# Patient Record
Sex: Female | Born: 1945 | Race: White | Hispanic: No | Smoking: Never smoker
Health system: Southern US, Community
[De-identification: ages and names within clinical notes are randomized; demographics above are authoritative.]

## PROBLEM LIST (undated history)

## (undated) DIAGNOSIS — F411 Generalized anxiety disorder: Secondary | ICD-10-CM

## (undated) DIAGNOSIS — F4321 Adjustment disorder with depressed mood: Secondary | ICD-10-CM

## (undated) DIAGNOSIS — R51 Headache: Secondary | ICD-10-CM

## (undated) DIAGNOSIS — G20A1 Parkinson's disease without dyskinesia, without mention of fluctuations: Secondary | ICD-10-CM

## (undated) DIAGNOSIS — F329 Major depressive disorder, single episode, unspecified: Secondary | ICD-10-CM

## (undated) DIAGNOSIS — G510 Bell's palsy: Secondary | ICD-10-CM

## (undated) DIAGNOSIS — G20 Parkinson's disease: Secondary | ICD-10-CM

## (undated) DIAGNOSIS — R2689 Other abnormalities of gait and mobility: Secondary | ICD-10-CM

## (undated) DIAGNOSIS — R569 Unspecified convulsions: Secondary | ICD-10-CM

## (undated) DIAGNOSIS — F419 Anxiety disorder, unspecified: Secondary | ICD-10-CM

## (undated) DIAGNOSIS — E782 Mixed hyperlipidemia: Secondary | ICD-10-CM

## (undated) DIAGNOSIS — I10 Essential (primary) hypertension: Secondary | ICD-10-CM

## (undated) DIAGNOSIS — E119 Type 2 diabetes mellitus without complications: Secondary | ICD-10-CM

## (undated) DIAGNOSIS — G4752 REM sleep behavior disorder: Secondary | ICD-10-CM

## (undated) DIAGNOSIS — E8881 Metabolic syndrome: Secondary | ICD-10-CM

## (undated) DIAGNOSIS — R55 Syncope and collapse: Secondary | ICD-10-CM

## (undated) DIAGNOSIS — Z8619 Personal history of other infectious and parasitic diseases: Secondary | ICD-10-CM

## (undated) DIAGNOSIS — Z8672 Personal history of thrombophlebitis: Secondary | ICD-10-CM

## (undated) DIAGNOSIS — I82402 Acute embolism and thrombosis of unspecified deep veins of left lower extremity: Secondary | ICD-10-CM

## (undated) DIAGNOSIS — R439 Unspecified disturbances of smell and taste: Secondary | ICD-10-CM

## (undated) DIAGNOSIS — F3289 Other specified depressive episodes: Secondary | ICD-10-CM

## (undated) HISTORY — DX: Metabolic syndrome: E88.81

## (undated) HISTORY — DX: REM sleep behavior disorder: G47.52

## (undated) HISTORY — PX: CATARACT EXTRACTION W/ INTRAOCULAR LENS  IMPLANT, BILATERAL: SHX1307

## (undated) HISTORY — DX: Type 2 diabetes mellitus without complications: E11.9

## (undated) HISTORY — DX: Metabolic syndrome: E88.810

## (undated) HISTORY — DX: Bell's palsy: G51.0

## (undated) HISTORY — PX: FRACTURE SURGERY: SHX138

## (undated) HISTORY — DX: Generalized anxiety disorder: F41.1

## (undated) HISTORY — DX: Morbid (severe) obesity due to excess calories: E66.01

## (undated) HISTORY — DX: Major depressive disorder, single episode, unspecified: F32.9

## (undated) HISTORY — DX: Adjustment disorder with depressed mood: F43.21

## (undated) HISTORY — DX: Essential (primary) hypertension: I10

## (undated) HISTORY — DX: Other specified depressive episodes: F32.89

## (undated) HISTORY — PX: TUBAL LIGATION: SHX77

## (undated) HISTORY — DX: Personal history of thrombophlebitis: Z86.72

## (undated) HISTORY — DX: Parkinson's disease without dyskinesia, without mention of fluctuations: G20.A1

## (undated) HISTORY — DX: Mixed hyperlipidemia: E78.2

---

## 1968-07-26 HISTORY — PX: TONSILLECTOMY AND ADENOIDECTOMY: SUR1326

## 1995-11-24 HISTORY — PX: OTHER SURGICAL HISTORY: SHX169

## 1998-04-21 ENCOUNTER — Encounter: Payer: Self-pay | Admitting: Family Medicine

## 1998-04-21 LAB — CONVERTED CEMR LAB: Pap Smear: NORMAL

## 1998-08-26 HISTORY — PX: SEPTOPLASTY: SUR1290

## 1998-09-08 ENCOUNTER — Ambulatory Visit (HOSPITAL_BASED_OUTPATIENT_CLINIC_OR_DEPARTMENT_OTHER): Admission: RE | Admit: 1998-09-08 | Discharge: 1998-09-08 | Payer: Self-pay | Admitting: *Deleted

## 1998-12-25 HISTORY — PX: BREAST CYST ASPIRATION: SHX578

## 1998-12-31 ENCOUNTER — Ambulatory Visit (HOSPITAL_COMMUNITY): Admission: RE | Admit: 1998-12-31 | Discharge: 1998-12-31 | Payer: Self-pay | Admitting: General Surgery

## 1999-11-20 ENCOUNTER — Encounter: Payer: Self-pay | Admitting: General Surgery

## 1999-11-20 ENCOUNTER — Encounter: Admission: RE | Admit: 1999-11-20 | Discharge: 1999-11-20 | Payer: Self-pay | Admitting: General Surgery

## 1999-11-26 ENCOUNTER — Encounter: Admission: RE | Admit: 1999-11-26 | Discharge: 1999-11-26 | Payer: Self-pay | Admitting: General Surgery

## 1999-11-26 ENCOUNTER — Encounter: Payer: Self-pay | Admitting: General Surgery

## 2000-05-19 ENCOUNTER — Ambulatory Visit (HOSPITAL_BASED_OUTPATIENT_CLINIC_OR_DEPARTMENT_OTHER): Admission: RE | Admit: 2000-05-19 | Discharge: 2000-05-19 | Payer: Self-pay | Admitting: General Surgery

## 2000-05-19 ENCOUNTER — Encounter (INDEPENDENT_AMBULATORY_CARE_PROVIDER_SITE_OTHER): Payer: Self-pay | Admitting: Specialist

## 2000-05-20 HISTORY — PX: BREAST BIOPSY: SHX20

## 2000-07-22 ENCOUNTER — Ambulatory Visit (HOSPITAL_BASED_OUTPATIENT_CLINIC_OR_DEPARTMENT_OTHER): Admission: RE | Admit: 2000-07-22 | Discharge: 2000-07-22 | Payer: Self-pay | Admitting: Pulmonary Disease

## 2000-11-21 ENCOUNTER — Encounter: Payer: Self-pay | Admitting: General Surgery

## 2000-11-21 ENCOUNTER — Encounter: Admission: RE | Admit: 2000-11-21 | Discharge: 2000-11-21 | Payer: Self-pay | Admitting: General Surgery

## 2001-06-15 ENCOUNTER — Encounter: Admission: RE | Admit: 2001-06-15 | Discharge: 2001-06-15 | Payer: Self-pay | Admitting: Otolaryngology

## 2001-06-15 ENCOUNTER — Encounter: Payer: Self-pay | Admitting: Otolaryngology

## 2001-09-28 ENCOUNTER — Encounter: Admission: RE | Admit: 2001-09-28 | Discharge: 2001-09-28 | Payer: Self-pay | Admitting: Family Medicine

## 2001-09-28 ENCOUNTER — Encounter: Payer: Self-pay | Admitting: Family Medicine

## 2001-10-06 ENCOUNTER — Observation Stay (HOSPITAL_COMMUNITY): Admission: EM | Admit: 2001-10-06 | Discharge: 2001-10-06 | Payer: Self-pay

## 2001-11-27 ENCOUNTER — Encounter: Admission: RE | Admit: 2001-11-27 | Discharge: 2001-11-27 | Payer: Self-pay | Admitting: Family Medicine

## 2001-11-27 ENCOUNTER — Encounter: Payer: Self-pay | Admitting: Family Medicine

## 2001-11-29 ENCOUNTER — Encounter: Admission: RE | Admit: 2001-11-29 | Discharge: 2001-11-29 | Payer: Self-pay | Admitting: Family Medicine

## 2001-11-29 ENCOUNTER — Encounter: Payer: Self-pay | Admitting: Family Medicine

## 2002-03-15 ENCOUNTER — Encounter: Payer: Self-pay | Admitting: Family Medicine

## 2002-03-15 ENCOUNTER — Ambulatory Visit (HOSPITAL_COMMUNITY): Admission: RE | Admit: 2002-03-15 | Discharge: 2002-03-15 | Payer: Self-pay | Admitting: Family Medicine

## 2002-07-26 HISTORY — PX: CARDIAC CATHETERIZATION: SHX172

## 2002-11-29 ENCOUNTER — Encounter: Payer: Self-pay | Admitting: Family Medicine

## 2002-11-29 ENCOUNTER — Encounter: Admission: RE | Admit: 2002-11-29 | Discharge: 2002-11-29 | Payer: Self-pay | Admitting: Family Medicine

## 2002-12-10 ENCOUNTER — Encounter: Admission: RE | Admit: 2002-12-10 | Discharge: 2002-12-10 | Payer: Self-pay | Admitting: Family Medicine

## 2002-12-10 ENCOUNTER — Encounter: Payer: Self-pay | Admitting: Family Medicine

## 2002-12-12 ENCOUNTER — Ambulatory Visit (HOSPITAL_BASED_OUTPATIENT_CLINIC_OR_DEPARTMENT_OTHER): Admission: RE | Admit: 2002-12-12 | Discharge: 2002-12-12 | Payer: Self-pay | Admitting: Orthopedic Surgery

## 2002-12-12 HISTORY — PX: KNEE ARTHROSCOPY: SUR90

## 2003-02-10 ENCOUNTER — Encounter: Payer: Self-pay | Admitting: Emergency Medicine

## 2003-02-10 ENCOUNTER — Encounter: Payer: Self-pay | Admitting: Internal Medicine

## 2003-02-10 ENCOUNTER — Inpatient Hospital Stay (HOSPITAL_COMMUNITY): Admission: EM | Admit: 2003-02-10 | Discharge: 2003-02-15 | Payer: Self-pay | Admitting: Emergency Medicine

## 2003-02-11 ENCOUNTER — Encounter: Payer: Self-pay | Admitting: Internal Medicine

## 2003-02-12 ENCOUNTER — Encounter: Payer: Self-pay | Admitting: Internal Medicine

## 2003-02-27 ENCOUNTER — Ambulatory Visit (HOSPITAL_COMMUNITY): Admission: RE | Admit: 2003-02-27 | Discharge: 2003-02-27 | Payer: Self-pay | Admitting: Internal Medicine

## 2003-03-01 ENCOUNTER — Ambulatory Visit (HOSPITAL_COMMUNITY): Admission: RE | Admit: 2003-03-01 | Discharge: 2003-03-01 | Payer: Self-pay | Admitting: Family Medicine

## 2003-03-01 ENCOUNTER — Inpatient Hospital Stay (HOSPITAL_COMMUNITY): Admission: AD | Admit: 2003-03-01 | Discharge: 2003-03-04 | Payer: Self-pay | Admitting: Family Medicine

## 2003-03-03 ENCOUNTER — Encounter: Payer: Self-pay | Admitting: Family Medicine

## 2003-03-13 ENCOUNTER — Emergency Department (HOSPITAL_COMMUNITY): Admission: EM | Admit: 2003-03-13 | Discharge: 2003-03-14 | Payer: Self-pay | Admitting: Emergency Medicine

## 2003-03-14 ENCOUNTER — Encounter: Payer: Self-pay | Admitting: Emergency Medicine

## 2003-05-27 ENCOUNTER — Encounter: Payer: Self-pay | Admitting: Family Medicine

## 2003-05-27 LAB — CONVERTED CEMR LAB
Hgb A1c MFr Bld: 5.8 %
Microalbumin U total vol: 4.7 mg/L
Pap Smear: NORMAL

## 2003-06-24 ENCOUNTER — Other Ambulatory Visit: Admission: RE | Admit: 2003-06-24 | Discharge: 2003-06-24 | Payer: Self-pay | Admitting: Family Medicine

## 2003-12-11 ENCOUNTER — Encounter: Admission: RE | Admit: 2003-12-11 | Discharge: 2003-12-11 | Payer: Self-pay | Admitting: Family Medicine

## 2004-05-26 ENCOUNTER — Encounter: Payer: Self-pay | Admitting: Family Medicine

## 2004-05-26 LAB — CONVERTED CEMR LAB: Microalbumin U total vol: 11.5 mg/L

## 2004-06-01 ENCOUNTER — Ambulatory Visit: Payer: Self-pay | Admitting: Family Medicine

## 2004-06-01 ENCOUNTER — Ambulatory Visit: Payer: Self-pay

## 2004-06-03 ENCOUNTER — Ambulatory Visit: Payer: Self-pay | Admitting: Cardiology

## 2004-06-25 ENCOUNTER — Encounter: Payer: Self-pay | Admitting: Family Medicine

## 2004-06-25 LAB — CONVERTED CEMR LAB: Hgb A1c MFr Bld: 5.9 %

## 2004-07-01 ENCOUNTER — Ambulatory Visit: Payer: Self-pay | Admitting: *Deleted

## 2004-07-06 ENCOUNTER — Ambulatory Visit: Payer: Self-pay | Admitting: Family Medicine

## 2004-07-08 ENCOUNTER — Ambulatory Visit: Payer: Self-pay | Admitting: Family Medicine

## 2004-07-10 ENCOUNTER — Ambulatory Visit: Payer: Self-pay | Admitting: Cardiology

## 2004-07-23 ENCOUNTER — Ambulatory Visit: Payer: Self-pay | Admitting: Cardiovascular Disease

## 2004-08-05 ENCOUNTER — Ambulatory Visit: Payer: Self-pay | Admitting: Internal Medicine

## 2004-08-19 ENCOUNTER — Ambulatory Visit: Payer: Self-pay | Admitting: Cardiology

## 2004-08-25 ENCOUNTER — Ambulatory Visit: Payer: Self-pay | Admitting: Family Medicine

## 2004-09-02 ENCOUNTER — Ambulatory Visit: Payer: Self-pay | Admitting: Internal Medicine

## 2004-09-08 ENCOUNTER — Encounter: Admission: RE | Admit: 2004-09-08 | Discharge: 2004-09-08 | Payer: Self-pay | Admitting: Orthopedic Surgery

## 2004-09-24 ENCOUNTER — Encounter: Admission: RE | Admit: 2004-09-24 | Discharge: 2004-12-23 | Payer: Self-pay | Admitting: Family Medicine

## 2004-10-06 ENCOUNTER — Ambulatory Visit: Payer: Self-pay | Admitting: Cardiology

## 2004-11-11 ENCOUNTER — Ambulatory Visit: Payer: Self-pay | Admitting: Cardiology

## 2004-12-09 ENCOUNTER — Ambulatory Visit: Payer: Self-pay | Admitting: Cardiology

## 2004-12-14 ENCOUNTER — Encounter: Admission: RE | Admit: 2004-12-14 | Discharge: 2004-12-14 | Payer: Self-pay | Admitting: Family Medicine

## 2004-12-30 ENCOUNTER — Ambulatory Visit: Payer: Self-pay | Admitting: Cardiology

## 2005-01-07 ENCOUNTER — Ambulatory Visit: Payer: Self-pay | Admitting: Internal Medicine

## 2005-01-13 ENCOUNTER — Ambulatory Visit (HOSPITAL_COMMUNITY): Admission: RE | Admit: 2005-01-13 | Discharge: 2005-01-13 | Payer: Self-pay | Admitting: Internal Medicine

## 2005-01-13 ENCOUNTER — Ambulatory Visit: Payer: Self-pay | Admitting: Internal Medicine

## 2005-04-14 ENCOUNTER — Ambulatory Visit: Payer: Self-pay | Admitting: Family Medicine

## 2005-04-24 ENCOUNTER — Encounter: Admission: RE | Admit: 2005-04-24 | Discharge: 2005-04-24 | Payer: Self-pay | Admitting: Orthopedic Surgery

## 2005-05-21 ENCOUNTER — Ambulatory Visit: Payer: Self-pay | Admitting: Family Medicine

## 2005-05-26 ENCOUNTER — Ambulatory Visit: Payer: Self-pay | Admitting: Family Medicine

## 2005-05-28 ENCOUNTER — Ambulatory Visit: Payer: Self-pay | Admitting: Family Medicine

## 2005-06-03 ENCOUNTER — Ambulatory Visit: Payer: Self-pay | Admitting: Family Medicine

## 2005-06-08 ENCOUNTER — Other Ambulatory Visit: Payer: Self-pay

## 2005-06-11 ENCOUNTER — Ambulatory Visit: Payer: Self-pay | Admitting: Podiatry

## 2005-07-01 ENCOUNTER — Ambulatory Visit: Payer: Self-pay | Admitting: Family Medicine

## 2005-07-26 ENCOUNTER — Encounter: Payer: Self-pay | Admitting: Family Medicine

## 2005-07-26 LAB — CONVERTED CEMR LAB
Hgb A1c MFr Bld: 5.8 %
Microalbumin U total vol: 5.6 mg/L

## 2005-08-04 ENCOUNTER — Ambulatory Visit: Payer: Self-pay | Admitting: Family Medicine

## 2005-08-10 ENCOUNTER — Ambulatory Visit: Payer: Self-pay | Admitting: Family Medicine

## 2005-08-19 ENCOUNTER — Ambulatory Visit: Payer: Self-pay | Admitting: Family Medicine

## 2005-09-08 ENCOUNTER — Ambulatory Visit: Payer: Self-pay | Admitting: Family Medicine

## 2005-09-10 ENCOUNTER — Ambulatory Visit (HOSPITAL_COMMUNITY): Admission: RE | Admit: 2005-09-10 | Discharge: 2005-09-10 | Payer: Self-pay | Admitting: Urology

## 2005-09-24 ENCOUNTER — Ambulatory Visit: Payer: Self-pay | Admitting: Family Medicine

## 2005-10-15 ENCOUNTER — Ambulatory Visit: Payer: Self-pay | Admitting: Family Medicine

## 2005-10-24 ENCOUNTER — Encounter: Payer: Self-pay | Admitting: Family Medicine

## 2005-10-24 LAB — CONVERTED CEMR LAB: Hgb A1c MFr Bld: 5.9 %

## 2005-11-09 ENCOUNTER — Ambulatory Visit: Payer: Self-pay | Admitting: Family Medicine

## 2005-11-11 ENCOUNTER — Ambulatory Visit: Payer: Self-pay | Admitting: Family Medicine

## 2005-12-29 ENCOUNTER — Ambulatory Visit: Payer: Self-pay | Admitting: Cardiology

## 2005-12-30 ENCOUNTER — Encounter: Admission: RE | Admit: 2005-12-30 | Discharge: 2005-12-30 | Payer: Self-pay | Admitting: Family Medicine

## 2006-01-07 ENCOUNTER — Ambulatory Visit: Payer: Self-pay | Admitting: Family Medicine

## 2006-01-10 ENCOUNTER — Ambulatory Visit: Payer: Self-pay

## 2006-01-23 ENCOUNTER — Encounter: Payer: Self-pay | Admitting: Family Medicine

## 2006-01-23 LAB — CONVERTED CEMR LAB: Hgb A1c MFr Bld: 5.6 %

## 2006-02-08 ENCOUNTER — Ambulatory Visit: Payer: Self-pay | Admitting: Family Medicine

## 2006-02-09 HISTORY — PX: FRACTURE SURGERY: SHX138

## 2006-02-10 ENCOUNTER — Ambulatory Visit: Payer: Self-pay | Admitting: Family Medicine

## 2006-04-25 ENCOUNTER — Encounter: Payer: Self-pay | Admitting: Family Medicine

## 2006-04-25 LAB — CONVERTED CEMR LAB: Hgb A1c MFr Bld: 5.7 %

## 2006-05-06 ENCOUNTER — Ambulatory Visit: Payer: Self-pay | Admitting: Family Medicine

## 2006-05-10 ENCOUNTER — Ambulatory Visit: Payer: Self-pay | Admitting: Family Medicine

## 2006-05-12 ENCOUNTER — Ambulatory Visit: Payer: Self-pay | Admitting: Family Medicine

## 2006-07-22 ENCOUNTER — Ambulatory Visit (HOSPITAL_COMMUNITY): Admission: RE | Admit: 2006-07-22 | Discharge: 2006-07-22 | Payer: Self-pay | Admitting: General Surgery

## 2006-07-26 ENCOUNTER — Encounter: Payer: Self-pay | Admitting: Family Medicine

## 2006-07-26 HISTORY — PX: LAPAROSCOPIC GASTRIC BANDING: SHX1100

## 2006-07-26 LAB — CONVERTED CEMR LAB
Hgb A1c MFr Bld: 5.8 %
Microalbumin U total vol: 8.9 mg/L

## 2006-07-27 ENCOUNTER — Encounter: Admission: RE | Admit: 2006-07-27 | Discharge: 2006-10-25 | Payer: Self-pay | Admitting: General Surgery

## 2006-07-29 ENCOUNTER — Ambulatory Visit (HOSPITAL_COMMUNITY): Admission: RE | Admit: 2006-07-29 | Discharge: 2006-07-29 | Payer: Self-pay | Admitting: General Surgery

## 2006-08-11 ENCOUNTER — Ambulatory Visit: Payer: Self-pay | Admitting: Family Medicine

## 2006-08-11 LAB — CONVERTED CEMR LAB
ALT: 17 units/L (ref 0–40)
AST: 18 units/L (ref 0–37)
Albumin: 3.5 g/dL (ref 3.5–5.2)
Alkaline Phosphatase: 71 units/L (ref 39–117)
BUN: 18 mg/dL (ref 6–23)
CO2: 33 meq/L — ABNORMAL HIGH (ref 19–32)
Calcium: 10.1 mg/dL (ref 8.4–10.5)
Chloride: 102 meq/L (ref 96–112)
Cholesterol: 138 mg/dL (ref 0–200)
Creatinine, Ser: 0.6 mg/dL (ref 0.4–1.2)
Creatinine,U: 102.6 mg/dL
GFR calc Af Amer: 131 mL/min
GFR calc non Af Amer: 108 mL/min
Glucose, Bld: 95 mg/dL (ref 70–99)
HCT: 38 % (ref 36.0–46.0)
HDL: 52.4 mg/dL (ref >39.0)
Hemoglobin: 12.2 g/dL (ref 12.0–15.0)
Hgb A1c MFr Bld: 5.8 % (ref 4.6–6.0)
LDL Cholesterol: 67 mg/dL (ref 0–99)
MCHC: 32.1 g/dL (ref 30.0–36.0)
MCV: 91.7 fL (ref 78.0–100.0)
Microalb Creat Ratio: 2.9 mg/g (ref 0.0–30.0)
Microalb, Ur: 0.3 mg/dL (ref 0.0–1.9)
Platelets: 302 10*3/uL (ref 150–400)
Potassium: 4.4 meq/L (ref 3.5–5.1)
RBC: 4.14 M/uL (ref 3.87–5.11)
RDW: 14.1 % (ref 11.5–14.6)
Sodium: 141 meq/L (ref 135–145)
TSH: 1.36 microintl units/mL (ref 0.35–5.50)
Total Bilirubin: 0.7 mg/dL (ref 0.3–1.2)
Total CHOL/HDL Ratio: 2.6
Total Protein: 6.1 g/dL (ref 6.0–8.3)
Triglycerides: 91 mg/dL (ref 0–149)
VLDL: 18 mg/dL (ref 0–40)
WBC: 6.4 10*3/uL (ref 4.5–10.5)

## 2006-08-15 ENCOUNTER — Ambulatory Visit: Payer: Self-pay | Admitting: Family Medicine

## 2006-11-11 ENCOUNTER — Ambulatory Visit: Payer: Self-pay | Admitting: Family Medicine

## 2006-11-11 LAB — CONVERTED CEMR LAB: Hgb A1c MFr Bld: 5.8 % (ref 4.6–6.0)

## 2006-11-15 ENCOUNTER — Ambulatory Visit: Payer: Self-pay | Admitting: Family Medicine

## 2006-11-16 ENCOUNTER — Encounter: Payer: Self-pay | Admitting: Family Medicine

## 2006-11-16 DIAGNOSIS — T7840XA Allergy, unspecified, initial encounter: Secondary | ICD-10-CM | POA: Insufficient documentation

## 2007-01-02 ENCOUNTER — Ambulatory Visit: Payer: Self-pay | Admitting: Cardiology

## 2007-01-02 ENCOUNTER — Encounter: Admission: RE | Admit: 2007-01-02 | Discharge: 2007-01-02 | Payer: Self-pay | Admitting: Family Medicine

## 2007-01-09 ENCOUNTER — Encounter (INDEPENDENT_AMBULATORY_CARE_PROVIDER_SITE_OTHER): Payer: Self-pay | Admitting: *Deleted

## 2007-01-23 ENCOUNTER — Encounter: Admission: RE | Admit: 2007-01-23 | Discharge: 2007-04-23 | Payer: Self-pay | Admitting: General Surgery

## 2007-01-24 ENCOUNTER — Encounter (INDEPENDENT_AMBULATORY_CARE_PROVIDER_SITE_OTHER): Payer: Self-pay | Admitting: *Deleted

## 2007-01-31 ENCOUNTER — Ambulatory Visit: Payer: Self-pay | Admitting: Family Medicine

## 2007-01-31 DIAGNOSIS — I10 Essential (primary) hypertension: Secondary | ICD-10-CM

## 2007-01-31 DIAGNOSIS — E782 Mixed hyperlipidemia: Secondary | ICD-10-CM | POA: Insufficient documentation

## 2007-01-31 DIAGNOSIS — F329 Major depressive disorder, single episode, unspecified: Secondary | ICD-10-CM | POA: Insufficient documentation

## 2007-01-31 DIAGNOSIS — F3289 Other specified depressive episodes: Secondary | ICD-10-CM | POA: Insufficient documentation

## 2007-01-31 DIAGNOSIS — O24410 Gestational diabetes mellitus in pregnancy, diet controlled: Secondary | ICD-10-CM | POA: Insufficient documentation

## 2007-01-31 HISTORY — DX: Essential (primary) hypertension: I10

## 2007-01-31 HISTORY — DX: Mixed hyperlipidemia: E78.2

## 2007-02-06 ENCOUNTER — Ambulatory Visit (HOSPITAL_COMMUNITY): Admission: RE | Admit: 2007-02-06 | Discharge: 2007-02-07 | Payer: Self-pay | Admitting: General Surgery

## 2007-02-16 ENCOUNTER — Ambulatory Visit: Payer: Self-pay | Admitting: Family Medicine

## 2007-02-16 HISTORY — DX: Morbid (severe) obesity due to excess calories: E66.01

## 2007-02-24 ENCOUNTER — Ambulatory Visit: Payer: Self-pay | Admitting: Family Medicine

## 2007-03-24 ENCOUNTER — Ambulatory Visit: Payer: Self-pay | Admitting: Family Medicine

## 2007-03-24 DIAGNOSIS — F4321 Adjustment disorder with depressed mood: Secondary | ICD-10-CM | POA: Insufficient documentation

## 2007-03-29 ENCOUNTER — Encounter: Admission: RE | Admit: 2007-03-29 | Discharge: 2007-03-29 | Payer: Self-pay | Admitting: General Surgery

## 2007-05-01 ENCOUNTER — Telehealth (INDEPENDENT_AMBULATORY_CARE_PROVIDER_SITE_OTHER): Payer: Self-pay | Admitting: *Deleted

## 2007-05-04 ENCOUNTER — Ambulatory Visit: Payer: Self-pay | Admitting: Family Medicine

## 2007-05-04 LAB — CONVERTED CEMR LAB
ALT: 15 units/L (ref 0–35)
AST: 17 units/L (ref 0–37)
Basophils Absolute: 0 10*3/uL (ref 0.0–0.1)
Basophils Relative: 0.6 % (ref 0.0–1.0)
Eosinophils Absolute: 0.3 10*3/uL (ref 0.0–0.6)
Eosinophils Relative: 4 % (ref 0.0–5.0)
HCT: 38.3 % (ref 36.0–46.0)
Hemoglobin: 12.8 g/dL (ref 12.0–15.0)
Hgb A1c MFr Bld: 5.5 % (ref 4.6–6.0)
Lymphocytes Relative: 21.8 % (ref 12.0–46.0)
MCHC: 33.6 g/dL (ref 30.0–36.0)
MCV: 92.4 fL (ref 78.0–100.0)
Monocytes Absolute: 0.5 10*3/uL (ref 0.2–0.7)
Monocytes Relative: 7.3 % (ref 3.0–11.0)
Neutro Abs: 4.4 10*3/uL (ref 1.4–7.7)
Neutrophils Relative %: 66.3 % (ref 43.0–77.0)
Platelets: 240 10*3/uL (ref 150–400)
RBC: 4.14 M/uL (ref 3.87–5.11)
RDW: 13.7 % (ref 11.5–14.6)
WBC: 6.6 10*3/uL (ref 4.5–10.5)

## 2007-05-08 ENCOUNTER — Ambulatory Visit: Payer: Self-pay | Admitting: Family Medicine

## 2007-05-24 ENCOUNTER — Encounter: Payer: Self-pay | Admitting: Family Medicine

## 2007-06-08 ENCOUNTER — Telehealth (INDEPENDENT_AMBULATORY_CARE_PROVIDER_SITE_OTHER): Payer: Self-pay | Admitting: *Deleted

## 2007-06-09 ENCOUNTER — Encounter: Admission: RE | Admit: 2007-06-09 | Discharge: 2007-06-09 | Payer: Self-pay | Admitting: General Surgery

## 2007-06-19 ENCOUNTER — Ambulatory Visit: Payer: Self-pay | Admitting: Family Medicine

## 2007-06-20 ENCOUNTER — Telehealth: Payer: Self-pay | Admitting: Family Medicine

## 2007-06-29 ENCOUNTER — Telehealth: Payer: Self-pay | Admitting: Family Medicine

## 2007-07-18 ENCOUNTER — Ambulatory Visit: Payer: Self-pay | Admitting: Family Medicine

## 2007-07-21 ENCOUNTER — Telehealth: Payer: Self-pay | Admitting: Family Medicine

## 2007-08-11 ENCOUNTER — Ambulatory Visit: Payer: Self-pay | Admitting: Family Medicine

## 2007-08-13 LAB — CONVERTED CEMR LAB
ALT: 16 units/L (ref 0–35)
AST: 17 units/L (ref 0–37)
Albumin: 3.9 g/dL (ref 3.5–5.2)
Alkaline Phosphatase: 51 units/L (ref 39–117)
BUN: 22 mg/dL (ref 6–23)
Basophils Absolute: 0 10*3/uL (ref 0.0–0.1)
Basophils Relative: 0.2 % (ref 0.0–1.0)
Bilirubin, Direct: 0.1 mg/dL (ref 0.0–0.3)
CO2: 33 meq/L — ABNORMAL HIGH (ref 19–32)
Calcium: 10.8 mg/dL — ABNORMAL HIGH (ref 8.4–10.5)
Chloride: 104 meq/L (ref 96–112)
Cholesterol: 159 mg/dL (ref 0–200)
Creatinine, Ser: 0.8 mg/dL (ref 0.4–1.2)
Creatinine,U: 60.7 mg/dL
Eosinophils Absolute: 0.5 10*3/uL (ref 0.0–0.6)
Eosinophils Relative: 6.8 % — ABNORMAL HIGH (ref 0.0–5.0)
GFR calc Af Amer: 93 mL/min
GFR calc non Af Amer: 77 mL/min
Glucose, Bld: 91 mg/dL (ref 70–99)
HCT: 37.9 % (ref 36.0–46.0)
HDL: 61 mg/dL (ref >39.0)
Hemoglobin: 13.2 g/dL (ref 12.0–15.0)
Hgb A1c MFr Bld: 5.3 % (ref 4.6–6.0)
Iron: 79 ug/dL (ref 42–145)
LDL Cholesterol: 83 mg/dL (ref 0–99)
Lymphocytes Relative: 19.4 % (ref 12.0–46.0)
MCHC: 34.9 g/dL (ref 30.0–36.0)
MCV: 93.9 fL (ref 78.0–100.0)
Microalb Creat Ratio: 13.2 mg/g (ref 0.0–30.0)
Microalb, Ur: 0.8 mg/dL (ref 0.0–1.9)
Monocytes Absolute: 0.5 10*3/uL (ref 0.2–0.7)
Monocytes Relative: 6.7 % (ref 3.0–11.0)
Neutro Abs: 4.6 10*3/uL (ref 1.4–7.7)
Neutrophils Relative %: 66.9 % (ref 43.0–77.0)
Platelets: 284 10*3/uL (ref 150–400)
Potassium: 4.6 meq/L (ref 3.5–5.1)
RBC: 4.04 M/uL (ref 3.87–5.11)
RDW: 12.5 % (ref 11.5–14.6)
Sodium: 142 meq/L (ref 135–145)
TSH: 1.53 microintl units/mL (ref 0.35–5.50)
Total Bilirubin: 0.8 mg/dL (ref 0.3–1.2)
Total CHOL/HDL Ratio: 2.6
Total Protein: 6.5 g/dL (ref 6.0–8.3)
Triglycerides: 77 mg/dL (ref 0–149)
VLDL: 15 mg/dL (ref 0–40)
WBC: 6.9 10*3/uL (ref 4.5–10.5)

## 2007-08-22 ENCOUNTER — Ambulatory Visit: Payer: Self-pay | Admitting: Family Medicine

## 2007-08-24 ENCOUNTER — Encounter: Admission: RE | Admit: 2007-08-24 | Discharge: 2007-08-24 | Payer: Self-pay | Admitting: General Surgery

## 2007-08-24 ENCOUNTER — Encounter: Payer: Self-pay | Admitting: Family Medicine

## 2007-08-31 ENCOUNTER — Ambulatory Visit: Payer: Self-pay | Admitting: Family Medicine

## 2007-09-01 ENCOUNTER — Encounter (INDEPENDENT_AMBULATORY_CARE_PROVIDER_SITE_OTHER): Payer: Self-pay | Admitting: *Deleted

## 2007-09-18 ENCOUNTER — Ambulatory Visit: Payer: Self-pay | Admitting: Family Medicine

## 2007-10-23 ENCOUNTER — Telehealth: Payer: Self-pay | Admitting: Family Medicine

## 2007-11-11 ENCOUNTER — Emergency Department (HOSPITAL_COMMUNITY): Admission: EM | Admit: 2007-11-11 | Discharge: 2007-11-12 | Payer: Self-pay | Admitting: Emergency Medicine

## 2007-11-14 ENCOUNTER — Telehealth: Payer: Self-pay | Admitting: Family Medicine

## 2007-11-21 ENCOUNTER — Encounter: Payer: Self-pay | Admitting: Family Medicine

## 2007-11-21 ENCOUNTER — Encounter: Admission: RE | Admit: 2007-11-21 | Discharge: 2007-11-21 | Payer: Self-pay | Admitting: General Surgery

## 2007-11-22 ENCOUNTER — Encounter: Payer: Self-pay | Admitting: Family Medicine

## 2007-12-04 ENCOUNTER — Telehealth: Payer: Self-pay | Admitting: Family Medicine

## 2007-12-21 ENCOUNTER — Ambulatory Visit: Payer: Self-pay

## 2008-01-04 ENCOUNTER — Ambulatory Visit: Payer: Self-pay | Admitting: Cardiology

## 2008-01-05 ENCOUNTER — Encounter: Admission: RE | Admit: 2008-01-05 | Discharge: 2008-01-05 | Payer: Self-pay | Admitting: Family Medicine

## 2008-01-05 ENCOUNTER — Encounter (INDEPENDENT_AMBULATORY_CARE_PROVIDER_SITE_OTHER): Payer: Self-pay | Admitting: *Deleted

## 2008-02-07 ENCOUNTER — Telehealth (INDEPENDENT_AMBULATORY_CARE_PROVIDER_SITE_OTHER): Payer: Self-pay | Admitting: *Deleted

## 2008-02-14 ENCOUNTER — Ambulatory Visit: Payer: Self-pay | Admitting: Family Medicine

## 2008-02-14 LAB — CONVERTED CEMR LAB
Basophils Absolute: 0 10*3/uL (ref 0.0–0.1)
Basophils Relative: 0.1 % (ref 0.0–3.0)
Eosinophils Absolute: 0.2 10*3/uL (ref 0.0–0.7)
Eosinophils Relative: 3.8 % (ref 0.0–5.0)
Ferritin: 75.3 ng/mL (ref 10.0–291.0)
HCT: 38.7 % (ref 36.0–46.0)
Hemoglobin: 13.6 g/dL (ref 12.0–15.0)
Hgb A1c MFr Bld: 5.5 % (ref 4.6–6.0)
Iron: 129 ug/dL (ref 42–145)
Lymphocytes Relative: 27.6 % (ref 12.0–46.0)
MCHC: 35.2 g/dL (ref 30.0–36.0)
MCV: 93.6 fL (ref 78.0–100.0)
Monocytes Absolute: 0.6 10*3/uL (ref 0.1–1.0)
Monocytes Relative: 9.6 % (ref 3.0–12.0)
Neutro Abs: 3.7 10*3/uL (ref 1.4–7.7)
Neutrophils Relative %: 58.9 % (ref 43.0–77.0)
Platelets: 238 10*3/uL (ref 150–400)
RBC: 4.13 M/uL (ref 3.87–5.11)
RDW: 12 % (ref 11.5–14.6)
Vitamin B-12: >1500 pg/mL — ABNORMAL HIGH (ref 211–911)
WBC: 6.2 10*3/uL (ref 4.5–10.5)

## 2008-02-19 ENCOUNTER — Encounter: Admission: RE | Admit: 2008-02-19 | Discharge: 2008-04-16 | Payer: Self-pay | Admitting: General Surgery

## 2008-02-19 ENCOUNTER — Ambulatory Visit: Payer: Self-pay | Admitting: Family Medicine

## 2008-03-21 ENCOUNTER — Encounter: Payer: Self-pay | Admitting: Family Medicine

## 2008-05-28 ENCOUNTER — Telehealth: Payer: Self-pay | Admitting: Family Medicine

## 2008-06-13 ENCOUNTER — Encounter: Payer: Self-pay | Admitting: Family Medicine

## 2008-08-16 ENCOUNTER — Encounter: Payer: Self-pay | Admitting: Family Medicine

## 2008-08-20 ENCOUNTER — Ambulatory Visit: Payer: Self-pay | Admitting: Family Medicine

## 2008-08-20 LAB — CONVERTED CEMR LAB
ALT: 17 units/L (ref 0–35)
AST: 18 units/L (ref 0–37)
Albumin: 3.9 g/dL (ref 3.5–5.2)
Alkaline Phosphatase: 54 units/L (ref 39–117)
BUN: 17 mg/dL (ref 6–23)
Basophils Absolute: 0 10*3/uL (ref 0.0–0.1)
Basophils Relative: 0.5 % (ref 0.0–3.0)
Bilirubin, Direct: 0.1 mg/dL (ref 0.0–0.3)
CO2: 29 meq/L (ref 19–32)
Calcium: 10.1 mg/dL (ref 8.4–10.5)
Chloride: 104 meq/L (ref 96–112)
Cholesterol: 135 mg/dL (ref 0–200)
Creatinine, Ser: 0.8 mg/dL (ref 0.4–1.2)
Creatinine,U: 120.2 mg/dL
Eosinophils Absolute: 0.2 10*3/uL (ref 0.0–0.7)
Eosinophils Relative: 3 % (ref 0.0–5.0)
GFR calc Af Amer: 93 mL/min
GFR calc non Af Amer: 77 mL/min
Glucose, Bld: 76 mg/dL (ref 70–99)
HCT: 43.5 % (ref 36.0–46.0)
HDL: 60.4 mg/dL (ref >39.0)
Hemoglobin: 14.7 g/dL (ref 12.0–15.0)
Hgb A1c MFr Bld: 5.6 % (ref 4.6–6.0)
LDL Cholesterol: 56 mg/dL (ref 0–99)
Lymphocytes Relative: 36.6 % (ref 12.0–46.0)
MCHC: 33.8 g/dL (ref 30.0–36.0)
MCV: 93.5 fL (ref 78.0–100.0)
Microalb Creat Ratio: 15.8 mg/g (ref 0.0–30.0)
Microalb, Ur: 1.9 mg/dL (ref 0.0–1.9)
Monocytes Absolute: 0.7 10*3/uL (ref 0.1–1.0)
Monocytes Relative: 10.9 % (ref 3.0–12.0)
Neutro Abs: 3.3 10*3/uL (ref 1.4–7.7)
Neutrophils Relative %: 49 % (ref 43.0–77.0)
Platelets: 236 10*3/uL (ref 150–400)
Potassium: 4.2 meq/L (ref 3.5–5.1)
RBC: 4.65 M/uL (ref 3.87–5.11)
RDW: 11.9 % (ref 11.5–14.6)
Sodium: 141 meq/L (ref 135–145)
TSH: 2 microintl units/mL (ref 0.35–5.50)
Total Bilirubin: 0.7 mg/dL (ref 0.3–1.2)
Total CHOL/HDL Ratio: 2.2
Total Protein: 6.4 g/dL (ref 6.0–8.3)
Triglycerides: 93 mg/dL (ref 0–149)
VLDL: 19 mg/dL (ref 0–40)
WBC: 6.7 10*3/uL (ref 4.5–10.5)

## 2008-08-21 LAB — CONVERTED CEMR LAB: Vit D, 1,25-Dihydroxy: 37 (ref 30–89)

## 2008-08-29 ENCOUNTER — Other Ambulatory Visit: Admission: RE | Admit: 2008-08-29 | Discharge: 2008-08-29 | Payer: Self-pay | Admitting: Family Medicine

## 2008-08-29 ENCOUNTER — Ambulatory Visit: Payer: Self-pay | Admitting: Family Medicine

## 2008-08-29 ENCOUNTER — Encounter: Payer: Self-pay | Admitting: Family Medicine

## 2008-08-29 LAB — CONVERTED CEMR LAB: Pap Smear: NORMAL

## 2008-09-02 ENCOUNTER — Encounter (INDEPENDENT_AMBULATORY_CARE_PROVIDER_SITE_OTHER): Payer: Self-pay | Admitting: *Deleted

## 2008-09-16 ENCOUNTER — Ambulatory Visit: Payer: Self-pay | Admitting: Family Medicine

## 2008-09-16 LAB — CONVERTED CEMR LAB
OCCULT 1: NEGATIVE
OCCULT 2: NEGATIVE
OCCULT 3: NEGATIVE

## 2008-09-17 ENCOUNTER — Encounter (INDEPENDENT_AMBULATORY_CARE_PROVIDER_SITE_OTHER): Payer: Self-pay | Admitting: *Deleted

## 2008-10-29 ENCOUNTER — Telehealth: Payer: Self-pay | Admitting: Family Medicine

## 2008-11-25 ENCOUNTER — Ambulatory Visit: Payer: Self-pay | Admitting: Family Medicine

## 2008-11-28 ENCOUNTER — Encounter: Payer: Self-pay | Admitting: Family Medicine

## 2009-01-06 ENCOUNTER — Encounter: Admission: RE | Admit: 2009-01-06 | Discharge: 2009-01-06 | Payer: Self-pay | Admitting: Family Medicine

## 2009-01-08 ENCOUNTER — Encounter (INDEPENDENT_AMBULATORY_CARE_PROVIDER_SITE_OTHER): Payer: Self-pay | Admitting: *Deleted

## 2009-02-25 ENCOUNTER — Ambulatory Visit: Payer: Self-pay | Admitting: Cardiology

## 2009-02-25 DIAGNOSIS — E8881 Metabolic syndrome: Secondary | ICD-10-CM | POA: Insufficient documentation

## 2009-03-10 ENCOUNTER — Ambulatory Visit: Payer: Self-pay | Admitting: Family Medicine

## 2009-03-10 LAB — CONVERTED CEMR LAB: Hgb A1c MFr Bld: 5.6 % (ref 4.6–6.5)

## 2009-03-13 ENCOUNTER — Ambulatory Visit: Payer: Self-pay | Admitting: Family Medicine

## 2009-09-01 ENCOUNTER — Ambulatory Visit: Payer: Self-pay | Admitting: Family Medicine

## 2009-09-01 LAB — CONVERTED CEMR LAB
ALT: 17 units/L (ref 0–35)
AST: 16 units/L (ref 0–37)
Albumin: 3.9 g/dL (ref 3.5–5.2)
Alkaline Phosphatase: 55 units/L (ref 39–117)
BUN: 23 mg/dL (ref 6–23)
Basophils Absolute: 0.1 10*3/uL (ref 0.0–0.1)
Basophils Relative: 0.9 % (ref 0.0–3.0)
Bilirubin, Direct: 0 mg/dL (ref 0.0–0.3)
CO2: 33 meq/L — ABNORMAL HIGH (ref 19–32)
Calcium: 10.3 mg/dL (ref 8.4–10.5)
Chloride: 107 meq/L (ref 96–112)
Cholesterol: 149 mg/dL (ref 0–200)
Creatinine, Ser: 0.8 mg/dL (ref 0.4–1.2)
Creatinine,U: 176.9 mg/dL
Eosinophils Absolute: 0.2 10*3/uL (ref 0.0–0.7)
Eosinophils Relative: 3.5 % (ref 0.0–5.0)
GFR calc non Af Amer: 76.59 mL/min (ref >60)
Glucose, Bld: 79 mg/dL (ref 70–99)
HCT: 37.7 % (ref 36.0–46.0)
HDL: 65 mg/dL (ref >39.00)
Hemoglobin: 12.8 g/dL (ref 12.0–15.0)
Hgb A1c MFr Bld: 5.7 % (ref 4.6–6.5)
LDL Cholesterol: 63 mg/dL (ref 0–99)
Lymphocytes Relative: 30.7 % (ref 12.0–46.0)
Lymphs Abs: 2.2 10*3/uL (ref 0.7–4.0)
MCHC: 34 g/dL (ref 30.0–36.0)
MCV: 93.7 fL (ref 78.0–100.0)
Microalb Creat Ratio: 7.3 mg/g (ref 0.0–30.0)
Microalb, Ur: 1.3 mg/dL (ref 0.0–1.9)
Monocytes Absolute: 0.6 10*3/uL (ref 0.1–1.0)
Monocytes Relative: 8.5 % (ref 3.0–12.0)
Neutro Abs: 4 10*3/uL (ref 1.4–7.7)
Neutrophils Relative %: 56.4 % (ref 43.0–77.0)
Platelets: 209 10*3/uL (ref 150.0–400.0)
Potassium: 3.8 meq/L (ref 3.5–5.1)
RBC: 4.03 M/uL (ref 3.87–5.11)
RDW: 12.7 % (ref 11.5–14.6)
Sodium: 144 meq/L (ref 135–145)
TSH: 1.51 microintl units/mL (ref 0.35–5.50)
Total Bilirubin: 0.5 mg/dL (ref 0.3–1.2)
Total CHOL/HDL Ratio: 2
Total Protein: 6.4 g/dL (ref 6.0–8.3)
Triglycerides: 104 mg/dL (ref 0.0–149.0)
VLDL: 20.8 mg/dL (ref 0.0–40.0)
WBC: 7.1 10*3/uL (ref 4.5–10.5)

## 2009-09-02 LAB — CONVERTED CEMR LAB: Vit D, 25-Hydroxy: 47 ng/mL (ref 30–89)

## 2009-09-05 ENCOUNTER — Telehealth: Payer: Self-pay | Admitting: Family Medicine

## 2009-09-05 ENCOUNTER — Ambulatory Visit: Payer: Self-pay

## 2009-09-05 ENCOUNTER — Ambulatory Visit: Payer: Self-pay | Admitting: Family Medicine

## 2009-09-05 DIAGNOSIS — M79609 Pain in unspecified limb: Secondary | ICD-10-CM | POA: Insufficient documentation

## 2009-09-08 ENCOUNTER — Ambulatory Visit: Payer: Self-pay | Admitting: Vascular Surgery

## 2009-09-08 ENCOUNTER — Observation Stay (HOSPITAL_COMMUNITY): Admission: EM | Admit: 2009-09-08 | Discharge: 2009-09-10 | Payer: Self-pay | Admitting: Emergency Medicine

## 2009-09-08 ENCOUNTER — Encounter (INDEPENDENT_AMBULATORY_CARE_PROVIDER_SITE_OTHER): Payer: Self-pay | Admitting: Internal Medicine

## 2009-09-08 ENCOUNTER — Ambulatory Visit: Payer: Self-pay | Admitting: Cardiology

## 2009-09-09 ENCOUNTER — Encounter (INDEPENDENT_AMBULATORY_CARE_PROVIDER_SITE_OTHER): Payer: Self-pay | Admitting: Internal Medicine

## 2009-09-10 ENCOUNTER — Ambulatory Visit: Payer: Self-pay | Admitting: Vascular Surgery

## 2009-09-10 ENCOUNTER — Encounter (INDEPENDENT_AMBULATORY_CARE_PROVIDER_SITE_OTHER): Payer: Self-pay | Admitting: Internal Medicine

## 2009-09-11 ENCOUNTER — Ambulatory Visit: Payer: Self-pay | Admitting: Family Medicine

## 2009-09-11 LAB — CONVERTED CEMR LAB
BUN: 12 mg/dL (ref 6–23)
Basophils Absolute: 0 10*3/uL (ref 0.0–0.1)
Basophils Relative: 0.3 % (ref 0.0–3.0)
CO2: 31 meq/L (ref 19–32)
Calcium: 9.8 mg/dL (ref 8.4–10.5)
Chloride: 106 meq/L (ref 96–112)
Creatinine, Ser: 0.6 mg/dL (ref 0.4–1.2)
Eosinophils Absolute: 0.3 10*3/uL (ref 0.0–0.7)
Eosinophils Relative: 4.3 % (ref 0.0–5.0)
GFR calc non Af Amer: 106.73 mL/min (ref >60)
Glucose, Bld: 81 mg/dL (ref 70–99)
HCT: 38.7 % (ref 36.0–46.0)
Hemoglobin: 12.9 g/dL (ref 12.0–15.0)
Lymphocytes Relative: 23.5 % (ref 12.0–46.0)
Lymphs Abs: 1.7 10*3/uL (ref 0.7–4.0)
MCHC: 33.4 g/dL (ref 30.0–36.0)
MCV: 94.2 fL (ref 78.0–100.0)
Monocytes Absolute: 0.6 10*3/uL (ref 0.1–1.0)
Monocytes Relative: 7.7 % (ref 3.0–12.0)
Neutro Abs: 4.6 10*3/uL (ref 1.4–7.7)
Neutrophils Relative %: 64.2 % (ref 43.0–77.0)
Platelets: 219 10*3/uL (ref 150.0–400.0)
Potassium: 4.7 meq/L (ref 3.5–5.1)
RBC: 4.1 M/uL (ref 3.87–5.11)
RDW: 12.4 % (ref 11.5–14.6)
Sodium: 141 meq/L (ref 135–145)
WBC: 7.2 10*3/uL (ref 4.5–10.5)

## 2009-09-15 ENCOUNTER — Ambulatory Visit: Payer: Self-pay | Admitting: Family Medicine

## 2009-09-23 ENCOUNTER — Ambulatory Visit: Payer: Self-pay | Admitting: Family Medicine

## 2009-09-23 LAB — CONVERTED CEMR LAB
ALT: 15 units/L (ref 0–35)
AST: 13 units/L (ref 0–37)
Albumin: 3.7 g/dL (ref 3.5–5.2)
Alkaline Phosphatase: 46 units/L (ref 39–117)
BUN: 28 mg/dL — ABNORMAL HIGH (ref 6–23)
Basophils Absolute: 0 10*3/uL (ref 0.0–0.1)
Basophils Relative: 0.3 % (ref 0.0–3.0)
Bilirubin, Direct: 0.1 mg/dL (ref 0.0–0.3)
CO2: 34 meq/L — ABNORMAL HIGH (ref 19–32)
Calcium: 10 mg/dL (ref 8.4–10.5)
Chloride: 109 meq/L (ref 96–112)
Cholesterol: 167 mg/dL (ref 0–200)
Creatinine, Ser: 0.9 mg/dL (ref 0.4–1.2)
Creatinine,U: 181.2 mg/dL
Eosinophils Absolute: 0.1 10*3/uL (ref 0.0–0.7)
Eosinophils Relative: 1 % (ref 0.0–5.0)
GFR calc non Af Amer: 66.84 mL/min (ref >60)
Glucose, Bld: 87 mg/dL (ref 70–99)
HCT: 39.4 % (ref 36.0–46.0)
HDL: 86.5 mg/dL (ref >39.00)
Hemoglobin: 13.4 g/dL (ref 12.0–15.0)
Hgb A1c MFr Bld: 5.7 % (ref 4.6–6.5)
LDL Cholesterol: 59 mg/dL (ref 0–99)
Lymphocytes Relative: 28.2 % (ref 12.0–46.0)
Lymphs Abs: 3.3 10*3/uL (ref 0.7–4.0)
MCHC: 33.9 g/dL (ref 30.0–36.0)
MCV: 96.8 fL (ref 78.0–100.0)
Microalb Creat Ratio: 5.5 mg/g (ref 0.0–30.0)
Microalb, Ur: 1 mg/dL (ref 0.0–1.9)
Monocytes Absolute: 1 10*3/uL (ref 0.1–1.0)
Monocytes Relative: 8.8 % (ref 3.0–12.0)
Neutro Abs: 7.2 10*3/uL (ref 1.4–7.7)
Neutrophils Relative %: 61.7 % (ref 43.0–77.0)
Platelets: 213 10*3/uL (ref 150.0–400.0)
Potassium: 4.5 meq/L (ref 3.5–5.1)
RBC: 4.07 M/uL (ref 3.87–5.11)
RDW: 13.8 % (ref 11.5–14.6)
Sodium: 143 meq/L (ref 135–145)
TSH: 0.89 microintl units/mL (ref 0.35–5.50)
Total Bilirubin: 0.7 mg/dL (ref 0.3–1.2)
Total CHOL/HDL Ratio: 2
Total Protein: 6.1 g/dL (ref 6.0–8.3)
Triglycerides: 107 mg/dL (ref 0.0–149.0)
VLDL: 21.4 mg/dL (ref 0.0–40.0)
WBC: 11.6 10*3/uL — ABNORMAL HIGH (ref 4.5–10.5)

## 2009-09-25 ENCOUNTER — Ambulatory Visit: Payer: Self-pay | Admitting: Family Medicine

## 2009-09-29 ENCOUNTER — Ambulatory Visit: Payer: Self-pay | Admitting: Family Medicine

## 2009-10-08 ENCOUNTER — Ambulatory Visit: Payer: Self-pay | Admitting: Family Medicine

## 2009-10-08 ENCOUNTER — Encounter (INDEPENDENT_AMBULATORY_CARE_PROVIDER_SITE_OTHER): Payer: Self-pay | Admitting: *Deleted

## 2009-10-08 LAB — CONVERTED CEMR LAB
OCCULT 1: NEGATIVE
OCCULT 2: NEGATIVE
OCCULT 3: NEGATIVE

## 2009-10-13 ENCOUNTER — Telehealth: Payer: Self-pay | Admitting: Family Medicine

## 2009-10-14 ENCOUNTER — Encounter: Payer: Self-pay | Admitting: Family Medicine

## 2009-12-29 ENCOUNTER — Ambulatory Visit: Payer: Self-pay | Admitting: Family Medicine

## 2009-12-29 DIAGNOSIS — R42 Dizziness and giddiness: Secondary | ICD-10-CM | POA: Insufficient documentation

## 2010-01-13 ENCOUNTER — Encounter: Admission: RE | Admit: 2010-01-13 | Discharge: 2010-01-13 | Payer: Self-pay | Admitting: Family Medicine

## 2010-03-03 ENCOUNTER — Ambulatory Visit: Payer: Self-pay | Admitting: Cardiology

## 2010-03-03 ENCOUNTER — Encounter (INDEPENDENT_AMBULATORY_CARE_PROVIDER_SITE_OTHER): Payer: Self-pay | Admitting: *Deleted

## 2010-03-19 ENCOUNTER — Telehealth: Payer: Self-pay | Admitting: Family Medicine

## 2010-04-10 ENCOUNTER — Telehealth (INDEPENDENT_AMBULATORY_CARE_PROVIDER_SITE_OTHER): Payer: Self-pay | Admitting: *Deleted

## 2010-04-14 ENCOUNTER — Ambulatory Visit: Payer: Self-pay | Admitting: Family Medicine

## 2010-04-15 LAB — CONVERTED CEMR LAB
BUN: 29 mg/dL — ABNORMAL HIGH (ref 6–23)
CO2: 31 meq/L (ref 19–32)
Calcium: 10.8 mg/dL — ABNORMAL HIGH (ref 8.4–10.5)
Chloride: 103 meq/L (ref 96–112)
Cholesterol: 154 mg/dL (ref 0–200)
Creatinine, Ser: 0.8 mg/dL (ref 0.4–1.2)
GFR calc non Af Amer: 76.44 mL/min (ref >60)
Glucose, Bld: 101 mg/dL — ABNORMAL HIGH (ref 70–99)
HDL: 62.7 mg/dL (ref >39.00)
Hgb A1c MFr Bld: 5.7 % (ref 4.6–6.5)
LDL Cholesterol: 71 mg/dL (ref 0–99)
Potassium: 4.4 meq/L (ref 3.5–5.1)
Sodium: 143 meq/L (ref 135–145)
Total CHOL/HDL Ratio: 2
Triglycerides: 103 mg/dL (ref 0.0–149.0)
VLDL: 20.6 mg/dL (ref 0.0–40.0)

## 2010-04-16 ENCOUNTER — Ambulatory Visit: Payer: Self-pay | Admitting: Family Medicine

## 2010-04-16 DIAGNOSIS — R5383 Other fatigue: Secondary | ICD-10-CM

## 2010-04-16 DIAGNOSIS — R5381 Other malaise: Secondary | ICD-10-CM | POA: Insufficient documentation

## 2010-04-16 DIAGNOSIS — F341 Dysthymic disorder: Secondary | ICD-10-CM | POA: Insufficient documentation

## 2010-04-16 HISTORY — DX: Dysthymic disorder: F34.1

## 2010-04-20 ENCOUNTER — Encounter: Payer: Self-pay | Admitting: Family Medicine

## 2010-04-20 LAB — CONVERTED CEMR LAB
Basophils Absolute: 0 10*3/uL (ref 0.0–0.1)
Basophils Relative: 0.5 % (ref 0.0–3.0)
Eosinophils Absolute: 0.2 10*3/uL (ref 0.0–0.7)
Eosinophils Relative: 2.8 % (ref 0.0–5.0)
Folate: >20 ng/mL
HCT: 40 % (ref 36.0–46.0)
Hemoglobin: 13.7 g/dL (ref 12.0–15.0)
Lymphocytes Relative: 23.6 % (ref 12.0–46.0)
Lymphs Abs: 2 10*3/uL (ref 0.7–4.0)
MCHC: 34.3 g/dL (ref 30.0–36.0)
MCV: 93.7 fL (ref 78.0–100.0)
Monocytes Absolute: 0.8 10*3/uL (ref 0.1–1.0)
Monocytes Relative: 9.4 % (ref 3.0–12.0)
Neutro Abs: 5.4 10*3/uL (ref 1.4–7.7)
Neutrophils Relative %: 63.7 % (ref 43.0–77.0)
Platelets: 227 10*3/uL (ref 150.0–400.0)
RBC: 4.27 M/uL (ref 3.87–5.11)
RDW: 12.8 % (ref 11.5–14.6)
TSH: 1.38 microintl units/mL (ref 0.35–5.50)
Vitamin B-12: >1500 pg/mL — ABNORMAL HIGH (ref 211–911)
WBC: 8.5 10*3/uL (ref 4.5–10.5)

## 2010-04-22 ENCOUNTER — Ambulatory Visit: Payer: Self-pay | Admitting: Family Medicine

## 2010-04-23 ENCOUNTER — Telehealth: Payer: Self-pay | Admitting: Family Medicine

## 2010-04-27 ENCOUNTER — Encounter: Payer: Self-pay | Admitting: Family Medicine

## 2010-04-27 ENCOUNTER — Ambulatory Visit: Payer: Self-pay | Admitting: Internal Medicine

## 2010-05-21 ENCOUNTER — Ambulatory Visit: Payer: Self-pay | Admitting: Family Medicine

## 2010-05-21 DIAGNOSIS — M949 Disorder of cartilage, unspecified: Secondary | ICD-10-CM

## 2010-05-21 DIAGNOSIS — M899 Disorder of bone, unspecified: Secondary | ICD-10-CM | POA: Insufficient documentation

## 2010-06-12 ENCOUNTER — Ambulatory Visit: Payer: Self-pay | Admitting: Family Medicine

## 2010-06-12 DIAGNOSIS — N63 Unspecified lump in unspecified breast: Secondary | ICD-10-CM | POA: Insufficient documentation

## 2010-06-19 ENCOUNTER — Encounter: Admission: RE | Admit: 2010-06-19 | Discharge: 2010-06-19 | Payer: Self-pay | Admitting: Family Medicine

## 2010-07-03 ENCOUNTER — Ambulatory Visit: Payer: Self-pay | Admitting: Family Medicine

## 2010-07-04 ENCOUNTER — Emergency Department: Payer: Self-pay | Admitting: Unknown Physician Specialty

## 2010-07-07 ENCOUNTER — Encounter: Payer: Self-pay | Admitting: Family Medicine

## 2010-07-07 LAB — CONVERTED CEMR LAB: Fecal Occult Bld: NEGATIVE

## 2010-07-21 ENCOUNTER — Telehealth: Payer: Self-pay | Admitting: Family Medicine

## 2010-07-22 ENCOUNTER — Telehealth: Payer: Self-pay | Admitting: Family Medicine

## 2010-08-27 NOTE — Consult Note (Signed)
Summary: [facility]/Follow Up Visit/Dr. [facility]/Follow Up Visit/Dr. Johna Sheriff   Imported By: Mickle Asper 11/24/2007 15:13:47  _____________________________________________________________________  External Attachment:    Type:   Image     Comment:   External Document

## 2010-08-27 NOTE — Letter (Signed)
Summary: Results Follow up Letter  [facility]  [address] [place], [place] [address]   Phone: [phone]  Fax: [phone]    07/07/2010 MRN: [id number]  [name] [address] [place], [place]  [address]  Dear [name],  The following are the results of your recent test(s):  Test         Result    Pap Smear:        Normal _____  Not Normal _____ Comments: ______________________________________________________ Cholesterol: LDL(Bad cholesterol):         Your goal is less than:         HDL (Good cholesterol):       Your goal is more than: Comments:  ______________________________________________________ Mammogram:        Normal _____  Not Normal _____ Comments:  ___________________________________________________________________ Hemoccult:        Normal __X___  Not normal _______ Comments:    _____________________________________________________________________ Other Tests:    We routinely do not discuss normal results over the telephone.  If you desire a copy of the results, or you have any questions about this information we can discuss them at your next office visit.   Sincerely,      Dr. [name]

## 2010-08-27 NOTE — Assessment & Plan Note (Signed)
Summary: bp check/tsc   Vital Signs:  Patient Profile:   65 Years Old Female Weight:      164 pounds Temp:     98.3 degrees F oral Pulse rate:   80 / minute Pulse rhythm:   regular BP sitting:   130 / 80  (left arm) Cuff size:   regular  Vitals Entered By: Providence Crosby (June 19, 2007 4:01 PM)                 Chief Complaint:  CHECK BP DIZZINESS .  History of Present Illness: Doing well now, dizziness now resolved. Initially sto[pped Metoprolol w/o significant change but then stopped her Simvastatin and the dizziness resolved. Called and asked her pharmacist if Simvastatin caused dizziness and was told no...and I agree not a typical side effect.  Current Allergies (reviewed today): ! PENICILLIN V POTASSIUM ! CODEINE SULFATE      Physical Exam  General:     Well-developed,well-nourished,in no acute distress; alert,appropriate and cooperative throughout examination Head:     Normocephalic and atraumatic without obvious abnormalities. No apparent alopecia or balding. Eyes:     Conjunctiva clear bilaterally.  Ears:     External ear exam shows no significant lesions or deformities.  Otoscopic examination reveals clear canals, tympanic membranes are intact bilaterally without bulging, retraction, inflammation or discharge. Hearing is grossly normal bilaterally. Nose:     External nasal examination shows no deformity or inflammation. Nasal mucosa are pink and moist without lesions or exudates. Mouth:     Oral mucosa and oropharynx without lesions or exudates.  Teeth in good repair. Neck:     No deformities, masses, or tenderness noted. Chest Wall:     No deformities, masses, or tenderness noted. Breasts:     No mass, nodules, thickening, tenderness, bulging, retraction, inflamation, nipple discharge or skin changes noted.   Lungs:     Normal respiratory effort, chest expands symmetrically. Lungs are clear to auscultation, no crackles or wheezes. Heart:     Normal  rate and regular rhythm. S1 and S2 normal without gallop, murmur, click, rub or other extra sounds. Abdomen:     Bowel sounds positive,abdomen soft and non-tender without masses, organomegaly or hernias noted.    Impression & Recommendations:  Problem # 1:  DIZZINESS (ICD-780.4) Assessment: New Better off Metoprolol and then Soimvastatin. Her updated medication list for this problem includes:    Zyrtec Allergy 10 Mg Tabs (Cetirizine hcl) Demonstrated maneuvers to self-treat vertigo. Patient to call to be seen if no improvement in 10-14 days, sooner if worse.   Problem # 2:  HYPERTENSION, BENIGN ESSENTIAL (ICD-401.1) Assessment: Improved Good control on Hyzaar alone...continue. The following medications were removed from the medication list:    Metoprolol Tartrate 100 Mg Tabs (Metoprolol tartrate) .Marland Kitchen... 1/2 tab by mouth at night  Her updated medication list for this problem includes:    Hyzaar 100-25 Mg Tabs (Losartan potassium-hctz) .Marland Kitchen... 1 by mouth qam  BP today: 130/80 Prior BP: 120/70 (05/08/2007)  Labs Reviewed: Creat: 0.6 (08/11/2006) Chol: 138 (08/11/2006)   HDL: 52.4 (08/11/2006)   LDL: 67 (08/11/2006)   TG: 91 (08/11/2006)   Problem # 3:  AODM (ICD-250.00) Assessment: Improved great nos on currenty meds and DIET. Still losing weight!! Her updated medication list for this problem includes:    Metformin Hcl 500 Mg Tabs (Metformin hcl) .Marland Kitchen... Take 1 tablet by mouth in the evening    Hyzaar 100-25 Mg Tabs (Losartan potassium-hctz) .Marland Kitchen... 1 by mouth qam  Actos 30 Mg Tabs (Pioglitazone hcl) .Marland Kitchen... 1 qd  Labs Reviewed: HgBA1c: 5.5 (05/04/2007)   Creat: 0.6 (08/11/2006)   Microalbumin: 8.9 (07/26/2006)   Complete Medication List: 1)  Buspirone Hcl 10 Mg Tabs (Buspirone hcl) .Marland Kitchen.. 1 by mouth two times a day 2)  Flomax 0.4 Mg Cp24 (Tamsulosin hcl) .Marland Kitchen.. 1 by mouth at bedtime 3)  Xanax 0.25 Mg Tabs (Alprazolam) .... Take 1 tablet by mouth every four hours 4)  Metformin Hcl  500 Mg Tabs (Metformin hcl) .... Take 1 tablet by mouth in the evening 5)  Hyzaar 100-25 Mg Tabs (Losartan potassium-hctz) .Marland Kitchen.. 1 by mouth qam 6)  Ambien 10 Mg Tabs (Zolpidem tartrate) .... As needed 7)  Alprazolam 0.5 Mg Tabs (Alprazolam) .... 1/2 tab by mouth in am, 1 at night, then 1/2 prn 8)  Actos 30 Mg Tabs (Pioglitazone hcl) .Marland Kitchen.. 1 qd 9)  Zyrtec Allergy 10 Mg Tabs (Cetirizine hcl) 10)  Wellbutrin Xl 300 Mg Tb24 (Bupropion hcl) .Marland Kitchen.. 1 q am 11)  Tums E-x 750 Mg Chew (Calcium carbonate antacid) .Marland Kitchen.. 1 qd   Patient Instructions: 1)  RTC 1 month, stay off Metoprolol. 2)  Stay off Simvastatin.  3)  Recheck BP. If BP good, will stay off Metoprolol and restart Simvastatin.    Prescriptions: WELLBUTRIN XL 300 MG  TB24 (BUPROPION HCL) 1 Q AM  #90 x 3   Entered and Authorized by:   Shaune Leeks MD   Signed by:   Shaune Leeks MD on 06/19/2007   Method used:   Electronically sent to ...       [facility] [phone]*       [address]       [place], [place]  [address]       Ph: [phone] or [phone]       Fax: [phone]   RxID:   [id number] ACTOS 30 MG  TABS (PIOGLITAZONE HCL) 1 QD  #90 x 3   Entered and Authorized by:   Shaune Leeks MD   Signed by:   Shaune Leeks MD on 06/19/2007   Method used:   Electronically sent to ...       [facility] [phone]*       [address]       [place], [place]  [address]       Ph: [phone] or [phone]       Fax: [phone]   RxID:   [id number] ALPRAZOLAM 0.5 MG  TABS (ALPRAZOLAM) 1/2 tab by mouth in AM, 1 at night, then 1/2 prn  #180 x 1   Entered and Authorized by:   Shaune Leeks MD   Signed by:   Shaune Leeks MD on 06/19/2007   Method used:   Electronically sent to ...       [facility] [id number]*       [address]       [place], [place]  [address]       Ph: [phone] or [phone]        Fax: [phone]   RxID:   [id number] HYZAAR 100-25 MG  TABS (LOSARTAN POTASSIUM-HCTZ) 1 by mouth qam  #90 x 3   Entered and Authorized by:   Shaune Leeks MD   Signed by:  Shaune Leeks MD on 06/19/2007   Method used:   Electronically sent to ...       [facility] [phone]*       [address]       [place], [place]  [address]       Ph: [phone] or [phone]       Fax: [phone]   RxID:   [id number] FLOMAX 0.4 MG CP24 (TAMSULOSIN HCL) 1 by mouth at bedtime  #90 x 3   Entered and Authorized by:   Shaune Leeks MD   Signed by:   Shaune Leeks MD on 06/19/2007   Method used:   Electronically sent to ...       [facility] [phone]*       [address]       [place], [place]  [address]       Ph: [phone] or [phone]       Fax: [phone]   RxID:   [id number] BUSPIRONE HCL 10 MG TABS (BUSPIRONE HCL) 1 by mouth two times a day  #180 x 3   Entered and Authorized by:   Shaune Leeks MD   Signed by:   Shaune Leeks MD on 06/19/2007   Method used:   Electronically sent to ...       [facility] [id number]*       [address]       [place], [place]  [address]       Ph: [phone] or [phone]       Fax: [phone]   RxID:   [id number]  ]  Appended Document: bp check/tsc      Current Allergies: ! PENICILLIN V POTASSIUM ! CODEINE SULFATE        Complete Medication List: 1)  Buspirone Hcl 10 Mg Tabs (Buspirone hcl) .Marland Kitchen.. 1 by mouth two times a day 2)  Flomax 0.4 Mg Cp24 (Tamsulosin hcl) .Marland Kitchen.. 1 by mouth at bedtime 3)  Xanax 0.25 Mg Tabs (Alprazolam) .... Take 1 tablet by mouth every four hours 4)  Metformin Hcl 500 Mg Tabs (Metformin hcl) .... Take 1 tablet by mouth in the evening 5)  Accolate 20 Mg Tabs (Zafirlukast) 6)  Ambien 10 Mg Tabs (Zolpidem tartrate) .... As needed 7)  Accolate 20 Mg Tabs (Zafirlukast) 8)   Actos 30 Mg Tabs (Pioglitazone hcl) .Marland Kitchen.. 1 qd 9)  Zyrtec Allergy 10 Mg Tabs (Cetirizine hcl) 10)  Wellbutrin Xl 300 Mg Tb24 (Bupropion hcl) .Marland Kitchen.. 1 q am 11)  Tums E-x 750 Mg Chew (Calcium carbonate antacid) .Marland Kitchen.. 1 qd     Prescriptions: WELLBUTRIN XL 300 MG  TB24 (BUPROPION HCL) 1 Q AM  #90 x 3   Entered and Authorized by:   Shaune Leeks MD   Signed by:   Shaune Leeks MD on 06/19/2007   Method used:   Print then Give to Patient   RxID:   [id number] ACTOS 30 MG  TABS (PIOGLITAZONE HCL) 1 QD  #90 x 3   Entered and Authorized by:   Shaune Leeks MD   Signed by:   Shaune Leeks MD on 06/19/2007   Method used:   Print then Give to Patient   RxID:   [id number] ACCOLATE 20 MG TABS (ZAFIRLUKAST)   #180  x 1   Entered and Authorized by:   Shaune Leeks MD   Signed by:   Shaune Leeks MD on 06/19/2007   Method used:   Print then Give to Patient   RxID:   [id number] ACCOLATE 20 MG TABS (ZAFIRLUKAST)   #90 x 3   Entered and Authorized by:   Shaune Leeks MD   Signed by:   Shaune Leeks MD on 06/19/2007   Method used:   Print then Give to Patient   RxID:   [id number] FLOMAX 0.4 MG CP24 (TAMSULOSIN HCL) 1 by mouth at bedtime  #90 x 3   Entered and Authorized by:   Shaune Leeks MD   Signed by:   Shaune Leeks MD on 06/19/2007   Method used:   Print then Give to Patient   RxID:   [id number] BUSPIRONE HCL 10 MG TABS (BUSPIRONE HCL) 1 by mouth two times a day  #180 x 3   Entered and Authorized by:   Shaune Leeks MD   Signed by:   Shaune Leeks MD on 06/19/2007   Method used:   Print then Give to Patient   RxID:   [id number]  ]

## 2010-08-27 NOTE — Assessment & Plan Note (Signed)
Summary: ARM PAIN/ 10:30   Vital Signs:  Patient profile:   65 year old female Height:      62 inches Weight:      153.0 pounds BMI:     28.09 Temp:     97.9 degrees F oral Pulse rate:   80 / minute Pulse rhythm:   regular BP sitting:   120 / 84  (left arm) Cuff size:   regular  Vitals Entered By: Benny Lennert CMA Duncan Dull) (September 05, 2009 10:55 AM)  History of Present Illness: Chief complaint right arm pain  Sudden onset 2 days ago while typing..tingling right arm. Yesterday mild pain.  Awoke this AM with severe pain in arm... pain in right neck runs down arm and tingling in fingers.  Using ibuprofen 800 mg ..no improvement. Right hand colder than left.  No recent new med s or change in activity.  No falls.   No increase in pain with neck movement.   Has history of DVT.Marland Kitchenoccured after not being mobile..was coumadin x 1 year. 7 years ago.  Stable Bell's palsy on left.   Problems Prior to Update: 1)  Hx of Metabolic Syndrome X  (ICD-277.7) 2)  Deep Venous Thrombophlebitis, Hx of Bilat  (ICD-V12.52) 3)  Hypertension, Benign Essential  (ICD-401.1) 4)  Obesity, Morbid  (ICD-278.01) 5)  Hyperlipidemia, Mixed  (ICD-272.2) 6)  Abnormal Electrocardiogram, Prolonged Qt Segment  (ICD-794.31) 7)  Special Screening Malig Neoplasms Other Sites  (ICD-V76.49) 8)  Health Maintenance Exam  (ICD-V70.0) 9)  Other Screening Mammogram  (ICD-V76.12) 10)  Disorder, Depressive Nec  (ICD-311) 11)  Aodm  (ICD-250.00) 12)  Bell's Palsy, Right, Recurrent  (ICD-351.0) 13)  Postmenopausal On Hormone Replacement Therapy  (ICD-V07.4) 14)  Allergy  (ICD-995.3) 15)  Grief Reaction  (ICD-309.0) 16)  Need Prophylactic Vaccination&inoculation Flu  (ICD-V04.81)  Current Medications (verified): 1)  Wellbutrin Xl 300 Mg  Tb24 (Bupropion Hcl) .Marland Kitchen.. 1 Q Am 2)  Accu-Chek Compact Test Drum   Strp (Glucose Blood) .... Test Blood Twice Daily 3)  Alprazolam 0.5 Mg  Tabs (Alprazolam) .Marland Kitchen.. 1 Two Times A  Day 4)  Losartan Potassium-Hctz 100-25 Mg Tabs (Losartan Potassium-Hctz) .Marland Kitchen.. 1 Tab Once Daily 5)  Simvastatin 80 Mg  Tabs (Simvastatin) .... One Tab By Mouth At Night 6)  Accu-Chek Softclix Lancets  Misc (Lancets) .... Use Daily As Directed  250.00 Icd-9 Code 7)  Centrum Silver  Tabs (Multiple Vitamins-Minerals) .Marland Kitchen.. 1 Daily By Mouth 8)  Aspirin 81 Mg Tbec (Aspirin) .... Take One Tablet By Mouth Daily 9)  Vitamin B-12 1000 Mcg Tabs (Cyanocobalamin) .Marland Kitchen.. 1 Tab Once Daily 10)  Tramadol Hcl 50 Mg Tabs (Tramadol Hcl) .Marland Kitchen.. 1 Tab By Mouth Q6 Hours As Needed Pain  Allergies: 1)  ! Penicillin V Potassium 2)  ! Codeine Sulfate  Past History:  Past medical, surgical, family and social histories (including risk factors) reviewed, and no changes noted (except as noted below).  Past Medical History: Reviewed history from 02/15/2009 and no changes required. METABOLIC SYNDROME X (ICD-277.7) DEEP VENOUS THROMBOPHLEBITIS, HX OF BILAT (ICD-V12.52) HYPERTENSION, BENIGN ESSENTIAL (ICD-401.1) OBESITY, MORBID (ICD-278.01) HYPERLIPIDEMIA, MIXED (ICD-272.2) ABNORMAL ELECTROCARDIOGRAM, PROLONGED QT SEGMENT (ICD-794.31) SPECIAL SCREENING MALIG NEOPLASMS OTHER SITES (ICD-V76.49) HEALTH MAINTENANCE EXAM (ICD-V70.0) OTHER SCREENING MAMMOGRAM (ICD-V76.12) DISORDER, DEPRESSIVE NEC (ICD-311) AODM (ICD-250.00) BELL'S PALSY, RIGHT, RECURRENT (ICD-351.0) POSTMENOPAUSAL ON HORMONE REPLACEMENT THERAPY (ICD-V07.4) ALLERGY (ICD-995.3) GRIEF REACTION (ICD-309.0) NEED PROPHYLACTIC VACCINATION&INOCULATION FLU (ICD-V04.81)    Past Surgical History: Reviewed history from 08/29/2008 and no changes required. NSVD X  2 T AND A 1970'S TAH BSO  DYSMENNORHEA  1988 H/O BELL'S PALSY  15 YOA 20YOA 03/1995 BLADDER TACK 11/1995 ABD U/S NML 03/1996 ECHO NML 9/97  ETT POS 08/1997 CATH NML 10/1997 FLEX SIG HEMMS O/W NML 12/1997 SEPTOPLASTY W/ ANTRAL WINDOW  ([facility])  08/1998 BREAST CYST ASPIRATION  (DR YOUNG)  12/1998 BREAST BX  (YOUNG) NML 05/20/2000 SLEEP STUDY DECR SLEEP TIME, DECR REM SLEEP, DESAT 86%  07/2000 ETT CARDIOLITE NML EF 75% 12/26/2000 ECHO MILD LVH,LAH,MR, TR EF 50-55%   12/26/2000 ETT CARDIOLITE NML EF 70%  09/2001 HOSP R/O'D MI  10/06/2001 PARATHYROID SCAN  NML  03/15/2002 ARTHROSCOPY R KNEE  12/12/2002 CT HEAD NML  02/10/2003 HOSP SYNCOPE W/ BILAT ELBOW FX'S, TIB PLATEAU FX R KNEE  02/10/2003 CATH NML  CAROTID U/S MILD BILAT DZ  02/12/2003 HOSP  BILAT BASILAR PE'S   8/6-03/04/2003 CATTARACT REMOVALS  R 09/24/2002   L 05/28/2003 DIABETIC MAINT REFERRAL 09/2004 TOE EXCISION, L FIFTH TOE 12/2004 ETT MYOVIEW  01/10/2006  05/2005  Family History: Reviewed history from 08/29/2008 and no changes required. Father dec 63 MI COPD ( Smoker) TB Mother dec 70 Natural Causes Sister A 20 DM Probs w/ leg due Brown Recluse bite, Vision loss...legally blind  Social History: Reviewed history from 08/29/2008 and no changes required. Occupation: [employer] Married/ Widowed  03/11/2007  2 children  Son DM and obese. Never Smoked Alcohol use-no Drug use-no  Review of Systems General:  Denies fatigue. CV:  Denies chest pain or discomfort. Resp:  Denies shortness of breath. GI:  Denies abdominal pain.  Physical Exam  General:  overweight female in NAD Mouth:  MMM Neck:  no carotid bruit or thyromegaly no cervical or supraclavicular lymphadenopathy Right ttp over lateral neck, neg Spurling, decrease ROM due to pain Lungs:  Normal respiratory effort, chest expands symmetrically. Lungs are clear to auscultation, no crackles or wheezes. Heart:  Normal rate and regular rhythm. S1 and S2 normal without gallop, murmur, click, rub or other extra sounds. Msk:  ttp over bicep and elbow. no sub acromial pain, neg impingement pain with any movement of arm but not localize to joint.  Pulses:  R and L posterior tibial pulses are full and equal bilaterally  Extremities:  right hand mildly swollen and significantly colder than  left Skin:  no redness   Impression & Recommendations:  Problem # 1:  ARM PAIN, RIGHT (ICD-729.5) Not typical joint pain. Pulses normal B, but given histroy of DVT, swelling, sudden onset..will eval for DVT. No clear radiculopathy from cervical spine..pain is non vertebral and more lateral.  Has appt with primary on MOnday.  Tramadol given for pain.  Radiology Referral (Radiology)  Complete Medication List: 1)  Wellbutrin Xl 300 Mg Tb24 (Bupropion hcl) .Marland Kitchen.. 1 q am 2)  Accu-chek Compact Test Drum Strp (Glucose blood) .... Test blood twice daily 3)  Alprazolam 0.5 Mg Tabs (Alprazolam) .Marland Kitchen.. 1 two times a day 4)  Losartan Potassium-hctz 100-25 Mg Tabs (Losartan potassium-hctz) .Marland Kitchen.. 1 tab once daily 5)  Simvastatin 80 Mg Tabs (Simvastatin) .... One tab by mouth at night 6)  Accu-chek Softclix Lancets Misc (Lancets) .... Use daily as directed  250.00 icd-9 code 7)  Centrum Silver Tabs (Multiple vitamins-minerals) .Marland Kitchen.. 1 daily by mouth 8)  Aspirin 81 Mg Tbec (Aspirin) .... Take one tablet by mouth daily 9)  Vitamin B-12 1000 Mcg Tabs (Cyanocobalamin) .Marland Kitchen.. 1 tab once daily 10)  Tramadol Hcl 50 Mg Tabs (Tramadol hcl) .Marland Kitchen.. 1 tab by  mouth q6 hours as needed pain  Patient Instructions: 1)  Referral Appointment Information 2)  Day/Date: 3)  Time: 4)  Place/MD: 5)  Address: 6)  Phone/Fax: 7)  Patient given appointment information. Information/Orders faxed/mailed.  Prescriptions: TRAMADOL HCL 50 MG TABS (TRAMADOL HCL) 1 tab by mouth q6 hours as needed pain  #30 x 0   Entered and Authorized by:   Kerby Nora MD   Signed by:   Kerby Nora MD on 09/05/2009   Method used:   Print then Give to Patient   RxID:   [id number]   Current Allergies (reviewed today): ! PENICILLIN V POTASSIUM ! CODEINE SULFATE

## 2010-08-27 NOTE — Progress Notes (Signed)
Summary: Please add these tests  Phone Note Call from Patient Call back at Work Phone [phone]   Caller: Patient Summary of Call: Pt has lab appt next wed for a f/u appt w/you on Monday 7/27. She requests that on that lab appt you would also include a test for her Iron level and her Vit. B level. She states she is feeling tired and she wants to see if that may be the problem. When I checked her lab appt. it only showed a A1C test to be done. Initial call taken by: Mickle Asper,  February 07, 2008 4:21 PM  Follow-up for Phone Call        done as requested. Follow-up by: Shaune Leeks MD,  February 08, 2008 7:35 AM  Additional Follow-up for Phone Call Additional follow up Details #1::        I rec"d orders from [facility] for Ferr, vit B12,cbcd,Fe,780.4,A1C 250.00 Additional Follow-up by: Mills Koller,  February 08, 2008 8:09 AM

## 2010-08-27 NOTE — Miscellaneous (Signed)
Summary: [facility]/Patient Care Summary  [facility]/Patient Care Summary   Imported By: Eleonore Chiquito 06/19/2008 11:41:49  _____________________________________________________________________  External Attachment:    Type:   Image     Comment:   External Document

## 2010-08-27 NOTE — Consult Note (Signed)
Summary: [facility]/Dr. [facility]/Dr. Hoxworth   Imported By: Eleonore Chiquito 08/31/2007 09:47:22  _____________________________________________________________________  External Attachment:    Type:   Image     Comment:   External Document

## 2010-08-27 NOTE — Assessment & Plan Note (Signed)
Summary: 1 M F/U, DISCUSS BONE DENSITY DLO   Vital Signs:  Patient profile:   65 year old female Height:      62 inches Weight:      156 pounds BMI:     28.64 Temp:     98.0 degrees F oral Pulse rate:   68 / minute Pulse rhythm:   regular BP sitting:   130 / 72  (right arm) Cuff size:   regular  Vitals Entered By: Linde Gillis CMA Duncan Dull) (May 21, 2010 9:01 AM) CC: one month follow up, discuss bone density   History of Present Illness: 65 yo here for one month follow up depression and to discuss bone density results.     Depression- stopped taking the Wellbutrin and has not noticed any difference at all last month, started Fluoxetine 20 mg daily. Both she and her daughter feels it has made a huge difference!!!  Not tearful anymore, not as sensitive to what other people say.  Wants to get out and do things again.   No SI or HI.  Osteopenia- DEXA results show normal bone density in spine but osteopenia in hips bilaterally.  Taking two caltrate daily.  No h/o fractures.    Current Medications (verified): 1)  Alprazolam 0.5 Mg  Tabs (Alprazolam) .Marland Kitchen.. 1 Tab By Mouth Two Times A Day. 2)  Simvastatin 80 Mg  Tabs (Simvastatin) .... One Tab By Mouth At Night 3)  Centrum Silver  Tabs (Multiple Vitamins-Minerals) .Marland Kitchen.. 1 Daily By Mouth 4)  Aspirin 81 Mg Tbec (Aspirin) .... Take One Tablet By Mouth Daily 5)  Vitamin B-12 1000 Mcg Tabs (Cyanocobalamin) .... Take One Tablet By Mouth Daily 6)  Losartan Potassium-Hctz 50-12.5 Mg Tabs (Losartan Potassium-Hctz) .Marland Kitchen.. 1 Tab By Mouth Daily. 7)  Meclizine Hcl 25 Mg Tabs (Meclizine Hcl) .... 1/2 To 1 Tab By Mouth Q 6 Hours As Needed Dizziness / Vertigo 8)  Zyrtec Allergy 10 Mg Tabs (Cetirizine Hcl) .Marland Kitchen.. 1 Tab At Bedtime 9)  Fluoxetine Hcl 20 Mg  Tabs (Fluoxetine Hcl) .... Take 1 Tab By Mouth Daily 10)  Caltrate 600+d 600-400 Mg-Unit Chew (Calcium Carbonate-Vitamin D) .... Chew Two Tablet By Mouth Daily 11)  Vitamin D3 1000 Unit Tabs  (Cholecalciferol) .Marland Kitchen.. 1 Tab By Mouth Daily.  Allergies: 1)  ! Penicillin V Potassium 2)  ! 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) POSTMENOPAUSAL ON HORMONE REPLACEMENT THERAPY (ICD-V07.4) ALLERGY (ICD-995.3) GRIEF REACTION (ICD-309.0) NEED PROPHYLACTIC VACCINATION&INOCULATION FLU (ICD-V04.81)    Past Surgical History: Last updated: 09/11/2009 NSVD X 2 T AND A 1970'S TAH BSO  DYSMENNORHEA  1988 H/O BELL'S PALSY  15 YOA 19YOA 03/1995 BLADDER TACK 11/1995 ABD U/S NML 03/1996 ECHO NML 9/97  ETT POS 08/1997 CATH NML 10/1997 FLEX SIG HEMMS O/W NML 12/1997 SEPTOPLASTY W/ ANTRAL WINDOW  ([facility])  08/1998 BREAST CYST ASPIRATION  (DR YOUNG)  12/1998 BREAST BX (YOUNG) NML 05/20/2000 SLEEP STUDY DECR SLEEP TIME, DECR REM SLEEP, DESAT 86%  07/2000 ETT CARDIOLITE NML EF 75% 12/26/2000 ECHO MILD LVH,LAH,MR, TR EF 50-55%   12/26/2000 ETT CARDIOLITE NML EF 70%  09/2001 HOSP R/O'D MI  10/06/2001 PARATHYROID SCAN  NML  03/15/2002 ARTHROSCOPY R KNEE  12/12/2002 CT HEAD NML  02/10/2003 HOSP SYNCOPE W/ BILAT ELBOW FX'S, TIB PLATEAU FX R KNEE  02/10/2003 CATH NML  CAROTID U/S MILD BILAT DZ  02/12/2003 HOSP  BILAT BASILAR PE'S   8/6-03/04/2003 CATTARACT REMOVALS  R 09/24/2002   L 05/28/2003 DIABETIC MAINT REFERRAL 09/2004 TOE EXCISION, L FIFTH TOE 12/2004 ETT MYOVIEW  01/10/2006  05/2005 HOSP  SYNCOPE, UNK ETIOL  ORTHOSTASIS  HYPOKALEMIA   2/14-2/16/2011 CT HEAD W/O  ATROPHY AND CHRONIC SM  VESS DZ 09/08/2009 CT C-SPINE NO ACUTE  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 09/08/2009 CT ANGIOGRAM CHEST  NO PE 09/08/2009 MRI C-SPINE  NEW FOCAL SOFT DISK HERNIATION C6/7 AFFECTING C7 ROOT SM EFFECT C5 ROOT 09/08/2009 MRI BRAIN NO INFARCT  PROM SM VESS DZ  ATROPHY 09/08/2009 ECHO MILD LVH EF 65%  MILDLY CALCIF MV  NML AV  09/09/2009 LE VENOUS DOPPLERS NML 09/08/2009 CAROTID U/S  NML 09/10/2009  Family History: Last updated: 09/29/2009 Father dec 63 MI COPD ( Smoker) TB Mother dec 70 Natural Causes Sister A 52 DM Probs w/ leg due [place] bite, Vision loss...legally blind  Social History: Last updated: 08/29/2008 Occupation: [employer] Married/ Widowed  03/11/2007  2 children  Son DM and obese. Never Smoked Alcohol use-no Drug use-no  Risk Factors: Alcohol Use: 0 (09/29/2009) Caffeine Use: 0 (09/29/2009) Exercise: yes (09/29/2009)  Risk Factors: Smoking Status: never (09/29/2009) Passive Smoke Exposure: no (09/29/2009)  Review of Systems      See HPI General:  Denies malaise. Psych:  Denies mental problems, panic attacks, sense of great danger, suicidal thoughts/plans, thoughts of violence, unusual visions or sounds, and thoughts /plans of harming others.  Physical Exam  General:  Well-developed,well-nourished,in no acute distress; alert,appropriate and cooperative throughout examination, minimally overweight. Psych:  Cognition and judgment appear intact. Alert and cooperative with normal attention span and concentration. No apparent delusions, illusions, hallucinations   Impression & Recommendations:  Problem # 1:  OSTEOPENIA (ICD-733.90) Assessment New Time spent with patient 25 minutes, more than 50% of this time was spent counseling patient on osteopenia and depression. Discussed taking two caltrate daily and adding Vitamin D 1000 International Units daily.  Recheck bone density in 2 years.    Her updated medication list for this problem includes:     Caltrate 600+d 600-400 Mg-unit Chew (Calcium carbonate-vitamin d) .Marland Kitchen... Chew two tablet by mouth daily    Vitamin D3 1000 Unit Tabs (Cholecalciferol) .Marland Kitchen... 1 tab by mouth daily.  Problem # 2:  ANXIETY DEPRESSION (ICD-300.4) Assessment: Improved Continue current dose of Prozac.  Complete Medication List: 1)  Alprazolam 0.5 Mg Tabs (Alprazolam) .Marland Kitchen.. 1 tab by mouth two times a day. 2)  Simvastatin 80 Mg Tabs (Simvastatin) .... One tab by mouth at night 3)  Centrum Silver Tabs (Multiple vitamins-minerals) .Marland Kitchen.. 1 daily by mouth 4)  Aspirin 81 Mg Tbec (Aspirin) .... Take one tablet by mouth daily 5)  Vitamin B-12 1000 Mcg Tabs (Cyanocobalamin) .... Take one tablet by mouth daily 6)  Losartan Potassium-hctz 50-12.5 Mg Tabs (Losartan potassium-hctz) .Marland Kitchen.. 1 tab by mouth daily. 7)  Meclizine Hcl 25 Mg Tabs (Meclizine hcl) .... 1/2 to 1 tab by mouth q 6 hours as needed dizziness / vertigo 8)  Zyrtec Allergy 10 Mg Tabs (Cetirizine hcl) .Marland Kitchen.. 1 tab at bedtime 9)  Fluoxetine Hcl 20 Mg Tabs (Fluoxetine hcl) .... Take 1 tab by mouth daily 10)  Caltrate 600+d 600-400 Mg-unit Chew (Calcium carbonate-vitamin d) .... Chew two tablet  by mouth daily 11)  Vitamin D3 1000 Unit Tabs (Cholecalciferol) .Marland Kitchen.. 1 tab by mouth daily.  Patient Instructions: 1)  Please continue taking the Caltrate along with Vitamin D 1000 International Units daily. Prescriptions: VITAMIN D3 1000 UNIT TABS (CHOLECALCIFEROL) 1 tab by mouth daily.  #30 x 6   Entered and Authorized by:   [name] MD   Signed by:   [name] MD on 05/21/2010   Method used:   Print then Give to Patient   RxID:   [id number]    Orders Added: 1)  Est. Patient Level IV [24401]    Current Allergies (reviewed today): ! PENICILLIN V POTASSIUM ! CODEINE SULFATE

## 2010-08-27 NOTE — Assessment & Plan Note (Signed)
Summary: OV  Medications Added METFORMIN HCL 500 MG TABS (METFORMIN HCL) take 1 tablet by mouth in the evening        Vital Signs:  Patient Profile:   65 Years Old Female Weight:      197 pounds Temp:     98.7 degrees F oral Pulse rate:   88 / minute Pulse rhythm:   regular BP sitting:   100 / 60  (left arm) Cuff size:   large  Vitals Entered By: Providence Crosby (February 16, 2007 11:58 AM)               Chief Complaint:  F/U.  History of Present Illness: Had Lapband surgery 14 Jul ( [facility]) ,stayed overnight w/ no problems in hosp. Can start eating solid food on Mon. Has already lost weight, admitted at 200.2 pounds, now down to 197.0!! Pt feels ok except slightly sore at staple excision site which were removed yeasterday. Numbers are typically 60-70 during day, 90 at first thing.  Current Allergies (reviewed today): ! PENICILLIN V POTASSIUM ! CODEINE SULFATE      Physical Exam  General:     Well-developed,well-nourished,in no acute distress; alert,appropriate and cooperative throughout examination Head:     Normocephalic and atraumatic without obvious abnormalities. No apparent alopecia or balding. Eyes:     Conjunctiva clear bilaterally.  Ears:     External ear exam shows no significant lesions or deformities.  Otoscopic examination reveals clear canals, tympanic membranes are intact bilaterally without bulging, retraction, inflammation or discharge. Hearing is grossly normal bilaterally. Nose:     External nasal examination shows no deformity or inflammation. Nasal mucosa are pink and moist without lesions or exudates. Mouth:     Oral mucosa and oropharynx without lesions or exudates.  Teeth in good repair. Neck:     No deformities, masses, or tenderness noted. Chest Wall:     No deformities, masses, or tenderness noted. Lungs:     Normal respiratory effort, chest expands symmetrically. Lungs are clear to auscultation, no crackles or wheezes. Heart:  Normal rate and regular rhythm. S1 and S2 normal without gallop, murmur, click, rub or other extra sounds. Abdomen:     Various entry port incisions in general healing well all over the abdomen, the RUQ incision reasonably buised and slighlt macerated butalso looks tobe healing well, dry and nonerythematous.    Impression & Recommendations:  Problem # 1:  AODM (ICD-250.00) Assessment: Improved Doing well...cont meds and cal if goes low routinely. Her updated medication list for this problem includes:    Metformin Hcl 500 Mg Tabs (Metformin hcl) .Marland Kitchen... Take 1 tablet by mouth in the evening    Hyzaar 100-25 Mg Tabs (Losartan potassium-hctz) .Marland Kitchen... 1 by mouth qam    Actos 30 Mg Tabs (Pioglitazone hcl) .Marland Kitchen... 1 qd  Labs Reviewed: HgBA1c: 5.8 (11/11/2006)   Creat: 0.6 (08/11/2006)   Microalbumin: 8.9 (07/26/2006)   Problem # 2:  HYPERTENSION, BENIGN ESSENTIAL (ICD-401.1) Assessment: Improved  Her updated medication list for this problem includes:    Caduet 5-40 Mg Tabs (Amlodipine-atorvastatin) .Marland Kitchen... 1 by mouth daily    Hyzaar 100-25 Mg Tabs (Losartan potassium-hctz) .Marland Kitchen... 1 by mouth qam  BP today: 100/60 Prior BP: 130/70 (01/31/2007)  Labs Reviewed: Creat: 0.6 (08/11/2006) Chol: 138 (08/11/2006)   HDL: 52.4 (08/11/2006)   LDL: 67 (08/11/2006)   TG: 91 (08/11/2006)   Problem # 3:  OBESITY, MORBID (ICD-278.01) Assessment: Improved  Medications Added to Medication List This Visit: 1)  Metformin  Hcl 500 Mg Tabs (Metformin hcl) .... Take 1 tablet by mouth in the evening   Patient Instructions: 1)  RTC 1 week, Fri next.

## 2010-08-27 NOTE — Progress Notes (Signed)
Summary: Ear problem  Phone Note Call from Patient Call back at [phone]   Caller: Daughter/[name] Call For: Dr. Hetty Ely Summary of Call: Pt has seen you several times about her left ear hurting her and she will not call you back.  Pt has started back having ear pain especially when the wind hits it.  Does she need to come back to see you or should she be referred to a specialist? Initial call taken by: [name],  Dec 04, 2007 4:11 PM  Follow-up for Phone Call        [facility] try ENT. Please refer. Follow-up by: Shaune Leeks MD,  Dec 04, 2007 5:01 PM  Additional Follow-up for Phone Call Additional follow up Details #1::        DR VAUGHT ON 12/05/07 AT 2:30. [name]  Dec 05, 2007 9:01 AM  Additional Follow-up by: [name],  Dec 05, 2007 9:01 AM  New Problems: EAR PAIN, LEFT, RECURRENT (ICD-388.70)   New Problems: EAR PAIN, LEFT, RECURRENT (ICD-388.70)

## 2010-08-27 NOTE — Miscellaneous (Signed)
Summary: BONE DENSITY  Clinical Lists Changes  Orders: Added new Test order of T-Bone Densitometry (77080) - Signed Added new Test order of T-Lumbar Vertebral Assessment (77082) - Signed 

## 2010-08-27 NOTE — Miscellaneous (Signed)
Summary: Orders Update  Clinical Lists Changes  Orders: Added new Test order of Venous Duplex Upper Extremity (Venous Duplex Upp) - Signed 

## 2010-08-27 NOTE — Miscellaneous (Signed)
  Medications Added GLYBURIDE 2.5 MG TABS (GLYBURIDE) Take 1 tablet by mouth every morning BUSPIRONE HCL 10 MG TABS (BUSPIRONE HCL) 1 by mouth two times a day CADUET 5-40 MG  TABS (AMLODIPINE-ATORVASTATIN) 1 by mouth daily FLOMAX 0.4 MG CP24 (TAMSULOSIN HCL) 1 by mouth at bedtime FEXOFENADINE HCL 180 MG TABS (FEXOFENADINE HCL) 1 by mouth once daily XANAX 0.25 MG TABS (ALPRAZOLAM) Take 1 tablet by mouth every four hours WELLBUTRIN SR 150 MG TB12 (BUPROPION HCL) Take 1 tablet by mouth twice a day METFORMIN HCL 500 MG TABS (METFORMIN HCL) Take 1 tablet by mouth in the morning and 2 tablets by mouth in the evening AVANDIA 8 MG  TABS (ROSIGLITAZONE MALEATE)  NASONEX 50 MCG/ACT  SUSP (MOMETASONE FUROATE) as needed HYZAAR 100-25 MG  TABS (LOSARTAN POTASSIUM-HCTZ) 1 by mouth qam FLEXERIL 10 MG  TABS (CYCLOBENZAPRINE HCL) 1/2 by mouth two times a day ZYRTEC 10 MG  TABS (CETIRIZINE HCL) 1 by mouth at bedtime PROPOXYPHENE N-APAP   TABS (PROPOXYPHENE N-APAP TABS) as needed OXYCODONE-ACETAMINOPHEN 5-500 MG  CAPS (OXYCODONE-ACETAMINOPHEN) as needed AMBIEN 10 MG  TABS (ZOLPIDEM TARTRATE) as needed ALPRAZOLAM 0.25 MG  TABS (ALPRAZOLAM) as needed       21  22  23  24  25  Allergies Added: ! PENICILLIN V POTASSIUM (PENICILLIN V POTASSIUM TABS) ! CODEINE SULFATE (CODEINE SULFATE TABS) Clinical Lists Changes  Medications: Added new medication of GLYBURIDE 2.5 MG TABS (GLYBURIDE) Take 1 tablet by mouth every morning Added new medication of BUSPIRONE HCL 10 MG TABS (BUSPIRONE HCL) 1 by mouth two times a day Added new medication of CADUET 5-40 MG  TABS (AMLODIPINE-ATORVASTATIN) 1 by mouth daily Added new medication of FLOMAX 0.4 MG CP24 (TAMSULOSIN HCL) 1 by mouth at bedtime Added new medication of FEXOFENADINE HCL 180 MG TABS (FEXOFENADINE HCL) 1 by mouth once daily Added new medication of XANAX 0.25 MG TABS (ALPRAZOLAM) Take 1 tablet by mouth every four hours Added new medication of WELLBUTRIN SR 150  MG TB12 (BUPROPION HCL) Take 1 tablet by mouth twice a day Added new medication of METFORMIN HCL 500 MG TABS (METFORMIN HCL) Take 1 tablet by mouth in the morning and 2 tablets by mouth in the evening Added new medication of AVANDIA 8 MG  TABS (ROSIGLITAZONE MALEATE) Added new medication of NASONEX 50 MCG/ACT  SUSP (MOMETASONE FUROATE) as needed Added new medication of HYZAAR 100-25 MG  TABS (LOSARTAN POTASSIUM-HCTZ) 1 by mouth qam Added new medication of FLEXERIL 10 MG  TABS (CYCLOBENZAPRINE HCL) 1/2 by mouth two times a day Added new medication of ZYRTEC 10 MG  TABS (CETIRIZINE HCL) 1 by mouth at bedtime Added new medication of PROPOXYPHENE N-APAP   TABS (PROPOXYPHENE N-APAP TABS) as needed Added new medication of OXYCODONE-ACETAMINOPHEN 5-500 MG  CAPS (OXYCODONE-ACETAMINOPHEN) as needed Added new medication of AMBIEN 10 MG  TABS (ZOLPIDEM TARTRATE) as needed Added new medication of ALPRAZOLAM 0.25 MG  TABS (ALPRAZOLAM) as needed Allergies: Added new allergy or adverse reaction of PENICILLIN V POTASSIUM (PENICILLIN V POTASSIUM TABS) Added new allergy or adverse reaction of CODEINE SULFATE (CODEINE SULFATE TABS) Observations: Added new observation of NKA: F (01/24/2007 11:51) Added new observation of LLIMPORTALLS: completed (01/24/2007 11:51)

## 2010-08-27 NOTE — Letter (Signed)
Summary: Generic Letter   at [facility]  [address] [place], [place] [address]   Phone: [phone]  Fax: [phone]    04/20/2010  PURVA VESSELL [address] [place], [place]  [address]  Dear Ms. Cammarano,    We have been unable to reach you by telephone.  Please call our office at your earliest convenience.  When calling ask to speak to [facility], Engineer, site for Dr. Dayton Martes.       Sincerely,       Linde Gillis CMA (AAMA)for Dr. Dayton Martes

## 2010-08-27 NOTE — Assessment & Plan Note (Signed)
Summary: NEED SOMETHING FOR NERVES / LFW   Vital Signs:  Patient Profile:   65 Years Old Female Weight:      188 pounds Temp:     98.8 degrees F oral Pulse rate:   76 / minute Pulse rhythm:   regular BP sitting:   130 / 90  (left arm) Cuff size:   regular  Vitals Entered ByProvidence Crosby (March 24, 2007 4:02 PM)                 Chief Complaint:  NERVES.  History of Present Illness: Having hard time dealing with loss of husband. Xanax is helping, but she is waking up early and having difficulty getting back to sleep. Tearful and lonely. Has lost more weight s/p band procedure, has f/u next week. Doing great there. Is retaining fluid which doesn't totally resolve by morning. Sugars have all been 100 or less.  Current Allergies (reviewed today): ! PENICILLIN V POTASSIUM ! CODEINE SULFATE      Physical Exam  General:     Well-developed,well-nourished,in no acute distress; alert,appropriate and cooperative throughout examination Head:     Normocephalic and atraumatic without obvious abnormalities. No apparent alopecia or balding. Eyes:     mildly congested due to crying. Ears:     External ear exam shows no significant lesions or deformities.  Otoscopic examination reveals clear canals, tympanic membranes are intact bilaterally without bulging, retraction, inflammation or discharge. Hearing is grossly normal bilaterally. Nose:     External nasal examination shows no deformity or inflammation. Nasal mucosa are pink and moist without lesions or exudates. Mouth:     Oral mucosa and oropharynx without lesions or exudates.  Teeth in good repair. Neck:     No deformities, masses, or tenderness noted. Chest Wall:     No deformities, masses, or tenderness noted. Lungs:     Normal respiratory effort, chest expands symmetrically. Lungs are clear to auscultation, no crackles or wheezes. Heart:     Normal rate and regular rhythm. S1 and S2 normal without gallop, murmur, click,  rub or other extra sounds. Extremities:     less than 1+ edema of feet bilat.    Impression & Recommendations:  Problem # 1:  OBESITY, MORBID (ICD-278.01) Assessment: Improved Great results...continue to watch.  Problem # 2:  HYPERTENSION, BENIGN ESSENTIAL (ICD-401.1) Assessment: Deteriorated Will change Norvasc due to edema. Start Metoprolol The following medications were removed from the medication list:    Caduet 5-10 Mg Tabs (Amlodipine-atorvastatin) .Marland Kitchen... Take 1 tablet by mouth every morning  Her updated medication list for this problem includes:    Metoprolol Tartrate 100 Mg Tabs (Metoprolol tartrate) .Marland Kitchen... 1/2 tab by mouth at night    Hyzaar 100-25 Mg Tabs (Losartan potassium-hctz) .Marland Kitchen... 1 by mouth qam  BP today: 130/90 Prior BP: 120/60 (02/24/2007)  Labs Reviewed: Creat: 0.6 (08/11/2006) Chol: 138 (08/11/2006)   HDL: 52.4 (08/11/2006)   LDL: 67 (08/11/2006)   TG: 91 (08/11/2006)   Problem # 3:  AODM (ICD-250.00) Assessment: Improved Stop Actos  ?reason for edema?  sugar great,so will stop Actos. The following medications were removed from the medication list:    Glyburide 2.5 Mg Tabs (Glyburide) .Marland Kitchen... Take 1 tablet by mouth every morning    Avandia 8 Mg Tabs (Rosiglitazone maleate)  Her updated medication list for this problem includes:    Metformin Hcl 500 Mg Tabs (Metformin hcl) .Marland Kitchen... Take 1 tablet by mouth in the evening    Hyzaar 100-25 Mg Tabs (  Losartan potassium-hctz) .Marland Kitchen... 1 by mouth qam    Actos 30 Mg Tabs (Pioglitazone hcl) .Marland Kitchen... 1 qd  Labs Reviewed: HgBA1c: 5.8 (11/11/2006)   Creat: 0.6 (08/11/2006)   Microalbumin: 8.9 (07/26/2006)   Problem # 4:  HYPERLIPIDEMIA, MIXED (ICD-272.2) While changing things, will switch to eneric withSimvastatin 80mg  HS after on Metoprpolol for 4-5 days successsfully. The following medications were removed from the medication list:    Caduet 5-10 Mg Tabs (Amlodipine-atorvastatin) .Marland Kitchen... Take 1 tablet by mouth every morning   Her updated medication list for this problem includes:    Simvastatin 80 Mg Tabs (Simvastatin) ..... One tab by mouth at night   Problem # 5:  HYPERTENSION, BENIGN ESSENTIAL (ICD-401.1)  The following medications were removed from the medication list:    Caduet 5-10 Mg Tabs (Amlodipine-atorvastatin) .Marland Kitchen... Take 1 tablet by mouth every morning  Her updated medication list for this problem includes:    Metoprolol Tartrate 100 Mg Tabs (Metoprolol tartrate) .Marland Kitchen... 1/2 tab by mouth at night    Hyzaar 100-25 Mg Tabs (Losartan potassium-hctz) .Marland Kitchen... 1 by mouth qam   Problem # 6:  GRIEF REACTION (ICD-309.0) Assessment: Improved Increase Xanax slightly  Complete Medication List: 1)  Buspirone Hcl 10 Mg Tabs (Buspirone hcl) .Marland Kitchen.. 1 by mouth two times a day 2)  Metoprolol Tartrate 100 Mg Tabs (Metoprolol tartrate) .... 1/2 tab by mouth at night 3)  Flomax 0.4 Mg Cp24 (Tamsulosin hcl) .Marland Kitchen.. 1 by mouth at bedtime 4)  Xanax 0.25 Mg Tabs (Alprazolam) .... Take 1 tablet by mouth every four hours 5)  Metformin Hcl 500 Mg Tabs (Metformin hcl) .... Take 1 tablet by mouth in the evening 6)  Hyzaar 100-25 Mg Tabs (Losartan potassium-hctz) .Marland Kitchen.. 1 by mouth qam 7)  Ambien 10 Mg Tabs (Zolpidem tartrate) .... As needed 8)  Alprazolam 0.5 Mg Tabs (Alprazolam) .... 1/2 tab by mouth in am, 1 at night, then 1/2 prn 9)  Actos 30 Mg Tabs (Pioglitazone hcl) .Marland Kitchen.. 1 qd 10)  Zyrtec Allergy 10 Mg Tabs (Cetirizine hcl) 11)  Wellbutrin Xl 300 Mg Tb24 (Bupropion hcl) .Marland Kitchen.. 1 q am 12)  Tums E-x 750 Mg Chew (Calcium carbonate antacid) .Marland Kitchen.. 1 qd 13)  Simvastatin 80 Mg Tabs (Simvastatin) .... One tab by mouth at night   Patient Instructions: 1)  Please schedule a follow-up appointment in 1 month. 2)  Please return for lab work before your next appointment.  3)  SGOT/SGPT 272.4     Prescriptions: ALPRAZOLAM 0.5 MG  TABS (ALPRAZOLAM) 1/2 tab by mouth in AM, 1 at night, then 1/2 prn  #60 x 12   Entered and Authorized by:    Shaune Leeks MD   Signed by:   Shaune Leeks MD on 03/24/2007   Method used:   Print then Give to Patient   RxID:   [id number] METOPROLOL TARTRATE 100 MG  TABS (METOPROLOL TARTRATE) 1/2 tab by mouth at night  #30 x 12   Entered and Authorized by:   Shaune Leeks MD   Signed by:   Shaune Leeks MD on 03/24/2007   Method used:   Print then Give to Patient   RxID:   [id number] SIMVASTATIN 80 MG  TABS (SIMVASTATIN) one tab by mouth at night  #30 x 12   Entered and Authorized by:   Shaune Leeks MD   Signed by:   Shaune Leeks MD on 03/24/2007   Method used:   Print then Give to Patient  RxID:   [id number]

## 2010-08-27 NOTE — Assessment & Plan Note (Signed)
Summary: cpx/dlo   Vital Signs:  Patient Profile:   65 Years Old Female Height:     62 inches Weight:      155 pounds Temp:     97.8 degrees F oral Pulse rate:   68 / minute Pulse rhythm:   regular BP sitting:   124 / 80  (left arm) Cuff size:   regular  Vitals Entered By: Providence Crosby (August 29, 2008 11:07 AM)                 Chief Complaint:  check up// hemocculkt cards to patient// states blood sugars for the last 3 months have run 72 to 98.  History of Present Illness: Pt here for Comp Exam, feeling ok and looking good. Glucose 72-90s typically.     Prior Medications Reviewed Using: List Brought by Patient  Current Allergies (reviewed today): ! PENICILLIN V POTASSIUM ! CODEINE SULFATE  Past Surgical History:    NSVD X 2    T AND A 1970'S    TAH BSO  DYSMENNORHEA  1988    H/O BELL'S PALSY  15 YOA 20YOA 03/1995    BLADDER TACK 11/1995    ABD U/S NML 03/1996    ECHO NML 9/97     ETT POS 08/1997    CATH NML 10/1997    FLEX SIG HEMMS O/W NML 12/1997    SEPTOPLASTY W/ ANTRAL WINDOW  ([place])  08/1998    BREAST CYST ASPIRATION  (DR YOUNG)  12/1998    BREAST BX (YOUNG) NML 05/20/2000    SLEEP STUDY DECR SLEEP TIME, DECR REM SLEEP, DESAT 86%  07/2000    ETT CARDIOLITE NML EF 75% 12/26/2000    ECHO MILD LVH,LAH,MR, TR EF 50-55%   12/26/2000    ETT CARDIOLITE NML EF 70%  09/2001    HOSP R/O'D MI  10/06/2001    PARATHYROID SCAN  NML  03/15/2002    ARTHROSCOPY R KNEE  12/12/2002    CT HEAD NML  02/10/2003    HOSP SYNCOPE W/ BILAT ELBOW FX'S, TIB PLATEAU FX R KNEE  02/10/2003    CATH NML  CAROTID U/S MILD BILAT DZ  02/12/2003    HOSP  BILAT BASILAR PE'S   8/6-03/04/2003    CATTARACT REMOVALS  R 09/24/2002   L 05/28/2003    DIABETIC MAINT REFERRAL 09/2004    TOE EXCISION, L FIFTH TOE 12/2004    ETT MYOVIEW  01/10/2006  05/2005   Family History:    Father dec 63 MI COPD ( Smoker) TB    Mother dec 70 Natural Causes    Sister A 87 DM Probs w/ leg due Brown Recluse bite, Vision  loss...legally blind  Social History:    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    Never Smoked    Alcohol use-no    Drug use-no    Review of Systems  General      Denies chills, fatigue, fever, loss of appetite, malaise, sleep disorder, sweats, weakness, and weight loss.  Eyes      Denies blurring, discharge, double vision, eye irritation, eye pain, halos, itching, light sensitivity, red eye, vision loss-1 eye, and vision loss-both eyes.  ENT      Denies decreased hearing, difficulty swallowing, ear discharge, earache, hoarseness, nasal congestion, nosebleeds, postnasal drainage, ringing in ears, sinus pressure, and sore throat.  CV      Denies bluish discoloration of lips  or nails, chest pain or discomfort, difficulty breathing at night, difficulty breathing while lying down, fainting, fatigue, leg cramps with exertion, lightheadness, near fainting, palpitations, shortness of breath with exertion, swelling of feet, swelling of hands, and weight gain.  Resp      Denies chest discomfort, chest pain with inspiration, cough, coughing up blood, excessive snoring, hypersomnolence, morning headaches, pleuritic, shortness of breath, sputum productive, and wheezing.  GI      Complains of constipation.      Denies abdominal pain, bloody stools, change in bowel habits, dark tarry stools, diarrhea, excessive appetite, gas, hemorrhoids, indigestion, loss of appetite, nausea, vomiting, vomiting blood, and yellowish skin color.      regularly  GU      Complains of nocturia.      Denies abnormal vaginal bleeding, decreased libido, discharge, dysuria, genital sores, hematuria, incontinence, urinary frequency, and urinary hesitancy.      occas once  MS      Denies joint pain, joint redness, joint swelling, loss of strength, low back pain, mid back pain, muscle aches, muscle , cramps, muscle weakness, stiffness, and thoracic pain.  Derm       Complains of dryness.      Denies changes in color of skin, changes in nail beds, excessive perspiration, flushing, hair loss, insect bite(s), itching, lesion(s), poor wound healing, and rash.  Neuro      Denies brief paralysis, difficulty with concentration, disturbances in coordination, falling down, headaches, inability to speak, memory loss, numbness, poor balance, seizures, sensation of room spinning, tingling, tremors, visual disturbances, and weakness.   Physical Exam  General:     Well-developed,well-nourished,in no acute distress; alert,appropriate and cooperative throughout examination Head:     Normocephalic and atraumatic without obvious abnormalities. No apparent alopecia or balding. Sinuses nontender. Eyes:     Conjunctiva clear bilaterally.  Ears:     External ear exam shows no significant lesions or deformities.  Otoscopic examination reveals clear canals, tympanic membranes are intact bilaterally without bulging, retraction, inflammation or discharge. Hearing is grossly normal bilaterally. Nose:     External nasal examination shows no deformity or inflammation. Nasal mucosa are pink and moist without lesions or exudates. Mouth:     Oral mucosa and oropharynx without lesions or exudates.  Teeth in good repair. Neck:     No deformities, masses, or tenderness noted. Chest Wall:     No deformities, masses, or tenderness noted. Breasts:     No mass, nodules, thickening, tenderness, bulging, retraction, inflamation, nipple discharge or skin changes noted.   Lungs:     Normal respiratory effort, chest expands symmetrically. Lungs are clear to auscultation, no crackles or wheezes. Heart:     Normal rate and regular rhythm. S1 and S2 normal without gallop, murmur, click, rub or other extra sounds. Abdomen:     Bowel sounds positive,abdomen soft and non-tender without masses, organomegaly or hernias noted. Rectal:     No external abnormalities noted. Normal sphincter tone.  No rectal masses or tenderness. G neg. Genitalia:     Bimanual only done Introitus wnl, Uterus and Cervix absent, Adnexa nontender w/o mass, ovaries not felt.  Pap Sent. Msk:     No deformity or scoliosis noted of thoracic or lumbar spine.   Pulses:     R and L carotid,radial,femoral,dorsalis pedis and posterior tibial pulses are full and equal bilaterally Extremities:     No clubbing, cyanosis, edema, or deformity noted with normal full range of motion of all joints.  Neurologic:     No cranial nerve deficits noted. Station and gait are normal. Plantar reflexes are down-going bilaterally. DTRs are symmetrical throughout. Sensory, motor and coordinative functions appear intact. Skin:     Intact without suspicious lesions or rashes, small bruise on left mid chest wall, resolving. Cervical Nodes:     No lymphadenopathy noted Axillary Nodes:     No palpable lymphadenopathy Inguinal Nodes:     No significant adenopathy Psych:     Cognition and judgment appear intact. Alert and cooperative with normal attention span and concentration. No apparent delusions, illusions, hallucinations    Impression & Recommendations:  Problem # 1:  HEALTH MAINTENANCE EXAM (ICD-V70.0) Assessment: Comment Only Tdap today.  Problem # 2:  GRIEF REACTION (ICD-309.0) Assessment: Improved Doing real well.  Problem # 3:  OBESITY, MORBID (ICD-278.01) Assessment: Improved Diong great. Everythng back to being healthy.  Problem # 4:  HYPERTENSION, BENIGN ESSENTIAL (ICD-401.1) Assessment: Unchanged  Her updated medication list for this problem includes:    Hyzaar 100-25 Mg Tabs (Losartan potassium-hctz) .Marland Kitchen... 1 each am  BP today: 124/80 Prior BP: 110/80 (02/19/2008)  Labs Reviewed: Creat: 0.8 (08/20/2008) Chol: 135 (08/20/2008)   HDL: 60.4 (08/20/2008)   LDL: 56 (08/20/2008)   TG: 93 (08/20/2008)   Problem # 5:  DISORDER, DEPRESSIVE NEC (ICD-311) Assessment: Improved Stable The following  medications were removed from the medication list:    Buspirone Hcl 10 Mg Tabs (Buspirone hcl) .Marland Kitchen... 1 by mouth two times a day  Her updated medication list for this problem includes:    Wellbutrin Xl 300 Mg Tb24 (Bupropion hcl) .Marland Kitchen... 1 q am    Alprazolam 0.5 Mg Tabs (Alprazolam) .Marland Kitchen... 1 two times a day   Problem # 6:  HYPERLIPIDEMIA, MIXED (ICD-272.2) Assessment: Improved Great. Her updated medication list for this problem includes:    Simvastatin 80 Mg Tabs (Simvastatin) ..... One tab by mouth at night  Labs Reviewed: Chol: 135 (08/20/2008)   HDL: 60.4 (08/20/2008)   LDL: 56 (08/20/2008)   TG: 93 (08/20/2008) SGOT: 18 (08/20/2008)   SGPT: 17 (08/20/2008)   Problem # 7:  AODM (ICD-250.00) Assessment: Improved Great. Her updated medication list for this problem includes:    Metformin Hcl 500 Mg Tabs (Metformin hcl) .Marland Kitchen... Take 1 tablet by mouth in the evening    Hyzaar 100-25 Mg Tabs (Losartan potassium-hctz) .Marland Kitchen... 1 each am  Labs Reviewed: HgBA1c: 5.6 (08/20/2008)   Creat: 0.8 (08/20/2008)   Microalbumin: 1.9 (08/20/2008)   Complete Medication List: 1)  Metformin Hcl 500 Mg Tabs (Metformin hcl) .... Take 1 tablet by mouth in the evening 2)  Wellbutrin Xl 300 Mg Tb24 (Bupropion hcl) .Marland Kitchen.. 1 q am 3)  Accu-chek Compact Test Drum Strp (Glucose blood) .... Test blood twice daily 4)  Alprazolam 0.5 Mg Tabs (Alprazolam) .Marland Kitchen.. 1 two times a day 5)  Hyzaar 100-25 Mg Tabs (Losartan potassium-hctz) .Marland Kitchen.. 1 each am 6)  Simvastatin 80 Mg Tabs (Simvastatin) .... One tab by mouth at night   Patient Instructions: 1)  RTC 6 mos, A1C prior 2)  H1N1 today.   Prescriptions: HYZAAR 100-25 MG  TABS (LOSARTAN POTASSIUM-HCTZ) 1 each am  #90 x 3   Entered by:   Providence Crosby   Authorized by:   Shaune Leeks MD   Signed by:   Providence Crosby on 08/29/2008   Method used:   Print then Give to Patient   RxID:   [id number] ALPRAZOLAM 0.5 MG  TABS (ALPRAZOLAM) 1 two times  a day  #180 x 0    Entered by:   Providence Crosby   Authorized by:   Shaune Leeks MD   Signed by:   Providence Crosby on 08/29/2008   Method used:   Print then Give to Patient   RxID:   [id number]   Appended Document: cpx/dlo        Current Allergies: ! PENICILLIN V POTASSIUM ! CODEINE SULFATE        Complete Medication List: 1)  Metformin Hcl 500 Mg Tabs (Metformin hcl) .... Take 1 tablet by mouth in the evening 2)  Wellbutrin Xl 300 Mg Tb24 (Bupropion hcl) .Marland Kitchen.. 1 q am 3)  Accu-chek Compact Test Drum Strp (Glucose blood) .... Test blood twice daily 4)  Alprazolam 0.5 Mg Tabs (Alprazolam) .Marland Kitchen.. 1 two times a day 5)  Hyzaar 100-25 Mg Tabs (Losartan potassium-hctz) .Marland Kitchen.. 1 each am 6)  Simvastatin 80 Mg Tabs (Simvastatin) .... One tab by mouth at night       Tetanus/Td Vaccine    Vaccine Type: Tdap    Site: left deltoid    Mfr: GlaxoSmithKline    Dose: 0.5 ml    Route: IM    Given by: Providence Crosby    Exp. Date: 04/10/2010    Lot #: [id number]    VIS given: 06/13/07 version given August 29, 2008. Vaccine Type: H1N1 vaccine G code   H1N1 # 1    Vaccine Type: H1N1 vaccine G code    Site: right deltoid    Mfr: Sanofi Pasteur    Dose: 0.5 ml    Route: IM    Given by: Providence Crosby    Exp. Date: 12/09/2009    Lot #: [id number]    VIS given: 04/25/2009 given August 29, 2008.  Vaccine Consent Questions for Tetanus    Do you have a history of severe allergic reactions to this vaccine? no    Any prior history of allergic reactions to egg and/or gelatin? no    Do you currently have an acute febrile illness? no    Have you ever had a severe reaction to latex? no    Patient is moderately or severely ill? no    Vaccine information given and explained to patient? yes    Are you currently pregnant? no

## 2010-08-27 NOTE — Assessment & Plan Note (Signed)
Summary: Follow up with blood sugars   Vital Signs:  Patient profile:   65 year old female Height:      62 inches Weight:      159 pounds BMI:     29.19 Temp:     97.7 degrees F oral Pulse rate:   72 / minute Pulse rhythm:   regular BP sitting:   120 / 80  (left arm) Cuff size:   regular  Vitals Entered By: Providence Crosby (Nov 25, 2008 8:07 AM) CC: followup blood sugars   History of Present Illness: Pt here to followup blood sugars after stopping Metformin one month ago. She was taking Metformin at night and her AM nos. are minimally elevated. Her nos. in general are less than 100 all the time.          Today her sinuses have bothered her for about two weeks. She has been taking generic Zyrtec. She has periorbital headach and pressure. No fever or chills, some eye burning, No cough, no ST, minimal nasal discharge.  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 Take 1 Tablet By Mouth in The Evening 2)  Wellbutrin Xl 300 Mg  Tb24 (Bupropion Hcl) .Marland Kitchen.. 1 Q Am 3)  Accu-Chek Compact Test Drum   Strp (Glucose Blood) .... Test Blood Twice Daily 4)  Alprazolam 0.5 Mg  Tabs (Alprazolam) .Marland Kitchen.. 1 Two Times A Day 5)  Hyzaar 100-25 Mg  Tabs  (Losartan Potassium-Hctz) .Marland Kitchen.. 1 Each Am 6)  Simvastatin 80 Mg  Tabs (Simvastatin) .... One Tab By Mouth At Night 7)  Accu-Chek Softclix Lancets  Misc (Lancets) .... Use Daily As Directed  250.00 Icd-9 Code  Allergies: 1)  ! Penicillin V Potassium 2)  ! Codeine Sulfate  Physical Exam  General:  Well-developed,well-nourished,in no acute distress; alert,appropriate and cooperative throughout examination, nontoxic. Head:  Normocephalic and atraumatic without obvious abnormalities. No apparent alopecia or balding. Sinuses tender in frontal distribution. Eyes:  Conjunctiva clear bilaterally.  Ears:  External ear exam shows no significant lesions or deformities.  Otoscopic examination reveals clear canals, tympanic membranes are intact bilaterally without bulging, retraction, inflammation or discharge. Hearing is grossly normal bilaterally. Nose:  External nasal examination shows no deformity or inflammation. Nasal mucosa are pink and moist without lesions or exudates. Mouth:  Oral mucosa and oropharynx without lesions or exudates.  Teeth in good repair. Neck:  No deformities, masses, or tenderness noted. Chest Wall:  No deformities, masses, or tenderness noted. Lungs:  Normal respiratory effort, chest expands symmetrically. Lungs are clear to auscultation, no crackles or wheezes. Heart:  Normal rate and regular rhythm. S1 and S2 normal without gallop, murmur, click, rub or other extra sounds.   Impression & Recommendations:  Problem # 1:  AODM (ICD-250.00) Assessment Unchanged  Stable and  well controlled. Sugars no change having stopped Metformin. Cont in that regard. Her updated medication list for this problem includes:    Metformin Hcl 500 Mg Tabs (Metformin hcl) .Marland Kitchen... Take 1 tablet by mouth in the evening    Hyzaar 100-25 Mg Tabs (Losartan potassium-hctz) .Marland Kitchen... 1 each am  Labs Reviewed: Creat: 0.8 (08/20/2008)   Microalbumin: 8.9 (07/26/2006) Reviewed HgBA1c results: 5.6  (08/20/2008)  5.5 (02/14/2008)  Problem # 2:  SINUSITIS - ACUTE-NOS (ICD-461.9) Assessment: New  See instructions. Her updated medication list for this problem includes:    Zithromax Z-pak 250 Mg Tabs (Azithromycin) .Marland Kitchen... As dir  Instructed on treatment. Call if symptoms persist or worsen.   Problem # 3:  OBESITY, MORBID (ICD-278.01) Assessment: Deteriorated  Slowly gaining weight again. To see Dr Johna Sheriff this week. Encouraged to exercise abnd watch intake.  Ht: 62 (11/25/2008)   Wt: 159 (11/25/2008)   BMI: 29.19 (11/25/2008)  Complete Medication List: 1)  Metformin Hcl 500 Mg Tabs (Metformin hcl) .... Take 1 tablet by mouth in the evening 2)  Wellbutrin Xl 300 Mg Tb24 (Bupropion hcl) .Marland Kitchen.. 1 q am 3)  Accu-chek Compact Test Drum Strp (Glucose blood) .... Test blood twice daily 4)  Alprazolam 0.5 Mg Tabs (Alprazolam) .Marland Kitchen.. 1 two times a day 5)  Hyzaar 100-25 Mg Tabs (Losartan potassium-hctz) .Marland Kitchen.. 1 each am 6)  Simvastatin 80 Mg Tabs (Simvastatin) .... One tab by mouth at night 7)  Accu-chek Softclix Lancets Misc (Lancets) .... Use daily as directed  250.00 icd-9 code 8)  Centrum Silver Tabs (Multiple vitamins-minerals) .Marland Kitchen.. 1 daily by mouth 9)  Zithromax Z-pak 250 Mg Tabs (Azithromycin) .... As dir  Patient Instructions: 1)  Take Guaifenesin by going to [facility] or [facility] and getting MUCOUS RELIEF EXPECTORANT (400mg ), take 11/2 tabs by mouth AM and NOON. 2)  Drink lots of fluids anytime taking Guaifenesin.  3)  If no impr by Thu, cont above and add Zithromax. Prescriptions: ZITHROMAX Z-PAK 250 MG TABS (AZITHROMYCIN) as dir  #1 pak x 0   Entered and Authorized by:   Shaune Leeks MD   Signed by:   Shaune Leeks MD on 11/25/2008   Method used:   Print then Give to Patient   RxID:   [id number] ALPRAZOLAM 0.5 MG  TABS (ALPRAZOLAM) 1 two times a day  #180 x 3   Entered by:   Providence Crosby   Authorized by:   Shaune Leeks MD   Signed by:    Providence Crosby on 11/25/2008   Method used:   Print then Give to Patient   RxID:   [id number]

## 2010-08-27 NOTE — Progress Notes (Signed)
  Phone Note Call from Patient   Caller: Patient Call For: DR So [facility] Summary of Call: PATIENT STATES [employer] MAILED HER ACCOLATE THROUGH THE MAIL AND THAT DR.Koty Anctil ORDERED IT// AND THAT SHE WOULD HAVE TO PAY FOR IT. INSTRUCTED PATIENT NO WERE ON HER CHART WAS A PRESCRIBTION FOR ACCOLATE FYI NOTE Initial call taken by: Providence Crosby,  June 29, 2007 8:54 AM  Follow-up for Phone Call        noted.

## 2010-08-27 NOTE — Progress Notes (Signed)
  Phone Note From Other Clinic   Caller: Clydie Braun from [facility] Details for Reason: ifob  Summary of Call: ifob collected incorrectly, needs to be recollected. Initial call taken by: Mills Koller,  April 23, 2010 9:19 AM  Follow-up for Phone Call        So I ordered wrong?  Please let me know because I just ordered another one. [name] MD  April 23, 2010 9:38 AM   Additional Follow-up for Phone Call Additional follow up Details #1::        No, Patient didn't collect sample right. Either dumped liquid out of container, or put to much stool in.  Additional Follow-up by: Mills Koller,  April 23, 2010 9:40 AM    Additional Follow-up for Phone Call Additional follow up Details #2::    Ok thank you. [name] MD  April 23, 2010 9:41 AM

## 2010-08-27 NOTE — Letter (Signed)
Summary: Results Follow up Letter  [facility]  [address] [place], [place] [address]   Phone: [phone]  Fax: [phone]    09/02/2008 MRN: [id number]  [name] [address] [place], [place]  [address]  Dear [name],  The following are the results of your recent test(s):  Test         Result    Pap Smear:        Normal __X___  Not Normal _____ Comments:   Your papsmear report was normal. Please make sure to repeat your physical in one year. ______________________________________________________ Cholesterol: LDL(Bad cholesterol):         Your goal is less than:         HDL (Good cholesterol):       Your goal is more than: Comments:  ______________________________________________________ Mammogram:        Normal _____  Not Normal _____ Comments:  ___________________________________________________________________ Hemoccult:        Normal _____  Not normal _______ Comments:    _____________________________________________________________________ Other Tests:    We routinely do not discuss normal results over the telephone.  If you desire a copy of the results, or you have any questions about this information we can discuss them at your next office visit.   Sincerely,

## 2010-08-27 NOTE — Progress Notes (Signed)
Summary: side effect from med  Phone Note Call from Patient Call back at [facility] Phone [phone] Call back at Work Phone [phone]   Caller: Patient Call For: schaller Summary of Call: pt was prescribed metoprolol tartrate 100mg  1/2 tabs at bedtime, pt took med for 2 wks and did not have any dizziness, but then it started and got so severe pt was falling, she says she cannot take med anymore please advise what she is to do? Initial call taken by: Liane Comber,  June 08, 2007 10:07 AM  Follow-up for Phone Call        Probabbly got her BP too far down.  Stay off med, avoid salt and come in to assess next week...with her continued weight loss [facility]!) she may not need BP med but need to assess here first. Follow-up by: Shaune Leeks MD,  June 08, 2007 10:43 AM  Additional Follow-up for Phone Call Additional follow up Details #1::        Advised patient. she is really tied down with work so she says it will be the folling week before she can come in she scheduled appt for nov 24.......................................................Marland KitchenLiane Comber June 08, 2007 11:50 AM OK, thanks Additional Follow-up by: Shaune Leeks MD,  June 08, 2007 3:07 PM

## 2010-08-27 NOTE — Miscellaneous (Signed)
Summary: [facility]/[employer]/Frank Tiburcio Bash  [facility]/[employer]/Frank Azucena Cecil RPh   Imported By: Eleonore Chiquito 11/21/2007 15:52:24  _____________________________________________________________________  External Attachment:    Type:   Image     Comment:   External Document  Appended Document: [facility]/[employer]/Frank Tiburcio Bash    Clinical Lists Changes

## 2010-08-27 NOTE — Assessment & Plan Note (Signed)
Summary: F/U [facility]  D/C 09/10/09/[facility]   Vital Signs:  Patient profile:   65 year old female Weight:      154.25 pounds Temp:     98.4 degrees F oral Pulse rate:   64 / minute Pulse rhythm:   regular BP supine:   140 / 90  (left arm) BP sitting:   130 / 84  (left arm) BP standing:   124 / 80  (left arm) Cuff size:   large  Vitals Entered By: [name] LPN (September 11, 2009 11:20 AM) CC: Hospital follow-up   History of Present Illness: [name] is here as hosp followup having fallen in the bathtub from syncope which has had no cause found.(See D/C summ) She is baseline from that but continues to have right arm radicular pain for which she was seen a few days prior to the syncope. 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 D/c summ notes I am to get Formal reading of Carotid Duplex, discuss and decide on poss neuro referral for eval, check BP as she was taken off all BP meds, Check lytes as her K was significantly low in the hosp .  Problems Prior to Update: 1)  Arm Pain, Right  (ICD-729.5) 2)  Hx of Metabolic Syndrome X  (ICD-277.7) 3)  Deep Venous Thrombophlebitis, Hx of Bilat  (ICD-V12.52) 4)  Hypertension, Benign Essential  (ICD-401.1) 5)  Obesity, Morbid  (ICD-278.01) 6)  Hyperlipidemia, Mixed  (ICD-272.2) 7)  Abnormal Electrocardiogram, Prolonged Qt Segment  (ICD-794.31) 8)  Special Screening Malig Neoplasms Other Sites  (ICD-V76.49) 9)  Health Maintenance Exam  (ICD-V70.0) 10)  Other Screening Mammogram  (ICD-V76.12) 11)  Disorder, Depressive Nec  (ICD-311) 12)  Aodm  (ICD-250.00) 13)  Bell's Palsy, Left , Recurrent  (ICD-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 1 Q Am 2)  Accu-Chek Compact Test Drum   Strp (Glucose Blood) .... Test Blood Twice Daily 3)  Alprazolam 0.5 Mg  Tabs (Alprazolam) .Marland Kitchen.. 1 Two Times A Day 4)  Losartan Potassium-Hctz 100-25 Mg Tabs (Losartan Potassium-Hctz) .Marland Kitchen.. 1 Tab Once Daily 5)  Simvastatin 80 Mg  Tabs (Simvastatin) .... One Tab By Mouth At Night 6)  Accu-Chek Softclix Lancets  Misc (Lancets) .... Use Daily As Directed  250.00 Icd-9 Code 7)  Centrum Silver  Tabs (Multiple Vitamins-Minerals) .Marland Kitchen.. 1 Daily By Mouth 8)  Aspirin 81 Mg Tbec (Aspirin) .... Take One Tablet By Mouth Daily 9)  Vitamin B-12 1000 Mcg Tabs (Cyanocobalamin) .Marland Kitchen.. 1 Tab Once Daily 10)  Tramadol Hcl 50 Mg Tabs (Tramadol Hcl) .Marland Kitchen.. 1 Tab By Mouth Q6 Hours As Needed Pain  Allergies: 1)  ! Penicillin V Potassium 2)  ! Codeine Sulfate  Physical Exam  General:  overweight female in NAD Head:  Normocephalic and atraumatic without obvious abnormalities. No apparent alopecia or balding. Sinuses NT. Eyes:  Conjunctiva clear bilaterally.  Ears:  External ear exam shows no significant lesions or deformities.  Otoscopic examination reveals clear canals, tympanic membranes are  intact bilaterally without bulging, retraction, inflammation or discharge. Hearing is grossly normal bilaterally. Nose:  External nasal examination shows no deformity or inflammation. Nasal mucosa are pink and moist without lesions or exudates. Mouth:  Oral mucosa and oropharynx without lesions or exudates.  Teeth in good repair. Neck:  no carotid bruit or thyromegaly no cervical or supraclavicular lymphadenopathy Right ttp over lateral neck, neg Spurling, decrease ROM due to pain Chest Wall:  No deformities, masses, or tenderness noted. Lungs:  Normal respiratory effort, chest expands symmetrically.  Lungs are clear to auscultation, no crackles or wheezes. Heart:  Normal rate and regular rhythm. S1 and S2 normal without gallop, murmur, click, rub or other extra sounds. Abdomen:  Bowel sounds positive,abdomen soft and non-tender without masses, organomegaly or hernias noted. Msk:  Exquisitely tender to palp medial to right scapula with no swelling redness or fluctulance. Vertebrae NT. Neurologic:  No cranial nerve deficits noted. Station and gait are normal for her.  DTRs are symmetrical throughout. Sensory, motor and coordinative functions appear intact.   Impression & Recommendations:  Problem # 1:  HYPOKALEMIA (ICD-276.8) Assessment New Will recheck. Cont curr meds. Orders: Venipuncture (98119) TLB-BMP (Basic Metabolic Panel-BMET) (80048-METABOL)  Problem # 2:  ARM PAIN, RIGHT (ICD-729.5) Assessment: Unchanged Assume due to vertebral compromise. Will try prednisone pulse for 5 days and reeval Mon. Refer Mon as needed. Orders: TLB-CBC Platelet - w/Differential (85025-CBCD)  Problem # 3:  HYPERTENSION, BENIGN ESSENTIAL (ICD-401.1) Assessment: Unchanged Seems stable. Will cont to follow. The following medications were removed from the medication list:    Losartan Potassium-hctz 100-25 Mg Tabs (Losartan potassium-hctz) .Marland Kitchen... 1 tab once daily  BP today: 130/84 Prior BP: 120/84 (09/05/2009)  Labs Reviewed: K+: 3.8 (09/01/2009) Creat: : 0.8 (09/01/2009)   Chol: 149 (09/01/2009)   HDL: 65.00 (09/01/2009)   LDL: 63 (09/01/2009)   TG: 104.0 (09/01/2009)  Problem # 4:  HYPOTENSION, ORTHOSTATIC (ICD-458.0) Assessment: New Appears resolved...will recheck Mon.  Complete Medication List: 1)  Wellbutrin Xl 300 Mg Tb24 (Bupropion hcl) .... Take one by mouth every am 2)  Alprazolam 0.5 Mg Tabs (Alprazolam) .Marland Kitchen.. 1 two times a day 3)  Simvastatin 80 Mg Tabs (Simvastatin) .... One tab by mouth at night 4)  Centrum Silver Tabs (Multiple vitamins-minerals) .Marland Kitchen.. 1 daily by mouth 5)  Aspirin  81 Mg Tbec (Aspirin) .... Take one tablet by mouth daily 6)  Vitamin B-12 1000 Mcg Tabs (Cyanocobalamin) .Marland Kitchen.. 1 tab once daily 7)  Metaxalone 800 Mg Tabs (Metaxalone) .... Take one by mouth three times a day 8)  Prednisone 50 Mg Tabs (Prednisone) .... One tab by mouth in am 9)  Vicodin 5-500 Mg Tabs (Hydrocodone-acetaminophen) .... One tab by mouth three times a day as needed pain  Patient Instructions: 1)  RTC Mon for followup. 2)  Take Tyl regularly as directed. Prescriptions: VICODIN 5-500 MG TABS (HYDROCODONE-ACETAMINOPHEN) one tab by mouth three times a day as needed pain  #30 x 0   Entered and Authorized by:   Shaune Leeks MD   Signed by:   Shaune Leeks MD on 09/11/2009   Method used:   Print then Give to Patient   RxID:   [id number] PREDNISONE 50 MG TABS (PREDNISONE) one tab by mouth in AM  #10 x 0   Entered and Authorized by:   Shaune Leeks MD   Signed by:   Shaune Leeks MD on 09/11/2009   Method used:   Print then Give to Patient   RxID:  [id number]   Current Allergies (reviewed today): ! PENICILLIN V POTASSIUM ! CODEINE SULFATE

## 2010-08-27 NOTE — Letter (Signed)
Summary: [facility]: Immunoassay Fecal Occult Blood (iFOB) Order Form  [place] [facility]: [address]  [address] [place], [place] [address]   Phone: [phone]  Fax: [phone]      [facility]: Immunoassay Fecal Occult Blood (iFOB) Order Form   April 16, 2010 MRN: [id number]   CHAYLA SHANDS 10-01-1945   Physicican Name:___Talia Aron______________________  Diagnosis Code:______792.1____________________      [name] MD

## 2010-08-27 NOTE — Assessment & Plan Note (Signed)
Summary: 6 MONTH FOLLOW UP / LFW   Vital Signs:  Patient profile:   65 year old female Height:      62 inches Weight:      159.25 pounds BMI:     29.23 Temp:     97.9 degrees F oral Pulse rate:   80 / minute Pulse rhythm:   regular BP sitting:   136 / 80  (left arm) Cuff size:   large  Vitals Entered By: Lewanda Rife LPN (March 13, 2009 8:15 AM)  History of Present Illness: Pt here for Comp Exam, having hard week as [place] died two weeks ago this week. Sugars doing well.  She feels great. She will be goping on vacation next week with her in-laws to the [place] in [place].. She is looking forward to that. She has not gained weight from last time, Dr Johna Sheriff made small adjustment to the band.  Problems Prior to Update: 1)  Hx of Metabolic Syndrome X  (ICD-277.7) 2)  Deep Venous Thrombophlebitis, Hx of Bilat  (ICD-V12.52) 3)  Hypertension, Benign Essential  (ICD-401.1) 4)  Obesity, Morbid  (ICD-278.01) 5)  Hyperlipidemia, Mixed  (ICD-272.2) 6)  Abnormal Electrocardiogram, Prolonged Qt Segment  (ICD-794.31) 7)  Special Screening Malig Neoplasms Other Sites  (ICD-V76.49) 8)  Health Maintenance Exam  (ICD-V70.0) 9)  Other Screening Mammogram  (ICD-V76.12) 10)  Disorder, Depressive Nec  (ICD-311) 11)  Aodm  (ICD-250.00) 12)  Bell's Palsy, Right, Recurrent  (ICD-351.0) 13)  Postmenopausal On Hormone Replacement Therapy  (ICD-V07.4) 14)  Allergy  (ICD-995.3) 15)  Grief Reaction  (ICD-309.0) 16)  Need Prophylactic Vaccination&inoculation Flu  (ICD-V04.81)  Medications Prior to Update: 1)  Metformin Hcl 500 Mg Tabs (Metformin Hcl) .... Take 1 Tablet By Mouth in The Evening 2)  Wellbutrin Xl 300 Mg  Tb24 (Bupropion Hcl) .Marland Kitchen.. 1 Q Am 3)  Accu-Chek Compact Test Drum   Strp (Glucose Blood) .... Test Blood Twice Daily 4)  Alprazolam 0.5 Mg  Tabs (Alprazolam) .Marland Kitchen.. 1 Two Times A Day 5)  Losartan Potassium-Hctz 100-25 Mg Tabs (Losartan Potassium-Hctz) .Marland Kitchen.. 1 Tab Once Daily 6)   Simvastatin 80 Mg  Tabs (Simvastatin) .... One Tab By Mouth At Night 7)  Accu-Chek Softclix Lancets  Misc (Lancets) .... Use Daily As Directed  250.00 Icd-9 Code 8)  Centrum Silver  Tabs (Multiple Vitamins-Minerals) .Marland Kitchen.. 1 Daily By Mouth 9)  Aspirin 81 Mg Tbec (Aspirin) .... Take One Tablet By Mouth Daily 10)  Vitamin B-12 1000 Mcg Tabs (Cyanocobalamin) .Marland Kitchen.. 1 Tab Once Daily  Allergies: 1)  ! Penicillin V Potassium 2)  ! Codeine Sulfate  Physical Exam  General:  Well-developed,well-nourished,in no acute distress; alert,appropriate and cooperative throughout examination, nontoxic. Head:  Normocephalic and atraumatic without obvious abnormalities. No apparent alopecia or balding. Sinuses tender in frontal distribution. Eyes:  Conjunctiva clear bilaterally.  Ears:  External ear exam shows no significant lesions or deformities.  Otoscopic examination reveals clear canals, tympanic membranes are intact bilaterally without bulging, retraction, inflammation or discharge. Hearing is grossly normal bilaterally. Nose:  External nasal examination shows no deformity or inflammation. Nasal mucosa are pink and moist without lesions or exudates. Mouth:  Oral mucosa and oropharynx without lesions or exudates.  Teeth in good repair. Neck:  No deformities, masses, or tenderness noted. Chest Wall:  No deformities, masses, or tenderness noted. Lungs:  Normal respiratory effort, chest expands symmetrically. Lungs are clear to auscultation, no crackles or wheezes. Heart:  Normal rate and regular rhythm. S1 and S2  normal without gallop, murmur, click, rub or other extra sounds.   Impression & Recommendations:  Problem # 1:  HYPERTENSION, BENIGN ESSENTIAL (ICD-401.1) Assessment Unchanged  Her updated medication list for this problem includes:    Losartan Potassium-hctz 100-25 Mg Tabs (Losartan potassium-hctz) .Marland Kitchen... 1 tab once daily  BP today: 136/80 Prior BP: 122/78 (02/25/2009)  Labs Reviewed: K+: 4.2  (08/20/2008) Creat: : 0.8 (08/20/2008)   Chol: 135 (08/20/2008)   HDL: 60.4 (08/20/2008)   LDL: 56 (08/20/2008)   TG: 93 (08/20/2008)  Problem # 2:  OBESITY, MORBID (ICD-278.01) Assessment: Unchanged  Stable.  Ht: 62 (03/13/2009)   Wt: 159.25 (03/13/2009)   BMI: 29.23 (03/13/2009)  Problem # 3:  AODM (ICD-250.00) Assessment: Unchanged Great control, cont as is. The following medications were removed from the medication list:    Metformin Hcl 500 Mg Tabs (Metformin hcl) .Marland Kitchen... Take 1 tablet by mouth in the evening Her updated medication list for this problem includes:    Losartan Potassium-hctz 100-25 Mg Tabs (Losartan potassium-hctz) .Marland Kitchen... 1 tab once daily    Aspirin 81 Mg Tbec (Aspirin) .Marland Kitchen... Take one tablet by mouth daily  Labs Reviewed: Creat: 0.8 (08/20/2008)   Microalbumin: 8.9 (07/26/2006) Reviewed HgBA1c results: 5.6 (03/10/2009)  5.6 (08/20/2008)  Complete Medication List: 1)  Wellbutrin Xl 300 Mg Tb24 (Bupropion hcl) .Marland Kitchen.. 1 q am 2)  Accu-chek Compact Test Drum Strp (Glucose blood) .... Test blood twice daily 3)  Alprazolam 0.5 Mg Tabs (Alprazolam) .Marland Kitchen.. 1 two times a day 4)  Losartan Potassium-hctz 100-25 Mg Tabs (Losartan potassium-hctz) .Marland Kitchen.. 1 tab once daily 5)  Simvastatin 80 Mg Tabs (Simvastatin) .... One tab by mouth at night 6)  Accu-chek Softclix Lancets Misc (Lancets) .... Use daily as directed  250.00 icd-9 code 7)  Centrum Silver Tabs (Multiple vitamins-minerals) .Marland Kitchen.. 1 daily by mouth 8)  Aspirin 81 Mg Tbec (Aspirin) .... Take one tablet by mouth daily 9)  Vitamin B-12 1000 Mcg Tabs (Cyanocobalamin) .Marland Kitchen.. 1 tab once daily  Patient Instructions: 1)  RTC 2/11 for Comp Exam, labs prior. 2)  Dr Daleen Squibb suggested Dr Patsy Lager to assume care in Jun. Prescriptions: SIMVASTATIN 80 MG  TABS (SIMVASTATIN) one tab by mouth at night  #90 x 3   Entered and Authorized by:   Shaune Leeks MD   Signed by:   Shaune Leeks MD on 03/13/2009   Method used:   Print then  Give to Patient   RxID:   [id number] LOSARTAN POTASSIUM-HCTZ 100-25 MG TABS (LOSARTAN POTASSIUM-HCTZ) 1 tab once daily  #90 x 3   Entered and Authorized by:   Shaune Leeks MD   Signed by:   Shaune Leeks MD on 03/13/2009   Method used:   Print then Give to Patient   RxID:   [id number] ALPRAZOLAM 0.5 MG  TABS (ALPRAZOLAM) 1 two times a day  #180 x 3   Entered and Authorized by:   Shaune Leeks MD   Signed by:   Shaune Leeks MD on 03/13/2009   Method used:   Print then Give to Patient   RxID:   [id number] WELLBUTRIN XL 300 MG  TB24 (BUPROPION HCL) 1 Q AM  #90 x 3   Entered and Authorized by:   Shaune Leeks MD   Signed by:   Shaune Leeks MD on 03/13/2009   Method used:   Print then Give to Patient   RxID:   [id number]   Current Allergies (reviewed today): ! PENICILLIN  V POTASSIUM ! CODEINE SULFATE

## 2010-08-27 NOTE — Progress Notes (Signed)
Summary: labs  Phone Note Call from Patient Call back at [phone]   Caller: Patient Call For: schaller Summary of Call: pt having labs on thursday, she wants to make sure she gets cbc, A1c, and lft's checked. she see's you on 05/08/07 Initial call taken by: Liane Comber,  May 01, 2007 2:00 PM    Please order as requested...V12.52, 250.00,272.0 respectively.                                             CBC, A1C    , SGOT,SGPT.

## 2010-08-27 NOTE — Progress Notes (Signed)
----   Converted from flag ---- ---- 04/09/2010 9:50 AM, [name] MD wrote: BMET (401.1), a1c (250.00), fasting lipid panel (272.4)  ---- 04/09/2010 9:41 AM, Liane Comber CMA (AAMA) wrote: Lab orders please! Good Morning! This pt is scheduled for cpx labs Tuesday, which labs to draw and dx codes to use? Thanks Tasha ------------------------------

## 2010-08-27 NOTE — Letter (Signed)
Summary: Nadara Eaton letter  [place] at [facility]  [address] [place], [place] [address]   Phone: [phone]  Fax: [phone]       03/03/2010 MRN: [id number]  DNYLA ANTONETTI [address] [place], [place]  [address]  Dear Ms. [facility] [place] and [place] announce the retirement of Arta Silence, M.D., from full-time practice at the [facility] office effective January 22, 2010 and his plans of returning part-time.  It is important to Dr. Hetty Ely and to our practice that you understand that [facility] has seven physicians in our office for your health care needs.  We will continue to offer the same exceptional care that you have today.    Dr. Hetty Ely has spoken to many of you about his plans for retirement and returning part-time in the fall.   We will continue to work with you through the transition to schedule appointments for you in the office and meet the high standards that [place] is committed to.   Again, it is with great pleasure that we share the news that Dr. Hetty Ely will return to [facility] at [facility] in October of 2011 with a reduced schedule.    If you have any questions, or would like to request an appointment with one of our physicians, please call us at [phone] and press the option for Scheduling an appointment.  We take pleasure in providing you with excellent patient care and look forward to seeing you at your next office visit.  [facility] Physicians are:  Tillman Abide, M.D. Laurita Quint, M.D. Roxy Manns, M.D. Kerby Nora, M.D. Hannah Beat, M.D. [name], M.D. We proudly welcomed Raechel Ache, M.D. and Eustaquio Boyden, M.D. to the practice in July/August 2011.  Sincerely,  [place] [facility]

## 2010-08-27 NOTE — Assessment & Plan Note (Signed)
Summary: RTC PER MD/DLO   Vital Signs:  Patient profile:   65 year old female Weight:      156.75 pounds Temp:     98.2 degrees F oral Pulse rate:   72 / minute Pulse rhythm:   regular BP sitting:   134 / 84  (left arm) Cuff size:   large  Vitals Entered By: [name] LPN (September 15, 2009 11:25 AM) CC: 4 Day follow-up and wants written rx for Alprazolam for her mail order   History of Present Illness: Pt here for followup from last week after syncope hospitalization without cause found. She feels better every day and today is about back to normal. She was taken off her BP meds in the hosp and is slowly creeping up to elevated BP. She is not othostatic today and has not been dizzy. Sheis eating well and back sleeping well. Her shoulder is improved on Prednisone.   Problems Prior to Update: 1)  Hypotension, Orthostatic  (ICD-458.0) 2)  Hypokalemia  (ICD-276.8) 3)  Arm Pain, Right  (ICD-729.5) 4)  Hx of Metabolic Syndrome X  (ICD-277.7) 5)  Deep Venous Thrombophlebitis, Hx of Bilat  (ICD-V12.52) 6)  Hypertension, Benign Essential  (ICD-401.1) 7)  Obesity, Morbid  (ICD-278.01) 8)  Hyperlipidemia, Mixed  (ICD-272.2) 9)  Abnormal Electrocardiogram, Prolonged Qt Segment  (ICD-794.31) 10)  Special Screening Malig Neoplasms Other Sites  (ICD-V76.49) 11)  Health Maintenance Exam  (ICD-V70.0) 12)  Other Screening Mammogram  (ICD-V76.12) 13)  Disorder, Depressive Nec  (ICD-311) 14)  Aodm  (ICD-250.00) 15)  Bell's Palsy, Left , Recurrent  (ICD-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 Take One By Mouth Every Am 2)  Alprazolam 0.5 Mg  Tabs (Alprazolam) .Marland Kitchen.. 1 Two Times A Day 3)  Simvastatin 80 Mg  Tabs (Simvastatin) .... One Tab By Mouth At Night 4)   Centrum Silver  Tabs (Multiple Vitamins-Minerals) .Marland Kitchen.. 1 Daily By Mouth 5)  Aspirin 81 Mg Tbec (Aspirin) .... Take One Tablet By Mouth Daily 6)  Vitamin B-12 1000 Mcg Tabs (Cyanocobalamin) .Marland Kitchen.. 1 Tab Once Daily 7)  Metaxalone 800 Mg Tabs (Metaxalone) .... Take One By Mouth Three Times A Day 8)  Prednisone 50 Mg Tabs (Prednisone) .... One Tab By Mouth in Am 9)  Vicodin 5-500 Mg Tabs (Hydrocodone-Acetaminophen) .... One Tab By Mouth Three Times A Day As Needed Pain  Allergies: 1)  ! Penicillin V Potassium 2)  ! Codeine Sulfate  Physical Exam  General:  overweight female in NAD Head:  Normocephalic and atraumatic without obvious abnormalities. No apparent alopecia or balding. Sinuses NT. Eyes:  Conjunctiva clear bilaterally.  Ears:  External ear exam shows no significant lesions or deformities.  Otoscopic examination reveals clear canals, tympanic membranes are intact bilaterally without bulging, retraction, inflammation or discharge. Hearing is grossly normal bilaterally. Nose:  External nasal examination shows no deformity or inflammation. Nasal mucosa are pink and moist without lesions or exudates. Mouth:  Oral mucosa and oropharynx without lesions or exudates.  Teeth in good repair. Msk:  Mikldly tender to palp medial to right scapula with no swelling redness or fluctulance. Vertebrae NT.   Impression & Recommendations:  Problem # 1:  HYPOTENSION, ORTHOSTATIC (ICD-458.0) Assessment Improved Seems resolved. Will start low dose Hyzaar, 1/2 of her previous  pill.   Problem # 2:  HYPOKALEMIA (ICD-276.8) Assessment: Improved Normal per labs. Will recheck again in the future.  Problem # 3:  ARM PAIN, RIGHT (ICD-729.5) Assessment: Improved Improved but still bothering her. Will continue steroids by tapering, see back in 10 days.  Problem # 4:  HYPERTENSION, BENIGN ESSENTIAL (ICD-401.1) Assessment: Deteriorated Creeping up. Will restart lower dose of Prior med. BP today: 134/84 Prior  BP: 124/80 (09/11/2009)  Labs Reviewed: K+: 4.7 (09/11/2009) Creat: : 0.6 (09/11/2009)   Chol: 149 (09/01/2009)   HDL: 65.00 (09/01/2009)   LDL: 63 (09/01/2009)   TG: 104.0 (09/01/2009)  Problem # 5:  AODM (ICD-250.00) Assessment: Unchanged  Nos have remained stable even on the steroids. Will follow. Her updated medication list for this problem includes:    Aspirin 81 Mg Tbec (Aspirin) .Marland Kitchen... Take one tablet by mouth daily  Labs Reviewed: Creat: 0.6 (09/11/2009)   Microalbumin: 8.9 (07/26/2006) Reviewed HgBA1c results: 5.7 (09/01/2009)  5.6 (03/10/2009)  Complete Medication List: 1)  Wellbutrin Xl 300 Mg Tb24 (Bupropion hcl) .... Take one by mouth every am 2)  Alprazolam 0.5 Mg Tabs (Alprazolam) .Marland Kitchen.. 1 two times a day 3)  Simvastatin 80 Mg Tabs (Simvastatin) .... One tab by mouth at night 4)  Centrum Silver Tabs (Multiple vitamins-minerals) .Marland Kitchen.. 1 daily by mouth 5)  Aspirin 81 Mg Tbec (Aspirin) .... Take one tablet by mouth daily 6)  Vitamin B-12 1000 Mcg Tabs (Cyanocobalamin) .Marland Kitchen.. 1 tab once daily 7)  Metaxalone 800 Mg Tabs (Metaxalone) .... Take one by mouth three times a day 8)  Prednisone 50 Mg Tabs (Prednisone) .... One tab by mouth in am 9)  Vicodin 5-500 Mg Tabs (Hydrocodone-acetaminophen) .... One tab by mouth three times a day as needed pain 10)  Tylenol Extra Strength 500 Mg Tabs (Acetaminophen) .... As needed 11)  Prednisone 10 Mg Tabs (Prednisone) .... 4 tabs first thing in the am for 3 days, then decrease by one tab every three days, finally taking 1/2 tab for 6 days.  Patient Instructions: 1)  RTC 10 days, Bmet 401.1and A1C 250.00 prior Prescriptions: PREDNISONE 10 MG TABS (PREDNISONE) 4 tabs first thing in the AM for 3 days, then decrease by one tab every three days, finally taking 1/2 tab for 6 days.  #33 x 0   Entered and Authorized by:   Shaune Leeks MD   Signed by:   Shaune Leeks MD on 09/15/2009   Method used:   Print then Give to Patient    RxID:   [id number] ALPRAZOLAM 0.5 MG  TABS (ALPRAZOLAM) 1 two times a day  #180 x 1   Entered by:   [name] LPN   Authorized by:   Shaune Leeks MD   Signed by:   [name] LPN on 14/78/2956   Method used:   Print then Give to Patient   RxID:   [id number]   Current Allergies (reviewed today): ! PENICILLIN V POTASSIUM ! CODEINE SULFATE

## 2010-08-27 NOTE — Assessment & Plan Note (Signed)
Summary: CPX / LFW   Vital Signs:  Patient profile:   65 year old female Weight:      152.50 pounds Temp:     98.8 degrees F oral Pulse rate:   84 / minute Pulse rhythm:   regular BP sitting:   130 / 78  (left arm) Cuff size:   large  Vitals Entered By: [name] LPN (September 29, 1608 10:57 AM) CC: 30 Minute checkup, hemoccult cards given to patient, has had a hysterectomy, Preventive Care   History of Present Illness: pt here for Comp Exam. Is still currently on Prednisone, now 20mg  and will be on 10 starting tomm. On slow taper for right arm pain, presumably from nerve impingement somewhere in the neck/shoulder area. She has no complaints today except for right forearm pain that continues. Overall the discomfort is improved.  Preventive Screening-Counseling & Management  Alcohol-Tobacco     Alcohol drinks/day: 0     Smoking Status: never     Passive Smoke Exposure: no  Caffeine-Diet-Exercise     Caffeine use/day: 0     Does Patient Exercise: yes     Type of exercise: walks 30 mins      Times/week: 7  Problems Prior to Update: 1)  Hypokalemia  (ICD-276.8) 2)  Arm Pain, Right  (ICD-729.5) 3)  Hx of Metabolic Syndrome X  (ICD-277.7) 4)  Deep Venous Thrombophlebitis, Hx of Bilat  (ICD-V12.52) 5)  Hypertension, Benign Essential  (ICD-401.1) 6)  Obesity, Morbid  (ICD-278.01) 7)  Hyperlipidemia, Mixed  (ICD-272.2) 8)  Abnormal Electrocardiogram, Prolonged Qt Segment  (ICD-794.31) 9)  Special Screening Malig Neoplasms Other Sites  (ICD-V76.49) 10)  Health Maintenance Exam  (ICD-V70.0) 11)  Other Screening Mammogram  (ICD-V76.12) 12)  Disorder, Depressive Nec  (ICD-311) 13)  Aodm  (ICD-250.00) 14)  Bell's Palsy, Left , 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 Take One By Mouth Every Am 2)  Alprazolam 0.5 Mg  Tabs (Alprazolam) .Marland Kitchen.. 1 Two Times A Day 3)  Simvastatin 80 Mg  Tabs (Simvastatin) .... One Tab By Mouth At Night 4)  Centrum Silver  Tabs (Multiple Vitamins-Minerals) .Marland Kitchen.. 1 Daily By Mouth 5)  Aspirin 81 Mg Tbec (Aspirin) .... Take One Tablet By Mouth Daily 6)  Vitamin B-12 1000 Mcg Tabs (Cyanocobalamin) .Marland Kitchen.. 1 Tab Once Daily 7)  Metaxalone 800 Mg Tabs (Metaxalone) .... Take One By Mouth Three Times A Day 8)  Tylenol Extra Strength 500 Mg Tabs (Acetaminophen) .... As Needed 9)  Prednisone 10 Mg Tabs (Prednisone) .... 4 Tabs First Thing in The Am For 3 Days, Then Decrease By One Tab Every Three Days, Finally Taking 1/2 Tab For 6 Days. 10)  Hyzaar 100-25 Mg Tabs (Losartan Potassium-Hctz) .... Take 1  By Mouth Daily  Allergies: 1)  ! Penicillin V Potassium 2)  ! 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) POSTMENOPAUSAL ON HORMONE REPLACEMENT THERAPY (ICD-V07.4) ALLERGY (ICD-995.3) GRIEF  REACTION (ICD-309.0) NEED PROPHYLACTIC VACCINATION&INOCULATION FLU (ICD-V04.81)    Past Surgical History: Last updated: 09/11/2009 NSVD X 2 T AND A 1970'S TAH BSO  DYSMENNORHEA  1988 H/O BELL'S PALSY  15 YOA 20YOA 03/1995 BLADDER TACK 11/1995 ABD U/S NML 03/1996 ECHO NML 9/97  ETT POS 08/1997 CATH NML 10/1997 FLEX SIG HEMMS O/W NML 12/1997 SEPTOPLASTY W/ ANTRAL WINDOW  ([facility])  08/1998 BREAST  CYST ASPIRATION  (DR YOUNG)  12/1998 BREAST BX (YOUNG) NML 05/20/2000 SLEEP STUDY DECR SLEEP TIME, DECR REM SLEEP, DESAT 86%  07/2000 ETT CARDIOLITE NML EF 75% 12/26/2000 ECHO MILD LVH,LAH,MR, TR EF 50-55%   12/26/2000 ETT CARDIOLITE NML EF 70%  09/2001 HOSP R/O'D MI  10/06/2001 PARATHYROID SCAN  NML  03/15/2002 ARTHROSCOPY R KNEE  12/12/2002 CT HEAD NML  02/10/2003 HOSP SYNCOPE W/ BILAT ELBOW FX'S, TIB PLATEAU FX R KNEE  02/10/2003 CATH NML  CAROTID U/S MILD BILAT DZ  02/12/2003 HOSP  BILAT BASILAR PE'S   8/6-03/04/2003 CATTARACT REMOVALS  R 09/24/2002   L 05/28/2003 DIABETIC MAINT REFERRAL 09/2004 TOE EXCISION, L FIFTH TOE 12/2004 ETT MYOVIEW  01/10/2006  05/2005 HOSP  SYNCOPE, UNK ETIOL  ORTHOSTASIS  HYPOKALEMIA   2/14-2/16/2011 CT HEAD W/O  ATROPHY AND CHRONIC SM VESS DZ 09/08/2009 CT C-SPINE 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 09/08/2009 CT ANGIOGRAM CHEST  NO PE 09/08/2009 MRI C-SPINE  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 Never Smoked Alcohol use-no Drug use-no  Risk Factors: Alcohol Use: 0 (09/29/2009) Caffeine Use: 0 (09/29/2009) Exercise: yes (09/29/2009)  Risk Factors: Smoking Status: never (09/29/2009) Passive Smoke Exposure: no (09/29/2009)  Family History: Father dec 63 MI COPD ( Smoker) TB Mother dec 70 Natural Causes Sister A 64 DM Probs w/ leg due Brown Recluse bite, Vision loss...legally blind  Review of Systems General:   Denies chills, fatigue, fever, sweats, weakness, and weight loss. Eyes:  Denies blurring, discharge, eye pain, and itching. ENT:  Denies decreased hearing, earache, nasal congestion, ringing in ears, and sore throat. CV:  Complains of fainting and swelling of feet; denies chest pain or discomfort, fatigue, palpitations, and shortness of breath with exertion; rare swelling, unsure of fainting.Marland Kitchen Resp:  Denies cough, shortness of breath, and wheezing. GI:  Complains of constipation; denies abdominal pain, bloody stools, change in bowel habits, dark tarry stools, diarrhea, indigestion, loss of appetite, nausea, vomiting, vomiting blood, and yellowish skin color. GU:  Complains of nocturia; denies decreased libido, discharge, and urinary frequency; regularly once a night.. MS:  Denies joint pain, joint swelling, low back pain, muscle, cramps, and stiffness; continued right arm discomfort with known disc at C7.Marland Kitchen Derm:  Denies dryness, itching, and rash. Neuro:  Denies memory loss, numbness, tingling, and tremors.  Physical Exam  General:  Well-developed,well-nourished,in no acute distress; alert,appropriate and cooperative throughout examination, minimally overweight. Head:  Normocephalic and atraumatic without obvious abnormalities. No apparent alopecia or balding. Sinuses NT. Eyes:  Conjunctiva clear  bilaterally.  Ears:  External ear exam shows no significant lesions or deformities.  Otoscopic examination reveals clear canals, tympanic membranes are intact bilaterally without bulging, retraction, inflammation or discharge. Hearing is grossly normal bilaterally. Nose:  External nasal examination shows no deformity or inflammation. Nasal mucosa are pink and moist without lesions or exudates. Mouth:  Oral mucosa and oropharynx without lesions or exudates.  Teeth in good repair. Neck:  No deformities, masses, or tenderness noted. Chest Wall:  No deformities, masses, or tenderness noted. Breasts:  No  mass, nodules, thickening, tenderness, bulging, retraction, inflamation, nipple discharge or skin changes noted.  Inverted niples bilat, old finding. Lungs:  Normal respiratory effort, chest expands symmetrically. Lungs are clear to auscultation, no crackles or wheezes. Heart:  Normal rate and regular rhythm. S1 and S2 normal without gallop, murmur, click, rub or other extra sounds. Abdomen:  Bowel sounds positive,abdomen soft and non-tender without masses, organomegaly or hernias noted. Rectal:  No external abnormalities noted. Normal sphincter tone. No rectal masses or tenderness. Gneg. Genitalia:  Bimanual only done Introitus wnl, Uterus and Cervix absent, Adnexa nontender w/o mass, ovaries not felt.  Msk:  No deformity or scoliosis noted of thoracic or lumbar spine.   Pulses:  R and L carotid,radial,femoral,dorsalis pedis and posterior tibial pulses are full and equal bilaterally Extremities:  No clubbing, cyanosis, edema, or deformity noted with normal full range of motion of all joints.   Neurologic:  No cranial nerve deficits noted. Station and gait are normal. Sensory, motor and coordinative functions appear intact. Skin:  Intact without suspicious lesions or rashes Cervical Nodes:  No lymphadenopathy noted Axillary Nodes:  No palpable lymphadenopathy Inguinal Nodes:  No significant adenopathy Psych:  Cognition and judgment appear intact. Alert and cooperative with normal attention span and concentration. No apparent delusions, illusions, hallucinations, good affect.   Impression & Recommendations:  Problem # 1:  HYPOKALEMIA (ICD-276.8) Assessment Improved Resolved.  Problem # 2:  ARM PAIN, RIGHT (ICD-729.5) Assessment: Improved Better and assume from C7 area and hope steroids will resolve the acute problem.  Problem # 3:  Hx of METABOLIC SYNDROME X (ICD-277.7) Assessment: Unchanged Stable.  Problem # 4:  DEEP VENOUS THROMBOPHLEBITIS, HX OF BILAT (ICD-V12.52) Assessment:  Unchanged Stable. Her updated medication list for this problem includes:    Aspirin 81 Mg Tbec (Aspirin) .Marland Kitchen... Take one tablet by mouth daily  Problem # 5:  HYPERTENSION, BENIGN ESSENTIAL (ICD-401.1) Assessment: Unchanged Stable. Cont curr meds. Her updated medication list for this problem includes:    Hyzaar 100-25 Mg Tabs (Losartan potassium-hctz) .Marland Kitchen... Take 1  by mouth daily    Losartan Potassium-hctz 100-25 Mg Tabs (Losartan potassium-hctz) .Marland Kitchen... Take one by mouth daily  BP today: 130/78 Prior BP: 140/90 (09/25/2009)  Labs Reviewed: K+: 4.5 (09/23/2009) Creat: : 0.9 (09/23/2009)   Chol: 167 (09/23/2009)   HDL: 86.50 (09/23/2009)   LDL: 59 (09/23/2009)   TG: 107.0 (09/23/2009)  Problem # 6:  OBESITY, MORBID (ICD-278.01) Assessment: Unchanged  Stable on curr diet after lap band. Encouraged to get back to regular exercise.  Ht: 62 (09/05/2009)   Wt: 152.50 (09/29/2009)   BMI: 28.09 (09/05/2009)  Problem # 7:  HYPERLIPIDEMIA, MIXED (ICD-272.2) Assessment: Unchanged Great nos. Cont curr regimen. Her updated medication list for this problem includes:    Simvastatin 80 Mg Tabs (Simvastatin) ..... One tab by mouth at night  Labs Reviewed: SGOT: 13 (09/23/2009)   SGPT: 15 (09/23/2009)   HDL:86.50 (09/23/2009), 65.00 (09/01/2009)  LDL:59 (09/23/2009), 63 (09/01/2009)  Chol:167 (  09/23/2009), 149 (09/01/2009)  Trig:107.0 (09/23/2009), 104.0 (09/01/2009)  Problem # 8:  DISORDER, DEPRESSIVE NEC (ICD-311) Assessment: Unchanged Stable. Her updated medication list for this problem includes:    Wellbutrin Xl 300 Mg Tb24 (Bupropion hcl) .Marland Kitchen... Take one by mouth every am    Alprazolam 0.5 Mg Tabs (Alprazolam) .Marland Kitchen... 1 two times a day  Problem # 9:  AODM (ICD-250.00) Assessment: Improved  Normalized FBS and A1C. Great job, cont. Her updated medication list for this problem includes:    Aspirin 81 Mg Tbec (Aspirin) .Marland Kitchen... Take one tablet by mouth daily    Hyzaar 100-25 Mg Tabs (Losartan  potassium-hctz) .Marland Kitchen... Take 1  by mouth daily    Losartan Potassium-hctz 100-25 Mg Tabs (Losartan potassium-hctz) .Marland Kitchen... Take one by mouth daily  Labs Reviewed: Creat: 0.9 (09/23/2009)   Microalbumin: 8.9 (07/26/2006) Reviewed HgBA1c results: 5.7 (09/23/2009)  5.7 (09/01/2009)  Complete Medication List: 1)  Wellbutrin Xl 300 Mg Tb24 (Bupropion hcl) .... Take one by mouth every am 2)  Alprazolam 0.5 Mg Tabs (Alprazolam) .Marland Kitchen.. 1 two times a day 3)  Simvastatin 80 Mg Tabs (Simvastatin) .... One tab by mouth at night 4)  Centrum Silver Tabs (Multiple vitamins-minerals) .Marland Kitchen.. 1 daily by mouth 5)  Aspirin 81 Mg Tbec (Aspirin) .... Take one tablet by mouth daily 6)  Vitamin B-12 1000 Mcg Tabs (Cyanocobalamin) .Marland Kitchen.. 1 tab once daily 7)  Tylenol Extra Strength 500 Mg Tabs (Acetaminophen) .... As needed 8)  Prednisone 10 Mg Tabs (Prednisone) .... 4 tabs first thing in the am for 3 days, then decrease by one tab every three days, finally taking 1/2 tab for 6 days. 9)  Hyzaar 100-25 Mg Tabs (Losartan potassium-hctz) .... Take 1  by mouth daily 10)  Losartan Potassium-hctz 100-25 Mg Tabs (Losartan potassium-hctz) .... Take one by mouth daily  PAP Screening:    Last PAP smear:  08/29/2008  Mammogram Screening:    Last Mammogram:  01/06/2009  Osteoporosis Risk Assessment:  Risk Factors for Fracture or Low Bone Density:   Race (White or Asian):     yes   Smoking status:       never  Immunization & Chemoprophylaxis:    Tetanus vaccine: Tdap  (08/29/2008)  Patient Instructions: 1)  Try Citrucel 1/2 - 1 tsp every AM in 8 oz water. 2)  RTC in the Fall.  Current Allergies (reviewed today): ! PENICILLIN V POTASSIUM ! CODEINE SULFATE

## 2010-08-27 NOTE — Assessment & Plan Note (Signed)
Summary: cpx, transfer from [facility]   Vital Signs:  Patient profile:   65 year old female Height:      62 inches Weight:      155 pounds BMI:     28.45 Temp:     97.9 degrees F oral Pulse rate:   80 / minute Pulse rhythm:   regular BP sitting:   130 / 80  (right arm) Cuff size:   regular  Vitals Entered By: Linde Gillis CMA Duncan Dull) (April 16, 2010 8:09 AM) CC: complete physical, transfer from Dr. Hetty Ely   History of Present Illness: 65 yo new to me here for CPX.  1.  Depression- stopped taking the Wellbutrin and has not noticed any difference at all  Does not feel as depressed as she did when her husband died.  Still gets anxious at times and Alprazolam helps.  At times ,she is more anxious than other times and feels she needs something a little stronger.  No pain attacks, no anhedonia, no SI or HI.  2.  Hyperlipidemia- has been well controlled on Simvastatin 80 mg daily.  No h/o myalgias on this high dose.  Reviewed labs with pt today.  3. HTN- stable on Losartan HCTZ 100-25 mg daily.  No CP, blurred vision, SOB or LE edema.    4. fatigue- she and her daughter have noticed that she is more fatigued lately.  No DOE or SOB in general.  No CP.  No dizziness when she stands up although frequently has vertigo.  Sometimes has tingling in her fingers and toes.  5.  Well woman- refusing colonoscopy due to her prior lap band procedure (vomits when drinks a lot of fluid), due for pneumovax, flu shot, bone density.    Current Medications (verified): 1)  Alprazolam 1 Mg  Tabs (Alprazolam) .Marland Kitchen.. 1 Tab By Mouth Three Times A Day As Needed Anxiety 2)  Simvastatin 80 Mg  Tabs (Simvastatin) .... One Tab By Mouth At Night 3)  Centrum Silver  Tabs (Multiple Vitamins-Minerals) .Marland Kitchen.. 1 Daily By Mouth 4)  Aspirin 81 Mg Tbec (Aspirin) .... Take One Tablet By Mouth Daily 5)  Vitamin B-12 1000 Mcg Tabs (Cyanocobalamin) .Marland Kitchen.. 1 Tab Once Daily 6)  Losartan Potassium-Hctz 100-25 Mg Tabs (Losartan  Potassium-Hctz) .... Take One By Mouth Daily 7)  Meclizine Hcl 25 Mg Tabs (Meclizine Hcl) .... 1/2 To 1 Tab By Mouth Q 6 Hours As Needed Dizziness / Vertigo 8)  Zyrtec Allergy 10 Mg Tabs (Cetirizine Hcl) .Marland Kitchen.. 1 Tab At Bedtime  Allergies: 1)  ! Penicillin V Potassium 2)  ! 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) POSTMENOPAUSAL ON HORMONE REPLACEMENT THERAPY (ICD-V07.4) ALLERGY (ICD-995.3) GRIEF REACTION (ICD-309.0) NEED PROPHYLACTIC VACCINATION&INOCULATION FLU (ICD-V04.81)    Past Surgical History: Last updated: 09/11/2009 NSVD X 2 T AND A 1970'S TAH BSO  DYSMENNORHEA  1988 H/O BELL'S PALSY  15 YOA 20YOA 03/1995 BLADDER TACK 11/1995 ABD U/S NML 03/1996 ECHO NML 9/97  ETT POS 08/1997 CATH NML 10/1997 FLEX SIG HEMMS O/W NML 12/1997 SEPTOPLASTY W/ ANTRAL WINDOW  ([facility])  08/1998 BREAST CYST ASPIRATION  (DR YOUNG)  12/1998 BREAST BX (YOUNG) NML 05/20/2000 SLEEP STUDY  DECR SLEEP TIME, DECR REM SLEEP, DESAT 86%  07/2000 ETT CARDIOLITE NML EF 75% 12/26/2000 ECHO MILD LVH,LAH,MR, TR EF 50-55%   12/26/2000 ETT CARDIOLITE NML EF 70%  09/2001 HOSP R/O'D MI  10/06/2001 PARATHYROID SCAN  NML  03/15/2002 ARTHROSCOPY R KNEE  12/12/2002 CT HEAD NML  02/10/2003 HOSP SYNCOPE W/ BILAT ELBOW FX'S, TIB PLATEAU FX R KNEE  02/10/2003 CATH NML  CAROTID U/S MILD BILAT DZ  02/12/2003 HOSP  BILAT BASILAR PE'S   8/6-03/04/2003 CATTARACT REMOVALS  R 09/24/2002   L 05/28/2003 DIABETIC MAINT  REFERRAL 09/2004 TOE EXCISION, L FIFTH TOE 12/2004 ETT MYOVIEW  01/10/2006  05/2005 HOSP  SYNCOPE, UNK ETIOL  ORTHOSTASIS  HYPOKALEMIA   2/14-2/16/2011 CT HEAD W/O  ATROPHY AND CHRONIC SM VESS DZ 09/08/2009 CT C-SPINE NO ACUTE  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 09/08/2009 CT ANGIOGRAM CHEST  NO PE 09/08/2009 MRI C-SPINE  NEW FOCAL SOFT DISK HERNIATION C6/7 AFFECTING C7 ROOT SM EFFECT C5 ROOT 09/08/2009 MRI BRAIN NO INFARCT  PROM SM VESS DZ  ATROPHY 09/08/2009 ECHO MILD LVH EF 65%  MILDLY CALCIF MV  NML AV  09/09/2009 LE VENOUS DOPPLERS NML 09/08/2009 CAROTID U/S  NML 09/10/2009  Family History: Last updated: 09/29/2009 Father dec 63 MI COPD ( Smoker) TB Mother dec 70 Natural Causes Sister A 72 DM Probs w/ leg due Brown Recluse bite, Vision loss...legally blind  Social History: Last updated: 08/29/2008 Occupation: [employer] Married/ Widowed  03/11/2007  2 children  Son DM and obese. Never Smoked Alcohol use-no Drug use-no  Risk Factors: Alcohol Use: 0 (09/29/2009) Caffeine Use: 0 (09/29/2009) Exercise: yes (09/29/2009)  Risk Factors: Smoking Status: never (09/29/2009) Passive Smoke Exposure: no (09/29/2009)  Review of Systems      See HPI General:  Complains of fatigue. Eyes:  Denies blurring. ENT:  Denies difficulty swallowing. CV:  Denies chest pain or discomfort. Resp:  Denies shortness of breath. GI:  Denies abdominal pain, bloody stools, and change in bowel habits. GU:  Denies abnormal vaginal bleeding, discharge, and dysuria. MS:  Denies joint pain, joint redness, and joint swelling. Derm:  Denies rash. Neuro:  Denies headaches. Psych:  Complains of anxiety; denies depression, mental problems, panic attacks, sense of great danger, suicidal thoughts/plans, thoughts of violence, unusual visions or sounds, and thoughts /plans of harming others. Endo:  Denies cold intolerance and heat intolerance. Heme:  Denies abnormal bruising and bleeding.  Physical  Exam  General:  Well-developed,well-nourished,in no acute distress; alert,appropriate and cooperative throughout examination, minimally overweight. Head:  Normocephalic and atraumatic without obvious abnormalities. No apparent alopecia or balding.  Eyes:  Conjunctiva clear bilaterally.  Ears:  no external deformities.     Nose:  no external deformity.   Mouth:  Oral mucosa and oropharynx without lesions or exudates.  Teeth in good repair. Lungs:  Normal respiratory effort, chest expands symmetrically. Lungs are clear to auscultation, no crackles or wheezes. Heart:  Normal rate and regular rhythm. S1 and S2 normal without gallop, murmur, click, rub or other extra sounds. Abdomen:  Bowel sounds positive,abdomen soft and non-tender without masses, organomegaly or hernias noted. Msk:  No deformity or scoliosis noted of thoracic or lumbar spine.   Extremities:  No clubbing, cyanosis, edema, or deformity noted with normal full range of motion of all joints.   Neurologic:  No cranial nerve deficits noted. Station and gait are normal. Sensory, motor and coordinative functions appear intact. Skin:  Intact without suspicious lesions or rashes Psych:  Cognition and judgment appear  intact. Alert and cooperative with normal attention span and concentration. No apparent delusions, illusions, hallucinations   Impression & Recommendations:  Problem # 1:  ANXIETY DEPRESSION (ICD-300.4) Assessment Deteriorated Although depression improved, anxiety seems to have worsened. Ok to stop the Wellbutrin.  Will increase dose of Alprazolam for now to see if it helps. Pt to let me know how she is doing in 1 month.  Problem # 2:  HYPERTENSION, BENIGN ESSENTIAL (ICD-401.1) Assessment: Unchanged Stable on current meds.  Discussed BMET results. Her updated medication list for this problem includes:    Losartan Potassium-hctz 100-25 Mg Tabs (Losartan potassium-hctz) .Marland Kitchen... Take one by mouth daily  Problem # 3:   HYPERLIPIDEMIA, MIXED (ICD-272.2) Assessment: Unchanged Stable on Simvastatin 80 mg daily, discussed lipid panel results. Her updated medication list for this problem includes:    Simvastatin 80 Mg Tabs (Simvastatin) ..... One tab by mouth at night  Problem # 4:  AODM (ICD-250.00) Assessment: Unchanged Stable, diet controlled. Her updated medication list for this problem includes:    Aspirin 81 Mg Tbec (Aspirin) .Marland Kitchen... Take one tablet by mouth daily    Losartan Potassium-hctz 100-25 Mg Tabs (Losartan potassium-hctz) .Marland Kitchen... Take one by mouth daily  Problem # 5:  FATIGUE (ICD-780.79) Assessment: New Likely due to lifestyle changes but will check CBC, TSH, B12/folate. Orders: Venipuncture (78295) TLB-CBC Platelet - w/Differential (85025-CBCD) TLB-TSH (Thyroid Stimulating Hormone) (84443-TSH) TLB-B12 + Folate Pnl (62130_86578-I69/GEX)  Complete Medication List: 1)  Alprazolam 1 Mg Tabs (Alprazolam) .Marland Kitchen.. 1 tab by mouth three times a day as needed anxiety 2)  Simvastatin 80 Mg Tabs (Simvastatin) .... One tab by mouth at night 3)  Centrum Silver Tabs (Multiple vitamins-minerals) .Marland Kitchen.. 1 daily by mouth 4)  Aspirin 81 Mg Tbec (Aspirin) .... Take one tablet by mouth daily 5)  Vitamin B-12 1000 Mcg Tabs (Cyanocobalamin) .Marland Kitchen.. 1 tab once daily 6)  Losartan Potassium-hctz 100-25 Mg Tabs (Losartan potassium-hctz) .... Take one by mouth daily 7)  Meclizine Hcl 25 Mg Tabs (Meclizine hcl) .... 1/2 to 1 tab by mouth q 6 hours as needed dizziness / vertigo 8)  Zyrtec Allergy 10 Mg Tabs (Cetirizine hcl) .Marland Kitchen.. 1 tab at bedtime  Other Orders: Flu Vaccine 27yrs + (52841) Admin 1st Vaccine (32440) Pneumococcal Vaccine (10272) Admin of Any Addtl Vaccine (53664) Radiology Referral (Radiology)  Patient Instructions: 1)  Great to meet you. 2)  Please stop by to see Aram Beecham on your way out. Prescriptions: ALPRAZOLAM 1 MG  TABS (ALPRAZOLAM) 1 tab by mouth three times a day as needed anxiety  #60 x 0    Entered and Authorized by:   [name] MD   Signed by:   [name] MD on 04/16/2010   Method used:   Print then Give to Patient   RxID:   [id number]   Current Allergies (reviewed today): ! PENICILLIN V POTASSIUM ! CODEINE SULFATE   Immunizations Administered:  Influenza Vaccine # 1:    Vaccine Type: Fluvax 3+    Site: left deltoid    Mfr: GlaxoSmithKline    Dose: 0.5 ml    Route: IM    Given by: Linde Gillis CMA (AAMA)    Exp. Date: 01/23/2011    Lot #: [id number]    VIS given: 02/17/10 version given April 16, 2010.  Pneumonia Vaccine:    Vaccine Type: Pneumovax    Site: left deltoid    Mfr: Merck    Dose: 0.5 ml    Route: IM    Given by: Lowella Bandy  Thorpe CMA (AAMA)    Exp. Date: 10/08/2011    Lot #: [id number]    VIS given: 06/30/09 version given April 16, 2010.  Flu Vaccine Result Date:  04/16/2010 Herpes Zoster Result Date:  04/18/2008 Herpes Zoster Result:  historical Last PAP:  Normal, Satisfactory (08/29/2008 2:25:36 PM) PAP Next Due:  2 yr    Prevention & Chronic Care Immunizations   Influenza vaccine: Fluvax 3+  (04/16/2010)    Tetanus booster: 08/29/2008: Tdap   Tetanus booster due: 08/29/2018    Pneumococcal vaccine: Pneumovax  (04/16/2010)    H. zoster vaccine: 04/18/2008: historical  Colorectal Screening   Hemoccult: negative  (10/08/2009)   Hemoccult due: 10/09/2010    Colonoscopy: Not documented   Colonoscopy action/deferral: Refused  (04/16/2010)  Other Screening   Pap smear: Normal, Satisfactory  (08/29/2008)   Pap smear action/deferral: Deferred-2 yr interval  (04/16/2010)   Pap smear due: 08/29/2010    Mammogram: Normal Bilateral  (01/13/2010)   Mammogram due: 01/2011    DXA bone density scan: Not documented   DXA bone density action/deferral: Ordered  (04/16/2010)   Smoking status: never  (09/29/2009)  Diabetes Mellitus   HgbA1C: 5.7  (04/14/2010)   Hemoglobin A1C due: 07/14/2010    Eye exam: Not documented     Foot exam: yes  (09/18/2007)   High risk foot: Not documented   Foot care education: Not documented   Foot exam due: 09/17/2008    Urine microalbumin/creatinine ratio: 5.5  (09/23/2009)   Urine microalbumin/cr due: 09/24/2010    Diabetes flowsheet reviewed?: Yes   Progress toward A1C goal: At goal  Lipids   Total Cholesterol: 154  (04/14/2010)   LDL: 71  (04/14/2010)   LDL Direct: Not documented   HDL: 62.70  (04/14/2010)   Triglycerides: 103.0  (04/14/2010)    SGOT (AST): 13  (09/23/2009)   SGPT (ALT): 15  (09/23/2009)   Alkaline phosphatase: 46  (09/23/2009)   Total bilirubin: 0.7  (09/23/2009)  Hypertension   Last Blood Pressure: 130 / 80  (04/16/2010)   Serum creatinine: 0.8  (04/14/2010)   Serum potassium 4.4  (04/14/2010)  Self-Management Support :    Diabetes self-management support: Not documented    Hypertension self-management support: Not documented    Lipid self-management support: Not documented    Nursing Instructions: Give Pneumovax today Schedule screening DXA bone density scan (see order)

## 2010-08-27 NOTE — Assessment & Plan Note (Signed)
Summary: LIPIDS/ANAS  Medications Added LOSARTAN POTASSIUM-HCTZ 100-25 MG TABS (LOSARTAN POTASSIUM-HCTZ) 1 tab once daily ASPIRIN 81 MG TBEC (ASPIRIN) Take one tablet by mouth daily VITAMIN B-12 1000 MCG TABS (CYANOCOBALAMIN) 1 tab once daily        Visit Type:  rov Primary Provider:  Shaune Leeks MD  CC:  edema/feet.Marland Kitchenotherwise no other complaints today.  History of Present Illness: [name] returns today for followup of her multiple cardiac risk factors including obesity, type 2 diabetes, hypertension, hyperlipidemia.  Since she had her bariatric surgery, she's remained in a much better category having lost a total of 67 pounds. She has gained a few Bactrim last year. However, her blood pressure is under good control, her hemoglobin A1c was 5.9% in January of this year, and her lipid panel was remarkably good at goal.  She denies orthopnea, PND, dyspnea on exertion, syncope, or angina. She is very compliant with her medications.  Current Medications (verified): 1)  Metformin Hcl 500 Mg Tabs (Metformin Hcl) .... Take 1 Tablet By Mouth in The Evening 2)  Wellbutrin Xl 300 Mg  Tb24 (Bupropion Hcl) .Marland Kitchen.. 1 Q Am 3)  Accu-Chek Compact Test Drum   Strp (Glucose Blood) .... Test Blood Twice Daily 4)  Alprazolam 0.5 Mg  Tabs (Alprazolam) .Marland Kitchen.. 1 Two Times A Day 5)  Losartan Potassium-Hctz 100-25 Mg Tabs (Losartan Potassium-Hctz) .Marland Kitchen.. 1 Tab Once Daily 6)  Simvastatin 80 Mg  Tabs (Simvastatin) .... One Tab By Mouth At Night 7)  Accu-Chek Softclix Lancets  Misc (Lancets) .... Use Daily As Directed  250.00 Icd-9 Code 8)  Centrum Silver  Tabs (Multiple Vitamins-Minerals) .Marland Kitchen.. 1 Daily By Mouth 9)  Aspirin 81 Mg Tbec (Aspirin) .... Take One Tablet By Mouth Daily 10)  Vitamin B-12 1000 Mcg Tabs (Cyanocobalamin) .Marland Kitchen.. 1 Tab Once Daily  Allergies: 1)  ! Penicillin V Potassium 2)  ! 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) POSTMENOPAUSAL ON HORMONE REPLACEMENT THERAPY (ICD-V07.4) ALLERGY (ICD-995.3) GRIEF REACTION (ICD-309.0) NEED PROPHYLACTIC VACCINATION&INOCULATION FLU (ICD-V04.81)    Past Surgical History: Last updated: 08/29/2008 NSVD X 2 T AND A 1970'S TAH BSO  DYSMENNORHEA  1988 H/O BELL'S PALSY  15 YOA 20YOA 03/1995 BLADDER TACK 11/1995 ABD U/S NML 03/1996 ECHO NML 9/97  ETT POS 08/1997 CATH NML 10/1997 FLEX SIG HEMMS O/W NML 12/1997 SEPTOPLASTY W/ ANTRAL WINDOW  ([facility])  08/1998 BREAST CYST ASPIRATION  (DR YOUNG)  12/1998 BREAST BX (YOUNG) NML 05/20/2000 SLEEP STUDY DECR SLEEP TIME, DECR REM SLEEP, DESAT 86%  07/2000 ETT CARDIOLITE NML EF 75% 12/26/2000 ECHO MILD LVH,LAH,MR, TR EF 50-55%   12/26/2000 ETT CARDIOLITE NML EF 70%  09/2001 HOSP R/O'D MI  10/06/2001 PARATHYROID SCAN  NML  03/15/2002 ARTHROSCOPY R KNEE  12/12/2002 CT HEAD NML  02/10/2003 HOSP SYNCOPE W/ BILAT ELBOW FX'S, TIB PLATEAU FX R KNEE  02/10/2003 CATH NML  CAROTID U/S MILD BILAT DZ  02/12/2003 HOSP  BILAT BASILAR PE'S   8/6-03/04/2003 CATTARACT REMOVALS  R 09/24/2002   L 05/28/2003 DIABETIC MAINT REFERRAL 09/2004 TOE EXCISION, L FIFTH TOE 12/2004 ETT MYOVIEW  01/10/2006  05/2005  Family History: Last updated: 08/29/2008 Father dec 63  MI COPD ( Smoker) TB Mother dec 70 Natural Causes Sister A 45 DM Probs w/ leg due Brown Recluse bite, Vision loss...legally blind  Social History: Last updated: 08/29/2008 Occupation: [employer] Married/ Widowed  03/11/2007  2 children  Son  DM and obese. Never Smoked Alcohol use-no Drug use-no  Risk Factors: Caffeine Use: 0 (08/22/2007) Exercise: yes (08/22/2007)  Risk Factors: Smoking Status: never (11/16/2006) Passive Smoke Exposure: no (08/22/2007)  Review of Systems       negative other than the history of present illness  Vital Signs:  Patient profile:   65 year old female Height:      62 inches Weight:      161 pounds BMI:     29.55 Pulse rate:   78 / minute Pulse rhythm:   regular BP sitting:   122 / 78  (left arm) Cuff size:   large  Vitals Entered By: [name], CMA (February 25, 2009 2:27 PM)  Physical Exam  General:  Well developed, well nourished, in no acute distress. Head:  left facial paralysis secondary to Bell's palsy Eyes:  PERRLA/EOM intact; conjunctiva and lids normal. Mouth:  Teeth, gums and palate normal. Oral mucosa normal. Neck:  Neck supple, no JVD. No masses, thyromegaly or abnormal cervical nodes. Chest [name]:  no deformities or breast masses noted Lungs:  Clear bilaterally to auscultation and percussion. Heart:  Non-displaced PMI, chest non-tender; regular rate and rhythm, S1, S2 without murmurs, rubs or gallops. Carotid upstroke normal, no bruit. Normal abdominal aortic size, no bruits. Femorals normal pulses, no bruits. Pedals normal pulses. No edema, no varicosities. Abdomen:  Bowel sounds positive; abdomen soft and non-tender without masses, organomegaly, or hernias noted. No hepatosplenomegaly. Msk:  decreased ROM.  decreased ROM.   Pulses:  pulses normal in all 4 extremities Extremities:  No clubbing or cyanosis. Neurologic:  Alert and oriented x 3. Skin:  Intact without lesions or rashes. Psych:  Normal affect.   EKG  Procedure date:  02/25/2009  Findings:      normal sinus rhythm, incomplete right bundle which has not changed, left axis deviation, stable  Impression & Recommendations:  Problem # 1:  HYPERTENSION, BENIGN ESSENTIAL (ICD-401.1) Assessment  Improved  Her updated medication list for this problem includes:    Losartan Potassium-hctz 100-25 Mg Tabs (Losartan potassium-hctz) .Marland Kitchen... 1 tab once daily    Aspirin 81 Mg Tbec (Aspirin) .Marland Kitchen... Take one tablet by mouth daily  Her updated medication list for this problem includes:    Losartan Potassium-hctz 100-25 Mg Tabs (Losartan potassium-hctz) .Marland Kitchen... 1 tab once daily    Aspirin 81 Mg Tbec (Aspirin) .Marland Kitchen... Take one tablet by mouth daily  Problem # 2:  OBESITY, MORBID (ICD-278.01) Assessment: Improved  Problem # 3:  HYPERLIPIDEMIA, MIXED (ICD-272.2) Assessment: Improved  Her updated medication list for this problem includes:    Simvastatin 80 Mg Tabs (Simvastatin) ..... One tab by mouth at night  Her updated medication list for this problem includes:    Simvastatin 80 Mg Tabs (Simvastatin) ..... One tab by mouth at night  Problem # 4:  AODM (ICD-250.00) Assessment: Improved  Her updated medication list for this problem includes:    Metformin Hcl 500 Mg Tabs (Metformin hcl) .Marland Kitchen... Take 1 tablet by mouth in the evening    Losartan Potassium-hctz 100-25 Mg Tabs (Losartan potassium-hctz) .Marland Kitchen... 1 tab once daily    Aspirin 81 Mg Tbec (Aspirin) .Marland Kitchen... Take one tablet by mouth daily  Her updated medication list for this  problem includes:    Metformin Hcl 500 Mg Tabs (Metformin hcl) .Marland Kitchen... Take 1 tablet by mouth in the evening    Losartan Potassium-hctz 100-25 Mg Tabs (Losartan potassium-hctz) .Marland Kitchen... 1 tab once daily    Aspirin 81 Mg Tbec (Aspirin) .Marland Kitchen... Take one tablet by mouth daily

## 2010-08-27 NOTE — Assessment & Plan Note (Signed)
Summary: DISCUSS MEDICATION/CLE  Medications Added WELLBUTRIN SR 150 MG TB12 (BUPROPION HCL) Take 1 tablet by mouth DAILY ACTOS 30 MG  TABS (PIOGLITAZONE HCL) 1 QD        Vital Signs:  Patient Profile:   65 Years Old Female Weight:      207 pounds Temp:     99.4 degrees F oral Pulse rate:   88 / minute BP sitting:   130 / 70  (right arm) Cuff size:   large               Chief Complaint:  DISCUSS MEDICATIONS.  History of Present Illness: Is having Lapband Surgery on Monday and Dr Johna Sheriff has asked for large pills to be changed to smaller ones t allow for passage through the new orifice. She has already been to the pharmacy and switched Weelbutrin to a smaller generic pill. She is on Allegra which is rather large and should be able to change to claritin, a very small pill. She is on Flomax, a medium sized capsule, which I feel she can try to do without (was on it to help urination) Glucophage and Caduet I think both can be cut into pieces, as can Buspirone and Hyzaar. Dr Daleen Squibb switched Avandia to Actos which is a smaller pill.  Current Allergies (reviewed today): ! PENICILLIN V POTASSIUM (PENICILLIN V POTASSIUM TABS) ! CODEINE SULFATE (CODEINE SULFATE TABS)      Physical Exam  General:     Well-developed,well-nourished,in no acute distress; alert,appropriate and cooperative throughout examination Head:     Normocephalic and atraumatic without obvious abnormalities. No apparent alopecia or balding. Eyes:     Conjunctiva clear bilaterally.  Ears:     External ear exam shows no significant lesions or deformities.  Otoscopic examination reveals clear canals, tympanic membranes are intact bilaterally without bulging, retraction, inflammation or discharge. Hearing is grossly normal bilaterally. Nose:     External nasal examination shows no deformity or inflammation. Nasal mucosa are pink and moist without lesions or exudates. Mouth:     Oral mucosa and oropharynx without  lesions or exudates.  Teeth in good repair.    Impression & Recommendations:  Problem # 1:  ALLERGY (ICD-995.3) Assessment: Unchanged Change to claritin for smaller size pill.  Problem # 2:  AODM (ICD-250.00) Assessment: Improved historically excellent control...will probably be able to decrease meds in future with weight loss. The following medications were removed from the medication list:    Glyburide 2.5 Mg Tabs (Glyburide) .Marland Kitchen... Take 1 tablet by mouth every morning    Avandia 8 Mg Tabs (Rosiglitazone maleate)  Her updated medication list for this problem includes:    Metformin Hcl 500 Mg Tabs (Metformin hcl) .Marland Kitchen... Take 1 tablet by mouth in the morning and 2 tablets by mouth in the evening    Hyzaar 100-25 Mg Tabs (Losartan potassium-hctz) .Marland Kitchen... 1 by mouth qam    Actos 30 Mg Tabs (Pioglitazone hcl) .Marland Kitchen... 1 qd   Problem # 3:  HYPERTENSION, BENIGN ESSENTIAL (ICD-401.1) Assessment: Improved  Her updated medication list for this problem includes:    Caduet 5-40 Mg Tabs (Amlodipine-atorvastatin) .Marland Kitchen... 1 by mouth daily    Hyzaar 100-25 Mg Tabs (Losartan potassium-hctz) .Marland Kitchen... 1 by mouth qam  BP today: 130/70  Labs Reviewed: Creat: 0.6 (08/11/2006) Chol: 138 (08/11/2006)   HDL: 52.4 (08/11/2006)   LDL: 67 (08/11/2006)   TG: 91 (08/11/2006)   Problem # 4:  HYPERLIPIDEMIA, MIXED (ICD-272.2) Assessment: Unchanged  Her updated medication list for  this problem includes:    Caduet 5-40 Mg Tabs (Amlodipine-atorvastatin) .Marland Kitchen... 1 by mouth daily  Labs Reviewed: Chol: 138 (08/11/2006)   HDL: 52.4 (08/11/2006)   LDL: 67 (08/11/2006)   TG: 91 (08/11/2006) SGOT: 18 (08/11/2006)   SGPT: 17 (08/11/2006)   Medications Added to Medication List This Visit: 1)  Wellbutrin Sr 150 Mg Tb12 (Bupropion hcl) .... Take 1 tablet by mouth daily 2)  Actos 30 Mg Tabs (Pioglitazone hcl) .Marland Kitchen.. 1 qd   Patient Instructions: 1)  continue meds as discussed. Check cut pills at home for homogeneity.  If  they appear homogeneous, can proobably be taken successfully w/o difficulty.

## 2010-08-27 NOTE — Assessment & Plan Note (Signed)
Summary: KNOT UNDER LEFT ARM/ SORE TO THE TOUCH CYD   Vital Signs:  Patient profile:   65 year old female Height:      62 inches Weight:      151 pounds BMI:     27.72 Temp:     97.9 degrees F oral Pulse rate:   64 / minute Pulse rhythm:   regular BP sitting:   122 / 70  (left arm) Cuff size:   regular  Vitals Entered By: Linde Gillis CMA Duncan Dull) (June 12, 2010 2:44 PM) CC: lump in breast   History of Present Illness: 65 yo here for lump in her breast.    Noticed a pea size lump in her left breast at 3 oclock, has not grown much in size but is now tender to palpation. No changes in her nipple or discharge. No fevers or chills.  No nights sweats or changes in appetite.  UTD on mammogram, last one was normal in 12/2010.  Current Medications (verified): 1)  Alprazolam 0.5 Mg  Tabs (Alprazolam) .Marland Kitchen.. 1 Tab By Mouth Two Times A Day. 2)  Simvastatin 80 Mg  Tabs (Simvastatin) .... One Tab By Mouth At Night 3)  Centrum Silver  Tabs (Multiple Vitamins-Minerals) .Marland Kitchen.. 1 Daily By Mouth 4)  Aspirin 81 Mg Tbec (Aspirin) .... Take One Tablet By Mouth Daily 5)  Vitamin B-12 1000 Mcg Tabs (Cyanocobalamin) .... Take One Tablet By Mouth Daily 6)  Losartan Potassium-Hctz 50-12.5 Mg Tabs (Losartan Potassium-Hctz) .Marland Kitchen.. 1 Tab By Mouth Daily. 7)  Meclizine Hcl 25 Mg Tabs (Meclizine Hcl) .... 1/2 To 1 Tab By Mouth Q 6 Hours As Needed Dizziness / Vertigo 8)  Zyrtec Allergy 10 Mg Tabs (Cetirizine Hcl) .Marland Kitchen.. 1 Tab At Bedtime 9)  Fluoxetine Hcl 20 Mg  Tabs (Fluoxetine Hcl) .... Take 1 Tab By Mouth Daily 10)  Caltrate 600+d 600-400 Mg-Unit Chew (Calcium Carbonate-Vitamin D) .... Chew Two Tablet By Mouth Daily 11)  Vitamin D3 1000 Unit Tabs (Cholecalciferol) .Marland Kitchen.. 1 Tab By Mouth Daily.  Allergies: 1)  ! Penicillin V Potassium 2)  ! 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) POSTMENOPAUSAL ON HORMONE REPLACEMENT THERAPY (ICD-V07.4) ALLERGY (ICD-995.3) GRIEF REACTION (ICD-309.0) NEED PROPHYLACTIC VACCINATION&INOCULATION FLU (ICD-V04.81)    Past Surgical History: Last updated: 09/11/2009 NSVD X 2 T AND A 1970'S TAH BSO  DYSMENNORHEA  1988 H/O BELL'S PALSY  15 YOA 19YOA 03/1995 BLADDER TACK 11/1995 ABD U/S NML 03/1996 ECHO NML 9/97  ETT POS 08/1997 CATH NML 10/1997 FLEX SIG HEMMS O/W NML 12/1997 SEPTOPLASTY W/ ANTRAL WINDOW  ([place])  08/1998 BREAST CYST ASPIRATION  (DR YOUNG)  12/1998 BREAST BX (YOUNG) NML 05/20/2000 SLEEP STUDY DECR SLEEP TIME, DECR REM SLEEP, DESAT 86%  07/2000 ETT CARDIOLITE NML EF 75% 12/26/2000 ECHO MILD LVH,LAH,MR, TR EF 50-55%   12/26/2000 ETT CARDIOLITE NML EF 70%  09/2001 HOSP R/O'D MI  10/06/2001 PARATHYROID SCAN  NML  03/15/2002 ARTHROSCOPY R KNEE  12/12/2002 CT HEAD NML  02/10/2003 HOSP SYNCOPE W/ BILAT ELBOW FX'S, TIB PLATEAU FX R KNEE  02/10/2003 CATH NML  CAROTID U/S MILD BILAT DZ  02/12/2003 HOSP  BILAT BASILAR PE'S   8/6-03/04/2003  CATTARACT REMOVALS  R 09/24/2002   L 05/28/2003 DIABETIC MAINT REFERRAL 09/2004 TOE EXCISION, L FIFTH TOE 12/2004 ETT MYOVIEW  01/10/2006  05/2005 HOSP  SYNCOPE, UNK ETIOL  ORTHOSTASIS  HYPOKALEMIA   2/14-2/16/2011 CT HEAD W/O  ATROPHY AND CHRONIC SM VESS DZ 09/08/2009 CT C-SPINE NO ACUTE  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 09/08/2009 CT ANGIOGRAM CHEST  NO PE 09/08/2009 MRI C-SPINE  NEW FOCAL SOFT DISK HERNIATION C6/7 AFFECTING C7 ROOT SM EFFECT C5 ROOT 09/08/2009 MRI BRAIN NO INFARCT  PROM  SM VESS DZ  ATROPHY 09/08/2009 ECHO MILD LVH EF 65%  MILDLY CALCIF MV  NML AV  09/09/2009 LE VENOUS DOPPLERS NML 09/08/2009 CAROTID U/S  NML 09/10/2009  Family History: Last updated: 09/29/2009 Father dec 63 MI COPD ( Smoker) TB Mother dec 70 Natural Causes Sister A 12 DM Probs w/ leg due [place] Recluse bite, Vision loss...legally blind  Social History: Last updated: 08/29/2008 Occupation: [employer] Married/ Widowed  03/11/2007  2 children  Son DM and obese. Never Smoked Alcohol use-no Drug use-no  Risk Factors: Alcohol Use: 0 (09/29/2009) Caffeine Use: 0 (09/29/2009) Exercise: yes (09/29/2009)  Risk Factors: Smoking Status: never (09/29/2009) Passive Smoke Exposure: no (09/29/2009)  Review of Systems      See HPI General:  Denies chills and fever. GI:  Denies abdominal pain, nausea, and vomiting.  Physical Exam  General:  Well-developed,well-nourished,in no acute distress; alert,appropriate and cooperative throughout examination, minimally overweight. Breasts:  No , thickening, tenderness, bulging, retraction, inflamation, nipple discharge or skin changes noted.   pea size palpable lump at 3 oclcock of left breast, TTP, freely movable Axillary Nodes:  No palpable lymphadenopathy Psych:  Cognition and judgment appear intact. Alert and cooperative with normal attention span and concentration. No apparent delusions, illusions, hallucinations   Impression & Recommendations:  Problem # 1:  BREAST MASS, LEFT (ICD-611.72) Assessment New Likely not malignant but needs to be evaluated further. Will send for diagnostic mammogram. Orders: Radiology Referral (Radiology)  Complete Medication List: 1)  Alprazolam 0.5 Mg Tabs (Alprazolam) .Marland Kitchen.. 1 tab by mouth two times a day. 2)  Simvastatin 80 Mg Tabs (Simvastatin) .... One tab by mouth at night 3)  Centrum Silver Tabs (Multiple vitamins-minerals) .Marland Kitchen.. 1 daily by mouth 4)  Aspirin 81 Mg Tbec (Aspirin) .... Take one tablet by  mouth daily 5)  Vitamin B-12 1000 Mcg Tabs (Cyanocobalamin) .... Take one tablet by mouth daily 6)  Losartan Potassium-hctz 50-12.5 Mg Tabs (Losartan potassium-hctz) .Marland Kitchen.. 1 tab by mouth daily. 7)  Meclizine Hcl 25 Mg Tabs (Meclizine hcl) .... 1/2 to 1 tab by mouth q 6 hours as needed dizziness / vertigo 8)  Zyrtec Allergy 10 Mg Tabs (Cetirizine hcl) .Marland Kitchen.. 1 tab at bedtime 9)  Fluoxetine Hcl 20 Mg Tabs (Fluoxetine hcl) .... Take 1 tab by mouth daily 10)  Caltrate 600+d 600-400 Mg-unit Chew (Calcium carbonate-vitamin d) .... Chew two tablet by mouth daily 11)  Vitamin D3 1000 Unit Tabs (Cholecalciferol) .Marland Kitchen.. 1 tab by mouth daily.  Patient Instructions: 1)  Please stop by to see Shirlee Limerick on  your way out to set up your mammogram. Prescriptions: LOSARTAN POTASSIUM-HCTZ 50-12.5 MG TABS (LOSARTAN POTASSIUM-HCTZ) 1 tab by mouth daily.  #90 x 3   Entered and Authorized by:   [name] MD   Signed by:   [name] MD on 06/12/2010   Method used:   Print then Give to Patient   RxID:   [id number] FLUOXETINE HCL 20 MG  TABS (FLUOXETINE  HCL) Take 1 tab by mouth daily  #90 x 3   Entered and Authorized by:   [name] MD   Signed by:   [name] MD on 06/12/2010   Method used:   Print then Give to Patient   RxID:   [id number]    Orders Added: 1)  Radiology Referral [Radiology] 2)  Est. Patient Level IV [95621]    Current Allergies (reviewed today): ! PENICILLIN V POTASSIUM ! CODEINE SULFATE

## 2010-08-27 NOTE — Letter (Signed)
Summary: [facility] / DR. BENJAMIN HOXWORTH  [facility] / [facility] / DR. BENJAMIN HOXWORTH   Imported By: Carin Primrose 08/30/2008 10:42:18  _____________________________________________________________________  External Attachment:    Type:   Image     Comment:   External Document

## 2010-08-27 NOTE — Progress Notes (Signed)
  Phone Note From Pharmacy   [facility] [phone] phone number Call For: dr.Juno Alers  Summary of Call: rx for alprazolam 0.5 mg called into above pharmacy for Dr. Hetty Ely  :#180 with 1 refill Initial call taken by: Providence Crosby,  June 20, 2007 10:50 AM

## 2010-08-27 NOTE — Progress Notes (Signed)
Summary: regarding mobic  Phone Note Call from Patient Call back at Work Phone [phone]   Caller: Patient Call For: [name] MD Summary of Call: Pt saw Dr. Madelon Lips yesterday for her knee and he wants her to start taking mobic 7.5 mg's daily. She wants to make sure that this will not interfere with her other meds.  I advised that it should not, but that I would send the note to you. Initial call taken by: Lowella Petties CMA, AAMA,  July 22, 2010 9:47 AM  Follow-up for Phone Call        no should not. [name] MD  July 22, 2010 9:56 AM  Advised pt. Follow-up by: Lowella Petties CMA, AAMA,  July 22, 2010 9:57 AM

## 2010-08-27 NOTE — Assessment & Plan Note (Signed)
Summary: 1 month follow up/rbh   Vital Signs:  Patient Profile:   65 Years Old Female Weight:      158 pounds Temp:     98.6 degrees F oral Pulse rate:   72 / minute Pulse rhythm:   regular BP sitting:   114 / 70  (left arm) Cuff size:   regular  Vitals Entered ByMarland Kitchen Providence Crosby (July 18, 2007 4:01 PM)                 Chief Complaint:  1 month f/u.  History of Present Illness: Doing well, feels good...no further problems with dizziness.BP remains excellent , not sure if from weight loss or medication, poss combination of both. Weight continues to be great...is keeping it off!  Current Allergies (reviewed today): ! PENICILLIN V POTASSIUM ! CODEINE SULFATE      Physical Exam  General:     Well-developed,well-nourished,in no acute distress; alert,appropriate and cooperative throughout examination Head:     Normocephalic and atraumatic without obvious abnormalities. No apparent alopecia or balding. Eyes:     Conjunctiva clear bilaterally.  Ears:     External ear exam shows no significant lesions or deformities.  Otoscopic examination reveals clear canals, tympanic membranes are intact bilaterally without bulging, retraction, inflammation or discharge. Hearing is grossly normal bilaterally. Nose:     External nasal examination shows no deformity or inflammation. Nasal mucosa are pink and moist without lesions or exudates. Mouth:     Oral mucosa and oropharynx without lesions or exudates.  Teeth in good repair. Neck:     No deformities, masses, or tenderness noted. Chest Wall:     No deformities, masses, or tenderness noted. Lungs:     Normal respiratory effort, chest expands symmetrically. Lungs are clear to auscultation, no crackles or wheezes. Heart:     Normal rate and regular rhythm. S1 and S2 normal without gallop, murmur, click, rub or other extra sounds.    Impression & Recommendations:  Problem # 1:  DIZZINESS (ICD-780.4) Assessment: Improved  Resolved but will keep on list. The following medications were removed from the medication list:    Zyrtec Allergy 10 Mg Tabs (Cetirizine hcl)   Problem # 2:  OBESITY, MORBID (ICD-278.01) Assessment: Improved Continues to be good.   Problem # 3:  HYPERTENSION, BENIGN ESSENTIAL (ICD-401.1) Assessment: Improved Excellent...cont curr meds. Her updated medication list for this problem includes:    Hyzaar 100-25 Mg Tabs (Losartan potassium-hctz) .Marland Kitchen... 1 each am  BP today: 114/70 Prior BP: 130/80 (06/19/2007)  Labs Reviewed: Creat: 0.6 (08/11/2006) Chol: 138 (08/11/2006)   HDL: 52.4 (08/11/2006)   LDL: 67 (08/11/2006)   TG: 91 (08/11/2006)   Problem # 4:  HYPERLIPIDEMIA, MIXED (ICD-272.2) Assessment: Unchanged Time to try getting back on Simvastatin...do not think it was cause of dizziness but will see. Labs Reviewed: Chol: 138 (08/11/2006)   HDL: 52.4 (08/11/2006)   LDL: 67 (08/11/2006)   TG: 91 (08/11/2006) SGOT: 17 (05/04/2007)   SGPT: 15 (05/04/2007)   Complete Medication List: 1)  Buspirone Hcl 10 Mg Tabs (Buspirone hcl) .Marland Kitchen.. 1 by mouth two times a day 2)  Metformin Hcl 500 Mg Tabs (Metformin hcl) .... Take 1 tablet by mouth in the evening 3)  Wellbutrin Xl 300 Mg Tb24 (Bupropion hcl) .Marland Kitchen.. 1 q am 4)  Tums E-x 750 Mg Chew (Calcium carbonate antacid) .Marland Kitchen.. 1 qd 5)  Accu-chek Compact Test Drum Strp (Glucose blood) .... Test blood twice daily 6)  Alprazolam 0.5 Mg Tabs (Alprazolam) .Marland KitchenMarland KitchenMarland Kitchen  1 two times a day 7)  Hyzaar 100-25 Mg Tabs (Losartan potassium-hctz) .Marland Kitchen.. 1 each am 8)  Cyclobenzaprine Hcl 10 Mg Tabs (Cyclobenzaprine hcl) .... 1/2 tablet bid   Patient Instructions: 1)  Keep appt for labs and Comp Exam as scheduled.    ]

## 2010-08-27 NOTE — Letter (Signed)
Summary: Results Follow up Letter  [facility]  [address] [place], [place] [address]   Phone: [phone]  Fax: [phone]    10/08/2009 MRN: [id number]  [name] [address] [place], [place]  [address]  Dear [name],  The following are the results of your recent test(s):  Test         Result    Pap Smear:        Normal _____  Not Normal _____ Comments: ______________________________________________________ Cholesterol: LDL(Bad cholesterol):         Your goal is less than:         HDL (Good cholesterol):       Your goal is more than: Comments:  ______________________________________________________ Mammogram:        Normal _____  Not Normal _____ Comments:  ___________________________________________________________________ Hemoccult:        Normal __X___  Not normal _______ Comments:  Please repeat in one year.  _____________________________________________________________________ Other Tests:    We routinely do not discuss normal results over the telephone.  If you desire a copy of the results, or you have any questions about this information we can discuss them at your next office visit.   Sincerely,    Laurita Quint, MD

## 2010-08-27 NOTE — Progress Notes (Signed)
Summary: Simvastatin 80 mg.  Phone Note Call from Patient   Caller: Patient Call For: Dr. Hetty Ely Summary of Call: Patient phoned in saying that she was supposed to let you know the name of her medication and the mg. on Wednesday but that she forgot to call.  The medication is Simvastatin 80 mg. and she takes 1 tablet a day. Initial call taken by: Delilah Shan,  July 21, 2007 10:46 AM    New/Updated Medications: SIMVASTATIN 80 MG  TABS (SIMVASTATIN) one tab by mouth at night

## 2010-08-27 NOTE — Progress Notes (Signed)
Summary: [facility]/Dr. Hoxworth   Imported By: Eleonore Chiquito 06/06/2007 08:46:11  _____________________________________________________________________  External Attachment:    Type:   Image     Comment:   External Document

## 2010-08-27 NOTE — Progress Notes (Signed)
Summary: Rx Alprazolam  Phone Note Refill Request Message from:  Patient on July 21, 2010 10:39 AM  Refills Requested: Medication #1:  ALPRAZOLAM 0.5 MG  TABS 1 tab by mouth two times a day.   Supply Requested: 3 months Patient request 90 day supply be sent to [facility].     Method Requested: Fax to Mail Away Pharmacy Initial call taken by: Linde Gillis CMA Duncan Dull),  July 21, 2010 10:40 AM  Follow-up for Phone Call        Patient advised via message left on machine at home, Rx sent to [facility]. Follow-up by: Linde Gillis CMA Duncan Dull),  July 22, 2010 8:56 AM    Prescriptions: ALPRAZOLAM 0.5 MG  TABS (ALPRAZOLAM) 1 tab by mouth two times a day.  #90 x 0   Entered and Authorized by:   [name] MD   Signed by:   [name] MD on 07/22/2010   Method used:   Telephoned to ...       [facility]* (retail)             ,          Ph: [phone]       Fax: [phone]   RxID:   [id number] ALPRAZOLAM 0.5 MG  TABS (ALPRAZOLAM) 1 tab by mouth two times a day.  #60 x 0   Entered and Authorized by:   [name] MD   Signed by:   [name] MD on 07/22/2010   Method used:   Telephoned to ...       [facility]* (retail)             ,          Ph: [phone]       Fax: [phone]   RxID:   [id number] ALPRAZOLAM 0.5 MG  TABS (ALPRAZOLAM) 1 tab by mouth two times a day.  #60 x 0   Entered and Authorized by:   [name] MD   Signed by:   [name] MD on 07/22/2010   Method used:   Reprint   RxID:   [id number]

## 2010-08-27 NOTE — Assessment & Plan Note (Signed)
Summary: 6 M F/U  DLO   Vital Signs:  Patient Profile:   65 Years Old Female Height:     63 inches Weight:      148 pounds Temp:     98.2 degrees F oral Pulse rate:   76 / minute Pulse rhythm:   regular BP sitting:   110 / 80  (left arm) Cuff size:   regular  Vitals Entered By: Providence Crosby (February 19, 2008 10:04 AM)                 Chief Complaint:  6 MONTH FOLLOWUP// C/O BEING TIRED.  History of Present Illness: Eating well, just saw nutrtionist this AM and is pleased. Is tired...sleeping ok. Not depressed. Had good vacation with whole family, both children and their families.    Prior Medications Reviewed Using: Patient Recall  Current Allergies (reviewed today): ! PENICILLIN V POTASSIUM ! CODEINE SULFATE        Impression & Recommendations:  Problem # 1:  GRIEF REACTION (ICD-309.0) Assessment: Improved Could this be part of Fatigue?  She admits she is worried about keeping her job as many her age are "being let go" and she is concerned about insurance for te next 2 years. Labs all look good with no reason for fatigue.  Problem # 2:  DIZZINESS (ICD-780.4) Assessment: Improved No problems today. Has been reasonably stable lately.  Problem # 3:  OBESITY, MORBID (ICD-278.01) Assessment: Unchanged Stable having significantly lost weight.  Problem # 4:  HYPERTENSION, BENIGN ESSENTIAL (ICD-401.1) Assessment: Unchanged Stable, good. Her updated medication list for this problem includes:    Hyzaar 100-25 Mg Tabs (Losartan potassium-hctz) .Marland Kitchen... 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 Scripts redone. Her updated medication list for this problem includes:    Buspirone Hcl 10 Mg Tabs (Buspirone hcl) .Marland Kitchen... 1 by mouth two  times a day    Wellbutrin Xl 300 Mg Tb24 (Bupropion hcl) .Marland Kitchen... 1 q am    Alprazolam 0.5 Mg Tabs (Alprazolam) .Marland Kitchen... 1 two times a day   Problem # 6:  AODM (ICD-250.00) Assessment: Improved Great with/from weight loss. Her updated medication list for this problem includes:    Metformin Hcl 500 Mg Tabs (Metformin hcl) .Marland Kitchen... Take 1 tablet by mouth in the evening    Hyzaar 100-25 Mg Tabs (Losartan potassium-hctz) .Marland Kitchen... 1 each am  Labs Reviewed: HgBA1c: 5.5 (02/14/2008)   Creat: 0.8 (08/11/2007)   Microalbumin: 0.8 (08/11/2007)   Complete Medication List: 1)  Buspirone Hcl 10 Mg Tabs (Buspirone hcl) .Marland Kitchen.. 1 by mouth two times a day 2)  Metformin Hcl 500 Mg Tabs (Metformin hcl) .... Take 1 tablet by mouth in the evening 3)  Wellbutrin Xl 300 Mg Tb24 (Bupropion hcl) .Marland Kitchen.. 1 q am 4)  Accu-chek Compact Test Drum Strp (Glucose blood) .... Test blood twice daily 5)  Alprazolam 0.5 Mg Tabs (Alprazolam) .Marland Kitchen.. 1 two times a day 6)  Hyzaar 100-25 Mg Tabs (Losartan potassium-hctz) .Marland Kitchen.. 1 each am 7)  Cyclobenzaprine Hcl 10 Mg Tabs (Cyclobenzaprine hcl) .Marland Kitchen.. 1 tablet bid 8)  Simvastatin 80 Mg Tabs (Simvastatin) .... One tab by mouth at night   Patient Instructions: 1)  RTC 6 months, Comp Exam, labs prior   Prescriptions: CYCLOBENZAPRINE HCL 10 MG  TABS (CYCLOBENZAPRINE HCL) 1 tablet bid  #180 x 3   Entered by:   Providence Crosby   Authorized by:   Shaune Leeks MD   Signed by:   Providence Crosby on 02/19/2008   Method used:   Print then Give to Patient   RxID:   [id number] ALPRAZOLAM 0.5 MG  TABS (ALPRAZOLAM) 1 two times a day  #180 x 0   Entered by:   Providence Crosby   Authorized by:   Shaune Leeks MD   Signed by:   Providence Crosby on 02/19/2008   Method used:   Print then Give to Patient   RxID:   [id number] WELLBUTRIN XL 300 MG  TB24 (BUPROPION HCL) 1 Q AM  #90 x 3   Entered by:   Providence Crosby   Authorized by:   Shaune Leeks MD   Signed by:   Providence Crosby on 02/19/2008    Method used:   Print then Give to Patient   RxID:   [id number]  ]

## 2010-08-27 NOTE — Assessment & Plan Note (Signed)
Summary: EAR ACHE / LFW   Vital Signs:  Patient Profile:   65 Years Old Female Height:     63 inches Weight:      150 pounds Temp:     96.3 degrees F tympanic Pulse rate:   72 / minute Pulse rhythm:   regular BP sitting:   120 / 80  (left arm) Cuff size:   regular  Vitals Entered By: Providence Crosby (September 18, 2007 11:50 AM)                 Chief Complaint:  c/o left ear pain since saturday// right ear burning // sinus congestion//check 2 toes.  History of Present Illness: Here for both great nails hurting around the margin of the nail...no discarge, swelling or inflammation. Also having discomfort in the left> right ear esp with passage of weather system last night. No fever or chills, minimal to no cough or runny nose. Hurts facially.    Prior Medications Reviewed Using: Patient Recall  Current Allergies (reviewed today): ! PENICILLIN V POTASSIUM ! CODEINE SULFATE      Physical Exam  General:     Well-developed,well-nourished,in no acute distress; alert,appropriate and cooperative throughout examination, comfortable appearing. Head:     Normocephalic and atraumatic without obvious abnormalities. No apparent alopecia or balding. Max sinuses mildly tender to direct palpation. Eyes:     Conjunctiva clear bilaterally.  Ears:     External ear exam shows no significant lesions or deformities.  Otoscopic examination reveals clear canals, tympanic membranes are intact bilaterally without bulging, retraction, inflammation or discharge. Hearing is grossly normal bilaterally. Mildly sluggish mobility. Nose:     External nasal examination shows no deformity or inflammation. Nasal mucosa are pink and moist without lesions or exudates. Mouth:     Oral mucosa and oropharynx without lesions or exudates.  Teeth in good repair.m Mild pharyngeal inflammation, esp on left. Neck:     No deformities, masses, or tenderness noted. Chest Wall:     No deformities, masses, or  tenderness noted. Lungs:     Normal respiratory effort, chest expands symmetrically. Lungs are clear to auscultation, no crackles or wheezes. Heart:     Normal rate and regular rhythm. S1 and S2 normal without gallop, murmur, click, rub or other extra sounds. Extremities:     Left great nail longer than toe proper....told to trim nails straight across closer to avoid trama of the nail with walking causin irritation of the periphery of the nailbed.  Diabetes Management Exam:    Foot Exam (with socks and/or shoes not present):       Sensory-Monofilament:          Left foot: normal          Right foot: normal       Inspection:          Left foot: normal          Right foot: normal       Nails:          Left foot: normal          Right foot: normal    Impression & Recommendations:  Problem # 1:  URI (ICD-465.9) Assessment: New See instructions. Instructed on symptomatic treatment. Call if symptoms persist or worsen.   Problem # 2:  TOE PAIN (ICD-729.5) Assessment: New Looks to be due to trauma of long nails...taught to trim.  Complete Medication List: 1)  Buspirone Hcl 10 Mg Tabs (Buspirone hcl) .Marland Kitchen.. 1 by  mouth two times a day 2)  Metformin Hcl 500 Mg Tabs (Metformin hcl) .... Take 1 tablet by mouth in the evening 3)  Wellbutrin Xl 300 Mg Tb24 (Bupropion hcl) .Marland Kitchen.. 1 q am 4)  Tums E-x 750 Mg Chew (Calcium carbonate antacid) .Marland Kitchen.. 1 qd 5)  Accu-chek Compact Test Drum Strp (Glucose blood) .... Test blood twice daily 6)  Alprazolam 0.5 Mg Tabs (Alprazolam) .Marland Kitchen.. 1 two times a day 7)  Hyzaar 100-25 Mg Tabs (Losartan potassium-hctz) .Marland Kitchen.. 1 each am 8)  Cyclobenzaprine Hcl 10 Mg Tabs (Cyclobenzaprine hcl) .Marland Kitchen.. 1 tablet bid 9)  Simvastatin 80 Mg Tabs (Simvastatin) .... One tab by mouth at night   Patient Instructions: 1)  For congestion: 2)  GUAIFENESIN  600mg  by mouth AM and NOON   3)    [facility] MUCOUS RELIEF EXPECTORANT (400 mg) 11/2 TABS                    4)  Gargle  every 1/2 hour with warm salt water for two days. 5)  Call if sxs don't improve.      ]

## 2010-08-27 NOTE — Assessment & Plan Note (Signed)
Summary: DIZZY/ 11:15   Vital Signs:  Patient profile:   65 year old female Height:      62 inches Weight:      157.2 pounds BMI:     28.86 O2 Sat:      99 % on Room air Temp:     98.6 degrees F oral Pulse rate:   80 / minute Pulse (ortho):   82 / minute Pulse rhythm:   regular BP supine:   140 / 90  (right arm) BP sitting:   140 / 82  (right arm) BP standing:   138 / 78  (right arm) Cuff size:   large  Vitals Entered By: Benny Lennert CMA Duncan Dull) (December 29, 2009 11:09 AM)  O2 Flow:  Room air  History of Present Illness: Chief complaint dizzy  65 year old female:  Was at work today, then started to get some vertigo / dizzy sensations  started back in february, fainted at home and admitted for syncope with a very large work-up undergone. Normal cardiac work-up, MRI of brain normal.  Reviewed all notes, which includes large scale work-up. ? if this was orthostasis at the time.  Has been ongoing since then, now this has been getting more frequent.  Does describe some true vertigo that will occur.  Last wednesday was really bad, sat, sunday, monday.  did have some vertgo earlier. inducible   Allergies: 1)  ! Penicillin V Potassium 2)  ! Codeine Sulfate  Past History:  Past medical, surgical, family and social histories (including risk factors) reviewed, and no changes noted (except as noted below).  Past Medical History: Reviewed history from 02/15/2009 and no changes required. 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 NSVD X 2 T AND A 1970'S TAH BSO  DYSMENNORHEA  1988 H/O BELL'S PALSY  15 YOA 19YOA 03/1995 BLADDER TACK 11/1995 ABD U/S NML 03/1996 ECHO NML 9/97  ETT POS 08/1997 CATH NML 10/1997 FLEX SIG HEMMS O/W NML 12/1997 SEPTOPLASTY W/ ANTRAL WINDOW  ([place])  08/1998 BREAST CYST ASPIRATION  (DR YOUNG)  12/1998 BREAST BX (YOUNG) NML 05/20/2000 SLEEP STUDY DECR SLEEP TIME, DECR REM SLEEP, DESAT 86%  07/2000 ETT CARDIOLITE NML EF 75% 12/26/2000 ECHO MILD LVH,LAH,MR, TR EF 50-55%   12/26/2000 ETT CARDIOLITE NML EF 70%  09/2001 HOSP R/O'D MI  10/06/2001 PARATHYROID SCAN  NML  03/15/2002 ARTHROSCOPY R KNEE  12/12/2002 CT HEAD NML  02/10/2003 HOSP SYNCOPE W/ BILAT ELBOW FX'S, TIB PLATEAU FX R KNEE  02/10/2003 CATH NML  CAROTID U/S MILD BILAT DZ  02/12/2003 HOSP  BILAT BASILAR PE'S   8/6-03/04/2003 CATTARACT REMOVALS  R 09/24/2002   L 05/28/2003 DIABETIC MAINT REFERRAL 09/2004 TOE EXCISION, L FIFTH TOE 12/2004 ETT MYOVIEW  01/10/2006  05/2005 HOSP  SYNCOPE, UNK ETIOL  ORTHOSTASIS  HYPOKALEMIA   2/14-2/16/2011 CT HEAD W/O  ATROPHY AND CHRONIC SM VESS DZ 09/08/2009 CT C-SPINE 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 Father dec 63 MI COPD ( Smoker) TB Mother dec 70 Natural Causes Sister A 36 DM Probs w/ leg due Brown Recluse bite, Vision loss...legally  blind  Social History: Reviewed history from 08/29/2008 and no changes required. Occupation: [employer] Married/ Widowed  03/11/2007  2 children  Son DM and obese. Never Smoked Alcohol use-no Drug use-no  Review of Systems      See HPI General:  Denies chills, fatigue, and fever. Neuro:  Complains of sensation of room spinning.  Physical Exam  General:  Well-developed,well-nourished,in no acute distress; alert,appropriate and cooperative throughout examination, minimally overweight. Head:  Normocephalic and atraumatic without obvious abnormalities. No apparent alopecia or balding.  Ears:  no external deformities.    In supine position, with patient, moved and rotated head, and the patient had inducible true vertigo and nausea Nose:  no external deformity.   Lungs:  Normal respiratory effort, chest expands symmetrically. Lungs are clear to auscultation, no crackles or wheezes. Heart:  Normal rate and regular rhythm. S1 and S2 normal without gallop, murmur, click, rub or other extra sounds. Psych:  Cognition and judgment appear intact. Alert and cooperative with normal attention span and concentration. No apparent delusions, illusions, hallucinations   Impression & Recommendations:  Problem # 1:  VERTIGO (ICD-780.4) I discussed at length with patient and daughter. I suspect that there is a component of inner ear dysfunction. Not orthostatic today.   Rec vestibular rehab. Patient was hesitant about vestibular rehab and wanted to discuss with Dr. Dorma Russell before doing this, which I think is reasonable.  Her updated medication list for this problem includes:    Meclizine Hcl 25 Mg Tabs (Meclizine hcl) .Marland Kitchen... 1/2 to 1 tab by mouth q 6 hours as needed dizziness / vertigo  Orders: ENT Referral (ENT)  Complete Medication List: 1)  Wellbutrin Xl 300 Mg Tb24 (Bupropion hcl) .... Take one by mouth every am 2)  Alprazolam 0.5 Mg Tabs (Alprazolam) .Marland Kitchen.. 1 two times a day 3)  Simvastatin  80 Mg Tabs (Simvastatin) .... One tab by mouth at night 4)  Centrum Silver Tabs (Multiple vitamins-minerals) .Marland Kitchen.. 1 daily by mouth 5)  Aspirin 81 Mg Tbec (Aspirin) .... Take one tablet by mouth daily 6)  Vitamin B-12 1000 Mcg Tabs (Cyanocobalamin) .Marland Kitchen.. 1 tab once daily 7)  Losartan Potassium-hctz 100-25 Mg Tabs (Losartan potassium-hctz) .... Take one by mouth daily 8)  Meclizine Hcl 25 Mg Tabs (Meclizine hcl) .... 1/2 to 1 tab by mouth q 6 hours as needed dizziness / vertigo  Patient Instructions: 1)  Referral Appointment Information 2)  Day/Date: 3)  Time: 4)  Place/MD: 5)  Address: 6)  Phone/Fax: 7)  Patient given appointment information. Information/Orders faxed/mailed.  Prescriptions: MECLIZINE HCL 25 MG TABS (MECLIZINE HCL) 1/2 to 1 tab by mouth q 6 hours as needed dizziness / vertigo  #30 x 0   Entered and Authorized by:   Hannah Beat MD   Signed by:   Hannah Beat MD on 12/29/2009   Method used:   [facility] to        [phone][facility] (retail)       [address]  [place], [place]  [address]       Ph: [phone] or [phone]       Fax: [phone]   RxID:   [id number]   Current Allergies (reviewed today): ! PENICILLIN V POTASSIUM ! CODEINE SULFATE

## 2010-08-27 NOTE — Letter (Signed)
Summary: Results Follow up Letter  [place] [facility]  [address] [place], [place] [address]   Phone: [phone]  Fax: [phone]    09/01/2007 MRN: [id number]  [name] [address] [place], [place]  [address]  Dear [name],  The following are the results of your recent test(s):  Test         Result    Pap Smear:        Normal _____  Not Normal _____ Comments: ______________________________________________________ Cholesterol: LDL(Bad cholesterol):         Your goal is less than:         HDL (Good cholesterol):       Your goal is more than: Comments:  ______________________________________________________ Mammogram:        Normal _____  Not Normal _____ Comments:  ___________________________________________________________________ Hemoccult:        Normal _x____  Not normal _______ Comments:  No blood in stool. Thank you for returning the hemocult cards. Please make sure to repeat in one year.  _____________________________________________________________________ Other Tests:    We routinely do not discuss normal results over the telephone.  If you desire a copy of the results, or you have any questions about this information we can discuss them at your next office visit.   Sincerely,

## 2010-08-27 NOTE — Progress Notes (Signed)
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 Please call patient when ready.    Method Requested: Pick up at Office Initial call taken by: Melody Comas,  March 19, 2010 11:13 AM  Follow-up for Phone Call        signed. please give to patient.  Follow-up by: Crawford Givens MD,  March 19, 2010 1:32 PM  Additional Follow-up for Phone Call Additional follow up Details #1::        Patient Advised. Prescription left at front desk.  Additional Follow-up by: Delilah Shan CMA Jamie Belger Dull),  March 19, 2010 2:36 PM    Prescriptions: LOSARTAN POTASSIUM-HCTZ 100-25 MG TABS (LOSARTAN POTASSIUM-HCTZ) Take one by mouth daily  #90 Tablet x 3   Entered and Authorized by:   Crawford Givens MD   Signed by:   Crawford Givens MD on 03/19/2010   Method used:   Print then Give to Patient   RxID:   [id number] SIMVASTATIN 80 MG  TABS (SIMVASTATIN) one tab by mouth at night  #90 x 3   Entered and Authorized by:   Crawford Givens MD   Signed by:   Crawford Givens MD on 03/19/2010   Method used:   Print then Give to Patient   RxID:   [id number] ALPRAZOLAM 0.5 MG  TABS (ALPRAZOLAM) 1 two times a day  #180 x 1   Entered and Authorized by:   Crawford Givens MD   Signed by:   Crawford Givens MD on 03/19/2010   Method used:   Print then Give to Patient   RxID:   [id number] WELLBUTRIN XL 300 MG  TB24 (BUPROPION HCL) Take one by mouth every am  #90 x 3   Entered and Authorized by:   Crawford Givens MD   Signed by:   Crawford Givens MD on 03/19/2010   Method used:   Print then Give to Patient   RxID:   [id number]

## 2010-08-27 NOTE — Progress Notes (Signed)
Summary: needs 90 day script for alprazolam  Phone Note Call from Patient Call back at Work Phone [phone]   Caller: Patient Call For: dr Domingue Coltrain Summary of Call: Pt needs 3 month printed script for alprazolam for mail order, please call when ready. Initial call taken by: Lowella Petties,  May 28, 2008 2:08 PM      Prescriptions: ALPRAZOLAM 0.5 MG  TABS (ALPRAZOLAM) 1 two times a day  #180 x 0   Entered by:   Providence Crosby   Authorized by:   Shaune Leeks MD   Signed by:   Providence Crosby on 05/28/2008   Method used:   Print then Give to Patient   RxID:   [id number]   Appended Document: needs 90 day script for alprazolam PATIENT NOTIFIED TO PICK UP

## 2010-08-27 NOTE — Progress Notes (Signed)
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  She states Dr. Hetty Ely is aware of this.  She is complaining of numbness in her elbow down to her fingers and she is asking if she needs to be seen here or should she be referred.  She prefers to go to [facility] if referred. Initial call taken by: Lowella Petties CMA,  October 13, 2009 9:13 AM  Follow-up for Phone Call        we cannot acommodate the next few days refer [facility] nonemergent Follow-up by: Hannah Beat MD,  October 13, 2009 9:18 AM

## 2010-08-27 NOTE — Letter (Signed)
Summary: Results Follow up Letter   at [facility]  [address] [place], [place] [address]   Phone: [phone]  Fax: [phone]    01/09/2007 MRN: [id number]  [name] [address] [place], [place]  [address]  Dear [name],  The following are the results of your recent test(s):  Test         Result    Pap Smear:        Normal _____  Not Normal _____ Comments: ______________________________________________________ Cholesterol: LDL(Bad cholesterol):         Your goal is less than:         HDL (Good cholesterol):       Your goal is more than: Comments:  ______________________________________________________ Mammogram:        Normal _x____  Not Normal _____ Comments:YOUR MAMMOGRAM REPORT WAS NORMAL. PLEASE MAKE SURE TO REPEAT IN ONE YEAR.  ___________________________________________________________________ Hemoccult:        Normal _____  Not normal _______ Comments:    _____________________________________________________________________ Other Tests:    We routinely do not discuss normal results over the telephone.  If you desire a copy of the results, or you have any questions about this information we can discuss them at your next office visit.   Sincerely,

## 2010-08-27 NOTE — Letter (Signed)
Summary: Dr.William Caffrey,[facility],Note  Dr.William Caffrey,[facility],Note   Imported By: Beau Fanny 10/16/2009 09:59:11  _____________________________________________________________________  External Attachment:    Type:   Image     Comment:   External Document

## 2010-08-27 NOTE — Assessment & Plan Note (Signed)
Summary: F1Y/ANAS  Medications Added ZYRTEC ALLERGY 10 MG TABS (CETIRIZINE HCL) 1 tab at bedtime        Visit Type:  1 yfr f/u Primary Provider:  Shaune Leeks MD  CC:  pt states she has noticed in the last 4-5 months that she staggers to her right and states also that her handwritting is not as good since then ...edema/feet...denies any cp or sob..pt states on Valentine's Day she was in her tub and had an episode of vertigo and fell backwards and hit her head and was knocked unconscious and was taken to the ED and admitted for 2 days in [facility]  History of Present Illness: [name] comes in today for evaluation and management of her history of metabolic syndrome and substantial cardiovascular risk.  Since her lab band, she has lost substantial weight. Her hypoglycemia has resolved, her hypertension is under good control with minimal blood pressure meds, her recent lipid panel was remarkably improved with a total cholesterol 167, triglycerides 107, HDL 86.5, VLDL 21.4 and LDL 59. Note her fasting blood sugar was 87 and her hemoglobin A1c was 5.7. In order remarkable improvement. She remains on simvastatin at 80 but is not on other meds that interfere with his metabolism. She's had no problems so we'll keep it at that dose.  She had a episode of syncope in the bathtub on September 08, 2009. Prior to the event she experienced vertigo. She has had a complete neurological evaluation. CT Scan was negative. Hospitalization resulted in a finding of her syncope. She denied any cardiovascular symptoms prior to the event. She is follow up with neurology as an outpatient as well as ENT. She has not had another event. Her biggest complaint is unsteadiness of gait. I advised her to get this checked out again with neurology. It does not sound cardiovascular.  Current Medications (verified): 1)  Wellbutrin Xl 300 Mg  Tb24 (Bupropion Hcl) .... Take One By Mouth Every Am 2)  Alprazolam 0.5 Mg  Tabs  (Alprazolam) .Marland Kitchen.. 1 Two Times A Day 3)  Simvastatin 80 Mg  Tabs (Simvastatin) .... One Tab By Mouth At Night 4)  Centrum Silver  Tabs (Multiple Vitamins-Minerals) .Marland Kitchen.. 1 Daily By Mouth 5)  Aspirin 81 Mg Tbec (Aspirin) .... Take One Tablet By Mouth Daily 6)  Vitamin B-12 1000 Mcg Tabs (Cyanocobalamin) .Marland Kitchen.. 1 Tab Once Daily 7)  Losartan Potassium-Hctz 100-25 Mg Tabs (Losartan Potassium-Hctz) .... Take One By Mouth Daily 8)  Meclizine Hcl 25 Mg Tabs (Meclizine Hcl) .... 1/2 To 1 Tab By Mouth Q 6 Hours As Needed Dizziness / Vertigo 9)  Zyrtec Allergy 10 Mg Tabs (Cetirizine Hcl) .Marland Kitchen.. 1 Tab At Bedtime  Allergies: 1)  ! Penicillin V Potassium 2)  ! 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) POSTMENOPAUSAL ON HORMONE REPLACEMENT THERAPY (ICD-V07.4) ALLERGY (ICD-995.3) GRIEF REACTION (ICD-309.0) NEED PROPHYLACTIC VACCINATION&INOCULATION FLU (ICD-V04.81)    Past Surgical History: Last updated: 09/11/2009 NSVD X 2 T AND A 1970'S TAH BSO  DYSMENNORHEA  1988 H/O BELL'S PALSY  15 YOA 20YOA 03/1995 BLADDER TACK 11/1995 ABD U/S NML 03/1996 ECHO NML 9/97  ETT POS 08/1997 CATH NML 10/1997  FLEX SIG HEMMS O/W NML 12/1997 SEPTOPLASTY W/ ANTRAL WINDOW  ([facility])  08/1998 BREAST CYST ASPIRATION  (DR YOUNG)  12/1998 BREAST BX (YOUNG) NML 05/20/2000 SLEEP STUDY DECR SLEEP TIME, DECR REM SLEEP, DESAT 86%  07/2000 ETT CARDIOLITE NML EF 75% 12/26/2000 ECHO MILD LVH,LAH,MR, TR EF 50-55%    12/26/2000 ETT CARDIOLITE NML EF 70%  09/2001 HOSP R/O'D MI  10/06/2001 PARATHYROID SCAN  NML  03/15/2002 ARTHROSCOPY R KNEE  12/12/2002 CT HEAD NML  02/10/2003 HOSP SYNCOPE W/ BILAT ELBOW FX'S, TIB PLATEAU FX R KNEE  02/10/2003 CATH NML  CAROTID U/S MILD BILAT DZ  02/12/2003 HOSP  BILAT BASILAR PE'S   8/6-03/04/2003 CATTARACT REMOVALS  R 09/24/2002   L 05/28/2003 DIABETIC MAINT REFERRAL 09/2004 TOE EXCISION, L FIFTH TOE 12/2004 ETT MYOVIEW  01/10/2006  05/2005 HOSP  SYNCOPE, UNK ETIOL  ORTHOSTASIS  HYPOKALEMIA   2/14-2/16/2011 CT HEAD W/O  ATROPHY AND CHRONIC SM VESS DZ 09/08/2009 CT C-SPINE NO ACUTE  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 09/08/2009 CT ANGIOGRAM CHEST  NO PE 09/08/2009 MRI C-SPINE  NEW FOCAL SOFT DISK HERNIATION C6/7 AFFECTING C7 ROOT SM EFFECT C5 ROOT 09/08/2009 MRI BRAIN NO INFARCT  PROM SM VESS DZ  ATROPHY 09/08/2009 ECHO MILD LVH EF 65%  MILDLY CALCIF MV  NML AV  09/09/2009 LE VENOUS DOPPLERS NML 09/08/2009 CAROTID U/S  NML 09/10/2009  Family History: Last updated: 09/29/2009 Father dec 63 MI COPD ( Smoker) TB Mother dec 70 Natural Causes Sister A 54 DM Probs w/ leg due Brown Recluse bite, Vision loss...legally blind  Social History: Last updated: 08/29/2008 Occupation: [employer] Married/ Widowed  03/11/2007  2 children  Son DM and obese. Never Smoked Alcohol use-no Drug use-no  Risk Factors: Alcohol Use: 0 (09/29/2009) Caffeine Use: 0 (09/29/2009) Exercise: yes (09/29/2009)  Risk Factors: Smoking Status: never (09/29/2009) Passive Smoke Exposure: no (09/29/2009)  Review of Systems       negative other than history of present illness  Vital Signs:  Patient profile:   65 year old female Height:      62 inches Weight:      154 pounds BMI:     28.27 Pulse rate:   78 / minute Pulse rhythm:   irregular BP sitting:   118 / 80  (right arm) Cuff size:   large  Vitals Entered By: [name], CMA (March 03, 2010 9:44 AM)  Physical Exam  General:   overweight but has lost a major amount of weight. No acute distress Head:  normocephalic and atraumatic Eyes:  PERRLA/EOM intact; conjunctiva and lids normal. Neck:  Neck supple, no JVD. No masses, thyromegaly or abnormal cervical nodes. Chest Vernee Baines:  no deformities or breast masses noted Lungs:  Clear bilaterally to auscultation and percussion. Heart:  Non-displaced PMI, chest non-tender; regular rate and rhythm, S1, S2 without murmurs, rubs or gallops. Carotid upstroke normal, no bruit. Normal abdominal aortic siz Pedals normal pulses. No edema, no varicosities. Abdomen:  good bowel sounds, organomegaly difficult to assess Msk:  decreased ROM.   Pulses:  pulses normal in all 4 extremities Extremities:  No clubbing or cyanosis. Neurologic:  Alert and oriented x 3. 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 Skin:  Intact without lesions or rashes. Psych:  Normal affect.   Impression & Recommendations:  Problem # 1:  VERTIGO (ICD-780.4) Assessment New This does not sound cardiac. She has a history of syncope in the past that we could never clearly differentiate as well. We will see her back  p.r.n.  Problem # 2:  Hx of METABOLIC SYNDROME X (ICD-277.7) Assessment: Improved With gastric band, she's lost substantial weight and cured her hyperglycemia. Her blood pressure is under excellent control and her Lipitor as well. I have congratulated on this today. Cardiovascular risk is substantially lower. I'll see her back p.r.n.  Problem # 3:  HYPERTENSION, BENIGN ESSENTIAL (ICD-401.1) Assessment: Improved  Her updated medication list for this problem includes:    Aspirin 81 Mg Tbec (Aspirin) .Marland Kitchen... Take one tablet by mouth daily    Losartan Potassium-hctz 100-25 Mg Tabs (Losartan potassium-hctz) .Marland Kitchen... Take one by mouth daily  Orders: EKG w/ Interpretation (93000)  Problem # 4:  OBESITY, MORBID (ICD-278.01) Assessment: Improved  Problem # 5:  AODM (ICD-250.00) Assessment:  Improved resolved Her updated medication list for this problem includes:    Aspirin 81 Mg Tbec (Aspirin) .Marland Kitchen... Take one tablet by mouth daily    Losartan Potassium-hctz 100-25 Mg Tabs (Losartan potassium-hctz) .Marland Kitchen... Take one by mouth daily  Patient Instructions: 1)  Your physician recommends that you schedule a follow-up appointment in:  as needed with Dr. Daleen Squibb 2)  Your physician recommends that you continue on your current medications as directed. Please refer to the Current Medication list given to you today.  Appended Document: [facility]      Allergies: 1)  ! Penicillin V Potassium 2)  ! Codeine Sulfate   EKG  Procedure date:  03/03/2010  Findings:      normal sinus rhythm, incomplete right bundle eft axis, no acute changes

## 2010-08-27 NOTE — Progress Notes (Signed)
  Phone Note From Other Clinic Call back at [phone]   Caller: [facility] Summary of Call: Call Report- Upper Extremity Venous normal also checked arteries which are normal also. Initial call taken by: [name],  September 05, 2009 2:10 PM  Follow-up for Phone Call        Notify pt that dopplers show no clot.  We can either wait and have her follow up with Dr. Hetty Ely Monday or send her for X-ray neck today. let me know.. Also if pain imrpoved with medicaiton? Follow-up by: Kerby Nora MD,  September 05, 2009 2:19 PM  Additional Follow-up for Phone Call Additional follow up Details #1::        lmom Additional Follow-up by: Benny Lennert CMA Duncan Dull),  September 05, 2009 2:37 PM    Additional Follow-up for Phone Call Additional follow up Details #2::    patient had appt with dr schaller today  Follow-up by: Benny Lennert CMA Duncan Dull),  September 08, 2009 9:02 AM

## 2010-08-27 NOTE — Assessment & Plan Note (Signed)
Summary: DIZZY/DLO   Vital Signs:  Patient profile:   65 year old female Height:      62 inches Weight:      153 pounds BMI:     28.09 Temp:     98.3 degrees F oral Pulse rate:   68 / minute Pulse rhythm:   regular BP sitting:   92 / 60  (right arm) Cuff size:   regular  Vitals Entered By: Linde Gillis CMA Duncan Dull) (April 22, 2010 9:39 AM) CC: dizziness   History of Present Illness: 65 yo dizziness.  Dizziness- has had sycopal episodes in past, reviewed records.  Most recently was in 08/2009- complete TIA and cardiac workup that was negative at [facility], including neg dopplers, MRI, 2 decho. Over weekend, stepped out of shower, felt like she was going to pass out.  Did not actually pass out.  No focal neurological deficits atlhough her daughter though her speech was a little delayed.  CBG was 85. Has felt very dizzy for awhile when she stands from a seated position. No CP or SOB.   BP low today, has not yet taken her blood pressure medication this morning.  Depression- stopped taking the Wellbutrin and has not noticed any difference at all . Does not feel as depressed as she did when her husband died but her daughter is here with her today and states that she is depressed most of the time. No SI or HI.    Current Medications (verified): 1)  Alprazolam 0.5 Mg  Tabs (Alprazolam) .Marland Kitchen.. 1 Tab By Mouth Two Times A Day. 2)  Simvastatin 80 Mg  Tabs (Simvastatin) .... One Tab By Mouth At Night 3)  Centrum Silver  Tabs (Multiple Vitamins-Minerals) .Marland Kitchen.. 1 Daily By Mouth 4)  Aspirin 81 Mg Tbec (Aspirin) .... Take One Tablet By Mouth Daily 5)  Vitamin B-12 2500 Mcg Subl (Cyanocobalamin) .... Take One Daily 6)  Losartan Potassium-Hctz 50-12.5 Mg Tabs (Losartan Potassium-Hctz) .Marland Kitchen.. 1 Tab By Mouth Daily. 7)  Meclizine Hcl 25 Mg Tabs (Meclizine Hcl) .... 1/2 To 1 Tab By Mouth Q 6 Hours As Needed Dizziness / Vertigo 8)  Zyrtec Allergy 10 Mg Tabs (Cetirizine Hcl) .Marland Kitchen.. 1 Tab At Bedtime 9)   Fluoxetine Hcl 20 Mg  Tabs (Fluoxetine Hcl) .... Take 1 Tab By Mouth Daily  Allergies: 1)  ! Penicillin V Potassium 2)  ! 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) POSTMENOPAUSAL ON HORMONE REPLACEMENT THERAPY (ICD-V07.4) ALLERGY (ICD-995.3) GRIEF REACTION (ICD-309.0) NEED PROPHYLACTIC VACCINATION&INOCULATION FLU (ICD-V04.81)    Past Surgical History: Last updated: 09/11/2009 NSVD X 2 T AND A 1970'S TAH BSO  DYSMENNORHEA  1988 H/O BELL'S PALSY  15 YOA 20YOA 03/1995 BLADDER TACK 11/1995 ABD U/S NML 03/1996 ECHO NML 9/97  ETT POS 08/1997 CATH NML 10/1997 FLEX SIG HEMMS O/W NML 12/1997 SEPTOPLASTY W/ ANTRAL WINDOW  ([facility])  08/1998 BREAST CYST ASPIRATION  (DR YOUNG)  12/1998 BREAST BX (YOUNG) NML 05/20/2000 SLEEP STUDY DECR SLEEP TIME, DECR REM SLEEP, DESAT 86%  07/2000 ETT CARDIOLITE NML EF 75% 12/26/2000 ECHO MILD LVH,LAH,MR, TR EF 50-55%   12/26/2000 ETT CARDIOLITE NML EF 70%  09/2001 HOSP R/O'D MI  10/06/2001 PARATHYROID SCAN  NML  03/15/2002 ARTHROSCOPY R KNEE  12/12/2002 CT HEAD NML  02/10/2003 HOSP SYNCOPE W/ BILAT ELBOW FX'S, TIB PLATEAU FX R KNEE  02/10/2003 CATH NML  CAROTID U/S MILD BILAT DZ  02/12/2003 HOSP  BILAT BASILAR PE'S   8/6-03/04/2003 CATTARACT REMOVALS  R 09/24/2002   L 05/28/2003 DIABETIC MAINT REFERRAL 09/2004 TOE EXCISION, L FIFTH TOE 12/2004 ETT MYOVIEW  01/10/2006  05/2005 HOSP  SYNCOPE, UNK ETIOL  ORTHOSTASIS  HYPOKALEMIA   2/14-2/16/2011 CT  HEAD W/O  ATROPHY AND CHRONIC SM VESS DZ 09/08/2009 CT C-SPINE NO ACUTE  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 09/08/2009 CT ANGIOGRAM CHEST  NO PE 09/08/2009 MRI C-SPINE  NEW FOCAL SOFT DISK HERNIATION C6/7 AFFECTING C7 ROOT SM EFFECT C5 ROOT 09/08/2009 MRI BRAIN NO INFARCT  PROM SM VESS DZ  ATROPHY 09/08/2009 ECHO MILD LVH EF 65%  MILDLY CALCIF MV  NML AV  09/09/2009 LE VENOUS DOPPLERS NML 09/08/2009 CAROTID U/S  NML 09/10/2009  Family History: Last updated: 09/29/2009 Father dec 63 MI COPD ( Smoker) TB Mother dec 70 Natural Causes Sister A 17 DM Probs w/ leg due Brown Recluse bite, Vision loss...legally blind  Social History: Last updated: 08/29/2008 Occupation: [employer] Married/ Widowed  03/11/2007  2 children  Son DM and obese. Never Smoked Alcohol use-no Drug use-no  Risk Factors: Alcohol Use: 0 (09/29/2009) Caffeine Use: 0 (09/29/2009) Exercise: yes (09/29/2009)  Risk Factors: Smoking Status: never (09/29/2009) Passive Smoke Exposure: no (09/29/2009)  Review of Systems      See HPI General:  Denies fever. Eyes:  Denies blurring. ENT:  Denies difficulty swallowing. CV:  Denies chest pain or discomfort. Resp:  Denies shortness of breath. GI:  Denies abdominal pain and change in bowel habits. MS:  Denies joint pain, joint redness, and joint swelling. Neuro:  Denies falling down, headaches, inability to speak, poor balance, seizures, sensation of room spinning, visual disturbances, and weakness.  Physical Exam  General:  Well-developed,well-nourished,in no acute distress; alert,appropriate and cooperative throughout examination, minimally overweight. Mouth:  Oral mucosa and oropharynx without lesions or exudates.  Teeth in good repair. Neck:  No deformities, masses, or tenderness noted. No bruits Lungs:  Normal respiratory effort, chest expands symmetrically. Lungs are clear to auscultation, no crackles or wheezes. Heart:  Normal rate and regular rhythm. S1 and  S2 normal without gallop, murmur, click, rub or other extra sounds. Abdomen:  Bowel sounds positive,abdomen soft and non-tender without masses, organomegaly or hernias noted. Extremities:  No clubbing, cyanosis, edema, or deformity noted with normal full range of motion of all joints.   Neurologic:  No cranial nerve deficits noted. Station and gait are normal. Sensory, motor and coordinative functions appear intact. Psych:  Cognition and judgment appear intact. Alert and cooperative with normal attention span and concentration. No apparent delusions, illusions, hallucinations   Impression & Recommendations:  Problem # 1:  POSTURAL LIGHTHEADEDNESS (ICD-780.4) Assessment Deteriorated Previous TIA, cardiac work up negative. At this point, likely due to hypotension. Will decrease dose of Losartan-HCTZ and have pt follow up in one month. Her updated medication list for this problem includes:    Meclizine Hcl 25 Mg Tabs (Meclizine hcl) .Marland Kitchen... 1/2 to 1 tab by mouth q 6 hours as needed dizziness / vertigo    Zyrtec Allergy 10 Mg Tabs (Cetirizine hcl) .Marland Kitchen... 1 tab at bedtime  Problem # 2:  ANXIETY DEPRESSION (ICD-300.4) Assessment: Deteriorated  Discussed counselling but pt is uninterested at this time. Will start SSRI, pt to  follow up in one month.  Orders: Prescription Created Electronically [phone])  Complete Medication List: 1)  Alprazolam 0.5 Mg Tabs (Alprazolam) .Marland Kitchen.. 1 tab by mouth two times a day. 2)  Simvastatin 80 Mg Tabs (Simvastatin) .... One tab by mouth at night 3)  Centrum Silver Tabs (Multiple vitamins-minerals) .Marland Kitchen.. 1 daily by mouth 4)  Aspirin 81 Mg Tbec (Aspirin) .... Take one tablet by mouth daily 5)  Vitamin B-12 2500 Mcg Subl (Cyanocobalamin) .... Take one daily 6)  Losartan Potassium-hctz 50-12.5 Mg Tabs (Losartan potassium-hctz) .Marland Kitchen.. 1 tab by mouth daily. 7)  Meclizine Hcl 25 Mg Tabs (Meclizine hcl) .... 1/2 to 1 tab by mouth q 6 hours as needed dizziness / vertigo 8)   Zyrtec Allergy 10 Mg Tabs (Cetirizine hcl) .Marland Kitchen.. 1 tab at bedtime 9)  Fluoxetine Hcl 20 Mg Tabs (Fluoxetine hcl) .... Take 1 tab by mouth daily  Patient Instructions: 1)  Please come back to see me in one month. 2)  We are cutting your blood pressure in dose in half, new prescription called to your pharmacy. Prescriptions: LOSARTAN POTASSIUM-HCTZ 50-12.5 MG TABS (LOSARTAN POTASSIUM-HCTZ) 1 tab by mouth daily.  #30 x 3   Entered and Authorized by:   [name] MD   Signed by:   [name] MD on 04/22/2010   Method used:   Electronically to        [facility] [phone][facility] (retail)       [address] [place], [place]  [address]       Ph: [phone] or [phone]       Fax: [phone]   RxID:   [id number] ALPRAZOLAM 0.5 MG  TABS (ALPRAZOLAM) 1 tab by mouth two times a day.  #30 x 0   Entered and Authorized by:   [name] MD   Signed by:   [name] MD on 04/22/2010   Method used:   Historical   RxID:   [id number] FLUOXETINE HCL 20 MG  TABS (FLUOXETINE HCL) Take 1 tab by mouth daily  #90 x 3   Entered and Authorized by:   [name] MD   Signed by:   [name] MD on 04/22/2010   Method used:   Electronically to        [facility] [phone]* (retail)       [address] [place], [place]  [address]       Ph: [phone] or [phone]       Fax: [phone]   RxID:   [id number]   Current Allergies (reviewed today): ! PENICILLIN V POTASSIUM ! CODEINE SULFATE

## 2010-08-27 NOTE — Assessment & Plan Note (Signed)
Summary: 10 DAY F/U AFTER LABS / LFW   Vital Signs:  Patient profile:   65 year old female Weight:      154 pounds Temp:     98.3 degrees F oral Pulse rate:   76 / minute Pulse rhythm:   regular BP sitting:   140 / 90  (left arm) Cuff size:   large  Vitals Entered By: [name] LPN (September 25, 8117 8:52 AM) CC: 10 Day follow-up after labs   History of Present Illness: Pt here for followup of BP after being taken off all BP meds in hosp for syncope unknown etiology and put back on liower dose last time , 1/2 Hyzaar 110/25. She has tolerated well and feels ok but her pressure is higher again today.  She also complains of right arm pain in the bicep through the lateral forearm region. AShe fell and hit this side with her syncope that got her to the [place].  Problems Prior to Update: 1)  Hypokalemia  (ICD-276.8) 2)  Arm Pain, Right  (ICD-729.5) 3)  Hx of Metabolic Syndrome X  (ICD-277.7) 4)  Deep Venous Thrombophlebitis, Hx of Bilat  (ICD-V12.52) 5)  Hypertension, Benign Essential  (ICD-401.1) 6)  Obesity, Morbid  (ICD-278.01) 7)  Hyperlipidemia, Mixed  (ICD-272.2) 8)  Abnormal Electrocardiogram, Prolonged Qt Segment  (ICD-794.31) 9)  Special Screening Malig Neoplasms Other Sites  (ICD-V76.49) 10)  Health Maintenance Exam  (ICD-V70.0) 11)  Other Screening Mammogram  (ICD-V76.12) 12)  Disorder, Depressive Nec  (ICD-311) 13)  Aodm  (ICD-250.00) 14)  Bell's Palsy, Left , Recurrent  (ICD-351.0) 15)  Postmenopausal On Hormone Replacement Therapy  (ICD-V07.4) 16)  Allergy  (ICD-995.3) 17)  Grief Reaction  (ICD-309.0) 18)  Need Prophylactic Vaccination&inoculation Flu  (ICD-V04.81)  Medications Prior to Update: 1)  Wellbutrin Xl 300 Mg  Tb24 (Bupropion Hcl) .... Take One By Mouth Every Am 2)  Alprazolam 0.5 Mg  Tabs (Alprazolam) .Marland Kitchen.. 1 Two Times A Day 3)  Simvastatin 80 Mg  Tabs (Simvastatin) .... One Tab By Mouth At Night 4)  Centrum Silver  Tabs (Multiple Vitamins-Minerals) .Marland Kitchen.. 1  Daily By Mouth 5)  Aspirin 81 Mg Tbec (Aspirin) .... Take One Tablet By Mouth Daily 6)  Vitamin B-12 1000 Mcg Tabs (Cyanocobalamin) .Marland Kitchen.. 1 Tab Once Daily 7)  Metaxalone 800 Mg Tabs (Metaxalone) .... Take One By Mouth Three Times A Day 8)  Prednisone 50 Mg Tabs (Prednisone) .... One Tab By Mouth in Am 9)  Vicodin 5-500 Mg Tabs (Hydrocodone-Acetaminophen) .... One Tab By Mouth Three Times A Day As Needed Pain 10)  Tylenol Extra Strength 500 Mg Tabs (Acetaminophen) .... As Needed 11)  Prednisone 10 Mg Tabs (Prednisone) .... 4 Tabs First Thing in The Am For 3 Days, Then Decrease By One Tab Every Three Days, Finally Taking 1/2 Tab For 6 Days.  Allergies: 1)  ! Penicillin V Potassium 2)  ! Codeine Sulfate  Physical Exam  General:  overweight female in NAD Head:  Normocephalic and atraumatic without obvious abnormalities. No apparent alopecia or balding. Sinuses NT. Eyes:  Conjunctiva clear bilaterally.  Ears:  External ear exam shows no significant lesions or deformities.  Otoscopic examination reveals clear canals, tympanic membranes are intact bilaterally without bulging, retraction, inflammation or discharge. Hearing is grossly normal bilaterally. Nose:  External nasal examination shows no deformity or inflammation. Nasal mucosa are pink and moist without lesions or exudates. Mouth:  Oral mucosa and oropharynx without lesions or exudates.  Teeth in good  repair. Neck:  no carotid bruit or thyromegaly no cervical or supraclavicular lymphadenopathy Right ttp over lateral neck, neg Spurling, decrease ROM due to pain Lungs:  Normal respiratory effort, chest expands symmetrically. Lungs are clear to auscultation, no crackles or wheezes. Heart:  Normal rate and regular rhythm. S1 and S2 normal without gallop, murmur, click, rub or other extra sounds. Extremities:  R arm min tender as described in HPI, no swelling and poss mild, fading echymosis.   Impression & Recommendations:  Problem # 1:   HYPERTENSION, BENIGN ESSENTIAL (ICD-401.1) Assessment Deteriorated  Incr to whole pill. Recheck lytes next time. Her updated medication list for this problem includes:    Hyzaar 100-25 Mg Tabs (Losartan potassium-hctz) .Marland Kitchen... Take 1  by mouth daily  BP today: 140/90 Prior BP: 134/84 (09/15/2009)  Labs Reviewed: K+: 4.5 (09/23/2009) Creat: : 0.9 (09/23/2009)   Chol: 167 (09/23/2009)   HDL: 86.50 (09/23/2009)   LDL: 59 (09/23/2009)   TG: 107.0 (09/23/2009)  Problem # 2:  ARM PAIN, RIGHT (ICD-729.5) Assessment: Unchanged Encouraged to continue regular ROM exercises.  Complete Medication List: 1)  Wellbutrin Xl 300 Mg Tb24 (Bupropion hcl) .... Take one by mouth every am 2)  Alprazolam 0.5 Mg Tabs (Alprazolam) .Marland Kitchen.. 1 two times a day 3)  Simvastatin 80 Mg Tabs (Simvastatin) .... One tab by mouth at night 4)  Centrum Silver Tabs (Multiple vitamins-minerals) .Marland Kitchen.. 1 daily by mouth 5)  Aspirin 81 Mg Tbec (Aspirin) .... Take one tablet by mouth daily 6)  Vitamin B-12 1000 Mcg Tabs (Cyanocobalamin) .Marland Kitchen.. 1 tab once daily 7)  Metaxalone 800 Mg Tabs (Metaxalone) .... Take one by mouth three times a day 8)  Tylenol Extra Strength 500 Mg Tabs (Acetaminophen) .... As needed 9)  Prednisone 10 Mg Tabs (Prednisone) .... 4 tabs first thing in the am for 3 days, then decrease by one tab every three days, finally taking 1/2 tab for 6 days. 10)  Hyzaar 100-25 Mg Tabs (Losartan potassium-hctz) .... Take 1  by mouth daily  Patient Instructions: 1)  RTC Mon for Comp Exam.  Current Allergies (reviewed today): ! PENICILLIN V POTASSIUM ! CODEINE SULFATE

## 2010-08-27 NOTE — Progress Notes (Signed)
Summary: ? Mobic  Phone Note Call from Patient Call back at [phone] or [phone]   Caller: Patient Call For: Dr. 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 They wanted her to call you and make sure that she can take Mobic 7.5 mg with the baby aspirin that she already takes.  You can leave a message at her home phone number if you can not reach her at work, she will be at work until 2:30 pm. Initial call taken by: [name],  November 14, 2007 11:55 AM  Follow-up for Phone Call        discussed with pt, can take until the end of the month with food. Let me know if will be longer. Follow-up by: Shaune Leeks MD,  November 14, 2007 7:07 PM

## 2010-08-27 NOTE — Consult Note (Signed)
Summary: [facility] - FOLLOW UP [facility] / DR. BENJAMIN HOX  [facility] - FOLLOW UP [facility] / DR. BENJAMIN HOXWORTH   Imported By: Carin Primrose 03/27/2008 16:25:33  _____________________________________________________________________  External Attachment:    Type:   Image     Comment:   External Document

## 2010-08-31 ENCOUNTER — Telehealth: Payer: Self-pay | Admitting: Family Medicine

## 2010-09-10 NOTE — Progress Notes (Signed)
Summary: alprazolam   Phone Note Refill Request Message from:  Patient on August 31, 2010 8:38 AM  Refills Requested: Medication #1:  ALPRAZOLAM 0.5 MG  TABS 1 tab by mouth two times a day. Patient is requesting rx to be sent to [facility].   Initial call taken by: Melody Comas,  August 31, 2010 8:38 AM  Follow-up for Phone Call        I do not send controlled substances through mail order. [name] MD  August 31, 2010 9:12 AM   Patient has been getting this through [facility] in the past, and it saves her alot of money.  Melody Comas  August 31, 2010 9:19 AM     Prescriptions: ALPRAZOLAM 0.5 MG  TABS (ALPRAZOLAM) 1 tab by mouth two times a day.  #60 x 0   Entered and Authorized by:   [name] MD   Signed by:   [name] MD on 08/31/2010   Method used:   Telephoned to ...       [facility]  [employer] [phone]* (retail)       [address] [place], [place]  [address]       Ph: [phone] or [phone]       Fax: [phone]   RxID:   [id number]   Appended Document: alprazolam  Rx called to pharmacy.   Appended Document: alprazolam  Spoke with patient and she is required to get this rx through [facility]. She is asking if she can get a written script to pick up and mail to [facility].   Appended Document: alprazolam  rx printed.  Appended Document: alprazolam  Left message on machine at home, Rx ready for pick up will be left at front desk.

## 2010-10-10 ENCOUNTER — Encounter: Payer: Self-pay | Admitting: Family Medicine

## 2010-10-10 LAB — FECAL OCCULT BLOOD, GUAIAC: Fecal Occult Blood: NEGATIVE

## 2010-10-10 LAB — HM PAP SMEAR

## 2010-10-10 LAB — HM MAMMOGRAPHY

## 2010-10-10 LAB — HM DIABETES FOOT EXAM

## 2010-10-14 LAB — CBC
HCT: 36.6 % (ref 36.0–46.0)
HCT: 40.2 % (ref 36.0–46.0)
Hemoglobin: 12.8 g/dL (ref 12.0–15.0)
Hemoglobin: 13.9 g/dL (ref 12.0–15.0)
MCHC: 34.7 g/dL (ref 30.0–36.0)
MCHC: 34.9 g/dL (ref 30.0–36.0)
MCV: 92.9 fL (ref 78.0–100.0)
MCV: 92.9 fL (ref 78.0–100.0)
Platelets: 188 10*3/uL (ref 150–400)
Platelets: 202 10*3/uL (ref 150–400)
RBC: 3.94 MIL/uL (ref 3.87–5.11)
RBC: 4.32 MIL/uL (ref 3.87–5.11)
RDW: 12.9 % (ref 11.5–15.5)
RDW: 12.9 % (ref 11.5–15.5)
WBC: 7.8 10*3/uL (ref 4.0–10.5)
WBC: 9.6 10*3/uL (ref 4.0–10.5)

## 2010-10-14 LAB — TSH: TSH: 1.298 u[IU]/mL (ref 0.350–4.500)

## 2010-10-14 LAB — COMPREHENSIVE METABOLIC PANEL
ALT: 16 U/L (ref 0–35)
AST: 19 U/L (ref 0–37)
Albumin: 3.7 g/dL (ref 3.5–5.2)
Alkaline Phosphatase: 53 U/L (ref 39–117)
BUN: 18 mg/dL (ref 6–23)
CO2: 24 mEq/L (ref 19–32)
Calcium: 9.4 mg/dL (ref 8.4–10.5)
Chloride: 104 mEq/L (ref 96–112)
Creatinine, Ser: 0.68 mg/dL (ref 0.4–1.2)
GFR calc Af Amer: >60 mL/min (ref >60)
GFR calc non Af Amer: >60 mL/min (ref >60)
Glucose, Bld: 100 mg/dL — ABNORMAL HIGH (ref 70–99)
Potassium: 3.7 mEq/L (ref 3.5–5.1)
Sodium: 137 mEq/L (ref 135–145)
Total Bilirubin: 0.5 mg/dL (ref 0.3–1.2)
Total Protein: 6.3 g/dL (ref 6.0–8.3)

## 2010-10-14 LAB — URINALYSIS, ROUTINE W REFLEX MICROSCOPIC
Bilirubin Urine: NEGATIVE
Glucose, UA: NEGATIVE mg/dL
Hgb urine dipstick: NEGATIVE
Ketones, ur: NEGATIVE mg/dL
Nitrite: NEGATIVE
Protein, ur: NEGATIVE mg/dL
Specific Gravity, Urine: 1.02 (ref 1.005–1.030)
Urobilinogen, UA: 0.2 mg/dL (ref 0.0–1.0)
pH: 7.5 (ref 5.0–8.0)

## 2010-10-14 LAB — DIFFERENTIAL
Basophils Absolute: 0 10*3/uL (ref 0.0–0.1)
Basophils Absolute: 0.1 10*3/uL (ref 0.0–0.1)
Basophils Relative: 1 % (ref 0–1)
Basophils Relative: 1 % (ref 0–1)
Eosinophils Absolute: 0.2 10*3/uL (ref 0.0–0.7)
Eosinophils Absolute: 0.3 10*3/uL (ref 0.0–0.7)
Eosinophils Relative: 2 % (ref 0–5)
Eosinophils Relative: 4 % (ref 0–5)
Lymphocytes Relative: 14 % (ref 12–46)
Lymphocytes Relative: 25 % (ref 12–46)
Lymphs Abs: 1.3 10*3/uL (ref 0.7–4.0)
Lymphs Abs: 2 10*3/uL (ref 0.7–4.0)
Monocytes Absolute: 0.7 10*3/uL (ref 0.1–1.0)
Monocytes Absolute: 0.8 10*3/uL (ref 0.1–1.0)
Monocytes Relative: 10 % (ref 3–12)
Monocytes Relative: 9 % (ref 3–12)
Neutro Abs: 4.8 10*3/uL (ref 1.7–7.7)
Neutro Abs: 7.2 10*3/uL (ref 1.7–7.7)
Neutrophils Relative %: 61 % (ref 43–77)
Neutrophils Relative %: 75 % (ref 43–77)

## 2010-10-14 LAB — BASIC METABOLIC PANEL
BUN: 12 mg/dL (ref 6–23)
CO2: 27 mEq/L (ref 19–32)
Calcium: 9.3 mg/dL (ref 8.4–10.5)
Chloride: 107 mEq/L (ref 96–112)
Creatinine, Ser: 0.65 mg/dL (ref 0.4–1.2)
GFR calc Af Amer: >60 mL/min (ref >60)
GFR calc non Af Amer: >60 mL/min (ref >60)
Glucose, Bld: 88 mg/dL (ref 70–99)
Potassium: 3.3 mEq/L — ABNORMAL LOW (ref 3.5–5.1)
Sodium: 141 mEq/L (ref 135–145)

## 2010-10-14 LAB — POCT CARDIAC MARKERS
CKMB, poc: 1 ng/mL (ref 1.0–8.0)
CKMB, poc: <1 ng/mL — ABNORMAL LOW (ref 1.0–8.0)
CKMB, poc: <1 ng/mL — ABNORMAL LOW (ref 1.0–8.0)
Myoglobin, poc: 101 ng/mL (ref 12–200)
Myoglobin, poc: 80.8 ng/mL (ref 12–200)
Myoglobin, poc: 82.4 ng/mL (ref 12–200)
Troponin i, poc: <0.05 ng/mL (ref 0.00–0.09)
Troponin i, poc: <0.05 ng/mL (ref 0.00–0.09)
Troponin i, poc: <0.05 ng/mL (ref 0.00–0.09)

## 2010-10-14 LAB — CARDIAC PANEL(CRET KIN+CKTOT+MB+TROPI)
CK, MB: 1.1 ng/mL (ref 0.3–4.0)
CK, MB: 1.5 ng/mL (ref 0.3–4.0)
Relative Index: INVALID (ref 0.0–2.5)
Relative Index: INVALID (ref 0.0–2.5)
Total CK: 60 U/L (ref 7–177)
Total CK: 85 U/L (ref 7–177)
Troponin I: 0.01 ng/mL (ref 0.00–0.06)
Troponin I: 0.02 ng/mL (ref 0.00–0.06)

## 2010-10-14 LAB — CK TOTAL AND CKMB (NOT AT ARMC)
CK, MB: 1.3 ng/mL (ref 0.3–4.0)
Relative Index: INVALID (ref 0.0–2.5)
Total CK: 61 U/L (ref 7–177)

## 2010-10-14 LAB — TROPONIN I: Troponin I: 0.02 ng/mL (ref 0.00–0.06)

## 2010-11-13 ENCOUNTER — Other Ambulatory Visit: Payer: Self-pay | Admitting: Family Medicine

## 2010-11-13 DIAGNOSIS — Z1231 Encounter for screening mammogram for malignant neoplasm of breast: Secondary | ICD-10-CM

## 2010-11-24 ENCOUNTER — Encounter: Payer: Self-pay | Admitting: Family Medicine

## 2010-11-24 ENCOUNTER — Ambulatory Visit: Payer: Self-pay | Admitting: Family Medicine

## 2010-11-24 ENCOUNTER — Ambulatory Visit (INDEPENDENT_AMBULATORY_CARE_PROVIDER_SITE_OTHER): Payer: 59 | Admitting: Family Medicine

## 2010-11-24 VITALS — BP 102/78 | HR 82 | Temp 98.3°F | Ht 62.0 in | Wt 160.4 lb

## 2010-11-24 DIAGNOSIS — F3289 Other specified depressive episodes: Secondary | ICD-10-CM

## 2010-11-24 DIAGNOSIS — R5381 Other malaise: Secondary | ICD-10-CM

## 2010-11-24 DIAGNOSIS — F329 Major depressive disorder, single episode, unspecified: Secondary | ICD-10-CM

## 2010-11-24 LAB — CBC WITH DIFFERENTIAL/PLATELET
Basophils Absolute: 0 10*3/uL (ref 0.0–0.1)
Basophils Relative: 0.4 % (ref 0.0–3.0)
Eosinophils Absolute: 0.2 10*3/uL (ref 0.0–0.7)
Eosinophils Relative: 2.3 % (ref 0.0–5.0)
HCT: 35.6 % — ABNORMAL LOW (ref 36.0–46.0)
Hemoglobin: 12.3 g/dL (ref 12.0–15.0)
Lymphocytes Relative: 19.6 % (ref 12.0–46.0)
Lymphs Abs: 1.7 10*3/uL (ref 0.7–4.0)
MCHC: 34.5 g/dL (ref 30.0–36.0)
MCV: 92.5 fl (ref 78.0–100.0)
Monocytes Absolute: 0.7 10*3/uL (ref 0.1–1.0)
Monocytes Relative: 8.3 % (ref 3.0–12.0)
Neutro Abs: 5.9 10*3/uL (ref 1.4–7.7)
Neutrophils Relative %: 69.4 % (ref 43.0–77.0)
Platelets: 221 10*3/uL (ref 150.0–400.0)
RBC: 3.85 Mil/uL — ABNORMAL LOW (ref 3.87–5.11)
RDW: 13.1 % (ref 11.5–14.6)
WBC: 8.5 10*3/uL (ref 4.5–10.5)

## 2010-11-24 LAB — BASIC METABOLIC PANEL
BUN: 17 mg/dL (ref 6–23)
CO2: 29 mEq/L (ref 19–32)
Calcium: 9.6 mg/dL (ref 8.4–10.5)
Chloride: 101 mEq/L (ref 96–112)
Creatinine, Ser: 0.6 mg/dL (ref 0.4–1.2)
GFR: 98.7 mL/min (ref >60.00)
Glucose, Bld: 89 mg/dL (ref 70–99)
Potassium: 4.5 mEq/L (ref 3.5–5.1)
Sodium: 139 mEq/L (ref 135–145)

## 2010-11-24 LAB — TSH: TSH: 1.65 u[IU]/mL (ref 0.35–5.50)

## 2010-11-24 NOTE — Assessment & Plan Note (Signed)
Deteriorated and likely multifactorial. ? Depression is playing a role. Will check labs today to rule out other causes. Orders Placed This Encounter  Procedures  . Basic Metabolic Panel (BMET)  . CBC w/Diff  . TSH  . Vitamin D (25 hydroxy)

## 2010-11-24 NOTE — Assessment & Plan Note (Signed)
Deteriorated. Discussed psychotherapy and she is willing to try a therapist that I suggest. Given name of Dr. Sherrine Maples, daughter will call.

## 2010-11-24 NOTE — Patient Instructions (Signed)
Dr. Lorenda Cahill [phone] [address].

## 2010-11-24 NOTE — Progress Notes (Signed)
65 yo here for fatigue.  Has been extensively worked up in past due to associated episodes of syncope.  No recurrent episodes of syncope.   Most recently was in 08/2009- complete TIA and cardiac workup that was negative at [facility], including neg dopplers, MRI, 2 decho.  CBC, B12/folate, TSH all within normal limits in 03/2010.  Daughter reports worsening fatigue for past 2 months. No CP, SOB or LE edema.    Depression- depressed since her husband died in 12/15/06.  Pt reports improved symptoms on Fluoxetine but her daughter says she is still tearful and often "Mopey." No SI or HI. Never received psychotherapy.  The PMH, PSH, Social History, Family History, Medications, and allergies have been reviewed in [facility], and have been updated if relevant.   Review of Systems       See HPI General:  Denies fever. Eyes:  Denies blurring. ENT:  Denies difficulty swallowing. CV:  Denies chest pain or discomfort. Resp:  Denies shortness of breath. GI:  Denies abdominal pain and change in bowel habits. MS:  Denies joint pain, joint redness, and joint swelling. Neuro:  Denies falling down, headaches, inability to speak, poor balance, seizures, sensation of room spinning, visual disturbances, and weakness.  Physical Exam BP 102/78  Pulse 82  Temp(Src) 98.3 F (36.8 C) (Oral)  Ht 5\' 2"  (1.575 m)  Wt 160 lb 6.4 oz (72.757 kg)  BMI 29.34 kg/m2  General:  Well-developed,well-nourished,in no acute distress; alert,appropriate and cooperative throughout examination, minimally overweight. Mouth:  Oral mucosa and oropharynx without lesions or exudates.  Teeth in good repair. Neck:  No deformities, masses, or tenderness noted. No bruits Lungs:  Normal respiratory effort, chest expands symmetrically. Lungs are clear to auscultation, no crackles or wheezes. Heart:  Normal rate and regular rhythm. S1 and S2 normal without gallop, murmur, click, rub or other extra sounds. Abdomen:  Bowel sounds positive,abdomen  soft and non-tender without masses, organomegaly or hernias noted. Extremities:  No clubbing, cyanosis, edema, or deformity noted with normal full range of motion of all joints.   Neurologic:  No cranial nerve deficits noted. Station and gait are normal. Sensory, motor and coordinative functions appear intact. Psych:  Cognition and judgment appear intact. Alert and cooperative with normal attention span and concentration. No apparent delusions, illusions, hallucinations

## 2010-11-25 LAB — VITAMIN D 25 HYDROXY (VIT D DEFICIENCY, FRACTURES): Vit D, 25-Hydroxy: 55 ng/mL (ref 30–89)

## 2010-12-08 NOTE — Op Note (Signed)
[name], [name]              ACCOUNT NO.:  [id number]   MEDICAL RECORD NO.:  [id number]          PATIENT TYPE:  AMB   LOCATION:  DAY                          FACILITY:  [facility]   PHYSICIAN:  Sharlet Salina T. Hoxworth, M.D.DATE OF BIRTH:  [birth year]   DATE OF PROCEDURE:  02/06/2007  DATE OF DISCHARGE:                               OPERATIVE REPORT   PREOPERATIVE DIAGNOSIS:  Morbid obesity.   POSTOPERATIVE DIAGNOSIS:  Morbid obesity.   SURGICAL PROCEDURES:  Placement of laparoscopic adjustable gastric band.   SURGEON:  Lorne Skeens. Hoxworth, M.D.   ASSISTANT:  Luretha Murphy, M.D.   ANESTHESIA:  General.   BRIEF HISTORY:  [name] is a 65 year old female with morbid  obesity unresponsive medical management and multiple comorbidities  including diabetes and hypertension.  After extensive preoperative  education and workup, she has elected to proceed with laparoscopic  adjustable gastric band placement.  The nature of the procedure and  risks have been discussed extensively as detailed elsewhere.  She is now  brought to the operating room for this procedure.   DESCRIPTION OF OPERATION:  The patient was brought to the operating room  and placed in a supine position on the operating table and general  orotracheal anesthesia was induced.  She received preoperative  antibiotics.  PAS were in place.  Lovenox was given subcutaneously  preoperatively.  The abdomen was widely sterilely prepped and draped.  Correct patient and procedure were verified.  Local anesthesia was used  to infiltrate the trocar sites prior to the incisions.  A 1-cm incision  was made in the left subcostal space and abdominal access obtained  without difficulty with the Optiview trocar and pneumoperitoneum  established.  Under direct vision, a 15-mm trocar was placed in the  right paraxiphoid space through the falciform ligament, an 11-mm trocar  in the right midabdomen and another 11-mm trocar just to left of  the  umbilicus for a camera port.  Through a 5-mm subxiphoid site, the [facility]  retractor was placed with elevation of the left lobe liver and excellent  exposure of the upper stomach and hiatus.  An additional 5-mm port was  placed in the left flank.  The angle of His was exposed and peritoneum  incised over the left crus and blunt dissection carried back toward the  retrogastric space.  Careful inspection of the hiatus at this point did  now show any evidence of a significant hiatal hernia.  The pars flaccida  was opened and the base of the right crus identified.  Dissecting just  anterior to this, the finger retractor was able to be passed posterior  to the stomach without difficulty and deployed up through the previously  dissected area over the left crus at the angle of His without  difficulty.  An APS lap band system was then inserted into the abdomen  and the tubing passed through the finger retractor, which was then  brought back behind the stomach and the band brought back around behind  the stomach without difficulty.  The calibration tube was placed into  the stomach and then the band  buckled over the calibration tube, which  was then withdrawn.  The fundus near the angle of His was then  imbricated up over the band to the small gastric pouch with 3  interrupted 2-0 Dermabond sutures.  The band appeared to be in very good  position.  The abdomen was inspected for hemostasis, which appeared  complete and there was no evidence of trocar injury.  The tubing was  brought out through the right mid abdominal port site and then the  [employer] [facility] tractor was removed, all CO2 evacuated and trocars removed.  This  incision was extended laterally slightly and the anterior fascia  exposed.  Four 2-0 Prolene sutures were placed in the appropriate  position on the anterior fascia.  The tubing was cut and attached to the  port, which was sutured to the anterior chest wall with the previously-   placed sutures.  The tubing was seen to curve smoothly into the  abdominal cavity through the trocar site.  This site was irrigated and  hemostasis assured.  The subcu was closed with running 2-0 Vicryl.  Skin  incisions were closed with staples.  Sponge, needle and instrument  counts were correct.  Dry sterile dressings were applied and the patient  taken to Recovery in good condition.      Lorne Skeens. Hoxworth, M.D.  Electronically Signed     BTH/MEDQ  D:  02/06/2007  T:  02/07/2007  Job:  [id number]

## 2010-12-08 NOTE — Assessment & Plan Note (Signed)
[facility]                            CARDIOLOGY OFFICE NOTE   NAME:[name], [name]                     MRN:          [id number]  DATE:01/04/2008                            DOB:          10/21/1945    [name] comes in today for followup of her metabolic syndrome.  Since I  last saw her, she has had a lap band with Dr. Johna [name] and has lost 75  pounds!  She said her blood sugars average about 100 or less, her lipids  were good when checked last by Dr. Hetty [name], and her blood pressure has  been better controlled.  She has no ischemic symptoms or angina.  She  has had a negative stress Myoview January 10, 2006 with normal left  ventricular function.   She denies any orthopnea, PND or peripheral edema.   CURRENT MEDICATIONS:  1. Aspirin 80 mg daily.  2. Multivitamin.  3. Zyrtec.  4. Hyzaar 100/25 daily.  5. Wellbutrin XL 300 mg daily.  6. Simvastatin 80 mg nightly.  7. Buspirone 10 mg b.i.d.  8. Metformin 500 mg nightly.  9. Alprazolam 0.5 b.i.d.   EXAMINATION:  Her blood pressure is 148/86, her pulse is 74 and regular.  She weighs 150 pounds, down from 212.  HEENT:  Her face has thinned dramatically.  PERRLA.  Extraocular  movements intact.  Sclerae clear.  Facial symmetry is normal.  Carotid  upstrokes are equal bilaterally without bruits.  No JVD.  Thyroid is not  enlarged.  Trachea is midline.  LUNGS:  Clear.  HEART:  Reveals a regular rate and rhythm.  No gallop.  ABDOMEN:  Soft, good bowel sounds.  No midline bruit.  EXTREMITIES:  No cyanosis, clubbing or edema.  Pulses are brisk.  NEURO:  Exam is intact.  No sign of DVT.  MUSCULOSKELETAL:  Chronic arthritic changes.   Her electrocardiogram demonstrates sinus rhythm with a left axis and a  RSR prime in V1 and V2.  There has been no change.   ASSESSMENT/PLAN:  I am delighted that [name] has lost all this  weight.  Her metabolic parameters have dramatically improved.  She has  done  this all the while, while losing her husband last summer.  She has  a lot of family support.  I will plan on seeing her back in a year.     Thomas C. Daleen Squibb, MD, [facility]  Electronically Signed    TCW/MedQ  DD: 01/04/2008  DT: 01/04/2008  Job #: [id number]   cc:   Arta Silence, MD

## 2010-12-08 NOTE — Assessment & Plan Note (Signed)
[facility]                            CARDIOLOGY OFFICE NOTE   NAME:Holleran, NECOLE MINASSIAN                     MRN:          [id number]  DATE:01/02/2007                            DOB:          Nov 30, 1945    Ms. Chatterjee comes in today for further management of her metabolic  syndrome and multiple cardiac risk factors.   She is having no ischemic symptoms or angina equivalency. Stress Myoview  January 10, 2006 showed an EF of 64% with no sign of scar or ischemia. She  had normal contractility of all segments of the myocardium. She is to  undergo a gastric banding procedure with Dr. Glenna Fellows on July  14. Her weight is actually down about 11 pounds since I last saw her in  June 2007.   She denies any orthopnea, PND, peripheral edema.   MEDICATIONS:  1. Avandia 8 mg a day.  2. Aspirin 81 mg a day.  3. Ambien 10 mg p.r.n.  4. Multivitamin.  5. Zyrtec 10 mg q.h.s.  6. Buspirone 10 mg daily.  7. Flomax 0.4 q.h.s.  8. Allegra 180 mg daily.  9. Hyzaar 100/25 daily.  10.Metformin 500 mg q.a.m.  11.Metformin 1000 mg q.p.m.  12.Caduet 5/40 daily.  13.Nabumetone 500 mg p.o. b.i.d.  14.Wellbutrin XL 300 mg a day.   PHYSICAL EXAMINATION:  GENERAL:  She is in no acute distress.  VITAL SIGNS:  Her blood pressure is 154/78, pulse is 97 and regular, her  weight is 212.  HEENT:  Normocephalic, atraumatic. PERRLA. Extraocular movements intact.  Sclera clear. Facial symmetry is normal.  NECK:  Carotid upstrokes equal bilaterally without bruits. There is no  JVD. Thyroid is not enlarged.  LUNGS:  Clear.  HEART:  Reveals a regular rate and rhythm. PMI is poorly appreciated.  There is no gallop.  ABDOMEN:  Protuberant, good bowel sounds. Organomegaly could not be  assessed.  EXTREMITIES:  Reveal no cyanosis or clubbing. There is 1+ edema. Pulses  are intact.  NEUROLOGIC:  Intact.   ASSESSMENT/PLAN:  Ms. Shatz is stable from a cardiovascular  standpoint. I have cleared her for surgery. I think this is the right  decision.   I have recommended she stop her Avandia and go to Actos 30 mg a day. She  will followup with Dr. Hetty Ely concerning risk factor modification. I  will see her back in a year or p.r.n.     Thomas C. Daleen Squibb, MD, [facility]  Electronically Signed    TCW/MedQ  DD: 01/02/2007  DT: 01/02/2007  Job #: [id number]   cc:   Sharlet Salina T. Hoxworth, M.D.  Arta Silence, MD

## 2010-12-09 ENCOUNTER — Ambulatory Visit (INDEPENDENT_AMBULATORY_CARE_PROVIDER_SITE_OTHER): Payer: 59 | Admitting: Psychology

## 2010-12-09 DIAGNOSIS — F4323 Adjustment disorder with mixed anxiety and depressed mood: Secondary | ICD-10-CM

## 2010-12-11 NOTE — Discharge Summary (Signed)
[name], [name]              ACCOUNT NO.:  [id number]   MEDICAL RECORD NO.:  [id number]          PATIENT TYPE:  OIB   LOCATION:  2899                         FACILITY:  [facility]   PHYSICIAN:  Doylene Canning. Ladona Ridgel, M.D.  DATE OF BIRTH:  January 14, 1946   DATE OF ADMISSION:  01/13/2005  DATE OF DISCHARGE:  01/13/2005                                 DISCHARGE SUMMARY   DISCHARGE DIAGNOSES:  1.  Syncope two years ago with unclear etiology causing bilateral elbow      fractures, no recurrence.  2.  Status post implantable loop recorder, August of 2004 with no tachybrady      episodes recorded.   SECONDARY DIAGNOSES:  1.  Diabetes mellitus.  2.  Dyslipidemia.  3.  Deep venous thrombosis on Coumadin, recently discontinued Coumadin.  4.  Metabolic syndrome.  5.  Hypertension.  6.  Obesity.   PROCEDURE:  On January 13, 2005, explantation of implantable loop recorder by  Dr. Lewayne Bunting.  The patient tolerated the procedure well and had no post  procedural complications, discharging the same day of January 13, 2005.  She  goes home on the following medications:   DISCHARGE MEDICATIONS:  1.  Avandia 8 mg daily.  2.  Tricor 160 mg daily.  3.  Aspirin 81 mg daily.  4.  Caduet 5/10 one tablet bedtime.  5.  Metformin 1.25/250 b.i.d.  6.  Zyrtec 10 mg at bedtime.  7.  Buspirone/hydrochloride 10 mg daily.  8.  Flomax 0.4 mg at bedtime.  9.  Allegra 180 mg daily.  10. Hyzaar 100/25 daily.  11. Cyclobenzaprine 10 mg one-half tablet twice daily.   BRIEF HISTORY:  [name] is a 65 year old female who has metabolic  syndrome.  She is managed for this by both Dr. Elpidio Eric and Dr.  Valera Castle.  She has unexplained syncope several years ago, I think it was  in 2004.  At that time, she fractured both elbows and had a deep venous  thrombosis.  She has been on Coumadin ever since that time but this has been  discontinued recently in June of 2006.  An implantable loop recorder was  placed in August  of 2004.  There have been, once again, no tachy or brady  episodes recorded.  The patient desires to have the recorder removed and  this is a viable option.  The patient will present on January 13, 2005 for  elective explanation by Dr. Lewayne Bunting.   HOSPITAL COURSE:  The patient presented January 13, 2005.  She underwent  explantation of her loop recorder without difficulty.  She will go home with  her pre hospital medications and with Tylenol as she is experiencing pain at  the explantation site, one to two tablets every four to six hours as needed  for pain.  Also, she is asked to keep her incision dry in the next four days  and sponge bathe until Sunday, January 17, 2005.      Debbora Lacrosse   GM/MEDQ  D:  01/13/2005  T:  01/13/2005  Job:  [id number]   cc:  Thomas C. Wall, M.D.   Elpidio Eric, M.D.

## 2010-12-11 NOTE — Op Note (Signed)
[name], [name]              ACCOUNT NO.:  [id number]   MEDICAL RECORD NO.:  [id number]          PATIENT TYPE:  AMB   LOCATION:  DAY                          FACILITY:  [facility]   PHYSICIAN:  Sigmund I. Patsi Sears, M.D.DATE OF BIRTH:  09-Sep-1945   DATE OF PROCEDURE:  09/10/2005  DATE OF DISCHARGE:                                 OPERATIVE REPORT   PREOP DIAGNOSIS:  Right lower ureteral stone.   POSTOP DIAGNOSIS:  Right lower ureteral stone.   OPERATION:  1.  Cystourethroscopy.  2.  Right retrograde pyelogram with interpretation.  3.  Dilation of right ureter.  4.  Right ureteroscopy.  5.  Basket extraction of right ureteral stone.  6.  Insertion of right double-J catheter (24 cm x 6.0 [place] double J).   SURGEON:  Sigmund I. Patsi Sears, M.D.   ANESTHESIA:  General LMA.   PREPARATION:  After appropriate preanesthesia, the patient was brought to  the operating room, placed on the operating room in the dorsal supine  position where general LMA anesthesia was introduced.  The pubis was prepped  with Betadine solution and draped in the usual fashion.   REVIEW OF HISTORY:  This 65 year old female, has a history of acute onset of  right flank and right lower quadrant pain, nausea, vomiting, kidney colic,  with CT scan findings of a 5.5-mm, right, lower ureteral calculus,  approximately 4-cm proximal to the right ureteral orifice. She was treated  with medical therapy with Flomax, and IM Toradol, but did not pass the  stone; and is now for basket extraction of stone.   DESCRIPTION OF PROCEDURE:  Cystourethroscopy was accomplished and right  retrograde pyelogram revealed a 5 mm right lower ureteral calculus. The  ureter was dilated in standard fashion with a sheath, and following this,  right ureteroscopy was accomplished, and found much blood and clots within  the ureter. There were multiple tiny stone fragments identified, and a  rather large stone identified; and this was  basket-extracted in 2 pieces.  Following this, a right double-J catheter, measuring 6.0 French x 24 cm was  passed into the renal pelvis and then the bladder. The bladder was drained  of fluid, the patient was given a B&O suppository, IV Toradol, and awakened  and taken to recovery room in good condition.     Sigmund I. Patsi Sears, M.D.  Electronically Signed    SIT/MEDQ  D:  09/10/2005  T:  09/10/2005  Job:  [id number]

## 2010-12-11 NOTE — Op Note (Signed)
   NAME:  [name], [name]                        ACCOUNT NO.:  [id number]   MEDICAL RECORD NO.:  [id number]                   PATIENT TYPE:  AMB   LOCATION:  DSC                                  FACILITY:  [facility]   PHYSICIAN:  Mila Homer. Sherlean Foot, M.D.              DATE OF BIRTH:  09-17-1945   DATE OF PROCEDURE:  12/12/2002  DATE OF DISCHARGE:                                 OPERATIVE REPORT   SURGEON:  Mila Homer. Sherlean Foot, M.D.   ASSISTANT:  None.   PREOPERATIVE DIAGNOSIS:  Right knee lateral meniscus tear.   POSTOPERATIVE DIAGNOSIS:  Right knee lateral meniscus tear and  osteoarthritis of the lateral patellofemoral joints.   INDICATIONS FOR PROCEDURE:  The patient is a 65 year old status post a  trauma and mechanical symptoms and MRI evidence of a lateral meniscus tear  of the bucket handle variety.  Informed consent was obtained.   DESCRIPTION OF PROCEDURE:  The patient was taken to the operating room and  administered knee block in preanesthesia holding and IV sedation in the OR.  The right lower extremity was prepped and draped in the usual sterile  fashion.  Inferolateral and inferomedial portals were created with a #11  blade, blunt trocar, and cannula.  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  In the  medial compartment, things looked very nice with no meniscus tears and very  little chondromalacia at all.  In the figure-four position, I went into the  lateral compartment only to find that my view was blocked by a bucket handle  tear of the lateral meniscus.  It was through the white-white zone and  incorporated the entire meniscus through the popliteus hiatus.  Once I had  reduced it, I realized that it needed to be excised rather than repaired,  that it would not heal, so I transected it anteriorly, pulled it out and  transected it posteriorly, and then debrided it back to a stable rim of  tissue with a shaver  and ArthroCare debridement wand.  I then took one  further tour through the knee and made sure there was no loose bodies. I  further debrided the patella in extension and then removed the fluid and  instruments, closed with interrupted 4-0 nylon sutures, dressed with  Xeroform dressing, sponges, sterile Webril, and Ace wrap.  Tourniquet was  none, complications were none, drains were none.  Estimated blood loss was  minimal.                                               Mila Homer. Sherlean Foot, M.D.    SDL/MEDQ  D:  12/12/2002  T:  12/12/2002  Job:  [id number]

## 2010-12-11 NOTE — Consult Note (Signed)
NAME:  [name], [name]                        ACCOUNT NO.:  [id number]   MEDICAL RECORD NO.:  [id number]                   PATIENT TYPE:  INP   LOCATION:  3704                                 FACILITY:  [facility]   PHYSICIAN:  Veneda Melter, M.D.                   DATE OF BIRTH:  01/25/1946   DATE OF CONSULTATION:  02/11/2003  DATE OF DISCHARGE:                                   CONSULTATION   SUMMARY OF HISTORY:  [name] is a 65 year old white female who was  admitted through the [facility] Emergency Room secondary to syncope.  We  were asked to consult secondary to cardiac evaluation for syncope.  Ms.  [name] stated that yesterday morning at approximately 10 a.m. she went  outside to feed her cats.  After she fed her cats and emptied the trash, she  suddenly found herself on the ground complaining of bilateral elbow  discomfort, nose bleeding, and facial pain.  She denied any pre or post  dizziness, palpitations, chest discomfort, shortness of breath, nausea,  vomiting, diaphoresis, seizure activity, and loss of urine or bowel.  She  stated that she was able to crawl to the door and ring the doorbell.  Her  daughter assisted her into the house into bed, cleaned her up, and then  drove her to the emergency room for evaluation.  This was episode was  unwitnessed.  [name] thinks that she was probably unconscious only for  a few seconds.  She states that she does not remember hitting the ground.  She does admit to a prior occurrence which occurred in April of 2004 while  she was vacationing in [place], [place].  During this episode, she stated  that she was walking down the street with her 63-year-old grand-daughter and  suddenly found herself on the pavement and then being assisted up by  bystanders.  She had injured her right knee at that time and had surgery the  following month.  In the interim, she has not had any further syncopal  episodes.  She feels that her activity is  limited because of her right knee.  She has not had any problems with chest discomfort, shortness of breath,  nausea, vomiting, diaphoresis, or claudication.   ALLERGIES:  1. PENICILLIN.  2. CODEINE.   PAST MEDICAL HISTORY:  She has had a diagnosis of hypertension, noninsulin-  dependent diabetes, and hyperlipidemia for approximately for past five  years.  She does not have any complications associated with diabetes.  She  states that she has had Bell's palsy twice.  She has also undergo a cardiac  catheterization several years ago, which was reported to be normal, and a  stress Cardiolite last year.  However, both of these results are pending at  the time of this dictation.  She denies any history of myocardial  infarction, CVA, COPD, bleeding dyscrasias, or thyroid dysfunction.  PAST SURGICAL HISTORY:  1. Left cataract repair.  2. Right knee surgery secondary to meniscus tear in May of 2004.  3. Status post hysterectomy in the 1980s.  4. Bladder tack.  5. T&A.  6. Sinus surgery.   SOCIAL HISTORY:  She resides in [place], [place], with her  husband.  She has one son, one daughter, and two grandchildren who are alive  and well.  She denies any tobacco, alcohol, and herbal medication usage.  She does not check her sugars on a routine basis nor does she follow a  regular diet.  She is employed at [employer].  She also describes  problems with nerves and anxiety.  Particularly, she states that she  worries about everything and does not feel comfortable when she is riding in  a car.   FAMILY HISTORY:  Notable for the death of her mother at the age of 44 with  natural causes and her father at the age of 92 with his first MI.  He had  a history of COPD/TB.  She has one sister who is alive and well with a  history of diabetes, however, she has had continuing problems stemming from  a brown recluse spider bite.   REVIEW OF SYSTEMS:  Notable for epistaxis  associated with her syncopal  episode, glasses which are broken at this time, upper dentures, syncope,  menopause, anxiety, and pain in both elbows and right knee.  She denies any  problems with orthostatic dizziness or with turning of the head or inner ear  problems.   CURRENT MEDICATIONS:  Medications in the hospital include:  1. TriCor 160 mg daily.  2. Avapro 300 mg daily.  3. HCTZ 12.5 mg daily.  4. Pravachol 20 mg daily.  5. Avandia 8 mg daily.  6. Lovenox 40 mg daily.  7. Sliding scale insulin coverage.   LABORATORY DATA:  Since she has been in the hospital, x-rays on February 10, 2003, showed left and right radial fractures and probable tibial fracture  and her head CT was negative.  The EKG shows normal sinus rhythm, artifact  in regards to the baseline, and normal intervals.  The hemoglobin was 11.9,  hematocrit 34.3, normal indices, platelets 297, and WBC 6.6.  Sodium 137,  potassium 307, BUN 17, creatinine 0.8, glucose 231.  Normal LFTs.  ESR 17.   PHYSICAL EXAMINATION:  GENERAL APPEARANCE:  A pleasant, obese, white female  in no apparent distress.  However, she appeared uncomfortable while lying in  the bed.  HEENT:  Notable for right orbital ecchymosis and edema.  She also has an  abrasion to the mid left forehead associated with some edema.  NECK:  Supple without thyromegaly, adenopathy, JVD, or bruits.  CHEST:  Symmetrical excursion.  LUNGS:  Clear to auscultation anterolaterally.  HEART:  Regular rate and rhythm.  Normal S1 and S2.  No murmurs, rubs,  clicks, or gallops.  SKIN:  Bruising as noted.  ABDOMEN:  Unremarkable.  EXTREMITIES:  The bilateral upper extremities have some trace edema.  The  patient was complaining that she felt that her bandages were tight.  NEUROLOGIC:  Alert and oriented x 3.  Cranial nerves II-XII were grossly  intact.   IMPRESSION:  1. Syncope of undetermined etiology resulting in bilateral radial fractures    and possible right tibial  fracture and bruising to the face.  2. Hypertension on presentation to the emergency room and persists probably     because she is  not on all of her home medications.  3. Anxiety.  4. Normocytic anemia.  5. By report, negative stress Cardiolites and catheterizations in the past.   PLAN:  There has not been any evidence of hypoglycemia.  We will check a  portable chest x-ray, as well as a PTT, PT, CK total, MBs, and troponins.  We are aware that her CK total will probably be elevated secondary to a  fall, however, MB and troponin portions should be within normal limits.  We  agree with checking orthostasis.  Given her negative cardiac workup in the  past, we are also going to schedule a cardiac catheterization for further  evaluation for the possibility of coronary artery disease.  We will also  order carotid Dopplers.  Consideration should be given to tilt table,  possible event monitor, and maybe even a loop recorder.  She will need to be  on driving restrictions for at least six months with her unexplained  syncopal episodes.  Consider neurology involvement as to a neurological  etiology with a history of Bell's palsy.     Joellyn Rued, P.A. [facility]                    Veneda Melter, M.D.    EW/MEDQ  D:  02/11/2003  T:  02/11/2003  Job:  [id number]   cc:   Laurita Quint, M.D.  [address] [place]  [place] [address]  Fax: [phone]   Jesse Sans. Wall, M.D.

## 2010-12-11 NOTE — Op Note (Signed)
   NAMETRENELL, [name]                        ACCOUNT NO.:  [id number]   MEDICAL RECORD NO.:  [id number]                   PATIENT TYPE:  OIB   LOCATION:  2855                                 FACILITY:  [facility]   PHYSICIAN:  Doylene Canning. Ladona Ridgel, M.D.               DATE OF BIRTH:  [birth year]   DATE OF PROCEDURE:  02/27/2003  DATE OF DISCHARGE:                                 OPERATIVE REPORT   PROCEDURE PERFORMED:  Insertion of implantable loop recorder.   INDICATIONS:  Unexplained syncope.   INTRODUCTION:  The patient is a 65 year old woman with a history of normal  LV systolic function and narrow coronary artery disease, no obstructive  coronary artery disease, with obesity, hyperlipidemia, diabetes, and  hypertension.  She has had two syncopal episodes.  The first occurred  several months ago resulting in fracture of her patella.  Her second episode  resulted in refracture of her patella as well as both arms.  She is now  admitted for insertion of an implantable loop recorder.   PROCEDURE:  After informed consent was obtained, the patient was taken to  the diagnostic EP Lab in the fasting state.  After the usual preparation and  draping, intravenous fentanyl was given for sedation.  A total of 30 cc of  lidocaine was infiltrated into the left pectoral region.  A 3-cm incision  was carried out over this region and electrocautery used to dissect down to  the fascial plane.  A subcutaneous pocket was made with electrocautery.  After Kanamycin was used to irrigate the pocket and electrocautery utilized  to assure hemostasis, the Medtronic implantable loop record, Model #9526,  Serial [id number] was placed in the subcutaneous pocket and secured with  two silk sutures.  Kanamycin irrigation was utilized to irrigate the  incision, and the incision was then closed with a layer of 2-0 Vicryl  followed by a layer of 3-0 Vicryl.  Benzoin was painted on the skin.  Steri-  Strips were applied,  and a sterile dressing placed, and the patient returned  to her room in satisfactory condition.   COMPLICATIONS:  There were no immediate procedural complications.   RESULTS:  Successful implantation of a Medronic implantable loop recorder in  a patient with recurrent unexplained syncope.                                               Doylene Canning. Ladona Ridgel, M.D.    GWT/MEDQ  D:  02/27/2003  T:  02/27/2003  Job:  [id number]   cc:   Rollene Rotunda, M.D.   Kathrine Cords, R.N. [facility]

## 2010-12-11 NOTE — Discharge Summary (Signed)
[name], [name]                        ACCOUNT NO.:  [id number]   MEDICAL RECORD NO.:  [id number]                   PATIENT TYPE:  INP   LOCATION:  5740                                 FACILITY:  [facility]   PHYSICIAN:  Rene Paci, M.D. [facility]          DATE OF BIRTH:  11-Dec-1945   DATE OF ADMISSION:  03/01/2003  DATE OF DISCHARGE:  03/04/2003                                 DISCHARGE SUMMARY   DISCHARGE DIAGNOSES:  1. Right lower extremity deep vein thrombosis.  2. Pulmonary embolus.   HISTORY OF PRESENT ILLNESS:  [name] is a 65 year old white female who  was seen in the office on February 28, 2003.  She was noted to be dizzy.  She  also complained of some calf pain.  She was sent to the [facility]ascular  Lab for lower extremity Dopplers.  This did show DVT in her right lower  extremity.  The patient was admitted for further evaluation, including rule  out pulmonary embolus and for anticoagulation with heparin and Coumadin.   PAST MEDICAL HISTORY:  1. 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  2. Postmenopausal.  3. Depression.  4. Hypertension.  5. Adult onset diabetes mellitus.  6. Restless leg.  7. Status post cardiac catheterization in April 1999, that showed no     obstructive disease.  8. Status post Cardiolite in June 2002, that was negative.  9. Hypercholesterolemia.  10.      Hypertension.  11.      History of syncope in July 2004.  Found to have a prolonged QT     segment at that time.  Other workup was unremarkable, including a MRI of     the brain and EEG.  Had an implantable loop recorder placed in July 2004,     currently being followed by Dr. Lewayne Bunting.  12.      Status post bilateral radial head fractures and a right tibial     plateau fracture after a syncopal episode in July 2004, followed by Dr.     Priscille Kluver.   HOSPITAL COURSE:  #1 -  RIGHT LOWER EXTREMITY DEEP VEIN THROMBOSIS:  The  patient was admitted and started on  heparin and Coumadin.  A CT of the chest  was not done because of her bilateral upper extremity fractures, however, a  V/Q scan was performed and was positive for high probability for pulmonary  embolus.  We did consult pharmacy to change her heparin to Lovenox.  They  converted her to full strength Lovenox and gave some dosing information on  her Coumadin for discharge.  The patient will need an additional 48 hours of  Lovenox once therapeutic on her Coumadin.  The patient is scheduled for a  prothrombin time/INR on March 07, 2003, to be forwarded to Dr. Hetty Ely.   DISCHARGE LABORATORY DATA:  INR was 1.4.  Hemoglobin 11.6.  Lower extremity  venous  Dopplers revealed an acute DVT from the ankle PTV through the mid  popliteal vein.   DISCHARGE MEDICATIONS:  1. Lovenox 95 mg subcutaneously q.12h.  We will treat her with 100 mg     syringes.  2. Coumadin 5 mg 1-1/2 tabs to be given on March 04, 2003, March 05, 2003,     March 06, 2003, and then 5 mg daily.  3. Tricor 160 mg daily.  4. Avandia 8 mg daily.  5. Lipitor 10 mg daily.  6. Wellbutrin 150 mg b.i.d.  7. Hyzaar 50/12.5 mg daily.  8. Meclizine 25 mg q.8h. p.r.n.   FOLLOWUP:  1. Dr. Priscille Kluver this week as scheduled.  2. Dr. Lorenza Chick office in one to two weeks.  We have arranged for a     prothrombin time to be drawn on Thursday, March 07, 2003, and the     results to be forwarded to Dr. Hetty Ely.      Cornell Barman, P.A. [facility]                  Rene Paci, M.D. [facility]    LC/MEDQ  D:  03/04/2003  T:  03/04/2003  Job:  [id number]   cc:   Laurita Quint, M.D.  [address] [place]  [place] [address]  Fax: [phone]

## 2010-12-11 NOTE — Consult Note (Signed)
[name], [name]                        ACCOUNT NO.:  [id number]   MEDICAL RECORD NO.:  [id number]                   PATIENT TYPE:  INP   LOCATION:  3704                                 FACILITY:  [facility]   PHYSICIAN:  Doylene Canning. Ladona Ridgel, M.D.               DATE OF BIRTH:  [birth year]   DATE OF CONSULTATION:  02/12/2003  DATE OF DISCHARGE:                                   CONSULTATION   INDICATIONS FOR CONSULTATION:  Evaluation of unexplained syncope.   HISTORY OF PRESENT ILLNESS:  The patient is a 65 year old woman with a  history of obesity and diabetes and dyslipidemia who was in her usual state  of health when she was admitted to the hospital after having an episode of  syncope.  This occurred suddenly without warning.  She fell to the ground  and fractured her elbows as well as her right knee.  The patient has been  admitted for additional evaluation.  Subsequent evaluation has demonstrated  normal LV systolic function, no obstructive coronary disease by cardiac  catheterization.  Interestingly, the patient in the past has been on  Lexapro.  She is also on a weight loss supplement.  She is also on  hydrochlorothiazide.  She denies associated chest pain or shortness of  breath.  She denies any significant warning prior to her syncopal episode.  On awakening she had pain and knows that she was bleeding from her nose.  She had one previous episode of syncope back in [place] earlier this year  which occurred while she was walking.   ADDITIONAL PAST MEDICAL HISTORY:  Notable for:  1. Right knee surgery secondary to a meniscus tear.  2. She has a history of hysterectomy.  3. Sinus surgery.   MEDICATIONS:  She takes multiple drug supplements including CortiSlim which  has some beta agonist effect.   SOCIAL HISTORY:  The patient is married and lives in [place].  She works  as an Airline pilot for [employer].  She denies tobacco or ethanol use.   FAMILY HISTORY:  Notable for  mother who died of natural causes at 71 and  father who died of an MI at 104.   REVIEW OF SYSTEMS:  Notable for syncope, as previously described.  She also  has a history of anxiety.  She denies headache, vision, or hearing problems.  She denies nausea, vomiting, diarrhea, or constipation.  She denies chest  pain, shortness of breath, peripheral edema, and the rest of her review of  systems was all negative.  Please see Diane Sauro's note for additional  details.   PHYSICAL EXAMINATION:  GENERAL:  Pleasant, middle-aged, obese woman in no  distress.  VITAL SIGNS:  Blood pressure is 162/68, pulse 88 and regular, respirations  18.  HEENT:  Normocephalic, atraumatic.  Pupils equal and reactive.  Oropharynx  was moist.  Sclerae anicteric.  NECK:  Obese.  No obvious jugular venous  distention.  The carotids were 2+  and symmetric.  Trachea is midline.  LUNGS:  Clear bilaterally to auscultation.  CARDIOVASCULAR:  Regular rate and rhythm with a normal S1 and S2.  ABDOMEN:  Obese, nontender, nondistended.  EXTREMITIES:  Demonstrated multiple casts of the upper extremities up to the  upper arm, and a splint of the right lower extremity.  NEUROLOGIC:  Alert and oriented x3.  Cranial nerves II-XII grossly intact.  Strength 5/5 and symmetric.   LABORATORY DATA:  EKG demonstrates normal sinus rhythm with prolongation of  the QT interval with a corrected QTC of 483 msec.   Laboratories were unrevealing otherwise except for triglycerides of 485.   IMPRESSION:  1. Recurrent syncope of sudden onset associated with multiple fractures.  2. Prolongation of the QT interval, unknown etiology and unclear     significance, with no arrhythmias in the hospital.  3. Hypertension.  4. Diabetes.  5. Dyslipidemia.   DISCUSSION:  The etiology of the patient's syncope is unclear.  There  certainly could be a component related to the prolongation of her QT  interval, but her clinical history does not go along  with that, and the  likelihood of long QT syndrome as the cause of her syncope without any  recurrent episodes in the hospital seems somewhat reduced to me.  However, I  think stopping her nutritional supplement, CortiSlim, which has a synthetic  adrenalin substance is important.  In addition, I have recommended that we  undergo head-up tilt-table testing and insertion of an implantable loop  recorder.  However, we would like to do this once her fractures have been  appropriately dealt with.  This will be scheduled as an outpatient  procedure.                                               Doylene Canning. Ladona Ridgel, M.D.    GWT/MEDQ  D:  02/13/2003  T:  02/14/2003  Job:  [id number]   cc:   [name], M.D.   [name], M.D.  [address] [place]  [place] [address]  Fax: [phone]

## 2010-12-11 NOTE — Discharge Summary (Signed)
[place]. [facility]  Patient:    [name], [name] Visit Number: [id number] MRN: [id number]          Service Type: MED Location: [facility] 2926 01 Attending Physician:  Learta Codding Dictated by:   Rozell Searing, P.A.-C. Admit Date:  10/06/2001 Discharge Date: 10/06/2001   CC:         Laurita Quint, M.D. [facility]   Referring Physician Discharge [place]  PROCEDURES:  None.  REASON FOR ADMISSION:  [name] is a 65 year old female, with a history of normal coronary angiogram but multiple cardiac risk factors suggestive of metabolic syndrome, who presented with chest discomfort felt atypical for ischemic heart disease.  Please refer to admission note for full details.  LABORATORY DATA:  WBC 8.5, hemoglobin 11.1, hematocrit 32.0 (MCV 89.6), platelets 288 on admission - follow-up hemoglobin 11.4.  INR 1.0.  D-dimer less than 0.22.  Metabolic profile normal, save for mildly elevated glucose 138 on admission.  Marginally decreased albumin 3.3 on admission, otherwise normal liver enzymes.  Cardiac enzymes:  CPK-MB negative x 2; troponin I less than 0.01 (x 2), BNP 24.4.  Admission CXR:  NAD.  HOSPITAL COURSE:  The patient ruled out for MI with normal serial cardiac enzymes.  She also had a D-dimer which was negative as well.  Recommendations were to proceed with outpatient Cardiolite stress testing if cardiac markers were negative.  Arrangements were thus made to schedule patient for an exercise Cardiolite in one week, on the same day that her husband is scheduled for a stress test as well.  Of note, the patient is also a patient of Dr. Rex Kras as well.  MEDICATION ADJUSTMENTS THIS ADMISSION:  Addition of low-dose aspirin.  DISCHARGE MEDICATIONS: 1. Baby aspirin 81 mg q.d. 2. Avandia 2 mg q.d. 3. Ditropan XL 10 mg q.d. 4. Altace 10 mg q.d. 5. Tri-Chlor 160 mg q.d.  INSTRUCTIONS:  The patient is scheduled for an exercise Cardiolite stress test on Friday,  October 13, 2001, at 12 p.m. at [facility].  She is to present to the 2nd floor.  She is to refrain from any food or drink after midnight.  The patient is to follow up with Dr. Hetty Ely in the next few weeks for continued monitoring of her diabetes mellitus, hypertension, dyslipidemia, and evaluation for anemia.  We will have her return to Dr. Daleen Squibb as previously scheduled.  DISCHARGE DIAGNOSES: 1. Nonischemic chest pain.    a. Normal serial cardiac enzymes.    b. Scheduled for outpatient exercise Cardiolite.    c. History of normal coronary angiogram. 2. Metabolic syndrome.    a. Type 2 diabetes mellitus.    b. Hypertension.    c. Hypertriglyceridemia. 3. Normocytic anemia Dictated by:   Rozell Searing, P.A.-C. Attending Physician:  Learta Codding DD:  10/06/01 TD:  10/06/01 Job: [id number]

## 2010-12-11 NOTE — H&P (Signed)
[place]. [facility]  Patient:    SANYE, LEDESMA Visit Number: [id number] MRN: [id number]          Service Type: MED Location: [facility] 2926 01 Attending Physician:  Learta Codding Dictated by:   Lewayne Bunting, M.D. [facility] Admit Date:  10/06/2001   CC:         Thomas C. Daleen Squibb, M.D. [facility]  Laurita Quint, M.D. [facility]   History and Physical  REFERRING PHYSICIAN:  Laurita Quint, M.D. [facility].  CARDIOLOGIST:  Jesse Sans. Wall, M.D. [facility].  CURRENT COMPLAINTS:  Substernal chest pain that started this evening.  HISTORY OF PRESENT ILLNESS:  The patient is a 65 year old female with no significant prior cardiac history.  The patient does have multiple risk factors for coronary artery disease and has a metabolic syndrome.  She has known diabetes mellitus, hypertension and hyperlipidemia as well as hypertriglyceridemia.  She has undergone stress test several years ago with Dr. Daleen Squibb which was borderline and was referred for a cardiac catheterization. She was found to have no significant coronary artery disease and normal LV function.  The patient has been doing well.  However, she has been under a significant amount of stress lately, particularly since her granddaughter and daughter have moved back into the house.  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  She denies any recent exertional chest pain or shortness of breath on exertion. However, this evening she developed sudden chest tightness with some diaphoresis and shortness of breath, but with no radiation to the neck, arms or shoulder.  The episode lasted well over an hour until she called EMS.  On arrival a 12-lead electrocardiogram obtained showed no acute ischemic injury. The application of oxygen apparently resolved her chest pain.  On admission to the emergency room the patient had no further chest pain and a repeat 12-lead electrocardiogram was within  normal limits.  She denies currently any orthopnea, PND, palpitations or syncope.  ALLERGIES:  PENICILLIN and CODEINE.  MEDICATIONS: 1. TriCor 160 mg p.o. q.d. 2. Altace 10 mg p.o. q.d. 3. Avandia 2 mg p.o. q.d. 4. Aspirin 81 mg a day. 5. The patient also takes a multitude of vitamins as well as a recent    prescription for Tussionex filled by Dr. Hetty Ely. 6. 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  SOCIAL HISTORY:  The patient lives with her husband.  She does not smoke.  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  FAMILY HISTORY:  Notable for coronary artery disease.  REVIEW OF SYSTEMS:  As per HPI.  No nausea or vomiting.  No fever or chills. No orthopnea, PND, palpitations, syncope.  No abdominal pain.  PHYSICAL EXAMINATION:  VITAL SIGNS:  Blood pressure is 116/66, heart rate is 72 beats per minute, temperature is 98.6, saturation 98% on room air.  GENERAL:  A well-nourished white female in no apparent distress.  HEENT:  Pupils, eyes, sclerae, mouth clear.  NECK:  Supple, no JVD or hepatojugular reflux, no carotid bruits.  LUNGS:  Clear breath sounds bilaterally.  HEART:  Regular rate and rhythm; normal S1 and S2; no murmurs, rubs or gallops.  ABDOMEN:  Soft, nontender, no rebound or guarding, good bowel sounds.  EXTREMITIES:  Peripheral pulses 2+.  No cyanosis, clubbing or edema.  ADMISSION STUDIES:  A 12-lead electrocardiogram: Normal sinus rhythm  with nonspecific T-wave changes.  Laboratory work is pending.  Chest x-ray: No acute abnormalities.  IMPRESSION AND PLAN: 1. Substernal chest pain.  The patients symptoms are rather atypical for    coronary insufficiency.  However, given the fact that she developed the    sudden onset of rest pain she will need to be ruled out for myocardial    infarction.  Enzymes have been obtained as well as  a high-sensitivity    CRP and BNP level.  If all markers are within normal limits, I do feel the    patient can continue on her current medications and can undergo an    outpatient exercise Cardiolite study.  Of note is that her husband has been    scheduled with Dr. Daleen Squibb for a stress test next week on Friday; if at all    possible if the patient will need an outpatient stress test, she would like    to have this done on the same day of her husband.  If enzyme markers are    positive, consideration should be given to an in-hospital Cardiolite study. 2. Metabolic syndrome.  The patient is on TriCor.  She has glucose intolerance    and she remains on Avandia.  Further management is as per Dr. Hetty Ely. 3. Rule out pulmonary embolus.  The patient has a low pretest probability for    pulmonary embolism, but a D-dimer level will be obtained; if this is    negative, no further testing is indicated.  DISPOSITION:  If cardiac enzymes are negative, the patient can be discharged in the morning and proceed with an outpatient Cardiolite study next week. Dictated by:   Lewayne Bunting, M.D. [facility] Attending Physician:  Learta Codding DD:  10/06/01 TD:  10/06/01 Job: [id number]

## 2010-12-11 NOTE — Discharge Summary (Signed)
[name], [name]                        ACCOUNT NO.:  [id number]   MEDICAL RECORD NO.:  [id number]                   PATIENT TYPE:  INP   LOCATION:  3704                                 FACILITY:  [facility]   PHYSICIAN:  Rene Paci, M.D. [facility]          DATE OF BIRTH:  [birth year]   DATE OF ADMISSION:  02/10/2003  DATE OF DISCHARGE:  02/14/2003                                 DISCHARGE SUMMARY   DISCHARGE DIAGNOSES:  1. Syncope.  2. Bilateral radial head fractures.  3. Right tibial plateau fracture.   ADMISSION HISTORY:  [name] is a 65 year old white female who has had  two recent syncopal episodes. They both resulted in falls with trauma and  fracture. The patient denied any forewarning. She states that these episodes  are dissimilar to her symptoms of hypoglycemia. She states that they are  sudden and last only seconds. She also described several near episodes over  the past two weeks. Of not, the patient recently had her blood pressure  medication changed from Altace to Hyzaar. She states that otherwise her  diabetes is well controlled.   PAST MEDICAL HISTORY:  1. Hypertension.  2. Hyperlipidemia.  3. Adult onset diabetes mellitus.   HOSPITAL COURSE:  Problem 1.  Ortho. The patient has had two syncope  episodes resulting in falls and she has sustained a right tibial plateau  fracture as well as a left radial hear fracture and slightly displaced intra-  articular radial head fracture. the patient was seen in consultation by Dr.  Sherlean Foot. The patient was placed in a cast and a knee immobilizer. He states he  will follow up with the patient on August 17. Weightbearing status is to be  determined by ortho.   Problem 2. Syncopal event. The patient had two episodes of syncope, the  etiology was not clear. The patient had no arrhythmias while here. She was  seen in consultation by cardiology who recommended a cardiac catheterization  to rule out ischemia. The cardiac  cath showed no obstructive disease and  preserved LV function. It was then felt that the patient needed an EP  evaluation with a head up tilt and placement of a loop recorder. 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 She  does have a family history of her father passing away at age 56 by MI and  sudden death. Of note, the patient was taking a medication called CortiSlim  in efforts to help her lose weight. We have asked her to discontinue this  medicine and currently pharmacy is looking into the medication to see if  there is anything that might prolong QT. Otherwise she does not have a  family history of long QT syndrome. Dr. Ladona Ridgel noted that with a  structurally normal heart the etiology of the syncope is unclear. He did  recommend head up tilt and insertion of an ILR  and stated that he would  schedule this as an outpatient. He did make recommendations to avoid QT  prolonging medications. Neurologic was also asked to see the patient. MRI  showed no acute abnormalities with some mild small vessel change and patent  cerebral vasculature. EEG showed no evidence of seizure. Dr. Orlin Hilding had  seen the patient and she noted that the sharp attacks with loss of  consciousness without any obvious neurologic etiology, however, she stated  this does not unequivocally rule out such underlying etiology. She did agree  with pursuing a tilt table and ILR without any further neurologic testing  indicated at this time. As noted, the patient has not had any other syncope  or near syncope episodes.   Problem 2.  Hypertriglyceridemia. The patient's triglycerides were elevated  at 400 and she is currently on Lipitor and TriCor.   Discharge BMET was normal with triglycerides of 149, hemoglobin 11.8, sed.  rate 17, coags were normal. LFTs were normal. Total cholesterol 170,  triglycerides 585, HDL 42, LDL normal, TSH 1.1547, B12 502. RPR nonreactive.   DISCHARGE  MEDICATIONS:  1. Avandia 8 mg daily.  2. TriCor 160 mg daily.  3. Lipitor 10 mg daily.  4. Wellbutrin 150 mg b.i.d.  5. Hyzaar 50/12.5 daily.  6. Darvocet-N 100 as needed.   DISCHARGE INSTRUCTIONS:  She has been instructed not to take CortiSlim. She  can follow-up with Dr. Sherlean Foot August 17 at 8:40 a.m. and Dr. Hetty Ely on  Friday August 13 at 10:30 a.m. and Dr. Lewayne Bunting as instructed for a tilt  table and ILR.     Cornell Barman, P.A. [facility]                  Rene Paci, M.D. [facility]    LC/MEDQ  D:  02/14/2003  T:  02/15/2003  Job:  [id number]   cc:   Laurita Quint, M.D.  [address] [place]  [place] [address]  Fax: [phone]   Dr. Enzo Montgomery. Ladona Ridgel, M.D.    cc:   Laurita Quint, M.D.  [address] [place]  [place] [address]  Fax: [phone]   Dr. Enzo Montgomery. Ladona Ridgel, M.D.

## 2010-12-11 NOTE — Discharge Summary (Signed)
[name], [name]              ACCOUNT NO.:  [id number]   MEDICAL RECORD NO.:  [id number]          PATIENT TYPE:  OIB   LOCATION:  2899                         FACILITY:  [facility]   PHYSICIAN:  Maple Mirza, P.A. DATE OF BIRTH:  January 27, 1946   DATE OF ADMISSION:  01/13/2005  DATE OF DISCHARGE:                                 DISCHARGE SUMMARY   Audio too short to transcribe (less than 5 seconds)       GM/MEDQ  D:  01/13/2005  T:  01/13/2005  Job:  [id number]

## 2010-12-11 NOTE — Consult Note (Signed)
NAME:  [name], [name]                        ACCOUNT NO.:  [id number]   MEDICAL RECORD NO.:  [id number]                   PATIENT TYPE:  INP   LOCATION:  3704                                 FACILITY:  [facility]   PHYSICIAN:  Gustavus Messing. Orlin Hilding, M.D.          DATE OF BIRTH:  10-30-1945   DATE OF CONSULTATION:  02/11/2003  DATE OF DISCHARGE:                                   CONSULTATION   REPORT TITLE:  NEUROLOGY CONSULTATION   REFERRING PHYSICIAN:  Stacie Glaze, M.D. [facility]   CHIEF COMPLAINT:  Two syncopal spells.   HISTORY OF PRESENT ILLNESS:  [name] is a 65 year old right-handed  white woman seen by me for vague symptoms of forgetfulness a couple of years  ago with a left hemiparesis which turned out to be Bell's palsy. She had  mild small vessel disease on a MRI scan on Nov 30, 2000. She was admitted  yesterday after a second of two syncopal events with severe injury.   On the day of admission, she had been emptying something into the trash can  in the garage and then just came to on the floor. The event was unwitnessed,  but she had not bitten her tongue or been incontinent. There was no  postictal confusion. No premonitory symptoms of any kind. No palpitations,  dizziness, deja vu symptoms or anything to suggest definite arrhythmia or  seizure-like symptoms. She was able to actually get up and walk a few feet  to ring the back doorbell and call for help. She had fractured both radial  heads and right tibial plateau. She was taken to the emergency room for  evaluation.   A few months ago, she had similar spell witnessed by her granddaughter where  she had just fallen down with loss of consciousness with no premonitory  symptoms. No convulsions were seen by the granddaughter. No incontinence or  tongue biting. She injured her right knee at that time and had some  cartilage damage. This was around [place] time and apparently had some  surgery on that. Cardiac workup is  currently underway.   REVIEW OF SYSTEMS:  Positive for recurrent left Bell's palsy, at least two.  She has had some dizziness at work, but this was nonspecific, but these two  spells with loss of consciousness were not associated with any preceding  dizziness. She has moderate anxiety.   PAST MEDICAL HISTORY:  1. Obesity.  2. Hypertension.  3. Noninsulin-dependent diabetes.  4. Hyperlipidemia.  5. Left cataract extraction.  6. Right knee surgery in May 2004.  7. Remote hysterectomy with bladder tack.  8. Remote sinus surgery.  9. Recurrent left Bell's palsy.  10.      Depression with anxiety.   MEDICATIONS:  Lexapro, hydrochlorothiazide, Lipitor, Wellbutrin, Flexeril,  Avandia, TriCor, Hyzaar, Allegra, Flonase, and aspirin.   ALLERGIES:  PENICILLIN and CODEINE. CODEINE causes hallucinations.   SOCIAL HISTORY:  Married with children. Works  for __________. No cigarette  use. No alcohol use. No drug use.   FAMILY HISTORY:  Positive for coronary artery disease.   PHYSICAL EXAMINATION:  VITAL SIGNS:  Temperature 98.2, blood pressure  108/62, pulse 88, respirations 20, 96% saturation on room air.  HEENT:  Head is normocephalic, atraumatic. Left eye with some ecchymosis  with small laceration over the left brow.  NECK:  Supple without bruits.  HEART:  Regular rate and rhythm without murmurs.  EXTREMITIES:  She is casted from mid upper arm to wrist bilaterally and a  splint on the right lower extremity from mid thigh to ankle.  NEUROLOGICAL:  Mental status:  She is awake, alert, and appropriate with  fluent spontaneous language. Normal naming, comprehension, and repetition.  Cranial nerves:  Pupils are equal and reactive. Disk margins are sharp.  Visual fields are full bilaterally. She has had an iridectomy on the left.  Extraocular movements are intact without nystagmus, ophthalmoparesis, or  ptosis. Facial sensation is normal. Facial motor activity demonstrates an  old peripheral  left 7th weakness. Hearing is intact to voice and finger  scratch. Palate is symmetric and tongue is midline. Motor examination is  extremely limited. Due to the cast, I really cannot check her upper  extremity strength. She can move her shoulders and wiggle her fingers.  Because of the cast of nearly from shoulder to up over the palm, I simply  cannot do anything further. Likewise, in the lower extremities, I can only  really test the foot dorsiflexion on the right which is normal and the left  lower extremity strength is normal. Reflexes:  Once again, I cannot test the  upper extremities or right lower extremity, but she has a 2+ knee jerk on  the left, absent ankle jerk on the left, downgoing toes bilaterally to  plantar stimulation. Coordination:  She can do heel-to-shin on the left.  Once again, because of the casts, I really cannot get a good idea of what is  possible on the other side. Sensory:  Exposed areas of skin appear to be  normal.   LABORATORY DATA:  CT scan of the brain was normal. She had had a previous  MRI that showed very mild small-vessel disease compatible with her medical  history. Laboratories are unrevealing.   IMPRESSION:  Syncope, loss of consciousness with two falls with significant  injury, without any prodromal or premonitory symptoms. No incontinence,  tongue biting. No convulsive activity witnessed on the first event. The  second was unwitnessed. The loss of consciousness is of brief duration  without any postictal confusion. Seizure is felt to be unlikely. I cannot  rule out vertebrobasilar insufficiency; however, this could be Stokes-Adams  attacks.   RECOMMENDATIONS:  Check EEG. Check MRI scan of the brain with MRI angiogram  of the neck and intracranial vessels to evaluate vertebral and basilar  arteries. This may be difficult because of her size and bilateral upper extremity casts, however. If it cannot be done here as an inpatient, she may  need to  be sent to the [facility] via [facility] to do open magnet. Cardiac  workup as outlined by Dr. Chales Abrahams.                                               Catherine A. Orlin Hilding, M.D.    CAW/MEDQ  D:  02/11/2003  T:  02/12/2003  Job:  [id number]   cc:   Stacie Glaze, M.D. [facility]

## 2010-12-11 NOTE — Cardiovascular Report (Signed)
   NAME:  [name], [name]                        ACCOUNT NO.:  [id number]   MEDICAL RECORD NO.:  [id number]                   PATIENT TYPE:  INP   LOCATION:  3704                                 FACILITY:  [facility]   PHYSICIAN:  Rollene Rotunda, M.D.                DATE OF BIRTH:  09/07/1945   DATE OF PROCEDURE:  02/12/2003  DATE OF DISCHARGE:                              CARDIAC CATHETERIZATION   PRIMARY CARE PHYSICIAN:  Laurita Quint, M.D.   INDICATION:  Evaluate patient with syncope.  This was a frank syncopal  episode with trauma.  It is unclear whether this might be an arrhythmic  etiology.  She has multiple cardiovascular risk factors.  She had previous  abnormal stress perfusion study in the past.  In order to rule out ischemia,  we thought it most prudent to proceed directly to cardiac catheterization.   PROCEDURE NOTE:  Left heart catheterization was performed via the right  femoral artery.  The artery was cannulated using an anterior wall puncture.  A 6 French arterial sheath was inserted via the modified Seldinger  technique.  Preformed Judkins and a pigtail catheter were utilized.  The  patient tolerated the procedure well and left the lab in stable condition.   RESULTS:   HEMODYNAMICS:  1. LV 126/11.  2. Aortic 125/68.   CORONARIES:  The left main was normal.  The LAD had proximal 25% stenosis.  This is a somewhat vessel in the mid segment after a large diagonal.  The  LAD system was free of disease.  The circumflex was a very large dominant  vessel.  There was large mid obtuse marginal.  There were two large  posterior laterals.  The circ system was free of disease.  The right  coronary artery was small and nondominant.   LEFT VENTRICULOGRAM:  Left ventriculogram was obtained in the RAO  projection.  The EF was 65% with normal wall motion.    CONCLUSION:  1. Minimal coronary artery plaque.  2. Normal left ventricular function.   PLAN:  The patient will have  further workup with probable tilt table testing  and probable event monitor.                                                 Rollene Rotunda, M.D.    JH/MEDQ  D:  02/12/2003  T:  02/12/2003  Job:  [id number]  Laurita Quint, M.D.  [address] [place]  [place] [address]  Fax: [phone]   cc:   Laurita Quint, M.D.  [address] [place]  [place] [address]  Fax: [phone]

## 2010-12-11 NOTE — Op Note (Signed)
NAMEMUNIRAH, DOERNER              ACCOUNT NO.:  [id number]   MEDICAL RECORD NO.:  [id number]          PATIENT TYPE:  OIB   LOCATION:  2899                         FACILITY:  [facility]   PHYSICIAN:  Doylene Canning. Ladona Ridgel, M.D.  DATE OF BIRTH:  04-Jul-1946   DATE OF PROCEDURE:  01/13/2005  DATE OF DISCHARGE:                                 OPERATIVE REPORT   PROCEDURE PERFORMED:  Removal of an implantable loop recorder.   SURGEON:  Doylene Canning. Ladona Ridgel, M.D.   INDICATIONS:  Status post implantable loop recorder, implant secondary to  syncope, now with device at end-of-life.   I. INTRODUCTION:  The patient is a 65 year old woman with a history of  unexplained syncope resulting in multiple fractures.  She underwent  implantable loop recorder insertion.  She is now over 20 months out from her  loop recorder and returns today for followup.  She has had no recurrent  syncope.  Her device is now at end-of-life.   II. PROCEDURE:  After informed consent was obtained, the patient was taken  to the diagnostic EP lab in a fasting state.  After the usual preparation  and draping, a total of 30 mL of lidocaine were infiltrated in the old  implantable loop recorder insertion site.  A 3-cm incision was carried out  over this region and electrocautery utilized to dissect down to the fascial  plane.  The loop recorder was removed from its pocket without difficulty.  Its silk sutures were also removed.  The pocket was irrigated and hemostasis  assured in the patient and the incision was closed with a layer of 2-0  Vicryl followed by a layer of 3-0 Vicryl.  Benzoin was painted on the skin,  Steri-Strips were applied and a pressure dressing was placed and the patient  was returned to her room in satisfactory condition.   III. COMPLICATIONS:  There were no immediate procedural complications.   RESULTS:  This demonstrates successful removal of implantable loop recorder  in a patient with a previous history of syncope  and status post loop  recorder.       GWT/MEDQ  D:  01/13/2005  T:  01/13/2005  Job:  [id number]

## 2010-12-11 NOTE — Op Note (Signed)
[place]. [facility]  Patient:    CALIEGH, MIDDLEKAUFF                     MRN: [id number] Proc. Date: 05/19/00 Adm. Date:  [id number] Attending:  Janalyn Rouse                           Operative Report  PREOPERATIVE DIAGNOSIS:  Fibrocystic disease of the left breast with a mass.  POSTOPERATIVE DIAGNOSIS:  Fibrocystic disease of the left breast with a mass.  OPERATION:  Excision of left breast mass.  SURGEON:  Rose Phi. Maple Hudson, M.D.  ANESTHESIA:  MAC  DESCRIPTION OF PROCEDURE:  The patient was placed on the operating room table. The left arm extended on the arm board.  The left breast was prepped and draped in the usual fashion.  A curvilinear incision was marked over the palpable nodular abnormality in the upper outer quadrant.  The area was then thoroughly infiltrated with 1% xylocaine with adrenalin. Incision was made and sharp dissection of this fibronodular breast tissue was excised.  Hemostasis was obtained with a cautery.  Subcuticular closure with 4-0 Monocryl and Steri-Strips carried out.  Dressing applied.  The patient was transferred to the recovery room in satisfactory condition having tolerated the procedure well. DD:  05/19/00 TD:  05/19/00 Job: [id number]

## 2010-12-24 ENCOUNTER — Ambulatory Visit (INDEPENDENT_AMBULATORY_CARE_PROVIDER_SITE_OTHER): Payer: 59 | Admitting: Family Medicine

## 2010-12-24 ENCOUNTER — Encounter: Payer: Self-pay | Admitting: Family Medicine

## 2010-12-24 VITALS — BP 122/70 | HR 65 | Temp 97.6°F | Ht 62.0 in | Wt 155.1 lb

## 2010-12-24 DIAGNOSIS — R5381 Other malaise: Secondary | ICD-10-CM

## 2010-12-24 DIAGNOSIS — R5383 Other fatigue: Secondary | ICD-10-CM

## 2010-12-24 NOTE — Progress Notes (Signed)
65 yo here for fatigue.  Has been extensively worked up in past due to associated episodes of syncope.  No recurrent episodes of syncope.   Most recently was in 08/2009- complete TIA and cardiac workup that was negative at [facility], including neg dopplers, MRI, 2 decho.  CBC,  TSH, Vit D, BMET all within normal limits this month.    Daughter thinks she has sleep apnea-hears her snoring a lot.  Not sure if she is choking in her sleep.    Depression improved.    The PMH, PSH, Social History, Family History, Medications, and allergies have been reviewed in [facility], and have been updated if relevant.   Review of Systems       See HPI General:  Denies fever. Eyes:  Denies blurring. ENT:  Denies difficulty swallowing. CV:  Denies chest pain or discomfort. Resp:  Denies shortness of breath. GI:  Denies abdominal pain and change in bowel habits. MS:  Denies joint pain, joint redness, and joint swelling. Neuro:  Denies falling down, headaches, inability to speak, poor balance, seizures, sensation of room spinning, visual disturbances, and weakness.  Physical Exam BP 122/70  Pulse 65  Temp(Src) 97.6 F (36.4 C) (Oral)  Ht 5\' 2"  (1.575 m)  Wt 155 lb 1.9 oz (70.362 kg)  BMI 28.37 kg/m2   General:  Well-developed,well-nourished,in no acute distress; alert,appropriate and cooperative throughout examination, minimally overweight. Mouth:  Oral mucosa and oropharynx without lesions or exudates.  Teeth in good repair. Left facial paralysis unchanged.   Neck:  No deformities, masses, or tenderness noted. No bruits Lungs:  Normal respiratory effort, chest expands symmetrically. Lungs are clear to auscultation, no crackles or wheezes. Heart:  Normal rate and regular rhythm. S1 and S2 normal without gallop, murmur, click, rub or other extra sounds. Abdomen:  Bowel sounds positive,abdomen soft and non-tender without masses, organomegaly or hernias noted. Extremities:  No clubbing, cyanosis, edema, or  deformity noted with normal full range of motion of all joints.   Neurologic:  No cranial nerve deficits noted. Station and gait are normal. Sensory, motor and coordinative functions appear intact. Psych:  Cognition and judgment appear intact. Alert and cooperative with normal attention span and concentration. No apparent delusions, illusions, hallucinations

## 2010-12-24 NOTE — Patient Instructions (Signed)
Please stop by to see [name] on your way out. 

## 2010-12-24 NOTE — Assessment & Plan Note (Signed)
Unchanged. Refer to pulmonary for sleep study/further work up. Exam unremarkable.

## 2010-12-29 ENCOUNTER — Telehealth: Payer: Self-pay | Admitting: *Deleted

## 2010-12-29 MED ORDER — SIMVASTATIN 80 MG PO TABS: 80.0000 mg | ORAL_TABLET | Freq: Every day | ORAL | Status: DC

## 2010-12-29 MED ORDER — ALPRAZOLAM 0.5 MG PO TABS: 0.5000 mg | ORAL_TABLET | Freq: Two times a day (BID) | ORAL | Status: DC

## 2010-12-29 NOTE — Telephone Encounter (Signed)
Pt request written 90 day rx's for Xanax and simvastatin, she will p/u Thurs or Fri and mail to [facility]. Rx's are correct in epic.

## 2010-12-29 NOTE — Telephone Encounter (Signed)
Rx's left at front desk for patient pick up.

## 2010-12-29 NOTE — Telephone Encounter (Signed)
Rx ready on my desk.

## 2011-01-06 ENCOUNTER — Ambulatory Visit (INDEPENDENT_AMBULATORY_CARE_PROVIDER_SITE_OTHER): Payer: 59 | Admitting: Psychology

## 2011-01-06 DIAGNOSIS — F331 Major depressive disorder, recurrent, moderate: Secondary | ICD-10-CM

## 2011-01-15 ENCOUNTER — Encounter: Payer: Self-pay | Admitting: Pulmonary Disease

## 2011-01-18 ENCOUNTER — Ambulatory Visit (INDEPENDENT_AMBULATORY_CARE_PROVIDER_SITE_OTHER): Payer: 59 | Admitting: Pulmonary Disease

## 2011-01-18 ENCOUNTER — Encounter: Payer: Self-pay | Admitting: Pulmonary Disease

## 2011-01-18 VITALS — BP 110/78 | HR 80 | Temp 97.9°F | Ht 61.0 in | Wt 158.0 lb

## 2011-01-18 DIAGNOSIS — G4710 Hypersomnia, unspecified: Secondary | ICD-10-CM | POA: Insufficient documentation

## 2011-01-18 HISTORY — DX: Hypersomnia, unspecified: G47.10

## 2011-01-18 NOTE — Patient Instructions (Signed)
Will set up for sleep study, and setup followup visit once results available.

## 2011-01-18 NOTE — Progress Notes (Signed)
Subjective:    Patient ID: [name], female    DOB: 09/03/1945, 65 y.o.   MRN: [id number]  HPI The pt is a 65y/o female who I have been asked to see for hypersomnia.  She was initially evaluated for this in 2001, where she had symptoms for about 10yrs prior to that.  She had a sleep study 2001 which showed AHI 8/hr, and 109 PLMS with 6/hr with arousal/awakening.  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  She was then started on cpap as a trial, but was unable to tolerate due to full face mask.  She was referred to neurology for evaluation as well, and MRI was abnormal showing small vessel disease.  Apparently, she has had a "blackout" which was never explained.  The pt feels her EDS has been worse over the last one year.  She falls asleep at her work station, and is hard for her to read in the evening without dozing.  She denies sleepiness with driving.  She is still snoring according to family, but have not mentioned apneas.  She denies choking arousals.  She is not rested in the am's upon arising.  Her weight is down 46 pounds since last visit, and her epworth score is 17 today.    Sleep Questionnaire: What time do you typically go to bed?( Between what hours) 9pm How long does it take you to fall asleep? 20 to 30 mins How many times during the night do you wake up? 2 What time do you get out of bed to start your day? 0530 Do you drive or operate heavy machinery in your occupation? No How much has your weight changed (up or down) over the past two years? (In pounds) Have you ever had a sleep study before? Yes If yes, location of study? [facility] If yes, date of study? 10 to 15 years ago Do you currently use CPAP? No Do you wear oxygen at any time? No      Review of Systems  Constitutional: Negative for fever and unexpected weight change.  HENT: Negative for ear pain, nosebleeds, congestion, sore throat, rhinorrhea, sneezing, trouble swallowing, dental problem, postnasal  drip and sinus pressure.   Eyes: Negative for redness and itching.  Respiratory: Negative for cough, chest tightness, shortness of breath and wheezing.   Cardiovascular: Negative for palpitations and leg swelling.  Gastrointestinal: Negative for nausea and vomiting.  Genitourinary: Negative for dysuria.  Musculoskeletal: Negative for joint swelling.  Skin: Negative for rash.  Neurological: Positive for headaches.  Hematological: Does not bruise/bleed easily.  Psychiatric/Behavioral: Positive for dysphoric mood. The patient is nervous/anxious.        Objective:   Physical Exam Constitutional:overweight female, no acute distress  HENT:  Nares patent without discharge  Oropharynx without exudate, palate and uvula are normal  Eyes:  Perrla, eomi, no scleral icterus  Neck:  No JVD, no TMG  Cardiovascular:  Normal rate, regular rhythm, no rubs or gallops.  No murmurs        Intact distal pulses  Pulmonary :  Normal breath sounds, no stridor or respiratory distress   No rales, rhonchi, or wheezing  Abdominal:  Soft, nondistended, bowel sounds present.  No tenderness noted.   Musculoskeletal:  No lower extremity edema noted.  Lymph Nodes:  No cervical lymphadenopathy noted  Skin:  No cyanosis noted  Neurologic:  Alert, appropriate, moves all 4 extremities without obvious deficit. +facial droop.  Assessment & Plan:

## 2011-01-18 NOTE — Assessment & Plan Note (Addendum)
The pt is describing significant daytime hypersomnia, but also nonrestorative sleep.  She only had mild osa back in 2001, and has lost significant weight since that time.  She does not give symptoms currently of a movement disorder of sleep.  It is unclear whether this is some type of sleep disorder, or whether is related to a neurologic process?  She also has a h/o depression.  Will start with a repeat sleep study, then go from there.

## 2011-01-19 ENCOUNTER — Ambulatory Visit
Admission: RE | Admit: 2011-01-19 | Discharge: 2011-01-19 | Disposition: A | Discharge status: Home / Self Care | Payer: 59 | Source: Ambulatory Visit | Attending: Family Medicine | Admitting: Family Medicine

## 2011-01-19 DIAGNOSIS — Z1231 Encounter for screening mammogram for malignant neoplasm of breast: Secondary | ICD-10-CM

## 2011-01-20 ENCOUNTER — Encounter: Payer: Self-pay | Admitting: *Deleted

## 2011-01-25 ENCOUNTER — Other Ambulatory Visit: Payer: Self-pay | Admitting: Family Medicine

## 2011-02-26 ENCOUNTER — Ambulatory Visit (HOSPITAL_BASED_OUTPATIENT_CLINIC_OR_DEPARTMENT_OTHER): Payer: 59 | Attending: Pulmonary Disease

## 2011-02-26 DIAGNOSIS — G4730 Sleep apnea, unspecified: Secondary | ICD-10-CM | POA: Insufficient documentation

## 2011-02-26 DIAGNOSIS — G4710 Hypersomnia, unspecified: Secondary | ICD-10-CM

## 2011-03-10 ENCOUNTER — Telehealth: Payer: Self-pay | Admitting: *Deleted

## 2011-03-10 DIAGNOSIS — G4710 Hypersomnia, unspecified: Secondary | ICD-10-CM

## 2011-03-10 DIAGNOSIS — G4730 Sleep apnea, unspecified: Secondary | ICD-10-CM

## 2011-03-10 NOTE — Telephone Encounter (Signed)
Pt needs ov with KC to discuss sleep study results.  [id number]

## 2011-03-10 NOTE — Procedures (Signed)
[name], [name]              ACCOUNT NO.:  [id number]  MEDICAL RECORD NO.:  [id number]          PATIENT TYPE:  OUT  LOCATION:  SLEEP CENTER                 FACILITY:  [facility]  PHYSICIAN:  Barbaraann Share, MD,FCCPDATE OF BIRTH:  24-Mar-1945  DATE OF STUDY:  02/26/2011                           NOCTURNAL POLYSOMNOGRAM  REFERRING PHYSICIAN:  Barbaraann Share, MD,FCCP  INDICATION FOR STUDY:  Hypersomnia with sleep apnea.  EPWORTH SLEEPINESS SCORE:  17.  MEDICATIONS:  SLEEP ARCHITECTURE:  The patient had total sleep time of 283 minutes with no slow wave sleep and only 28 minutes of REM.  Sleep onset latency was normal at 10.5 minutes, and REM onset was prolonged at 147 minutes. Sleep efficiency was moderately reduced at 78%.  RESPIRATORY DATA:  The patient was found to have 10 apneas and 4 obstructive hypopneas, giving her an apnea/hypopnea index of only 3 events per hour.  The events occurred in all body positions and there was mild snoring noted throughout.  OXYGEN DATA:  There was O2 desaturation as low as 85% with her occasional obstructive events.  She spent less than 1 minute the entire night less than 88% saturation.  CARDIAC DATA:  No clinically significant arrhythmias were noted.  MOVEMENT-PARASOMNIA:  The patient had no significant leg jerks or abnormal behaviors noted.  IMPRESSIONS-RECOMMENDATIONS:  Small numbers of obstructive events, which do not meet the AHI criteria for the obstructive sleep apnea syndrome. There were no other abnormalities noted, which could lead to sleep disruption.  Further investigation may be warranted if the patient is continuing to have significant daytime sleepiness that is unexplained by this sleep study.    Barbaraann Share, MD,FCCP Diplomate, American Board of Sleep Medicine Electronically Signed   [facility]  D:  03/10/2011 08:30:36  T:  03/10/2011 10:09:53  Job:  [id number]

## 2011-03-11 NOTE — Telephone Encounter (Signed)
PT RETURNED CALL. I HAVE SCHEDULED OV. NOTHING FURTHER NEEDED. [name]

## 2011-03-25 ENCOUNTER — Ambulatory Visit (INDEPENDENT_AMBULATORY_CARE_PROVIDER_SITE_OTHER): Payer: 59 | Admitting: Pulmonary Disease

## 2011-03-25 ENCOUNTER — Encounter: Payer: Self-pay | Admitting: Pulmonary Disease

## 2011-03-25 VITALS — BP 120/78 | HR 71 | Temp 97.6°F | Ht 62.0 in | Wt 161.4 lb

## 2011-03-25 DIAGNOSIS — G4710 Hypersomnia, unspecified: Secondary | ICD-10-CM

## 2011-03-25 NOTE — Assessment & Plan Note (Signed)
The patient has daytime hypersomnia of unknown origin.  Her sleep study shows no clinically significant sleep apnea and no primary movement disorder of sleep.  She denies any environmental issues which could be contributing, and also denies chronic musculoskeletal pain.  There is nothing to suggest narcolepsy or idiopathic hypersomnia, and the patient states that her symptoms did not start until 4 years ago when her husband died.  Certainly depression can contribute to her symptoms.  She is also taking a lot of medications that can cause daytime sleepiness.  At this point, would consider whether she needs a change in her meds, or possibly aggressive treatment of depression.  She is also complaining of headaches, and would consider a neurologic evaluation as well.  I will leave this to her primary care physician.

## 2011-03-25 NOTE — Patient Instructions (Signed)
Will send a note to your primary md to consider other causes of daytime sleepiness.  These can include medications, neurologic conditions, and depression.

## 2011-03-25 NOTE — Progress Notes (Signed)
  Subjective:    Patient ID: [name], female    DOB: 08-Jun-1946, 65 y.o.   MRN: [id number]  HPI The patient comes in today for followup of her recent sleep study, as part of a workup for daytime hypersomnia.  She was found to have no clinically significant sleep disordered breathing, with AHI of 3 events per hour.  She had no periodic leg movements, or other abnormal behaviors that could disrupt sleep.  I have reviewed the sleep study with her in detail, and have answered all of her questions.   Review of Systems  Constitutional: Negative for fever and unexpected weight change.  HENT: Negative for ear pain, nosebleeds, congestion, sore throat, rhinorrhea, sneezing, trouble swallowing, dental problem, postnasal drip and sinus pressure.   Eyes: Negative for redness and itching.  Respiratory: Positive for cough. Negative for chest tightness, shortness of breath and wheezing.   Cardiovascular: Positive for leg swelling. Negative for palpitations.  Gastrointestinal: Negative for nausea and vomiting.  Genitourinary: Negative for dysuria.  Musculoskeletal: Negative for joint swelling.  Skin: Negative for rash.  Neurological: Positive for headaches.  Hematological: Bruises/bleeds easily.  Psychiatric/Behavioral: Negative for dysphoric mood. The patient is not nervous/anxious.        Objective:   Physical Exam Overweight female in no acute distress Nose without purulent discharge noted Lower extremities with mild edema, no cyanosis Appears alert, does not appear sleepy, lives all 4 extremities.       Assessment & Plan:

## 2011-04-05 ENCOUNTER — Telehealth: Payer: Self-pay | Admitting: *Deleted

## 2011-04-05 NOTE — Telephone Encounter (Signed)
Patient advised as instructed via telephone.  Appt scheduled for 04/07/2011 at 8:00am.

## 2011-04-05 NOTE — Telephone Encounter (Signed)
I have reviewed his notes. We can refer to neurologist.  I think it would be best for her to come in to see me so we can discuss in more detail the things mentioned in his note.

## 2011-04-05 NOTE — Telephone Encounter (Signed)
Pt is asking that you review her last office note from seeing Dr. Shelle Iron.  He discussed her sleeping problems and suggested she see a neurologist.  Please advise pt on what you think.

## 2011-04-07 ENCOUNTER — Encounter: Payer: Self-pay | Admitting: Neurology

## 2011-04-07 ENCOUNTER — Ambulatory Visit (INDEPENDENT_AMBULATORY_CARE_PROVIDER_SITE_OTHER): Payer: 59 | Admitting: Family Medicine

## 2011-04-07 ENCOUNTER — Encounter: Payer: Self-pay | Admitting: Family Medicine

## 2011-04-07 VITALS — BP 120/70 | HR 74 | Temp 97.9°F | Wt 160.0 lb

## 2011-04-07 DIAGNOSIS — R439 Unspecified disturbances of smell and taste: Secondary | ICD-10-CM

## 2011-04-07 DIAGNOSIS — R481 Agnosia: Secondary | ICD-10-CM

## 2011-04-07 DIAGNOSIS — F341 Dysthymic disorder: Secondary | ICD-10-CM

## 2011-04-07 DIAGNOSIS — G4710 Hypersomnia, unspecified: Secondary | ICD-10-CM

## 2011-04-07 DIAGNOSIS — I10 Essential (primary) hypertension: Secondary | ICD-10-CM

## 2011-04-07 DIAGNOSIS — R430 Anosmia: Secondary | ICD-10-CM

## 2011-04-07 DIAGNOSIS — R519 Headache, unspecified: Secondary | ICD-10-CM | POA: Insufficient documentation

## 2011-04-07 DIAGNOSIS — R51 Headache: Secondary | ICD-10-CM

## 2011-04-07 DIAGNOSIS — R488 Other symbolic dysfunctions: Secondary | ICD-10-CM

## 2011-04-07 HISTORY — DX: Agnosia: R48.1

## 2011-04-07 NOTE — Progress Notes (Signed)
65 yo here to follow up last month's office visit with Dr. Shelle Iron. We referred her to pulm for evaluation of hypersomnia.  Notes reviewed.  Her sleep study shows no clinically significant sleep apnea and no primary movement disorder of sleep.  There was nothing to suggest narcolepsy or idiopathic hypersomnia.  Dr. Shelle Iron suggetsed considering whether she needs a change in her meds, or possibly aggressive treatment of depression, for which she is taking Prozac 20 mg daily and as needed Xanax. Saw Dr. Laymond Purser three times for psychotherapy, felt it "did not help much." No SI or HI and she feels like "she is ok."  She is also complained of HA during this visit and suggested that pt should  consider a neurologic evaluation. Pt reports a daily headache "on top of her head, " feels like something is pushing down. Ibuprofen alleviates pain. Not associated with nausea, vomiting, photophobia or blurred vision. Denies any new focal neurological changes.  Also reports a loss of sense of taste and smell in last 6 months that is getting progressively worse. Not specific to certain types of smells.   Multiple syncope work ups in past -Most recently was in 08/2009- complete TIA and cardiac workup that was negative at [facility], including neg dopplers, MRI, 2 decho.    The PMH, PSH, Social History, Family History, Medications, and allergies have been reviewed in [facility], and have been updated if relevant.   Review of Systems       See HPI  Physical Exam BP 120/70  Pulse 74  Temp(Src) 97.9 F (36.6 C) (Oral)  Wt 160 lb (72.576 kg)  General:  Well-developed,well-nourished,in no acute distress; alert,appropriate and cooperative throughout examination, minimally overweight. Mouth:  Oral mucosa and oropharynx without lesions or exudates.  Teeth in good repair. Left facial paralysis unchanged.   Neck:  No deformities, masses, or tenderness noted. No bruits Lungs:  Normal respiratory effort, chest expands  symmetrically. Lungs are clear to auscultation, no crackles or wheezes. Heart:  Normal rate and regular rhythm. S1 and S2 normal without gallop, murmur, click, rub or other extra sounds. Abdomen:  Bowel sounds positive,abdomen soft and non-tender without masses, organomegaly or hernias noted. Extremities:  No clubbing, cyanosis, edema, or deformity noted with normal full range of motion of all joints.   Neurologic:  No cranial nerve deficits noted. Station and gait are normal. Sensory, motor and coordinative functions appear intact. Psych:  Cognition and judgment appear intact. Alert and cooperative with normal attention span and concentration. No apparent delusions, illusions, hallucinations  Assessment and Plan: 1. Hypersomnia   Likely multifactorial and related to her anxiety and depression. Pt does not want to change antidepressants or to continue psychotherapy at this time.   2. ANXIETY DEPRESSION   See above   3. Headache   Does not sound migranous in nature. Will refer to neurology for further work up along with her complaint of loss of sense of taste and smell. Ambulatory referral to Neurology  4. Loss of perception for taste  Ambulatory referral to Neurology  5. Loss, sense of, smell  Ambulatory referral to Neurology

## 2011-04-07 NOTE — Patient Instructions (Signed)
Please stop by to see Marion on your way out. 

## 2011-04-16 ENCOUNTER — Ambulatory Visit: Payer: 59 | Admitting: Neurology

## 2011-04-16 ENCOUNTER — Ambulatory Visit (INDEPENDENT_AMBULATORY_CARE_PROVIDER_SITE_OTHER): Payer: 59 | Admitting: Neurology

## 2011-04-16 ENCOUNTER — Other Ambulatory Visit (INDEPENDENT_AMBULATORY_CARE_PROVIDER_SITE_OTHER): Payer: 59

## 2011-04-16 ENCOUNTER — Encounter: Payer: Self-pay | Admitting: Neurology

## 2011-04-16 DIAGNOSIS — R51 Headache: Secondary | ICD-10-CM

## 2011-04-16 DIAGNOSIS — R413 Other amnesia: Secondary | ICD-10-CM

## 2011-04-16 DIAGNOSIS — G609 Hereditary and idiopathic neuropathy, unspecified: Secondary | ICD-10-CM

## 2011-04-16 DIAGNOSIS — R7309 Other abnormal glucose: Secondary | ICD-10-CM

## 2011-04-16 LAB — CBC WITH DIFFERENTIAL/PLATELET
Basophils Absolute: 0 10*3/uL (ref 0.0–0.1)
Basophils Relative: 0.5 % (ref 0.0–3.0)
Eosinophils Absolute: 0.2 10*3/uL (ref 0.0–0.7)
Eosinophils Relative: 2.5 % (ref 0.0–5.0)
HCT: 37.3 % (ref 36.0–46.0)
Hemoglobin: 12.6 g/dL (ref 12.0–15.0)
Lymphocytes Relative: 22.5 % (ref 12.0–46.0)
Lymphs Abs: 2 10*3/uL (ref 0.7–4.0)
MCHC: 33.7 g/dL (ref 30.0–36.0)
MCV: 93.3 fl (ref 78.0–100.0)
Monocytes Absolute: 0.7 10*3/uL (ref 0.1–1.0)
Monocytes Relative: 7.4 % (ref 3.0–12.0)
Neutro Abs: 6 10*3/uL (ref 1.4–7.7)
Neutrophils Relative %: 67.1 % (ref 43.0–77.0)
Platelets: 226 10*3/uL (ref 150.0–400.0)
RBC: 4 Mil/uL (ref 3.87–5.11)
RDW: 13.1 % (ref 11.5–14.6)
WBC: 9 10*3/uL (ref 4.5–10.5)

## 2011-04-16 LAB — COMPREHENSIVE METABOLIC PANEL
ALT: 17 U/L (ref 0–35)
AST: 19 U/L (ref 0–37)
Albumin: 3.9 g/dL (ref 3.5–5.2)
Alkaline Phosphatase: 62 U/L (ref 39–117)
BUN: 15 mg/dL (ref 6–23)
CO2: 30 mEq/L (ref 19–32)
Calcium: 9.6 mg/dL (ref 8.4–10.5)
Chloride: 103 mEq/L (ref 96–112)
Creatinine, Ser: 0.6 mg/dL (ref 0.4–1.2)
GFR: 115.01 mL/min (ref >60.00)
Glucose, Bld: 100 mg/dL — ABNORMAL HIGH (ref 70–99)
Potassium: 3.8 mEq/L (ref 3.5–5.1)
Sodium: 140 mEq/L (ref 135–145)
Total Bilirubin: 0.4 mg/dL (ref 0.3–1.2)
Total Protein: 6.2 g/dL (ref 6.0–8.3)

## 2011-04-16 LAB — TSH: TSH: 0.64 u[IU]/mL (ref 0.35–5.50)

## 2011-04-16 LAB — HEMOGLOBIN A1C: Hgb A1c MFr Bld: 5.8 % (ref 4.6–6.5)

## 2011-04-16 LAB — SEDIMENTATION RATE: Sed Rate: 14 mm/hr (ref 0–22)

## 2011-04-16 LAB — VITAMIN B12: Vitamin B-12: >1500 pg/mL — ABNORMAL HIGH (ref 211–911)

## 2011-04-16 NOTE — Patient Instructions (Signed)
Go to the basement to have your labs drawn today.  Your MRI has been scheduled for Friday, Sept. 28th at [facility] at 8:00am.  Please arrive by 7:45am.  Your EEG will be on the same day at 9:00am.  We will call you with the appointment information for the memory testing.

## 2011-04-16 NOTE — Progress Notes (Signed)
Dear Dr. Dayton Martes,  Thank you for having me see [name] in consultation today at [facility] for her problem with falls, difficulty with gait, headaches and loss of taste and smell.  As you may recall, she is a 65 y.o. year old RHD female with a history of Bell's Palsy who over the last 5-6 years has continued to have between 5 and 6 falls per year.  She denies loss of consciousness with the falls and her daughter who has witnessed some of them says that does not appear to lose consciousness.  She does not know why she falls, but says that sometimes she stumbles.  She can fall forward or backwards.  She does have a mild tremor at times.  She doesn't like to walk down stairs.  She denies any significant change in her bladder continence, she wears a pad, but has an accident perhaps 3-4 times per year.  She denies significant orthostasis.    Her daughter says that she will frequent forget a conversation that just occurred the day before.  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  She does suffer from headaches.  These occur every day, and have been doing so for the last several months.  She uses OTC medication every day.  The patient says that she has lost her sense of taste over the last year.  She first lost her sense of smell.  There is no history of head trauma.  She can till taste sweet and salty items.  She has had one fall that resulted in broken leg several years ago.  Medical History:  1.  Depression and anxiety after her husband died 4 years ago 2.  HLD 3.  Bell's Palsy x 2 in the 60s and 80s 4.  No history of head trauma, birth trauma or meningitis.   Surgical History:  1.  Hysterectomy as well as bladder "tack" 2.  Lap band surgery 3.  Cataract surgery  Social History: No tob, no EtOH lives with her daughter.  Family History: No neurologic disease.   ROS:  13 systems were reviewed and are notable for memory problems, weakness  in the arms and legs.  All other review of systems are unremarkable.   Examination:  Filed Vitals:   04/16/11 1346  BP: 100/64  Pulse: 72  Weight: 160 lb (72.576 kg)     In general, she is a well nourished appearing woman in NAD.  Cardiovascular: The patient has a regular rate and rhythm and no carotid bruits.  Fundoscopy:  Disks are flat. Vessel caliber within normal limits.  Mental status:   The patient is oriented to person, place and time. Recent and remote memory are intact. Attention span and concentration are normal. Language including repetition, naming, following commands are intact. Fund of knowledge of current and historical events, as well as vocabulary are normal.  Cranial Nerves: Pupils are equally round and reactive to light. Visual fields full to confrontation. Extraocular movements are intact without nystagmus. There may be some slowness of saccades on downward gaze.  She has decreased blink frequency.  Facial sensation and muscles of mastication are intact. Muscles of facial expression reveal LMN weakness on left, with synkinesis.  They left eye closes fully.   Hearing intact to bilateral finger rub. Tongue protrusion, uvula, palate midline.  Shoulder shrug intact.  No significant neck rigidity.  No blink initiation of saccades.    Motor:  The patient has normal  bulk and tone, no pronator drift and 5/5 strength bilaterally.  There is a mild postural tremor.  Reflexes:  Are 3+ bilaterally in both the upper and lower extremities.  Toes are down.  Coordination:  Normal finger to nose. Normal H2S   Sensation is intact to temperature and vibration.  Position sense intact.  Gait is slightly wide based, seems tentative with reduce arm swing.  Excellent correction for retropulsion. Romberg -ve.  MRI brain was reviewed and was remarkable for prominent atrophy and moderate white matter disease.  No definite "hummingbird" sign or severe ventriculomegaly.   Impression: I  believe that the patients falls and gait disorder are interconnected.  I think it is unlikely that the falls are from a different phenomenon such as seizure.  Part of her presentation looks Parkinsonian, and certainly progressive supranuclear palsy comes to mind but I think this is less likely given her excellent postural reflexes.  NPH is another consideration but the MRI does not seem to corroborate such a diagnosis.  She does have signs of a neuropathy on exam, but I am not convinced this explains her gait disorder.   Recommendations: I am going to get a peripheral neuropathy labs, and a repeat MRI of her brain to see if there are any additional signs of neurodegeneration, particularly in her midbrain.  I am also going to get an EEG.  I am not sure if I would put her on on an AED if it showed IEDs but perhaps with a better story of seizures causing the falls I might be pushed that way.   Finally, I would like to have her see neuropsychology to get a baseline cognitive test.  We may try some Sinemet at her next visit to see if this helps her gait, but I am not optimistic that this will help.     We will see the patient back in 6 weeks.  Thank you for having [place] [name] in consultation.  Feel free to contact me with any questions.  Lupita Raider Modesto Charon, MD [facility], [place] [address] [place], [place] [address] Phone: [phone] Fax: [phone].

## 2011-04-17 LAB — C-REACTIVE PROTEIN: CRP: 0.24 mg/dL (ref <0.60)

## 2011-04-20 LAB — PROTEIN ELECTROPHORESIS, SERUM
Albumin ELP: 61.9 % (ref 55.8–66.1)
Alpha-1-Globulin: 5.2 % — ABNORMAL HIGH (ref 2.9–4.9)
Alpha-2-Globulin: 13 % — ABNORMAL HIGH (ref 7.1–11.8)
Beta 2: 4 % (ref 3.2–6.5)
Beta Globulin: 7.1 % (ref 4.7–7.2)
Gamma Globulin: 8.8 % — ABNORMAL LOW (ref 11.1–18.8)
Total Protein, Serum Electrophoresis: 6 g/dL (ref 6.0–8.3)

## 2011-04-22 ENCOUNTER — Encounter: Payer: Self-pay | Admitting: Neurology

## 2011-04-22 LAB — METHYLMALONIC ACID, SERUM: Methylmalonic Acid, Quantitative: 137 nmol/L (ref 87–318)

## 2011-04-23 ENCOUNTER — Ambulatory Visit (HOSPITAL_COMMUNITY)
Admission: RE | Admit: 2011-04-23 | Discharge: 2011-04-23 | Disposition: A | Discharge status: Home / Self Care | Payer: 59 | Source: Ambulatory Visit | Attending: Neurology | Admitting: Neurology

## 2011-04-23 DIAGNOSIS — G20A1 Parkinson's disease without dyskinesia, without mention of fluctuations: Secondary | ICD-10-CM | POA: Insufficient documentation

## 2011-04-23 DIAGNOSIS — R413 Other amnesia: Secondary | ICD-10-CM

## 2011-04-23 DIAGNOSIS — G319 Degenerative disease of nervous system, unspecified: Secondary | ICD-10-CM | POA: Insufficient documentation

## 2011-04-23 DIAGNOSIS — R569 Unspecified convulsions: Secondary | ICD-10-CM

## 2011-04-23 DIAGNOSIS — G609 Hereditary and idiopathic neuropathy, unspecified: Secondary | ICD-10-CM

## 2011-04-23 DIAGNOSIS — G20 Parkinson's disease: Secondary | ICD-10-CM | POA: Insufficient documentation

## 2011-04-23 DIAGNOSIS — R51 Headache: Secondary | ICD-10-CM

## 2011-04-26 NOTE — Procedures (Signed)
EEG NUMBER:  [id number].  This routine EEG was requested in this 65 year old female with a history of spell to see if these may be related to seizures.  MEDICATIONS:  Alprazolam and fluoxetine.  The EEG was done with the patient awake, drowsy, and asleep.  During periods of maximal wakefulness.  There was a poorly regulated, poorly sustained 9 cycle per second alpha rhythm.  Background activities were characterized by low-to-medium amplitude poorly organized beta activities that were symmetric.  These did appear to be more prominent in the parasagittal region.  Photic stimulation produced a symmetric driving response. Hypoventilation for 3 minutes with good effort did not markedly change the tracing.  The patient became drowsy as evidenced by attenuation of muscle activity, quiescence of the posterior dominant rhythm in the onset of roving eye movements.  Furthermore, there was the intrusion of diffuse theta activity and some enhancement of the beta activities already seen. Stage 2 sleep was reached characterized by symmetric vertex sharp waves and sleep spindles.  CLINICAL INTERPRETATION:  This routine EEG done with the patient awake, drowsy, and sleep is normal.  The beta activities are too high in amplitude suggesting the use of benzodiazepine or barbiturate.          ______________________________ [name], MD    FA:OZHY D:  04/25/2011 21:19:12  T:  04/25/2011 22:17:00  Job #:  [id number]

## 2011-04-27 ENCOUNTER — Telehealth: Payer: Self-pay

## 2011-04-27 NOTE — Telephone Encounter (Signed)
Notified pt of appt with [facility] on 10/4 at 8:30am.

## 2011-04-28 NOTE — Progress Notes (Signed)
Reviewed results of MRI brain.  Significant atrophy, particularly of temporal lobes, both mesial and lateral, and ex vacuo changes.  I do not think she has the radiologic picture of NPH given the significant cortical atrophy.  Notably the atrophy does not seem to include the parietal, frontal or occipital lobes as much.  No putaminal rim hyperintensity, hummingbird or hot cross bun sign.

## 2011-05-07 ENCOUNTER — Telehealth: Payer: Self-pay | Admitting: Neurology

## 2011-05-09 NOTE — Progress Notes (Signed)
Reviewed neuropsych testing.  Significant dysfunction, dementia NOS.

## 2011-05-10 ENCOUNTER — Telehealth: Payer: Self-pay | Admitting: Neurology

## 2011-05-10 LAB — DIFFERENTIAL
Basophils Absolute: 0
Basophils Relative: 0
Eosinophils Absolute: 0
Eosinophils Relative: 0
Lymphocytes Relative: 14
Lymphs Abs: 1.3
Monocytes Absolute: 0.7
Monocytes Relative: 8
Neutro Abs: 7.2
Neutrophils Relative %: 77

## 2011-05-10 LAB — CBC
HCT: 30.1 — ABNORMAL LOW
Hemoglobin: 10.4 — ABNORMAL LOW
MCHC: 34.4
MCV: 90.2
Platelets: 270
RBC: 3.34 — ABNORMAL LOW
RDW: 15 — ABNORMAL HIGH
WBC: 9.3

## 2011-05-10 NOTE — Telephone Encounter (Signed)
Pt's emergency contact called to get results sent to [place] from [facility].

## 2011-05-11 LAB — DIFFERENTIAL
Basophils Absolute: 0
Basophils Relative: 1
Eosinophils Absolute: 0.1
Eosinophils Relative: 2
Lymphocytes Relative: 25
Lymphs Abs: 1.7
Monocytes Absolute: 0.6
Monocytes Relative: 8
Neutro Abs: 4.5
Neutrophils Relative %: 65

## 2011-05-11 LAB — CBC
HCT: 38.4
Hemoglobin: 13.2
MCHC: 34.4
MCV: 89.2
Platelets: 305
RBC: 4.3
RDW: 14.9 — ABNORMAL HIGH
WBC: 6.9

## 2011-05-11 LAB — BASIC METABOLIC PANEL
BUN: 20
CO2: 31
Calcium: 10.4
Chloride: 101
Creatinine, Ser: 0.61
GFR calc Af Amer: >60
GFR calc non Af Amer: >60
Glucose, Bld: 99
Potassium: 3.6
Sodium: 140

## 2011-05-11 LAB — URINALYSIS, ROUTINE W REFLEX MICROSCOPIC
Bilirubin Urine: NEGATIVE
Glucose, UA: NEGATIVE
Hgb urine dipstick: NEGATIVE
Ketones, ur: NEGATIVE
Nitrite: NEGATIVE
Protein, ur: NEGATIVE
Specific Gravity, Urine: 1.013
Urobilinogen, UA: 0.2
pH: 6.5

## 2011-05-11 NOTE — Telephone Encounter (Signed)
Pt's daughter called to check on results from [facility] and also to see if she needed a fu appt with [place].

## 2011-05-11 NOTE — Telephone Encounter (Signed)
gave daughter results.  please set ms. Bose up with a return appt, early November -- please set up with her daughterJackie Jeanella Anton  - tell her that I was mistaken when I said late November - had my dates confused.

## 2011-05-12 NOTE — Telephone Encounter (Signed)
Called pt's daughter to schedule appt. Left message on machine to call back.

## 2011-05-13 NOTE — Telephone Encounter (Signed)
Pt scheduled for 05/31/2011 at 2:30

## 2011-05-13 NOTE — Telephone Encounter (Signed)
thanks!!  M

## 2011-05-27 DIAGNOSIS — R51 Headache: Secondary | ICD-10-CM

## 2011-05-27 HISTORY — DX: Headache: R51

## 2011-05-30 ENCOUNTER — Emergency Department (HOSPITAL_COMMUNITY)
Admission: EM | Admit: 2011-05-30 | Discharge: 2011-05-30 | Disposition: A | Discharge status: Home / Self Care | Payer: 59 | Attending: Emergency Medicine | Admitting: Emergency Medicine

## 2011-05-30 DIAGNOSIS — N390 Urinary tract infection, site not specified: Secondary | ICD-10-CM | POA: Insufficient documentation

## 2011-05-30 DIAGNOSIS — E7800 Pure hypercholesterolemia, unspecified: Secondary | ICD-10-CM | POA: Insufficient documentation

## 2011-05-30 DIAGNOSIS — I10 Essential (primary) hypertension: Secondary | ICD-10-CM | POA: Insufficient documentation

## 2011-05-30 DIAGNOSIS — E876 Hypokalemia: Secondary | ICD-10-CM | POA: Insufficient documentation

## 2011-05-30 DIAGNOSIS — Z79899 Other long term (current) drug therapy: Secondary | ICD-10-CM | POA: Insufficient documentation

## 2011-05-30 DIAGNOSIS — R5381 Other malaise: Secondary | ICD-10-CM | POA: Insufficient documentation

## 2011-05-30 DIAGNOSIS — R5383 Other fatigue: Secondary | ICD-10-CM | POA: Insufficient documentation

## 2011-05-30 DIAGNOSIS — E119 Type 2 diabetes mellitus without complications: Secondary | ICD-10-CM | POA: Insufficient documentation

## 2011-05-30 LAB — BASIC METABOLIC PANEL
BUN: 16 mg/dL (ref 6–23)
CO2: 25 mEq/L (ref 19–32)
Calcium: 9.2 mg/dL (ref 8.4–10.5)
Chloride: 103 mEq/L (ref 96–112)
Creatinine, Ser: 0.62 mg/dL (ref 0.50–1.10)
GFR calc Af Amer: >90 mL/min (ref >90)
GFR calc non Af Amer: >90 mL/min (ref >90)
Glucose, Bld: 148 mg/dL — ABNORMAL HIGH (ref 70–99)
Potassium: 3.1 mEq/L — ABNORMAL LOW (ref 3.5–5.1)
Sodium: 138 mEq/L (ref 135–145)

## 2011-05-30 LAB — CBC
HCT: 33.7 % — ABNORMAL LOW (ref 36.0–46.0)
Hemoglobin: 11.7 g/dL — ABNORMAL LOW (ref 12.0–15.0)
MCH: 31.1 pg (ref 26.0–34.0)
MCHC: 34.7 g/dL (ref 30.0–36.0)
MCV: 89.6 fL (ref 78.0–100.0)
Platelets: 190 10*3/uL (ref 150–400)
RBC: 3.76 MIL/uL — ABNORMAL LOW (ref 3.87–5.11)
RDW: 12.3 % (ref 11.5–15.5)
WBC: 9.6 10*3/uL (ref 4.0–10.5)

## 2011-05-30 LAB — URINE MICROSCOPIC-ADD ON

## 2011-05-30 LAB — URINALYSIS, ROUTINE W REFLEX MICROSCOPIC
Bilirubin Urine: NEGATIVE
Glucose, UA: NEGATIVE mg/dL
Hgb urine dipstick: NEGATIVE
Ketones, ur: NEGATIVE mg/dL
Nitrite: NEGATIVE
Protein, ur: NEGATIVE mg/dL
Specific Gravity, Urine: 1.014 (ref 1.005–1.030)
Urobilinogen, UA: 0.2 mg/dL (ref 0.0–1.0)
pH: 6 (ref 5.0–8.0)

## 2011-05-30 LAB — DIFFERENTIAL
Basophils Absolute: 0 10*3/uL (ref 0.0–0.1)
Basophils Relative: 0 % (ref 0–1)
Eosinophils Absolute: 0.2 10*3/uL (ref 0.0–0.7)
Eosinophils Relative: 2 % (ref 0–5)
Lymphocytes Relative: 20 % (ref 12–46)
Lymphs Abs: 2 10*3/uL (ref 0.7–4.0)
Monocytes Absolute: 0.7 10*3/uL (ref 0.1–1.0)
Monocytes Relative: 7 % (ref 3–12)
Neutro Abs: 6.8 10*3/uL (ref 1.7–7.7)
Neutrophils Relative %: 71 % (ref 43–77)

## 2011-05-30 LAB — CK TOTAL AND CKMB (NOT AT ARMC)
CK, MB: 1.6 ng/mL (ref 0.3–4.0)
Relative Index: INVALID (ref 0.0–2.5)
Total CK: 28 U/L (ref 7–177)

## 2011-05-30 LAB — SAMPLE TO BLOOD BANK

## 2011-05-30 LAB — TROPONIN I: Troponin I: <0.3 ng/mL (ref <0.30)

## 2011-05-30 MED ORDER — NITROFURANTOIN MONOHYD MACRO 100 MG PO CAPS
100.0000 mg | ORAL_CAPSULE | Freq: Once | ORAL | Status: DC
  Filled 2011-05-30: qty 1

## 2011-05-31 ENCOUNTER — Encounter: Payer: Self-pay | Admitting: Neurology

## 2011-05-31 ENCOUNTER — Ambulatory Visit (INDEPENDENT_AMBULATORY_CARE_PROVIDER_SITE_OTHER): Payer: 59 | Admitting: Neurology

## 2011-05-31 VITALS — BP 170/98 | HR 56 | Wt 162.0 lb

## 2011-05-31 DIAGNOSIS — R29818 Other symptoms and signs involving the nervous system: Secondary | ICD-10-CM

## 2011-05-31 DIAGNOSIS — R2689 Other abnormalities of gait and mobility: Secondary | ICD-10-CM

## 2011-05-31 MED ORDER — CARBIDOPA-LEVODOPA 25-100 MG PO TABS: ORAL_TABLET | ORAL | Status: DC

## 2011-05-31 NOTE — Progress Notes (Signed)
Dear Dr. Dayton Martes,  I saw  [name] back in [facility] for her problem with gait and falls as well as memory problems and lost of smell and taste.  As you may recall, she is a 65 y.o. year old RHD  female with a history of Bell's palsy, anxiety and depression who has a history of 5-6 falls per year.  She also has had a progressive worsening of her gait as well as worsening memory problems.  She  denies change in bladder habits(although does wear a pad) or problems with orthostasis.  She has a mild tremor at times.  I was concerned about a Parkinson's plus syndrome and repeated an MRI of her brain.  It revealed significant atrophy of the mesial and lateral temporal lobes.  It was not consistent with NPH.  There were no changes indicative of a Parkinson's plus syndrome.  In addition, I got memory testing which revealed significant global cognitive dysfunction surprisingly without significant anxiety and depression.  They felt she likely had a neurodegenerative disorder.  EEG was unremarkable.  She reports no further falls since I saw her last.  However, she had a recent episode where she was "dehydrated" which resulted in a trip to the ED.  Her walking continues to be compromised with chronic imbalance.  With respect to her her mood she denies depression.  However, she feels constantly tired and has had problems with falling asleep at work.  She does not want to go out anymore.  She feels things have been worse since her husband died 4 years ago.  Her appetite is decreased as she only wants to eat sweet foods attributing this to her lack of taste or smell.  She was started on fluoxetine in the march April time frame, but has been on Xanax chronicaly.  Medical history, social history, family history, medications and allergies were reviewed and have not changed since the last clinic vist.  Meds include: 1.  Xanax 0.20m bid 2.  Fluoxetine 20mg  daily  ROS:  13 systems were reviewed and  are notable for chronic stress incontinence, unchanged.  Chronic headaches, problems with memory, blurred vision and weakness in the arms and legs. Patient reports a festinating gait at times with difficulty stopping.  ll other review of systems are unremarkable.  Exam: . Filed Vitals:   05/31/11 1419  BP: 170/98  Pulse: 56  Weight: 162 lb (73.483 kg)    In general, older woman with flat affect.   Cranial Nerves:  Extraocular movements are full but she has difficulty with downward saccades.  Decreased blink freq.  Left LMN facial weakness.   Facial sensation and muscles of mastication are intact.  Tongue protrusion, uvula, palate midline.  Shoulder shrug intact  Motor:  Mildly increased neck tone.  No cogwheeling.  Appendicular tone mildly increased, with decreased amplitude of FFM bilaterally with no asymmetry.  Strength normal bilaterally.  No spasticity when lying.  Fine postural tremor right > left.  Reflexes:  Brisk throughout, with +- jaw jerk.  Toes down.  Coordination:  Normal finger to nose  Gait:  Narrow based gait, reduced arm swing.  Impression:  I am concerned about a neurodegenerative disease, resulting in her parkinsonian gait, taste and smell loss as well as her memory difficulties.  It also could be the cause of her worsening depression(as diagnosed by geriatric depression screen).  Recommendations: 1.  Gait Disorder - I am going to try her on a trial of Sinemet 100/25  to increase to 1 tab tid.  I am not convinced she has Parkinson's disease, but sometimes Sinemet can help other parkinson's like gait disorders.  I am also going to refer her to the gait and balance clinic. 2.  Depression - I think she has significant depression.  I would recommend that you consider increased her fluoxetine or perhaps referring her to psychiatry.   3.  Fatigue - I think this is from her depression.  However, taking her off her Xanax may be worthwhile in the future to help her  fatigue.  We will see the patient back in 6 weeks.  Lupita Raider Modesto Charon, MD [facility], [place]

## 2011-05-31 NOTE — Patient Instructions (Addendum)
Sinemet 100/25 mg tabs   Take 0.5 tabs at 7 a.m. for 5 days  then take 0.5 tabs at 7 and 12 am. for 5 days,  then take 0.5 tabs at 7, 12 and 5 p.m for 5 days,  then take 1 tab at 7 am. and 0.5 tabs at 12 and 5 pm for 5 days  then take 1 tab at 7 am. and 12 pm. and 0.5 tabs at 5 pm for 5 days  then take 1 tab at 7 a.m., 12 p.m. and 5 pm. from then on.  [facility] will be calling you to set up the PT appointments.

## 2011-06-01 LAB — URINE CULTURE
Colony Count: >=100000
Culture  Setup Time: 201211041147

## 2011-06-02 ENCOUNTER — Other Ambulatory Visit: Payer: Self-pay | Admitting: Family Medicine

## 2011-06-02 NOTE — Telephone Encounter (Signed)
Rx's refilled electronically.

## 2011-06-02 NOTE — ED Notes (Signed)
+   Urine Chart sent to EDP office for review. 

## 2011-06-07 ENCOUNTER — Ambulatory Visit (INDEPENDENT_AMBULATORY_CARE_PROVIDER_SITE_OTHER): Payer: 59 | Admitting: Family Medicine

## 2011-06-07 ENCOUNTER — Encounter: Payer: Self-pay | Admitting: Family Medicine

## 2011-06-07 VITALS — BP 112/70 | HR 60 | Temp 97.4°F | Ht 62.0 in | Wt 155.5 lb

## 2011-06-07 DIAGNOSIS — N390 Urinary tract infection, site not specified: Secondary | ICD-10-CM | POA: Insufficient documentation

## 2011-06-07 LAB — POCT URINALYSIS DIPSTICK
Bilirubin, UA: NEGATIVE
Blood, UA: NEGATIVE
Glucose, UA: NEGATIVE
Ketones, UA: NEGATIVE
Nitrite, UA: NEGATIVE
Spec Grav, UA: <=1.005
Urobilinogen, UA: 0.2
pH, UA: 6.5

## 2011-06-07 NOTE — Progress Notes (Signed)
  Subjective:    Patient ID: [name], female    DOB: 02-May-1946, 65 y.o.   MRN: [id number]  HPI  65 yo here for ER follow up. Notes reviewed. Went to [facility] on 11/4 with complaint of weakness. Of note, being followed by neuro, Dr. Modesto Charon, for possible neurodegenerative disease. Started on Sinemet after this ER visit.  EKG unchanged. CBC unchanged with mild anemia. BMET unremarkable. CE neg x 3 UA positive Mod LE and many bacteria.  Sent home with Macrobid 100 mg twice daily x 7 days.  Never had any dysuria, back pain or suprapubic tenderness. No fever, chills, nausea or vomiting.  Still weak but she is attributing to her neurodegenerative disease.  UA pos for trace LE.  Review of Systems See HPI    Objective:   Physical Exam BP 112/70  Pulse 60  Temp(Src) 97.4 F (36.3 C) (Oral)  Ht 5\' 2"  (1.575 m)  Wt 155 lb 8 oz (70.534 kg)  BMI 28.44 kg/m2  General: Well-developed,well-nourished,in no acute distress; alert,appropriate and cooperative throughout examination, minimally overweight.  Mouth: Oral mucosa and oropharynx without lesions or exudates. Teeth in good repair. Left facial paralysis unchanged.  Neck: No deformities, masses, or tenderness noted.  No bruits  Lungs: Normal respiratory effort, chest expands symmetrically. Lungs are clear to auscultation, no crackles or wheezes.  Heart: Normal rate and regular rhythm. S1 and S2 normal without gallop, murmur, click, rub or other extra sounds.  Abdomen: Bowel sounds positive,abdomen soft and non-tender without masses, organomegaly or hernias noted. NO CVA or suprapubic tenderness.  Extremities: No clubbing, cyanosis, edema, or deformity noted with normal full range of motion of all joints.  Neurologic: No cranial nerve deficits noted. Station and gait are normal. Sensory, motor and coordinative functions appear intact.  Psych: Cognition and judgment appear intact. Alert and cooperative with normal attention span and  concentration. No apparent delusions, illusions, hallucinations     Assessment & Plan:   1. UTI (lower urinary tract infection)   Improved. Trace LE on UA. Will send for cx, no further abx at this time. Remains asymptomatic.

## 2011-06-09 LAB — URINE CULTURE: Colony Count: 4000

## 2011-06-09 MED ORDER — FLUOXETINE HCL 20 MG PO TABS: ORAL_TABLET | ORAL | Status: DC

## 2011-06-09 NOTE — Progress Notes (Signed)
Addended by: Dianne Dun on: 06/09/2011 12:10 PM   Modules accepted: Orders

## 2011-06-10 ENCOUNTER — Other Ambulatory Visit: Payer: Self-pay | Admitting: *Deleted

## 2011-06-10 MED ORDER — FLUOXETINE HCL 20 MG PO TABS: ORAL_TABLET | ORAL | Status: DC

## 2011-06-10 NOTE — Telephone Encounter (Signed)
Rx sent to pharmacy electronically.  Patient notified via telephone.

## 2011-07-21 ENCOUNTER — Telehealth (INDEPENDENT_AMBULATORY_CARE_PROVIDER_SITE_OTHER): Payer: 59 | Admitting: Internal Medicine

## 2011-07-21 DIAGNOSIS — R82998 Other abnormal findings in urine: Secondary | ICD-10-CM

## 2011-07-21 DIAGNOSIS — R8290 Unspecified abnormal findings in urine: Secondary | ICD-10-CM

## 2011-07-21 LAB — POCT URINALYSIS DIPSTICK
Bilirubin, UA: NEGATIVE
Blood, UA: NEGATIVE
Glucose, UA: NEGATIVE
Ketones, UA: NEGATIVE
Leukocytes, UA: NEGATIVE
Nitrite, UA: POSITIVE
Protein, UA: NEGATIVE
Spec Grav, UA: 1.01
Urobilinogen, UA: 0.2
pH, UA: 7.5

## 2011-07-21 MED ORDER — CIPROFLOXACIN HCL 500 MG PO TABS: 500.0000 mg | ORAL_TABLET | Freq: Two times a day (BID) | ORAL | Status: DC

## 2011-07-21 NOTE — Telephone Encounter (Signed)
[name] thinks the patient is getting another UTI, because she is having a odor.  She can't bring her in because she can't walk.  No burning or itching.   [phone]

## 2011-07-21 NOTE — Telephone Encounter (Signed)
rx for cipro sent to [facility]

## 2011-07-21 NOTE — Telephone Encounter (Signed)
Urine dipped, positive nitrates please advise.

## 2011-07-21 NOTE — Telephone Encounter (Signed)
Ms. Jeanella Anton advised that Rx sent to [facility].

## 2011-07-21 NOTE — Telephone Encounter (Signed)
We usually do not treat UTIs based on odor alone, especially without UA. Can she bring in a urine sample?

## 2011-07-21 NOTE — Telephone Encounter (Signed)
Addended by: Gilmer Mor on: 07/21/2011 02:12 PM   Modules accepted: Orders

## 2011-07-21 NOTE — Telephone Encounter (Signed)
Spoke with [name] and she will try to get a urine sample and bring it in today.

## 2011-07-21 NOTE — Telephone Encounter (Signed)
Addended by: Dianne Dun on: 07/21/2011 02:20 PM   Modules accepted: Orders

## 2011-08-04 ENCOUNTER — Telehealth (INDEPENDENT_AMBULATORY_CARE_PROVIDER_SITE_OTHER): Payer: 59 | Admitting: Internal Medicine

## 2011-08-04 DIAGNOSIS — R300 Dysuria: Secondary | ICD-10-CM

## 2011-08-04 NOTE — Telephone Encounter (Signed)
Unfortunately, we need to check a UA first before I can call something in.

## 2011-08-04 NOTE — Telephone Encounter (Signed)
Patient advised as instructed via telephone.  She stated that she will get her daughter to bring in an urine sample because she doesn't feel up to coming in.

## 2011-08-04 NOTE — Telephone Encounter (Signed)
Would you like patient to be seen or can a urine sample be brought in?  Please advise.

## 2011-08-04 NOTE — Telephone Encounter (Signed)
Patient called and stated she has another UTI.  She is burning when she urinates and pain in lower back.  She would like to know if you would call her in something like last time so her daughter can pick it up after she gets off of work.  [facility] on [address].

## 2011-08-04 NOTE — Telephone Encounter (Signed)
I think the policy won't allow her to bring in a UA although I'm ok with that since we are so overbooked. If we cannot just have her do a UA without being seen, ok to double book me (sooner rather than later since I have an admission at [facility]) Thanks

## 2011-08-04 NOTE — Telephone Encounter (Signed)
Patient called and spoke with Pam to let us know that she will have her daughter bring in the urine specimen tomorrow because her daughter can't get off work today and patient does not drive.

## 2011-08-05 LAB — POCT URINALYSIS DIPSTICK
Bilirubin, UA: NEGATIVE
Blood, UA: NEGATIVE
Glucose, UA: NEGATIVE
Ketones, UA: NEGATIVE
Leukocytes, UA: NEGATIVE
Nitrite, UA: NEGATIVE
Protein, UA: NEGATIVE
Spec Grav, UA: <=1.005
Urobilinogen, UA: 0.2
pH, UA: 6.5

## 2011-08-05 NOTE — Telephone Encounter (Signed)
Patient advised as instructed via telephone. 

## 2011-08-05 NOTE — Telephone Encounter (Signed)
Urine checked and it was completely clear.  Please advise.

## 2011-08-05 NOTE — Telephone Encounter (Signed)
Addended by: Gilmer Mor on: 08/05/2011 09:20 AM   Modules accepted: Orders

## 2011-08-05 NOTE — Telephone Encounter (Signed)
Keep drinking plenty of fluids.  If symptoms persist, schedule appt to see me.

## 2011-08-10 ENCOUNTER — Ambulatory Visit (INDEPENDENT_AMBULATORY_CARE_PROVIDER_SITE_OTHER): Payer: 59 | Admitting: Neurology

## 2011-08-10 ENCOUNTER — Encounter: Payer: Self-pay | Admitting: Neurology

## 2011-08-10 ENCOUNTER — Other Ambulatory Visit: Payer: Self-pay | Admitting: Internal Medicine

## 2011-08-10 VITALS — BP 128/80 | HR 76 | Ht 62.0 in | Wt 158.0 lb

## 2011-08-10 DIAGNOSIS — R269 Unspecified abnormalities of gait and mobility: Secondary | ICD-10-CM

## 2011-08-10 MED ORDER — CARBIDOPA-LEVODOPA 25-100 MG PO TABS: ORAL_TABLET | ORAL | Status: DC

## 2011-08-10 MED ORDER — ALPRAZOLAM 0.5 MG PO TABS: 0.5000 mg | ORAL_TABLET | Freq: Two times a day (BID) | ORAL | Status: DC

## 2011-08-10 NOTE — Patient Instructions (Signed)
increase Sinemet to 1 tab in the am, 1 tab at noon and 2 tabs at night for 1 week, then increase to 1 tab in the am., 2 at noon, 2 at night for 1 week, then increase to 2 tabs three times a day from then on.

## 2011-08-10 NOTE — Telephone Encounter (Signed)
Patient requesting refill. 

## 2011-08-10 NOTE — Progress Notes (Signed)
Dear Dr. Dayton Martes,  I saw  [name] back in [facility] for her problem with gait disturbance and falls.  As you may recall, she is a 66 y.o. 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  She thinks that her falls have gotten some what better.  However, she is not sure the medication has helped.  She continues to complain of significant memory problems and according to her daughter has started to become paranoid - saying that people are talking about her.  She has had two falls since I saw her last, both backwards.  She does have some head pain after the falls.  She did have a bladder infection recently and the falls occurred during that time.  Medical history, social history, and family history were reviewed and have not changed since the last clinic visit.  Current Outpatient Prescriptions on File Prior to Visit  Medication Sig Dispense Refill  . ALPRAZolam (XANAX) 0.5 MG tablet Take 1 tablet (0.5 mg total) by mouth 2 (two) times daily.  180 tablet  3  . aspirin 81 MG tablet Take 81 mg by mouth daily.        . Calcium Carbonate-Vitamin D (CALTRATE 600+D) 600-400 MG-UNIT per chew tablet Chew 2 tablets by mouth daily.        . cetirizine (ZYRTEC) 10 MG tablet Take 10 mg by mouth at bedtime.        . Cholecalciferol (VITAMIN D3) 1000 UNITS CAPS Take 1 capsule by mouth daily.        Marland Kitchen FLUoxetine (PROZAC) 20 MG tablet 2 tabs by mouth daily.  30 tablet  6  . losartan-hydrochlorothiazide (HYZAAR) 50-12.5 MG per tablet TAKE 1 TABLET DAILY  90 tablet  3  . Multiple Vitamins-Minerals (CENTRUM SILVER PO) Take 1 tablet by mouth daily.        . simvastatin (ZOCOR) 80 MG tablet Take 1 tablet (80 mg total) by mouth at bedtime.  90 tablet  3  . vitamin B-12 (CYANOCOBALAMIN) 1000 MCG tablet Take 1,000 mcg by mouth daily.          Allergies  Allergen Reactions  . Codeine Sulfate     REACTION: hallucinations  .  Penicillins     REACTION: rash    ROS:  13 systems were reviewed and are notable for paranoia as mentioned above..  All other review of systems are unremarkable.  Exam: . Filed Vitals:   08/10/11 1018  BP: 128/80  Pulse: 76  Height: 5\' 2"  (1.575 m)  Weight: 158 lb (71.668 kg)    In general, masked facies.   Cranial Nerves: Extraocular movements reveal slowed downward saccades. Facial sensation and muscles of mastication are intact. Muscles of facial expression are symmetric but blink frequency is decreased. Hearing intact to bilateral finger rub. Tongue protrusion, uvula, palate midline.  Shoulder shrug intact  Motor:  Mild rigidity and cogwheeling right greater than left.  Bradykinesia bilaterally.  Reflexes:  Symmetric  Coordination:  Normal finger to nose  Gait:  Slow, smallish stride, turns well.  Decent postural reflexes with mild retropulsion.  Impression:  Parkinsonism - would include LBD as one of my chief diagnoses.  Recommendations: 1.  Parkinsonism - We will increase her Sinemet to two tabs (200/50) tid.  I will be mindful to watch if this worsens paranoia. 2.  Memory problems - Will consider cholinergic agent at later date. 3.  Paranoia-  unsure if this is medication side effect or underlying disease. 4.  Gait problems and falls - have referred her to the parkinson's clinic for rehab.  We will see the patient back in 2 months.  Lupita Raider Modesto Charon, MD [facility], [place]

## 2011-08-18 MED ORDER — ALPRAZOLAM 0.5 MG PO TABS: 0.5000 mg | ORAL_TABLET | Freq: Two times a day (BID) | ORAL | Status: DC

## 2011-08-18 NOTE — Telephone Encounter (Signed)
Patient called because she did not receive her mailed order Rx for Alprazolam.  Rx was approved by Dr. Dayton Martes but was never called in.  I called patient to let her know that Rx was faxed to [facility] today.

## 2011-08-18 NOTE — Telephone Encounter (Signed)
Addended by: Gilmer Mor on: 08/18/2011 12:29 PM   Modules accepted: Orders

## 2011-08-24 ENCOUNTER — Ambulatory Visit: Payer: 59 | Attending: Neurology | Admitting: Physical Therapy

## 2011-08-30 ENCOUNTER — Ambulatory Visit: Payer: 59 | Attending: Neurology | Admitting: Physical Therapy

## 2011-08-30 DIAGNOSIS — IMO0001 Reserved for inherently not codable concepts without codable children: Secondary | ICD-10-CM | POA: Insufficient documentation

## 2011-08-30 DIAGNOSIS — R269 Unspecified abnormalities of gait and mobility: Secondary | ICD-10-CM | POA: Insufficient documentation

## 2011-09-03 ENCOUNTER — Ambulatory Visit: Payer: 59 | Admitting: Physical Therapy

## 2011-09-06 ENCOUNTER — Ambulatory Visit: Payer: 59 | Admitting: Physical Therapy

## 2011-09-08 ENCOUNTER — Ambulatory Visit: Payer: 59 | Admitting: Physical Therapy

## 2011-09-13 ENCOUNTER — Other Ambulatory Visit: Payer: Self-pay | Admitting: Family Medicine

## 2011-09-13 DIAGNOSIS — Z79899 Other long term (current) drug therapy: Secondary | ICD-10-CM

## 2011-09-13 DIAGNOSIS — E782 Mixed hyperlipidemia: Secondary | ICD-10-CM

## 2011-09-14 ENCOUNTER — Other Ambulatory Visit (INDEPENDENT_AMBULATORY_CARE_PROVIDER_SITE_OTHER): Payer: 59

## 2011-09-14 DIAGNOSIS — E782 Mixed hyperlipidemia: Secondary | ICD-10-CM

## 2011-09-14 DIAGNOSIS — Z79899 Other long term (current) drug therapy: Secondary | ICD-10-CM

## 2011-09-14 LAB — COMPREHENSIVE METABOLIC PANEL
ALT: 14 U/L (ref 0–35)
AST: 19 U/L (ref 0–37)
Albumin: 3.8 g/dL (ref 3.5–5.2)
Alkaline Phosphatase: 54 U/L (ref 39–117)
BUN: 22 mg/dL (ref 6–23)
CO2: 27 mEq/L (ref 19–32)
Calcium: 9.9 mg/dL (ref 8.4–10.5)
Chloride: 101 mEq/L (ref 96–112)
Creatinine, Ser: 0.7 mg/dL (ref 0.4–1.2)
GFR: 91.81 mL/min (ref >60.00)
Glucose, Bld: 77 mg/dL (ref 70–99)
Potassium: 3.7 mEq/L (ref 3.5–5.1)
Sodium: 139 mEq/L (ref 135–145)
Total Bilirubin: 0.5 mg/dL (ref 0.3–1.2)
Total Protein: 6.3 g/dL (ref 6.0–8.3)

## 2011-09-14 LAB — CBC WITH DIFFERENTIAL/PLATELET
Basophils Absolute: 0 10*3/uL (ref 0.0–0.1)
Basophils Relative: 0.6 % (ref 0.0–3.0)
Eosinophils Absolute: 0.3 10*3/uL (ref 0.0–0.7)
Eosinophils Relative: 3.4 % (ref 0.0–5.0)
HCT: 39.1 % (ref 36.0–46.0)
Hemoglobin: 13.1 g/dL (ref 12.0–15.0)
Lymphocytes Relative: 29.9 % (ref 12.0–46.0)
Lymphs Abs: 2.2 10*3/uL (ref 0.7–4.0)
MCHC: 33.4 g/dL (ref 30.0–36.0)
MCV: 93.7 fl (ref 78.0–100.0)
Monocytes Absolute: 0.6 10*3/uL (ref 0.1–1.0)
Monocytes Relative: 7.8 % (ref 3.0–12.0)
Neutro Abs: 4.3 10*3/uL (ref 1.4–7.7)
Neutrophils Relative %: 58.3 % (ref 43.0–77.0)
Platelets: 208 10*3/uL (ref 150.0–400.0)
RBC: 4.18 Mil/uL (ref 3.87–5.11)
RDW: 13.8 % (ref 11.5–14.6)
WBC: 7.3 10*3/uL (ref 4.5–10.5)

## 2011-09-14 LAB — LIPID PANEL
Cholesterol: 132 mg/dL (ref 0–200)
HDL: 61.6 mg/dL (ref >39.00)
LDL Cholesterol: 42 mg/dL (ref 0–99)
Total CHOL/HDL Ratio: 2
Triglycerides: 140 mg/dL (ref 0.0–149.0)
VLDL: 28 mg/dL (ref 0.0–40.0)

## 2011-09-14 LAB — TSH: TSH: 1.34 u[IU]/mL (ref 0.35–5.50)

## 2011-09-15 ENCOUNTER — Ambulatory Visit: Payer: 59 | Admitting: Physical Therapy

## 2011-09-17 ENCOUNTER — Ambulatory Visit: Payer: 59 | Admitting: Physical Therapy

## 2011-09-20 ENCOUNTER — Ambulatory Visit: Payer: 59 | Admitting: Physical Therapy

## 2011-09-21 ENCOUNTER — Ambulatory Visit (INDEPENDENT_AMBULATORY_CARE_PROVIDER_SITE_OTHER): Payer: 59 | Admitting: Family Medicine

## 2011-09-21 ENCOUNTER — Encounter: Payer: Self-pay | Admitting: Family Medicine

## 2011-09-21 VITALS — BP 120/80 | HR 84 | Temp 98.1°F | Wt 159.0 lb

## 2011-09-21 DIAGNOSIS — Z0000 Encounter for general adult medical examination without abnormal findings: Secondary | ICD-10-CM

## 2011-09-21 DIAGNOSIS — N390 Urinary tract infection, site not specified: Secondary | ICD-10-CM

## 2011-09-21 DIAGNOSIS — Z1211 Encounter for screening for malignant neoplasm of colon: Secondary | ICD-10-CM

## 2011-09-21 LAB — POCT URINALYSIS DIPSTICK
Bilirubin, UA: NEGATIVE
Blood, UA: NEGATIVE
Glucose, UA: NEGATIVE
Ketones, UA: NEGATIVE
Leukocytes, UA: NEGATIVE
Nitrite, UA: NEGATIVE
Protein, UA: NEGATIVE
Spec Grav, UA: <=1.005
Urobilinogen, UA: NEGATIVE
pH, UA: 7.5

## 2011-09-21 NOTE — Patient Instructions (Signed)
Great to see you. Please hang in there.  Let me know how I can help.

## 2011-09-21 NOTE — Progress Notes (Signed)
Subjective:    Patient ID: [name], female    DOB: 05/20/1946, 66 y.o.   MRN: [id number]  HPI  66 yo here AMW.  Daughter has noticed odor in urine. H/o recurrent UTIs, usually asymptomatic other than foul odor in urine. No back pain, fevers, nausea or vomiting.   I have personally reviewed the Medicare Annual Wellness questionnaire and have noted 1. The patient's medical and social history 2. Their use of alcohol, tobacco or illicit drugs 3. Their current medications and supplements 4. The patient's functional ability including ADL's, fall risks, home safety risks and hearing or visual             impairment. 5. Diet and physical activities 6. Evidence for depression or mood disorders  Weakness- being followed by neuro, Dr. Modesto Charon, for possible neurodegenerative disease. Started on Sinemet after last ER visit. Has follow up with Dr. Modesto Charon next month. Doing well with PT- feels a little stronger.  Depression- still does not want to go back to see Dr. Laymond Purser.  Feels like she is fine on Prozac 10 mg daily. Loves going to work.  Patient Active Problem List  Diagnoses  . AODM  . HYPERLIPIDEMIA, MIXED  . Dysmetabolic syndrome X  . OBESITY, MORBID  . ANXIETY DEPRESSION  . GRIEF REACTION  . DISORDER, DEPRESSIVE NEC  . HYPERTENSION, BENIGN ESSENTIAL  . BREAST MASS, LEFT  . ARM PAIN, RIGHT  . OSTEOPENIA  . Dizziness and giddiness  . FATIGUE  . ALLERGY  . Hypersomnia  . Headache  . Loss of perception for taste  . Loss, sense of, smell  . UTI (lower urinary tract infection)  . Routine general medical examination at a health care facility   Past Medical History  Diagnosis Date  . Dysmetabolic syndrome X   . Personal history of thrombophlebitis   . Essential hypertension, benign   . Morbid obesity   . Mixed hyperlipidemia   . Nonspecific abnormal electrocardiogram (ECG) (EKG)   . Special screening for malignant neoplasms of other sites   . Routine general medical  examination at a health care facility   . Other screening mammogram   . Depressive disorder, not elsewhere classified   . Type II or unspecified type diabetes mellitus without mention of complication, not stated as uncontrolled   . Bell's palsy   . Need for prophylactic hormone replacement therapy (postmenopausal)   . Allergy, unspecified not elsewhere classified   . Adjustment disorder with depressed mood   . Need for prophylactic vaccination and inoculation against influenza    Past Surgical History  Procedure Date  . Tonsillectomy and adenoidectomy 1970  . Bladder tack 11/1995  . Septoplasty 08/1998    with antral window   . Breast cyst aspiration 12/1998  . Breast biopsy 05/20/2000  . Knee arthroscopy 12/12/2002    right   . Fracture surgery 02/09/2006    bilateral elbows  . Fracture surgery     right knee  . Fracture surgery     tib plateau  . Eye surgery     cataract both eyes   History  Substance Use Topics  . Smoking status: Never Smoker   . Smokeless tobacco: Never Used  . Alcohol Use: No   Family History  Problem Relation Age of Onset  . COPD Father   . Diabetes Sister   . Vision loss Sister     legally blind  . Asthma Mother   . Heart disease Father    Allergies  Allergen Reactions  . Codeine Sulfate     REACTION: hallucinations  . Penicillins     REACTION: rash   Current Outpatient Prescriptions on File Prior to Visit  Medication Sig Dispense Refill  . ALPRAZolam (XANAX) 0.5 MG tablet Take 1 tablet (0.5 mg total) by mouth 2 (two) times daily.  180 tablet  3  . aspirin 81 MG tablet Take 81 mg by mouth daily.        . Calcium Carbonate-Vitamin D (CALTRATE 600+D) 600-400 MG-UNIT per chew tablet Chew 2 tablets by mouth daily.        . carbidopa-levodopa (SINEMET) 25-100 MG per tablet increase as directed to 2 tablet three times a day with meals  540 tablet  3  . cetirizine (ZYRTEC) 10 MG tablet Take 10 mg by mouth at bedtime.        . Cholecalciferol  (VITAMIN D3) 1000 UNITS CAPS Take 1 capsule by mouth daily.        Marland Kitchen FLUoxetine (PROZAC) 20 MG tablet 2 tabs by mouth daily.  30 tablet  6  . losartan-hydrochlorothiazide (HYZAAR) 50-12.5 MG per tablet TAKE 1 TABLET DAILY  90 tablet  3  . Multiple Vitamins-Minerals (CENTRUM SILVER PO) Take 1 tablet by mouth daily.        . simvastatin (ZOCOR) 80 MG tablet Take 1 tablet (80 mg total) by mouth at bedtime.  90 tablet  3  . vitamin B-12 (CYANOCOBALAMIN) 1000 MCG tablet Take 1,000 mcg by mouth daily.         The PMH, PSH, Social History, Family History, Medications, and allergies have been reviewed in [facility], and have been updated if relevant.    Review of Systems See HPI    Patient reports no  vision/ hearing changes,anorexia, weight change, fever ,adenopathy, persistant / recurrent hoarseness, swallowing issues, chest pain, edema,persistant / recurrent cough, hemoptysis, dyspnea(rest, exertional, paroxysmal nocturnal), gastrointestinal  bleeding (melena, rectal bleeding), abdominal pain, excessive heart burn, GU symptoms(dysuria, hematuria, pyuria, voiding/incontinence  Issues) syncope, focal weakness, severe memory loss, concerning skin lesions, depression, anxiety, abnormal bruising/bleeding, major joint swelling, breast masses or abnormal vaginal bleeding.    Objective:   Physical Exam BP 120/80  Pulse 84  Temp(Src) 98.1 F (36.7 C) (Oral)  Wt 159 lb (72.122 kg)  General: Well-developed,well-nourished,in no acute distress; alert,appropriate and cooperative throughout examination, minimally overweight.  Mouth: Oral mucosa and oropharynx without lesions or exudates. Teeth in good repair. Left facial paralysis unchanged.  Neck: No deformities, masses, or tenderness noted.  No bruits  Lungs: Normal respiratory effort, chest expands symmetrically. Lungs are clear to auscultation, no crackles or wheezes.  Heart: Normal rate and regular rhythm. S1 and S2 normal without gallop, murmur, click, rub  or other extra sounds.  Abdomen: Bowel sounds positive,abdomen soft and non-tender without masses, organomegaly or hernias noted. NO CVA or suprapubic tenderness.  Extremities: No clubbing, cyanosis, edema, or deformity noted with normal full range of motion of all joints.  Neurologic: No cranial nerve deficits noted. Station and gait are normal. Sensory, motor and coordinative functions appear intact.  Psych: Cognition and judgment appear intact. Alert and cooperative with normal attention span and concentration. No apparent delusions, illusions, hallucinations     Assessment & Plan:   1. Routine general medical examination at a health care facility   The patients weight, height, BMI and visual acuity have been recorded in the chart I have made referrals, counseling and provided education to the patient based review of the above and  I have provided the pt with a written personalized care plan for preventive services.  Fecal occult blood, imunochemical  2. UTI (lower urinary tract infection)  UA neg, reassurance provided. POCT urinalysis dipstick

## 2011-09-23 ENCOUNTER — Ambulatory Visit: Payer: 59 | Admitting: Physical Therapy

## 2011-09-29 ENCOUNTER — Other Ambulatory Visit: Payer: 59

## 2011-09-29 DIAGNOSIS — Z1211 Encounter for screening for malignant neoplasm of colon: Secondary | ICD-10-CM

## 2011-09-29 LAB — FECAL OCCULT BLOOD, IMMUNOCHEMICAL: Fecal Occult Bld: NEGATIVE

## 2011-09-30 ENCOUNTER — Encounter: Payer: Self-pay | Admitting: *Deleted

## 2011-09-30 ENCOUNTER — Telehealth: Payer: Self-pay | Admitting: *Deleted

## 2011-09-30 NOTE — Telephone Encounter (Signed)
Opened in error

## 2011-10-04 ENCOUNTER — Ambulatory Visit: Payer: 59 | Attending: Neurology | Admitting: Physical Therapy

## 2011-10-04 DIAGNOSIS — G20A1 Parkinson's disease without dyskinesia, without mention of fluctuations: Secondary | ICD-10-CM | POA: Insufficient documentation

## 2011-10-04 DIAGNOSIS — IMO0001 Reserved for inherently not codable concepts without codable children: Secondary | ICD-10-CM | POA: Insufficient documentation

## 2011-10-04 DIAGNOSIS — G20 Parkinson's disease: Secondary | ICD-10-CM | POA: Insufficient documentation

## 2011-10-04 DIAGNOSIS — R1310 Dysphagia, unspecified: Secondary | ICD-10-CM | POA: Insufficient documentation

## 2011-10-04 DIAGNOSIS — R498 Other voice and resonance disorders: Secondary | ICD-10-CM | POA: Insufficient documentation

## 2011-10-06 ENCOUNTER — Ambulatory Visit: Payer: 59 | Admitting: Physical Therapy

## 2011-10-11 ENCOUNTER — Ambulatory Visit: Payer: 59 | Admitting: Physical Therapy

## 2011-10-12 ENCOUNTER — Ambulatory Visit: Payer: 59 | Admitting: Neurology

## 2011-10-13 ENCOUNTER — Ambulatory Visit: Payer: 59 | Admitting: Physical Therapy

## 2011-10-15 ENCOUNTER — Ambulatory Visit: Payer: 59 | Admitting: Physical Therapy

## 2011-10-18 ENCOUNTER — Ambulatory Visit: Payer: 59 | Admitting: Physical Therapy

## 2011-10-19 ENCOUNTER — Encounter: Payer: Self-pay | Admitting: Neurology

## 2011-10-19 ENCOUNTER — Ambulatory Visit (INDEPENDENT_AMBULATORY_CARE_PROVIDER_SITE_OTHER): Payer: 59 | Admitting: Neurology

## 2011-10-19 DIAGNOSIS — R269 Unspecified abnormalities of gait and mobility: Secondary | ICD-10-CM

## 2011-10-19 DIAGNOSIS — G20 Parkinson's disease: Secondary | ICD-10-CM

## 2011-10-19 MED ORDER — CARBIDOPA-LEVODOPA 25-100 MG PO TABS: 3.0000 | ORAL_TABLET | Freq: Three times a day (TID) | ORAL | Status: DC

## 2011-10-19 NOTE — Progress Notes (Signed)
Dear Dr. Dayton Martes,  Thank you for having me see [name] in follow up today at [facility] for her problem with parkinsonism.  As you may recall, she is a 66 y.o. 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  When I last saw her I increased her Sinemet to 200/50 to see if I could get a response.  She also has gone to Parkinson's therapy.  She is unsure whether it has helped her gait.  She has not had any falls.  She complains of taking small steps.  Her daughter says that she has become more withdrawn but she denies depression.  She also has had problems with paranoia about the conversations that are going on around her.  She denies hallucinations.  She does have some lightheadedness when she stands up.  She is also developing problems with swallowing.  She has not noted any improvement with the increased Sinemet.  She is not having any side effects from it.  She is still continuing to work.   Medical history, social history, and family history were reviewed and have not changed since the last clinic visit.  Current Outpatient Prescriptions on File Prior to Visit  Medication Sig Dispense Refill  . ALPRAZolam (XANAX) 0.5 MG tablet Take 1 tablet (0.5 mg total) by mouth 2 (two) times daily.  180 tablet  3  . aspirin 81 MG tablet Take 81 mg by mouth daily.        . Calcium Carbonate-Vitamin D (CALTRATE 600+D) 600-400 MG-UNIT per chew tablet Chew 2 tablets by mouth daily.        . cetirizine (ZYRTEC) 10 MG tablet Take 10 mg by mouth at bedtime.        . Cholecalciferol (VITAMIN D3) 1000 UNITS CAPS Take 1 capsule by mouth daily.        Marland Kitchen FLUoxetine (PROZAC) 20 MG tablet 2 tabs by mouth daily.  30 tablet  6  . losartan-hydrochlorothiazide (HYZAAR) 50-12.5 MG per tablet TAKE 1 TABLET DAILY  90 tablet  3  . Multiple Vitamins-Minerals (CENTRUM SILVER PO) Take 1 tablet by  mouth daily.        . simvastatin (ZOCOR) 80 MG tablet Take 1 tablet (80 mg total) by mouth at bedtime.  90 tablet  3  . vitamin B-12 (CYANOCOBALAMIN) 1000 MCG tablet Take 1,000 mcg by mouth daily.          Allergies  Allergen Reactions  . Codeine Sulfate     REACTION: hallucinations  . Penicillins     REACTION: rash    ROS:  13 systems were reviewed and are notable for chronic urinary incontinence.  She also has anosmia.  All other review of systems are unremarkable.  Exam: .      lying    standing. Filed Vitals:   10/19/11 1202 10/19/11 1552 10/19/11 1553  BP: 140/84 110/78 100/64  Pulse: 84 84 80  Height: 5\' 2"  (1.575 m)      In general, masked facies.    Cranial Nerves: EOMs reveal hypometric saccades.   Facial sensation and muscles of mastication are intact. Muscles of facial expression reveal left sided lower facial weakness and synkinesis. Tongue protrusion, uvula, palate midline.  Shoulder shrug intact  Motor:  Mild increase in axial tone.  Appendicular tone normal.  Bradykinesia of FFM and heal tapping bilaterally.  Reflexes:  Brisk throughout,  toes up on right, down on left.   Gait:  Stooped gait and station, turns slowly. Decreased arm swing.  No tremor. mild retropulsion.  Impression/Recommendations: Parkinson's plus syndrome - ?LBD, ?MSA I am going to increase her Sinemet to 300/75 tid.  If this does not give her improvement then I will likely stop it.  I have referred her for a swallowing eval as well given her dysphagia.  We will see the patient back in 3 months.  Lupita Raider Modesto Charon, MD [facility], [place]

## 2011-10-19 NOTE — Patient Instructions (Addendum)
Your next appointment with Dr. Modesto Charon is scheduled on 01/19/12 at 11:00 am.    [phone].   I will call you with information on your swallowing evaluation.

## 2011-10-20 ENCOUNTER — Ambulatory Visit: Payer: 59 | Admitting: Physical Therapy

## 2011-10-20 ENCOUNTER — Telehealth: Payer: Self-pay | Admitting: Neurology

## 2011-10-20 NOTE — Telephone Encounter (Signed)
Left a message for [name] letting her know that the swallowing evaluation for her mom is scheduled for 10/25/11 at the Neurorehabilitation building located at [address]. She currently receives PT there and her appointment on 04/01 will follow her PT session scheduled at 0815 that morning. Asked that she call me to confirm receipt of my message.

## 2011-10-21 NOTE — Telephone Encounter (Signed)
Spoke with the patient's daughter, [name]. Aware of appointment date and time for the swallowing evaluation.

## 2011-10-25 ENCOUNTER — Ambulatory Visit: Payer: 59

## 2011-10-25 ENCOUNTER — Ambulatory Visit: Payer: 59 | Attending: Neurology | Admitting: Physical Therapy

## 2011-10-25 DIAGNOSIS — IMO0001 Reserved for inherently not codable concepts without codable children: Secondary | ICD-10-CM | POA: Insufficient documentation

## 2011-10-25 DIAGNOSIS — R269 Unspecified abnormalities of gait and mobility: Secondary | ICD-10-CM | POA: Insufficient documentation

## 2011-10-26 ENCOUNTER — Ambulatory Visit: Payer: 59 | Admitting: Physical Therapy

## 2011-10-27 ENCOUNTER — Ambulatory Visit: Payer: 59 | Admitting: Physical Therapy

## 2011-11-02 ENCOUNTER — Ambulatory Visit: Payer: 59 | Admitting: Physical Therapy

## 2011-11-02 ENCOUNTER — Ambulatory Visit: Payer: 59

## 2011-11-04 ENCOUNTER — Ambulatory Visit: Payer: 59

## 2011-11-04 ENCOUNTER — Ambulatory Visit: Payer: 59 | Admitting: Physical Therapy

## 2011-11-09 ENCOUNTER — Ambulatory Visit: Payer: 59 | Admitting: Physical Therapy

## 2011-11-11 ENCOUNTER — Ambulatory Visit: Payer: 59 | Admitting: Physical Therapy

## 2011-11-15 ENCOUNTER — Ambulatory Visit: Payer: 59

## 2011-11-17 ENCOUNTER — Ambulatory Visit: Payer: 59

## 2011-11-22 ENCOUNTER — Telehealth: Payer: Self-pay | Admitting: Neurology

## 2011-11-22 NOTE — Telephone Encounter (Signed)
Spoke with the patient at her work number. Instructed to decrease her Sinemet to 2 tabs TID for 10 days and see if she feels better at this lower dose. Asked that she call the office with an update and she states she will.

## 2011-11-22 NOTE — Telephone Encounter (Signed)
Received a call from the patient. She states Dr. Modesto Charon increased her Parkinson's medication around March 26th or 27th to three pills TID (Carbo-Levo 25/100). She reports that ever since then she has had no apprtite, doesn't want anything to drink, had no energy and can't get warm; stays cold all the time. She states she is still working but it is a real effort to get there every day. Denies depression, additional stress. Parkinson's sx are better but the other sx are quite bothersome. I told her I would let Dr. Modesto Charon know and we would be in touch. The patient is ok with this plan. **Dr. Modesto Charon, please advise. Thanks.

## 2011-11-22 NOTE — Telephone Encounter (Signed)
have her decreased her sinemet to 2 pills three times a day and see if she feels better after 10 days.

## 2011-11-25 ENCOUNTER — Ambulatory Visit (INDEPENDENT_AMBULATORY_CARE_PROVIDER_SITE_OTHER): Payer: 59 | Admitting: Family Medicine

## 2011-11-25 ENCOUNTER — Encounter: Payer: Self-pay | Admitting: Family Medicine

## 2011-11-25 ENCOUNTER — Telehealth: Payer: Self-pay | Admitting: Family Medicine

## 2011-11-25 VITALS — BP 132/80 | HR 72 | Temp 98.0°F | Wt 158.0 lb

## 2011-11-25 DIAGNOSIS — N390 Urinary tract infection, site not specified: Secondary | ICD-10-CM

## 2011-11-25 MED ORDER — CIPROFLOXACIN HCL 250 MG PO TABS: 250.0000 mg | ORAL_TABLET | Freq: Two times a day (BID) | ORAL | Status: DC

## 2011-11-25 NOTE — Telephone Encounter (Signed)
Emergent Call: Caller: Jackie/Child; PCP: Gwinda Passe); CB#: [phone];  Call regarding Urinary Pain/Bleeding. Reports urine odor like amnonia since 11/25/11 and worsening stumbling when walks.  Afebrile. Currently at work now. Hx of stress incontinence; noted brown color on peripad when changes it. Denies pain, frequency or urgency.  Advised to see MD within 24 hrs for one or more UTI symptoms that has not been previously evaluated.  Appt scheduled for 1015 11/25/11 with Dr Dayton Martes.

## 2011-11-25 NOTE — Progress Notes (Signed)
Subjective:    Patient ID: [name], female    DOB: 1946/05/28, 66 y.o.   MRN: [id number]  HPI  66 yo here for ?UTI.  Daughter wanted her to get checked out- has had increased falls lately.  Unclear if due to UTI or her neurodegenerative disease. H/o UTI without urinary symptoms.  Denies dysuria, back pain or suprapubic tenderness. No fever, chills, nausea or vomiting.  Daughter noticed brownish color on her depends and "ammonia odor."  UA positive Mod LE and protein, otherwise unremarkable.    Review of Systems See HPI  Patient Active Problem List  Diagnoses  . AODM  . HYPERLIPIDEMIA, MIXED  . Dysmetabolic syndrome X  . OBESITY, MORBID  . ANXIETY DEPRESSION  . GRIEF REACTION  . DISORDER, DEPRESSIVE NEC  . HYPERTENSION, BENIGN ESSENTIAL  . BREAST MASS, LEFT  . ARM PAIN, RIGHT  . OSTEOPENIA  . Dizziness and giddiness  . FATIGUE  . ALLERGY  . Hypersomnia  . Headache  . Loss of perception for taste  . Loss, sense of, smell  . UTI (lower urinary tract infection)  . Routine general medical examination at a health care facility   Past Medical History  Diagnosis Date  . Dysmetabolic syndrome X   . Personal history of thrombophlebitis   . Essential hypertension, benign   . Morbid obesity   . Mixed hyperlipidemia   . Nonspecific abnormal electrocardiogram (ECG) (EKG)   . Special screening for malignant neoplasms of other sites   . Routine general medical examination at a health care facility   . Other screening mammogram   . Depressive disorder, not elsewhere classified   . Type II or unspecified type diabetes mellitus without mention of complication, not stated as uncontrolled   . Bell's palsy   . Need for prophylactic hormone replacement therapy (postmenopausal)   . Allergy, unspecified not elsewhere classified   . Adjustment disorder with depressed mood   . Need for prophylactic vaccination and inoculation against influenza    Past Surgical History    Procedure Date  . Tonsillectomy and adenoidectomy 1970  . Bladder tack 11/1995  . Septoplasty 08/1998    with antral window   . Breast cyst aspiration 12/1998  . Breast biopsy 05/20/2000  . Knee arthroscopy 12/12/2002    right   . Fracture surgery 02/09/2006    bilateral elbows  . Fracture surgery     right knee  . Fracture surgery     tib plateau  . Eye surgery     cataract both eyes   History  Substance Use Topics  . Smoking status: Never Smoker   . Smokeless tobacco: Never Used  . Alcohol Use: No   Family History  Problem Relation Age of Onset  . COPD Father   . Diabetes Sister   . Vision loss Sister     legally blind  . Asthma Mother   . Heart disease Father    Allergies  Allergen Reactions  . Codeine Sulfate     REACTION: hallucinations  . Penicillins     REACTION: rash   Current Outpatient Prescriptions on File Prior to Visit  Medication Sig Dispense Refill  . ALPRAZolam (XANAX) 0.5 MG tablet Take 1 tablet (0.5 mg total) by mouth 2 (two) times daily.  180 tablet  3  . aspirin 81 MG tablet Take 81 mg by mouth daily.        . Calcium Carbonate-Vitamin D (CALTRATE 600+D) 600-400 MG-UNIT per chew tablet Chew 2  tablets by mouth daily.        . carbidopa-levodopa (SINEMET) 25-100 MG per tablet Take 3 tablets by mouth 3 (three) times daily.  810 tablet  3  . cetirizine (ZYRTEC) 10 MG tablet Take 10 mg by mouth at bedtime.        . Cholecalciferol (VITAMIN D3) 1000 UNITS CAPS Take 1 capsule by mouth daily.        Marland Kitchen FLUoxetine (PROZAC) 20 MG tablet 2 tabs by mouth daily.  30 tablet  6  . losartan-hydrochlorothiazide (HYZAAR) 50-12.5 MG per tablet TAKE 1 TABLET DAILY  90 tablet  3  . Multiple Vitamins-Minerals (CENTRUM SILVER PO) Take 1 tablet by mouth daily.        . simvastatin (ZOCOR) 80 MG tablet Take 1 tablet (80 mg total) by mouth at bedtime.  90 tablet  3  . vitamin B-12 (CYANOCOBALAMIN) 1000 MCG tablet Take 1,000 mcg by mouth daily.         The PMH, PSH,  Social History, Family History, Medications, and allergies have been reviewed in [facility], and have been updated if relevant.     Objective:   Physical Exam BP 132/80  Pulse 72  Temp(Src) 98 F (36.7 C) (Oral)  Wt 158 lb (71.668 kg)  General: Well-developed,well-nourished,in no acute distress; alert,appropriate and cooperative throughout examination, minimally overweight.  Mouth: Oral mucosa and oropharynx without lesions or exudates. Teeth in good repair. Left facial paralysis unchanged.  Neck: No deformities, masses, or tenderness noted.  No bruits  Lungs: Normal respiratory effort, chest expands symmetrically. Lungs are clear to auscultation, no crackles or wheezes.  Heart: Normal rate and regular rhythm. S1 and S2 normal without gallop, murmur, click, rub or other extra sounds.  Abdomen: Bowel sounds positive,abdomen soft and non-tender without masses, organomegaly or hernias noted. NO CVA or suprapubic tenderness.  Extremities: No clubbing, cyanosis, edema, or deformity noted with normal full range of motion of all joints.  Neurologic: No cranial nerve deficits noted. Station and gait are normal. Sensory, motor and coordinative functions appear intact.  Psych: Cognition and judgment appear intact. Alert and cooperative with normal attention span and concentration. No apparent delusions, illusions, hallucinations     Assessment & Plan:   1. UTI (lower urinary tract infection)   New- given symptoms and pos LE on UA, will treat with cipro 250 mg twice daily x 5 days and send urine for cx. The patient indicates understanding of these issues and agrees with the plan.

## 2011-11-25 NOTE — Progress Notes (Signed)
Addended by: Eliezer Bottom on: 11/25/2011 10:49 AM   Modules accepted: Orders

## 2011-11-27 LAB — URINE CULTURE
Colony Count: NO GROWTH
Organism ID, Bacteria: NO GROWTH

## 2011-11-29 ENCOUNTER — Ambulatory Visit: Payer: 59 | Attending: Neurology

## 2011-11-29 DIAGNOSIS — IMO0001 Reserved for inherently not codable concepts without codable children: Secondary | ICD-10-CM | POA: Insufficient documentation

## 2011-11-29 DIAGNOSIS — R269 Unspecified abnormalities of gait and mobility: Secondary | ICD-10-CM | POA: Insufficient documentation

## 2011-12-06 ENCOUNTER — Encounter (HOSPITAL_COMMUNITY): Payer: Self-pay | Admitting: *Deleted

## 2011-12-06 ENCOUNTER — Observation Stay (HOSPITAL_COMMUNITY)
Admission: EM | Admit: 2011-12-06 | Discharge: 2011-12-07 | Disposition: A | Discharge status: Home / Self Care | Payer: 59 | Source: Ambulatory Visit | Attending: Internal Medicine | Admitting: Internal Medicine

## 2011-12-06 ENCOUNTER — Emergency Department (HOSPITAL_COMMUNITY): Payer: 59

## 2011-12-06 DIAGNOSIS — E8881 Metabolic syndrome: Secondary | ICD-10-CM | POA: Insufficient documentation

## 2011-12-06 DIAGNOSIS — I951 Orthostatic hypotension: Secondary | ICD-10-CM

## 2011-12-06 DIAGNOSIS — G20A1 Parkinson's disease without dyskinesia, without mention of fluctuations: Secondary | ICD-10-CM | POA: Insufficient documentation

## 2011-12-06 DIAGNOSIS — G510 Bell's palsy: Secondary | ICD-10-CM | POA: Diagnosis present

## 2011-12-06 DIAGNOSIS — W19XXXA Unspecified fall, initial encounter: Secondary | ICD-10-CM | POA: Insufficient documentation

## 2011-12-06 DIAGNOSIS — E119 Type 2 diabetes mellitus without complications: Secondary | ICD-10-CM | POA: Insufficient documentation

## 2011-12-06 DIAGNOSIS — R55 Syncope and collapse: Secondary | ICD-10-CM | POA: Diagnosis present

## 2011-12-06 DIAGNOSIS — G20 Parkinson's disease: Secondary | ICD-10-CM | POA: Insufficient documentation

## 2011-12-06 DIAGNOSIS — I517 Cardiomegaly: Secondary | ICD-10-CM

## 2011-12-06 DIAGNOSIS — Z9181 History of falling: Secondary | ICD-10-CM | POA: Insufficient documentation

## 2011-12-06 DIAGNOSIS — R1310 Dysphagia, unspecified: Secondary | ICD-10-CM | POA: Diagnosis present

## 2011-12-06 DIAGNOSIS — F3289 Other specified depressive episodes: Secondary | ICD-10-CM | POA: Diagnosis present

## 2011-12-06 DIAGNOSIS — R51 Headache: Secondary | ICD-10-CM | POA: Insufficient documentation

## 2011-12-06 DIAGNOSIS — F4321 Adjustment disorder with depressed mood: Secondary | ICD-10-CM | POA: Insufficient documentation

## 2011-12-06 DIAGNOSIS — I10 Essential (primary) hypertension: Secondary | ICD-10-CM | POA: Diagnosis present

## 2011-12-06 DIAGNOSIS — Y92009 Unspecified place in unspecified non-institutional (private) residence as the place of occurrence of the external cause: Secondary | ICD-10-CM | POA: Insufficient documentation

## 2011-12-06 DIAGNOSIS — E782 Mixed hyperlipidemia: Secondary | ICD-10-CM | POA: Diagnosis present

## 2011-12-06 DIAGNOSIS — F329 Major depressive disorder, single episode, unspecified: Secondary | ICD-10-CM | POA: Diagnosis present

## 2011-12-06 DIAGNOSIS — E785 Hyperlipidemia, unspecified: Secondary | ICD-10-CM | POA: Insufficient documentation

## 2011-12-06 HISTORY — DX: Syncope and collapse: R55

## 2011-12-06 HISTORY — DX: Other abnormalities of gait and mobility: R26.89

## 2011-12-06 HISTORY — DX: Personal history of other infectious and parasitic diseases: Z86.19

## 2011-12-06 HISTORY — DX: Anxiety disorder, unspecified: F41.9

## 2011-12-06 HISTORY — DX: Acute embolism and thrombosis of unspecified deep veins of left lower extremity: I82.402

## 2011-12-06 HISTORY — DX: Unspecified disturbances of smell and taste: R43.9

## 2011-12-06 HISTORY — DX: Headache: R51

## 2011-12-06 HISTORY — DX: Type 2 diabetes mellitus without complications: E11.9

## 2011-12-06 HISTORY — DX: Parkinson's disease: G20

## 2011-12-06 LAB — URINALYSIS, ROUTINE W REFLEX MICROSCOPIC
Bilirubin Urine: NEGATIVE
Glucose, UA: NEGATIVE mg/dL
Hgb urine dipstick: NEGATIVE
Ketones, ur: NEGATIVE mg/dL
Nitrite: NEGATIVE
Protein, ur: NEGATIVE mg/dL
Specific Gravity, Urine: 1.018 (ref 1.005–1.030)
Urobilinogen, UA: 0.2 mg/dL (ref 0.0–1.0)
pH: 7 (ref 5.0–8.0)

## 2011-12-06 LAB — DIFFERENTIAL
Basophils Absolute: 0 10*3/uL (ref 0.0–0.1)
Basophils Relative: 0 % (ref 0–1)
Eosinophils Absolute: 0.1 10*3/uL (ref 0.0–0.7)
Eosinophils Relative: 1 % (ref 0–5)
Lymphocytes Relative: 10 % — ABNORMAL LOW (ref 12–46)
Lymphs Abs: 1.1 10*3/uL (ref 0.7–4.0)
Monocytes Absolute: 0.6 10*3/uL (ref 0.1–1.0)
Monocytes Relative: 6 % (ref 3–12)
Neutro Abs: 9 10*3/uL — ABNORMAL HIGH (ref 1.7–7.7)
Neutrophils Relative %: 83 % — ABNORMAL HIGH (ref 43–77)

## 2011-12-06 LAB — BASIC METABOLIC PANEL
BUN: 20 mg/dL (ref 6–23)
CO2: 27 mEq/L (ref 19–32)
Calcium: 10.1 mg/dL (ref 8.4–10.5)
Chloride: 98 mEq/L (ref 96–112)
Creatinine, Ser: 0.56 mg/dL (ref 0.50–1.10)
GFR calc Af Amer: >90 mL/min (ref >90)
GFR calc non Af Amer: >90 mL/min (ref >90)
Glucose, Bld: 155 mg/dL — ABNORMAL HIGH (ref 70–99)
Potassium: 3.6 mEq/L (ref 3.5–5.1)
Sodium: 137 mEq/L (ref 135–145)

## 2011-12-06 LAB — CARDIAC PANEL(CRET KIN+CKTOT+MB+TROPI)
CK, MB: 1.6 ng/mL (ref 0.3–4.0)
CK, MB: 1.3 ng/mL (ref 0.3–4.0)
Relative Index: INVALID (ref 0.0–2.5)
Relative Index: INVALID (ref 0.0–2.5)
Total CK: 50 U/L (ref 7–177)
Total CK: 40 U/L (ref 7–177)
Troponin I: <0.3 ng/mL (ref <0.30)
Troponin I: <0.3 ng/mL (ref <0.30)

## 2011-12-06 LAB — CBC
HCT: 38 % (ref 36.0–46.0)
Hemoglobin: 12.8 g/dL (ref 12.0–15.0)
MCH: 31.4 pg (ref 26.0–34.0)
MCHC: 33.7 g/dL (ref 30.0–36.0)
MCV: 93.4 fL (ref 78.0–100.0)
Platelets: 181 10*3/uL (ref 150–400)
RBC: 4.07 MIL/uL (ref 3.87–5.11)
RDW: 12.8 % (ref 11.5–15.5)
WBC: 10.7 10*3/uL — ABNORMAL HIGH (ref 4.0–10.5)

## 2011-12-06 LAB — POCT I-STAT TROPONIN I: Troponin i, poc: 0 ng/mL (ref 0.00–0.08)

## 2011-12-06 LAB — URINE MICROSCOPIC-ADD ON

## 2011-12-06 MED ORDER — SODIUM CHLORIDE 0.9 % IV SOLN
INTRAVENOUS | Status: DC
  Administered 2011-12-06: 18:00:00 via INTRAVENOUS

## 2011-12-06 MED ORDER — ASPIRIN 81 MG PO TABS: 81.0000 mg | ORAL_TABLET | Freq: Every day | ORAL | Status: DC

## 2011-12-06 MED ORDER — VITAMIN D3 25 MCG (1000 UT) PO CAPS: 1.0000 | ORAL_CAPSULE | Freq: Every day | ORAL | Status: DC

## 2011-12-06 MED ORDER — ASPIRIN EC 81 MG PO TBEC
81.0000 mg | DELAYED_RELEASE_TABLET | Freq: Every day | ORAL | Status: DC
  Administered 2011-12-07: 81 mg via ORAL
  Filled 2011-12-06: qty 1

## 2011-12-06 MED ORDER — ATORVASTATIN CALCIUM 40 MG PO TABS
40.0000 mg | ORAL_TABLET | Freq: Every day | ORAL | Status: DC
  Administered 2011-12-06 – 2011-12-07 (×2): 40 mg via ORAL
  Filled 2011-12-06 (×2): qty 1

## 2011-12-06 MED ORDER — CALCIUM CARBONATE-VITAMIN D 600-400 MG-UNIT PO CHEW: 1.0000 | CHEWABLE_TABLET | Freq: Two times a day (BID) | ORAL | Status: DC

## 2011-12-06 MED ORDER — ACETAMINOPHEN 650 MG RE SUPP: 650.0000 mg | Freq: Four times a day (QID) | RECTAL | Status: DC | PRN

## 2011-12-06 MED ORDER — ENOXAPARIN SODIUM 40 MG/0.4ML ~~LOC~~ SOLN
40.0000 mg | SUBCUTANEOUS | Status: DC
  Administered 2011-12-06 – 2011-12-07 (×2): 40 mg via SUBCUTANEOUS
  Filled 2011-12-06 (×2): qty 0.4

## 2011-12-06 MED ORDER — CARBIDOPA-LEVODOPA 25-100 MG PO TABS
3.0000 | ORAL_TABLET | Freq: Three times a day (TID) | ORAL | Status: DC
  Administered 2011-12-06 – 2011-12-07 (×4): 3 via ORAL
  Filled 2011-12-06 (×5): qty 3

## 2011-12-06 MED ORDER — VITAMIN B-12 1000 MCG PO TABS
1000.0000 ug | ORAL_TABLET | Freq: Every day | ORAL | Status: DC
  Administered 2011-12-07: 1000 ug via ORAL
  Filled 2011-12-06: qty 1

## 2011-12-06 MED ORDER — SODIUM CHLORIDE 0.9 % IJ SOLN
3.0000 mL | Freq: Two times a day (BID) | INTRAMUSCULAR | Status: DC
  Administered 2011-12-07: 3 mL via INTRAVENOUS

## 2011-12-06 MED ORDER — VITAMIN D3 25 MCG (1000 UNIT) PO TABS
1000.0000 [IU] | ORAL_TABLET | Freq: Every day | ORAL | Status: DC
  Administered 2011-12-07: 1000 [IU] via ORAL
  Filled 2011-12-06: qty 1

## 2011-12-06 MED ORDER — CALCIUM CARBONATE-VITAMIN D 500-200 MG-UNIT PO TABS
1.0000 | ORAL_TABLET | Freq: Two times a day (BID) | ORAL | Status: DC
  Administered 2011-12-06 – 2011-12-07 (×2): 1 via ORAL
  Filled 2011-12-06 (×3): qty 1

## 2011-12-06 MED ORDER — ZOLPIDEM TARTRATE 5 MG PO TABS: 5.0000 mg | ORAL_TABLET | Freq: Every evening | ORAL | Status: DC | PRN

## 2011-12-06 MED ORDER — ALPRAZOLAM 0.5 MG PO TABS
0.5000 mg | ORAL_TABLET | Freq: Two times a day (BID) | ORAL | Status: DC
  Administered 2011-12-06 – 2011-12-07 (×2): 0.5 mg via ORAL
  Filled 2011-12-06 (×2): qty 1

## 2011-12-06 MED ORDER — FLUOXETINE HCL 20 MG PO TABS
40.0000 mg | ORAL_TABLET | Freq: Every day | ORAL | Status: DC
  Administered 2011-12-07: 40 mg via ORAL
  Filled 2011-12-06: qty 2

## 2011-12-06 MED ORDER — ACETAMINOPHEN 325 MG PO TABS: 650.0000 mg | ORAL_TABLET | Freq: Four times a day (QID) | ORAL | Status: DC | PRN

## 2011-12-06 MED ORDER — SENNOSIDES-DOCUSATE SODIUM 8.6-50 MG PO TABS
1.0000 | ORAL_TABLET | Freq: Every evening | ORAL | Status: DC | PRN
  Filled 2011-12-06: qty 1

## 2011-12-06 NOTE — Progress Notes (Signed)
  Echocardiogram 2D Echocardiogram has been performed.  [name], Real Cons 12/06/2011, 4:24 PM

## 2011-12-06 NOTE — ED Provider Notes (Signed)
Medical screening examination/treatment/procedure(s) were performed by non-physician practitioner and as supervising physician I was immediately available for consultation/collaboration.  Juliet Rude. Rubin Payor, MD 12/06/11 1547

## 2011-12-06 NOTE — Evaluation (Signed)
Physical Therapy Evaluation Patient Details Name: [name] MRN: [id number] DOB: 1946/06/14 Today's Date: 12/06/2011 Time: 4132-4401 PT Time Calculation (min): 21 min  PT Assessment / Plan / Recommendation Clinical Impression  Pt admitted with syncope with h/o Bells Palsy x 2 and Parkinson's. Pt currently receiving OPPT for balance and recommend continuation of these services. Pt with loss of target with visual tracking left and with loss of target with rapid alternating movements both horizontally and vertically with vision. Pt educated for loss of visual target but no dizziness or nystagmus and pt reports she has never noticed vision jumping. Pt reports baseline function and that she does have a tendency for syncope since late 90s. Pt without further acute PT needs and recommend daily ambulation with nursing and continued OPPT.    PT Assessment  All further PT needs can be met in the next venue of care    Follow Up Recommendations  Outpatient PT    Barriers to Discharge        lEquipment Recommendations  Tub/shower bench    Recommendations for Other Services     Frequency      Precautions / Restrictions Precautions Precautions: Fall   Pertinent Vitals/Pain No pain      Mobility  Bed Mobility Bed Mobility: Supine to Sit;Sitting - Scoot to Edge of Bed Supine to Sit: 6: Modified independent (Device/Increase time);HOB flat Sitting - Scoot to Edge of Bed: 6: Modified independent (Device/Increase time) Transfers Transfers: Sit to Stand;Stand to Sit Sit to Stand: 6: Modified independent (Device/Increase time);From bed Stand to Sit: 6: Modified independent (Device/Increase time);To chair/3-in-1;With armrests Ambulation/Gait Ambulation/Gait Assistance: 6: Modified independent (Device/Increase time) Ambulation Distance (Feet): 400 Feet Assistive device: Rolling walker Ambulation/Gait Assistance Details: pt with good speed and stride Gait Pattern: Step-through  pattern;Decreased stride length Stairs: Yes Stairs Assistance: 6: Modified independent (Device/Increase time) Stair Management Technique: One rail Left Number of Stairs: 2     Exercises     PT Diagnosis: Abnormality of gait  PT Problem List: Decreased balance PT Treatment Interventions:     PT Goals    Visit Information  Last PT Received On: 12/06/11 Assistance Needed: +1    Subjective Data  Subjective: I don't drive anymore cause of the Parkinson's Patient Stated Goal: return home and to work   Prior Functioning  Home Living Lives With: Daughter Available Help at Discharge: Family Type of Home: House Home Access: Stairs to enter Secretary/administrator of Steps: 2 Entrance Stairs-Rails: Left Home Layout: One level Bathroom Shower/Tub: Tub/shower unit;Door [employer] Toilet: Handicapped height Home Adaptive Equipment: Straight cane;Walker - rolling Prior Function Level of Independence: Needs assistance Needs Assistance: Bathing Bath: Supervision/set-up Able to Take Stairs?: Yes Driving: No Vocation: Full time employment Comments: Pt is an Airline pilot for [employer] Communication: No difficulties    Cognition  Overall Cognitive Status: Appears within functional limits for tasks assessed/performed Arousal/Alertness: Awake/alert Orientation Level: Appears intact for tasks assessed Behavior During Session: [facility] for tasks performed    Extremity/Trunk Assessment Right Lower Extremity Assessment RLE ROM/Strength/Tone: [facility] for tasks assessed Left Lower Extremity Assessment LLE ROM/Strength/Tone: [facility] for tasks assessed   Balance Balance Balance Assessed: No (pt uses cane at baseline and receiving OPPT)  End of Session PT - End of Session Equipment Utilized During Treatment: Gait belt Activity Tolerance: Patient tolerated treatment well Patient left: in chair;with call bell/phone within reach;with nursing in room   [name] 12/06/2011, 4:48  PM  [name], PT [phone]

## 2011-12-06 NOTE — Consult Note (Signed)
CARDIOLOGY CONSULT NOTE  Patient ID: [name] MRN: [id number] DOB/AGE: [birth year] 66 y.o.  Admit date: 12/06/2011 Primary Physician [name], MD Primary Cardiologist    Dr. Juanito Doom Chief Complaint  Syncope  HPI: The patient presented today after a possible fall versus syncope. She says she was in her kitchen opening the refrigerator. The next she knew she was on the floor sitting leaning against the wall. She had no prodrome. She's not sure that she actually lost consciousness. Her granddaughter was there quickly and called EMS. The patient denied any dizziness or palpitations before or after. She's not had any recent orthostatic symptoms. She denies any chest pressure, neck or arm discomfort. She does report significant gait disturbance and falls. She walks with a cane. She says she has a gait where she will start walking quickly and fall forward onto the right. She has thus far head CT negative for any acute findings. EKG was unremarkable.  Of note I reviewed extensively all available computerized records. She had severe syncope in 2004 with injury. Following that she had tilt table testing which was negative, negative EEG, unremarkable MRI of this report without any bradycardia or tachycardia arrhythmias. She's had previous cardiac catheterization in 2004 with no significant coronary disease. Her last test in our office appears to be 2007 with a negative Myoview. She does currently see a neurologist. She's been evaluated for possible neurodegenerative disease resulting in her parkinsonian gait, taste and smell loss of memory difficulties. She's had a nonacute MRI and EEG without diagnostic findings. However, she's had significant global cognitive dysfunction identified.  The daughter and granddaughter live with the patient. The patient still works as a very limited functional level.  The patient denies any new symptoms such as chest discomfort, neck or arm discomfort. There has been no  new shortness of breath, PND or orthopnea. There have been no reported palpitations, presyncope or syncope.  Past Medical History  Diagnosis Date  . Dysmetabolic syndrome X   . Personal history of thrombophlebitis   . Essential hypertension, benign   . Morbid obesity   . Mixed hyperlipidemia   . Nonspecific abnormal electrocardiogram (ECG) (EKG)   . Special screening for malignant neoplasms of other sites   . Routine general medical examination at a health care facility   . Other screening mammogram   . Depressive disorder, not elsewhere classified   . Type II or unspecified type diabetes mellitus without mention of complication, not stated as uncontrolled   . Bell's palsy   . Need for prophylactic hormone replacement therapy (postmenopausal)   . Allergy, unspecified not elsewhere classified   . Adjustment disorder with depressed mood   . Need for prophylactic vaccination and inoculation against influenza     Past Surgical History  Procedure Date  . Tonsillectomy and adenoidectomy 1970  . Bladder tack 11/1995  . Septoplasty 08/1998    with antral window   . Breast cyst aspiration 12/1998  . Breast biopsy 05/20/2000  . Knee arthroscopy 12/12/2002    right   . Fracture surgery 02/09/2006    bilateral elbows  . Fracture surgery     right knee  . Fracture surgery     tib plateau  . Eye surgery     cataract both eyes    Allergies  Allergen Reactions  . Codeine Sulfate     REACTION: hallucinations  . Penicillins     REACTION: rash   Prescriptions prior to admission  Medication Sig Dispense Refill  .  ALPRAZolam (XANAX) 0.5 MG tablet Take 0.5 mg by mouth 2 (two) times daily.      Marland Kitchen aspirin 81 MG tablet Take 81 mg by mouth daily.       Marland Kitchen Bioflavonoid Products (GRAPE SEED PO) Take 1 tablet by mouth daily.      . Calcium Carbonate-Vitamin D (CALTRATE 600+D) 600-400 MG-UNIT per chew tablet Chew 1 tablet by mouth 2 (two) times daily.      . carbidopa-levodopa (SINEMET IR)  25-100 MG per tablet Take 3 tablets by mouth 3 (three) times daily.      . cetirizine (ZYRTEC) 10 MG tablet Take 10 mg by mouth at bedtime.        . Cholecalciferol (VITAMIN D3) 1000 UNITS CAPS Take 1 capsule by mouth daily.        Marland Kitchen FLUoxetine (PROZAC) 20 MG tablet Take 40 mg by mouth daily. 2 tabs by mouth daily.      Marland Kitchen losartan-hydrochlorothiazide (HYZAAR) 50-12.5 MG per tablet Take 1 tablet by mouth daily.      . Multiple Vitamins-Minerals (CENTRUM SILVER PO) Take 1 tablet by mouth daily.        . simvastatin (ZOCOR) 80 MG tablet Take 1 tablet (80 mg total) by mouth at bedtime.  90 tablet  3  . vitamin B-12 (CYANOCOBALAMIN) 1000 MCG tablet Take 1,000 mcg by mouth daily.         Family History  Problem Relation Age of Onset  . COPD Father   . Diabetes Sister   . Vision loss Sister     legally blind  . Asthma Mother   . Heart disease Father     History   Social History  . Marital Status: Widowed    Spouse Name: N/A    Number of Children: 2  . Years of Education: N/A   Occupational History  . ACCOUNT CLERK [employer]   Social History Main Topics  . Smoking status: Never Smoker   . Smokeless tobacco: Never Used  . Alcohol Use: No  . Drug Use: No  . Sexually Active: Not on file   Other Topics Concern  . Not on file   Social History Narrative   Lives with daughter and granddaughter     ROS:  Frequent vomiting.  Otherwise as stated in the HPI and negative for all other systems.  Physical Exam: Blood pressure 109/70, pulse 68, temperature 97 F (36.1 C), temperature source Oral, resp. rate 18, height 5\' 1"  (1.549 m), weight 151 lb 3.8 oz (68.6 kg), SpO2 100.00%.  GENERAL:  Well appearing HEENT:  Pupils equal round and reactive, fundi not visualized, oral mucosa unremarkable NECK:  No jugular venous distention, waveform within normal limits, carotid upstroke brisk and symmetric, no bruits, no thyromegaly LYMPHATICS:  No cervical, inguinal adenopathy LUNGS:  Clear to  auscultation bilaterally BACK:  No CVA tenderness CHEST:  Unremarkable HEART:  PMI not displaced or sustained,S1 and S2 within normal limits, no S3, no S4, no clicks, no rubs, no murmurs ABD:  Flat, positive bowel sounds normal in frequency in pitch, no bruits, no rebound, no guarding, no midline pulsatile mass, no hepatomegaly, no splenomegaly EXT:  2 plus pulses throughout, no edema, no cyanosis no clubbing, left fifth toe amputation SKIN:  No rashes no nodules NEURO:  Left facial droop PSYCH:  Cognitively intact, oriented to person place and time   Labs: Lab Results  Component Value Date   BUN 20 12/06/2011   Lab Results  Component  Value Date   CREATININE 0.56 12/06/2011   Lab Results  Component Value Date   NA 137 12/06/2011   K 3.6 12/06/2011   CL 98 12/06/2011   CO2 27 12/06/2011   Lab Results  Component Value Date   CKTOTAL 50 12/06/2011   CKMB 1.6 12/06/2011   TROPONINI <0.30 12/06/2011   Lab Results  Component Value Date   WBC 10.7* 12/06/2011   HGB 12.8 12/06/2011   HCT 38.0 12/06/2011   MCV 93.4 12/06/2011   PLT 181 12/06/2011   Lab Results  Component Value Date   CHOL 132 09/14/2011   HDL 61.60 09/14/2011   LDLCALC 42 09/14/2011   TRIG 140.0 09/14/2011   CHOLHDL 2 09/14/2011   Lab Results  Component Value Date   ALT 14 09/14/2011   AST 19 09/14/2011   ALKPHOS 54 09/14/2011   BILITOT 0.5 09/14/2011   Radiology:  CT head:  Atrophy and chronic small vessel disease. Findings are progressive  over time. No acute or traumatic finding.  EKG:  Rate 52, incomplete RBBB, intervals.  No acute ST T wave changes.  12/06/2011  ASSESSMENT AND PLAN:   Syncope:  I have extensively reviewed previous records dating back 10 years. She's had syncope for years with a previous extensive workup as described. She does have active ongoing neurologic issues and diagnoses. At this point its not clear that she had a frank syncopal episode. There is no evidence to suggest a cardiac etiology to  her event. She is getting an echo. I would check orthostatics. She can be on telemetry while here. Otherwise unless there are abnormalities with these evaluations I would not be suggesting a cardiac etiology for further workup.   SignedRollene Rotunda 12/06/2011, 3:38 PM

## 2011-12-06 NOTE — ED Notes (Addendum)
Per EMS.  Pt from home.  Pt was walking, had an episode of dizziness and fell.  Denies injury-reports hitting head when she fell.  No blood thinners.  No LOC, no neck or back pain.  3rd fall this week.  Pt reports falling on Saturday and has had a nose bleed every day since then.  Pt has bells palsy-droop on (L) side.  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  Vitals stable-BP 140s.  22 R-wrist.  CBG 165.

## 2011-12-06 NOTE — ED Provider Notes (Signed)
History     CSN: [id number]  Arrival date & time 12/06/11  1478   First MD Initiated Contact with Patient 12/06/11 0730      Chief Complaint  Patient presents with  . Fall    (Consider location/radiation/quality/duration/timing/severity/associated sxs/prior treatment) HPI History provided by pt and her daughter.  Pt reports that she went into the kitchen at 6:30am to get something out of the refrigerator, became lightheaded and then fell.  She does not recall whether or not she lost consciousness.  She called for her daughter and her daughter found her sitting against kitchen door.  She believes she hit her head.  Her daughter states that she helped her into a chair but she briefly lost consciousness immediately after and she helped her to floor to prevent her from falling.  Pt denies having CP/SOB/palpitations today and no recent fever, cough, vomiting, diarrhea, blood in stool or urinary sx.  Has a posterior headache since fall but has had same frequently recently.  Denies blurred vision, dizziness and N/V.  Had a fall approx one week ago as well.  Her daughter reports h/o parkinson's w/ intermittent falls; seem to be more frequent now.  Falls more frequently when she has a UTI, all of which have been otherwise Asx. Also has h/o syncope for which she has been evaluated by a cardiologist (most recently 2 years ago) and etiology unknown.  Most recent sycnopal episode several months ago.    Past Medical History  Diagnosis Date  . Dysmetabolic syndrome X   . Personal history of thrombophlebitis   . Essential hypertension, benign   . Morbid obesity   . Mixed hyperlipidemia   . Nonspecific abnormal electrocardiogram (ECG) (EKG)   . Special screening for malignant neoplasms of other sites   . Routine general medical examination at a health care facility   . Other screening mammogram   . Depressive disorder, not elsewhere classified   . Type II or unspecified type diabetes mellitus without  mention of complication, not stated as uncontrolled   . Bell's palsy   . Need for prophylactic hormone replacement therapy (postmenopausal)   . Allergy, unspecified not elsewhere classified   . Adjustment disorder with depressed mood   . Need for prophylactic vaccination and inoculation against influenza     Past Surgical History  Procedure Date  . Tonsillectomy and adenoidectomy 1970  . Bladder tack 11/1995  . Septoplasty 08/1998    with antral window   . Breast cyst aspiration 12/1998  . Breast biopsy 05/20/2000  . Knee arthroscopy 12/12/2002    right   . Fracture surgery 02/09/2006    bilateral elbows  . Fracture surgery     right knee  . Fracture surgery     tib plateau  . Eye surgery     cataract both eyes    Family History  Problem Relation Age of Onset  . COPD Father   . Diabetes Sister   . Vision loss Sister     legally blind  . Asthma Mother   . Heart disease Father     History  Substance Use Topics  . Smoking status: Never Smoker   . Smokeless tobacco: Never Used  . Alcohol Use: No    OB History    Grav Para Term Preterm Abortions TAB SAB Ect Mult Living                  Review of Systems  All other systems reviewed and are negative.  Allergies  Codeine sulfate and Penicillins  Home Medications   Current Outpatient Rx  Name Route Sig Dispense Refill  . ALPRAZOLAM 0.5 MG PO TABS Oral Take 0.5 mg by mouth 2 (two) times daily.    . ASPIRIN 81 MG PO TABS Oral Take 81 mg by mouth daily.     Marland Kitchen GRAPE SEED PO Oral Take 1 tablet by mouth daily.    Marland Kitchen CALCIUM CARBONATE-VITAMIN D 600-400 MG-UNIT PO CHEW Oral Chew 1 tablet by mouth 2 (two) times daily.    Marland Kitchen CARBIDOPA-LEVODOPA 25-100 MG PO TABS Oral Take 3 tablets by mouth 3 (three) times daily.    Marland Kitchen CETIRIZINE HCL 10 MG PO TABS Oral Take 10 mg by mouth at bedtime.      Marland Kitchen VITAMIN D3 1000 UNITS PO CAPS Oral Take 1 capsule by mouth daily.      Marland Kitchen FLUOXETINE HCL 20 MG PO TABS Oral Take 40 mg by mouth  daily. 2 tabs by mouth daily.    Marland Kitchen LOSARTAN POTASSIUM-HCTZ 50-12.5 MG PO TABS Oral Take 1 tablet by mouth daily.    . CENTRUM SILVER PO Oral Take 1 tablet by mouth daily.      Marland Kitchen SIMVASTATIN 80 MG PO TABS Oral Take 1 tablet (80 mg total) by mouth at bedtime. 90 tablet 3  . VITAMIN B-12 1000 MCG PO TABS Oral Take 1,000 mcg by mouth daily.        BP 108/56  Pulse 72  Temp(Src) 97.7 F (36.5 C) (Oral)  Resp 22  SpO2 100%  Physical Exam  Nursing note and vitals reviewed. Constitutional: She is oriented to person, place, and time. She appears well-developed and well-nourished. No distress.  HENT:  Head: Normocephalic and atraumatic.  Mouth/Throat: Oropharynx is clear and moist. No oropharyngeal exudate.  Eyes:       Normal appearance.  Pink conjunctiva  Neck: Normal range of motion.  Cardiovascular: Normal rate, regular rhythm and intact distal pulses.   Pulmonary/Chest: Effort normal and breath sounds normal.  Abdominal: Soft. Bowel sounds are normal. She exhibits no distension. There is no tenderness.  Musculoskeletal: Normal range of motion.  Lymphadenopathy:    She has no cervical adenopathy.  Neurological: She is alert and oriented to person, place, and time. No sensory deficit. Coordination normal.       CN 3-12 intact w/ exception of left facial droop which is chronic/stable and residual to Bell's Palsy.  No nystagmus. 5/5 and equal upper and lower extremity strength.  No past pointing.     Skin: Skin is warm and dry. No rash noted.  Psychiatric: She has a normal mood and affect. Her behavior is normal.    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  Narrative Interpretation:   Old EKG Reviewed: changes noted (new RBBB)   Labs Reviewed  CBC - Abnormal;  Notable for the following:    WBC 10.7 (*)    All other components within normal limits  DIFFERENTIAL - Abnormal; Notable for the following:    Neutrophils Relative 83 (*)    Neutro Abs 9.0 (*)    Lymphocytes Relative 10 (*)    All other components within normal limits  BASIC METABOLIC PANEL - Abnormal; Notable for the following:    Glucose, Bld 155 (*)  All other components within normal limits  POCT I-STAT TROPONIN I  URINALYSIS, ROUTINE W REFLEX MICROSCOPIC   Ct Head Wo Contrast  12/06/2011  *RADIOLOGY REPORT*  Clinical Data: Fall with head trauma.  Headache.  CT HEAD WITHOUT CONTRAST  Technique:  Contiguous axial images were obtained from the base of the skull through the vertex without contrast.  Comparison: 04/23/2011.  09/08/2009.  Findings: The brain shows generalized atrophy.  This is somewhat progressive over time.  Ventricles are prominent, but in proportion to the degree of generalized atrophy.  There are chronic small vessel changes throughout the white matter.  No sign of acute infarction, mass lesion, hemorrhage, hydrocephalus or extra-axial collection.  No skull fracture.  No fluid in the sinuses, middle ears or mastoids.  There have been previous nasoantral windows.  IMPRESSION: Atrophy and chronic small vessel disease.  Findings are progressive over time.  No acute or traumatic finding.  Original Report Authenticated By: Thomasenia Sales, M.D.     1. Syncope       MDM  [phone] F w/ h/o Parkinson's disease w/ intermittent falls (more frequent recently) as well as syncope, for which she has been evaluated by a cardiologist several years ago but etiology unknown, presents w/ fall (possibly secondary to syncope), followed by a witnessed syncopal episode this morning.  Pt has been depressed and eats/drinks very little.  Afebrile, well hydrated, heart w/ RRR, lungs clear, no focal neuro deficits on exam.  EKG sinus w/out ischemia and labs unremarkable w/ exception of U/A which is  pending.  CT head ordered because hit head during fall, followed by LOC and c/o posterior headache, but neg for acute pathology.    U/A shows possible UTI.  Sent for culture.  Results discussed w/ pt and her family.   Pt admitted to [facility] to r/o cardiac cause of syncope.  Suspect component of failure to thrive secondary to depression.  10:43 AM       Otilio Miu, PA 12/06/11 1044

## 2011-12-06 NOTE — H&P (Signed)
[facility] History and Physical  [name] QMV:[id number] DOB: 1945/12/16 DOA: 12/06/2011   PCP: [name], MD, MD   Chief Complaint: passed out   HPI:   Pt reports that she went into the kitchen at 6:30am to get something out of the refrigerator, became lightheaded and then fell. She does not recall whether or not she lost consciousness. She called for her daughter who was taking a shower and her daughter found her sitting against kitchen door. She believes she hit her head. Pt denies having CP/SOB/palpitations today and no recent fever, cough, vomiting, diarrhea, blood in stool or urinary sx.  Had a fall approx one week ago as well. She has hx of  h/o parkinson's w/ intermittent falls; seem to be more frequent now. Also has h/o syncope for which she has been evaluated by a cardiologist (most recently 2 years ago) and etiology unknown. Most recent syncopal episode several months ago.    Review of Systems:  C/o tremors, frequent falls, occasional vomiting, all other systems reviewed and negative except  per HPI    BP sitting 125/70 - standing 114/70 Past Medical History  Diagnosis Date  . Dysmetabolic syndrome X   . Personal history of thrombophlebitis   . Essential hypertension, benign   . Morbid obesity   . Mixed hyperlipidemia   . Nonspecific abnormal electrocardiogram (ECG) (EKG)   . Special screening for malignant neoplasms of other sites   . Depressive disorder, not elsewhere classified   . Type II or unspecified type diabetes mellitus without mention of complication, not stated as uncontrolled   . Bell's palsy   . Need for prophylactic hormone replacement therapy (postmenopausal)   . Adjustment disorder with depressed mood   . Need for prophylactic vaccination and inoculation against influenza    Past Surgical History  Procedure Date  . Tonsillectomy and adenoidectomy 1970  . Bladder tack 11/1995  . Septoplasty 08/1998    with antral window   .  Breast cyst aspiration 12/1998  . Breast biopsy 05/20/2000  . Knee arthroscopy 12/12/2002    right   . Fracture surgery 02/09/2006    bilateral elbows  . Fracture surgery     right knee  . Fracture surgery     tib plateau  . Eye surgery     cataract both eyes   Social History:  reports that she has never smoked. She has never used smokeless tobacco. She reports that she does not drink alcohol or use illicit drugs. Works as an Airline pilot  Allergies  Allergen Reactions  . Codeine Sulfate     REACTION: hallucinations  . Penicillins Anaphylaxis, Swelling and Rash    Family History  Problem Relation Age of Onset  . COPD Father   . Diabetes Sister   . Vision loss Sister     legally blind  . Asthma Mother   . Heart disease Father     Prior to Admission medications   Medication Sig Start Date End Date Taking? Authorizing Provider  ALPRAZolam Prudy Feeler) 0.5 MG tablet Take 0.5 mg by mouth 2 (two) times daily. 08/18/11  Yes Dianne Dun, MD  aspirin 81 MG tablet Take 81 mg by mouth daily.    Yes Historical Provider, MD  Bioflavonoid Products (GRAPE SEED PO) Take 1 tablet by mouth daily.   Yes Historical Provider, MD  Calcium Carbonate-Vitamin D (CALTRATE 600+D) 600-400 MG-UNIT per chew tablet Chew 1 tablet by mouth 2 (two) times daily.   Yes Historical Provider,  MD  carbidopa-levodopa (SINEMET IR) 25-100 MG per tablet Take 3 tablets by mouth 3 (three) times daily. 10/19/11  Yes Milas Gain, MD  cetirizine (ZYRTEC) 10 MG tablet Take 10 mg by mouth at bedtime.     Yes Historical Provider, MD  Cholecalciferol (VITAMIN D3) 1000 UNITS CAPS Take 1 capsule by mouth daily.     Yes Historical Provider, MD  FLUoxetine (PROZAC) 20 MG tablet Take 40 mg by mouth daily. 2 tabs by mouth daily. 06/10/11  Yes Dianne Dun, MD  losartan-hydrochlorothiazide (HYZAAR) 50-12.5 MG per tablet Take 1 tablet by mouth daily.   Yes Historical Provider, MD  Multiple Vitamins-Minerals (CENTRUM SILVER PO) Take 1  tablet by mouth daily.     Yes Historical Provider, MD  simvastatin (ZOCOR) 80 MG tablet Take 1 tablet (80 mg total) by mouth at bedtime. 12/29/10  Yes Dianne Dun, MD  vitamin B-12 (CYANOCOBALAMIN) 1000 MCG tablet Take 1,000 mcg by mouth daily.     Yes Historical Provider, MD   Physical Exam: Filed Vitals:   12/06/11 1354 12/06/11 1356 12/06/11 1358 12/06/11 1402  BP: 116/67 125/70 114/67 109/70  Pulse: 64 65 64 68  Temp: 97 F (36.1 C)     TempSrc: Oral     Resp: 18     Height:      Weight:      SpO2: 100% 100% 100% 100%     General:  Alert and oriented x3  Eyes: perrla  ENT: left facial palsy   Neck: no JVD  Cardiovascular: RRR, no murmurs rubs or gallops  Respiratory: CTAB  Abdomen: soft, NT, BS present  Skin: no rash  Musculoskeletal: intact  Psychiatric: euthymic, labile  Neurologic: tremors, strength 5/5  Labs on Admission:  Basic Metabolic Panel:  Lab 12/06/11 1610  NA 137  K 3.6  CL 98  CO2 27  GLUCOSE 155*  BUN 20  CREATININE 0.56  CALCIUM 10.1  MG --  PHOS --   Liver Function Tests: No results found for this basename: AST:5,ALT:5,ALKPHOS:5,BILITOT:5,PROT:5,ALBUMIN:5 in the last 168 hours No results found for this basename: LIPASE:5,AMYLASE:5 in the last 168 hours No results found for this basename: AMMONIA:5 in the last 168 hours CBC:  Lab 12/06/11 0813  WBC 10.7*  NEUTROABS 9.0*  HGB 12.8  HCT 38.0  MCV 93.4  PLT 181   Cardiac Enzymes:  Lab 12/06/11 1325  CKTOTAL 50  CKMB 1.6  CKMBINDEX --  TROPONINI <0.30   BNP: No components found with this basename: POCBNP:5 CBG: No results found for this basename: GLUCAP:5 in the last 168 hours  Radiological Exams on Admission: Ct Head Wo Contrast  12/06/2011  *RADIOLOGY REPORT*  Clinical Data: Fall with head trauma.  Headache.  CT HEAD WITHOUT CONTRAST  Technique:  Contiguous axial images were obtained from the base of the skull through the vertex without contrast.  Comparison:  04/23/2011.  09/08/2009.  Findings: The brain shows generalized atrophy.  This is somewhat progressive over time.  Ventricles are prominent, but in proportion to the degree of generalized atrophy.  There are chronic small vessel changes throughout the white matter.  No sign of acute infarction, mass lesion, hemorrhage, hydrocephalus or extra-axial collection.  No skull fracture.  No fluid in the sinuses, middle ears or mastoids.  There have been previous nasoantral windows.  IMPRESSION: Atrophy and chronic small vessel disease.  Findings are progressive over time.  No acute or traumatic finding.  Original Report Authenticated By: Thomasenia Sales,  M.D.   EKG: Independently reviewed. SINUS RHYTHM , INCOMPLETE RBBB AND LAFB  Assessment/Plan 1. Syncope - ? etiology - most likely orthostasis probably exacerbated by Parkinson ds and sinemet. Will monitor overnight on telemetry. Ambulate and f/u closely 2. ? UTI - send cx will start abx if febrile or culture positive 3. PD - c/w sinemet   Code Status: full Family Communication:  Disposition Plan: home  Zenaida Tesar , MD  [facility] Pager [phone]   If 7PM-7AM, please contact night-coverage [email or web address] Password [facility] 12/06/2011, 4:53 PM

## 2011-12-06 NOTE — ED Notes (Signed)
Pt back from CT

## 2011-12-06 NOTE — ED Notes (Signed)
Pt's daughter now reports that after the fall this morning, she got her mother in a chair then she believes that she past out and had to keep her mother from falling again. Pt's daughter also reports noticing a decreased appetite in her mother. Pt doesn't remember losing consciousness.

## 2011-12-07 ENCOUNTER — Observation Stay (HOSPITAL_COMMUNITY): Payer: 59

## 2011-12-07 DIAGNOSIS — R1310 Dysphagia, unspecified: Secondary | ICD-10-CM

## 2011-12-07 DIAGNOSIS — I951 Orthostatic hypotension: Secondary | ICD-10-CM

## 2011-12-07 DIAGNOSIS — G510 Bell's palsy: Secondary | ICD-10-CM | POA: Diagnosis present

## 2011-12-07 DIAGNOSIS — G20 Parkinson's disease: Secondary | ICD-10-CM

## 2011-12-07 DIAGNOSIS — R55 Syncope and collapse: Secondary | ICD-10-CM

## 2011-12-07 HISTORY — DX: Dysphagia, unspecified: R13.10

## 2011-12-07 HISTORY — DX: Orthostatic hypotension: I95.1

## 2011-12-07 LAB — URINE CULTURE
Colony Count: NO GROWTH
Culture  Setup Time: 201305131126
Culture: NO GROWTH

## 2011-12-07 LAB — CARDIAC PANEL(CRET KIN+CKTOT+MB+TROPI)
CK, MB: 1.3 ng/mL (ref 0.3–4.0)
Relative Index: INVALID (ref 0.0–2.5)
Total CK: 33 U/L (ref 7–177)
Troponin I: <0.3 ng/mL (ref <0.30)

## 2011-12-07 MED ORDER — ENSURE COMPLETE PO LIQD
237.0000 mL | Freq: Two times a day (BID) | ORAL | Status: DC
  Administered 2011-12-07: 237 mL via ORAL

## 2011-12-07 NOTE — Progress Notes (Signed)
UR Completed. Simmons, Aubreigh Fuerte F [phone]  

## 2011-12-07 NOTE — Progress Notes (Signed)
[facility] PROGRESS NOTE  [name] ZOX:[id number] DOB: 1945/10/12 DOA: 12/06/2011 PCP: [name], MD, MD  Assessment/Plan: 1. Syncope - based on history and evaluation sofar we think she may have had some positional orthostasis. Echocardiogram is OK. Start TEDs. Liberal salt diet 2. Dysphagia = ? Esophageal dysmotility - ordered barium esophagram  3. Parkinson's disease - c/w sinemet 4. HL = on statin   Principal Problem:  *Syncope Active Problems:  Orthostasis  HYPERLIPIDEMIA, MIXED  DISORDER, DEPRESSIVE NEC  HYPERTENSION, BENIGN ESSENTIAL  Dysphagia  Bell's palsy  Parkinson disease    Code Status: full  Lonia Blood, MD  [facility] Pager [phone]  If 7PM-7AM, please contact night-coverage [email or web address] Password [email or web address] 12/07/2011, 1:52 PM   LOS: 1 day    Consultants:  [facility]   Procedures:    Antibiotics:    HPI/Subjective: C/o difficulty swallowing - regurgitated all lunch. No further syncope   Objective: Filed Vitals:   12/06/11 1358 12/06/11 1402 12/06/11 1951 12/07/11 0514  BP: 114/67 109/70 118/71 112/54  Pulse: 64 68 66 62  Temp:   98.8 F (37.1 C) 97 F (36.1 C)  TempSrc:   Oral Oral  Resp:   18 16  Height:      Weight:      SpO2: 100% 100% 95% 95%    Intake/Output Summary (Last 24 hours) at 12/07/11 1352 Last data filed at 12/07/11 1100  Gross per 24 hour  Intake 1191.67 ml  Output      0 ml  Net 1191.67 ml    Exam:   General:  Alert and oriented   Cardiovascular: RRR, no murmurs rubs or gallops  Respiratory: CTAB  Abdomen: soft, NT Data Reviewed: Basic Metabolic Panel:  Lab 12/06/11 8295  NA 137  K 3.6  CL 98  CO2 27  GLUCOSE 155*  BUN 20  CREATININE 0.56  CALCIUM 10.1  MG --  PHOS --   Liver Function Tests: No results found for this basename: AST:5,ALT:5,ALKPHOS:5,BILITOT:5,PROT:5,ALBUMIN:5 in the last 168 hours No results found for this basename:  LIPASE:5,AMYLASE:5 in the last 168 hours No results found for this basename: AMMONIA:5 in the last 168 hours CBC:  Lab 12/06/11 0813  WBC 10.7*  NEUTROABS 9.0*  HGB 12.8  HCT 38.0  MCV 93.4  PLT 181   Cardiac Enzymes:  Lab 12/07/11 0500 12/06/11 2020 12/06/11 1325  CKTOTAL 33 40 50  CKMB 1.3 1.3 1.6  CKMBINDEX -- -- --  TROPONINI <0.30 <0.30 <0.30   BNP: No components found with this basename: POCBNP:5 CBG: No results found for this basename: GLUCAP:5 in the last 168 hours  Recent Results (from the past 240 hour(s))  URINE CULTURE     Status: Normal   Collection Time   12/06/11  9:16 AM      Component Value Range Status Comment   Specimen Description URINE, CATHETERIZED   Final    Special Requests NONE   Final    Culture  Setup Time [id number]   Final    Colony Count NO GROWTH   Final    Culture NO GROWTH   Final    Report Status 12/07/2011 FINAL   Final      Studies: Ct Head Wo Contrast  12/06/2011  *RADIOLOGY REPORT*  Clinical Data: Fall with head trauma.  Headache.  CT HEAD WITHOUT CONTRAST  Technique:  Contiguous axial images were obtained from the base of the skull through the vertex without contrast.  Comparison: 04/23/2011.  09/08/2009.  Findings: The brain shows generalized atrophy.  This is somewhat progressive over time.  Ventricles are prominent, but in proportion to the degree of generalized atrophy.  There are chronic small vessel changes throughout the white matter.  No sign of acute infarction, mass lesion, hemorrhage, hydrocephalus or extra-axial collection.  No skull fracture.  No fluid in the sinuses, middle ears or mastoids.  There have been previous nasoantral windows.  IMPRESSION: Atrophy and chronic small vessel disease.  Findings are progressive over time.  No acute or traumatic finding.  Original Report Authenticated By: Thomasenia Sales, M.D.    Scheduled Meds:   . ALPRAZolam  0.5 mg Oral BID  . aspirin EC  81 mg Oral Daily  . atorvastatin  40 mg  Oral q1800  . calcium-vitamin D  1 tablet Oral BID  . carbidopa-levodopa  3 tablet Oral TID  . cholecalciferol  1,000 Units Oral Daily  . enoxaparin  40 mg Subcutaneous Q24H  . FLUoxetine  40 mg Oral Daily  . sodium chloride  3 mL Intravenous Q12H  . vitamin B-12  1,000 mcg Oral Daily   Continuous Infusions:   . DISCONTD: sodium chloride 50 mL/hr at 12/06/11 2225

## 2011-12-07 NOTE — Progress Notes (Signed)
INITIAL ADULT NUTRITION ASSESSMENT Date: 12/07/2011   Time: 3:11 PM  Reason for Assessment: Nutrition Risk Report  ASSESSMENT: Female 66 y.o.  Dx: Syncope  Hx:  Past Medical History  Diagnosis Date  . Dysmetabolic syndrome X   . Personal history of thrombophlebitis   . Essential hypertension, benign   . Morbid obesity   . Mixed hyperlipidemia   . Nonspecific abnormal electrocardiogram (ECG) (EKG)   . Special screening for malignant neoplasms of other sites   . Depressive disorder, not elsewhere classified   . Bell's palsy   . Need for prophylactic hormone replacement therapy (postmenopausal)   . Adjustment disorder with depressed mood   . Need for prophylactic vaccination and inoculation against influenza   . DVT (deep venous thrombosis), left "early 2000's"    LLE  . Syncope and collapse 12/06/11    "this am; I pass out fairly often"  . Type II or unspecified type diabetes mellitus without mention of complication, not stated as uncontrolled 12/06/11    "not anymore; had lap band OR"  . Neurodegenerative gait disorder   . Headache 05/2011    "pretty often since they put me on Parkinson's medicine"  . Parkinson disease     "they think that's what it is"  . DEMENTIA   . 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 Anxiety   . History of shingles     "as a teen and in my 50's"    Related Meds:     . ALPRAZolam  0.5 mg Oral BID  . aspirin EC  81 mg Oral Daily  . atorvastatin  40 mg Oral q1800  . calcium-vitamin D  1 tablet Oral BID  . carbidopa-levodopa  3 tablet Oral TID  . cholecalciferol  1,000 Units Oral Daily  . enoxaparin  40 mg Subcutaneous Q24H  . FLUoxetine  40 mg Oral Daily  . sodium chloride  3 mL Intravenous Q12H  . vitamin B-12  1,000 mcg Oral Daily    Ht: 5\' 1"  (154.9 cm)  Wt: 151 lb 3.8 oz (68.6 kg)  Ideal Wt: 47.7 kg % Ideal Wt: 144%  Usual Wt: 158 lb -- per office visit record 11/25/11 % Usual Wt: 95%  Body mass  index is 28.58 kg/(m^2).  Food/Nutrition Related Hx: problems chewing or swallowing foods and/or liquids per admission nutrition screen  Labs:  CMP     Component Value Date/Time   NA 137 12/06/2011 0813   K 3.6 12/06/2011 0813   CL 98 12/06/2011 0813   CO2 27 12/06/2011 0813   GLUCOSE 155* 12/06/2011 0813   BUN 20 12/06/2011 0813   CREATININE 0.56 12/06/2011 0813   CALCIUM 10.1 12/06/2011 0813   PROT 6.3 09/14/2011 0804   ALBUMIN 3.8 09/14/2011 0804   AST 19 09/14/2011 0804   ALT 14 09/14/2011 0804   ALKPHOS 54 09/14/2011 0804   BILITOT 0.5 09/14/2011 0804   GFRNONAA >90 12/06/2011 0813   GFRAA >90 12/06/2011 0813     Intake/Output Summary (Last 24 hours) at 12/07/11 1514 Last data filed at 12/07/11 1100  Gross per 24 hour  Intake 1071.67 ml  Output      0 ml  Net 1071.67 ml     Diet Order: General  Supplements/Tube Feeding: N/A  IVF:    DISCONTD: sodium chloride Last Rate: 50 mL/hr at 12/06/11 2225    Estimated Nutritional Needs:   Kcal: 1700-1900 Protein: 80-90 gm Fluid: 1.7-1.9  L  Patient admitted after fall at home; reports a poor appetite and that food does not have a taste or smell; reports that she has swallowing trouble and food "comes back up;" s/p bedside swallow evaluation 5/14; per SLP assessment, patient presents with complaints and signs consistent with esophageal dysphagia -- recommending regular, thin liquid consistencies; s/p lap band surgery in 2008; current PO intake variable at 15-50% per flowsheet records; patient drinks Ensure at home and would like between meals during her hospitalization -- RD to order  NUTRITION DIAGNOSIS: -Inadequate oral intake (NI-2.1).  Status: Ongoing  RELATED TO: esophageal dysphagia  AS EVIDENCE BY: PO intake 15-50%  MONITORING/EVALUATION(Goals): Goal: meet >90% of estimated nutrition needs Monitor: PO intake, weight, labs, I/O's  EDUCATION NEEDS: -No education needs identified at this time  INTERVENTION:  Add Ensure  Complete PO BID (350 kcals, 13 gm protein per 8 fl oz bottle)   RD to follow for nutrition care plan  Dietitian #: [phone]  DOCUMENTATION CODES Per approved criteria  -Not Applicable    Alger Memos 12/07/2011, 3:11 PM

## 2011-12-07 NOTE — Evaluation (Signed)
Clinical/Bedside Swallow Evaluation Patient Details  Name: KALEESI GUYTON MRN: [id number] Date of Birth: 1946-02-11  Today's Date: 12/07/2011 Time: 1227-1246 SLP Time Calculation (min): 19 min  Past Medical History:  Past Medical History  Diagnosis Date  . Dysmetabolic syndrome X   . Personal history of thrombophlebitis   . Essential hypertension, benign   . Morbid obesity   . Mixed hyperlipidemia   . Nonspecific abnormal electrocardiogram (ECG) (EKG)   . Special screening for malignant neoplasms of other sites   . Depressive disorder, not elsewhere classified   . Bell's palsy   . Need for prophylactic hormone replacement therapy (postmenopausal)   . Adjustment disorder with depressed mood   . Need for prophylactic vaccination and inoculation against influenza   . DVT (deep venous thrombosis), left "early 2000's"    LLE  . Syncope and collapse 12/06/11    "this am; I pass out fairly often"  . Type II or unspecified type diabetes mellitus without mention of complication, not stated as uncontrolled 12/06/11    "not anymore; had lap band OR"  . Neurodegenerative gait disorder   . Headache 05/2011    "pretty often since they put me on Parkinson's medicine"  . Parkinson disease     "they think that's what it is"  . DEMENTIA   . 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 Anxiety   . History of shingles     "as a teen and in my 85's"   Past Surgical History:  Past Surgical History  Procedure Date  . Tonsillectomy and adenoidectomy 1970  . Bladder tack 11/1995  . Septoplasty 08/1998    with antral window   . Knee arthroscopy 12/12/2002    right   . Fracture surgery 02/09/2006    bilateral elbows  . Fracture surgery     right knee  . Fracture surgery     tib plateau  . Breast cyst aspiration 12/1998    bilaterally  . Breast biopsy 05/20/2000    bilaterally  . Tubal ligation 1970's  . Cataract extraction w/ intraocular lens  implant,  bilateral   . Laparoscopic gastric banding 2008  . Cardiac catheterization 2004   HPI:  Pt is a 66 year old female admitted for falls associated iwth Parkinsons disease. Pt was about to discharge home today when she mentioned to MD that she has had trouble swallowing and her neurologist wanted her to have a swallow evaluation in the hospital due to chronic Bell's Palsy. When questioned by SLP pt did not describe any difficutly eating related to facial nerve deficits, but did reprot that she often regurgitates solids and liquids directly after swallowing and that this has been happening for a year. Pt with mucous-like regurgitated POs on tray. She reports that she has mostly been drinking ensure for one year. She also reports having has lap band surgery in 2008. She has not had any recent pna. She does report that she has been seen by Neuro[facility]hab outpatient SLP for dysarthria related to Parkinsons. Discussed with this therapist who reports that pt had been demonstrated some coughing following presentations of solids but has not regurgitated POs.    Assessment / Plan / Recommendation Clinical Impression  Pt presents with complaints and signs consistent with esophageal dysphagia including regurgitation of solid POs with most meals. Pt did not demosntrate any overt s/s of aspiration and has not suffered from pna in her recent memory. Feel pt would  benefit from an esophageal assessment prior to discharge given nature of complaints. Discussed with MD. Will continue to follow for education regarding esophageal precautions if needed.     Aspiration Risk  Mild    Diet Recommendation Regular;Thin liquid   Other  Recommendations Consider esophageal assessment   Follow Up Recommendations  None    Frequency and Duration        Pertinent Vitals/Pain NA    SLP Swallow Goals Goal #3: Pt will verbalize understanding of esophageal precautions following barium swallow with min verbal cues   Swallow  Study Prior Functional Status       General HPI: Pt is a 66 year old female admitted for falls associated iwth Parkinsons disease. Pt was about to discharge home today when she mentioned to MD that she has had trouble swallowing and her neurologist wanted her to have a swallow evaluation in the hospital due to chronic Bell's Palsy. When questioned by SLP pt did not describe any difficutly eating related to facial nerve deficits, but did reprot that she often regurgitates solids and liquids directly after swallowing and that this has been happening for a year. Pt with mucous-like regurgitated POs on tray. She reports that she has mostly been drinking ensure for one year. She also reports having has lap band surgery in 2008. She has not had any recent pna. She does report that she has been seen by Neurorehab outpatient SLP for dysarthria related to Parkinsons. Discussed with this therapist who reports that pt had been demonstrated some coughing following presentations of solids but has not regurgitated POs.  Type of Study: Bedside swallow evaluation Diet Prior to this Study: Regular;Thin liquids Temperature Spikes Noted: No Respiratory Status: Room air History of Intubation: No Behavior/Cognition: Alert;Cooperative;Pleasant mood Oral Cavity - Dentition: Adequate natural dentition Vision: Functional for self-feeding Patient Positioning: Upright in chair Baseline Vocal Quality: Clear Volitional Cough: Strong Volitional Swallow: Able to elicit    Oral/Motor/Sensory Function Overall Oral Motor/Sensory Function: Impaired at baseline Labial ROM: Reduced left Labial Symmetry: Abnormal symmetry left Labial Strength: Reduced Lingual ROM: Within Functional Limits Lingual Symmetry: Within Functional Limits Lingual Strength: Within Functional Limits Lingual Sensation: Within Functional Limits Facial ROM: Reduced left Facial Symmetry: Left droop;Left drooping eyelid Facial Strength: Reduced Facial  Sensation: Reduced Velum: Within Functional Limits Mandible: Within Functional Limits   Ice Chips Ice chips: Not tested   Thin Liquid Thin Liquid: Within functional limits Presentation: Cup    Nectar Thick Nectar Thick Liquid: Not tested   Honey Thick Honey Thick Liquid: Not tested   Puree Puree: Not tested   Solid Solid: Not tested Other Comments: Pt refused    Amani Nodarse, Riley Nearing 12/07/2011,2:10 PM

## 2011-12-07 NOTE — Discharge Summary (Signed)
Physician Discharge Summary  [name]:[id number] DOB: April 03, 1946 DOA: 12/06/2011  PCP: [name], MD, MD  Admit date: 12/06/2011 Discharge date: 12/07/2011  Discharge Diagnoses:  1. Syncope 2. Orthostasis 3. Parkinsons disease 4. Bells palsy 5. Chronic intermittent dysphagia   Discharge Condition: back to baseline  Diet recommendation: regular  History of present illness:  Pt reports that she went into the kitchen at 6:30am to get something out of the refrigerator, became lightheaded and then fell. She does not recall whether or not she lost consciousness. She called for her daughter who was taking a shower and her daughter found her sitting against kitchen door. She believes she hit her head. Pt denies having CP/SOB/palpitations today and no recent fever, cough, vomiting, diarrhea, blood in stool or urinary sx. Had a fall approx one week ago as well. She has hx of h/o parkinson's w/ intermittent falls; seem to be more frequent now. Also has h/o syncope for which she has been evaluated by a cardiologist (most recently 2 years ago) and etiology unknown. Most recent syncopal episode several months ago.      Hospital Course:  66 yo woman admitted with syncope. She was observed on telemetry overnight without arrythmia noted. Echocardiogram was normal. Cardiologist evaluated the patient and did not make any new recommendations. She was mildly orthostatic so we initiated compression stockings and recommended her to continue outpatient PT for balance.  She had also a speech swallow eval and a barium swallow without major correctable abnormalities identified.   Procedures:  Barium swallow  echocardiogram  Consultations:  [facility] cardiology  Discharge Exam: Filed Vitals:   12/07/11 1407  BP: 121/73  Pulse: 68  Temp: 98.2 F (36.8 C)  Resp: 18   Filed Vitals:   12/06/11 1402 12/06/11 1951 12/07/11 0514 12/07/11 1407  BP: 109/70 118/71 112/54 121/73  Pulse: 68 66 62  68  Temp:  98.8 F (37.1 C) 97 F (36.1 C) 98.2 F (36.8 C)  TempSrc:  Oral Oral Oral  Resp:  18 16 18   Height:      Weight:      SpO2: 100% 95% 95% 95%   General: alert and oriented x3 Cardiovascular: RRR Respiratory: CTAB  Discharge Instructions  Discharge Orders    Future Appointments: Provider: Department: Dept Phone: Center:   12/10/2011 8:15 AM Barron Alvine, CCC-SLP [facility] [phone] [facility]   01/19/2012 11:00 AM Milas Gain, MD [facility] [phone] None     Future Orders Please Complete By Expires   Diet general      Increase activity slowly        Medication List  As of 12/07/2011  5:56 PM   TAKE these medications         ALPRAZolam 0.5 MG tablet   Commonly known as: XANAX   Take 0.5 mg by mouth 2 (two) times daily.      aspirin 81 MG tablet   Take 81 mg by mouth daily.      CALTRATE 600+D 600-400 MG-UNIT per chew tablet   Generic drug: Calcium Carbonate-Vitamin D   Chew 1 tablet by mouth 2 (two) times daily.      carbidopa-levodopa 25-100 MG per tablet   Commonly known as: SINEMET IR   Take 3 tablets by mouth 3 (three) times daily.      CENTRUM SILVER PO   Take 1 tablet by mouth daily.      cetirizine 10 MG tablet   Commonly known as:  ZYRTEC   Take 10 mg by mouth at bedtime.      FLUoxetine 20 MG tablet   Commonly known as: PROZAC   Take 40 mg by mouth daily. 2 tabs by mouth daily.      GRAPE SEED PO   Take 1 tablet by mouth daily.      losartan-hydrochlorothiazide 50-12.5 MG per tablet   Commonly known as: HYZAAR   Take 1 tablet by mouth daily.      simvastatin 80 MG tablet   Commonly known as: ZOCOR   Take 1 tablet (80 mg total) by mouth at bedtime.      vitamin B-12 1000 MCG tablet   Commonly known as: CYANOCOBALAMIN   Take 1,000 mcg by mouth daily.      Vitamin D3 1000 UNITS Caps   Take 1 capsule by mouth daily.              The results of significant diagnostics from this hospitalization (including  imaging, microbiology, ancillary and laboratory) are listed below for reference.    Significant Diagnostic Studies: Ct Head Wo Contrast  12/06/2011  *RADIOLOGY REPORT*  Clinical Data: Fall with head trauma.  Headache.  CT HEAD WITHOUT CONTRAST  Technique:  Contiguous axial images were obtained from the base of the skull through the vertex without contrast.  Comparison: 04/23/2011.  09/08/2009.  Findings: The brain shows generalized atrophy.  This is somewhat progressive over time.  Ventricles are prominent, but in proportion to the degree of generalized atrophy.  There are chronic small vessel changes throughout the white matter.  No sign of acute infarction, mass lesion, hemorrhage, hydrocephalus or extra-axial collection.  No skull fracture.  No fluid in the sinuses, middle ears or mastoids.  There have been previous nasoantral windows.  IMPRESSION: Atrophy and chronic small vessel disease.  Findings are progressive over time.  No acute or traumatic finding.  Original Report Authenticated By: Thomasenia Sales, M.D.   Dg Esophagus  12/07/2011  *RADIOLOGY REPORT*  Clinical Data: Reflux.  Dysphagia.  Prior lap band surgery.  ESOPHOGRAM/BARIUM SWALLOW  Technique:  Single contrast examination was performed using thin barium.  Fluoroscopy time:  1.2 minutes slow pulsed fluoroscopy.  Comparison:  Upper GI dated 07/29/2006  Findings:  The oropharyngeal swallowing mechanisms are normal. Valleculae and piriform sinuses are bilaterally symmetrical and normal.  The mucosa of the esophagus is normal.  The patient does have intermittent reversed peristalsis in the distal esophagus. The thin barium does pass through the lap band narrowing into the stomach with minimal delay.  The lap band appears normally oriented.  No strictures.  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  This then relaxes and the contrast passes through the lap band into the  stomach with no significant delay.  Original Report Authenticated By: Gwynn Burly, M.D.    Microbiology: Recent Results (from the past 240 hour(s))  URINE CULTURE     Status: Normal   Collection Time   12/06/11  9:16 AM      Component Value Range Status Comment   Specimen Description URINE, CATHETERIZED   Final    Special Requests NONE   Final    Culture  Setup Time [id number]   Final    Colony Count NO GROWTH   Final    Culture NO GROWTH   Final    Report Status 12/07/2011 FINAL   Final      Labs: Basic Metabolic Panel:  Lab 12/06/11 0813  NA 137  K 3.6  CL 98  CO2 27  GLUCOSE 155*  BUN 20  CREATININE 0.56  CALCIUM 10.1  MG --  PHOS --   Liver Function Tests: No results found for this basename: AST:5,ALT:5,ALKPHOS:5,BILITOT:5,PROT:5,ALBUMIN:5 in the last 168 hours No results found for this basename: LIPASE:5,AMYLASE:5 in the last 168 hours No results found for this basename: AMMONIA:5 in the last 168 hours CBC:  Lab 12/06/11 0813  WBC 10.7*  NEUTROABS 9.0*  HGB 12.8  HCT 38.0  MCV 93.4  PLT 181   Cardiac Enzymes:  Lab 12/07/11 0500 12/06/11 2020 12/06/11 1325  CKTOTAL 33 40 50  CKMB 1.3 1.3 1.6  CKMBINDEX -- -- --  TROPONINI <0.30 <0.30 <0.30   BNP: No components found with this basename: POCBNP:5 CBG: No results found for this basename: GLUCAP:5 in the last 168 hours    Signed:  Lonia Blood, MD  [facility] 12/07/2011, 5:56 PM

## 2011-12-14 ENCOUNTER — Other Ambulatory Visit: Payer: Self-pay | Admitting: Family Medicine

## 2011-12-14 DIAGNOSIS — Z1231 Encounter for screening mammogram for malignant neoplasm of breast: Secondary | ICD-10-CM

## 2012-01-07 ENCOUNTER — Ambulatory Visit (HOSPITAL_COMMUNITY): Payer: 59 | Admitting: Psychiatry

## 2012-01-19 ENCOUNTER — Encounter: Payer: Self-pay | Admitting: Neurology

## 2012-01-19 ENCOUNTER — Ambulatory Visit (INDEPENDENT_AMBULATORY_CARE_PROVIDER_SITE_OTHER): Payer: 59 | Admitting: Neurology

## 2012-01-19 VITALS — BP 118/70 | HR 80 | Wt 153.0 lb

## 2012-01-19 DIAGNOSIS — F0280 Dementia in other diseases classified elsewhere without behavioral disturbance: Secondary | ICD-10-CM

## 2012-01-19 DIAGNOSIS — F068 Other specified mental disorders due to known physiological condition: Secondary | ICD-10-CM

## 2012-01-19 DIAGNOSIS — G3183 Dementia with Lewy bodies: Secondary | ICD-10-CM

## 2012-01-19 NOTE — Progress Notes (Signed)
Dear Dr. Dayton Martes,   Thank you for having me see [name] in follow up today at [facility] for her problem with parkinsonism. As you may recall, she is a 66 y.o. 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  At her last visit I increased her Sinemet IR to 100/25 three tabs tid.  She does not think it has helped her gait.  She has finished her Parkinson's therapy.   Instead she thinks her gait is getting worse.  She complains of sometimes walking faster and faster.  Despite having difficulty swallowing a swallowing eval was reportedly not remarkable.  However, I cannot find the report.  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  Her daughter has taken over paying the bills, mostly because she is having significant difficulty writing.  She has started to complain of having some minor hallucinations.  Seeing "whales and kids", although these are infrequent.  Medical history, social history, and family history were reviewed and have not changed since the last clinic visit.  Current Outpatient Prescriptions on File Prior to Visit  Medication Sig Dispense Refill  . ALPRAZolam (XANAX) 0.5 MG tablet Take 0.5 mg by mouth 2 (two) times daily.      Marland Kitchen aspirin 81 MG tablet Take 81 mg by mouth daily.       Marland Kitchen Bioflavonoid Products (GRAPE SEED PO) Take 1 tablet by mouth daily.      . Calcium Carbonate-Vitamin D (CALTRATE 600+D) 600-400 MG-UNIT per chew tablet Chew 1 tablet by mouth 2 (two) times daily.      . carbidopa-levodopa (SINEMET IR) 25-100 MG per tablet Take 3 tablets by mouth 3 (three) times daily.      . cetirizine (ZYRTEC) 10 MG tablet Take 10 mg by mouth at bedtime.        . Cholecalciferol (VITAMIN D3) 1000 UNITS CAPS Take 1 capsule by mouth daily.        Marland Kitchen FLUoxetine (PROZAC) 20 MG  tablet Take 40 mg by mouth daily. 2 tabs by mouth daily.      Marland Kitchen losartan-hydrochlorothiazide (HYZAAR) 50-12.5 MG per tablet Take 1 tablet by mouth daily.      . Multiple Vitamins-Minerals (CENTRUM SILVER PO) Take 1 tablet by mouth daily.        . simvastatin (ZOCOR) 80 MG tablet Take 1 tablet (80 mg total) by mouth at bedtime.  90 tablet  3  . vitamin B-12 (CYANOCOBALAMIN) 1000 MCG tablet Take 1,000 mcg by mouth daily.          Allergies  Allergen Reactions  . Codeine Sulfate     REACTION: hallucinations  . Penicillins Anaphylaxis, Swelling and Rash    ROS:  13 systems were reviewed and are notable for no significant change in her bladder function.  All other review of systems are unremarkable.  Exam: . Filed Vitals:   01/19/12 1049  BP: 118/70  Pulse: 80  Weight: 153 lb (69.4 kg)    In general, well nourished, with a masked facies.  Mental status:   Did not check today.  Cranial Nerves: Pupils are equally round and reactive to light. Visual fields full to confrontation. EOMs have some slight slowing of saccades, but  are full. Facial sensation and muscles of mastication are intact. Muscles of  facial expression left lower facial wekaness and sykinesis. Hearing intact to bilateral finger rub. Tongue protrusion, uvula, palate midline.  Shoulder shrug intact  Motor:  Mild increase in axial tone. Appendicular tone normal. Bradykinesia of FFM and heal tapping bilaterally. . Reflexes: Brisk throughout, toes up on right, down on left.  Gait: Stooped gait and station, turns slowly. Decreased arm swing. No tremor. mild retropulsion    Impression/Recommendations:  1.  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  I am going to have her titrate off her Sinemet as it has not seemed to help her.  I then would then add a cholinergic agent for her memory to see if this helps.  We will see the patient back in 3 months.  Lupita Raider Modesto Charon, MD [facility], [place]

## 2012-01-20 ENCOUNTER — Ambulatory Visit
Admission: RE | Admit: 2012-01-20 | Discharge: 2012-01-20 | Disposition: A | Discharge status: Home / Self Care | Payer: 59 | Source: Ambulatory Visit | Attending: Family Medicine | Admitting: Family Medicine

## 2012-01-20 DIAGNOSIS — Z1231 Encounter for screening mammogram for malignant neoplasm of breast: Secondary | ICD-10-CM

## 2012-01-21 ENCOUNTER — Ambulatory Visit (HOSPITAL_COMMUNITY): Payer: 59 | Admitting: Psychiatry

## 2012-01-21 ENCOUNTER — Encounter: Payer: Self-pay | Admitting: Family Medicine

## 2012-01-21 ENCOUNTER — Encounter: Payer: Self-pay | Admitting: *Deleted

## 2012-01-21 LAB — HM MAMMOGRAPHY: HM Mammogram: NORMAL

## 2012-01-25 ENCOUNTER — Telehealth: Payer: Self-pay | Admitting: Neurology

## 2012-01-25 NOTE — Telephone Encounter (Signed)
Pt has started weaning off of Sinemet per Dr. Nash Dimmer instruction. Decrease started last Friday. Pt started falling immediately, at least twice a day. She had a really bad fall this morning. She hit her head, but daughter denies any serious injury. Daughter would like to know if there is anything to be done or if her mother should stay on the medication. Please return daughter's call at work number.

## 2012-01-25 NOTE — Telephone Encounter (Signed)
Called and spoke with [name], patient's daughter. She states since weaning off the Sinemet she gotten worse and is falling daily, sometimes more than once. So far she has not injured herself. Even thought the medication didn't seem to help her, she is much worse since weaning it; tremor is worse as well. I told her that I would let Dr. Modesto Charon know the situation and get back with her with any additional information. [name] is ok with this plan. **Dr. Modesto Charon, please advise. Thanks.

## 2012-01-26 NOTE — Telephone Encounter (Signed)
Spoke with [name]. Information given as per Dr. Modesto Charon below. She states she will increase the Sinemet. Advised to call us if additional problems. She states she will.

## 2012-01-26 NOTE — Telephone Encounter (Signed)
have her go back to her original dose of the sinemet.

## 2012-01-26 NOTE — Telephone Encounter (Signed)
Left a message for [name] to return my call.

## 2012-01-30 ENCOUNTER — Encounter (HOSPITAL_COMMUNITY): Payer: Self-pay | Admitting: Emergency Medicine

## 2012-01-30 DIAGNOSIS — E119 Type 2 diabetes mellitus without complications: Secondary | ICD-10-CM | POA: Insufficient documentation

## 2012-01-30 DIAGNOSIS — Y92009 Unspecified place in unspecified non-institutional (private) residence as the place of occurrence of the external cause: Secondary | ICD-10-CM | POA: Insufficient documentation

## 2012-01-30 DIAGNOSIS — Z79899 Other long term (current) drug therapy: Secondary | ICD-10-CM | POA: Insufficient documentation

## 2012-01-30 DIAGNOSIS — W19XXXA Unspecified fall, initial encounter: Secondary | ICD-10-CM | POA: Insufficient documentation

## 2012-01-30 DIAGNOSIS — H05239 Hemorrhage of unspecified orbit: Secondary | ICD-10-CM | POA: Insufficient documentation

## 2012-01-30 DIAGNOSIS — R42 Dizziness and giddiness: Secondary | ICD-10-CM | POA: Insufficient documentation

## 2012-01-30 DIAGNOSIS — E785 Hyperlipidemia, unspecified: Secondary | ICD-10-CM | POA: Insufficient documentation

## 2012-01-30 DIAGNOSIS — Z86718 Personal history of other venous thrombosis and embolism: Secondary | ICD-10-CM | POA: Insufficient documentation

## 2012-01-30 DIAGNOSIS — Z7982 Long term (current) use of aspirin: Secondary | ICD-10-CM | POA: Insufficient documentation

## 2012-01-30 DIAGNOSIS — I10 Essential (primary) hypertension: Secondary | ICD-10-CM | POA: Insufficient documentation

## 2012-01-30 DIAGNOSIS — F0390 Unspecified dementia without behavioral disturbance: Secondary | ICD-10-CM | POA: Insufficient documentation

## 2012-01-30 DIAGNOSIS — R079 Chest pain, unspecified: Secondary | ICD-10-CM | POA: Insufficient documentation

## 2012-01-30 LAB — CBC WITH DIFFERENTIAL/PLATELET
Basophils Absolute: 0 10*3/uL (ref 0.0–0.1)
Basophils Relative: 0 % (ref 0–1)
Eosinophils Absolute: 0.1 10*3/uL (ref 0.0–0.7)
Eosinophils Relative: 1 % (ref 0–5)
HCT: 40.1 % (ref 36.0–46.0)
Hemoglobin: 13.8 g/dL (ref 12.0–15.0)
Lymphocytes Relative: 11 % — ABNORMAL LOW (ref 12–46)
Lymphs Abs: 1.1 10*3/uL (ref 0.7–4.0)
MCH: 31.7 pg (ref 26.0–34.0)
MCHC: 34.4 g/dL (ref 30.0–36.0)
MCV: 92.2 fL (ref 78.0–100.0)
Monocytes Absolute: 0.5 10*3/uL (ref 0.1–1.0)
Monocytes Relative: 5 % (ref 3–12)
Neutro Abs: 8 10*3/uL — ABNORMAL HIGH (ref 1.7–7.7)
Neutrophils Relative %: 83 % — ABNORMAL HIGH (ref 43–77)
Platelets: 248 10*3/uL (ref 150–400)
RBC: 4.35 MIL/uL (ref 3.87–5.11)
RDW: 12.6 % (ref 11.5–15.5)
WBC: 9.7 10*3/uL (ref 4.0–10.5)

## 2012-01-30 LAB — URINE MICROSCOPIC-ADD ON

## 2012-01-30 LAB — URINALYSIS, ROUTINE W REFLEX MICROSCOPIC
Bilirubin Urine: NEGATIVE
Glucose, UA: NEGATIVE mg/dL
Hgb urine dipstick: NEGATIVE
Ketones, ur: 15 mg/dL — AB
Nitrite: NEGATIVE
Protein, ur: NEGATIVE mg/dL
Specific Gravity, Urine: 1.021 (ref 1.005–1.030)
Urobilinogen, UA: 0.2 mg/dL (ref 0.0–1.0)
pH: 7 (ref 5.0–8.0)

## 2012-01-30 LAB — COMPREHENSIVE METABOLIC PANEL
ALT: <5 U/L (ref 0–35)
AST: 16 U/L (ref 0–37)
Albumin: 3.9 g/dL (ref 3.5–5.2)
Alkaline Phosphatase: 59 U/L (ref 39–117)
BUN: 21 mg/dL (ref 6–23)
CO2: 28 mEq/L (ref 19–32)
Calcium: 10.5 mg/dL (ref 8.4–10.5)
Chloride: 97 mEq/L (ref 96–112)
Creatinine, Ser: 0.37 mg/dL — ABNORMAL LOW (ref 0.50–1.10)
GFR calc Af Amer: >90 mL/min (ref >90)
GFR calc non Af Amer: >90 mL/min (ref >90)
Glucose, Bld: 129 mg/dL — ABNORMAL HIGH (ref 70–99)
Potassium: 4 mEq/L (ref 3.5–5.1)
Sodium: 136 mEq/L (ref 135–145)
Total Bilirubin: 0.5 mg/dL (ref 0.3–1.2)
Total Protein: 6.8 g/dL (ref 6.0–8.3)

## 2012-01-30 NOTE — ED Notes (Addendum)
Patient complaining of two falls tonight; patient reports intermittent dizziness for the past few days.  Patient also reports history of Parkinson's.  Patient states that she hit her left side of face and left shoulder on her bed with the first fall.  Patient has bruising to right side of face from second fall tonight (patient hit face on washing machine), which occurred an hour after the first fall.  Hand grips and foot pushes are bilaterally equal and strong.  Left sided facial droop; present reports bells palsy.  Denies chest pain and shortness of breath.

## 2012-01-31 ENCOUNTER — Emergency Department (HOSPITAL_COMMUNITY): Payer: 59

## 2012-01-31 ENCOUNTER — Emergency Department (HOSPITAL_COMMUNITY)
Admission: EM | Admit: 2012-01-31 | Discharge: 2012-01-31 | Disposition: A | Discharge status: Home / Self Care | Payer: 59 | Attending: Emergency Medicine | Admitting: Emergency Medicine

## 2012-01-31 DIAGNOSIS — H05239 Hemorrhage of unspecified orbit: Secondary | ICD-10-CM

## 2012-01-31 DIAGNOSIS — W19XXXA Unspecified fall, initial encounter: Secondary | ICD-10-CM

## 2012-01-31 DIAGNOSIS — R42 Dizziness and giddiness: Secondary | ICD-10-CM

## 2012-01-31 MED ORDER — FLUORESCEIN SODIUM 1 MG OP STRP
1.0000 | ORAL_STRIP | Freq: Once | OPHTHALMIC | Status: AC
  Administered 2012-01-31: 1 via OPHTHALMIC
  Filled 2012-01-31: qty 1

## 2012-01-31 MED ORDER — TETRACAINE HCL 0.5 % OP SOLN
1.0000 [drp] | Freq: Once | OPHTHALMIC | Status: AC
  Administered 2012-01-31: 2 [drp] via OPHTHALMIC
  Filled 2012-01-31: qty 2

## 2012-01-31 MED ORDER — REFRESH P.M. OP OINT: TOPICAL_OINTMENT | OPHTHALMIC | Status: DC | PRN

## 2012-01-31 MED ORDER — ARTIFICIAL TEARS OP OINT
TOPICAL_OINTMENT | Freq: Once | OPHTHALMIC | Status: AC
  Administered 2012-01-31: 06:00:00 via OPHTHALMIC
  Filled 2012-01-31 (×2): qty 3.5

## 2012-01-31 NOTE — ED Notes (Signed)
Patient transported to CT/Xray. 

## 2012-01-31 NOTE — ED Notes (Signed)
MD at bedside. 

## 2012-01-31 NOTE — ED Notes (Signed)
Family at bedside. 

## 2012-01-31 NOTE — ED Notes (Signed)
Patient is AOx4 and comfortable with her discharge instructions.  Her daughter is driving her home.

## 2012-02-01 LAB — URINE CULTURE
Colony Count: NO GROWTH
Culture: NO GROWTH

## 2012-02-01 NOTE — ED Provider Notes (Cosign Needed)
History     CSN: [id number]  Arrival date & time 01/30/12  2216   First MD Initiated Contact with Patient 01/31/12 0242      Chief Complaint  Patient presents with  . Fall  . Dizziness    (Consider location/radiation/quality/duration/timing/severity/associated sxs/prior treatment) HPI 66 yo female presents to the ER with complaint of fall and dizziness.  Pt with h/o balance dysfunction, intiially thought to be parkinson's, but is awaiting further neuro workup.  Pt reports when she bends over, she is unable to keep her balance.  Pt has frequent falls, two today.  First fall when she was making her bed and leaned over, hitting the four poster edge.  Second fall while taking clothes out of the dryer.  Pt lives with her daughters, one is accompanying her today.  No LOC.  Pt with periorbital hematoma.   Past Medical History  Diagnosis Date  . Dysmetabolic syndrome X   . Personal history of thrombophlebitis   . Essential hypertension, benign   . Morbid obesity   . Mixed hyperlipidemia   . Nonspecific abnormal electrocardiogram (ECG) (EKG)   . Special screening for malignant neoplasms of other sites   . Depressive disorder, not elsewhere classified   . Bell's palsy   . Need for prophylactic hormone replacement therapy (postmenopausal)   . Adjustment disorder with depressed mood   . Need for prophylactic vaccination and inoculation against influenza   . DVT (deep venous thrombosis), left "early 2000's"    LLE  . Syncope and collapse 12/06/11    "this am; I pass out fairly often"  . Type II or unspecified type diabetes mellitus without mention of complication, not stated as uncontrolled 12/06/11    "not anymore; had lap band OR"  . Neurodegenerative gait disorder   . Headache 05/2011    "pretty often since they put me on Parkinson's medicine"  . Parkinson disease     "they think that's what it is"  . DEMENTIA   . 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  Anxiety   . History of shingles     "as a teen and in my 66's"    Past Surgical History  Procedure Date  . Tonsillectomy and adenoidectomy 1970  . Bladder tack 11/1995  . Septoplasty 08/1998    with antral window   . Knee arthroscopy 12/12/2002    right   . Fracture surgery 02/09/2006    bilateral elbows  . Fracture surgery     right knee  . Fracture surgery     tib plateau  . Breast cyst aspiration 12/1998    bilaterally  . Breast biopsy 05/20/2000    bilaterally  . Tubal ligation 1970's  . Cataract extraction w/ intraocular lens  implant, bilateral   . Laparoscopic gastric banding 2008  . Cardiac catheterization 2004    Family History  Problem Relation Age of Onset  . COPD Father   . Diabetes Sister   . Vision loss Sister     legally blind  . Asthma Mother   . Heart disease Father     History  Substance Use Topics  . Smoking status: Never Smoker   . Smokeless tobacco: Never Used  . Alcohol Use: No    OB History    Grav Para Term Preterm Abortions TAB SAB Ect Mult Living                  Review  of Systems  All other systems reviewed and are negative.    Allergies  Codeine sulfate and Penicillins  Home Medications   Current Outpatient Rx  Name Route Sig Dispense Refill  . ALPRAZOLAM 0.5 MG PO TABS Oral Take 0.5 mg by mouth 2 (two) times daily.    . ASPIRIN 81 MG PO TABS Oral Take 81 mg by mouth daily.     Marland Kitchen GRAPE SEED PO Oral Take 1 tablet by mouth daily.    Marland Kitchen CARBIDOPA-LEVODOPA 25-100 MG PO TABS Oral Take 3 tablets by mouth 3 (three) times daily.    Marland Kitchen VITAMIN D3 1000 UNITS PO CAPS Oral Take 1 capsule by mouth daily.      Marland Kitchen FLUOXETINE HCL 20 MG PO TABS Oral Take 40 mg by mouth daily.     Marland Kitchen LOSARTAN POTASSIUM-HCTZ 50-12.5 MG PO TABS Oral Take 1 tablet by mouth daily.    . CENTRUM SILVER PO Oral Take 1 tablet by mouth daily.      Marland Kitchen SIMVASTATIN 80 MG PO TABS Oral Take 1 tablet (80 mg total) by mouth at bedtime. 90 tablet 3  . VITAMIN B-12 1000 MCG  PO TABS Oral Take 1,000 mcg by mouth daily.      Marland Kitchen REFRESH P.M. OP OINT Both Eyes Place into both eyes as needed. For dry or irritated eyes 3.5 g 12    BP 134/81  Pulse 61  Temp 98.1 F (36.7 C) (Oral)  Resp 15  SpO2 100%  Physical Exam  Nursing note and vitals reviewed. Constitutional: She is oriented to person, place, and time. She appears well-developed and well-nourished.       Appears older than stated age  HENT:  Head: Normocephalic and atraumatic.  Right Ear: External ear normal.  Left Ear: External ear normal.  Nose: Nose normal.  Mouth/Throat: Oropharynx is clear and moist. No oropharyngeal exudate.       Periorbital hematoma to right eye  Eyes: Conjunctivae and EOM are normal. Pupils are equal, round, and reactive to light.  Neck: Normal range of motion. Neck supple. No JVD present. No tracheal deviation present. No thyromegaly present.  Cardiovascular: Normal rate, regular rhythm, normal heart sounds and intact distal pulses.  Exam reveals no gallop and no friction rub.   No murmur heard. Pulmonary/Chest: Effort normal and breath sounds normal. No stridor. No respiratory distress. She has no wheezes. She has no rales. She exhibits no tenderness.  Abdominal: Soft. Bowel sounds are normal. She exhibits no distension and no mass. There is no tenderness. There is no rebound and no guarding.  Musculoskeletal: Normal range of motion. She exhibits no edema and no tenderness.  Lymphadenopathy:    She has no cervical adenopathy.  Neurological: She is oriented to person, place, and time. She has normal reflexes. No cranial nerve deficit. She exhibits normal muscle tone. Coordination normal.  Skin: Skin is dry. No rash noted. No erythema. No pallor.  Psychiatric: She has a normal mood and affect. Her behavior is normal. Judgment and thought content normal.    ED Course  Procedures (including critical care time)  Labs Reviewed  CBC WITH DIFFERENTIAL - Abnormal; Notable for the  following:    Neutrophils Relative 83 (*)     Neutro Abs 8.0 (*)     Lymphocytes Relative 11 (*)     All other components within normal limits  COMPREHENSIVE METABOLIC PANEL - Abnormal; Notable for the following:    Glucose, Bld 129 (*)  Creatinine, Ser 0.37 (*)     All other components within normal limits  URINALYSIS, ROUTINE W REFLEX MICROSCOPIC - Abnormal; Notable for the following:    Color, Urine AMBER (*)  BIOCHEMICALS MAY BE AFFECTED BY COLOR   APPearance CLOUDY (*)     Ketones, ur 15 (*)     Leukocytes, UA MODERATE (*)     All other components within normal limits  URINE MICROSCOPIC-ADD ON - Abnormal; Notable for the following:    Squamous Epithelial / LPF FEW (*)     Bacteria, UA FEW (*)     Casts HYALINE CASTS (*)     All other components within normal limits  URINE CULTURE   Dg Chest 2 View  01/31/2012  *RADIOLOGY REPORT*  Clinical Data: Status post fall; left chest pain.  CHEST - 2 VIEW  Comparison: Chest radiograph and CTA of the chest performed 09/08/2009  Findings: The lungs are mildly hypoexpanded but appear grossly clear.  There is no evidence of focal opacification, pleural effusion or pneumothorax.  The heart is normal in size; the mediastinal contour is within normal limits.  No acute osseous abnormalities are seen. A gastric band is noted; the phi angle measures 34 degrees, within normal limits.  IMPRESSION: Lungs mildly hypoexpanded but grossly clear.  No displaced rib fractures seen.  Original Report Authenticated By: Tonia Ghent, M.D.   Dg Sacrum/coccyx  01/31/2012  *RADIOLOGY REPORT*  Clinical Data: Status post fall; sacral pain.  SACRUM AND COCCYX - 2+ VIEW  Comparison: CT of the abdomen and pelvis performed 09/08/2005  Findings: There is no definite evidence of fracture or dislocation, though the frontal views are suboptimal due to overlying bowel gas. The sacrum and coccyx appear grossly intact on the lateral view. Mild facet disease is noted at the lower  lumbar spine.  Mild sclerotic change is seen at the sacroiliac joints.  No significant soft tissue abnormalities are characterized on radiograph.  IMPRESSION: No evidence of fracture or dislocation.  Original Report Authenticated By: Tonia Ghent, M.D.   Ct Head Wo Contrast  01/31/2012  *RADIOLOGY REPORT*  Clinical Data:  Two falls today; history of Bell's palsy.  Right- sided facial and periorbital bruising.  Concern for cervical spine injury.  CT HEAD WITHOUT CONTRAST CT MAXILLOFACIAL WITHOUT CONTRAST CT CERVICAL SPINE WITHOUT CONTRAST  Technique:  Multidetector CT imaging of the head, cervical spine, and maxillofacial structures were performed using the standard protocol without intravenous contrast. Multiplanar CT image reconstructions of the cervical spine and maxillofacial structures were also generated.  Comparison:  CT of the head performed 12/06/2011, MRI of the brain performed 04/23/2011, MRI of the cervical spine performed 09/08/2009, and CT of the cervical spine performed 09/08/2009  CT HEAD  Findings: There is no evidence of acute infarction, mass lesion, or intra- or extra-axial hemorrhage on CT.  Prominence of the ventricles and sulci reflects moderate cortical volume loss.  Scattered periventricular and subcortical white matter change likely reflects small vessel ischemic microangiopathy.  Mild cerebellar atrophy is noted.  The brainstem and fourth ventricle are within normal limits.  The basal ganglia are unremarkable in appearance.  The cerebral hemispheres demonstrate grossly normal gray-white differentiation. No mass effect or midline shift is seen.  There is no evidence of fracture; visualized osseous structures are unremarkable in appearance.  The orbits are within normal limits. The paranasal sinuses and mastoid air cells are well-aerated; the patient is status post bilateral maxillary antrostomy.  Mild soft tissue swelling is noted about the  right orbit and overlying the right zygomatic  arch.  IMPRESSION:  1.  No evidence of traumatic intracranial injury or fracture. 2.  Mild soft tissue swelling about the right orbit and overlying the right zygomatic arch. 3.  Moderate cortical volume loss and scattered small vessel ischemic microangiopathy.  CT MAXILLOFACIAL  Findings:  There is no evidence of fracture or dislocation.  The maxilla and mandible appear intact.  The nasal bone is unremarkable in appearance.  The visualized dentition demonstrates no acute abnormality.  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  The orbits are intact bilaterally.  The paranasal sinuses and mastoid air cells are well-aerated; the patient is status post bilateral maxillary antrostomy.  Mild soft tissue swelling is noted about the right orbit and overlying the right zygomatic arch.  The parapharyngeal fat planes are preserved.  The nasopharynx, oropharynx and hypopharynx are unremarkable in appearance.  The visualized portions of the valleculae and piriform sinuses are grossly unremarkable.  The parotid and submandibular glands are within normal limits.  No cervical lymphadenopathy is seen.  IMPRESSION:  1.  No evidence of fracture or dislocation. 2.  Mild soft tissue swelling about the right orbit and overlying the right zygomatic arch.  CT CERVICAL SPINE  Findings:   There is no evidence of fracture or subluxation. Vertebral bodies demonstrate normal height and alignment.  There is slight narrowing of the vertebral disc space at C4-C5, with an associated posterior disc osteophyte complex.  Prevertebral soft tissues are within normal limits.  The thyroid gland is unremarkable in appearance.  The visualized lung apices are clear.  Calcification is noted at the carotid bifurcations bilaterally, more prominent on the left.  IMPRESSION:  1.  No evidence of fracture or subluxation along the cervical spine. 2.  Mild degenerative change at the mid cervical spine. 3.   Calcification at the carotid bifurcations bilaterally, more prominent on the left.  Original Report Authenticated By: Tonia Ghent, M.D.   Ct Cervical Spine Wo Contrast  01/31/2012  *RADIOLOGY REPORT*  Clinical Data:  Two falls today; history of Bell's palsy.  Right- sided facial and periorbital bruising.  Concern for cervical spine injury.  CT HEAD WITHOUT CONTRAST CT MAXILLOFACIAL WITHOUT CONTRAST CT CERVICAL SPINE WITHOUT CONTRAST  Technique:  Multidetector CT imaging of the head, cervical spine, and maxillofacial structures were performed using the standard protocol without intravenous contrast. Multiplanar CT image reconstructions of the cervical spine and maxillofacial structures were also generated.  Comparison:  CT of the head performed 12/06/2011, MRI of the brain performed 04/23/2011, MRI of the cervical spine performed 09/08/2009, and CT of the cervical spine performed 09/08/2009  CT HEAD  Findings: There is no evidence of acute infarction, mass lesion, or intra- or extra-axial hemorrhage on CT.  Prominence of the ventricles and sulci reflects moderate cortical volume loss.  Scattered periventricular and subcortical white matter change likely reflects small vessel ischemic microangiopathy.  Mild cerebellar atrophy is noted.  The brainstem and fourth ventricle are within normal limits.  The basal ganglia are unremarkable in appearance.  The cerebral hemispheres demonstrate grossly normal gray-white differentiation. No mass effect or midline shift is seen.  There is no evidence of fracture; visualized osseous structures are unremarkable in appearance.  The orbits are within normal limits. The paranasal sinuses and mastoid air cells are well-aerated; the patient is status post bilateral maxillary antrostomy.  Mild soft tissue swelling is noted about the right orbit and overlying the  right zygomatic arch.  IMPRESSION:  1.  No evidence of traumatic intracranial injury or fracture. 2.  Mild soft tissue  swelling about the right orbit and overlying the right zygomatic arch. 3.  Moderate cortical volume loss and scattered small vessel ischemic microangiopathy.  CT MAXILLOFACIAL  Findings:  There is no evidence of fracture or dislocation.  The maxilla and mandible appear intact.  The nasal bone is unremarkable in appearance.  The visualized dentition demonstrates no acute abnormality.  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  The orbits are intact bilaterally.  The paranasal sinuses and mastoid air cells are well-aerated; the patient is status post bilateral maxillary antrostomy.  Mild soft tissue swelling is noted about the right orbit and overlying the right zygomatic arch.  The parapharyngeal fat planes are preserved.  The nasopharynx, oropharynx and hypopharynx are unremarkable in appearance.  The visualized portions of the valleculae and piriform sinuses are grossly unremarkable.  The parotid and submandibular glands are within normal limits.  No cervical lymphadenopathy is seen.  IMPRESSION:  1.  No evidence of fracture or dislocation. 2.  Mild soft tissue swelling about the right orbit and overlying the right zygomatic arch.  CT CERVICAL SPINE  Findings:   There is no evidence of fracture or subluxation. Vertebral bodies demonstrate normal height and alignment.  There is slight narrowing of the vertebral disc space at C4-C5, with an associated posterior disc osteophyte complex.  Prevertebral soft tissues are within normal limits.  The thyroid gland is unremarkable in appearance.  The visualized lung apices are clear.  Calcification is noted at the carotid bifurcations bilaterally, more prominent on the left.  IMPRESSION:  1.  No evidence of fracture or subluxation along the cervical spine. 2.  Mild degenerative change at the mid cervical spine. 3.  Calcification at the carotid bifurcations bilaterally, more prominent on the left.  Original Report  Authenticated By: Tonia Ghent, M.D.   Ct Maxillofacial Wo Cm  01/31/2012  *RADIOLOGY REPORT*  Clinical Data:  Two falls today; history of Bell's palsy.  Right- sided facial and periorbital bruising.  Concern for cervical spine injury.  CT HEAD WITHOUT CONTRAST CT MAXILLOFACIAL WITHOUT CONTRAST CT CERVICAL SPINE WITHOUT CONTRAST  Technique:  Multidetector CT imaging of the head, cervical spine, and maxillofacial structures were performed using the standard protocol without intravenous contrast. Multiplanar CT image reconstructions of the cervical spine and maxillofacial structures were also generated.  Comparison:  CT of the head performed 12/06/2011, MRI of the brain performed 04/23/2011, MRI of the cervical spine performed 09/08/2009, and CT of the cervical spine performed 09/08/2009  CT HEAD  Findings: There is no evidence of acute infarction, mass lesion, or intra- or extra-axial hemorrhage on CT.  Prominence of the ventricles and sulci reflects moderate cortical volume loss.  Scattered periventricular and subcortical white matter change likely reflects small vessel ischemic microangiopathy.  Mild cerebellar atrophy is noted.  The brainstem and fourth ventricle are within normal limits.  The basal ganglia are unremarkable in appearance.  The cerebral hemispheres demonstrate grossly normal gray-white differentiation. No mass effect or midline shift is seen.  There is no evidence of fracture; visualized osseous structures are unremarkable in appearance.  The orbits are within normal limits. The paranasal sinuses and mastoid air cells are well-aerated; the patient is status post bilateral maxillary antrostomy.  Mild soft tissue swelling is noted about the right orbit and overlying the right zygomatic arch.  IMPRESSION:  1.  No evidence of traumatic intracranial injury or fracture. 2.  Mild soft tissue swelling about the right orbit and overlying the right zygomatic arch. 3.  Moderate cortical volume loss and  scattered small vessel ischemic microangiopathy.  CT MAXILLOFACIAL  Findings:  There is no evidence of fracture or dislocation.  The maxilla and mandible appear intact.  The nasal bone is unremarkable in appearance.  The visualized dentition demonstrates no acute abnormality.  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  The orbits are intact bilaterally.  The paranasal sinuses and mastoid air cells are well-aerated; the patient is status post bilateral maxillary antrostomy.  Mild soft tissue swelling is noted about the right orbit and overlying the right zygomatic arch.  The parapharyngeal fat planes are preserved.  The nasopharynx, oropharynx and hypopharynx are unremarkable in appearance.  The visualized portions of the valleculae and piriform sinuses are grossly unremarkable.  The parotid and submandibular glands are within normal limits.  No cervical lymphadenopathy is seen.  IMPRESSION:  1.  No evidence of fracture or dislocation. 2.  Mild soft tissue swelling about the right orbit and overlying the right zygomatic arch.  CT CERVICAL SPINE  Findings:   There is no evidence of fracture or subluxation. Vertebral bodies demonstrate normal height and alignment.  There is slight narrowing of the vertebral disc space at C4-C5, with an associated posterior disc osteophyte complex.  Prevertebral soft tissues are within normal limits.  The thyroid gland is unremarkable in appearance.  The visualized lung apices are clear.  Calcification is noted at the carotid bifurcations bilaterally, more prominent on the left.  IMPRESSION:  1.  No evidence of fracture or subluxation along the cervical spine. 2.  Mild degenerative change at the mid cervical spine. 3.  Calcification at the carotid bifurcations bilaterally, more prominent on the left.  Original Report Authenticated By: Tonia Ghent, M.D.     1. Fall   2. Periorbital hematoma   3. Dizziness       MDM  66 yo  female with frequent falls, now with periorbital hematoma.  Pt having workup through her neurologist.  Had discussion with pt and daughter regarding set up of house and avoiding positions of dizziness.  Urine sent for culture.        Olivia Mackie, MD 02/01/12 [phone]

## 2012-02-07 ENCOUNTER — Other Ambulatory Visit: Payer: Self-pay | Admitting: Family Medicine

## 2012-02-07 ENCOUNTER — Encounter: Payer: Self-pay | Admitting: Family Medicine

## 2012-02-07 ENCOUNTER — Ambulatory Visit (INDEPENDENT_AMBULATORY_CARE_PROVIDER_SITE_OTHER): Payer: 59 | Admitting: Family Medicine

## 2012-02-07 ENCOUNTER — Ambulatory Visit: Payer: 59 | Admitting: Family Medicine

## 2012-02-07 VITALS — BP 110/80 | HR 88 | Temp 97.5°F | Wt 149.0 lb

## 2012-02-07 DIAGNOSIS — R300 Dysuria: Secondary | ICD-10-CM

## 2012-02-07 LAB — POCT URINALYSIS DIPSTICK
Bilirubin, UA: NEGATIVE
Glucose, UA: NEGATIVE
Ketones, UA: NEGATIVE
Nitrite, UA: NEGATIVE
Spec Grav, UA: 1.015
Urobilinogen, UA: NEGATIVE
pH, UA: 6

## 2012-02-07 MED ORDER — CIPROFLOXACIN HCL 250 MG PO TABS: 250.0000 mg | ORAL_TABLET | Freq: Two times a day (BID) | ORAL | Status: DC

## 2012-02-07 MED ORDER — CIPROFLOXACIN HCL 250 MG PO TABS: 250.0000 mg | ORAL_TABLET | Freq: Two times a day (BID) | ORAL | Status: AC

## 2012-02-07 NOTE — Progress Notes (Signed)
Subjective:    Patient ID: [name], female    DOB: [birth year], 66 y.o.   MRN: [id number]  HPI  66 yo here for ?UTI.  Daughter wanted her to get checked out- has had increased falls lately.  Unclear if due to UTI or her neurodegenerative disease. H/o UTI without urinary symptoms.  Denies dysuria, back pain or suprapubic tenderness. No fever, chills, nausea or vomiting.   UA positive Mod LE and protein, otherwise unremarkable.  Patient Active Problem List  Diagnosis  . AODM  . HYPERLIPIDEMIA, MIXED  . Dysmetabolic syndrome X  . OBESITY, MORBID  . ANXIETY DEPRESSION  . GRIEF REACTION  . DISORDER, DEPRESSIVE NEC  . HYPERTENSION, BENIGN ESSENTIAL  . BREAST MASS, LEFT  . ARM PAIN, RIGHT  . OSTEOPENIA  . Dizziness and giddiness  . FATIGUE  . ALLERGY  . Hypersomnia  . Headache  . Loss of perception for taste  . Loss, sense of, smell  . UTI (lower urinary tract infection)  . Routine general medical examination at a health care facility  . Syncope  . Dysphagia  . Bell's palsy  . 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 Orthostasis  . Dysuria   Past Medical History  Diagnosis Date  . Dysmetabolic syndrome X   . Personal history of thrombophlebitis   . Essential hypertension, benign   . Morbid obesity   . Mixed hyperlipidemia   . Nonspecific abnormal electrocardiogram (ECG) (EKG)   . Special screening for malignant neoplasms of other sites   . Depressive disorder, not elsewhere classified   . Bell's palsy   . Need for prophylactic hormone replacement therapy (postmenopausal)   . Adjustment disorder with depressed mood   . Need for prophylactic vaccination and inoculation against influenza   . DVT (deep venous thrombosis), left "early 2000's"    LLE  . Syncope and collapse 12/06/11    "this am; I pass out fairly often"  . Type II or unspecified type diabetes mellitus without mention of complication, not stated as uncontrolled 12/06/11    "not anymore; had  lap band OR"  . Neurodegenerative gait disorder   . Headache 05/2011    "pretty often since they put me on Parkinson's medicine"  . Parkinson disease     "they think that's what it is"  . DEMENTIA   . 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 Anxiety   . History of shingles     "as a teen and in my 109's"   Past Surgical History  Procedure Date  . Tonsillectomy and adenoidectomy 1970  . Bladder tack 11/1995  . Septoplasty 08/1998    with antral window   . Knee arthroscopy 12/12/2002    right   . Fracture surgery 02/09/2006    bilateral elbows  . Fracture surgery     right knee  . Fracture surgery     tib plateau  . Breast cyst aspiration 12/1998    bilaterally  . Breast biopsy 05/20/2000    bilaterally  . Tubal ligation 1970's  . Cataract extraction w/ intraocular lens  implant, bilateral   . Laparoscopic gastric banding 2008  . Cardiac catheterization 2004   History  Substance Use Topics  . Smoking status: Never Smoker   . Smokeless tobacco: Never Used  . Alcohol Use: No   Family History  Problem Relation Age of Onset  . COPD Father   . Diabetes Sister   .  Vision loss Sister     legally blind  . Asthma Mother   . Heart disease Father    Allergies  Allergen Reactions  . Codeine Sulfate     REACTION: hallucinations  . Penicillins Anaphylaxis, Swelling and Rash   Current Outpatient Prescriptions on File Prior to Visit  Medication Sig Dispense Refill  . ALPRAZolam (XANAX) 0.5 MG tablet Take 0.5 mg by mouth 2 (two) times daily.      . Artificial Tear Ointment (ARTIFICIAL TEARS) ointment Place into both eyes as needed. For dry or irritated eyes  3.5 g  12  . aspirin 81 MG tablet Take 81 mg by mouth daily.       Marland Kitchen Bioflavonoid Products (GRAPE SEED PO) Take 1 tablet by mouth daily.      . carbidopa-levodopa (SINEMET IR) 25-100 MG per tablet Take 3 tablets by mouth 3 (three) times daily.      . Cholecalciferol (VITAMIN D3) 1000 UNITS  CAPS Take 1 capsule by mouth daily.        Marland Kitchen FLUoxetine (PROZAC) 20 MG tablet Take 40 mg by mouth daily.       Marland Kitchen losartan-hydrochlorothiazide (HYZAAR) 50-12.5 MG per tablet Take 1 tablet by mouth daily.      . Multiple Vitamins-Minerals (CENTRUM SILVER PO) Take 1 tablet by mouth daily.        . simvastatin (ZOCOR) 80 MG tablet TAKE 1 TABLET AT BEDTIME  90 tablet  2  . vitamin B-12 (CYANOCOBALAMIN) 1000 MCG tablet Take 1,000 mcg by mouth daily.        Marland Kitchen DISCONTD: simvastatin (ZOCOR) 80 MG tablet Take 1 tablet (80 mg total) by mouth at bedtime.  90 tablet  3    The PMH, PSH, Social History, Family History, Medications, and allergies have been reviewed in [facility], and have been updated if relevant.     Objective:   Physical Exam BP 110/80  Pulse 88  Temp 97.5 F (36.4 C)  Wt 149 lb (67.586 kg)  General: Well-developed,well-nourished,in no acute distress; alert,appropriate and cooperative throughout examination, minimally overweight.  Old bruise under right eye Mouth: Oral mucosa and oropharynx without lesions or exudates. Teeth in good repair. Left facial paralysis unchanged.  Neck: No deformities, masses, or tenderness noted.  No bruits  Lungs: Normal respiratory effort, chest expands symmetrically. Lungs are clear to auscultation, no crackles or wheezes.  Heart: Normal rate and regular rhythm. S1 and S2 normal without gallop, murmur, click, rub or other extra sounds.  Abdomen: Bowel sounds positive,abdomen soft and non-tender without masses, organomegaly or hernias noted. NO CVA or suprapubic tenderness.  Extremities: No clubbing, cyanosis, edema, or deformity noted with normal full range of motion of all joints.  Neurologic: No cranial nerve deficits noted. Station and gait are normal. Sensory, motor and coordinative functions appear intact.  Psych: Cognition and judgment appear intact. Alert and cooperative with normal attention span and concentration. No apparent delusions, illusions,  hallucinations     Assessment & Plan:   1. Dysuria   New- given symptoms and pos LE on UA, will treat with cipro 250 mg twice daily x  10 days and send urine for cx. The patient indicates understanding of these issues and agrees with the plan.

## 2012-02-07 NOTE — Patient Instructions (Addendum)
Good to see you. You do have a UTI. Please take cipro (antibiotic) as directed- 1 tabet twice daily for 10 days. We will call you soon with your urine culture results.

## 2012-02-07 NOTE — Addendum Note (Signed)
Addended by: Eliezer Bottom on: 02/07/2012 03:02 PM   Modules accepted: Orders

## 2012-02-09 ENCOUNTER — Telehealth: Payer: Self-pay

## 2012-02-09 DIAGNOSIS — Z0279 Encounter for issue of other medical certificate: Secondary | ICD-10-CM

## 2012-02-09 LAB — URINE CULTURE: Colony Count: >=100000

## 2012-02-09 NOTE — Telephone Encounter (Signed)
[facility]; [facility] was filled [facility] pharmacy; [facility] C[facility] rx. Call [phone]

## 2012-02-09 NOTE — Telephone Encounter (Signed)
[facility], advised her to disregard script.  Not sure how it got to them, since [facility] was [facility].

## 2012-02-09 NOTE — Telephone Encounter (Signed)
[name] dose question for Cipro. [name] tried to put me on hold to get pharmacist on phone also. After several minutes [name] said having problems connecting and he would have to call back.

## 2012-03-05 ENCOUNTER — Other Ambulatory Visit: Payer: Self-pay | Admitting: Family Medicine

## 2012-03-13 ENCOUNTER — Encounter: Payer: Self-pay | Admitting: Neurology

## 2012-03-13 ENCOUNTER — Ambulatory Visit (INDEPENDENT_AMBULATORY_CARE_PROVIDER_SITE_OTHER): Payer: 59 | Admitting: Neurology

## 2012-03-13 VITALS — BP 126/84 | HR 88 | Wt 146.0 lb

## 2012-03-13 DIAGNOSIS — G20 Parkinson's disease: Secondary | ICD-10-CM

## 2012-03-13 DIAGNOSIS — G20A1 Parkinson's disease without dyskinesia, without mention of fluctuations: Secondary | ICD-10-CM

## 2012-03-13 NOTE — Progress Notes (Signed)
Dear Dr. Dayton Martes,  I saw  [name] back in [facility] for her problem with parkinsonism.  As you may recall, she is a 66 y.o. year old female with a history of left sided Bell's palsy, gastric lap band, who has had memory decline, progressive gait problems, with frequent falls without significant tremor.  MRI brain revealed global atrophy with mild to moderate white matter disease and mild chronic ischemic changes in the pons.  There was no putaminal rim hyperintensity, hot cross bun sign or hummingbird sign.  NP testing revealed dementia but without clear subtype.  She has denied problems with orthostasis, but does have problems with urinary urgency.  No clear signs of RBD.  Has only had hallucinations once.  I increased the patient's Sinemet to 100/25 IR tid.  She could not appreciate an improvement, but at her last visit I tried to wean it and she had worsening of her gait.  She falls 2-3 times per week now and is back on her old dose of Sinemet.  She doesn't endorse it wearing off and takes it a breakfast. lunch and dinner.  Since I saw her last she has taken some sick leave.  She is now sleeping more and says she is depressed.  She is due to see a therapist at the end of this month.  She finished Parkinson's out patient therapy.  She is using a cane most of the time and doesn't like to use her rolling walker.  She uses a wheelchair for long distances.  She says her tremor, though slight is getting worse.  She is getting confused more often about location ,etc.  Medical history, social history, and family history were reviewed and have not changed since the last clinic visit.  Current Outpatient Prescriptions on File Prior to Visit  Medication Sig Dispense Refill  . ALPRAZolam (XANAX) 0.5 MG tablet Take 0.5 mg by mouth 2 (two) times daily.      . Artificial Tear Ointment (ARTIFICIAL TEARS) ointment Place into both eyes as needed. For dry or irritated eyes  3.5 g  12  . aspirin  81 MG tablet Take 81 mg by mouth daily.       Marland Kitchen Bioflavonoid Products (GRAPE SEED PO) Take 1 tablet by mouth daily.      . Calcium-Vitamin D-Vitamin K (CALCIUM SOFT CHEWS PO) Take by mouth.      . carbidopa-levodopa (SINEMET IR) 25-100 MG per tablet Take 3 tablets by mouth 3 (three) times daily.      . Cholecalciferol (VITAMIN D3) 1000 UNITS CAPS Take 1 capsule by mouth daily.        Marland Kitchen FLUoxetine (PROZAC) 20 MG tablet TAKE 2 TABLETS DAILY  180 tablet  1  . losartan-hydrochlorothiazide (HYZAAR) 50-12.5 MG per tablet Take 1 tablet by mouth daily.      . Multiple Vitamins-Minerals (CENTRUM SILVER PO) Take 1 tablet by mouth daily.        . simvastatin (ZOCOR) 80 MG tablet TAKE 1 TABLET AT BEDTIME  90 tablet  2  . vitamin B-12 (CYANOCOBALAMIN) 1000 MCG tablet Take 1,000 mcg by mouth daily.          Allergies  Allergen Reactions  . Codeine Sulfate     REACTION: hallucinations  . Penicillins Anaphylaxis, Swelling and Rash    ROS:  13 systems were reviewed and are notable for depression as above.  All other review of systems are unremarkable.  Exam: . Filed Vitals:   03/13/12  1459  BP: 126/84  Pulse: 88  Weight: 146 lb (66.225 kg)    In general, tired appearing women, with masked facies, wide eyes.  Mental status:   Oriented to date, day, month, year, floor, street.  2/3 3 word recall.  3/5 WORLD backwards.  Cranial Nerves: Pupils are equally round and reactive to light. Visual fields full to confrontation. EOMs are full, but there is slowing of downward saccades. Facial sensation and muscles of mastication are intact. Muscles of facial expression are symmetric.  Tongue protrusion, uvula, palate midline.  Shoulder shrug intact  Motor:  Mild increase neck tone with appendicular augmentation.  Mild cogwheeling r>l arm, mild postural tremor.  Reflexes:  Brisk reflexes throughout, bilateral upgoing toes.  Coordination:  No dysmetria on F2N  Sensation:  vibration present at toes but  diminished.  temp normalizes mid shin, position slightly impaired.  Gait:  Relatively narrow based, shuffling, turns slowly, some retropulsion, worse than previous.  Impression/Recommendations:  1.  Parkinsonism - I am still uncertain as to what Parkinson's plus syndrome she has.  I am going to keep Sinemet at 100/25 3 pills tid.  I will let her follow up with Dr. Arbutus Leas for her opinion.  The addition of a cholinergic agent may help her memory, but I will hold on this for now.  She will follow up with Dr. Arbutus Leas in 3 months.  Lupita Raider Modesto Charon, MD [facility], [place]

## 2012-03-17 ENCOUNTER — Telehealth: Payer: Self-pay | Admitting: *Deleted

## 2012-03-17 NOTE — Telephone Encounter (Signed)
Pt needs more disability forms for [employer] completed. Daughter says these have to be done about every 30 days.  Forms are in your in box.

## 2012-03-17 NOTE — Telephone Encounter (Signed)
Daughter has decided to bring pt in for office visit to discuss these forms, appt scheduled for Monday.

## 2012-03-20 ENCOUNTER — Ambulatory Visit (INDEPENDENT_AMBULATORY_CARE_PROVIDER_SITE_OTHER): Payer: 59 | Admitting: Family Medicine

## 2012-03-20 ENCOUNTER — Encounter: Payer: Self-pay | Admitting: Family Medicine

## 2012-03-20 ENCOUNTER — Other Ambulatory Visit: Payer: Self-pay | Admitting: *Deleted

## 2012-03-20 ENCOUNTER — Ambulatory Visit: Payer: 59 | Admitting: Family Medicine

## 2012-03-20 VITALS — BP 100/70 | HR 68 | Temp 97.8°F | Wt 146.0 lb

## 2012-03-20 DIAGNOSIS — F068 Other specified mental disorders due to known physiological condition: Secondary | ICD-10-CM

## 2012-03-20 DIAGNOSIS — F0280 Dementia in other diseases classified elsewhere without behavioral disturbance: Secondary | ICD-10-CM

## 2012-03-20 DIAGNOSIS — F341 Dysthymic disorder: Secondary | ICD-10-CM

## 2012-03-20 MED ORDER — ALPRAZOLAM 0.5 MG PO TABS: 0.5000 mg | ORAL_TABLET | Freq: Two times a day (BID) | ORAL | Status: DC

## 2012-03-20 NOTE — Telephone Encounter (Signed)
[facility]

## 2012-03-20 NOTE — Telephone Encounter (Signed)
Pt needs refill on [facility].  Form is on your desk.

## 2012-03-20 NOTE — Progress Notes (Signed)
66 yo here with her daughter to fill out disability paper work.  Has been out of work since 02/08/2012 due to progressive decline.  She is being followed by  [facility] for her problem with parkinsonism.  Over past several months, she has had a gradual decline in her memory, progressive worsening of her gait and increased falls.   She last saw Dr. Modesto Charon on 03/13/2012- she is currently taking Simet 3 tabs tid and her dose may be increased in near future.  She also continues to struggle with depression. Prozac has helped but remains very depressed- she is "sad" about her current state of health and continues to miss her dead husband. She has finally agreed to pursue psychotherapy (she has appt scheduled).  Finished Parkinson's PT- pt feels it did not help much.  Current Outpatient Prescriptions on File Prior to Visit  Medication Sig Dispense Refill  . ALPRAZolam (XANAX) 0.5 MG tablet Take 0.5 mg by mouth 2 (two) times daily.      . Artificial Tear Ointment (ARTIFICIAL TEARS) ointment Place into both eyes as needed. For dry or irritated eyes  3.5 g  12  . aspirin 81 MG tablet Take 81 mg by mouth daily.       Marland Kitchen Bioflavonoid Products (GRAPE SEED PO) Take 1 tablet by mouth daily.      . Calcium-Vitamin D-Vitamin K (CALCIUM SOFT CHEWS PO) Take by mouth.      . carbidopa-levodopa (SINEMET IR) 25-100 MG per tablet Take 3 tablets by mouth 3 (three) times daily.      . Cholecalciferol (VITAMIN D3) 1000 UNITS CAPS Take 1 capsule by mouth daily.        Marland Kitchen FLUoxetine (PROZAC) 20 MG tablet TAKE 2 TABLETS DAILY  180 tablet  1  . losartan-hydrochlorothiazide (HYZAAR) 50-12.5 MG per tablet Take 1 tablet by mouth daily.      . Multiple Vitamins-Minerals (CENTRUM SILVER PO) Take 1 tablet by mouth daily.        . simvastatin (ZOCOR) 80 MG tablet TAKE 1 TABLET AT BEDTIME  90 tablet  2  . vitamin B-12 (CYANOCOBALAMIN) 1000 MCG tablet Take 1,000 mcg by mouth daily.          Allergies  Allergen  Reactions  . Codeine Sulfate     REACTION: hallucinations  . Penicillins Anaphylaxis, Swelling and Rash    ROS:  13 systems were reviewed and are notable for depression as above.  All other review of systems are unremarkable.  Exam: . Filed Vitals:   03/20/12 1157  BP: 100/70  Pulse: 68  Temp: 97.8 F (36.6 C)  Weight: 146 lb (66.225 kg)    General: Well-developed,well-nourished,in no acute distress; alert,appropriate and cooperative throughout examination, minimally overweight.  Mouth: Oral mucosa and oropharynx without lesions or exudates. Teeth in good repair. Left facial paralysis unchanged.  Neck: No deformities, masses, or tenderness noted.  No bruits  Lungs: Normal respiratory effort, chest expands symmetrically. Lungs are clear to auscultation, no crackles or wheezes.  Heart: Normal rate and regular rhythm. S1 and S2 normal without gallop, murmur, click, rub or other extra sounds.  Abdomen: Bowel sounds positive,abdomen soft and non-tender without masses, organomegaly or hernias noted. NO CVA or suprapubic tenderness.  Extremities: No clubbing, cyanosis, edema, or deformity noted with normal full range of motion of all joints.  Neurologic: Shuffled gait, pos cogwheeling and tremor. Psych: Cognition and judgment appear intact. Alert and cooperative with normal attention span and concentration. No apparent delusions,  illusions, hallucinations  Assessment and Plan:  1. 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 Forms filled out- agree that pt should remain out of work pending further tx recommendations from neurology.    2. ANXIETY DEPRESSION  Deteriorated. Continue prozac.  I am pleased she has appt with psychiatry.

## 2012-03-23 ENCOUNTER — Emergency Department (HOSPITAL_COMMUNITY)
Admission: EM | Admit: 2012-03-23 | Discharge: 2012-03-23 | Disposition: A | Discharge status: Home / Self Care | Payer: 59 | Attending: Emergency Medicine | Admitting: Emergency Medicine

## 2012-03-23 ENCOUNTER — Emergency Department (HOSPITAL_COMMUNITY): Payer: 59

## 2012-03-23 ENCOUNTER — Encounter (HOSPITAL_COMMUNITY): Payer: Self-pay

## 2012-03-23 DIAGNOSIS — E785 Hyperlipidemia, unspecified: Secondary | ICD-10-CM | POA: Insufficient documentation

## 2012-03-23 DIAGNOSIS — G20A1 Parkinson's disease without dyskinesia, without mention of fluctuations: Secondary | ICD-10-CM | POA: Insufficient documentation

## 2012-03-23 DIAGNOSIS — F0390 Unspecified dementia without behavioral disturbance: Secondary | ICD-10-CM | POA: Insufficient documentation

## 2012-03-23 DIAGNOSIS — G20 Parkinson's disease: Secondary | ICD-10-CM | POA: Insufficient documentation

## 2012-03-23 DIAGNOSIS — Z86718 Personal history of other venous thrombosis and embolism: Secondary | ICD-10-CM | POA: Insufficient documentation

## 2012-03-23 DIAGNOSIS — R0602 Shortness of breath: Secondary | ICD-10-CM | POA: Insufficient documentation

## 2012-03-23 DIAGNOSIS — F411 Generalized anxiety disorder: Secondary | ICD-10-CM | POA: Insufficient documentation

## 2012-03-23 DIAGNOSIS — E119 Type 2 diabetes mellitus without complications: Secondary | ICD-10-CM | POA: Insufficient documentation

## 2012-03-23 LAB — CBC WITH DIFFERENTIAL/PLATELET
Basophils Absolute: 0 10*3/uL (ref 0.0–0.1)
Basophils Relative: 1 % (ref 0–1)
Eosinophils Absolute: 0.2 10*3/uL (ref 0.0–0.7)
Eosinophils Relative: 2 % (ref 0–5)
HCT: 39.8 % (ref 36.0–46.0)
Hemoglobin: 13.7 g/dL (ref 12.0–15.0)
Lymphocytes Relative: 21 % (ref 12–46)
Lymphs Abs: 1.9 10*3/uL (ref 0.7–4.0)
MCH: 31.8 pg (ref 26.0–34.0)
MCHC: 34.4 g/dL (ref 30.0–36.0)
MCV: 92.3 fL (ref 78.0–100.0)
Monocytes Absolute: 0.9 10*3/uL (ref 0.1–1.0)
Monocytes Relative: 10 % (ref 3–12)
Neutro Abs: 5.9 10*3/uL (ref 1.7–7.7)
Neutrophils Relative %: 67 % (ref 43–77)
Platelets: 249 10*3/uL (ref 150–400)
RBC: 4.31 MIL/uL (ref 3.87–5.11)
RDW: 12.7 % (ref 11.5–15.5)
WBC: 8.8 10*3/uL (ref 4.0–10.5)

## 2012-03-23 LAB — BASIC METABOLIC PANEL
BUN: 16 mg/dL (ref 6–23)
CO2: 28 mEq/L (ref 19–32)
Calcium: 10.2 mg/dL (ref 8.4–10.5)
Chloride: 99 mEq/L (ref 96–112)
Creatinine, Ser: 0.5 mg/dL (ref 0.50–1.10)
GFR calc Af Amer: >90 mL/min (ref >90)
GFR calc non Af Amer: >90 mL/min (ref >90)
Glucose, Bld: 96 mg/dL (ref 70–99)
Potassium: 3.7 mEq/L (ref 3.5–5.1)
Sodium: 137 mEq/L (ref 135–145)

## 2012-03-23 LAB — URINALYSIS, ROUTINE W REFLEX MICROSCOPIC
Bilirubin Urine: NEGATIVE
Glucose, UA: NEGATIVE mg/dL
Hgb urine dipstick: NEGATIVE
Ketones, ur: NEGATIVE mg/dL
Leukocytes, UA: NEGATIVE
Nitrite: NEGATIVE
Protein, ur: NEGATIVE mg/dL
Specific Gravity, Urine: 1.015 (ref 1.005–1.030)
Urobilinogen, UA: 0.2 mg/dL (ref 0.0–1.0)
pH: 6.5 (ref 5.0–8.0)

## 2012-03-23 NOTE — ED Notes (Signed)
Pt. Alert and oriented x4. No respiratory distress noted. 96% on RA

## 2012-03-23 NOTE — ED Notes (Signed)
Pt in xray

## 2012-03-23 NOTE — ED Provider Notes (Signed)
History     CSN: [id number]  Arrival date & time 03/23/12  0214   First MD Initiated Contact with Patient 03/23/12 [phone]      Chief Complaint  Patient presents with  . Shortness of Breath   HPI  History provided by the patient and daughter. Patient is a 66 year old female history of Parkinson's disease and dementia who presents with complaints of shortness of breath and peripheral numbness. Daughter reports that she was asleep in another room when she was called by the patient around midnight. Patient had gotten up to use the restroom when she was getting back to bed complaining that her legs felt numb. Patient was having a difficult time getting back into bed. Daughter states this is not uncommon she has been called to the room for this in the past. Patient had never complained of numbness in the legs however. Patient back to sleep.returned to her room and was later called back into the room and patient was complaining of difficulty breathing. Patient had rollover onto the stomach was unable to roll back onto her back. Daughter states patient did seem to feel some improvements. Currently patient is not complaining of any shortness of breath or numbness or tingling in feet. Patient does complain of feeling weak and tired. She denies any recent fever, chills or sweats. No urinary changes.     Past Medical History  Diagnosis Date  . Dysmetabolic syndrome X   . Personal history of thrombophlebitis   . Essential hypertension, benign   . Morbid obesity   . Mixed hyperlipidemia   . Nonspecific abnormal electrocardiogram (ECG) (EKG)   . Special screening for malignant neoplasms of other sites   . Depressive disorder, not elsewhere classified   . Bell's palsy   . Need for prophylactic hormone replacement therapy (postmenopausal)   . Adjustment disorder with depressed mood   . Need for prophylactic vaccination and inoculation against influenza   . DVT (deep venous thrombosis), left "early  2000's"    LLE  . Syncope and collapse 12/06/11    "this am; I pass out fairly often"  . Type II or unspecified type diabetes mellitus without mention of complication, not stated as uncontrolled 12/06/11    "not anymore; had lap band OR"  . Neurodegenerative gait disorder   . Headache 05/2011    "pretty often since they put me on Parkinson's medicine"  . Parkinson disease     "they think that's what it is"  . DEMENTIA   . 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 Anxiety   . History of shingles     "as a teen and in my 58's"    Past Surgical History  Procedure Date  . Tonsillectomy and adenoidectomy 1970  . Bladder tack 11/1995  . Septoplasty 08/1998    with antral window   . Knee arthroscopy 12/12/2002    right   . Fracture surgery 02/09/2006    bilateral elbows  . Fracture surgery     right knee  . Fracture surgery     tib plateau  . Breast cyst aspiration 12/1998    bilaterally  . Breast biopsy 05/20/2000    bilaterally  . Tubal ligation 1970's  . Cataract extraction w/ intraocular lens  implant, bilateral   . Laparoscopic gastric banding 2008  . Cardiac catheterization 2004    Family History  Problem Relation Age of Onset  . COPD Father   . Diabetes  Sister   . Vision loss Sister     legally blind  . Asthma Mother   . Heart disease Father     History  Substance Use Topics  . Smoking status: Never Smoker   . Smokeless tobacco: Never Used  . Alcohol Use: No    OB History    Grav Para Term Preterm Abortions TAB SAB Ect Mult Living                  Review of Systems  Constitutional: Positive for fatigue. Negative for fever, chills and appetite change.  Respiratory: Positive for shortness of breath. Negative for cough.   Cardiovascular: Negative for chest pain and palpitations.  Gastrointestinal: Negative for nausea, vomiting, abdominal pain, diarrhea and constipation.  Genitourinary: Negative for dysuria, frequency,  hematuria and flank pain.  Neurological: Positive for tremors and weakness.    Allergies  Codeine sulfate and Penicillins  Home Medications   Current Outpatient Rx  Name Route Sig Dispense Refill  . ALPRAZOLAM 0.5 MG PO TABS Oral Take 1 tablet (0.5 mg total) by mouth 2 (two) times daily. 180 tablet 0  . REFRESH P.M. OP OINT Both Eyes Place into both eyes as needed. For dry or irritated eyes 3.5 g 12  . ASPIRIN 81 MG PO TABS Oral Take 81 mg by mouth daily.     Marland Kitchen GRAPE SEED PO Oral Take 1 tablet by mouth daily.    Marland Kitchen CALCIUM SOFT CHEWS PO Oral Take by mouth.    . CARBIDOPA-LEVODOPA 25-100 MG PO TABS Oral Take 3 tablets by mouth 3 (three) times daily.    Marland Kitchen VITAMIN D3 1000 UNITS PO CAPS Oral Take 1 capsule by mouth daily.      Marland Kitchen FLUOXETINE HCL 20 MG PO CAPS Oral Take 20 mg by mouth 2 (two) times daily.    Marland Kitchen LOSARTAN POTASSIUM-HCTZ 50-12.5 MG PO TABS Oral Take 1 tablet by mouth daily.    . ADULT MULTIVITAMIN W/MINERALS CH Oral Take 1 tablet by mouth daily.    Marland Kitchen SIMVASTATIN 80 MG PO TABS  TAKE 1 TABLET AT BEDTIME 90 tablet 2  . VITAMIN B-12 1000 MCG PO TABS Oral Take 1,000 mcg by mouth daily.        BP 155/83  Pulse 63  Temp 97.9 F (36.6 C) (Oral)  Resp 18  SpO2 96%  Physical Exam  Nursing note and vitals reviewed. Constitutional: She is oriented to person, place, and time. She appears well-developed and well-nourished. No distress.  HENT:  Head: Normocephalic.  Cardiovascular: Normal rate and regular rhythm.   Pulmonary/Chest: Effort normal and breath sounds normal. No respiratory distress. She has no wheezes. She has no rales.  Abdominal: Soft. There is no tenderness. There is no rebound and no guarding.  Musculoskeletal: Normal range of motion. She exhibits no edema and no tenderness.       Normal dorsal pedal pulses bilaterally. Patient reports normal sensation to light touch in both feet. Strength equal bilaterally.  Neurological: She is alert and oriented to person, place,  and time.  Skin: Skin is warm and dry. No rash noted. No erythema.  Psychiatric: She has a normal mood and affect. Her behavior is normal.    ED Course  Procedures   Results for orders placed during the hospital encounter of 03/23/12  CBC WITH DIFFERENTIAL      Component Value Range   WBC 8.8  4.0 - 10.5 K/uL   RBC 4.31  3.87 - 5.11 MIL/uL  Hemoglobin 13.7  12.0 - 15.0 g/dL   HCT 16.1  09.6 - 04.5 %   MCV 92.3  78.0 - 100.0 fL   MCH 31.8  26.0 - 34.0 pg   MCHC 34.4  30.0 - 36.0 g/dL   RDW 40.9  81.1 - 91.4 %   Platelets 249  150 - 400 K/uL   Neutrophils Relative 67  43 - 77 %   Neutro Abs 5.9  1.7 - 7.7 K/uL   Lymphocytes Relative 21  12 - 46 %   Lymphs Abs 1.9  0.7 - 4.0 K/uL   Monocytes Relative 10  3 - 12 %   Monocytes Absolute 0.9  0.1 - 1.0 K/uL   Eosinophils Relative 2  0 - 5 %   Eosinophils Absolute 0.2  0.0 - 0.7 K/uL   Basophils Relative 1  0 - 1 %   Basophils Absolute 0.0  0.0 - 0.1 K/uL  BASIC METABOLIC PANEL      Component Value Range   Sodium 137  135 - 145 mEq/L   Potassium 3.7  3.5 - 5.1 mEq/L   Chloride 99  96 - 112 mEq/L   CO2 28  19 - 32 mEq/L   Glucose, Bld 96  70 - 99 mg/dL   BUN 16  6 - 23 mg/dL   Creatinine, Ser 7.82  0.50 - 1.10 mg/dL   Calcium 95.6  8.4 - 21.3 mg/dL   GFR calc non Af Amer >90  >90 mL/min   GFR calc Af Amer >90  >90 mL/min  URINALYSIS, ROUTINE W REFLEX MICROSCOPIC      Component Value Range   Color, Urine YELLOW  YELLOW   APPearance CLEAR  CLEAR   Specific Gravity, Urine 1.015  1.005 - 1.030   pH 6.5  5.0 - 8.0   Glucose, UA NEGATIVE  NEGATIVE mg/dL   Hgb urine dipstick NEGATIVE  NEGATIVE   Bilirubin Urine NEGATIVE  NEGATIVE   Ketones, ur NEGATIVE  NEGATIVE mg/dL   Protein, ur NEGATIVE  NEGATIVE mg/dL   Urobilinogen, UA 0.2  0.0 - 1.0 mg/dL   Nitrite NEGATIVE  NEGATIVE   Leukocytes, UA NEGATIVE  NEGATIVE      Dg Chest 2 View  03/23/2012  *RADIOLOGY REPORT*  Clinical Data: Shortness of breath, weakness.  CHEST - 2  VIEW  Comparison: 01/31/2012  Findings: Heart size upper normal.  Mild aortic tortuosity. Mediastinal contours otherwise within normal range. Lungs are essentially clear.  No pleural effusion or pneumothorax. Multilevel degenerative change.  No acute osseous finding.  Gastric band.  IMPRESSION: No radiographic evidence of acute cardiopulmonary process.   Original Report Authenticated By: Waneta Martins, M.D.      1. Shortness of breath       MDM  2:50AM patient seen and evaluated. Patient resting comfortably in bed without significant complaints at this time. Normal respirations and O2 sats.  Labs unremarkable. EKG and chest x-ray also without any significant findings or changes. Patient has continued to be asymptomatic while in the emergency room. At this time will have patient return home with daughter. She will followup with PCP     Date: 03/23/2012  Rate: 66  Rhythm: normal sinus rhythm  QRS Axis: left  Intervals: normal  ST/T Wave abnormalities: nonspecific ST/T changes  Conduction Disutrbances:left anterior fascicular block  Narrative Interpretation: Slight RVH  Old EKG Reviewed: No significant changes from 01/30/2012    Angus Seller, PA 03/23/12 [phone]

## 2012-03-23 NOTE — ED Notes (Signed)
Per EMS, pt c/o of SOB. Family found pt. Lying on stomach with face in pillow, turned pt onto back and SOB subsided. Pt. No complaints currently.

## 2012-03-23 NOTE — ED Notes (Signed)
Family at bedside. 

## 2012-03-23 NOTE — ED Provider Notes (Signed)
Medical screening examination/treatment/procedure(s) were performed by non-physician practitioner and as supervising physician I was immediately available for consultation/collaboration.  Olivia Mackie, MD 03/23/12 [phone]

## 2012-03-23 NOTE — ED Notes (Signed)
Pt. Reports she got up to go to the bathroom, stated SOB and that her legs went numb. Lung sounds clear. Pedal pulses strong. Pt. Has no complaints at this time. Family states pt has had increase in fatigue lately

## 2012-03-24 ENCOUNTER — Ambulatory Visit (INDEPENDENT_AMBULATORY_CARE_PROVIDER_SITE_OTHER): Payer: 59 | Admitting: Psychiatry

## 2012-03-24 ENCOUNTER — Encounter (HOSPITAL_COMMUNITY): Payer: Self-pay | Admitting: Psychiatry

## 2012-03-24 VITALS — BP 130/92 | HR 72 | Resp 12 | Wt 144.4 lb

## 2012-03-24 DIAGNOSIS — F329 Major depressive disorder, single episode, unspecified: Secondary | ICD-10-CM

## 2012-03-24 MED ORDER — BUPROPION HCL ER (XL) 150 MG PO TB24: ORAL_TABLET | ORAL | Status: DC

## 2012-03-24 NOTE — Progress Notes (Signed)
Psychiatric Assessment Adult  Patient Identification:  [name] Date of Evaluation:  03/24/2012 Chief Complaint: depressed History of Chief Complaint:  No chief complaint on file. 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 The patient lives with her daughter and her 73 year old granddaughter. Her husband died 5 years ago from heart disease. She had a good marriage for 36 years. The patient has 2 children. One is [name] who is interviewed during this session and who she lives with and has a son named [name] is also married with a child. 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 The job that she loves she loves her peers and her supervisor. It is a very secure job yet the patient seems to feel that she's not doing a good job. Presently the patient has been on medical leave since July 16. 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 Today the patient describes persistent daily depression that has been present for approximately 2 months. The daughter says it's been more like 6 months. The patient is is sleeping excessively at night and during the day. The patient has had a significant weight loss of 16 pounds in the last 2 months as her appetite is nearly absent. The patient has a reduction in her energy and has some problems that she complains in terms of concentration. The patient acknowledges that she feels worthless. She denies being actively suicidal. He things often about death but claims she's not brave enough to end her life. In the less response to the question if he had a choice would you and your life she agreed she would strongly consider it. This patient denies the use of alcohol. She shows no evidence of psychosis. She's never had an episode of mania and in close evaluation she  really has never had a previous episode of major depression. Even when her husband died her depression was short-lived at that time. The patient denies anxiety symptoms of generalized anxiety disorder, panic disorder or OCD. Over the last 3 months the patient because of her failing to thrive has had ago due using a cane. She's having difficulty walking as she seems to be deconditioned by both her lack of appetite and her mood state. Financially she is stable and she's not had any deaths. She has an appointment in the next few weeks with her neurologist is a new neurologist and she's been diagnosed with Parkinson's and possibly an associated dementing process. I suspect this new neurologist will evaluate this closely. At this time she is only taking medicines for Parkinson's but no medications for memory. She's been on Prozac 40 mg from her primary care doctor for a number of months which apparently has not been effective. She takes a fixed regular dose of Xanax 0.5 mg twice a day. Noted is she has a really absent psychiatric history. She's never been in a psychiatric hospital nor ever seen a psychiatrist before. She came today and made this appointment if she wanted to talk about how she is feeling. He's never been in one-to-one talking treatment either and I think is looking for this.  HPI Review of Systems Physical Exam  Depressive Symptoms: depressed mood,  (Hypo) Manic Symptoms:   Elevated Mood:  No Irritable Mood:  No Grandiosity:  No Distractibility:  No Labiality of Mood:  No Delusions:  No Hallucinations:  No Impulsivity:  No Sexually Inappropriate Behavior:  No Financial Extravagance:  No Flight of Ideas:  No  Anxiety Symptoms: Excessive Worry:  No Panic Symptoms:  No Agoraphobia:  No Obsessive Compulsive: No  Symptoms: None, Specific Phobias:  No Social Anxiety:  No  Psychotic Symptoms:  Hallucinations: No None Delusions:  No Paranoia:  No   Ideas of Reference:   No  PTSD Symptoms: Ever had a traumatic exposure:  No Had a traumatic exposure in the last month:  No Re-experiencing: No None Hypervigilance:  No Hyperarousal: No None Avoidance: No None  Traumatic Brain Injury: No   Past Psychiatric History:none Diagnosis: none               Past Medical History:   Past Medical History  Diagnosis Date  . Dysmetabolic syndrome X   . Personal history of thrombophlebitis   . Essential hypertension, benign   . Morbid obesity   . Mixed hyperlipidemia   . Nonspecific abnormal electrocardiogram (ECG) (EKG)   . Special screening for malignant neoplasms of other sites   . Depressive disorder, not elsewhere classified   . Bell's palsy   . Need for prophylactic hormone replacement therapy (postmenopausal)   . Adjustment disorder with depressed mood   . Need for prophylactic vaccination and inoculation against influenza   . DVT (deep venous thrombosis), left "early 2000's"    LLE  . Syncope and collapse 12/06/11    "this am; I pass out fairly often"  . Type II or unspecified type diabetes mellitus without mention of complication, not stated as uncontrolled 12/06/11    "not anymore; had lap band OR"  . Neurodegenerative gait disorder   . Headache 05/2011    "pretty often since they put me on Parkinson's medicine"  . Parkinson disease     "they think that's what it is"  . DEMENTIA   . 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 Anxiety   . History of shingles     "as a teen and in my 30's"   History of Loss of Consciousness:  No Seizure History:  No Cardiac History:  No Allergies:   Allergies  Allergen Reactions  . Codeine Sulfate     REACTION: hallucinations  . Penicillins Anaphylaxis, Swelling and Rash   Current Medications:  Current Outpatient Prescriptions  Medication Sig Dispense Refill  . ALPRAZolam (XANAX) 0.5 MG tablet Take 1 tablet (0.5 mg total) by mouth 2 (two) times daily.  180 tablet  0  .  Artificial Tear Ointment (ARTIFICIAL TEARS) ointment Place into both eyes as needed. For dry or irritated eyes  3.5 g  12  . aspirin 81 MG tablet Take 81 mg by mouth daily.       Marland Kitchen Bioflavonoid Products (GRAPE SEED PO) Take 1 tablet by mouth daily.      . Calcium-Vitamin D-Vitamin K (CALCIUM SOFT CHEWS PO) Take by mouth.      . carbidopa-levodopa (SINEMET IR) 25-100 MG per tablet Take 3 tablets by mouth 3 (three) times daily.      . Cholecalciferol (VITAMIN D3) 1000 UNITS CAPS Take 1 capsule by mouth daily.        Marland Kitchen FLUoxetine (PROZAC) 20 MG capsule Take 20 mg by mouth 2 (two) times daily.      Marland Kitchen losartan-hydrochlorothiazide (HYZAAR) 50-12.5 MG per tablet Take 1 tablet by mouth daily.      . Multiple  Vitamin (MULTIVITAMIN WITH MINERALS) TABS Take 1 tablet by mouth daily.      . simvastatin (ZOCOR) 80 MG tablet TAKE 1 TABLET AT BEDTIME  90 tablet  2  . vitamin B-12 (CYANOCOBALAMIN) 1000 MCG tablet Take 1,000 mcg by mouth daily.          Previous Psychotropic Medications:  Medication Dose                          Substance Abuse History in the last 12 months: Substance Age of 1st Use Last Use Amount Specific Type    Medical Consequences of Substance Abuse:   Legal Consequences of Substance Abuse:   Family Consequences of Substance Abuse:   Blackouts:   DT's:   Withdrawal Symptoms:   None  Social History: Current Place of Residence: [place] of Birth:  Family Members:  Marital Status:  Widowed Children: 2  Sons:    Daughters:   Relationships:   Education:  [employer] Problems/Performance:  Religious Beliefs/Practices:  History of Abuse:  Teacher, music History:  None. Legal History:  Hobbies/Interests:   Family History:   Family History  Problem Relation Age of Onset  . COPD Father   . Diabetes Sister   . Vision loss Sister     legally blind  . Asthma Mother   . Heart disease Father     Mental Status  Examination/Evaluation: Objective:  Appearance: Casual  Eye Contact::  Good  Speech:  Clear and Coherent  Volume:  Normal  Mood:  Dep[ressed  Affect:  Depressed  Thought Process:  Coherent  Orientation:  Full  Thought Content:  WDL  Suicidal Thoughts:  No  Homicidal Thoughts:  No  Judgement:  Good  Insight:  Good  Psychomotor Activity:  Decreased  Akathisia:  No  Handed:  Right  AIMS (if indicated):    Assets:  Desire for Improvement    Laboratory/X-Ray Psychological Evaluation(s)        Assessment:  Axis I: Major Depression, single episode  AXIS I Major Depression, single episode  AXIS II Deferred  AXIS III Past Medical History  Diagnosis Date  . Dysmetabolic syndrome X   . Personal history of thrombophlebitis   . Essential hypertension, benign   . Morbid obesity   . Mixed hyperlipidemia   . Nonspecific abnormal electrocardiogram (ECG) (EKG)   . Special screening for malignant neoplasms of other sites   . Depressive disorder, not elsewhere classified   . Bell's palsy   . Need for prophylactic hormone replacement therapy (postmenopausal)   . Adjustment disorder with depressed mood   . Need for prophylactic vaccination and inoculation against influenza   . DVT (deep venous thrombosis), left "early 2000's"    LLE  . Syncope and collapse 12/06/11    "this am; I pass out fairly often"  . Type II or unspecified type diabetes mellitus without mention of complication, not stated as uncontrolled 12/06/11    "not anymore; had lap band OR"  . Neurodegenerative gait disorder   . Headache 05/2011    "pretty often since they put me on Parkinson's medicine"  . Parkinson disease     "they think that's what it is"  . DEMENTIA   . 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 Anxiety   . History of shingles     "as a teen and in my 34's"     AXIS  IV occupational problems  AXIS V 51-60 moderate symptoms   Treatment Plan/Recommendations:  Plan of  Care:at this time we will go ahead and reduce her Prozac to taking just 20 mg in the morning. The patient has been on Wellbutrin in the distant past but never took any more than 300 mg. At this time we'll go ahead and: Some Wellbutrin and building up to 450 mg over the next 2 weeks. I will then see her proximally 7 weeks later. She shall continue taking Xanax 0.5 mg twice a day. Today she was also given the telephone #2 Ms. Judithe Modest at [facility] a therapist in our community. On her next visit we will reevaluate the issues of memory.  Laboratory:    Psychotherapy: Referred to Judithe Modest  Medications: Prozac 40 mg  Routine PRN Medications:    Consultations:   Safety Concerns:    Other:      Lucas Mallow, MD 8/30/201311:37 AM

## 2012-03-27 ENCOUNTER — Inpatient Hospital Stay (HOSPITAL_COMMUNITY)
Admission: EM | Admit: 2012-03-27 | Discharge: 2012-04-01 | DRG: 057 | Disposition: A | Discharge status: Skilled Nursing Facility | Payer: 59 | Attending: Internal Medicine | Admitting: Internal Medicine

## 2012-03-27 ENCOUNTER — Emergency Department (HOSPITAL_COMMUNITY): Payer: 59

## 2012-03-27 ENCOUNTER — Encounter (HOSPITAL_COMMUNITY): Payer: Self-pay | Admitting: *Deleted

## 2012-03-27 DIAGNOSIS — Z79899 Other long term (current) drug therapy: Secondary | ICD-10-CM

## 2012-03-27 DIAGNOSIS — G3183 Dementia with Lewy bodies: Principal | ICD-10-CM | POA: Diagnosis present

## 2012-03-27 DIAGNOSIS — M949 Disorder of cartilage, unspecified: Secondary | ICD-10-CM

## 2012-03-27 DIAGNOSIS — E785 Hyperlipidemia, unspecified: Secondary | ICD-10-CM | POA: Diagnosis present

## 2012-03-27 DIAGNOSIS — R5383 Other fatigue: Secondary | ICD-10-CM

## 2012-03-27 DIAGNOSIS — B9689 Other specified bacterial agents as the cause of diseases classified elsewhere: Secondary | ICD-10-CM | POA: Diagnosis present

## 2012-03-27 DIAGNOSIS — I10 Essential (primary) hypertension: Secondary | ICD-10-CM

## 2012-03-27 DIAGNOSIS — F4321 Adjustment disorder with depressed mood: Secondary | ICD-10-CM

## 2012-03-27 DIAGNOSIS — R42 Dizziness and giddiness: Secondary | ICD-10-CM

## 2012-03-27 DIAGNOSIS — F3289 Other specified depressive episodes: Secondary | ICD-10-CM

## 2012-03-27 DIAGNOSIS — E8881 Metabolic syndrome: Secondary | ICD-10-CM

## 2012-03-27 DIAGNOSIS — T7840XA Allergy, unspecified, initial encounter: Secondary | ICD-10-CM

## 2012-03-27 DIAGNOSIS — M79609 Pain in unspecified limb: Secondary | ICD-10-CM

## 2012-03-27 DIAGNOSIS — Z0000 Encounter for general adult medical examination without abnormal findings: Secondary | ICD-10-CM

## 2012-03-27 DIAGNOSIS — R481 Agnosia: Secondary | ICD-10-CM

## 2012-03-27 DIAGNOSIS — N63 Unspecified lump in unspecified breast: Secondary | ICD-10-CM

## 2012-03-27 DIAGNOSIS — R51 Headache: Secondary | ICD-10-CM

## 2012-03-27 DIAGNOSIS — F068 Other specified mental disorders due to known physiological condition: Secondary | ICD-10-CM

## 2012-03-27 DIAGNOSIS — R300 Dysuria: Secondary | ICD-10-CM

## 2012-03-27 DIAGNOSIS — F341 Dysthymic disorder: Secondary | ICD-10-CM

## 2012-03-27 DIAGNOSIS — M899 Disorder of bone, unspecified: Secondary | ICD-10-CM

## 2012-03-27 DIAGNOSIS — G20A1 Parkinson's disease without dyskinesia, without mention of fluctuations: Secondary | ICD-10-CM

## 2012-03-27 DIAGNOSIS — E782 Mixed hyperlipidemia: Secondary | ICD-10-CM

## 2012-03-27 DIAGNOSIS — R443 Hallucinations, unspecified: Secondary | ICD-10-CM

## 2012-03-27 DIAGNOSIS — R430 Anosmia: Secondary | ICD-10-CM

## 2012-03-27 DIAGNOSIS — E119 Type 2 diabetes mellitus without complications: Secondary | ICD-10-CM

## 2012-03-27 DIAGNOSIS — F411 Generalized anxiety disorder: Secondary | ICD-10-CM | POA: Diagnosis present

## 2012-03-27 DIAGNOSIS — R55 Syncope and collapse: Secondary | ICD-10-CM

## 2012-03-27 DIAGNOSIS — F0280 Dementia in other diseases classified elsewhere without behavioral disturbance: Principal | ICD-10-CM | POA: Diagnosis present

## 2012-03-27 DIAGNOSIS — F329 Major depressive disorder, single episode, unspecified: Secondary | ICD-10-CM

## 2012-03-27 DIAGNOSIS — G510 Bell's palsy: Secondary | ICD-10-CM

## 2012-03-27 DIAGNOSIS — I951 Orthostatic hypotension: Secondary | ICD-10-CM

## 2012-03-27 DIAGNOSIS — Z66 Do not resuscitate: Secondary | ICD-10-CM | POA: Diagnosis present

## 2012-03-27 DIAGNOSIS — N390 Urinary tract infection, site not specified: Secondary | ICD-10-CM

## 2012-03-27 DIAGNOSIS — G4710 Hypersomnia, unspecified: Secondary | ICD-10-CM

## 2012-03-27 DIAGNOSIS — R5381 Other malaise: Secondary | ICD-10-CM

## 2012-03-27 DIAGNOSIS — Z86718 Personal history of other venous thrombosis and embolism: Secondary | ICD-10-CM

## 2012-03-27 LAB — URINE MICROSCOPIC-ADD ON

## 2012-03-27 LAB — COMPREHENSIVE METABOLIC PANEL
ALT: <5 U/L (ref 0–35)
AST: 14 U/L (ref 0–37)
Albumin: 3.6 g/dL (ref 3.5–5.2)
Alkaline Phosphatase: 58 U/L (ref 39–117)
BUN: 19 mg/dL (ref 6–23)
CO2: 24 mEq/L (ref 19–32)
Calcium: 10.4 mg/dL (ref 8.4–10.5)
Chloride: 97 mEq/L (ref 96–112)
Creatinine, Ser: <0.2 mg/dL — ABNORMAL LOW (ref 0.50–1.10)
Glucose, Bld: 170 mg/dL — ABNORMAL HIGH (ref 70–99)
Potassium: 3.4 mEq/L — ABNORMAL LOW (ref 3.5–5.1)
Sodium: 135 mEq/L (ref 135–145)
Total Bilirubin: 0.5 mg/dL (ref 0.3–1.2)
Total Protein: 6.3 g/dL (ref 6.0–8.3)

## 2012-03-27 LAB — CBC WITH DIFFERENTIAL/PLATELET
Basophils Absolute: 0 10*3/uL (ref 0.0–0.1)
Basophils Relative: 0 % (ref 0–1)
Eosinophils Absolute: 0 10*3/uL (ref 0.0–0.7)
Eosinophils Relative: 0 % (ref 0–5)
HCT: 38.9 % (ref 36.0–46.0)
Hemoglobin: 13.5 g/dL (ref 12.0–15.0)
Lymphocytes Relative: 8 % — ABNORMAL LOW (ref 12–46)
Lymphs Abs: 1.1 10*3/uL (ref 0.7–4.0)
MCH: 32.1 pg (ref 26.0–34.0)
MCHC: 34.7 g/dL (ref 30.0–36.0)
MCV: 92.4 fL (ref 78.0–100.0)
Monocytes Absolute: 0.9 10*3/uL (ref 0.1–1.0)
Monocytes Relative: 7 % (ref 3–12)
Neutro Abs: 11 10*3/uL — ABNORMAL HIGH (ref 1.7–7.7)
Neutrophils Relative %: 84 % — ABNORMAL HIGH (ref 43–77)
Platelets: 269 10*3/uL (ref 150–400)
RBC: 4.21 MIL/uL (ref 3.87–5.11)
RDW: 12.9 % (ref 11.5–15.5)
WBC: 13.1 10*3/uL — ABNORMAL HIGH (ref 4.0–10.5)

## 2012-03-27 LAB — VITAMIN B12: Vitamin B-12: 1297 pg/mL — ABNORMAL HIGH (ref 211–911)

## 2012-03-27 LAB — URINALYSIS, ROUTINE W REFLEX MICROSCOPIC
Bilirubin Urine: NEGATIVE
Glucose, UA: NEGATIVE mg/dL
Hgb urine dipstick: NEGATIVE
Ketones, ur: 15 mg/dL — AB
Nitrite: NEGATIVE
Protein, ur: NEGATIVE mg/dL
Specific Gravity, Urine: 1.026 (ref 1.005–1.030)
Urobilinogen, UA: 0.2 mg/dL (ref 0.0–1.0)
pH: 7 (ref 5.0–8.0)

## 2012-03-27 LAB — PROTIME-INR
INR: 0.98 (ref 0.00–1.49)
Prothrombin Time: 13.2 s (ref 11.6–15.2)

## 2012-03-27 LAB — GLUCOSE, CAPILLARY: Glucose-Capillary: 221 mg/dL — ABNORMAL HIGH (ref 70–99)

## 2012-03-27 LAB — TSH: TSH: 0.481 u[IU]/mL (ref 0.350–4.500)

## 2012-03-27 LAB — FOLATE: Folate: >20 ng/mL

## 2012-03-27 MED ORDER — SODIUM CHLORIDE 0.9 % IV BOLUS (SEPSIS)
1000.0000 mL | Freq: Once | INTRAVENOUS | Status: AC
  Administered 2012-03-27: 1000 mL via INTRAVENOUS

## 2012-03-27 MED ORDER — ALBUTEROL SULFATE (5 MG/ML) 0.5% IN NEBU: 2.5000 mg | INHALATION_SOLUTION | RESPIRATORY_TRACT | Status: DC | PRN

## 2012-03-27 MED ORDER — ACETAMINOPHEN 650 MG RE SUPP: 650.0000 mg | Freq: Four times a day (QID) | RECTAL | Status: DC | PRN

## 2012-03-27 MED ORDER — ONDANSETRON HCL 4 MG/2ML IJ SOLN
4.0000 mg | Freq: Once | INTRAMUSCULAR | Status: AC
  Administered 2012-03-27: 4 mg via INTRAVENOUS
  Filled 2012-03-27: qty 2

## 2012-03-27 MED ORDER — ONDANSETRON HCL 4 MG PO TABS: 4.0000 mg | ORAL_TABLET | Freq: Four times a day (QID) | ORAL | Status: DC | PRN

## 2012-03-27 MED ORDER — ADULT MULTIVITAMIN W/MINERALS CH
1.0000 | ORAL_TABLET | Freq: Every day | ORAL | Status: DC
  Administered 2012-03-28 – 2012-04-01 (×5): 1 via ORAL
  Filled 2012-03-27 (×6): qty 1

## 2012-03-27 MED ORDER — HYDROCHLOROTHIAZIDE 12.5 MG PO CAPS
12.5000 mg | ORAL_CAPSULE | Freq: Every day | ORAL | Status: DC
  Administered 2012-03-28 – 2012-04-01 (×5): 12.5 mg via ORAL
  Filled 2012-03-27 (×6): qty 1

## 2012-03-27 MED ORDER — HYPROMELLOSE (GONIOSCOPIC) 2.5 % OP SOLN: 1.0000 [drp] | Freq: Every day | OPHTHALMIC | Status: DC

## 2012-03-27 MED ORDER — GUAIFENESIN-DM 100-10 MG/5ML PO SYRP: 5.0000 mL | ORAL_SOLUTION | ORAL | Status: DC | PRN

## 2012-03-27 MED ORDER — ONDANSETRON HCL 4 MG/2ML IJ SOLN: 4.0000 mg | Freq: Four times a day (QID) | INTRAMUSCULAR | Status: DC | PRN

## 2012-03-27 MED ORDER — SENNOSIDES-DOCUSATE SODIUM 8.6-50 MG PO TABS
1.0000 | ORAL_TABLET | Freq: Every evening | ORAL | Status: DC | PRN
  Filled 2012-03-27: qty 1

## 2012-03-27 MED ORDER — POTASSIUM CHLORIDE IN NACL 40-0.9 MEQ/L-% IV SOLN
INTRAVENOUS | Status: AC
  Administered 2012-03-27 – 2012-03-28 (×2): via INTRAVENOUS
  Filled 2012-03-27 (×2): qty 1000

## 2012-03-27 MED ORDER — FLUOXETINE HCL 20 MG PO CAPS
20.0000 mg | ORAL_CAPSULE | Freq: Every day | ORAL | Status: DC
  Administered 2012-03-28 – 2012-04-01 (×5): 20 mg via ORAL
  Filled 2012-03-27 (×6): qty 1

## 2012-03-27 MED ORDER — POLYVINYL ALCOHOL 1.4 % OP SOLN
1.0000 [drp] | Freq: Every day | OPHTHALMIC | Status: DC
  Administered 2012-03-28 – 2012-04-01 (×5): 1 [drp] via OPHTHALMIC
  Filled 2012-03-27: qty 15

## 2012-03-27 MED ORDER — HYDROCODONE-ACETAMINOPHEN 5-325 MG PO TABS: 1.0000 | ORAL_TABLET | Freq: Four times a day (QID) | ORAL | Status: DC | PRN

## 2012-03-27 MED ORDER — ATORVASTATIN CALCIUM 40 MG PO TABS
40.0000 mg | ORAL_TABLET | Freq: Every day | ORAL | Status: DC
  Administered 2012-03-28 – 2012-03-31 (×4): 40 mg via ORAL
  Filled 2012-03-27 (×6): qty 1

## 2012-03-27 MED ORDER — CEFTRIAXONE SODIUM 1 G IJ SOLR
1.0000 g | INTRAMUSCULAR | Status: AC
  Administered 2012-03-28 – 2012-03-30 (×3): 1 g via INTRAVENOUS
  Filled 2012-03-27 (×3): qty 10

## 2012-03-27 MED ORDER — DEXTROSE 5 % IV SOLN
1.0000 g | Freq: Once | INTRAVENOUS | Status: AC
  Administered 2012-03-27: 1 g via INTRAVENOUS
  Filled 2012-03-27: qty 10

## 2012-03-27 MED ORDER — RISPERIDONE 0.25 MG PO TABS
0.2500 mg | ORAL_TABLET | Freq: Two times a day (BID) | ORAL | Status: DC
  Administered 2012-03-27 – 2012-03-28 (×2): 0.25 mg via ORAL
  Filled 2012-03-27 (×3): qty 1

## 2012-03-27 MED ORDER — VITAMIN B-12 1000 MCG PO TABS
1000.0000 ug | ORAL_TABLET | Freq: Every day | ORAL | Status: DC
  Administered 2012-03-28 – 2012-04-01 (×5): 1000 ug via ORAL
  Filled 2012-03-27 (×6): qty 1

## 2012-03-27 MED ORDER — HEPARIN SODIUM (PORCINE) 5000 UNIT/ML IJ SOLN
5000.0000 [IU] | Freq: Three times a day (TID) | INTRAMUSCULAR | Status: DC
  Administered 2012-03-27 – 2012-04-01 (×14): 5000 [IU] via SUBCUTANEOUS
  Filled 2012-03-27 (×18): qty 1

## 2012-03-27 MED ORDER — ACETAMINOPHEN 325 MG PO TABS: 650.0000 mg | ORAL_TABLET | Freq: Four times a day (QID) | ORAL | Status: DC | PRN

## 2012-03-27 MED ORDER — ALPRAZOLAM 0.5 MG PO TABS
0.5000 mg | ORAL_TABLET | Freq: Two times a day (BID) | ORAL | Status: DC
  Administered 2012-03-27 – 2012-04-01 (×10): 0.5 mg via ORAL
  Filled 2012-03-27 (×10): qty 1

## 2012-03-27 MED ORDER — SODIUM CHLORIDE 0.9 % IV SOLN
INTRAVENOUS | Status: DC
  Administered 2012-03-27: 14:00:00 via INTRAVENOUS

## 2012-03-27 MED ORDER — BUPROPION HCL ER (XL) 150 MG PO TB24
450.0000 mg | ORAL_TABLET | Freq: Every day | ORAL | Status: DC
  Administered 2012-03-28: 450 mg via ORAL
  Filled 2012-03-27 (×2): qty 3

## 2012-03-27 MED ORDER — ASPIRIN 81 MG PO CHEW
81.0000 mg | CHEWABLE_TABLET | Freq: Every day | ORAL | Status: DC
  Administered 2012-03-28 – 2012-04-01 (×5): 81 mg via ORAL
  Filled 2012-03-27 (×6): qty 1

## 2012-03-27 MED ORDER — LOSARTAN POTASSIUM 50 MG PO TABS
50.0000 mg | ORAL_TABLET | Freq: Every day | ORAL | Status: DC
  Administered 2012-03-28 – 2012-04-01 (×5): 50 mg via ORAL
  Filled 2012-03-27 (×6): qty 1

## 2012-03-27 MED ORDER — ASPIRIN 81 MG PO TABS: 81.0000 mg | ORAL_TABLET | Freq: Every day | ORAL | Status: DC

## 2012-03-27 MED ORDER — LOSARTAN POTASSIUM-HCTZ 50-12.5 MG PO TABS: 1.0000 | ORAL_TABLET | Freq: Every day | ORAL | Status: DC

## 2012-03-27 MED ORDER — CARBIDOPA-LEVODOPA 25-100 MG PO TABS
3.0000 | ORAL_TABLET | Freq: Three times a day (TID) | ORAL | Status: DC
  Administered 2012-03-27 – 2012-04-01 (×14): 3 via ORAL
  Filled 2012-03-27 (×19): qty 3

## 2012-03-27 NOTE — ED Notes (Signed)
Report called to floor by previous RN, Clydie Braun.

## 2012-03-27 NOTE — ED Notes (Signed)
Patient stated that she was recently started on Wellbutrin, but began having hallucinations the very next day. Seeing familiar people who have passed, animals, snakes. All of which are frightening her.  She has had hallucinations in the past, but never like these. No other symptoms, denies auditory hallucinations, chest pains and nausea and vomiting.

## 2012-03-27 NOTE — ED Notes (Signed)
[id number]> Expected date:<BR> Expected time:<BR> Means of arrival:<BR> Comments:<BR> Hallucinations, recent change in meds, parkinsons pt

## 2012-03-27 NOTE — ED Notes (Addendum)
Pt. c/o nausea. Charge RN [employer] notified.

## 2012-03-27 NOTE — ED Notes (Addendum)
Pt in CT.

## 2012-03-27 NOTE — ED Provider Notes (Signed)
History     CSN: [id number]  Arrival date & time 03/27/12  1027   First MD Initiated Contact with Patient 03/27/12 [phone]      Chief Complaint  Patient presents with  . Hallucinations    (Consider location/radiation/quality/duration/timing/severity/associated sxs/prior treatment) HPI Comments: Pt with hx parkinson's disease presents with hallucinations.  Daughter reports patient has not been eating or drinking much over the past few days, is not getting out of bed much, and overnight developed hallucinations.  Patient has had hallucinations before but daughter states she has never had this many and patient states they were scaring her - she was seeing dead relatives, many animals including snakes that she thought were going to attack her, and her daughter notes patient was saying that she thought the daughter was going to attack her or shoot her.  Patient notes the hallucinations were only visual, not auditory, and stopped once she left the house and came to the ED.  Pt notes she has developed a nonproductive cough over the past few days and has been SOB only "when I was scared", and daughter states she said she wasn't eating yesterday because her throat hurt.  Pt was started on Wellbutrin on Saturday for depression.  Denies fever, CP, palpitations, abdominal pain, N/V/D, urinary symptoms, change in bowel habits, abnormal bleeding, leg swelling, new weakness or numbness of the extremities (pt has chronic numbness of the plantar aspect of her bilateral feet, for which she has seen Dr Modesto Charon, [facility] neurology).  Denies recent falls.   PCP is Dr Dayton Martes - [facility] [place].    The history is provided by the patient and a relative.    Past Medical History  Diagnosis Date  . Dysmetabolic syndrome X   . Personal history of thrombophlebitis   . Essential hypertension, benign   . Morbid obesity   . Mixed hyperlipidemia   . Nonspecific abnormal electrocardiogram (ECG) (EKG)   . Special screening for  malignant neoplasms of other sites   . Depressive disorder, not elsewhere classified   . Bell's palsy   . Need for prophylactic hormone replacement therapy (postmenopausal)   . Adjustment disorder with depressed mood   . Need for prophylactic vaccination and inoculation against influenza   . DVT (deep venous thrombosis), left "early 2000's"    LLE  . Syncope and collapse 12/06/11    "this am; I pass out fairly often"  . Type II or unspecified type diabetes mellitus without mention of complication, not stated as uncontrolled 12/06/11    "not anymore; had lap band OR"  . Neurodegenerative gait disorder   . Headache 05/2011    "pretty often since they put me on Parkinson's medicine"  . Parkinson disease     "they think that's what it is"  . DEMENTIA   . 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 Anxiety   . History of shingles     "as a teen and in my 68's"    Past Surgical History  Procedure Date  . Tonsillectomy and adenoidectomy 1970  . Bladder tack 11/1995  . Septoplasty 08/1998    with antral window   . Knee arthroscopy 12/12/2002    right   . Fracture surgery 02/09/2006    bilateral elbows  . Fracture surgery     right knee  . Fracture surgery     tib plateau  . Breast cyst aspiration 12/1998    bilaterally  . Breast  biopsy 05/20/2000    bilaterally  . Tubal ligation 1970's  . Cataract extraction w/ intraocular lens  implant, bilateral   . Laparoscopic gastric banding 2008  . Cardiac catheterization 2004    Family History  Problem Relation Age of Onset  . COPD Father   . Diabetes Sister   . Vision loss Sister     legally blind  . Asthma Mother   . Heart disease Father     History  Substance Use Topics  . Smoking status: Never Smoker   . Smokeless tobacco: Never Used  . Alcohol Use: No    OB History    Grav Para Term Preterm Abortions TAB SAB Ect Mult Living                  Review of Systems  Constitutional: Positive  for appetite change. Negative for fever and chills.  HENT: Positive for sore throat. Negative for trouble swallowing.   Respiratory: Positive for cough and shortness of breath.   Cardiovascular: Negative for chest pain, palpitations and leg swelling.  Gastrointestinal: Negative for nausea, vomiting, abdominal pain, diarrhea, constipation and blood in stool.  Genitourinary: Negative for dysuria, urgency, frequency, hematuria and decreased urine volume.  Musculoskeletal: Negative for back pain.  Neurological: Negative for syncope, weakness and numbness.    Allergies  Codeine sulfate and Penicillins  Home Medications   Current Outpatient Rx  Name Route Sig Dispense Refill  . ALPRAZOLAM 0.5 MG PO TABS Oral Take 0.5 mg by mouth 2 (two) times daily.    . BUPROPION HCL ER (XL) 150 MG PO TB24 Oral Take 150 mg by mouth See admin instructions. Take 1 tablet every morning for 4 days,then take 2 for 5 days,then take 3 tablets every morning.starting on 03/24/12    . SIMVASTATIN 80 MG PO TABS Oral Take 80 mg by mouth at bedtime.    Marland Kitchen REFRESH P.M. OP OINT Both Eyes Place into both eyes as needed. For dry or irritated eyes 3.5 g 12  . ASPIRIN 81 MG PO TABS Oral Take 81 mg by mouth daily.     Marland Kitchen GRAPE SEED PO Oral Take 1 tablet by mouth daily.    Marland Kitchen CALCIUM SOFT CHEWS PO Oral Take by mouth.    . CARBIDOPA-LEVODOPA 25-100 MG PO TABS Oral Take 3 tablets by mouth 3 (three) times daily.    Marland Kitchen VITAMIN D3 1000 UNITS PO CAPS Oral Take 1 capsule by mouth daily.      Marland Kitchen FLUOXETINE HCL 20 MG PO CAPS Oral Take 20 mg by mouth 2 (two) times daily.    Marland Kitchen LOSARTAN POTASSIUM-HCTZ 50-12.5 MG PO TABS Oral Take 1 tablet by mouth daily.    . ADULT MULTIVITAMIN W/MINERALS CH Oral Take 1 tablet by mouth daily.    Marland Kitchen VITAMIN B-12 1000 MCG PO TABS Oral Take 1,000 mcg by mouth daily.        BP 128/54  Pulse 86  Resp 19  SpO2 96%  Physical Exam  Nursing note and vitals reviewed. Constitutional: She is oriented to person,  place, and time. She appears well-developed and well-nourished. No distress.  HENT:  Head: Normocephalic and atraumatic.  Neck: Neck supple.  Cardiovascular: Normal rate and regular rhythm.   Pulmonary/Chest: Effort normal and breath sounds normal. No respiratory distress. She has no wheezes. She has no rales.  Abdominal: Soft. She exhibits no distension and no mass. There is no tenderness. There is no rebound and no guarding.  Neurological: She is  alert and oriented to person, place, and time. She exhibits normal muscle tone.       A&O: knows the president, the year, the day of the week. Oriented to location, self, reason for being in ED. Did get the date wrong (August 1 instead of Sept 2, but states she got it right earlier)  Skin: She is not diaphoretic.    ED Course  Procedures (including critical care time)  Labs Reviewed  CBC WITH DIFFERENTIAL - Abnormal; Notable for the following:    WBC 13.1 (*)     Neutrophils Relative 84 (*)     Neutro Abs 11.0 (*)     Lymphocytes Relative 8 (*)     All other components within normal limits  COMPREHENSIVE METABOLIC PANEL - Abnormal; Notable for the following:    Potassium 3.4 (*)     Glucose, Bld 170 (*)     Creatinine, Ser <0.20 (*)  REPEATED TO VERIFY   All other components within normal limits  URINALYSIS, ROUTINE W REFLEX MICROSCOPIC - Abnormal; Notable for the following:    Color, Urine AMBER (*)  BIOCHEMICALS MAY BE AFFECTED BY COLOR   APPearance TURBID (*)     Ketones, ur 15 (*)     Leukocytes, UA SMALL (*)     All other components within normal limits  GLUCOSE, CAPILLARY - Abnormal; Notable for the following:    Glucose-Capillary 221 (*)     All other components within normal limits  URINE MICROSCOPIC-ADD ON - Abnormal; Notable for the following:    Bacteria, UA MANY (*)     All other components within normal limits  PROTIME-INR  CULTURE, BLOOD (ROUTINE X 2)  CULTURE, BLOOD (ROUTINE X 2)  URINE CULTURE  TSH  VITAMIN B12    FOLATE  BASIC METABOLIC PANEL  CBC   Dg Chest 2 View  03/27/2012  *RADIOLOGY REPORT*  Clinical Data: Hallucinations.  Medication change 2 days ago.  CHEST - 2 VIEW  Comparison: 03/23/2012.  Findings:  Cardiopericardial silhouette within normal limits. Mediastinal contours normal. Trachea midline.  No airspace disease or effusion.  Gastric band appears unchanged.  IMPRESSION: No active cardiopulmonary disease.   Original Report Authenticated By: Andreas Newport, M.D.    Ct Head Wo Contrast  03/27/2012  *RADIOLOGY REPORT*  Clinical Data: Hallucinations.  CT HEAD WITHOUT CONTRAST  Technique:  Contiguous axial images were obtained from the base of the skull through the vertex without contrast.  Comparison: Multiple prior unenhanced cranial CT examinations dating back to 11/11/2007, most recently 01/31/2012.  Findings: Moderate to severe cortical and deep atrophy, unchanged. Mild to moderate changes of small vessel disease of the white matter diffusely, unchanged.  No mass lesion.  No midline shift. No acute hemorrhage or hematoma.  No extra-axial fluid collections. No evidence of acute infarction.  No significant interval change.  Minimal changes of hyperostosis frontalis interna. Visualized paranasal sinuses, bilateral mastoid air cells, and bilateral middle ear cavities well-aerated.  Bilateral carotid siphon atherosclerosis.  IMPRESSION:  1.  No acute intracranial abnormality. 2.  Stable moderate to severe generalized atrophy and mild to moderate chronic microvascular ischemic changes of the white matter.   Original Report Authenticated By: Arnell Sieving, M.D.     10:30 AM Discussed patient with Dr Rubin Payor who will also see the patient.   1. Hallucinations   2. UTI (lower urinary tract infection)       MDM  Pt with hx parkinson's and occasional hallucinations presents with several days of  decreased PO intake, generalized weakness, and increased hallucinations, now scary hallucinations.  Pt  recently started Wellbutrin (2 days ago).  Found to have UTI.  Admitted to [facility] for tx of UTI and observation for persistent hallucinations.          [place], [place] 03/27/12 2034

## 2012-03-27 NOTE — Progress Notes (Signed)
PHARMACY BRIEF NOTE   Rocephin 1 gram IV every 24 hours ordered for this patient for treatment of UTI.  Allergy history states penicillin allergy with reaction as "anaphylaxis, swelling, rash."  First dose of Rocephin was administered in the ED today.  The patient's nurse states that no allergic reaction has occurred.  Discussed with Dr. Thedore Mins and received to continue the Rocephin.  Polo Riley R.Ph. 03/27/2012 6:42 PM

## 2012-03-27 NOTE — H&P (Signed)
[facility]                                                                                    Patient Demographics  [name], is a 66 y.o. female  CSN: [id number]  MRN: [id number]  DOB - [birth year]  Admit Date - 03/27/2012  Outpatient Primary MD for the patient is [name], MD   With History of -  Past Medical History  Diagnosis Date  . Dysmetabolic syndrome X   . Personal history of thrombophlebitis   . Essential hypertension, benign   . Morbid obesity   . Mixed hyperlipidemia   . Nonspecific abnormal electrocardiogram (ECG) (EKG)   . Special screening for malignant neoplasms of other sites   . Depressive disorder, not elsewhere classified   . Bell's palsy   . Need for prophylactic hormone replacement therapy (postmenopausal)   . Adjustment disorder with depressed mood   . Need for prophylactic vaccination and inoculation against influenza   . DVT (deep venous thrombosis), left "early 2000's"    LLE  . Syncope and collapse 12/06/11    "this am; I pass out fairly often"  . Type II or unspecified type diabetes mellitus without mention of complication, not stated as uncontrolled 12/06/11    "not anymore; had lap band OR"  . Neurodegenerative gait disorder   . Headache 05/2011    "pretty often since they put me on Parkinson's medicine"  . Parkinson disease     "they think that's what it is"  . DEMENTIA   . 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 Anxiety   . History of shingles     "as a teen and in my 81's"      Past Surgical History  Procedure Date  . Tonsillectomy and adenoidectomy 1970  . Bladder tack 11/1995  . Septoplasty 08/1998    with antral window   . Knee arthroscopy 12/12/2002    right   . Fracture surgery 02/09/2006    bilateral elbows  . Fracture surgery     right knee  . Fracture surgery     tib plateau  . Breast cyst aspiration 12/1998    bilaterally  . Breast biopsy 05/20/2000   bilaterally  . Tubal ligation 1970's  . Cataract extraction w/ intraocular lens  implant, bilateral   . Laparoscopic gastric banding 2008  . Cardiac catheterization 2004    in for   Chief Complaint  Patient presents with  . Hallucinations     HPI  [name]  is a 66 y.o. female, history of Parkinson's dementia, dyslipidemia, hypertension history of Bell's palsy causing chronic left-sided facial droop, patient actually is alert awake and oriented x3 however she was brought in by her daughter as her baseline hallucinations were getting worse, according to the daughter patient has underlying dementia and doesn't listen it from time to time however from last night her hallucinations according to the daughter have gotten worse, patient is seen date family members and animals in the room also in the ER she had seen and jet flying in her room, patient did have mild  questionable UTI and I was called to admit the patient as she had UTI and could not go to psych facility.  Should otherwise has no subjective complaints whatsoever.    Review of Systems  besides intermittent hallucinations negative review of systems  In addition to the HPI above,  No Fever-chills, No Headache, No changes with Vision or hearing, No problems swallowing food or Liquids, No Chest pain, Cough or Shortness of Breath, No Abdominal pain, No Nausea or Vommitting, Bowel movements are regular, No Blood in stool or Urine, No dysuria, No new skin rashes or bruises, No new joints pains-aches,  No new weakness, tingling, numbness in any extremity, No recent weight gain or loss, No polyuria, polydypsia or polyphagia, No significant Mental Stressors.  A full 10 point Review of Systems was done, except as stated above, all other Review of Systems were negative.   Social History History  Substance Use Topics  . Smoking status: Never Smoker   . Smokeless tobacco: Never Used  . Alcohol Use: No     Family  History Family History  Problem Relation Age of Onset  . COPD Father   . Diabetes Sister   . Vision loss Sister     legally blind  . Asthma Mother   . Heart disease Father      Prior to Admission medications   Medication Sig Start Date End Date Taking? Authorizing Provider  ALPRAZolam Prudy Feeler) 0.5 MG tablet Take 0.5 mg by mouth 2 (two) times daily.   Yes Historical Provider, MD  aspirin 81 MG tablet Take 81 mg by mouth daily.    Yes Historical Provider, MD  Bioflavonoid Products (GRAPE SEED PO) Take 1 tablet by mouth daily.   Yes Historical Provider, MD  buPROPion (WELLBUTRIN XL) 150 MG 24 hr tablet Take 150 mg by mouth See admin instructions. Take 1 tablet every morning for 4 days,then take 2 for 5 days,then take 3 tablets every morning.starting on 03/24/12   Yes Historical Provider, MD  Calcium-Vitamin D-Vitamin K (CALCIUM SOFT CHEWS PO) Take by mouth.   Yes Historical Provider, MD  carbidopa-levodopa (SINEMET IR) 25-100 MG per tablet Take 3 tablets by mouth 3 (three) times daily. 10/19/11  Yes Milas Gain, MD  Cholecalciferol (VITAMIN D3) 1000 UNITS CAPS Take 1 capsule by mouth daily.     Yes Historical Provider, MD  FLUoxetine (PROZAC) 20 MG capsule Take 20 mg by mouth daily.    Yes Historical Provider, MD  hydroxypropyl methylcellulose (ISOPTO TEARS) 2.5 % ophthalmic solution Place 1 drop into both eyes daily.   Yes Historical Provider, MD  losartan-hydrochlorothiazide (HYZAAR) 50-12.5 MG per tablet Take 1 tablet by mouth daily with breakfast.    Yes Historical Provider, MD  Multiple Vitamin (MULTIVITAMIN WITH MINERALS) TABS Take 1 tablet by mouth daily.   Yes Historical Provider, MD  simvastatin (ZOCOR) 80 MG tablet Take 80 mg by mouth at bedtime.   Yes Historical Provider, MD  vitamin B-12 (CYANOCOBALAMIN) 1000 MCG tablet Take 1,000 mcg by mouth daily.     Yes Historical Provider, MD    Allergies  Allergen Reactions  . Codeine Sulfate     REACTION: hallucinations  .  Penicillins Anaphylaxis, Swelling and Rash    Physical Exam  Vitals  Blood pressure 128/54, pulse 86, temperature 98.7 F (37.1 C), temperature source Oral, resp. rate 19, SpO2 96.00%.   1. General frail elderly Caucasian female with left-sided facial droop lying in bed in NAD.  2. Normal affect and insight,  Not Suicidal or Homicidal, Awake Alert, Oriented X 3. However is having intermittent hallucinations.  3. No F.N deficits, ALL C.Nerves Intact, Strength 5/5 all 4 extremities, Sensation intact all 4 extremities, Plantars down going.  4. Ears and Eyes appear Normal, Conjunctivae clear, PERRLA. Moist Oral Mucosa.  5. Supple Neck, No JVD, No cervical lymphadenopathy appriciated, No Carotid Bruits.  6. Symmetrical Chest wall movement, Good air movement bilaterally, CTAB.  7. RRR, No Gallops, Rubs or Murmurs, No Parasternal Heave.  8. Positive Bowel Sounds, Abdomen Soft, Non tender, No organomegaly appriciated,No rebound -guarding or rigidity.  9.  No Cyanosis, Normal Skin Turgor, No Skin Rash or Bruise.  10. Good muscle tone,  joints appear normal , no effusions, Normal ROM.  11. No Palpable Lymph Nodes in Neck or Axillae    Data Review  CBC  Lab 03/27/12 1102 03/23/12 0515  WBC 13.1* 8.8  HGB 13.5 13.7  HCT 38.9 39.8  PLT 269 249  MCV 92.4 92.3  MCH 32.1 31.8  MCHC 34.7 34.4  RDW 12.9 12.7  LYMPHSABS 1.1 1.9  MONOABS 0.9 0.9  EOSABS 0.0 0.2  BASOSABS 0.0 0.0  BANDABS -- --   ------------------------------------------------------------------------------------------------------------------  Chemistries   Lab 03/27/12 1102 03/23/12 0259  NA 135 137  K 3.4* 3.7  CL 97 99  CO2 24 28  GLUCOSE 170* 96  BUN 19 16  CREATININE <0.20* 0.50  CALCIUM 10.4 10.2  MG -- --  AST 14 --  ALT <5 --  ALKPHOS 58 --  BILITOT 0.5 --    Urinalysis    Component Value Date/Time   COLORURINE AMBER* 03/27/2012 1111   APPEARANCEUR TURBID* 03/27/2012 1111   LABSPEC 1.026  03/27/2012 1111   PHURINE 7.0 03/27/2012 1111   GLUCOSEU NEGATIVE 03/27/2012 1111   HGBUR NEGATIVE 03/27/2012 1111   BILIRUBINUR NEGATIVE 03/27/2012 1111   BILIRUBINUR negative 02/07/2012 1501   KETONESUR 15* 03/27/2012 1111   PROTEINUR NEGATIVE 03/27/2012 1111   UROBILINOGEN 0.2 03/27/2012 1111   UROBILINOGEN negative 02/07/2012 1501   NITRITE NEGATIVE 03/27/2012 1111   NITRITE negative 02/07/2012 1501   LEUKOCYTESUR SMALL* 03/27/2012 1111       Imaging results:   Dg Chest 2 View  03/27/2012  *RADIOLOGY REPORT*  Clinical Data: Hallucinations.  Medication change 2 days ago.  CHEST - 2 VIEW  Comparison: 03/23/2012.  Findings:  Cardiopericardial silhouette within normal limits. Mediastinal contours normal. Trachea midline.  No airspace disease or effusion.  Gastric band appears unchanged.  IMPRESSION: No active cardiopulmonary disease.   Original Report Authenticated By: Andreas Newport, M.D.    Dg Chest 2 View  03/23/2012  *RADIOLOGY REPORT*  Clinical Data: Shortness of breath, weakness.  CHEST - 2 VIEW  Comparison: 01/31/2012  Findings: Heart size upper normal.  Mild aortic tortuosity. Mediastinal contours otherwise within normal range. Lungs are essentially clear.  No pleural effusion or pneumothorax. Multilevel degenerative change.  No acute osseous finding.  Gastric band.  IMPRESSION: No radiographic evidence of acute cardiopulmonary process.   Original Report Authenticated By: Waneta Martins, M.D.    Ct Head Wo Contrast  03/27/2012  *RADIOLOGY REPORT*  Clinical Data: Hallucinations.  CT HEAD WITHOUT CONTRAST  Technique:  Contiguous axial images were obtained from the base of the skull through the vertex without contrast.  Comparison: Multiple prior unenhanced cranial CT examinations dating back to 11/11/2007, most recently 01/31/2012.  Findings: Moderate to severe cortical and deep atrophy, unchanged. Mild to moderate changes of small vessel disease of the  white matter diffusely, unchanged.  No mass lesion.   No midline shift. No acute hemorrhage or hematoma.  No extra-axial fluid collections. No evidence of acute infarction.  No significant interval change.  Minimal changes of hyperostosis frontalis interna. Visualized paranasal sinuses, bilateral mastoid air cells, and bilateral middle ear cavities well-aerated.  Bilateral carotid siphon atherosclerosis.  IMPRESSION:  1.  No acute intracranial abnormality. 2.  Stable moderate to severe generalized atrophy and mild to moderate chronic microvascular ischemic changes of the white matter.   Original Report Authenticated By: Arnell Sieving, M.D.       Assessment & Plan   1. Worsening baseline had lacerations in a patient with underlying Parkinson's dementia - could be gradual decline of her disease process however she does have a mild UTI and this could have worsened her baseline hallucinations, we will obtain urine cultures, blood cultures have been ordered, we will continue her on IV Rocephin for 3 doses, she will be seen by psych, low-dose respiratory be started in addition to her baseline psych meds. I have called psych consult on 03-2012.   2. History of Parkinson's dementia-patient still walks without a walker, she has decent quality of life according to the daughter, we'll continue her home medications, will check TSH B12 and folate, will have PT evaluate and see the patient.   3. History of dyslipidemia and hypertension no acute issues home medications to be continued.   4. History of left-sided facial droop due to her spouse he no acute issue.    DVT Prophylaxis Heparin   AM Labs Ordered, also please review Full Orders  Family Communication: Admission, patients condition and plan of care including tests being ordered have been discussed with the patient and daughter who indicate understanding and agree with the plan and Code Status.  Code Status DNR/DNI  Disposition Plan: To be decided  Time spent in minutes : 35  Condition  fair  Leroy Sea M.D on 03/27/2012 at 2:40 PM  Between 7am to 7pm - Pager - [phone]  After 7pm go to [email or web address] - password [email or web address]  And look for the night coverage person covering me after hours  [facility] Office  [phone]

## 2012-03-27 NOTE — ED Notes (Addendum)
Pt. In CT. 

## 2012-03-28 DIAGNOSIS — T7840XA Allergy, unspecified, initial encounter: Secondary | ICD-10-CM

## 2012-03-28 LAB — URINE CULTURE
Colony Count: NO GROWTH
Culture: NO GROWTH

## 2012-03-28 LAB — BASIC METABOLIC PANEL
BUN: 13 mg/dL (ref 6–23)
CO2: 27 mEq/L (ref 19–32)
Calcium: 9.5 mg/dL (ref 8.4–10.5)
Chloride: 105 mEq/L (ref 96–112)
Creatinine, Ser: 0.47 mg/dL — ABNORMAL LOW (ref 0.50–1.10)
GFR calc Af Amer: >90 mL/min (ref >90)
GFR calc non Af Amer: >90 mL/min (ref >90)
Glucose, Bld: 99 mg/dL (ref 70–99)
Potassium: 4.1 mEq/L (ref 3.5–5.1)
Sodium: 139 mEq/L (ref 135–145)

## 2012-03-28 LAB — CBC
HCT: 36.3 % (ref 36.0–46.0)
Hemoglobin: 12.2 g/dL (ref 12.0–15.0)
MCH: 31.2 pg (ref 26.0–34.0)
MCHC: 33.6 g/dL (ref 30.0–36.0)
MCV: 92.8 fL (ref 78.0–100.0)
Platelets: 236 10*3/uL (ref 150–400)
RBC: 3.91 MIL/uL (ref 3.87–5.11)
RDW: 13.2 % (ref 11.5–15.5)
WBC: 9.4 10*3/uL (ref 4.0–10.5)

## 2012-03-28 MED ORDER — ENSURE COMPLETE PO LIQD
237.0000 mL | Freq: Two times a day (BID) | ORAL | Status: DC
  Administered 2012-03-28 – 2012-04-01 (×10): 237 mL via ORAL

## 2012-03-28 NOTE — Progress Notes (Signed)
INITIAL ADULT NUTRITION ASSESSMENT Date: 03/28/2012   Time: 11:15 AM Reason for Assessment: Nutrition risk   INTERVENTION: Ensure Complete BID. Will monitor.   ASSESSMENT: Female 66 y.o.  Dx: Hallucinations  Food/Nutrition Related Hx: Pt with pt who reports poor appetite for about 1 year now with worsening intake in the past 4 weeks. Pt states she typically eats 2 meals/day but has recently being consuming <50% of that amount. Pt reports drinking 3 Ensure/day at home. Pt denies any problems chewing or swallowing however states she has to chew her food until it is very small in order for it to go down. Pt reports 16 pound unintended weight loss in the past 4 weeks however past records show pt's weight has ben relatively stable for the past few months.   Hx:  Past Medical History  Diagnosis Date  . Dysmetabolic syndrome X   . Personal history of thrombophlebitis   . Essential hypertension, benign   . Morbid obesity   . Mixed hyperlipidemia   . Nonspecific abnormal electrocardiogram (ECG) (EKG)   . Special screening for malignant neoplasms of other sites   . Depressive disorder, not elsewhere classified   . Bell's palsy   . Need for prophylactic hormone replacement therapy (postmenopausal)   . Adjustment disorder with depressed mood   . Need for prophylactic vaccination and inoculation against influenza   . DVT (deep venous thrombosis), left "early 2000's"    LLE  . Syncope and collapse 12/06/11    "this am; I pass out fairly often"  . Type II or unspecified type diabetes mellitus without mention of complication, not stated as uncontrolled 12/06/11    "not anymore; had lap band OR"  . Neurodegenerative gait disorder   . Headache 05/2011    "pretty often since they put me on Parkinson's medicine"  . Parkinson disease     "they think that's what it is"  . DEMENTIA   . 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 Anxiety   . History of shingles     "as a  teen and in my 40's"   Related Meds:  Scheduled Meds:   . ALPRAZolam  0.5 mg Oral BID  . aspirin  81 mg Oral Daily  . atorvastatin  40 mg Oral q1800  . buPROPion  450 mg Oral Daily  . carbidopa-levodopa  3 tablet Oral TID  . cefTRIAXone (ROCEPHIN)  IV  1 g Intravenous Once  . cefTRIAXone (ROCEPHIN)  IV  1 g Intravenous Q24H  . FLUoxetine  20 mg Oral Daily  . heparin  5,000 Units Subcutaneous Q8H  . hydrochlorothiazide  12.5 mg Oral Daily   And  . losartan  50 mg Oral Daily  . multivitamin with minerals  1 tablet Oral Daily  . ondansetron (ZOFRAN) IV  4 mg Intravenous Once  . polyvinyl alcohol  1 drop Both Eyes Daily  . risperiDONE  0.25 mg Oral BID  . sodium chloride  1,000 mL Intravenous Once  . vitamin B-12  1,000 mcg Oral Daily  . DISCONTD: sodium chloride   Intravenous STAT  . DISCONTD: aspirin  81 mg Oral Daily  . DISCONTD: hydroxypropyl methylcellulose  1 drop Both Eyes Daily  . DISCONTD: losartan-hydrochlorothiazide  1 tablet Oral Q breakfast   Continuous Infusions:   . 0.9 % NaCl with KCl 40 mEq / L 75 mL/hr at 03/28/12 0821   PRN Meds:.acetaminophen, acetaminophen, albuterol, guaiFENesin-dextromethorphan, HYDROcodone-acetaminophen, ondansetron (ZOFRAN) IV, ondansetron,  senna-docusate  Ht: 5\' 1"  (154.9 cm)  Wt: 146 lb 2.6 oz (66.3 kg)  Ideal Wt: 105 lb % Ideal Wt: 139  Usual Wt: 162 lb per pt report  % Usual Wt: 90  Wt Readings from Last 10 Encounters:  03/28/12 146 lb 2.6 oz (66.3 kg)  03/24/12 144 lb 6.4 oz (65.499 kg)  03/20/12 146 lb (66.225 kg)  03/13/12 146 lb (66.225 kg)  02/07/12 149 lb (67.586 kg)  01/19/12 153 lb (69.4 kg)  12/06/11 151 lb 3.8 oz (68.6 kg)  11/25/11 158 lb (71.668 kg)  09/21/11 159 lb (72.122 kg)  08/10/11 158 lb (71.668 kg)    Body mass index is 27.62 kg/(m^2).   Labs:  CMP     Component Value Date/Time   NA 139 03/28/2012 0335   K 4.1 03/28/2012 0335   CL 105 03/28/2012 0335   CO2 27 03/28/2012 0335   GLUCOSE 99 03/28/2012  0335   BUN 13 03/28/2012 0335   CREATININE 0.47* 03/28/2012 0335   CALCIUM 9.5 03/28/2012 0335   PROT 6.3 03/27/2012 1102   ALBUMIN 3.6 03/27/2012 1102   AST 14 03/27/2012 1102   ALT <5 03/27/2012 1102   ALKPHOS 58 03/27/2012 1102   BILITOT 0.5 03/27/2012 1102   GFRNONAA >90 03/28/2012 0335   GFRAA >90 03/28/2012 0335    Intake/Output Summary (Last 24 hours) at 03/28/12 1121 Last data filed at 03/28/12 0900  Gross per 24 hour  Intake    360 ml  Output    201 ml  Net    159 ml   Last BM - 03/27/12  Diet Order: Cardiac   IVF:    0.9 % NaCl with KCl 40 mEq / L Last Rate: 75 mL/hr at 03/28/12 0821    Estimated Nutritional Needs:   Kcal:1400-1650 Protein:65-80g Fluid:1.4-1.6L  NUTRITION DIAGNOSIS: -Inadequate oral intake (NI-2.1).  Status: Ongoing  RELATED TO: poor appetite  AS EVIDENCE BY: pt statement  MONITORING/EVALUATION(Goals): Pt to consume >75% of meals/supplements.   EDUCATION NEEDS: -No education needs identified at this time   Dietitian #: [phone]  DOCUMENTATION CODES Per approved criteria  -Not Applicable    Marshall Cork 03/28/2012, 11:15 AM

## 2012-03-28 NOTE — ED Provider Notes (Signed)
Medical screening examination/treatment/procedure(s) were performed by non-physician practitioner and as supervising physician I was immediately available for consultation/collaboration.  Tammela Bales R. Gale Hulse, MD 03/28/12 1629 

## 2012-03-28 NOTE — Progress Notes (Addendum)
Clinical Social Work Department BRIEF PSYCHOSOCIAL ASSESSMENT 03/28/2012  Patient:  [name], [name]     Account Number:  [id number]     Admit date:  03/27/2012  Clinical Social Worker: Lia Foyer, [place]  Date/Time:  03/28/2012 04:26 PM  Referred by:  Physician  Date Referred:  03/28/2012 Referred for  Behavioral Health Issues   Other Referral:   Interview type:  Family Other interview type:   and patient    PSYCHOSOCIAL DATA Living Status:  ALONE Admitted from facility:   Level of care:   Primary support name:  Rayford Halsted Primary support relationship to patient:  CHILD, ADULT Degree of support available:   Vested, strong.    CURRENT CONCERNS Current Concerns  Behavioral Health Issues   Other Concerns:    SOCIAL WORK ASSESSMENT / PLAN CSW consulted by MD [id number] health concerns due to hallucinations. CSW met with patient, and stated she was "feeling much better." The patient was drowsy during encounter and requested CSW follow up with her daughter, [name] [phone]. CSW followed up with daughter who stated the patient was doing a lot better, and that she will be d/c'ed home with home health care. The behavioral symptoms seemed to have resolved.CSW provided supportive counseling and encourged questions. Daughter stated she did not have questions and felt comfortable with the d/c plan. CSW signing off.   Assessment/plan status:  Information/Referral to [facility] Other assessment/ plan:   Information/referral to community resources:    PATIENT'S/FAMILY'S RESPONSE TO PLAN OF CARE: Patient and family agreeable to d/c plan and thanked CSW for assistance.  CSW signing off.  Lia Foyer, LCSWA [facility] Clinical Social Worker Contact #: [phone] (PRN)

## 2012-03-28 NOTE — Evaluation (Signed)
Physical Therapy Evaluation Patient Details Name: [name] MRN: [id number] DOB: 06-Dec-1945 Today's Date: 03/28/2012 Time: 4098-1191 PT Time Calculation (min): 28 min  PT Assessment / Plan / Recommendation Clinical Impression  65 you female with hx of bells palsy and parkinson's who is admitted with hallucinations.  She needs continued PT for strengthening and mobility safety.  Recommend HHPT. Pt has all DME    PT Assessment  Patient needs continued PT services    Follow Up Recommendations  Home health PT    Barriers to Discharge        Equipment Recommendations  None recommended by PT    Recommendations for Other Services OT consult   Frequency      Precautions / Restrictions Precautions Precautions: Fall Restrictions Weight Bearing Restrictions: No   Pertinent Vitals/Pain No c/o pain      Mobility  Bed Mobility Bed Mobility: Supine to Sit;Sit to Supine Supine to Sit: 4: Min assist Sit to Supine: 4: Min assist Details for Bed Mobility Assistance: pt with slow movements, some difficulty rolling all the way over onto side and up into sitting Transfers Transfers: Sit to Stand;Stand to Sit Sit to Stand: 4: Min assist Stand to Sit: 4: Min assist Details for Transfer Assistance: pt as difficulty achieving balance in standing with RW.  If she does not lean against bed with back of legs, she will lose her balance and sit down suddenly onto bed Ambulation/Gait Ambulation/Gait Assistance: 4: Min assist Ambulation Distance (Feet): 400 Feet Assistive device: Rolling walker Ambulation/Gait Assistance Details: pt needs assist to direct RW. Gait Pattern: Shuffle;Step-through pattern;Decreased stride length;Decreased step length - right;Decreased step length - left Gait velocity: decreased General Gait Details: pt is able to walk with RW, but is slowed.  She generally has decreased rotation of head and trunk. Stairs: No Wheelchair Mobility Wheelchair Mobility: No      Exercises Other Exercises Other Exercises: pt instruced in basic core and LE strengthening . she was given a handout (copy placed in shadow chart) and able to demonstrate abd sets, glute sets. core activation ex .   PT Diagnosis:    PT Problem List: Decreased strength;Decreased activity tolerance;Decreased mobility PT Treatment Interventions: DME instruction;Functional mobility training;Therapeutic activities;Balance training;Patient/family education   PT Goals Acute Rehab PT Goals PT Goal Formulation: With patient Time For Goal Achievement: 04/11/12 Potential to Achieve Goals: Good Pt will go Supine/Side to Sit: with modified independence PT Goal: Supine/Side to Sit - Progress: Goal set today Pt will go Sit to Supine/Side: with modified independence PT Goal: Sit to Supine/Side - Progress: Goal set today Pt will go Sit to Stand: with modified independence PT Goal: Sit to Stand - Progress: Goal set today Pt will go Stand to Sit: with modified independence PT Goal: Stand to Sit - Progress: Goal set today Pt will Ambulate: >150 feet;with modified independence;with least restrictive assistive device PT Goal: Ambulate - Progress: Goal set today Pt will Go Up / Down Stairs: 1-2 stairs;with modified independence;with least restrictive assistive device PT Goal: Up/Down Stairs - Progress: Goal set today Pt will Perform Home Exercise Program: with supervision, verbal cues required/provided PT Goal: Perform Home Exercise Program - Progress: Goal set today  Visit Information  Last PT Received On: 03/28/12 Assistance Needed: +1    Subjective Data  Subjective: pt reports she finished the Parkinson's programs and [name] outpatient [facility] in July 2013 Patient Stated Goal: to work at [employer] for 2 more years so she will have worked there for 50  years   Prior Functioning  Home Living Lives With: Daughter Available Help at Discharge: Family Type of Home: House Home Access: Stairs to  enter Secretary/administrator of Steps: 2 Entrance Stairs-Rails: Right;Left Home Layout: One level Home Adaptive Equipment: Bedside commode/3-in-1;Walker - rolling Additional Comments: pt reports she bought lots of DME from drug store that was going out of business Prior Function Level of Independence: Independent with assistive device(s) Able to Take Stairs?: Yes Driving: No Vocation: Full time employment Comments: I'll be there 48 years on October 11 Communication Communication: No difficulties (pt with some facial weakness from Bell's palsy)    Cognition  Overall Cognitive Status: Appears within functional limits for tasks assessed/performed Arousal/Alertness: Awake/alert Orientation Level: Appears intact for tasks assessed Behavior During Session: [facility] for tasks performed Cognition - Other Comments: pt reports she hears music--- [name] Christmas music    Extremity/Trunk Assessment Right Lower Extremity Assessment RLE ROM/Strength/Tone: Deficits RLE ROM/Strength/Tone Deficits: ROM appears WFL, but pt has generalized muscle atrophy.  She does have influence of bradykinesia and total joint movement patterns RLE Sensation: WFL - Light Touch RLE Coordination: Deficits RLE Coordination Deficits: limited by effects of parkinsons Left Lower Extremity Assessment LLE ROM/Strength/Tone: Deficits LLE ROM/Strength/Tone Deficits: ROM is WFL, but pt has generalized muscle atrophy with bradykinesia LLE Sensation: WFL - Light Touch LLE Coordination: Deficits LLE Coordination Deficits: total joint movement and bradykinesia Trunk Assessment Trunk Assessment: Other exceptions Trunk Exceptions: large anterior abdomen.    Balance Balance Balance Assessed: Yes Static Sitting Balance Static Sitting - Balance Support: Bilateral upper extremity supported;Feet supported Static Sitting - Level of Assistance: 7: Independent Static Sitting - Comment/# of Minutes: 5 Static Standing Balance Static  Standing - Balance Support: During functional activity;Bilateral upper extremity supported Static Standing - Level of Assistance: 4: Min assist Static Standing - Comment/# of Minutes: 2.minutes.  Pt needs to lean back against bed with lower legs to achieve balance  End of Session PT - End of Session Equipment Utilized During Treatment: Gait belt Activity Tolerance: Patient tolerated treatment well Patient left: in chair Nurse Communication: Mobility status  GP     Bayard Hugger. Manson Passey, Pt [phone] 03/28/2012, 3:16 PM

## 2012-03-28 NOTE — Consult Note (Addendum)
Patient Identification:  [name] Date of Evaluation:  03/28/2012 Reason for Consult: Parkinson's Disease, Visual Hallucinations  Referring Provider:  Dr. Thedore Mins  History of Present Illness:  Pt says she has been diagnosed with Parkinson's Disease about last November 2012.  She has been working for [employer] and likes her job.  She is on medical leave.  She says she began to 'see things' before she came to [facility].  Today, she says the visual hallucinations are gone.  She is feeling much better.  She comments her Parkinson's Disease has not interfered with her ability to work.  She is hoping to work two more years. She only says her mild tremor has made her handwriting 'horrible'.  She says she uses a computer at work.   She is hoping to leave soon.   Past Psychiatric History:she has taken Xanax for some time.  She has home meds of Prozac and Wellbutrin For depression Past Medical History:     Past Medical History  Diagnosis Date  . Dysmetabolic syndrome X   . Personal history of thrombophlebitis   . Essential hypertension, benign   . Morbid obesity   . Mixed hyperlipidemia   . Nonspecific abnormal electrocardiogram (ECG) (EKG)   . Special screening for malignant neoplasms of other sites   . Depressive disorder, not elsewhere classified   . Bell's palsy   . Need for prophylactic hormone replacement therapy (postmenopausal)   . Adjustment disorder with depressed mood   . Need for prophylactic vaccination and inoculation against influenza   . DVT (deep venous thrombosis), left "early 2000's"    LLE  . Syncope and collapse 12/06/11    "this am; I pass out fairly often"  . Type II or unspecified type diabetes mellitus without mention of complication, not stated as uncontrolled 12/06/11    "not anymore; had lap band OR"  . Neurodegenerative gait disorder   . Headache 05/2011    "pretty often since they put me on Parkinson's medicine"  . Parkinson disease     "they think that's what it  is"  . DEMENTIA   . 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 Anxiety   . History of shingles     "as a teen and in my 16's"       Past Surgical History  Procedure Date  . Tonsillectomy and adenoidectomy 1970  . Bladder tack 11/1995  . Septoplasty 08/1998    with antral window   . Knee arthroscopy 12/12/2002    right   . Fracture surgery 02/09/2006    bilateral elbows  . Fracture surgery     right knee  . Fracture surgery     tib plateau  . Breast cyst aspiration 12/1998    bilaterally  . Breast biopsy 05/20/2000    bilaterally  . Tubal ligation 1970's  . Cataract extraction w/ intraocular lens  implant, bilateral   . Laparoscopic gastric banding 2008  . Cardiac catheterization 2004    Allergies:  Allergies  Allergen Reactions  . Codeine Sulfate     REACTION: hallucinations  . Penicillins Anaphylaxis, Swelling and Rash    Current Medications:  Prior to Admission medications   Medication Sig Start Date End Date Taking? Authorizing Provider  ALPRAZolam Prudy Feeler) 0.5 MG tablet Take 0.5 mg by mouth 2 (two) times daily.   Yes Historical Provider, MD  aspirin 81 MG tablet Take 81 mg by mouth daily.  Yes Historical Provider, MD  Bioflavonoid Products (GRAPE SEED PO) Take 1 tablet by mouth daily.   Yes Historical Provider, MD  buPROPion (WELLBUTRIN XL) 150 MG 24 hr tablet Take 150 mg by mouth See admin instructions. Take 1 tablet every morning for 4 days,then take 2 for 5 days,then take 3 tablets every morning.starting on 03/24/12   Yes Historical Provider, MD  Calcium-Vitamin D-Vitamin K (CALCIUM SOFT CHEWS PO) Take by mouth.   Yes Historical Provider, MD  carbidopa-levodopa (SINEMET IR) 25-100 MG per tablet Take 3 tablets by mouth 3 (three) times daily. 10/19/11  Yes Milas Gain, MD  Cholecalciferol (VITAMIN D3) 1000 UNITS CAPS Take 1 capsule by mouth daily.     Yes Historical Provider, MD  FLUoxetine (PROZAC) 20 MG capsule Take 20 mg by mouth  daily.    Yes Historical Provider, MD  hydroxypropyl methylcellulose (ISOPTO TEARS) 2.5 % ophthalmic solution Place 1 drop into both eyes daily.   Yes Historical Provider, MD  losartan-hydrochlorothiazide (HYZAAR) 50-12.5 MG per tablet Take 1 tablet by mouth daily with breakfast.    Yes Historical Provider, MD  Multiple Vitamin (MULTIVITAMIN WITH MINERALS) TABS Take 1 tablet by mouth daily.   Yes Historical Provider, MD  simvastatin (ZOCOR) 80 MG tablet Take 80 mg by mouth at bedtime.   Yes Historical Provider, MD  vitamin B-12 (CYANOCOBALAMIN) 1000 MCG tablet Take 1,000 mcg by mouth daily.     Yes Historical Provider, MD    Social History:    reports that she has never smoked. She has never used smokeless tobacco. She reports that she does not drink alcohol or use illicit drugs.   Family History:    Family History  Problem Relation Age of Onset  . COPD Father   . Diabetes Sister   . Vision loss Sister     legally blind  . Asthma Mother   . 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  AXIS IV economic problems, occupational problems, other psychosocial or environmental problems and problems related to social environment  AXIS V 51-60 moderate symptoms   Assessment/Plan:  Discussed with Dr. Windy Canny, Psych CSW Pt is awake and alert.  She is oriented.  She has good eye contact.  She says she is feeling very well today.   She says her  job is very important to her.  She lives with her daughter and granddaughter.  She thinks they get along fairly well but... Her granddaughter is 63 yo and can be a challenge.  She does not believe there is any problem with her job and says her daughter is submitting an extension of her medical leave request.  She has a good appetite and says she sleeps well She denies any SI/HI  She says she is too 'chicken' to try to kill herself.  She denies smoking, drinking or using drugs.   RECOMMENDATION:  1.  Will suggest change of fluoxetine [potential to be a P450 inhibitor] in the presence of so many other medications.  2.  Suggest reduction of Wellbutrin to lower effective dose 300 mg daily to avoid risk of seizure. 3. Will  See pt in am.  Dellie Piasecki J. Ferol Luz, MD Psychiatrist  03/28/2012 12:43 PM

## 2012-03-28 NOTE — Progress Notes (Signed)
[facility]                                                                                Patient Demographics  [name], is a 66 y.o. female  IHK:[id number]  VFI:[id number]  DOB - 03/27/1946  Admit date - 03/27/2012  Admitting Physician Leroy Sea, MD  Outpatient Primary MD for the patient is [name], MD  LOS - 1   Chief Complaint  Patient presents with  . Hallucinations        Assessment & Plan    1. Worsening baseline Hallucinations in a patient with underlying Parkinson's dementia and some Hallucinations- could be gradual decline of her disease process however she does have a mild UTI and this could have worsened her baseline hallucinations, pending urine & blood cultures, we will continue her on IV Rocephin for 3 doses, she will be seen by psych, low-dose Risperdal has been started in addition to her baseline psych meds. I have called psych consult on 03-2012.      2. History of Parkinson's dementia-patient still walks without a walker, she has decent quality of life according to the daughter, we'll continue her home medications, stable TSH, B12 and folate, will have PT evaluate and see the patient.    Lab Results  Component Value Date   TSH 0.481 03/27/2012   Results for TWYLLA, ARCENEAUX (MRN [id number]) as of 03/28/2012 07:38  Ref. Range 03/27/2012 16:12  Folate No range found >20.0  Vitamin B-12 Latest Range: 211-911 pg/mL 1297 (H)       3. History of dyslipidemia and hypertension no acute issues home medications to be continued.      4. History of left-sided facial droop due to Bells palsy in the past.     Code Status: DNR  Family Communication: Discussed with the patient and her daughter  Disposition Plan: to be decided    Procedures CT Brain   Consults  Psych to see patient   Time Spent in minutes   30   Antibiotics     Anti-infectives     Start     Dose/Rate Route Frequency Ordered Stop   03/28/12 1400    cefTRIAXone (ROCEPHIN) 1 g in dextrose 5 % 50 mL IVPB        1 g 100 mL/hr over 30 Minutes Intravenous Every 24 hours 03/27/12 1548 03/31/12 1359   03/27/12 1330   cefTRIAXone (ROCEPHIN) 1 g in dextrose 5 % 50 mL IVPB        1 g 100 mL/hr over 30 Minutes Intravenous  Once 03/27/12 1329 03/27/12 1439          Scheduled Meds:   . ALPRAZolam  0.5 mg Oral BID  . aspirin  81 mg Oral Daily  . atorvastatin  40 mg Oral q1800  . buPROPion  450 mg Oral Daily  . carbidopa-levodopa  3 tablet Oral TID  . cefTRIAXone (ROCEPHIN)  IV  1 g Intravenous Once  . cefTRIAXone (ROCEPHIN)  IV  1 g Intravenous Q24H  . FLUoxetine  20 mg Oral Daily  . heparin  5,000 Units Subcutaneous Q8H  . hydrochlorothiazide  12.5 mg Oral Daily  And  . losartan  50 mg Oral Daily  . multivitamin with minerals  1 tablet Oral Daily  . ondansetron (ZOFRAN) IV  4 mg Intravenous Once  . polyvinyl alcohol  1 drop Both Eyes Daily  . risperiDONE  0.25 mg Oral BID  . sodium chloride  1,000 mL Intravenous Once  . vitamin B-12  1,000 mcg Oral Daily  . DISCONTD: sodium chloride   Intravenous STAT  . DISCONTD: aspirin  81 mg Oral Daily  . DISCONTD: hydroxypropyl methylcellulose  1 drop Both Eyes Daily  . DISCONTD: losartan-hydrochlorothiazide  1 tablet Oral Q breakfast   Continuous Infusions:   . 0.9 % NaCl with KCl 40 mEq / L 75 mL/hr at 03/27/12 1739   PRN Meds:.acetaminophen, acetaminophen, albuterol, guaiFENesin-dextromethorphan, HYDROcodone-acetaminophen, ondansetron (ZOFRAN) IV, ondansetron, senna-docusate   DVT Prophylaxis Heparin      Susa Raring K M.D on 03/28/2012 at 7:39 AM  Between 7am to 7pm - Pager - [phone]  After 7pm go to [email or web address] - password [email or web address]  And look for the night coverage person covering for me after hours  [facility] Office  [phone]    Subjective:   [name] today has, No headache, No chest pain, No abdominal pain - No Nausea, No new weakness  tingling or numbness, No Cough - SOB.    Objective:   Filed Vitals:   03/27/12 1530 03/27/12 1719 03/27/12 2118 03/28/12 0707  BP: 155/84  146/65 112/54  Pulse: 94  78 65  Temp: 98.5 F (36.9 C)  98.4 F (36.9 C) 98.3 F (36.8 C)  TempSrc: Oral  Oral Oral  Resp: 20  16 16   Height:  5\' 1"  (1.549 m)    Weight:  65.318 kg (144 lb)  66.3 kg (146 lb 2.6 oz)  SpO2: 94%  98% 100%    Wt Readings from Last 3 Encounters:  03/28/12 66.3 kg (146 lb 2.6 oz)  03/24/12 65.499 kg (144 lb 6.4 oz)  03/20/12 66.225 kg (146 lb)     Intake/Output Summary (Last 24 hours) at 03/28/12 0739 Last data filed at 03/27/12 2128  Gross per 24 hour  Intake    120 ml  Output    201 ml  Net    -81 ml    Exam Awake Alert, Oriented X 3, No new F.N deficits, Normal affect [place].AT,PERRAL Supple Neck,No JVD, No cervical lymphadenopathy appriciated.  Symmetrical Chest wall movement, Good air movement bilaterally, CTAB RRR,No Gallops,Rubs or new Murmurs, No Parasternal Heave +ve B.Sounds, Abd Soft, Non tender, No organomegaly appriciated, No rebound - guarding or rigidity. No Cyanosis, Clubbing or edema, No new Rash or bruise     Data Review   Micro Results No results found for this or any previous visit (from the past 240 hour(s)).  Radiology Reports Dg Chest 2 View  03/27/2012  *RADIOLOGY REPORT*  Clinical Data: Hallucinations.  Medication change 2 days ago.  CHEST - 2 VIEW  Comparison: 03/23/2012.  Findings:  Cardiopericardial silhouette within normal limits. Mediastinal contours normal. Trachea midline.  No airspace disease or effusion.  Gastric band appears unchanged.  IMPRESSION: No active cardiopulmonary disease.   Original Report Authenticated By: Andreas Newport, M.D.    Dg Chest 2 View  03/23/2012  *RADIOLOGY REPORT*  Clinical Data: Shortness of breath, weakness.  CHEST - 2 VIEW  Comparison: 01/31/2012  Findings: Heart size upper normal.  Mild aortic tortuosity. Mediastinal contours otherwise  within normal range. Lungs are essentially clear.  No pleural effusion or pneumothorax.  Multilevel degenerative change.  No acute osseous finding.  Gastric band.  IMPRESSION: No radiographic evidence of acute cardiopulmonary process.   Original Report Authenticated By: Waneta Martins, M.D.    Ct Head Wo Contrast  03/27/2012  *RADIOLOGY REPORT*  Clinical Data: Hallucinations.  CT HEAD WITHOUT CONTRAST  Technique:  Contiguous axial images were obtained from the base of the skull through the vertex without contrast.  Comparison: Multiple prior unenhanced cranial CT examinations dating back to 11/11/2007, most recently 01/31/2012.  Findings: Moderate to severe cortical and deep atrophy, unchanged. Mild to moderate changes of small vessel disease of the white matter diffusely, unchanged.  No mass lesion.  No midline shift. No acute hemorrhage or hematoma.  No extra-axial fluid collections. No evidence of acute infarction.  No significant interval change.  Minimal changes of hyperostosis frontalis interna. Visualized paranasal sinuses, bilateral mastoid air cells, and bilateral middle ear cavities well-aerated.  Bilateral carotid siphon atherosclerosis.  IMPRESSION:  1.  No acute intracranial abnormality. 2.  Stable moderate to severe generalized atrophy and mild to moderate chronic microvascular ischemic changes of the white matter.   Original Report Authenticated By: Arnell Sieving, M.D.     CBC  Lab 03/28/12 0335 03/27/12 1102 03/23/12 0515  WBC 9.4 13.1* 8.8  HGB 12.2 13.5 13.7  HCT 36.3 38.9 39.8  PLT 236 269 249  MCV 92.8 92.4 92.3  MCH 31.2 32.1 31.8  MCHC 33.6 34.7 34.4  RDW 13.2 12.9 12.7  LYMPHSABS -- 1.1 1.9  MONOABS -- 0.9 0.9  EOSABS -- 0.0 0.2  BASOSABS -- 0.0 0.0  BANDABS -- -- --    Chemistries   Lab 03/28/12 0335 03/27/12 1102 03/23/12 0259  NA 139 135 137  K 4.1 3.4* 3.7  CL 105 97 99  CO2 27 24 28   GLUCOSE 99 170* 96  BUN 13 19 16   CREATININE 0.47* <0.20* 0.50    CALCIUM 9.5 10.4 10.2  MG -- -- --  AST -- 14 --  ALT -- <5 --  ALKPHOS -- 58 --  BILITOT -- 0.5 --   ------------------------------------------------------------------------------------------------------------------ estimated creatinine clearance is 59.5 ml/min (by C-G formula based on Cr of 0.47). ------------------------------------------------------------------------------------------------------------------ No results found for this basename: HGBA1C:2 in the last 72 hours ------------------------------------------------------------------------------------------------------------------ No results found for this basename: CHOL:2,HDL:2,LDLCALC:2,TRIG:2,CHOLHDL:2,LDLDIRECT:2 in the last 72 hours ------------------------------------------------------------------------------------------------------------------  [facility] 03/27/12 1612  TSH 0.481  T4TOTAL --  T3FREE --  THYROIDAB --   ------------------------------------------------------------------------------------------------------------------  Alvira Philips 03/27/12 1612  VITAMINB12 1297*  FOLATE >20.0  FERRITIN --  TIBC --  IRON --  RETICCTPCT --    Coagulation profile  Lab 03/27/12 1612  INR 0.98  PROTIME --    No results found for this basename: DDIMER:2 in the last 72 hours  Cardiac Enzymes No results found for this basename: CK:3,CKMB:3,TROPONINI:3,MYOGLOBIN:3 in the last 168 hours ------------------------------------------------------------------------------------------------------------------ No components found with this basename: POCBNP:3

## 2012-03-29 DIAGNOSIS — F3289 Other specified depressive episodes: Secondary | ICD-10-CM

## 2012-03-29 DIAGNOSIS — F329 Major depressive disorder, single episode, unspecified: Secondary | ICD-10-CM

## 2012-03-29 MED ORDER — VANCOMYCIN HCL 1000 MG IV SOLR
750.0000 mg | Freq: Two times a day (BID) | INTRAVENOUS | Status: DC
  Administered 2012-03-29 – 2012-03-31 (×6): 750 mg via INTRAVENOUS
  Filled 2012-03-29 (×6): qty 750

## 2012-03-29 MED ORDER — BUPROPION HCL ER (XL) 300 MG PO TB24
300.0000 mg | ORAL_TABLET | Freq: Every day | ORAL | Status: DC
  Administered 2012-03-29 – 2012-04-01 (×4): 300 mg via ORAL
  Filled 2012-03-29 (×4): qty 1

## 2012-03-29 NOTE — Progress Notes (Signed)
ANTIBIOTIC CONSULT NOTE - INITIAL  Pharmacy Consult for Vancomycin Indication: Gm + cocci in clusters (x 1 BC)  Allergies  Allergen Reactions  . Codeine Sulfate     REACTION: hallucinations  . Penicillins Anaphylaxis, Swelling and Rash    Patient Measurements: Height: 5\' 1"  (154.9 cm) Weight: 146 lb 2.6 oz (66.3 kg) IBW/kg (Calculated) : 47.8    Vital Signs: Temp: 97.7 F (36.5 C) (09/03 2145) Temp src: Oral (09/03 2145) BP: 110/46 mmHg (09/03 2145) Pulse Rate: 76  (09/03 2145) Intake/Output from previous day: 09/03 0701 - 09/04 0700 In: 840 [P.O.:840] Out: 1000 [Urine:1000] Intake/Output from this shift:    Labs:  Basename 03/28/12 0335 03/27/12 1102  WBC 9.4 13.1*  HGB 12.2 13.5  PLT 236 269  LABCREA -- --  CREATININE 0.47* <0.20*   Estimated Creatinine Clearance: 59.5 ml/min (by C-G formula based on Cr of 0.47). No results found for this basename: VANCOTROUGH:2,VANCOPEAK:2,VANCORANDOM:2,GENTTROUGH:2,GENTPEAK:2,GENTRANDOM:2,TOBRATROUGH:2,TOBRAPEAK:2,TOBRARND:2,AMIKACINPEAK:2,AMIKACINTROU:2,AMIKACIN:2, in the last 72 hours   Microbiology: Recent Results (from the past 720 hour(s))  URINE CULTURE     Status: Normal   Collection Time   03/27/12 11:11 AM      Component Value Range Status Comment   Specimen Description URINE, CLEAN CATCH   Final    Special Requests NONE   Final    Culture  Setup Time 03/28/2012 03:06   Final    Colony Count NO GROWTH   Final    Culture NO GROWTH   Final    Report Status 03/28/2012 FINAL   Final   CULTURE, BLOOD (ROUTINE X 2)     Status: Normal (Preliminary result)   Collection Time   03/27/12 12:30 PM      Component Value Range Status Comment   Specimen Description BLOOD RIGHT ARM   Final    Special Requests BOTTLES DRAWN AEROBIC AND ANAEROBIC 3CC   Final    Culture  Setup Time 03/27/2012 19:05   Final    Culture     Final    Value: GRAM POSITIVE COCCI IN CLUSTERS     3 Note: Gram Stain Report Called to,Read Back By and  Verified With: MEREDITH MILLS AT 11:15PM 9 13 BY THOMI   Report Status PENDING   Incomplete   CULTURE, BLOOD (ROUTINE X 2)     Status: Normal (Preliminary result)   Collection Time   03/27/12  1:46 PM      Component Value Range Status Comment   Specimen Description BLOOD RIGHT HAND   Final    Special Requests BOTTLES DRAWN AEROBIC AND ANAEROBIC 5CC   Final    Culture  Setup Time 03/27/2012 19:05   Final    Culture     Final    Value:        BLOOD CULTURE RECEIVED NO GROWTH TO DATE CULTURE WILL BE HELD FOR 5 DAYS BEFORE ISSUING A FINAL NEGATIVE REPORT   Report Status PENDING   Incomplete     Medical History: Past Medical History  Diagnosis Date  . Dysmetabolic syndrome X   . Personal history of thrombophlebitis   . Essential hypertension, benign   . Morbid obesity   . Mixed hyperlipidemia   . Nonspecific abnormal electrocardiogram (ECG) (EKG)   . Special screening for malignant neoplasms of other sites   . Depressive disorder, not elsewhere classified   . Bell's palsy   . Need for prophylactic hormone replacement therapy (postmenopausal)   . Adjustment disorder with depressed mood   . Need for prophylactic  vaccination and inoculation against influenza   . DVT (deep venous thrombosis), left "early 2000's"    LLE  . Syncope and collapse 12/06/11    "this am; I pass out fairly often"  . Type II or unspecified type diabetes mellitus without mention of complication, not stated as uncontrolled 12/06/11    "not anymore; had lap band OR"  . Neurodegenerative gait disorder   . Headache 05/2011    "pretty often since they put me on Parkinson's medicine"  . Parkinson disease     "they think that's what it is"  . DEMENTIA   . 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 Anxiety   . History of shingles     "as a teen and in my 55's"    Medications:  Scheduled:    . ALPRAZolam  0.5 mg Oral BID  . aspirin  81 mg Oral Daily  . atorvastatin  40 mg Oral q1800    . buPROPion  450 mg Oral Daily  . carbidopa-levodopa  3 tablet Oral TID  . cefTRIAXone (ROCEPHIN)  IV  1 g Intravenous Q24H  . feeding supplement  237 mL Oral BID BM  . FLUoxetine  20 mg Oral Daily  . heparin  5,000 Units Subcutaneous Q8H  . hydrochlorothiazide  12.5 mg Oral Daily   And  . losartan  50 mg Oral Daily  . multivitamin with minerals  1 tablet Oral Daily  . polyvinyl alcohol  1 drop Both Eyes Daily  . vancomycin  750 mg Intravenous Q12H  . vitamin B-12  1,000 mcg Oral Daily  . DISCONTD: risperiDONE  0.25 mg Oral BID   Infusions:    . 0.9 % NaCl with KCl 40 mEq / L 75 mL/hr at 03/28/12 0981   Assessment: 66 yo female admitted on 9/2 with hallucinations, found to have UTI which was treated with Rocephin.  MD now ordering Vancomycin per Rx for [facility] + Gm+ cocci in clusters.  Goal of Therapy:  Vancomycin trough level 15-20 mcg/ml  Plan:   Vancomycin 750mg  IV q12h.  CrCl~62 (N)  F/U SCr/levels/blood cultures  Lorenza Evangelist 03/29/2012,12:36 AM

## 2012-03-29 NOTE — Progress Notes (Signed)
[facility] PROGRESS NOTE  SHERHONDA GASPAR ZOX:[id number] DOB: [birth year] DOA: 03/27/2012 PCP: [name], MD  Brief narrative: 66 y.o. female, history of Parkinson's dementia, dyslipidemia, hypertension history of Bell's palsy causing chronic left-sided facial droop admitted for worsening hallucinations.  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 - could be due to worsening dementia or UTI - follow up psychiatry consult - continue fluoxetine and Wellbutrin which was decreased 300 mg daily - continue sinemet  Active Problems: Gram positive bacteremia - repeat blood cultures x 2 sets are negative - follow up final results of previous blood cultures that did show gram positive cocci in clusters - continue vanco for now   HYPERLIPIDEMIA, MIXED - continue atorvastatin   ANXIETY DEPRESSION - will follow up on psych recommendations - continue ativan  Hypertension - continue losartan and Hctz   UTI (lower urinary tract infection) - continue Rocephin   Bell's palsy  - stable   Code Status: DNR  Family Communication: no family at bedside Disposition Plan: pending d/c plan  Manson Passey, MD  [facility] Pager [phone]  If 7PM-7AM, please contact night-coverage [email or web address] Password [email or web address] 03/29/2012, 9:43 AM   LOS: 2 days   Consultants:  Psychiatry   Procedures:  None   Antibiotics:  Rocephin   Vancomycin   HPI/Subjective: No acute events overnight  Objective: Filed Vitals:   03/28/12 1342 03/28/12 1457 03/28/12 2145 03/29/12 0612  BP: 110/45  110/46 132/71  Pulse: 73  76 72  Temp: 97.8 F (36.6 C)  97.7 F (36.5 C) 98.1 F (36.7 C)  TempSrc:   Oral Oral  Resp: 18  16 16   Height:      Weight:    67.7 kg (149 lb 4 oz)  SpO2: 100% 98% 98% 99%    Intake/Output Summary (Last 24 hours) at 03/29/12 0943 Last data filed at 03/29/12 0700  Gross per 24 hour    Intake    840 ml  Output   1000 ml  Net   -160 ml    Exam:   General:  Pt is alert, follows commands appropriately, not in acute distress  Cardiovascular: irregular rhythm, rate controlled, S1/S2, no murmurs, no rubs, no gallops  Respiratory: Clear to auscultation bilaterally, no wheezing, no crackles, no rhonchi  Abdomen: Soft, non tender, non distended, bowel sounds present, no guarding  Extremities: No edema, pulses DP and PT palpable bilaterally  Neuro: chronic left sided facial droop  Data Reviewed: Basic Metabolic Panel:  Lab 03/28/12 8295 03/27/12 1102 03/23/12 0259  NA 139 135 137  K 4.1 3.4* 3.7  CL 105 97 99  CO2 27 24 28   GLUCOSE 99 170* 96  BUN 13 19 16   CREATININE 0.47* <0.20* 0.50  CALCIUM 9.5 10.4 10.2   Liver Function Tests:  Lab 03/27/12 1102  AST 14  ALT <5  ALKPHOS 58  BILITOT 0.5  PROT 6.3  ALBUMIN 3.6   CBC:  Lab 03/28/12 0335 03/27/12 1102 03/23/12 0515  WBC 9.4 13.1* 8.8  HGB 12.2 13.5 13.7  HCT 36.3 38.9 39.8  MCV 92.8 92.4 92.3  PLT 236 269 249   CBG:  Lab 03/27/12 1028  GLUCAP 221*    URINE CULTURE     Status: Normal   Collection Time   03/27/12 11:11 AM      Component Value Range Status Comment   Specimen Description URINE  Final    Culture NO GROWTH  Final    Report Status 03/28/2012 FINAL   Final   CULTURE, BLOOD (ROUTINE X 2)     Status: Normal (Preliminary result)   Collection Time   03/27/12 12:30 PM      Component Value Range Status Comment   Culture     Final    Value: GRAM POSITIVE COCCI IN CLUSTERS   Report Status PENDING   Incomplete   CULTURE, BLOOD (ROUTINE X 2)     Status: Normal (Preliminary result)   Collection Time   03/27/12  1:46 PM      Component Value Range Status Comment   Culture     Final    Value:        BLOOD CULTURE RECEIVED NO GROWTH TO DATE    Report Status PENDING   Incomplete      Studies: Dg Chest 2 View 03/27/2012  * IMPRESSION: No active cardiopulmonary disease.    Ct Head Wo  Contrast 03/27/2012  *IMPRESSION:  1.  No acute intracranial abnormality. 2.  Stable moderate to severe generalized atrophy and mild to moderate chronic microvascular ischemic changes of the white matter.     Scheduled Meds:   . ALPRAZolam  0.5 mg Oral BID  . aspirin  81 mg Oral Daily  . atorvastatin  40 mg Oral q1800  . buPROPion  300 mg Oral Daily  . carbidopa-levodopa  3 tablet Oral TID  . cefTRIAXone   1 g Intravenous Q24H  . feeding supplement  237 mL Oral BID BM  . FLUoxetine  20 mg Oral Daily  . heparin  5,000 Units Subcutaneous Q8H  . hydrochlorothiazide  12.5 mg Oral Daily   And  . losartan  50 mg Oral Daily  . multivitamin  1 tablet Oral Daily  . polyvinyl alcohol  1 drop Both Eyes Daily  . vancomycin  750 mg Intravenous Q12H  . vitamin B-12  1,000 mcg Oral Daily   Continuous Infusions:   . 0.9 % NaCl with KCl 40 mEq / L 75 mL/hr at 03/28/12 4098

## 2012-03-29 NOTE — Progress Notes (Signed)
Progress Note following Consultation: Pt is lying in bed.  She is awake, alert and has spontaneous speech.  She has a very mild lip tremor  The lip droop is apparent because she has had Bell;s Palsy twice.  She is oriented to person place situation and date. She knows the president of the [place] is Midwife.  She is given 3 words and recalls 2 of 3 after 5 minutes. 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 She offers and abstract interpretation of a proverb. Her judgment is intact her insight is fairly good but is guarded about her prognosis. She continues to say she wants to work 2 more years so she can say that she has worked a total of 50 years.  She continues to say she is having trouble with her memory and that would meet corroboration by her daughter for specific examples. It is not clear and this evaluation that she has significant memory problems she is able to demonstrate very solid long-term memory as she recounts the number of auto trips she and her husband took before he died they have traveled all over the [place]. 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 As an aside she states that [employer]ico[employer] is one of their biggest customers. She demonstrates her ability to write. She has a sheet of micrographia which is understood as family names but some of the letters are so small that they are not distinct enough for a stranger to read correctly. She is encouraged to try printing. 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   AXIS IV  economic problems, occupational problems, other psychosocial or environmental problems and problems related to social environment   AXIS V  51-60 moderate symptoms      Assessment and plan: This patient has early symptoms of Parkinson's disease in addition to the mild tremor of her lip there is a mild tremor of her hands which she indicates has prevented her from writing clearly. It may indicate that she needs some occupational therapy as part of her rehabilitation. She states that she walked for the first time today and feels encouraged by her stamina. She walked the distance of 2 long hallways and returned to her room. She continues to say that she needs more disability time (which insurance has declined) and concludes this meeting by saying that she really has a poor memory. This is not evident in the context of this evaluation. RECOMMENDATION: 1.  Request PT/OT evaluation to determine placement at the SNF or discharge to home. 2.  Request Psych CSW 2 discuss discharge plans with daughter. 3. No further recommendations until information from daughter is obtained. 4. Will follow patient. Graciana Sessa J. Ferol Luz, MD Psychiatrist  03/29/2012 11:15 PM

## 2012-03-29 NOTE — Progress Notes (Signed)
Physical Therapy Treatment Patient Details Name: KASSEY LAFOREST MRN: [id number] DOB: 1946-07-24 Today's Date: 03/29/2012 Time: 4098-1191 PT Time Calculation (min): 28 min  PT Assessment / Plan / Recommendation Comments on Treatment Session  pt requiring more assist today; unsafe with gait and continues to be a fall risk    Follow Up Recommendations  Home health PT;Other (comment) (would need 24hr assist/supervision at current status)    Barriers to Discharge        Equipment Recommendations  None recommended by PT    Recommendations for Other Services    Frequency Min 3X/week   Plan Discharge plan remains appropriate;Frequency remains appropriate    Precautions / Restrictions Precautions Precautions: Fall   Pertinent Vitals/Pain     Mobility  Bed Mobility Bed Mobility: Sit to Supine Sit to Supine: 4: Min assist;HOB flat Details for Bed Mobility Assistance: assist with LEs Transfers Transfers: Sit to Stand;Stand to Sit Sit to Stand: 2: Max assist;From chair/3-in-1 Stand to Sit: 4: Min assist;3: Mod assist;To bed Details for Transfer Assistance: pt with severe LOB to left upon attempts to stand and once in standing position, requiring max assist to correct; verbal cues for hand placement and safety;improved standing from [facility]; pt states she leans a lot Ambulation/Gait Ambulation/Gait Assistance: 4: Min assist;3: Mod assist Ambulation Distance (Feet): 400 Feet Assistive device: Rolling walker Ambulation/Gait Assistance Details: pt requiring increased assist today; unsafe with RW and requiring increased assist to control, direct RW and to maintain balance;  Gait Pattern: Step-through pattern;Decreased stride length;Shuffle;Festinating General Gait Details: decreased trunk rotation    Exercises     PT Diagnosis:    PT Problem List:   PT Treatment Interventions:     PT Goals Acute Rehab PT Goals Time For Goal Achievement: 04/11/12 Potential to Achieve Goals: Good Pt  will go Supine/Side to Sit: with modified independence Pt will go Sit to Supine/Side: with modified independence PT Goal: Sit to Supine/Side - Progress: Progressing toward goal Pt will go Sit to Stand: with modified independence PT Goal: Sit to Stand - Progress: Not progressing Pt will go Stand to Sit: with modified independence PT Goal: Stand to Sit - Progress: Progressing toward goal Pt will Ambulate: >150 feet;with modified independence;with least restrictive assistive device PT Goal: Ambulate - Progress: Not progressing  Visit Information  Last PT Received On: 03/29/12 Assistance Needed: +1 (+2 would have been helpful for safety; needed 2 hands)    Subjective Data  Subjective: i just asked about you Patient Stated Goal: to work at [employer] for 2 more years so she will have worked there for 50 years   Cognition  Overall Cognitive Status: Appears within functional limits for tasks assessed/performed Arousal/Alertness: Awake/alert Orientation Level: Appears intact for tasks assessed Behavior During Session: [facility] for tasks performed    Balance     End of Session PT - End of Session Equipment Utilized During Treatment: Gait belt Activity Tolerance: Patient tolerated treatment well Patient left: in bed;with call bell/phone within reach   GP     [facility] 03/29/2012, 3:17 PM

## 2012-03-29 NOTE — Progress Notes (Signed)
Chart review.  Psych MD asking CSW to f/u with Pt's daughter to discuss d/c plans and outpt tx.  Spoke with Adela Lank.  Adela Lank stated that Pt will be d/c'd back to her home with [facility] services; Pt lives with Adela Lank and Pt's granddaughter.  Adela Lank stated that Pt was seen by Dr. Alveta Heimlich on Friday and that he recommended that she follow up with psychologist Judithe Modest.  Adela Lank stated that she attempted to make this appt but Dr. Lottie Dawson stated that, since Pt's d/c date is unknown, the family will need to call to schedule when Pt has d/c'd.  Adela Lank stated that Pt has an appt with Dr. Alveta Heimlich Nov. 1st and stated that they intend to keep this appt.  No further psych needs identified.  Psych CSW to sign off.  Providence Crosby, LCSWA Clinical Social Work [phone]

## 2012-03-30 ENCOUNTER — Telehealth (HOSPITAL_COMMUNITY): Payer: Self-pay

## 2012-03-30 LAB — CULTURE, BLOOD (ROUTINE X 2)

## 2012-03-30 NOTE — Progress Notes (Signed)
[facility] PROGRESS NOTE  [name] ZOX:[id number] DOB: [birth year] DOA: 03/27/2012 PCP: [name], MD  Brief narrative: 66 y.o. female, history of Parkinson's dementia, dyslipidemia, hypertension history of Bell's palsy causing chronic left-sided facial droop admitted for worsening hallucinations.   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  - could be due to worsening dementia or UTI  - follow up psychiatry consult - recommendation for SNF versus home; apparently patient did demonstrate long term memory - continue fluoxetine and Wellbutrin which was decreased 300 mg daily  - continue sinemet   Active Problems:  Gram positive bacteremia  - repeat blood cultures x 2 sets are negative  - follow up final results of previous blood cultures that did show gram positive cocci in clusters - pending at this time - continue vanco for now  HYPERLIPIDEMIA, MIXED  - continue atorvastatin  ANXIETY DEPRESSION  - will follow up on psych recommendations  - continue ativan  Hypertension  - will continue losartan and Hctz  UTI (lower urinary tract infection)  - will continue Rocephin  Bell's palsy  - stable   Code Status: DNR  Family Communication: no family at bedside  Disposition Plan: PT evaluation for SNF placement    Manson Passey, MD  [facility] Pager [phone]  If 7PM-7AM, please contact night-coverage [email or web address] Password [email or web address] 03/30/2012, 3:30 PM   LOS: 3 days   HPI/Subjective: No acute events overnight.  Objective: Filed Vitals:   03/29/12 2226 03/30/12 0500 03/30/12 0629 03/30/12 1307  BP: 116/60  116/54 113/66  Pulse: 77  80 77  Temp: 98.4 F (36.9 C)  98 F (36.7 C) 99.5 F (37.5 C)  TempSrc: Oral  Oral Oral  Resp: 18  16 16   Height:      Weight:  67.631 kg (149 lb 1.6 oz) 67.722 kg (149 lb 4.8 oz)   SpO2: 99%  97% 98%    Intake/Output Summary (Last 24  hours) at 03/30/12 1530 Last data filed at 03/30/12 1051  Gross per 24 hour  Intake   1050 ml  Output    450 ml  Net    600 ml    Exam:   General:  Pt is alert, follows commands appropriately, not in acute distress  Cardiovascular: Regular rate and rhythm, S1/S2, no murmurs, no rubs, no gallops  Respiratory: Clear to auscultation bilaterally, no wheezing, no crackles, no rhonchi  Abdomen: Soft, non tender, non distended, bowel sounds present, no guarding  Extremities: No edema, pulses DP and PT palpable bilaterally  Neuro: Grossly nonfocal  Data Reviewed: Basic Metabolic Panel:  Lab 03/28/12 8295 03/27/12 1102  NA 139 135  K 4.1 3.4*  CL 105 97  CO2 27 24  GLUCOSE 99 170*  BUN 13 19  CREATININE 0.47* <0.20*  CALCIUM 9.5 10.4  MG -- --  PHOS -- --   Liver Function Tests:  Lab 03/27/12 1102  AST 14  ALT <5  ALKPHOS 58  BILITOT 0.5  PROT 6.3  ALBUMIN 3.6   No results found for this basename: LIPASE:5,AMYLASE:5 in the last 168 hours No results found for this basename: AMMONIA:5 in the last 168 hours CBC:  Lab 03/28/12 0335 03/27/12 1102  WBC 9.4 13.1*  NEUTROABS -- 11.0*  HGB 12.2 13.5  HCT 36.3 38.9  MCV 92.8 92.4  PLT 236 269   Cardiac Enzymes: No results found for this basename: CKTOTAL:5,CKMB:5,CKMBINDEX:5,TROPONINI:5 in the last 168  hours BNP: No components found with this basename: POCBNP:5 CBG:  Lab 03/27/12 1028  GLUCAP 221*    Recent Results (from the past 240 hour(s))  URINE CULTURE     Status: Normal   Collection Time   03/27/12 11:11 AM      Component Value Range Status Comment   Specimen Description URINE, CLEAN CATCH   Final    Special Requests NONE   Final    Culture  Setup Time 03/28/2012 03:06   Final    Colony Count NO GROWTH   Final    Culture NO GROWTH   Final    Report Status 03/28/2012 FINAL   Final   CULTURE, BLOOD (ROUTINE X 2)     Status: Normal   Collection Time   03/27/12 12:30 PM      Component Value Range Status  Comment   Specimen Description BLOOD RIGHT ARM   Final    Special Requests BOTTLES DRAWN AEROBIC AND ANAEROBIC 3CC   Final    Culture  Setup Time 03/27/2012 19:05   Final    Culture     Final    Value: MICROCOCCUS SPECIES     Note: Standardized susceptibility testing for this organism is not available.     3 Note: Gram Stain Report Called to,Read Back By and Verified With: MEREDITH MILLS AT 11:15PM 9 13 BY THOMI   Report Status 03/30/2012 FINAL   Final   CULTURE, BLOOD (ROUTINE X 2)     Status: Normal (Preliminary result)   Collection Time   03/27/12  1:46 PM      Component Value Range Status Comment   Specimen Description BLOOD RIGHT HAND   Final    Special Requests BOTTLES DRAWN AEROBIC AND ANAEROBIC 5CC   Final    Culture  Setup Time 03/27/2012 19:05   Final    Culture     Final    Value:        BLOOD CULTURE RECEIVED NO GROWTH TO DATE CULTURE WILL BE HELD FOR 5 DAYS BEFORE ISSUING A FINAL NEGATIVE REPORT   Report Status PENDING   Incomplete      Studies: No results found.  Scheduled Meds:   . ALPRAZolam  0.5 mg Oral BID  . aspirin  81 mg Oral Daily  . atorvastatin  40 mg Oral q1800  . buPROPion  300 mg Oral Daily  . carbidopa-levodopa  3 tablet Oral TID  . cefTRIAXone (ROCEPHIN)  IV  1 g Intravenous Q24H  . feeding supplement  237 mL Oral BID BM  . FLUoxetine  20 mg Oral Daily  . heparin  5,000 Units Subcutaneous Q8H  . hydrochlorothiazide  12.5 mg Oral Daily   And  . losartan  50 mg Oral Daily  . multivitamin with minerals  1 tablet Oral Daily  . polyvinyl alcohol  1 drop Both Eyes Daily  . vancomycin  750 mg Intravenous Q12H  . vitamin B-12  1,000 mcg Oral Daily   Continuous Infusions:

## 2012-03-30 NOTE — Progress Notes (Signed)
PT Cancellation Note  Treatment cancelled today due to patient's refusal to participate.  Pt reports she did not sleep well last night and barely made ir from the chair to the bed earlier today.  She defers PT today Donnetta Hail 03/30/2012, 2:13 PM

## 2012-03-30 NOTE — Telephone Encounter (Signed)
3:12pm 03/30/12 pt's daughter [name] called stating that her mother is in the hospital [facility] because she started halluinating - the doctor increase wellbution 450mg  then drop 300mg  -pt's mother started halluinating again - pt's daughter would like a call./sh

## 2012-03-31 NOTE — Progress Notes (Signed)
Physical Therapy Treatment Patient Details Name: [name] MRN: [id number] DOB: 06-Aug-1945 Today's Date: 03/31/2012 Time: 4098-1191 PT Time Calculation (min): 28 min  PT Assessment / Plan / Recommendation Comments on Treatment Session  pt states she feels overall weaker than she did 2 days ago.  She does not feel that she can be alone during the day.  Pt does cooperate well and perform well with PT.  Will update d/c plan to short term SNF to regain fucntional independence for D/C to home alone during the day    Follow Up Recommendations  Skilled nursing facility (would need 24hr assist/supervision at current status)    Barriers to Discharge        Equipment Recommendations  Defer to next venue    Recommendations for Other Services    Frequency Min 3X/week   Plan Frequency remains appropriate;Discharge plan needs to be updated    Precautions / Restrictions Precautions Precautions: Fall   Pertinent Vitals/Pain No c/o pain    Mobility  Bed Mobility Sit to Supine: 4: Min assist;HOB flat Details for Bed Mobility Assistance: assist with LEs Transfers Transfers: Sit to Stand;Stand to Sit Sit to Stand: From chair/3-in-1;3: Mod assist;From toilet Stand to Sit: 4: Min assist;To bed;To toilet Details for Transfer Assistance: pt needs extra time to achieve balance in standing with RW Ambulation/Gait Ambulation/Gait Assistance: 4: Min assist Ambulation Distance (Feet): 400 Feet Assistive device: Rolling walker Ambulation/Gait Assistance Details: pt also went to [place] prior to walking .  She needed assist to apply brief .  Prior to my entering room, she had been incontinent in brief. "I fell asleep and I wet my pants" Gait Pattern: Step-through pattern;Decreased stride length;Shuffle;Festinating;Decreased step length - right;Decreased step length - left Gait velocity: decreased.  Worked on changing speeds today with sudden stops and control of balance.  She was able to increase  speed somewhat, but overall speed remained decreased General Gait Details: decreased trunk rotation Stairs: No Wheelchair Mobility Wheelchair Mobility: No    Exercises     PT Diagnosis:    PT Problem List:   PT Treatment Interventions:     PT Goals Acute Rehab PT Goals Time For Goal Achievement: 04/11/12 Potential to Achieve Goals: Good Pt will go Sit to Supine/Side: with modified independence PT Goal: Sit to Supine/Side - Progress: Progressing toward goal Pt will go Sit to Stand: with modified independence PT Goal: Sit to Stand - Progress: Progressing toward goal Pt will go Stand to Sit: with modified independence PT Goal: Stand to Sit - Progress: Progressing toward goal Pt will Ambulate: >150 feet;with modified independence;with least restrictive assistive device PT Goal: Ambulate - Progress: Progressing toward goal  Visit Information  Last PT Received On: 03/31/12 Assistance Needed: +1    Subjective Data  Subjective: There is no way I can go home by myself right now Patient Stated Goal: to get stronger so that she can go home by herself during the day   Cognition  Overall Cognitive Status: Appears within functional limits for tasks assessed/performed Arousal/Alertness: Awake/alert Orientation Level: Appears intact for tasks assessed Behavior During Session: [facility] for tasks performed    Balance  Balance Balance Assessed: Yes Static Sitting Balance Static Sitting - Balance Support: Bilateral upper extremity supported;Feet supported Static Sitting - Level of Assistance: 7: Independent Static Sitting - Comment/# of Minutes: 3 worked on  trunk extension Cytogeneticist Standing - Balance Support: During functional activity;Bilateral upper extremity supported Static Standing - Level of Assistance: 5: Stand  by assistance Static Standing - Comment/# of Minutes: 3  needs encouragement to extend trunk for better core stability  End of Session PT - End of  Session Equipment Utilized During Treatment: Gait belt Activity Tolerance: Patient tolerated treatment well Patient left: in bed;with call bell/phone within reach Nurse Communication: Mobility status   GP     Rosey Bath K. Manson Passey, [place] [phone] 03/31/2012, 10:09 AM

## 2012-03-31 NOTE — Progress Notes (Signed)
Clinical Social Work Department CLINICAL SOCIAL WORK PLACEMENT NOTE 03/31/2012  Patient:  [name], [name]  Account Number:  [id number] Admit date:  03/27/2012  Clinical Social Worker:  Skip Mayer  Date/time:  03/31/2012 02:22 PM  Clinical Social Work is seeking post-discharge placement for this patient at the following level of care:   SKILLED NURSING   (*CSW will update this form in Epic as items are completed)   03/31/2012  Patient/family provided with [facility] Department of Clinical Social Work's list of facilities offering this level of care within the geographic area requested by the patient (or if unable, by the patient's family).  03/31/2012  Patient/family informed of their freedom to choose among providers that offer the needed level of care, that participate in Medicare, Medicaid or managed care program needed by the patient, have an available bed and are willing to accept the patient.  03/31/2012  Patient/family informed of [facility]' ownership interest in [facility], as well as of the fact that they are under no obligation to receive care at this facility.  PASARR submitted to EDS on 03/31/2012 PASARR number received from E[employer] on 03/31/2012  FL2 transmitted to all facilities in geographic area requested by pt/family on  03/31/2012 FL2 transmitted to all facilities within larger geographic area on   Patient informed that his/her managed care company has contracts with or will negotiate with  certain facilities, including the following:     Patient/family informed of bed offers received:   Patient chooses bed at  Physician recommends and patient chooses bed at    Patient to be transferred to  on   Patient to be transferred to facility by   The following physician request were entered in Epic:   Additional Comments:

## 2012-03-31 NOTE — Progress Notes (Signed)
CSW spoke with pt's dtr, Beecher Mcardle [phone], re: PT recommendation for HHPT with 24 hour supervision.  Pt's dtr unable to provide 24 hour supervision due to work schedule. CSW explained placement process and answered questions.  CSW to complete FL2 and initiate SNF search. Will f/u with offers to pt's dtr/HCPOA.  Dellie Burns, MSW, [place] [phone] (coverage)

## 2012-03-31 NOTE — Progress Notes (Signed)
[facility] PROGRESS NOTE  [name] RUE:[id number] DOB: 10/11/1945 DOA: 03/27/2012 PCP: [name], MD  Brief narrative: 66 y.o. female, history of Parkinson's dementia, dyslipidemia, hypertension history of Bell's palsy causing chronic left-sided facial droop admitted for worsening hallucinations.   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  - Possibly due to worsening dementia or urinary tract infection - follow up psychiatry consult - recommendation for SNF which is with family and patient wants - continue fluoxetine and Wellbutrin which was decreased 300 mg daily  - continue sinemet per home regimen  Active Problems:  Gram positive bacteremia  - repeat blood cultures x 2 sets are negative  - follow up final results of previous blood cultures show Micrococcus species which is not true pathogen - We will discontinue antibiotics  HYPERLIPIDEMIA, MIXED  - continue atorvastatin   ANXIETY DEPRESSION  - continue ativan   Hypertension  - will continue losartan and Hctz   UTI (lower urinary tract infection)  - will continue Rocephin   Bell's palsy  - stable   Code Status: DNR  Family Communication: no family at bedside  Disposition Plan: Plan for a skilled nursing facility placement   Manson Passey, MD  [facility] Pager [phone]  If 7PM-7AM, please contact night-coverage [email or web address] Password [email or web address] 03/31/2012, 1:51 PM   LOS: 4 days   HPI/Subjective: No acute events overnight.  Objective: Filed Vitals:   03/30/12 1307 03/30/12 2053 03/31/12 0220 03/31/12 0506  BP: 113/66 108/47  121/61  Pulse: 77 86  72  Temp: 99.5 F (37.5 C) 97.6 F (36.4 C)  98.3 F (36.8 C)  TempSrc: Oral Oral  Oral  Resp: 16 18  18   Height:      Weight:   67.586 kg (149 lb)   SpO2: 98% 99%  100%    Intake/Output Summary (Last 24 hours) at 03/31/12 1351 Last data filed at 03/31/12  0600  Gross per 24 hour  Intake    300 ml  Output      0 ml  Net    300 ml    Exam:   General:  Pt is alert, follows commands appropriately, not in acute distress  Cardiovascular: Regular rate and rhythm, S1/S2, no murmurs, no rubs, no gallops  Respiratory: Clear to auscultation bilaterally, no wheezing, no crackles, no rhonchi  Abdomen: Soft, non tender, non distended, bowel sounds present, no guarding  Extremities: No edema, pulses DP and PT palpable bilaterally  Neuro: Grossly nonfocal  Data Reviewed: Basic Metabolic Panel:  Lab 03/28/12 2130 03/27/12 1102  NA 139 135  K 4.1 3.4*  CL 105 97  CO2 27 24  GLUCOSE 99 170*  BUN 13 19  CREATININE 0.47* <0.20*  CALCIUM 9.5 10.4   Liver Function Tests:  Lab 03/27/12 1102  AST 14  ALT <5  ALKPHOS 58  BILITOT 0.5  PROT 6.3  ALBUMIN 3.6   CBC:  Lab 03/28/12 0335 03/27/12 1102  WBC 9.4 13.1*  HGB 12.2 13.5  HCT 36.3 38.9  MCV 92.8 92.4  PLT 236 269   CBG:  Lab 03/27/12 1028  GLUCAP 221*    Recent Results (from the past 240 hour(s))  URINE CULTURE     Status: Normal   Collection Time   03/27/12 11:11 AM      Component Value Range Status Comment   Specimen Description URINE, CLEAN CATCH   Final    Special Requests  NONE   Final    Culture  Setup Time 03/28/2012 03:06   Final    Colony Count NO GROWTH   Final    Culture NO GROWTH   Final    Report Status 03/28/2012 FINAL   Final   CULTURE, BLOOD (ROUTINE X 2)     Status: Normal   Collection Time   03/27/12 12:30 PM      Component Value Range Status Comment   Specimen Description BLOOD RIGHT ARM   Final    Special Requests BOTTLES DRAWN AEROBIC AND ANAEROBIC 3CC   Final    Culture  Setup Time 03/27/2012 19:05   Final    Culture     Final    Value: MICROCOCCUS SPECIES     Note: Standardized susceptibility testing for this organism is not available.     3 Note: Gram Stain Report Called to,Read Back By and Verified With: MEREDITH MILLS AT 11:15PM 9 13 BY  THOMI   Report Status 03/30/2012 FINAL   Final   CULTURE, BLOOD (ROUTINE X 2)     Status: Normal (Preliminary result)   Collection Time   03/27/12  1:46 PM      Component Value Range Status Comment   Specimen Description BLOOD RIGHT HAND   Final    Special Requests BOTTLES DRAWN AEROBIC AND ANAEROBIC 5CC   Final    Culture  Setup Time 03/27/2012 19:05   Final    Culture     Final    Value:        BLOOD CULTURE RECEIVED NO GROWTH TO DATE CULTURE WILL BE HELD FOR 5 DAYS BEFORE ISSUING A FINAL NEGATIVE REPORT   Report Status PENDING   Incomplete      Studies: No results found.  Scheduled Meds:   . ALPRAZolam  0.5 mg Oral BID  . aspirin  81 mg Oral Daily  . atorvastatin  40 mg Oral q1800  . buPROPion  300 mg Oral Daily  . carbidopa-levodopa  3 tablet Oral TID  . cefTRIAXone (ROCEPHIN)  IV  1 g Intravenous Q24H  . feeding supplement  237 mL Oral BID BM  . FLUoxetine  20 mg Oral Daily  . heparin  5,000 Units Subcutaneous Q8H  . hydrochlorothiazide  12.5 mg Oral Daily   And  . losartan  50 mg Oral Daily  . multivitamin with minerals  1 tablet Oral Daily  . polyvinyl alcohol  1 drop Both Eyes Daily  . vancomycin  750 mg Intravenous Q12H  . vitamin B-12  1,000 mcg Oral Daily

## 2012-03-31 NOTE — Plan of Care (Signed)
Problem: Phase II Progression Outcomes Goal: Discharge plan established Outcome: Completed/Met Date Met:  03/31/12 Plan is now for short-term rehabilitation.  Problem: Phase III Progression Outcomes Goal: Pain controlled on oral analgesia Outcome: Not Applicable Date Met:  03/31/12 No c/o pain.

## 2012-04-01 MED ORDER — BUPROPION HCL ER (XL) 150 MG PO TB24: 300.0000 mg | ORAL_TABLET | Freq: Every day | ORAL | Status: DC

## 2012-04-01 MED ORDER — ACETAMINOPHEN 325 MG PO TABS: 650.0000 mg | ORAL_TABLET | Freq: Four times a day (QID) | ORAL | Status: AC | PRN

## 2012-04-01 MED ORDER — ALBUTEROL SULFATE (5 MG/ML) 0.5% IN NEBU: 2.5000 mg | INHALATION_SOLUTION | Freq: Four times a day (QID) | RESPIRATORY_TRACT | Status: DC | PRN

## 2012-04-01 MED ORDER — ENSURE COMPLETE PO LIQD: 237.0000 mL | Freq: Two times a day (BID) | ORAL | Status: DC

## 2012-04-01 NOTE — Progress Notes (Signed)
CSW faxed d/c summary and AVS to [facility] for review.  CSW will f/u for d/c approval to facility.   Leron Croak, LCSWA [employer] Coverage [phone]

## 2012-04-01 NOTE — Progress Notes (Signed)
[facility] PROGRESS [name]  SHARESA [name] ZOX:[id number] DOB: 05/24/1946 DOA: 03/27/2012 PCP: [name], MD  Brief narrative: 66 y.o. female, history of Parkinson's dementia, dyslipidemia, hypertension history of Bell's palsy causing chronic left-sided facial droop admitted for worsening hallucinations.   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 or UTI - per psych recommendation for SNF which is with family and patient wants  - continue fluoxetine and Wellbutrin which was decreased 300 mg daily  - continue sinemet per home regimen   Active Problems:  Gram positive bacteremia  - repeat blood cultures x 2 sets are negative  - follow up final results of previous blood cultures show Micrococcus species which is not true pathogen  - antibiotics discontinued HYPERLIPIDEMIA, MIXED  - will continue atorvastatin  ANXIETY DEPRESSION  - will continue ativan  Hypertension  - will continue losartan and Hctz  UTI (lower urinary tract infection)  - will continue Rocephin  Bell's palsy  - stable  Code Status: DNR  Family Communication: no family at bedside  Disposition Plan: Plan for a skilled nursing facility placement    Martha Passey, MD  [facility] Pager [phone]  If 7PM-7AM, please contact night-coverage [email or web address] Password [facility] 04/01/2012, 7:58 AM   LOS: 5 days   HPI/Subjective: No acute events overnight.  Objective: Filed Vitals:   03/31/12 0220 03/31/12 0506 03/31/12 1404 04/01/12 0437  BP:  121/61 128/70 125/72  Pulse:  72 88 74  Temp:  98.3 F (36.8 C) 97.5 F (36.4 C) 97.6 F (36.4 C)  TempSrc:  Oral Oral Oral  Resp:  18 20 17   Height:      Weight: 67.586 kg (149 lb)     SpO2:  100% 100% 100%   No intake or output data in the 24 hours ending 04/01/12 0758  Exam:   General:  Pt is alert, follows commands appropriately, not in acute distress  Cardiovascular:  Regular rate and rhythm, S1/S2, no murmurs, no rubs, no gallops  Respiratory: Clear to auscultation bilaterally, no wheezing, no crackles, no rhonchi  Abdomen: Soft, non tender, non distended, bowel sounds present, no guarding  Extremities: No edema, pulses DP and PT palpable bilaterally  Neuro: Grossly nonfocal  Data Reviewed: Basic Metabolic Panel:  Lab 03/28/12 8295 03/27/12 1102  NA 139 135  K 4.1 3.4*  CL 105 97  CO2 27 24  GLUCOSE 99 170*  BUN 13 19  CREATININE 0.47* <0.20*  CALCIUM 9.5 10.4  MG -- --  PHOS -- --   Liver Function Tests:  Lab 03/27/12 1102  AST 14  ALT <5  ALKPHOS 58  BILITOT 0.5  PROT 6.3  ALBUMIN 3.6   No results found for this basename: LIPASE:5,AMYLASE:5 in the last 168 hours No results found for this basename: AMMONIA:5 in the last 168 hours CBC:  Lab 03/28/12 0335 03/27/12 1102  WBC 9.4 13.1*  NEUTROABS -- 11.0*  HGB 12.2 13.5  HCT 36.3 38.9  MCV 92.8 92.4  PLT 236 269   Cardiac Enzymes: No results found for this basename: CKTOTAL:5,CKMB:5,CKMBINDEX:5,TROPONINI:5 in the last 168 hours BNP: No components found with this basename: POCBNP:5 CBG:  Lab 03/27/12 1028  GLUCAP 221*    Recent Results (from the past 240 hour(s))  URINE CULTURE     Status: Normal   Collection Time   03/27/12 11:11 AM      Component Value Range Status Comment   Specimen Description URINE,  CLEAN CATCH   Final    Special Requests NONE   Final    Culture  Setup Time 03/28/2012 03:06   Final    Colony Count NO GROWTH   Final    Culture NO GROWTH   Final    Report Status 03/28/2012 FINAL   Final   CULTURE, BLOOD (ROUTINE X 2)     Status: Normal   Collection Time   03/27/12 12:30 PM      Component Value Range Status Comment   Specimen Description BLOOD RIGHT ARM   Final    Special Requests BOTTLES DRAWN AEROBIC AND ANAEROBIC 3CC   Final    Culture  Setup Time 03/27/2012 19:05   Final    Culture     Final    Value: MICROCOCCUS SPECIES     [name]:  Standardized susceptibility testing for this organism is not available.     3 [name]: Gram Stain Report Called to,Read Back By and Verified With: [name] AT 11:15PM 9 13 BY [name]   Report Status 03/30/2012 FINAL   Final   CULTURE, BLOOD (ROUTINE X 2)     Status: Normal (Preliminary result)   Collection Time   03/27/12  1:46 PM      Component Value Range Status Comment   Specimen Description BLOOD RIGHT HAND   Final    Special Requests BOTTLES DRAWN AEROBIC AND ANAEROBIC 5CC   Final    Culture  Setup Time 03/27/2012 19:05   Final    Culture     Final    Value:        BLOOD CULTURE RECEIVED NO GROWTH TO DATE CULTURE WILL BE HELD FOR 5 DAYS BEFORE ISSUING A FINAL NEGATIVE REPORT   Report Status PENDING   Incomplete      Studies: No results found.  Scheduled Meds:   . ALPRAZolam  0.5 mg Oral BID  . aspirin  81 mg Oral Daily  . atorvastatin  40 mg Oral q1800  . buPROPion  300 mg Oral Daily  . carbidopa-levodopa  3 tablet Oral TID  . feeding supplement  237 mL Oral BID BM  . FLUoxetine  20 mg Oral Daily  . heparin  5,000 Units Subcutaneous Q8H  . hydrochlorothiazide  12.5 mg Oral Daily   And  . losartan  50 mg Oral Daily  . multivitamin with minerals  1 tablet Oral Daily  . polyvinyl alcohol  1 drop Both Eyes Daily  . vitamin B-12  1,000 mcg Oral Daily  . DISCONTD: vancomycin  750 mg Intravenous Q12H   Continuous Infusions:

## 2012-04-01 NOTE — Progress Notes (Signed)
Pt to be d/c today to [facility].  Pt and family agreeable. Confirmed plans with facility.  Plan transfer via EMS.   Muhammadali Ries, LCSWA [place] Weekend Coverage [phone]   

## 2012-04-01 NOTE — Discharge Summary (Signed)
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 female, history of Parkinson's dementia, dyslipidemia, hypertension history of Bell's palsy causing chronic left-sided facial droop admitted for worsening hallucinations.   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 or UTI  - per psych recommendation for SNF which is with family and patient wants  - continue fluoxetine and Wellbutrin  - Wellbutrin changed to lower dose of 300 mg daily but fluoxetine same dosage as before the admission - continue sinemet per home regimen   Active Problems:  Gram positive bacteremia  - repeat blood cultures x 2 sets are negative  - follow up final results of previous blood cultures show Micrococcus species which is not true pathogen  - antibiotics discontinued  HYPERLIPIDEMIA, MIXED  - will continue atorvastatin per home regimen ANXIETY DEPRESSION  - will continue ativan per home medication regimen Hypertension  - will continue home medication dose UTI (lower urinary tract infection)  - does not need antibiotics on discharge Bell's palsy  - stable   Code Status: DNR  Family Communication: no family at bedside  Disposition Plan: SNF today   Discharge Exam: Filed Vitals:   04/01/12 1020  BP: 129/69  Pulse: 83  Temp:   Resp:    Filed Vitals:   03/31/12 0506 03/31/12 1404 04/01/12 0437 04/01/12 1020  BP: 121/61 128/70 125/72 129/69  Pulse: 72 88 74 83    Temp: 98.3 F (36.8 C) 97.5 F (36.4 C) 97.6 F (36.4 C)   TempSrc: Oral Oral Oral   Resp: 18 20 17    Height:      Weight:      SpO2: 100% 100% 100%     General: Pt is alert, follows commands appropriately, not in acute distress Cardiovascular: Regular rate and rhythm, S1/S2 +, no murmurs, no rubs, no gallops Respiratory: Clear to auscultation bilaterally, no wheezing, no crackles, no rhonchi Abdominal: Soft, non tender, non distended, bowel sounds +, no guarding Extremities: no edema, no cyanosis, pulses palpable bilaterally DP and PT Neuro: Grossly nonfocal  Discharge Instructions  Discharge Orders    Future Appointments: Provider: Department: Dept Phone: Center:   04/10/2012 9:45 AM Octaviano Batty Tat, DO [facility] [phone] None     Future Orders Please Complete By Expires   Diet - low sodium heart healthy      Increase activity slowly      Call MD for:  persistant nausea and vomiting      Call MD for:  severe uncontrolled pain      Call MD for:  difficulty breathing, headache or visual disturbances      Call MD for:  persistant dizziness or light-headedness        Medication List  As of 04/01/2012 10:55 AM   TAKE these medications         acetaminophen 325 MG tablet   Commonly known as: TYLENOL   Take 2 tablets (650 mg total) by mouth every 6 (six) hours as needed for pain or fever (or Fever >/= 101).  albuterol (5 MG/ML) 0.5% nebulizer solution   Commonly known as: PROVENTIL   Take 0.5 mLs (2.5 mg total) by nebulization every 6 (six) hours as needed for wheezing or shortness of breath.      ALPRAZolam 0.5 MG tablet   Commonly known as: XANAX   Take 0.5 mg by mouth 2 (two) times daily.      aspirin 81 MG tablet   Take 81 mg by mouth daily.      buPROPion 150 MG 24 hr tablet   Commonly known as: WELLBUTRIN XL   Take 2 tablets (300 mg total) by mouth daily. Take 1 tablet every morning for 4 days,then take 2 for 5 days,then take 3 tablets every  morning.starting on 03/24/12      CALCIUM SOFT CHEWS PO   Take by mouth.      carbidopa-levodopa 25-100 MG per tablet   Commonly known as: SINEMET IR   Take 3 tablets by mouth 3 (three) times daily.      feeding supplement Liqd   Take 237 mLs by mouth 2 (two) times daily between meals.      FLUoxetine 20 MG capsule   Commonly known as: PROZAC   Take 20 mg by mouth daily.      GRAPE SEED PO   Take 1 tablet by mouth daily.      hydroxypropyl methylcellulose 2.5 % ophthalmic solution   Commonly known as: ISOPTO TEARS   Place 1 drop into both eyes daily.      losartan-hydrochlorothiazide 50-12.5 MG per tablet   Commonly known as: HYZAAR   Take 1 tablet by mouth daily with breakfast.      multivitamin with minerals Tabs   Take 1 tablet by mouth daily.      simvastatin 80 MG tablet   Commonly known as: ZOCOR   Take 80 mg by mouth at bedtime.      vitamin B-12 1000 MCG tablet   Commonly known as: CYANOCOBALAMIN   Take 1,000 mcg by mouth daily.      Vitamin D3 1000 UNITS Caps   Take 1 capsule by mouth daily.           Follow-up Information    Follow up with [name], MD. (Dr. Alveta Heimlich on November 1st.)    Contact information:   [address] [place] [address], [place] [address] [phone]           The results of significant diagnostics from this hospitalization (including imaging, microbiology, ancillary and laboratory) are listed below for reference.    Significant Diagnostic Studies: Dg Chest 2 View  03/27/2012  *RADIOLOGY REPORT*  Clinical Data: Hallucinations.  Medication change 2 days ago.  CHEST - 2 VIEW  Comparison: 03/23/2012.  Findings:  Cardiopericardial silhouette within normal limits. Mediastinal contours normal. Trachea midline.  No airspace disease or effusion.  Gastric band appears unchanged.  IMPRESSION: No active cardiopulmonary disease.   Original Report Authenticated By: Andreas Newport, M.D.    Dg Chest 2  View  03/23/2012  *RADIOLOGY REPORT*  Clinical Data: Shortness of breath, weakness.  CHEST - 2 VIEW  Comparison: 01/31/2012  Findings: Heart size upper normal.  Mild aortic tortuosity. Mediastinal contours otherwise within normal range. Lungs are essentially clear.  No pleural effusion or pneumothorax. Multilevel degenerative change.  No acute osseous finding.  Gastric band.  IMPRESSION: No radiographic evidence of acute cardiopulmonary process.   Original Report Authenticated By: Waneta Martins, M.D.  Ct Head Wo Contrast  03/27/2012  *RADIOLOGY REPORT*  Clinical Data: Hallucinations.  CT HEAD WITHOUT CONTRAST  Technique:  Contiguous axial images were obtained from the base of the skull through the vertex without contrast.  Comparison: Multiple prior unenhanced cranial CT examinations dating back to 11/11/2007, most recently 01/31/2012.  Findings: Moderate to severe cortical and deep atrophy, unchanged. Mild to moderate changes of small vessel disease of the white matter diffusely, unchanged.  No mass lesion.  No midline shift. No acute hemorrhage or hematoma.  No extra-axial fluid collections. No evidence of acute infarction.  No significant interval change.  Minimal changes of hyperostosis frontalis interna. Visualized paranasal sinuses, bilateral mastoid air cells, and bilateral middle ear cavities well-aerated.  Bilateral carotid siphon atherosclerosis.  IMPRESSION:  1.  No acute intracranial abnormality. 2.  Stable moderate to severe generalized atrophy and mild to moderate chronic microvascular ischemic changes of the white matter.   Original Report Authenticated By: Arnell Sieving, M.D.     Microbiology: Recent Results (from the past 240 hour(s))  URINE CULTURE     Status: Normal   Collection Time   03/27/12 11:11 AM      Component Value Range Status Comment   Specimen Description URINE, CLEAN CATCH   Final    Special Requests NONE   Final    Culture  Setup Time 03/28/2012 03:06   Final     Colony Count NO GROWTH   Final    Culture NO GROWTH   Final    Report Status 03/28/2012 FINAL   Final   CULTURE, BLOOD (ROUTINE X 2)     Status: Normal   Collection Time   03/27/12 12:30 PM      Component Value Range Status Comment   Specimen Description BLOOD RIGHT ARM   Final    Special Requests BOTTLES DRAWN AEROBIC AND ANAEROBIC 3CC   Final    Culture  Setup Time 03/27/2012 19:05   Final    Culture     Final    Value: MICROCOCCUS SPECIES     Note: Standardized susceptibility testing for this organism is not available.     3 Note: Gram Stain Report Called to,Read Back By and Verified With: MEREDITH MILLS AT 11:15PM 9 13 BY THOMI   Report Status 03/30/2012 FINAL   Final   CULTURE, BLOOD (ROUTINE X 2)     Status: Normal (Preliminary result)   Collection Time   03/27/12  1:46 PM      Component Value Range Status Comment   Specimen Description BLOOD RIGHT HAND   Final    Special Requests BOTTLES DRAWN AEROBIC AND ANAEROBIC 5CC   Final    Culture  Setup Time 03/27/2012 19:05   Final    Culture     Final    Value:        BLOOD CULTURE RECEIVED NO GROWTH TO DATE CULTURE WILL BE HELD FOR 5 DAYS BEFORE ISSUING A FINAL NEGATIVE REPORT   Report Status PENDING   Incomplete      Labs: Basic Metabolic Panel:  Lab 03/28/12 8413 03/27/12 1102  NA 139 135  K 4.1 3.4*  CL 105 97  CO2 27 24  GLUCOSE 99 170*  BUN 13 19  CREATININE 0.47* <0.20*  CALCIUM 9.5 10.4  MG -- --  PHOS -- --   Liver Function Tests:  Lab 03/27/12 1102  AST 14  ALT <5  ALKPHOS 58  BILITOT 0.5  PROT 6.3  ALBUMIN 3.6  No results found for this basename: LIPASE:5,AMYLASE:5 in the last 168 hours No results found for this basename: AMMONIA:5 in the last 168 hours CBC:  Lab 03/28/12 0335 03/27/12 1102  WBC 9.4 13.1*  NEUTROABS -- 11.0*  HGB 12.2 13.5  HCT 36.3 38.9  MCV 92.8 92.4  PLT 236 269   Cardiac Enzymes: No results found for this basename: CKTOTAL:5,CKMB:5,CKMBINDEX:5,TROPONINI:5 in the last 168  hours BNP: BNP (last 3 results) No results found for this basename: PROBNP:3 in the last 8760 hours CBG:  Lab 03/27/12 1028  GLUCAP 221*    Time coordinating discharge: Over 30 minutes  Signed:  Manson Passey, MD  [facility] 04/01/2012, 10:55 AM  Pager #: [phone]

## 2012-04-02 LAB — CULTURE, BLOOD (ROUTINE X 2): Culture: NO GROWTH

## 2012-04-03 ENCOUNTER — Inpatient Hospital Stay (HOSPITAL_COMMUNITY)
Admission: EM | Admit: 2012-04-03 | Discharge: 2012-04-07 | DRG: 101 | Disposition: A | Discharge status: Skilled Nursing Facility | Payer: 59 | Attending: Internal Medicine | Admitting: Internal Medicine

## 2012-04-03 ENCOUNTER — Emergency Department (HOSPITAL_COMMUNITY): Payer: 59

## 2012-04-03 ENCOUNTER — Encounter (HOSPITAL_COMMUNITY): Payer: Self-pay | Admitting: Internal Medicine

## 2012-04-03 DIAGNOSIS — G4710 Hypersomnia, unspecified: Secondary | ICD-10-CM | POA: Diagnosis present

## 2012-04-03 DIAGNOSIS — I10 Essential (primary) hypertension: Secondary | ICD-10-CM

## 2012-04-03 DIAGNOSIS — R439 Unspecified disturbances of smell and taste: Secondary | ICD-10-CM | POA: Diagnosis present

## 2012-04-03 DIAGNOSIS — F3289 Other specified depressive episodes: Secondary | ICD-10-CM

## 2012-04-03 DIAGNOSIS — G20A1 Parkinson's disease without dyskinesia, without mention of fluctuations: Secondary | ICD-10-CM

## 2012-04-03 DIAGNOSIS — Z79899 Other long term (current) drug therapy: Secondary | ICD-10-CM

## 2012-04-03 DIAGNOSIS — Z880 Allergy status to penicillin: Secondary | ICD-10-CM

## 2012-04-03 DIAGNOSIS — E876 Hypokalemia: Secondary | ICD-10-CM | POA: Diagnosis present

## 2012-04-03 DIAGNOSIS — F411 Generalized anxiety disorder: Secondary | ICD-10-CM | POA: Diagnosis present

## 2012-04-03 DIAGNOSIS — Z23 Encounter for immunization: Secondary | ICD-10-CM

## 2012-04-03 DIAGNOSIS — E8881 Metabolic syndrome: Secondary | ICD-10-CM

## 2012-04-03 DIAGNOSIS — R443 Hallucinations, unspecified: Secondary | ICD-10-CM | POA: Diagnosis present

## 2012-04-03 DIAGNOSIS — F329 Major depressive disorder, single episode, unspecified: Secondary | ICD-10-CM

## 2012-04-03 DIAGNOSIS — R1319 Other dysphagia: Secondary | ICD-10-CM | POA: Diagnosis present

## 2012-04-03 DIAGNOSIS — F4321 Adjustment disorder with depressed mood: Secondary | ICD-10-CM

## 2012-04-03 DIAGNOSIS — G510 Bell's palsy: Secondary | ICD-10-CM | POA: Diagnosis present

## 2012-04-03 DIAGNOSIS — R269 Unspecified abnormalities of gait and mobility: Secondary | ICD-10-CM | POA: Diagnosis present

## 2012-04-03 DIAGNOSIS — Z6828 Body mass index (BMI) 28.0-28.9, adult: Secondary | ICD-10-CM

## 2012-04-03 DIAGNOSIS — Z66 Do not resuscitate: Secondary | ICD-10-CM | POA: Diagnosis present

## 2012-04-03 DIAGNOSIS — E782 Mixed hyperlipidemia: Secondary | ICD-10-CM

## 2012-04-03 DIAGNOSIS — E46 Unspecified protein-calorie malnutrition: Secondary | ICD-10-CM | POA: Diagnosis present

## 2012-04-03 DIAGNOSIS — E119 Type 2 diabetes mellitus without complications: Secondary | ICD-10-CM | POA: Diagnosis present

## 2012-04-03 DIAGNOSIS — Z885 Allergy status to narcotic agent status: Secondary | ICD-10-CM

## 2012-04-03 DIAGNOSIS — Z86718 Personal history of other venous thrombosis and embolism: Secondary | ICD-10-CM

## 2012-04-03 DIAGNOSIS — F0280 Dementia in other diseases classified elsewhere without behavioral disturbance: Secondary | ICD-10-CM | POA: Diagnosis present

## 2012-04-03 DIAGNOSIS — F341 Dysthymic disorder: Secondary | ICD-10-CM | POA: Diagnosis present

## 2012-04-03 DIAGNOSIS — R569 Unspecified convulsions: Principal | ICD-10-CM

## 2012-04-03 DIAGNOSIS — Z9089 Acquired absence of other organs: Secondary | ICD-10-CM

## 2012-04-03 DIAGNOSIS — T43205A Adverse effect of unspecified antidepressants, initial encounter: Secondary | ICD-10-CM | POA: Diagnosis present

## 2012-04-03 DIAGNOSIS — Z780 Asymptomatic menopausal state: Secondary | ICD-10-CM

## 2012-04-03 DIAGNOSIS — Z87440 Personal history of urinary (tract) infections: Secondary | ICD-10-CM

## 2012-04-03 LAB — CBC WITH DIFFERENTIAL/PLATELET
Basophils Absolute: 0 10*3/uL (ref 0.0–0.1)
Basophils Relative: 0 % (ref 0–1)
Eosinophils Absolute: 0.1 10*3/uL (ref 0.0–0.7)
Eosinophils Relative: 1 % (ref 0–5)
HCT: 37.8 % (ref 36.0–46.0)
Hemoglobin: 12.7 g/dL (ref 12.0–15.0)
Lymphocytes Relative: 14 % (ref 12–46)
Lymphs Abs: 1.6 10*3/uL (ref 0.7–4.0)
MCH: 31.4 pg (ref 26.0–34.0)
MCHC: 33.6 g/dL (ref 30.0–36.0)
MCV: 93.6 fL (ref 78.0–100.0)
Monocytes Absolute: 1 10*3/uL (ref 0.1–1.0)
Monocytes Relative: 9 % (ref 3–12)
Neutro Abs: 8.7 10*3/uL — ABNORMAL HIGH (ref 1.7–7.7)
Neutrophils Relative %: 76 % (ref 43–77)
Platelets: 278 10*3/uL (ref 150–400)
RBC: 4.04 MIL/uL (ref 3.87–5.11)
RDW: 13.3 % (ref 11.5–15.5)
WBC: 11.5 10*3/uL — ABNORMAL HIGH (ref 4.0–10.5)

## 2012-04-03 LAB — COMPREHENSIVE METABOLIC PANEL
ALT: 6 U/L (ref 0–35)
AST: 44 U/L — ABNORMAL HIGH (ref 0–37)
Albumin: 3.4 g/dL — ABNORMAL LOW (ref 3.5–5.2)
Alkaline Phosphatase: 59 U/L (ref 39–117)
BUN: 21 mg/dL (ref 6–23)
CO2: 28 mEq/L (ref 19–32)
Calcium: 10.1 mg/dL (ref 8.4–10.5)
Chloride: 94 mEq/L — ABNORMAL LOW (ref 96–112)
Creatinine, Ser: 0.47 mg/dL — ABNORMAL LOW (ref 0.50–1.10)
GFR calc Af Amer: >90 mL/min (ref >90)
GFR calc non Af Amer: >90 mL/min (ref >90)
Glucose, Bld: 116 mg/dL — ABNORMAL HIGH (ref 70–99)
Potassium: 3.4 mEq/L — ABNORMAL LOW (ref 3.5–5.1)
Sodium: 130 mEq/L — ABNORMAL LOW (ref 135–145)
Total Bilirubin: 0.5 mg/dL (ref 0.3–1.2)
Total Protein: 6 g/dL (ref 6.0–8.3)

## 2012-04-03 MED ORDER — LEVETIRACETAM 500 MG PO TABS
500.0000 mg | ORAL_TABLET | Freq: Two times a day (BID) | ORAL | Status: DC
  Administered 2012-04-04 – 2012-04-05 (×5): 500 mg via ORAL
  Filled 2012-04-03 (×7): qty 1

## 2012-04-03 MED ORDER — LORAZEPAM 2 MG/ML IJ SOLN
INTRAMUSCULAR | Status: AC
  Filled 2012-04-03: qty 1

## 2012-04-03 MED ORDER — SODIUM CHLORIDE 0.9 % IV SOLN
1000.0000 mg | Freq: Once | INTRAVENOUS | Status: AC
  Administered 2012-04-03: 1000 mg via INTRAVENOUS
  Filled 2012-04-03: qty 20

## 2012-04-03 NOTE — ED Notes (Signed)
Pt had seizure lasting approx 3 mins. Postictal now. Dr. Estell Harpin witnessed. Family admits that pt has been placed on Wellbutrin which is now new.

## 2012-04-03 NOTE — H&P (Signed)
[facility] History and Physical  [name] WUJ:[id number] DOB: Dec 26, 1945 DOA: 04/03/2012   PCP: [name], MD   Chief Complaint:  Chief Complaint  Patient presents with  . Seizure Activity      HPI: [name] is a 66 y.o. female with history of Parkinson's and dementia who was recently admitted to the hospital for urinary tract infection and failure to thrive. While in the hospital she was started on Wellbutrin which was quickly titrated up to help her with her severe depression. She returned tonight from the nursing home having grand mal seizures. She regains consciousness between the episodes. She had another seizure here in the emergency room. By the time of my evaluation she is awake recognizes her daughter and she can speak. Patient denies any pain   Review of Systems: Unobtainable due to dementia  Past Medical History  Diagnosis Date  . Dysmetabolic syndrome X   . Personal history of thrombophlebitis   . Essential hypertension, benign   . Morbid obesity   . Mixed hyperlipidemia   . Nonspecific abnormal electrocardiogram (ECG) (EKG)   . Special screening for malignant neoplasms of other sites   . Depressive disorder, not elsewhere classified   . Bell's palsy   . Need for prophylactic hormone replacement therapy (postmenopausal)   . Adjustment disorder with depressed mood   . Need for prophylactic vaccination and inoculation against influenza   . DVT (deep venous thrombosis), left "early 2000's"    LLE  . Syncope and collapse 12/06/11    "this am; I pass out fairly often"  . Type II or unspecified type diabetes mellitus without mention of complication, not stated as uncontrolled 12/06/11    "not anymore; had lap band OR"  . Neurodegenerative gait disorder   . Headache 05/2011    "pretty often since they put me on Parkinson's medicine"  . Parkinson disease     "they think that's what it is"  . DEMENTIA   . 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 Anxiety   . History of shingles     "as a teen and in my 86's"   Past Surgical History  Procedure Date  . Tonsillectomy and adenoidectomy 1970  . Bladder tack 11/1995  . Septoplasty 08/1998    with antral window   . Knee arthroscopy 12/12/2002    right   . Fracture surgery 02/09/2006    bilateral elbows  . Fracture surgery     right knee  . Fracture surgery     tib plateau  . Breast cyst aspiration 12/1998    bilaterally  . Breast biopsy 05/20/2000    bilaterally  . Tubal ligation 1970's  . Cataract extraction w/ intraocular lens  implant, bilateral   . Laparoscopic gastric banding 2008  . Cardiac catheterization 2004   Social History:  reports that she has never smoked. She has never used smokeless tobacco. She reports that she does not drink alcohol or use illicit drugs.   Allergies  Allergen Reactions  . Codeine Sulfate     REACTION: hallucinations  . Penicillins Anaphylaxis, Swelling and Rash    Family History  Problem Relation Age of Onset  . COPD Father   . Diabetes Sister   . Vision loss Sister     legally blind  . Asthma Mother   . Heart disease Father      Prior to Admission medications   Medication Sig Start  Date End Date Taking? Authorizing Provider  acetaminophen (TYLENOL) 325 MG tablet Take 2 tablets (650 mg total) by mouth every 6 (six) hours as needed for pain or fever (or Fever >/= 101). 04/01/12 04/01/13 Yes Alison Murray, MD  albuterol (PROVENTIL) (5 MG/ML) 0.5% nebulizer solution Take 0.5 mLs (2.5 mg total) by nebulization every 6 (six) hours as needed for wheezing or shortness of breath. 04/01/12 04/01/13 Yes Alma Concepcion Elk, MD  ALPRAZolam Prudy Feeler) 0.5 MG tablet Take 0.5 mg by mouth 2 (two) times daily.   Yes Historical Provider, MD  aspirin 81 MG tablet Take 81 mg by mouth daily.    Yes Historical Provider, MD  buPROPion (WELLBUTRIN XL) 150 MG 24 hr tablet Take 450 mg by mouth daily. 04/01/12  Yes Alison Murray, MD    carbidopa-levodopa (SINEMET IR) 25-100 MG per tablet Take 3 tablets by mouth 3 (three) times daily. 10/19/11  Yes Milas Gain, MD  Cholecalciferol (VITAMIN D3) 1000 UNITS CAPS Take 1 capsule by mouth daily.     Yes Historical Provider, MD  feeding supplement (ENSURE COMPLETE) LIQD Take 237 mLs by mouth 2 (two) times daily between meals. 04/01/12  Yes Alison Murray, MD  FLUoxetine (PROZAC) 20 MG capsule Take 20 mg by mouth daily.    Yes Historical Provider, MD  hydroxypropyl methylcellulose (ISOPTO TEARS) 2.5 % ophthalmic solution Place 1 drop into both eyes daily.   Yes Historical Provider, MD  losartan-hydrochlorothiazide (HYZAAR) 50-12.5 MG per tablet Take 1 tablet by mouth daily with breakfast.    Yes Historical Provider, MD  Multiple Vitamin (MULTIVITAMIN WITH MINERALS) TABS Take 1 tablet by mouth daily.   Yes Historical Provider, MD  simvastatin (ZOCOR) 80 MG tablet Take 80 mg by mouth at bedtime.   Yes Historical Provider, MD  vitamin B-12 (CYANOCOBALAMIN) 1000 MCG tablet Take 1,000 mcg by mouth daily.     Yes Historical Provider, MD   Physical Exam: Filed Vitals:   04/03/12 1924 04/03/12 1928  BP: 128/67 135/61  Pulse: 80 81  Temp:  98 F (36.7 C)  TempSrc:  Oral  Resp:  20  SpO2:  94%     General:  Alert, oriented to daughter  Eyes: Pupils are 4 mm, symmetric and reactive to light, extraocular movements intact  ENT: Clear pharynx without exudates  Neck: No jugular venous distention  Cardiovascular: Regular rate and rhythm without murmurs rubs or gallops  Respiratory: Clear to auscultation bilaterally  Abdomen: Soft nontender  Skin: Warm dry without rashes  Musculoskeletal: Intact  Psychiatric: She is unable to cooperate fully  Neurologic: No tremors, mild rigidity, able to move 4 extremities  Labs on Admission:  Basic Metabolic Panel:  Lab 04/03/12 9147 03/28/12 0335  NA 130* 139  K 3.4* 4.1  CL 94* 105  CO2 28 27  GLUCOSE 116* 99  BUN 21 13   CREATININE 0.47* 0.47*  CALCIUM 10.1 9.5  MG -- --  PHOS -- --   Liver Function Tests:  Lab 04/03/12 1945  AST 44*  ALT 6  ALKPHOS 59  BILITOT 0.5  PROT 6.0  ALBUMIN 3.4*   No results found for this basename: LIPASE:5,AMYLASE:5 in the last 168 hours No results found for this basename: AMMONIA:5 in the last 168 hours CBC:  Lab 04/03/12 1945 03/28/12 0335  WBC 11.5* 9.4  NEUTROABS 8.7* --  HGB 12.7 12.2  HCT 37.8 36.3  MCV 93.6 92.8  PLT 278 236   Cardiac Enzymes: No results found  for this basename: CKTOTAL:5,CKMB:5,CKMBINDEX:5,TROPONINI:5 in the last 168 hours  BNP (last 3 results) No results found for this basename: PROBNP:3 in the last 8760 hours CBG: No results found for this basename: GLUCAP:5 in the last 168 hours  Radiological Exams on Admission: Ct Head Wo Contrast  04/03/2012  *RADIOLOGY REPORT*  Clinical Data: Sudden onset of seizure  CT HEAD WITHOUT CONTRAST  Technique:  Contiguous axial images were obtained from the base of the skull through the vertex without contrast.  Comparison: CT brain scan of 03/27/2012  Findings: The ventricular system remains prominent as are the cortical sulci, indicative of diffuse atrophy.  Moderate small vessel ischemic change is noted throughout the periventricular white matter.  No hemorrhage, mass lesion, or acute infarction is seen.  On bone window images, no calvarial abnormality is seen. The paranasal sinuses are clear.  IMPRESSION: Diffuse atrophy and small vessel disease.  No acute intracranial abnormality.   Original Report Authenticated By: Juline Patch, M.D.      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 Seizure activity-suspected due to Wellbutrin. Imaging of the head without any masses or strokes. We shall be observing the patient in the hospital. We will keep her on telemetry, obtain frequent neurological checks, stop Wellbutrin,  start Keppra. We will obtain an EEG. Might be worthwhile contacting primary neurologist Dr. Modesto Charon. 2. Dementia with difficulty ambulating-we'll obtain physical therapy evaluation 3. The daughter requesting a different nursing home 4. Mild hypokalemia-stop HCTZ-replete potassium IV  Code Status: dnr Family Communication: daughter Disposition Plan: snf  Time spent: 1 hour  Kemon Devincenzi [facility] Pager [phone]  If 7PM-7AM, please contact night-coverage [email or web address] Password [email or web address] 04/03/2012, 11:23 PM

## 2012-04-03 NOTE — ED Provider Notes (Signed)
History     CSN: [id number]  Arrival date & time 04/03/12  1908   First MD Initiated Contact with Patient 04/03/12 2005      Chief Complaint  Patient presents with  . Seizure Activity     (Consider location/radiation/quality/duration/timing/severity/associated sxs/prior treatment) Patient is a 66 y.o. female presenting with seizures. The history is provided by a relative (pt had  a sz at [facility]). No language interpreter was used.  Seizures  This is a new problem. The current episode started 3 to 5 hours ago. The problem has been resolved. There was 1 seizure. Associated symptoms include sleepiness. Pertinent negatives include no headaches, no chest pain, no cough and no diarrhea. Characteristics include eye blinking. The episode was witnessed. There was no sensation of an aura present. The seizures continued in the ED. The seizure(s) had no focality. Possible causes include med or dosage change. There has been no fever.    Past Medical History  Diagnosis Date  . Dysmetabolic syndrome X   . Personal history of thrombophlebitis   . Essential hypertension, benign   . Morbid obesity   . Mixed hyperlipidemia   . Nonspecific abnormal electrocardiogram (ECG) (EKG)   . Special screening for malignant neoplasms of other sites   . Depressive disorder, not elsewhere classified   . Bell's palsy   . Need for prophylactic hormone replacement therapy (postmenopausal)   . Adjustment disorder with depressed mood   . Need for prophylactic vaccination and inoculation against influenza   . DVT (deep venous thrombosis), left "early 2000's"    LLE  . Syncope and collapse 12/06/11    "this am; I pass out fairly often"  . Type II or unspecified type diabetes mellitus without mention of complication, not stated as uncontrolled 12/06/11    "not anymore; had lap band OR"  . Neurodegenerative gait disorder   . Headache 05/2011    "pretty often since they put me on Parkinson's medicine"  . Parkinson disease       "they think that's what it is"  . DEMENTIA   . 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 Anxiety   . History of shingles     "as a teen and in my 86's"    Past Surgical History  Procedure Date  . Tonsillectomy and adenoidectomy 1970  . Bladder tack 11/1995  . Septoplasty 08/1998    with antral window   . Knee arthroscopy 12/12/2002    right   . Fracture surgery 02/09/2006    bilateral elbows  . Fracture surgery     right knee  . Fracture surgery     tib plateau  . Breast cyst aspiration 12/1998    bilaterally  . Breast biopsy 05/20/2000    bilaterally  . Tubal ligation 1970's  . Cataract extraction w/ intraocular lens  implant, bilateral   . Laparoscopic gastric banding 2008  . Cardiac catheterization 2004    Family History  Problem Relation Age of Onset  . COPD Father   . Diabetes Sister   . Vision loss Sister     legally blind  . Asthma Mother   . Heart disease Father     History  Substance Use Topics  . Smoking status: Never Smoker   . Smokeless tobacco: Never Used  . Alcohol Use: No    OB History    Grav Para Term Preterm Abortions TAB SAB Ect Mult Living  Review of Systems  Constitutional: Negative for fatigue.  HENT: Negative for congestion, sinus pressure and ear discharge.   Eyes: Negative for discharge.  Respiratory: Negative for cough.   Cardiovascular: Negative for chest pain.  Gastrointestinal: Negative for abdominal pain and diarrhea.  Genitourinary: Negative for frequency and hematuria.  Musculoskeletal: Negative for back pain.  Skin: Negative for rash.  Neurological: Positive for seizures. Negative for headaches.  Hematological: Negative.   Psychiatric/Behavioral: Negative for hallucinations.    Allergies  Codeine sulfate and Penicillins  Home Medications   Current Outpatient Rx  Name Route Sig Dispense Refill  . ACETAMINOPHEN 325 MG PO TABS Oral Take 2 tablets (650 mg total) by  mouth every 6 (six) hours as needed for pain or fever (or Fever >/= 101). 30 tablet 0  . ALBUTEROL SULFATE (5 MG/ML) 0.5% IN NEBU Nebulization Take 0.5 mLs (2.5 mg total) by nebulization every 6 (six) hours as needed for wheezing or shortness of breath. 20 mL 0  . ALPRAZOLAM 0.5 MG PO TABS Oral Take 0.5 mg by mouth 2 (two) times daily.    . ASPIRIN 81 MG PO TABS Oral Take 81 mg by mouth daily.     . BUPROPION HCL ER (XL) 150 MG PO TB24 Oral Take 450 mg by mouth daily.    Marland Kitchen CARBIDOPA-LEVODOPA 25-100 MG PO TABS Oral Take 3 tablets by mouth 3 (three) times daily.    Marland Kitchen VITAMIN D3 1000 UNITS PO CAPS Oral Take 1 capsule by mouth daily.      Marland Kitchen ENSURE COMPLETE PO LIQD Oral Take 237 mLs by mouth 2 (two) times daily between meals. 1 Bottle 11  . FLUOXETINE HCL 20 MG PO CAPS Oral Take 20 mg by mouth daily.     Marland Kitchen HYPROMELLOSE 2.5 % OP SOLN Both Eyes Place 1 drop into both eyes daily.    Marland Kitchen LOSARTAN POTASSIUM-HCTZ 50-12.5 MG PO TABS Oral Take 1 tablet by mouth daily with breakfast.     . ADULT MULTIVITAMIN W/MINERALS CH Oral Take 1 tablet by mouth daily.    Marland Kitchen SIMVASTATIN 80 MG PO TABS Oral Take 80 mg by mouth at bedtime.    Marland Kitchen VITAMIN B-12 1000 MCG PO TABS Oral Take 1,000 mcg by mouth daily.        BP 135/61  Pulse 81  Temp 98 F (36.7 C) (Oral)  Resp 20  SpO2 94%  Physical Exam  Constitutional: She is oriented to person, place, and time. She appears well-developed.  HENT:  Head: Normocephalic and atraumatic.  Eyes: Conjunctivae and EOM are normal. No scleral icterus.  Neck: Neck supple. No thyromegaly present.  Cardiovascular: Normal rate and regular rhythm.  Exam reveals no gallop and no friction rub.   No murmur heard. Pulmonary/Chest: No stridor. She has no wheezes. She has no rales. She exhibits no tenderness.  Abdominal: She exhibits no distension. There is no tenderness. There is no rebound.  Musculoskeletal: Normal range of motion. She exhibits no edema.  Lymphadenopathy:    She has no  cervical adenopathy.  Neurological: She is oriented to person, place, and time. Coordination normal.  Skin: No rash noted. No erythema.  Psychiatric: She has a normal mood and affect. Her behavior is normal.    ED Course  Procedures (including critical care time)  Labs Reviewed  CBC WITH DIFFERENTIAL - Abnormal; Notable for the following:    WBC 11.5 (*)     Neutro Abs 8.7 (*)     All other components within  normal limits  COMPREHENSIVE METABOLIC PANEL - Abnormal; Notable for the following:    Sodium 130 (*)     Potassium 3.4 (*)     Chloride 94 (*)     Glucose, Bld 116 (*)     Creatinine, Ser 0.47 (*)     Albumin 3.4 (*)     AST 44 (*)     All other components within normal limits  URINALYSIS, ROUTINE W REFLEX MICROSCOPIC   Ct Head Wo Contrast  04/03/2012  *RADIOLOGY REPORT*  Clinical Data: Sudden onset of seizure  CT HEAD WITHOUT CONTRAST  Technique:  Contiguous axial images were obtained from the base of the skull through the vertex without contrast.  Comparison: CT brain scan of 03/27/2012  Findings: The ventricular system remains prominent as are the cortical sulci, indicative of diffuse atrophy.  Moderate small vessel ischemic change is noted throughout the periventricular white matter.  No hemorrhage, mass lesion, or acute infarction is seen.  On bone window images, no calvarial abnormality is seen. The paranasal sinuses are clear.  IMPRESSION: Diffuse atrophy and small vessel disease.  No acute intracranial abnormality.   Original Report Authenticated By: Juline Patch, M.D.      1. Seizure       MDM          Benny Lennert, MD 04/03/12 2325

## 2012-04-03 NOTE — ED Notes (Signed)
RUE:AV40<JW> Expected date:<BR> Expected time:<BR> Means of arrival:<BR> Comments:<BR> Syncopal episode 1hr ago; currently A&amp;O to norm

## 2012-04-03 NOTE — ED Notes (Addendum)
Pt moved to [facility] two days ago for rehab because of UTI that she had several days ago that made her weak. Today pt was in wheelchair visiting with daughter when her legs straightened out, R leg straightened out and pt began drooling. Episode lasted approx 15 mins and pt had no recollection of event afterwards. Pt is now alert and oriented. Hx of Bell's Palsy twice so L side of face is drawn which daughter states is normal. No hx of seizures. BS 87.

## 2012-04-04 ENCOUNTER — Other Ambulatory Visit: Payer: Self-pay

## 2012-04-04 ENCOUNTER — Encounter (HOSPITAL_COMMUNITY): Payer: Self-pay

## 2012-04-04 ENCOUNTER — Inpatient Hospital Stay (HOSPITAL_COMMUNITY)
Admit: 2012-04-04 | Discharge: 2012-04-04 | Disposition: A | Discharge status: Home / Self Care | Payer: 59 | Attending: Internal Medicine | Admitting: Internal Medicine

## 2012-04-04 DIAGNOSIS — F0280 Dementia in other diseases classified elsewhere without behavioral disturbance: Secondary | ICD-10-CM

## 2012-04-04 DIAGNOSIS — I10 Essential (primary) hypertension: Secondary | ICD-10-CM

## 2012-04-04 DIAGNOSIS — R569 Unspecified convulsions: Principal | ICD-10-CM

## 2012-04-04 DIAGNOSIS — F068 Other specified mental disorders due to known physiological condition: Secondary | ICD-10-CM

## 2012-04-04 LAB — CBC
HCT: 38.5 % (ref 36.0–46.0)
Hemoglobin: 12.7 g/dL (ref 12.0–15.0)
MCH: 31.2 pg (ref 26.0–34.0)
MCHC: 33 g/dL (ref 30.0–36.0)
MCV: 94.6 fL (ref 78.0–100.0)
Platelets: 246 10*3/uL (ref 150–400)
RBC: 4.07 MIL/uL (ref 3.87–5.11)
RDW: 13.5 % (ref 11.5–15.5)
WBC: 11.8 10*3/uL — ABNORMAL HIGH (ref 4.0–10.5)

## 2012-04-04 LAB — BASIC METABOLIC PANEL
BUN: 16 mg/dL (ref 6–23)
CO2: 29 mEq/L (ref 19–32)
Calcium: 9.9 mg/dL (ref 8.4–10.5)
Chloride: 98 mEq/L (ref 96–112)
Creatinine, Ser: 0.58 mg/dL (ref 0.50–1.10)
GFR calc Af Amer: >90 mL/min (ref >90)
GFR calc non Af Amer: >90 mL/min (ref >90)
Glucose, Bld: 91 mg/dL (ref 70–99)
Potassium: 3.6 mEq/L (ref 3.5–5.1)
Sodium: 135 mEq/L (ref 135–145)

## 2012-04-04 LAB — URINALYSIS, ROUTINE W REFLEX MICROSCOPIC
Bilirubin Urine: NEGATIVE
Glucose, UA: NEGATIVE mg/dL
Hgb urine dipstick: NEGATIVE
Ketones, ur: NEGATIVE mg/dL
Nitrite: NEGATIVE
Protein, ur: NEGATIVE mg/dL
Specific Gravity, Urine: 1.012 (ref 1.005–1.030)
Urobilinogen, UA: 0.2 mg/dL (ref 0.0–1.0)
pH: 6.5 (ref 5.0–8.0)

## 2012-04-04 LAB — URINE MICROSCOPIC-ADD ON

## 2012-04-04 LAB — MRSA PCR SCREENING: MRSA by PCR: NEGATIVE

## 2012-04-04 MED ORDER — ADULT MULTIVITAMIN W/MINERALS CH
1.0000 | ORAL_TABLET | Freq: Every day | ORAL | Status: DC
  Administered 2012-04-04 – 2012-04-07 (×4): 1 via ORAL
  Filled 2012-04-04 (×4): qty 1

## 2012-04-04 MED ORDER — SODIUM CHLORIDE 0.9 % IJ SOLN: 3.0000 mL | INTRAMUSCULAR | Status: DC | PRN

## 2012-04-04 MED ORDER — ALUM & MAG HYDROXIDE-SIMETH 200-200-20 MG/5ML PO SUSP: 30.0000 mL | Freq: Four times a day (QID) | ORAL | Status: DC | PRN

## 2012-04-04 MED ORDER — POTASSIUM CHLORIDE 10 MEQ/100ML IV SOLN: 10.0000 meq | INTRAVENOUS | Status: AC

## 2012-04-04 MED ORDER — VITAMIN D 1000 UNITS PO TABS
1000.0000 [IU] | ORAL_TABLET | Freq: Every day | ORAL | Status: DC
  Administered 2012-04-04 – 2012-04-07 (×4): 1000 [IU] via ORAL
  Filled 2012-04-04 (×4): qty 1

## 2012-04-04 MED ORDER — ACETAMINOPHEN 325 MG PO TABS: 650.0000 mg | ORAL_TABLET | Freq: Four times a day (QID) | ORAL | Status: DC | PRN

## 2012-04-04 MED ORDER — POLYVINYL ALCOHOL 1.4 % OP SOLN
1.0000 [drp] | Freq: Every day | OPHTHALMIC | Status: DC
  Administered 2012-04-04 – 2012-04-07 (×4): 1 [drp] via OPHTHALMIC
  Filled 2012-04-04: qty 15

## 2012-04-04 MED ORDER — VITAMIN B-12 1000 MCG PO TABS
1000.0000 ug | ORAL_TABLET | Freq: Every day | ORAL | Status: DC
  Administered 2012-04-04 – 2012-04-07 (×4): 1000 ug via ORAL
  Filled 2012-04-04 (×4): qty 1

## 2012-04-04 MED ORDER — SODIUM CHLORIDE 0.9 % IJ SOLN
3.0000 mL | Freq: Two times a day (BID) | INTRAMUSCULAR | Status: DC
  Administered 2012-04-04 – 2012-04-06 (×3): 3 mL via INTRAVENOUS

## 2012-04-04 MED ORDER — ACETAMINOPHEN 650 MG RE SUPP: 650.0000 mg | Freq: Four times a day (QID) | RECTAL | Status: DC | PRN

## 2012-04-04 MED ORDER — FLUOXETINE HCL 20 MG PO CAPS
20.0000 mg | ORAL_CAPSULE | Freq: Every day | ORAL | Status: DC
  Administered 2012-04-04 – 2012-04-07 (×4): 20 mg via ORAL
  Filled 2012-04-04 (×4): qty 1

## 2012-04-04 MED ORDER — ALPRAZOLAM 0.5 MG PO TABS
0.5000 mg | ORAL_TABLET | Freq: Two times a day (BID) | ORAL | Status: DC
  Administered 2012-04-05 – 2012-04-07 (×4): 0.5 mg via ORAL
  Filled 2012-04-04 (×4): qty 1

## 2012-04-04 MED ORDER — ENOXAPARIN SODIUM 40 MG/0.4ML ~~LOC~~ SOLN
40.0000 mg | SUBCUTANEOUS | Status: DC
  Administered 2012-04-04 – 2012-04-07 (×4): 40 mg via SUBCUTANEOUS
  Filled 2012-04-04 (×4): qty 0.4

## 2012-04-04 MED ORDER — ASPIRIN EC 81 MG PO TBEC
81.0000 mg | DELAYED_RELEASE_TABLET | Freq: Every day | ORAL | Status: DC
  Administered 2012-04-04 – 2012-04-07 (×4): 81 mg via ORAL
  Filled 2012-04-04 (×4): qty 1

## 2012-04-04 MED ORDER — LOSARTAN POTASSIUM 50 MG PO TABS
50.0000 mg | ORAL_TABLET | Freq: Every day | ORAL | Status: DC
  Administered 2012-04-04 – 2012-04-07 (×4): 50 mg via ORAL
  Filled 2012-04-04 (×4): qty 1

## 2012-04-04 MED ORDER — LOSARTAN POTASSIUM-HCTZ 50-12.5 MG PO TABS: 1.0000 | ORAL_TABLET | Freq: Every day | ORAL | Status: DC

## 2012-04-04 MED ORDER — ATORVASTATIN CALCIUM 40 MG PO TABS
40.0000 mg | ORAL_TABLET | Freq: Every day | ORAL | Status: DC
  Administered 2012-04-04 – 2012-04-06 (×3): 40 mg via ORAL
  Filled 2012-04-04 (×4): qty 1

## 2012-04-04 MED ORDER — ENSURE COMPLETE PO LIQD
237.0000 mL | Freq: Two times a day (BID) | ORAL | Status: DC
  Administered 2012-04-04 – 2012-04-07 (×6): 237 mL via ORAL

## 2012-04-04 MED ORDER — HYDROCHLOROTHIAZIDE 12.5 MG PO CAPS
12.5000 mg | ORAL_CAPSULE | Freq: Every day | ORAL | Status: DC
  Administered 2012-04-04 – 2012-04-07 (×4): 12.5 mg via ORAL
  Filled 2012-04-04 (×4): qty 1

## 2012-04-04 MED ORDER — SODIUM CHLORIDE 0.9 % IJ SOLN
3.0000 mL | Freq: Two times a day (BID) | INTRAMUSCULAR | Status: DC
  Administered 2012-04-05 (×2): 3 mL via INTRAVENOUS
  Administered 2012-04-06: 11:00:00 via INTRAVENOUS
  Administered 2012-04-06 – 2012-04-07 (×2): 3 mL via INTRAVENOUS

## 2012-04-04 MED ORDER — SODIUM CHLORIDE 0.9 % IV SOLN
250.0000 mL | INTRAVENOUS | Status: DC | PRN
Start: 2012-04-04 — End: 2012-04-07

## 2012-04-04 MED ORDER — CARBIDOPA-LEVODOPA 25-100 MG PO TABS
3.0000 | ORAL_TABLET | Freq: Three times a day (TID) | ORAL | Status: DC
  Administered 2012-04-04 – 2012-04-07 (×10): 3 via ORAL
  Filled 2012-04-04 (×16): qty 3

## 2012-04-04 NOTE — Progress Notes (Signed)
Offsite EEG completed at [facility]. 

## 2012-04-04 NOTE — Telephone Encounter (Signed)
[facility] refill for fluoxetine 20 mg. Ref # [id number]. On med list listed as inpatient.Please advise.

## 2012-04-04 NOTE — Evaluation (Signed)
Physical Therapy Evaluation Patient Details Name: [name] MRN: [id number] DOB: 1945-11-09 Today's Date: 04/04/2012 Time: 4098-1191 PT Time Calculation (min): 17 min  PT Assessment / Plan / Recommendation Clinical Impression  66 yo female admitted with seizures, hallucinations. Hx of Parkinson's, dementia. On eval, pt required +2 assist for safe mobility due to decreased alertness, impaired balance. Pt had difficulty keeping eyes open throughout session. Recommend SNF for rehab.     PT Assessment  Patient needs continued PT services    Follow Up Recommendations  Skilled nursing facility    Barriers to Discharge        Equipment Recommendations  Defer to next venue    Recommendations for Other Services OT consult   Frequency Min 3X/week    Precautions / Restrictions Precautions Precautions: Fall Restrictions Weight Bearing Restrictions: No   Pertinent Vitals/Pain       Mobility  Bed Mobility Bed Mobility: Right Sidelying to Sit;Sit to Sidelying Left Supine to Sit: 2: Max assist Sit to Sidelying Right: 2: Max assist Sit to Sidelying Left: 2: Max assist Details for Bed Mobility Assistance: Multimodal cues for safety, technique, hand placement, initiation of task. Assist for trunk to upright/sidelying and for bil LEoff/onto bed.  Transfers Transfers: Sit to Stand;Stand to Sit Sit to Stand: 1: +2 Total assist;From bed;From chair/3-in-1;With upper extremity assist Sit to Stand: Patient Percentage: 80% Stand to Sit: 1: +2 Total assist;With upper extremity assist;To chair/3-in-1;To bed Stand to Sit: Patient Percentage: 80% Details for Transfer Assistance: x 2. Multimodal cues for safety, technique, hand placement. Assist to rise, stabilize, control descent.  Ambulation/Gait Ambulation/Gait Assistance: 1: +2 Total assist Ambulation/Gait: Patient Percentage: 70% Ambulation Distance (Feet): 100 Feet Assistive device: Rolling walker Ambulation/Gait Assistance Details:  Multimodal cues for safety, posture, distance from RW, eyes open. Pt very unsteady. Noted minimal leaning to R side. Assist to stabilize, manuever with RW.     Exercises     PT Diagnosis: Difficulty walking;Altered mental status  PT Problem List: Decreased mobility;Decreased balance;Decreased knowledge of use of DME;Decreased safety awareness;Decreased coordination PT Treatment Interventions: DME instruction;Gait training;Functional mobility training;Therapeutic activities;Therapeutic exercise;Balance training;Patient/family education   PT Goals Acute Rehab PT Goals PT Goal Formulation: Patient unable to participate in goal setting Time For Goal Achievement: 04/18/12 Potential to Achieve Goals: Good Pt will go Supine/Side to Sit: with min assist PT Goal: Supine/Side to Sit - Progress: Goal set today Pt will go Sit to Supine/Side: with min assist PT Goal: Sit to Supine/Side - Progress: Goal set today Pt will go Sit to Stand: with min assist PT Goal: Sit to Stand - Progress: Goal set today Pt will go Stand to Sit: with min assist PT Goal: Stand to Sit - Progress: Goal set today Pt will Ambulate: 51 - 150 feet;with min assist;with least restrictive assistive device PT Goal: Ambulate - Progress: Goal set today  Visit Information  Last PT Received On: 04/04/12 Assistance Needed: +2 PT/OT Co-Evaluation/Treatment: Yes    Subjective Data  Subjective: Very little verbalizations from pt. Pt with eyes closed most of session. Patient Stated Goal: Home   Prior Functioning  Home Living Additional Comments: Per chart, pt is from SNF. Per pt, she is from home Prior Function Comments: Unsure of PLOF.  Communication Communication: No difficulties Dominant Hand: Right    Cognition  Overall Cognitive Status: Impaired Area of Impairment: Attention;Memory;Following commands;Safety/judgement;Awareness of errors;Problem solving Difficult to assess due to: Level of arousal Arousal/Alertness:  Lethargic Orientation Level:  (Unable to assess due to decreased  alertness) Behavior During Session: Lethargic Current Attention Level: Focused Attention - Other Comments: Pt had difficulty keeping eyes open during session Following Commands: Follows one step commands inconsistently Safety/Judgement: Decreased safety judgement for tasks assessed Awareness of Errors: Assistance required to identify errors made;Assistance required to correct errors made    Extremity/Trunk Assessment Right Upper Extremity Assessment RUE ROM/Strength/Tone: Deficits RUE ROM/Strength/Tone Deficits: very slow movement:  AAROM WFLs, AROM of shoulder limited to about 110 RUE Sensation:  (did not test: pt states wfls) Left Upper Extremity Assessment LUE ROM/Strength/Tone: Within functional levels Right Lower Extremity Assessment RLE ROM/Strength/Tone: Deficits;Unable to fully assess RLE ROM/Strength/Tone Deficits: Strength at least 3+/5 with functional activity. Difficulty assessing due to decreased level of alertness RLE Coordination: Deficits RLE Coordination Deficits: Slow movements noted. Left Lower Extremity Assessment LLE ROM/Strength/Tone: Deficits;Unable to fully assess LLE ROM/Strength/Tone Deficits: Strength at least 3+/5 with functional activity. Difficulty assessing due to decreased level of alertness LLE Coordination: Deficits LLE Coordination Deficits: Slow movements noted   Balance Balance Balance Assessed: Yes Static Sitting Balance Static Sitting - Balance Support: Right upper extremity supported Static Sitting - Level of Assistance: 4: Min assist Static Sitting - Comment/# of Minutes: Leans to R side and props on R hand.  Static Standing Balance Static Standing - Balance Support: Bilateral upper extremity supported;During functional activity Static Standing - Level of Assistance: 4: Min assist  End of Session PT - End of Session Equipment Utilized During Treatment: Gait belt Activity  Tolerance:  (Limited by decreased alertness) Patient left: in bed;with call bell/phone within reach;with bed alarm set  GP     Rebeca Alert [facility] 04/04/2012, 11:43 AM [phone]

## 2012-04-04 NOTE — Progress Notes (Signed)
Spoke with pharmacy in regards to the ordered potassium that was never received. Was previously requested via MAR and noted to be done but was never received. Called to follow up and spoke with day shift pharm tech--- was told to re-notify because night shift states "They sent up a lot so they do not know if it is up there or not". Pt's labs show increase in potassium level. Day shift RN aware and voiced that she would just speak with the MD when he rounds to see if she should still receive the IV potassium.

## 2012-04-04 NOTE — Clinical Social Work Placement (Signed)
     Clinical Social Work Department CLINICAL SOCIAL WORK PLACEMENT NOTE 04/07/2012  Patient:  [name], [name]  Account Number:  [id number] Admit date:  04/03/2012  Clinical Social Worker:  Becky Sax, LCSW  Date/time:  04/04/2012 12:22 PM  Clinical Social Work is seeking post-discharge placement for this patient at the following level of care:   SKILLED NURSING   (*CSW will update this form in Epic as items are completed)   04/04/2012  Patient/family provided with [facility] Department of Clinical Social Works list of facilities offering this level of care within the geographic area requested by the patient (or if unable, by the patients family).  04/04/2012  Patient/family informed of their freedom to choose among providers that offer the needed level of care, that participate in Medicare, Medicaid or managed care program needed by the patient, have an available bed and are willing to accept the patient.  04/04/2012  Patient/family informed of [facility] ownership interest in [facility], as well as of the fact that they are under no obligation to receive care at this facility.  PASARR submitted to EDS on 04/04/2012 PASARR number received from EDS on 04/04/2012  FL2 transmitted to all facilities in geographic area requested by pt/family on  04/04/2012 FL2 transmitted to all facilities within larger geographic area on 04/04/2012  Patient informed that his/her managed care company has contracts with or will negotiate with  certain facilities, including the following:     Patient/family informed of bed offers received:  04/07/2012 Patient chooses bed at [facility] Physician recommends and patient chooses bed at    Patient to be transferred to [facility] on  04/07/2012 Patient to be transferred to facility by [facility]  The following physician request were entered in Epic:   Additional Comments:

## 2012-04-04 NOTE — Telephone Encounter (Signed)
She is currently in the hospital.  It looks like they are adjusting her depression meds.  Please verify with patient's daughter prior to refilling.

## 2012-04-04 NOTE — Clinical Social Work Psychosocial (Unsigned)
     Clinical Social Work Department BRIEF PSYCHOSOCIAL ASSESSMENT 04/04/2012  Patient:  [name], [name]     Account Number:  [id number]     Admit date:  04/03/2012  Clinical Social Worker:  Hattie Perch  Date/Time:  04/04/2012 12:00 M  Referred by:  Physician  Date Referred:  04/04/2012 Referred for  SNF Placement  SNF Placement   Other Referral:   Interview type:  Family Other interview type:    PSYCHOSOCIAL DATA Living Status:  FACILITY Admitted from facility:  [facility] Level of care:  Skilled Nursing Facility Primary support name:  [name] Primary support relationship to patient:  CHILD, ADULT Degree of support available:   good    CURRENT CONCERNS Current Concerns  Post-Acute Placement   Other Concerns:    SOCIAL WORK ASSESSMENT / PLAN CSW went to patients room. patient is very lethergic and unable to participate in discussion. CSW called patients daughter, [name], to discuss discharge planning. patient went to [facility] for snf recently. [name] does not want patient to return there. requesting new snf search. csw to follow with bed offers.   Assessment/plan status:   Other assessment/ plan:   Information/referral to community resources:    PATIENTS/FAMILYS RESPONSE TO PLAN OF CARE: daughter agreeable to patient being faxed out.

## 2012-04-04 NOTE — Progress Notes (Signed)
Nutrition Brief Note  Patient identified on the Malnutrition Screening Tool (MST) report for weight loss and decreased intake related to decreased appetite, generating a score of 3.   Body mass index is 26.99 kg/(m^2). Pt weight is WNL based on current BMI.   Wt Readings from Last 10 Encounters:  04/04/12 142 lb 13.7 oz (64.8 kg)  03/31/12 149 lb (67.586 kg)  03/24/12 144 lb 6.4 oz (65.499 kg)  03/20/12 146 lb (66.225 kg)  03/13/12 146 lb (66.225 kg)  02/07/12 149 lb (67.586 kg)  01/19/12 153 lb (69.4 kg)  12/06/11 151 lb 3.8 oz (68.6 kg)  11/25/11 158 lb (71.668 kg)  09/21/11 159 lb (72.122 kg)   Weight loss as noted above.  Stable recently.  Pt with parkinson's and dementia on 2 cal HN at the nursing facility.  Current diet order is Dysphagia 2 thin, patient is consuming approximately 100% of meals at this time. Labs and medications reviewed. Pt receiving Ensure Complete bid and MVI daily  No nutrition interventions warranted at this time. If nutrition issues arise, please consult RD.   Oran Rein, RD, LDN Clinical Inpatient Dietitian Pager:  [phone] Weekend and after hours pager:  [phone]

## 2012-04-04 NOTE — Evaluation (Signed)
Occupational Therapy Evaluation Patient Details Name: [name] MRN: [id number] DOB: March 06, 1946 Today's Date: 04/04/2012 Time: 4098-1191 OT Time Calculation (min): 17 min  OT Assessment / Plan / Recommendation Clinical Impression  This 66 year old female was admitted after a grand mal seizure.  PLOF is unknown.  She was admitted last week and  has had a decrease in function since this admission.  Pt would benefit from continued OT with goals of 1 caregiver assist    OT Assessment  Patient needs continued OT Services    Follow Up Recommendations  Skilled nursing facility    Barriers to Discharge      Equipment Recommendations  Defer to next venue    Recommendations for Other Services    Frequency  Min 2X/week    Precautions / Restrictions Precautions Precautions: Fall Restrictions Weight Bearing Restrictions: No   Pertinent Vitals/Pain No c/o pain    ADL  Eating/Feeding: Simulated;Supervision/safety (due to grogginess) Where Assessed - Eating/Feeding: Chair Grooming: Performed;Wash/dry face;Set up Where Assessed - Grooming: Unsupported sitting (pt used LUE:  propped self with RUE) Upper Body Bathing: Simulated;Moderate assistance Where Assessed - Upper Body Bathing: Supported sitting Lower Body Bathing: Simulated;Maximal assistance Where Assessed - Lower Body Bathing: Supported sit to stand Upper Body Dressing: Performed;Moderate assistance Where Assessed - Upper Body Dressing: Unsupported sitting Lower Body Dressing: Simulated;Maximal assistance Where Assessed - Lower Body Dressing: Supported sit to stand Toilet Transfer: Simulated;+2 Total assistance Toilet Transfer: Patient Percentage: 70% Toilet Transfer Method: Stand pivot (bed to chair to bed) Toileting - Clothing Manipulation and Hygiene: Simulated;Maximal assistance Equipment Used: Rolling walker Transfers/Ambulation Related to ADLs: Pt leaning to R:  used RUE to prop self.  Pt kept eyes closed even  during mobility.  Max cues to open eyes ADL Comments: Slow processing and slow movements, especially with RUE    OT Diagnosis: Generalized weakness  OT Problem List: Decreased strength;Decreased activity tolerance;Impaired balance (sitting and/or standing);Decreased cognition;Decreased safety awareness;Decreased knowledge of use of DME or AE OT Treatment Interventions: Self-care/ADL training;DME and/or AE instruction;Therapeutic activities;Balance training;Patient/family education;Cognitive remediation/compensation   OT Goals Acute Rehab OT Goals OT Goal Formulation: With patient Time For Goal Achievement: 04/18/12 Potential to Achieve Goals: Good ADL Goals Pt Will Perform Grooming: with min assist;Standing at sink;Supported ADL Goal: Grooming - Progress: Goal set today Pt Will Perform Upper Body Bathing: with set-up;Sitting, chair;Supported ADL Goal: Product manager - Progress: Goal set today Pt Will Perform Lower Body Bathing: with mod assist;Sit to stand from chair;Supported ADL Goal: Lower Body Bathing - Progress: Goal set today Pt Will Perform Upper Body Dressing: with min assist;Sitting, chair;Supported ADL Goal: Location manager Dressing - Progress: Goal set today Pt Will Perform Lower Body Dressing: with mod assist;Sit to stand from chair;Supported ADL Goal: Lower Body Dressing - Progress: Goal set today Pt Will Transfer to Toilet: with min assist;Ambulation;3-in-1;with cueing (comment type and amount) (min vcs for walker safety) ADL Goal: Toilet Transfer - Progress: Goal set today Pt Will Perform Toileting - Hygiene: with min assist;Sit to stand from 3-in-1/toilet ADL Goal: Toileting - Hygiene - Progress: Goal set today Arm Goals Additional Arm Goal #1: Pt will perform 2 sets 10 AAROM to RUE shoulder without cues to cotinue to increase strength for adls Arm Goal: Additional Goal #1 - Progress: Goal set today Miscellaneous OT Goals Miscellaneous OT Goal #1: Pt will initiate  activities with only one cue per activity OT Goal: Miscellaneous Goal #1 - Progress: Goal set today  Visit Information  Last  OT Received On: 04/04/12 Assistance Needed: +1 PT/OT Co-Evaluation/Treatment: Yes    Subjective Data  Subjective: Uh huh Patient Stated Goal: none stated:  agreeable to OT/Pt   Prior Functioning  Vision/Perception  Home Living Additional Comments: pt per, she is home with daughter and does all adls by herself.  Per chart, from SNF Prior Function Comments: unknown PLOF Communication Communication: No difficulties (pt very sleepy:  repeated questions at times) Dominant Hand: Right      Cognition  Overall Cognitive Status: Impaired Area of Impairment: Following commands;Safety/judgement;Other (comment) (initiation of activity) Difficult to assess due to:  (sleepy:  info given conflicts) Arousal/Alertness:  (sleepy/kept eyes mostly closed) Orientation Level:  (appears wfls, uncertain about PLOF info) Behavior During Session: [facility] for tasks performed Following Commands: Other (comment) (multiple cues, slow to initiate) Safety/Judgement: Decreased safety judgement for tasks assessed    Extremity/Trunk Assessment Right Upper Extremity Assessment RUE ROM/Strength/Tone: Deficits RUE ROM/Strength/Tone Deficits: very slow movement:  AAROM WFLs, AROM of shoulder limited to about 110 RUE Sensation:  (did not test: pt states wfls) Left Upper Extremity Assessment LUE ROM/Strength/Tone: Within functional levels   Mobility  Shoulder Instructions  Bed Mobility Supine to Sit: 2: Max assist Transfers Sit to Stand: 1: +2 Total assist;From bed;From chair/3-in-1 Sit to Stand: Patient Percentage:  (75%)       Exercise     Balance Static Sitting Balance Static Sitting - Balance Support: Right upper extremity supported Static Sitting - Level of Assistance: 5: Stand by assistance;3: Mod assist (initially mod A then min guard; leans R)   End of Session OT - End of  Session Activity Tolerance: Patient limited by fatigue Patient left: in bed;with call bell/phone within reach;with bed alarm set  GO     Gwendolyn Mclees 04/04/2012, 10:53 AM Marica Otter, OTR/L [phone] 04/04/2012

## 2012-04-04 NOTE — Progress Notes (Signed)
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 female with history of Parkinson's and dementia who was recently admitted to the hospital for urinary tract infection and failure to thrive. While in the hospital she was started on Wellbutrin which was quickly titrated up to help her with her severe depression. She returned from the nursing home with grand mal seizures- reported to gain consciuosness between the episodes. She had another seizure in the emergency room -admitted for further evaluation and management.   Consultants:  none  Procedures:  EEG- pending  Antibiotics:  none  HPI/Subjective: Pt somnolent but easily aroused and oriented. Denies any c/o  Objective: Filed Vitals:   04/03/12 1924 04/03/12 1928 04/04/12 0236 04/04/12 0629  BP: 128/67 135/61 120/67 129/75  Pulse: 80 81 71 74  Temp:  98 F (36.7 C) 98.3 F (36.8 C) 98.4 F (36.9 C)  TempSrc:  Oral Oral Oral  Resp:  20 18 20   Height:   5\' 1"  (1.549 m)   Weight:   64.8 kg (142 lb 13.7 oz)   SpO2:  94% 99% 100%   No intake or output data in the 24 hours ending 04/04/12 1350 Filed Weights   04/04/12 0236  Weight: 64.8 kg (142 lb 13.7 oz)    Exam:   General:  Elderly female in NAD  Cardiovascular: RRR, nl s1s2  Respiratory: clear  Abdomen: soft,+BS,  NT/ND  Data Reviewed: Basic Metabolic Panel:  Lab 04/04/12 8119 04/03/12 1945  NA 135 130*  K 3.6 3.4*  CL 98 94*  CO2 29 28  GLUCOSE 91 116*  BUN 16 21  CREATININE 0.58 0.47*  CALCIUM 9.9 10.1  MG -- --  PHOS -- --   Liver Function Tests:  Lab 04/03/12 1945  AST 44*  ALT 6  ALKPHOS 59  BILITOT 0.5  PROT 6.0  ALBUMIN 3.4*   No results found for this basename: LIPASE:5,AMYLASE:5 in the last 168 hours No results found for this basename: AMMONIA:5 in the last 168 hours CBC:  Lab 04/04/12 0415 04/03/12 1945  WBC 11.8* 11.5*  NEUTROABS -- 8.7*  HGB 12.7 12.7  HCT 38.5 37.8  MCV 94.6 93.6  PLT 246 278   Cardiac Enzymes: No results found for this basename: CKTOTAL:5,CKMB:5,CKMBINDEX:5,TROPONINI:5 in the last 168 hours BNP (last 3 results) No results found for this basename: PROBNP:3 in the last 8760 hours CBG: No results found for this basename: GLUCAP:5 in the last 168 hours  Recent Results (from the past 240 hour(s))  URINE CULTURE     Status: Normal   Collection Time   03/27/12 11:11 AM      Component Value Range Status Comment   Specimen Description URINE, CLEAN CATCH   Final    Special Requests NONE   Final    Culture  Setup Time 03/28/2012 03:06   Final    Colony Count NO GROWTH   Final    Culture NO  GROWTH   Final    Report Status 03/28/2012 FINAL   Final   CULTURE, BLOOD (ROUTINE X 2)     Status: Normal   Collection Time   03/27/12 12:30 PM      Component Value Range Status Comment   Specimen Description BLOOD RIGHT ARM   Final    Special Requests BOTTLES DRAWN AEROBIC AND ANAEROBIC 3CC   Final    Culture  Setup Time 03/27/2012 19:05   Final    Culture     Final    Value: MICROCOCCUS SPECIES     Note: Standardized susceptibility testing for this organism is not available.     3 Note: Gram Stain Report Called to,Read Back By and Verified With: MEREDITH MILLS AT 11:15PM 9 13 BY THOMI   Report Status 03/30/2012 FINAL   Final   CULTURE, BLOOD (ROUTINE X  2)     Status: Normal   Collection Time   03/27/12  1:46 PM      Component Value Range Status Comment   Specimen Description BLOOD RIGHT HAND   Final    Special Requests BOTTLES DRAWN AEROBIC AND ANAEROBIC 5CC   Final    Culture  Setup Time 03/27/2012 19:05   Final    Culture NO GROWTH 5 DAYS   Final    Report Status 04/02/2012 FINAL   Final   MRSA PCR SCREENING     Status: Normal   Collection Time   04/04/12  2:38 AM      Component Value Range Status Comment   MRSA by PCR NEGATIVE  NEGATIVE Final      Studies: Ct Head Wo Contrast  04/03/2012  *RADIOLOGY REPORT*  Clinical Data: Sudden onset of seizure  CT HEAD WITHOUT CONTRAST  Technique:  Contiguous axial images were obtained from the base of the skull through the vertex without contrast.  Comparison: CT brain scan of 03/27/2012  Findings: The ventricular system remains prominent as are the cortical sulci, indicative of diffuse atrophy.  Moderate small vessel ischemic change is noted throughout the periventricular white matter.  No hemorrhage, mass lesion, or acute infarction is seen.  On bone window images, no calvarial abnormality is seen. The paranasal sinuses are clear.  IMPRESSION: Diffuse atrophy and small vessel disease.  No acute intracranial abnormality.   Original Report Authenticated By: Juline Patch, M.D.     Scheduled Meds:   . ALPRAZolam  0.5 mg Oral BID  . aspirin EC  81 mg Oral Daily  . atorvastatin  40 mg Oral q1800  . carbidopa-levodopa  3 tablet Oral TID  . cholecalciferol  1,000 Units Oral Daily  . enoxaparin (LOVENOX) injection  40 mg Subcutaneous Q24H  . feeding supplement  237 mL Oral BID BM  . FLUoxetine  20 mg Oral Daily  . hydrochlorothiazide  12.5 mg Oral Daily  . levETIRAcetam  500 mg Oral BID  . LORazepam      . losartan  50 mg Oral Daily  . multivitamin with minerals  1 tablet Oral Daily  . phenytoin (DILANTIN) IV  1,000 mg Intravenous Once  . polyvinyl alcohol  1 drop Both Eyes Daily  . potassium  chloride  10 mEq Intravenous Q1 Hr x 2  . sodium chloride  3 mL Intravenous Q12H  . sodium chloride  3 mL Intravenous Q12H  . vitamin B-12  1,000 mcg Oral Daily  . 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

## 2012-04-05 ENCOUNTER — Telehealth: Payer: Self-pay

## 2012-04-05 DIAGNOSIS — F3289 Other specified depressive episodes: Secondary | ICD-10-CM

## 2012-04-05 DIAGNOSIS — F329 Major depressive disorder, single episode, unspecified: Secondary | ICD-10-CM

## 2012-04-05 DIAGNOSIS — R1310 Dysphagia, unspecified: Secondary | ICD-10-CM

## 2012-04-05 LAB — GLUCOSE, CAPILLARY: Glucose-Capillary: 174 mg/dL — ABNORMAL HIGH (ref 70–99)

## 2012-04-05 NOTE — Progress Notes (Signed)
EEG shows diffuse beta activity related to medications.  Background activity difficult to evaluate due to predominance of beta activity but no epileptiform activity noted.    Thana Farr, MD [facility] [phone]

## 2012-04-05 NOTE — Progress Notes (Signed)
[facility] PROGRESS NOTE  [name]:[id number] DOB: 18-Dec-1945 DOA: 04/03/2012 PCP: [name], MD  Assessment/Plan: 1-Seizures: appears to be related with wellbutrin. No further seizure; EEG pending. Will continue keppra for now. Neurology consulted.  2-ANXIETY DEPRESSION: continue lexapro and alpraxolam  3-Hypersomnia: post-ictal status, also due to ativan given in ED. Now AAOx2 (baseline); will monitor.  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  6-HLD: continue lipitor  7-Protein calorie malnutrition: continue ensure  8-HTN: stable; continue current regimen (HCTZ and cozaar).   Code Status: DNR Family Communication: daughter at bedside Disposition Plan: SNF   Brief narrative: 66 y.o. female with history of Parkinson's and dementia who was recently admitted to the hospital for urinary tract infection and failure to thrive. While in the hospital she was started on Wellbutrin which was quickly titrated up to help her with her severe depression. She returned from the nursing home with grand mal seizures- reported to gain consciuosness between the episodes. She had another seizure in the emergency room -admitted for further evaluation and management.   Consultants:  Neurology  Procedures:  EEG (pending)  Antibiotics:  none  HPI/Subjective: No further seizure activity reported; patient now less somnolent or lethargic. AAOX2. Afebrile; denies CP or SOB.   Objective: Filed Vitals:   04/04/12 1400 04/04/12 2148 04/05/12 0603 04/05/12 0936  BP: 123/71 106/67 120/77 114/66  Pulse: 70 77 69 72  Temp: 98 F (36.7 C) 98.4 F (36.9 C) 98.6 F (37 C)   TempSrc:  Oral Oral   Resp: 17 18 16    Height:      Weight:      SpO2: 100% 95% 100%     Intake/Output Summary (Last 24 hours) at 04/05/12 1256 Last data filed at 04/04/12 1700  Gross per 24 hour  Intake    230 ml    Output      0 ml  Net    230 ml   Filed Weights   04/04/12 0236  Weight: 64.8 kg (142 lb 13.7 oz)    Exam:   General:  AAOX2; no fever; no acute complaints  Cardiovascular: RRR, S1 and S2  Respiratory: CTA  Abdomen: soft, NT, ND; positive BS  Extremities: no edema, cyanosis or clubbing  Neuro: no new focal deficit appreciated; bell's palsy deviation appreciated and unchanged.  Data Reviewed: Basic Metabolic Panel:  Lab 04/04/12 9562 04/03/12 1945  NA 135 130*  K 3.6 3.4*  CL 98 94*  CO2 29 28  GLUCOSE 91 116*  BUN 16 21  CREATININE 0.58 0.47*  CALCIUM 9.9 10.1  MG -- --  PHOS -- --   Liver Function Tests:  Lab 04/03/12 1945  AST 44*  ALT 6  ALKPHOS 59  BILITOT 0.5  PROT 6.0  ALBUMIN 3.4*   CBC:  Lab 04/04/12 0415 04/03/12 1945  WBC 11.8* 11.5*  NEUTROABS -- 8.7*  HGB 12.7 12.7  HCT 38.5 37.8  MCV 94.6 93.6  PLT 246 278     Recent Results (from the past 240 hour(s))  URINE CULTURE     Status: Normal   Collection Time   03/27/12 11:11 AM      Component Value Range Status Comment   Specimen Description URINE, CLEAN CATCH   Final    Special Requests NONE   Final    Culture  Setup Time 03/28/2012 03:06   Final    Colony Count NO GROWTH   Final  Culture NO GROWTH   Final    Report Status 03/28/2012 FINAL   Final   CULTURE, BLOOD (ROUTINE X 2)     Status: Normal   Collection Time   03/27/12 12:30 PM      Component Value Range Status Comment   Specimen Description BLOOD RIGHT ARM   Final    Special Requests BOTTLES DRAWN AEROBIC AND ANAEROBIC 3CC   Final    Culture  Setup Time 03/27/2012 19:05   Final    Culture     Final    Value: MICROCOCCUS SPECIES     Note: Standardized susceptibility testing for this organism is not available.     3 Note: Gram Stain Report Called to,Read Back By and Verified With: MEREDITH MILLS AT 11:15PM 9 13 BY THOMI   Report Status 03/30/2012 FINAL   Final   CULTURE, BLOOD (ROUTINE X 2)     Status: Normal    Collection Time   03/27/12  1:46 PM      Component Value Range Status Comment   Specimen Description BLOOD RIGHT HAND   Final    Special Requests BOTTLES DRAWN AEROBIC AND ANAEROBIC 5CC   Final    Culture  Setup Time 03/27/2012 19:05   Final    Culture NO GROWTH 5 DAYS   Final    Report Status 04/02/2012 FINAL   Final   MRSA PCR SCREENING     Status: Normal   Collection Time   04/04/12  2:38 AM      Component Value Range Status Comment   MRSA by PCR NEGATIVE  NEGATIVE Final      Studies: Ct Head Wo Contrast  04/03/2012  *RADIOLOGY REPORT*  Clinical Data: Sudden onset of seizure  CT HEAD WITHOUT CONTRAST  Technique:  Contiguous axial images were obtained from the base of the skull through the vertex without contrast.  Comparison: CT brain scan of 03/27/2012  Findings: The ventricular system remains prominent as are the cortical sulci, indicative of diffuse atrophy.  Moderate small vessel ischemic change is noted throughout the periventricular white matter.  No hemorrhage, mass lesion, or acute infarction is seen.  On bone window images, no calvarial abnormality is seen. The paranasal sinuses are clear.  IMPRESSION: Diffuse atrophy and small vessel disease.  No acute intracranial abnormality.   Original Report Authenticated By: Juline Patch, M.D.     Scheduled Meds:   . ALPRAZolam  0.5 mg Oral BID  . aspirin EC  81 mg Oral Daily  . atorvastatin  40 mg Oral q1800  . carbidopa-levodopa  3 tablet Oral TID  . cholecalciferol  1,000 Units Oral Daily  . enoxaparin (LOVENOX) injection  40 mg Subcutaneous Q24H  . feeding supplement  237 mL Oral BID BM  . FLUoxetine  20 mg Oral Daily  . hydrochlorothiazide  12.5 mg Oral Daily  . levETIRAcetam  500 mg Oral BID  . losartan  50 mg Oral Daily  . multivitamin with minerals  1 tablet Oral Daily  . polyvinyl alcohol  1 drop Both Eyes Daily  . sodium chloride  3 mL Intravenous Q12H  . sodium chloride  3 mL Intravenous Q12H  . vitamin B-12  1,000 mcg  Oral Daily    Time spent: > 25 minutes    [name]  [facility] Pager [phone]. If 8PM-8AM, please contact night-coverage at [email or web address], password [facility] 04/05/2012, 12:56 PM  LOS: 2 days

## 2012-04-05 NOTE — Progress Notes (Signed)
CSW called patients daughter and left voicemail. Lakshmi Sundeen C. Coty Larsh MSW, LCSW [phone]

## 2012-04-05 NOTE — Telephone Encounter (Signed)
Phone call from a Dr. Gwenlyn Perking? ([facility]) wanted to discuss [name] with you.  She is currently hospitalized for what they think is a seizure from Wellbutrin and have started her on Keppra.  He can be reached at [phone]

## 2012-04-06 ENCOUNTER — Telehealth: Payer: Self-pay | Admitting: Neurology

## 2012-04-06 ENCOUNTER — Telehealth: Payer: Self-pay

## 2012-04-06 DIAGNOSIS — E782 Mixed hyperlipidemia: Secondary | ICD-10-CM

## 2012-04-06 DIAGNOSIS — F4321 Adjustment disorder with depressed mood: Secondary | ICD-10-CM

## 2012-04-06 LAB — BASIC METABOLIC PANEL
BUN: 16 mg/dL (ref 6–23)
CO2: 32 mEq/L (ref 19–32)
Calcium: 10 mg/dL (ref 8.4–10.5)
Chloride: 95 mEq/L — ABNORMAL LOW (ref 96–112)
Creatinine, Ser: 0.52 mg/dL (ref 0.50–1.10)
GFR calc Af Amer: >90 mL/min (ref >90)
GFR calc non Af Amer: >90 mL/min (ref >90)
Glucose, Bld: 93 mg/dL (ref 70–99)
Potassium: 3.6 mEq/L (ref 3.5–5.1)
Sodium: 134 mEq/L — ABNORMAL LOW (ref 135–145)

## 2012-04-06 MED ORDER — FLUOXETINE HCL 20 MG PO CAPS: 20.0000 mg | ORAL_CAPSULE | Freq: Every day | ORAL | Status: DC

## 2012-04-06 MED ORDER — INFLUENZA VIRUS VACC SPLIT PF IM SUSP
0.5000 mL | INTRAMUSCULAR | Status: AC
  Administered 2012-04-07: 0.5 mL via INTRAMUSCULAR
  Filled 2012-04-06: qty 0.5

## 2012-04-06 MED ORDER — MIRTAZAPINE 15 MG PO TABS
15.0000 mg | ORAL_TABLET | Freq: Every day | ORAL | Status: DC
  Administered 2012-04-06: 15 mg via ORAL
  Filled 2012-04-06 (×2): qty 1

## 2012-04-06 MED ORDER — LEVETIRACETAM 500 MG PO TABS
500.0000 mg | ORAL_TABLET | Freq: Two times a day (BID) | ORAL | Status: DC
  Administered 2012-04-06 – 2012-04-07 (×2): 500 mg via ORAL
  Filled 2012-04-06 (×3): qty 1

## 2012-04-06 NOTE — Telephone Encounter (Signed)
Patient was admitted for "seizures" after what is thought to be an overaggressive wellbutrin titration.  Wellbutrin now stopped.  She is going to call and follow up with Dr. Arbutus Leas in about 1 month, but continue on the Keppra that was just started for now.

## 2012-04-06 NOTE — Telephone Encounter (Signed)
[facility] left v/m requesting clarification on pts current therapy;refill request Fluoxetine 20 mg  And pt also has Bupropion XL 150 mg from another physician.Please advise.

## 2012-04-06 NOTE — Progress Notes (Signed)
Physical Therapy Treatment Patient Details Name: [name] MRN: [id number] DOB: 1945/10/27 Today's Date: 04/06/2012 Time: 1478-2956 PT Time Calculation (min): 14 min  PT Assessment / Plan / Recommendation Comments on Treatment Session  Pt ambulation is improved from previous visit.  She still requires some assist and would not be ready to d/c to home alone.  Recommend continued PT at SNF    Follow Up Recommendations  Skilled nursing facility    Barriers to Discharge        Equipment Recommendations  Defer to next venue    Recommendations for Other Services    Frequency Min 3X/week   Plan Discharge plan remains appropriate;Frequency remains appropriate    Precautions / Restrictions     Pertinent Vitals/Pain Pt with not c/o pain    Mobility  Bed Mobility Bed Mobility: Supine to Sit Sit to Supine: 4: Min assist Sit to Sidelying Right: 2: Max assist Sit to Sidelying Left: 2: Max assist Details for Bed Mobility Assistance: Multimodal cues for safety, technique, hand placement, initiation of task. Assist for trunk to upright/sidelying and for bil LEoff/onto bed.  Transfers Transfers: Sit to Stand;Stand to Sit Sit to Stand: From bed;With upper extremity assist;4: Min assist;From chair/3-in-1 Stand to Sit: With upper extremity assist;To chair/3-in-1;4: Min assist Details for Transfer Assistance: x 5. Multimodal cues for safety, technique, hand placement. Assist to rise, stabilize, control descent.  Ambulation/Gait Ambulation/Gait Assistance: 4: Min assist Ambulation Distance (Feet): 150 Feet Assistive device: Rolling walker Ambulation/Gait Assistance Details: Assistance for balance on turns Gait velocity: decreased General Gait Details: able to control erect posture, and doing better when walking forward on unobstructed path.  Pt does have decreased balance with change in direction and turns Stairs: No Wheelchair Mobility Wheelchair Mobility: No    Exercises     PT  Diagnosis:    PT Problem List:   PT Treatment Interventions:     PT Goals Acute Rehab PT Goals PT Goal: Supine/Side to Sit - Progress: Progressing toward goal PT Goal: Sit to Stand - Progress: Progressing toward goal PT Goal: Stand to Sit - Progress: Progressing toward goal PT Goal: Ambulate - Progress: Progressing toward goal  Visit Information  Last PT Received On: 04/06/12 Assistance Needed: +1    Subjective Data  Subjective: I'm waiting for my Parkinson's medicine Patient Stated Goal: to go to a different nursing home   Cognition  Overall Cognitive Status: Impaired Area of Impairment: Following commands;Safety/judgement;Awareness of errors;Problem solving Arousal/Alertness: Awake/alert Orientation Level: Appears intact for tasks assessed Behavior During Session: [facility] for tasks performed Following Commands: Follows one step commands consistently    Balance  Balance Balance Assessed: Yes Static Sitting Balance Static Sitting - Balance Support: Feet supported;No upper extremity supported Static Sitting - Level of Assistance: 5: Stand by assistance Static Sitting - Comment/# of Minutes: 3 Static Standing Balance Static Standing - Balance Support: Bilateral upper extremity supported;During functional activity Static Standing - Level of Assistance: 5: Stand by assistance Static Standing - Comment/# of Minutes: 3  End of Session PT - End of Session Equipment Utilized During Treatment: Gait belt Activity Tolerance: Patient tolerated treatment well Patient left: in chair;with call bell/phone within reach Nurse Communication: Mobility status   GP    Rosey Bath K. Brady, [place] [phone] 04/06/2012, 1:56 PM

## 2012-04-06 NOTE — Progress Notes (Signed)
[facility] PROGRESS NOTE  [name] ION:[id number] DOB: 1945/11/12 DOA: 04/03/2012 PCP: [name], MD  Assessment/Plan: 1-Seizures: appears to be related with wellbutrin. No further seizure; EEG without underlying seizure disorder. After discussing with Dr. Modesto Charon recommendations were to continue patient on keppra until she follow with him at the office. Doubt she will require long term antiepileptic medications.  2-ANXIETY DEPRESSION: continue lexapro, remeron and alpraxolam. stable  3-Hypersomnia: post-ictal status, also due to ativan given in ED. Now AAOx2 (baseline); will continue monitoring.  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  6-HLD: continue lipitor  7-Protein calorie malnutrition: continue ensure  8-HTN: stable; continue current regimen (HCTZ and cozaar).   Code Status: DNR Family Communication: daughter at bedside Disposition Plan: SNF   Brief narrative: 66 y.o. female with history of Parkinson's and dementia who was recently admitted to the hospital for urinary tract infection and failure to thrive. While in the hospital she was started on Wellbutrin which was quickly titrated up to help her with her severe depression. She returned from the nursing home with grand mal seizures- reported to gain consciuosness between the episodes. She had another seizure in the emergency room -admitted for further evaluation and management.   Consultants:  Neurology  Procedures:  EEG (no underlying seizure activity appreciated)  Antibiotics:  none  HPI/Subjective: No further seizure activity reported; patient now with mental status back to baseline. AAOX2; Afebrile; denies CP or SOB.   Objective: Filed Vitals:   04/05/12 2054 04/05/12 2215 04/06/12 0505 04/06/12 1402  BP: 112/71 192/58 114/70 116/45  Pulse: 96 78 66 75  Temp: 98.7 F (37.1 C) 97.9 F (36.6 C)  98.3 F (36.8 C) 98 F (36.7 C)  TempSrc: Oral Oral Oral Oral  Resp: 16 14 13 18   Height:  5\' 1"  (1.549 m)    Weight:  68.856 kg (151 lb 12.8 oz)    SpO2: 96% 97% 99% 98%    Intake/Output Summary (Last 24 hours) at 04/06/12 1833 Last data filed at 04/06/12 1453  Gross per 24 hour  Intake    480 ml  Output      0 ml  Net    480 ml   Filed Weights   04/04/12 0236 04/05/12 2215  Weight: 64.8 kg (142 lb 13.7 oz) 68.856 kg (151 lb 12.8 oz)    Exam:   General:  AAOX2 (Ms at baseline); no fever; no acute complaints  Cardiovascular: RRR, S1 and S2  Respiratory: CTA  Abdomen: soft, NT, ND; positive BS  Extremities: no edema, cyanosis or clubbing  Neuro: no new focal deficit appreciated; bell's palsy deviation appreciated and unchanged.  Data Reviewed: Basic Metabolic Panel:  Lab 04/06/12 2440 04/04/12 0415 04/03/12 1945  NA 134* 135 130*  K 3.6 3.6 3.4*  CL 95* 98 94*  CO2 32 29 28  GLUCOSE 93 91 116*  BUN 16 16 21   CREATININE 0.52 0.58 0.47*  CALCIUM 10.0 9.9 10.1  MG -- -- --  PHOS -- -- --   Liver Function Tests:  Lab 04/03/12 1945  AST 44*  ALT 6  ALKPHOS 59  BILITOT 0.5  PROT 6.0  ALBUMIN 3.4*   CBC:  Lab 04/04/12 0415 04/03/12 1945  WBC 11.8* 11.5*  NEUTROABS -- 8.7*  HGB 12.7 12.7  HCT 38.5 37.8  MCV 94.6 93.6  PLT 246 278     Recent Results (from the past 240 hour(s))  MRSA  PCR SCREENING     Status: Normal   Collection Time   04/04/12  2:38 AM      Component Value Range Status Comment   MRSA by PCR NEGATIVE  NEGATIVE Final      Studies: No results found.  Scheduled Meds:    . ALPRAZolam  0.5 mg Oral BID  . aspirin EC  81 mg Oral Daily  . atorvastatin  40 mg Oral q1800  . carbidopa-levodopa  3 tablet Oral TID  . cholecalciferol  1,000 Units Oral Daily  . enoxaparin (LOVENOX) injection  40 mg Subcutaneous Q24H  . feeding supplement  237 mL Oral BID BM  . FLUoxetine  20 mg Oral Daily  . hydrochlorothiazide  12.5 mg Oral Daily    . levETIRAcetam  500 mg Oral BID  . losartan  50 mg Oral Daily  . mirtazapine  15 mg Oral QHS  . multivitamin with minerals  1 tablet Oral Daily  . polyvinyl alcohol  1 drop Both Eyes Daily  . sodium chloride  3 mL Intravenous Q12H  . sodium chloride  3 mL Intravenous Q12H  . vitamin B-12  1,000 mcg Oral Daily  . DISCONTD: levETIRAcetam  500 mg Oral BID    Time spent: > 30 minutes    [name]  [facility] Pager [phone]. If 8PM-8AM, please contact night-coverage at [email or web address], password [facility] 04/06/2012, 6:33 PM  LOS: 3 days

## 2012-04-06 NOTE — Progress Notes (Signed)
Message left for daughter to provide SNF bed offers- await callback for bed selection- ?d/c soon. Reece Levy, MSW, [facility] [phone]

## 2012-04-06 NOTE — Telephone Encounter (Signed)
See refill request note.  We need to call patient's daughter to find out what she was taking as she was hospitalized this week and it appears that her wellbutrin was stopped due to seizure activity.

## 2012-04-06 NOTE — Telephone Encounter (Signed)
spoke to [place].  Note in system.

## 2012-04-06 NOTE — Telephone Encounter (Signed)
Spoke with patient's daughter.  Pt is no longer taking wellbutrin, she is still taking prozac.  Will send prozac to pharmacy.

## 2012-04-06 NOTE — Telephone Encounter (Signed)
Noted! Thank you

## 2012-04-07 MED ORDER — MIRTAZAPINE 15 MG PO TABS: 15.0000 mg | ORAL_TABLET | Freq: Every day | ORAL | Status: DC

## 2012-04-07 MED ORDER — LEVETIRACETAM 500 MG PO TABS: 500.0000 mg | ORAL_TABLET | Freq: Two times a day (BID) | ORAL | Status: DC

## 2012-04-07 MED ORDER — ALPRAZOLAM 0.5 MG PO TABS: 0.5000 mg | ORAL_TABLET | Freq: Two times a day (BID) | ORAL | Status: DC

## 2012-04-07 NOTE — Discharge Summary (Signed)
Physician Discharge Summary  [name]:[id number] DOB: [birth year] DOA: 04/03/2012  PCP: [name], MD  Admit date: 04/03/2012 Discharge date: 04/07/2012  Recommendations for Outpatient Follow-up:  1. PCP follow up in 2 weeks (Follow patient mood disorder and adjust medications as needed. Good response inside the hospital to current regimen).   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 Take medications as prescribed. Keep yourself well hydrated and follow with Dr. Modesto Charon and PCP as instructed.  Diet recommendation: heart healthy diet and dysphagia type 2  Filed Weights   04/04/12 0236 04/05/12 2215  Weight: 64.8 kg (142 lb 13.7 oz) 68.856 kg (151 lb 12.8 oz)    History of present illness:  66 y.o. female with history of Parkinson's and dementia who was recently admitted to the hospital for urinary tract infection and failure to thrive. While in the hospital she was started on Wellbutrin which was quickly titrated up to help her with her severe depression. She returned from the nursing home with grand mal seizures- reported to gain consciuosness between the episodes. She had another seizure in the emergency room -admitted for further evaluation and management.   Hospital Course:  1-Seizures: appears to be related with wellbutrin. No further seizure; EEG without underlying seizure disorder. After discussing with Dr. Modesto Charon (patient's neurologist), his recommendations were to continue patient on keppra until she follow with him at the office. Doubt she will require long term antiepileptic medications.   2-ANXIETY DEPRESSION: continue lexapro, remeron and alpraxolam. Stable at this point. No SI or hallucinations.  3-Hypersomnia: most likely 2/2 post-ictal status, also due to ativan given in ED with her seizure activity; after  2 days mental status sterted improving and patient becoming more alert. At the moment of discharge mentation was back to baseline (AAOx2).   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   6-HLD: continue lipitor   7-Protein calorie malnutrition: continue ensure   8-HTN: stable; continue current regimen (HCTZ and cozaar).   Procedures: EEG (no underlying seizure activity appreciated)  Consultations: neurology  Discharge Exam: Filed Vitals:   04/06/12 1402 04/06/12 2010 04/06/12 2200 04/07/12 0643  BP: 116/45  97/44 119/57  Pulse: 75 82 72 64  Temp: 98 F (36.7 C)  97.5 F (36.4 C) 98.1 F (36.7 C)  TempSrc: Oral  Oral Oral  Resp: 18  16 16   Height:      Weight:      SpO2: 98%  98% 100%   General: AAOX2 (Ms at baseline); no fever; no acute complaints  Cardiovascular: RRR, S1 and S2  Respiratory: CTA  Abdomen: soft, NT, ND; positive BS  Extremities: no edema, cyanosis or clubbing  Neuro: no new focal deficit appreciated; bell's palsy deviation appreciated and unchanged.    Discharge Instructions  Discharge Orders    Future Orders Please Complete By Expires   Diet - low sodium heart healthy      Discharge instructions      Comments:   -Arrange follow up with PCP in 2 weeks -Arrange follow up with Dr. Modesto Charon neurologist -Take medications as prescribed -Keep yourself well hydrated       Medication List     As of 04/07/2012 11:49 AM    STOP taking these medications         buPROPion 150  MG 24 hr tablet   Commonly known as: WELLBUTRIN XL      TAKE these medications         acetaminophen 325 MG tablet   Commonly known as: TYLENOL   Take 2 tablets (650 mg total) by mouth every 6 (six) hours as needed for pain or fever (or Fever >/= 101).      albuterol (5 MG/ML) 0.5% nebulizer solution   Commonly known as: PROVENTIL   Take 0.5 mLs (2.5 mg total) by nebulization every  6 (six) hours as needed for wheezing or shortness of breath.      ALPRAZolam 0.5 MG tablet   Commonly known as: XANAX   Take 1 tablet (0.5 mg total) by mouth 2 (two) times daily.      aspirin 81 MG tablet   Take 81 mg by mouth daily.      carbidopa-levodopa 25-100 MG per tablet   Commonly known as: SINEMET IR   Take 3 tablets by mouth 3 (three) times daily.      feeding supplement Liqd   Take 237 mLs by mouth 2 (two) times daily between meals.      FLUoxetine 20 MG capsule   Commonly known as: PROZAC   Take 1 capsule (20 mg total) by mouth daily.      hydroxypropyl methylcellulose 2.5 % ophthalmic solution   Commonly known as: ISOPTO TEARS   Place 1 drop into both eyes daily.      levETIRAcetam 500 MG tablet   Commonly known as: KEPPRA   Take 1 tablet (500 mg total) by mouth 2 (two) times daily.      losartan-hydrochlorothiazide 50-12.5 MG per tablet   Commonly known as: HYZAAR   Take 1 tablet by mouth daily with breakfast.      mirtazapine 15 MG tablet   Commonly known as: REMERON   Take 1 tablet (15 mg total) by mouth at bedtime.      multivitamin with minerals Tabs   Take 1 tablet by mouth daily.      simvastatin 80 MG tablet   Commonly known as: ZOCOR   Take 80 mg by mouth at bedtime.      vitamin B-12 1000 MCG tablet   Commonly known as: CYANOCOBALAMIN   Take 1,000 mcg by mouth daily.      Vitamin D3 1000 UNITS Caps   Take 1 capsule by mouth daily.           Follow-up Information    Follow up with [name], MD. In 2 weeks.   Contact information:   [address] [place] [address] [place] [address] [phone]       Schedule an appointment as soon as possible for a visit with [name], MD.   Contact information:   [address] [facility] [place] [address] [phone]           The results of significant diagnostics from this hospitalization (including imaging, microbiology, ancillary and laboratory) are  listed below for reference.    Significant Diagnostic Studies: Dg Chest 2 View  03/27/2012  *RADIOLOGY REPORT*  Clinical Data: Hallucinations.  Medication change 2 days ago.  CHEST - 2 VIEW  Comparison: 03/23/2012.  Findings:  Cardiopericardial silhouette within normal limits. Mediastinal contours normal. Trachea midline.  No airspace disease or effusion.  Gastric band appears unchanged.  IMPRESSION: No active cardiopulmonary disease.   Original Report Authenticated By: Andreas Newport, M.D.    Dg Chest  2 View  03/23/2012  *RADIOLOGY REPORT*  Clinical Data: Shortness of breath, weakness.  CHEST - 2 VIEW  Comparison: 01/31/2012  Findings: Heart size upper normal.  Mild aortic tortuosity. Mediastinal contours otherwise within normal range. Lungs are essentially clear.  No pleural effusion or pneumothorax. Multilevel degenerative change.  No acute osseous finding.  Gastric band.  IMPRESSION: No radiographic evidence of acute cardiopulmonary process.   Original Report Authenticated By: Waneta Martins, M.D.    Ct Head Wo Contrast  04/03/2012  *RADIOLOGY REPORT*  Clinical Data: Sudden onset of seizure  CT HEAD WITHOUT CONTRAST  Technique:  Contiguous axial images were obtained from the base of the skull through the vertex without contrast.  Comparison: CT brain scan of 03/27/2012  Findings: The ventricular system remains prominent as are the cortical sulci, indicative of diffuse atrophy.  Moderate small vessel ischemic change is noted throughout the periventricular white matter.  No hemorrhage, mass lesion, or acute infarction is seen.  On bone window images, no calvarial abnormality is seen. The paranasal sinuses are clear.  IMPRESSION: Diffuse atrophy and small vessel disease.  No acute intracranial abnormality.   Original Report Authenticated By: Juline Patch, M.D.    Ct Head Wo Contrast  03/27/2012  *RADIOLOGY REPORT*  Clinical Data: Hallucinations.  CT HEAD WITHOUT CONTRAST  Technique:  Contiguous axial  images were obtained from the base of the skull through the vertex without contrast.  Comparison: Multiple prior unenhanced cranial CT examinations dating back to 11/11/2007, most recently 01/31/2012.  Findings: Moderate to severe cortical and deep atrophy, unchanged. Mild to moderate changes of small vessel disease of the white matter diffusely, unchanged.  No mass lesion.  No midline shift. No acute hemorrhage or hematoma.  No extra-axial fluid collections. No evidence of acute infarction.  No significant interval change.  Minimal changes of hyperostosis frontalis interna. Visualized paranasal sinuses, bilateral mastoid air cells, and bilateral middle ear cavities well-aerated.  Bilateral carotid siphon atherosclerosis.  IMPRESSION:  1.  No acute intracranial abnormality. 2.  Stable moderate to severe generalized atrophy and mild to moderate chronic microvascular ischemic changes of the white matter.   Original Report Authenticated By: Arnell Sieving, M.D.     Microbiology: Recent Results (from the past 240 hour(s))  MRSA PCR SCREENING     Status: Normal   Collection Time   04/04/12  2:38 AM      Component Value Range Status Comment   MRSA by PCR NEGATIVE  NEGATIVE Final      Labs: Basic Metabolic Panel:  Lab 04/06/12 1610 04/04/12 0415 04/03/12 1945  NA 134* 135 130*  K 3.6 3.6 3.4*  CL 95* 98 94*  CO2 32 29 28  GLUCOSE 93 91 116*  BUN 16 16 21   CREATININE 0.52 0.58 0.47*  CALCIUM 10.0 9.9 10.1  MG -- -- --  PHOS -- -- --   Liver Function Tests:  Lab 04/03/12 1945  AST 44*  ALT 6  ALKPHOS 59  BILITOT 0.5  PROT 6.0  ALBUMIN 3.4*   CBC:  Lab 04/04/12 0415 04/03/12 1945  WBC 11.8* 11.5*  NEUTROABS -- 8.7*  HGB 12.7 12.7  HCT 38.5 37.8  MCV 94.6 93.6  PLT 246 278   CBG:  Lab 04/05/12 2040  GLUCAP 174*    Time coordinating discharge: > 30 minutes  Signed:  Vivaan Helseth  [facility] 04/07/2012, 11:49 AM

## 2012-04-07 NOTE — Progress Notes (Signed)
Patient for d/c today to SNF bed at [facility]. Patient agreeable to this plan- plan transfer via EMS later today. Reece Levy, MSW, [facility] [phone]

## 2012-04-07 NOTE — Procedures (Signed)
EEG NUMBER:  REFERRING PHYSICIAN:  Lonia Blood, M.D.  HISTORY:  A 66 year old female with seizures.  MEDICATIONS:  Dilantin, Xanax, aspirin, Lipitor, Sinemet, Lovenox, Prozac, hydrochlorothiazide, Hyzaar, multivitamins, and potassium.  CONDITIONS OF RECORDING:  This is a 16-channel EEG carried out with the patient in the lethargic state.  DESCRIPTION:  The background activity consists of diffusely distributed beta activity that is seen over all quadrants and is persistent throughout the tracing.  At times, it is well organized and does appear to be a medication effect.  No epileptiform activity is noted.  No subclinical seizure activity is noted.  No stage II sleep is noted either.  Intermittent photic stimulation was performed.  There was some driving response that was noted bilaterally and symmetrically.  No abnormalities were elicited though.  Hyperventilation was not performed.  IMPRESSION:  This EEG is dominated by medication effect.  There is no evidence of epileptiform activity seen during the tracing.          ______________________________ Thana Farr, MD    [id number] D:  04/06/2012 08:47:17  T:  04/07/2012 03:08:03  Job #:  [id number]

## 2012-04-07 NOTE — Progress Notes (Signed)
Report called to Olegario Messier, Charity fundraiser at [facility]

## 2012-04-10 ENCOUNTER — Ambulatory Visit: Payer: 59 | Admitting: Neurology

## 2012-04-11 ENCOUNTER — Encounter: Payer: Self-pay | Admitting: Neurology

## 2012-04-11 ENCOUNTER — Ambulatory Visit (INDEPENDENT_AMBULATORY_CARE_PROVIDER_SITE_OTHER): Payer: 59 | Admitting: Neurology

## 2012-04-11 ENCOUNTER — Other Ambulatory Visit: Payer: Self-pay | Admitting: Neurology

## 2012-04-11 VITALS — BP 114/72 | HR 82 | Resp 16 | Wt 148.0 lb

## 2012-04-11 DIAGNOSIS — F32A Depression, unspecified: Secondary | ICD-10-CM

## 2012-04-11 DIAGNOSIS — F3289 Other specified depressive episodes: Secondary | ICD-10-CM

## 2012-04-11 DIAGNOSIS — G219 Secondary parkinsonism, unspecified: Secondary | ICD-10-CM

## 2012-04-11 DIAGNOSIS — R569 Unspecified convulsions: Secondary | ICD-10-CM | POA: Insufficient documentation

## 2012-04-11 DIAGNOSIS — F329 Major depressive disorder, single episode, unspecified: Secondary | ICD-10-CM

## 2012-04-11 MED ORDER — MIRTAZAPINE 15 MG PO TABS: 15.0000 mg | ORAL_TABLET | Freq: Every day | ORAL | Status: DC

## 2012-04-11 NOTE — Patient Instructions (Addendum)
1.  Decrease Keppra to one time per day (500 mg at night) 2.  OT home safety evaluation upon discharge 3.  We will refer you to psychology 4.  We will set up a swallow evaluation 5.  Follow up in 8 weeks---06/06/12 @ 2:45 pm

## 2012-04-11 NOTE — Progress Notes (Signed)
[name] was seen today in the movement disorders clinic for neurologic consultation at the request of [name], MD and [name].  The consultation is for the evaluation of Parkinsonism.   I had the opportunity to review the patients previous medical records.  She is accompanied by her daughter who helps to supplement the history.  The patient had hx of Bells palsy when 66 y/o and fully recovered but she had it again in 1985 and it affected the L side of her face and she did not completely recover.    Chief Complaint: The patient's primary symptom is balance/gait instability.  Specific Symptoms:  Tremor: Was first symptom, and began 5-4 years ago.  Tremor started in both hands equally.  Tremor got slowly worse.  Tremor is never at rest.  Falls occurred at same time (initial presentation).  Had syncopal episodes in the beginning but seems to occur rarely now.  There is a question of whether these were true syncope or just falls, but sounds like most were falls with few episodes syncope in past. Voice: softer with time, better recently Sleep: EDS  Vivid Dreams:  no  Acting out dreams:  Yes per daughter, patient unsure.  Pts daughter states that if she sleeps with pt, the patient will kick/jerk but no sleep walking or talking Wet Pillows: yes, attributes to bells palsy hx. Postural symptoms:  Poor balance  Falls?  3 weeks ago.  Falls were daily, but now in rehab and no falls for 3 weeks.  Usually a fall is due to bending over.  Bradykinesia symptoms: yes, slow walking Loss of smell:  Was poor until 2 weeks ago, but now can smell Loss of taste:  Was poor until 2 weeks ago, but now can taste Urinary Incontinence:  Yes, because not can get to the bathroom fast enough.  Wears depends Difficulty Swallowing:  No, but seems to "choke" on phlegm on daily basis, not any particular time of day Handwriting, micrographia: yes, but doing some therapy at home with daughter and improving Trouble with  ADL's:  Trouble buttoning clothing  Trouble buttoning clothing:  yes Depression:  Yes but not suicidal/homicidal.  Would like to talk with psychologist. Memory changes:  Some short term memory loss since death of husband 5 years ago.  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  Neuroimaging has previously been performed.  It was available for my review today.  It was done on 04/23/11 and demonstrated significant atrophy especially in the temporal regions and moderate small vessel disease.  Gad was not given.  Since last visit the patient went to her psychiatrist and was placed on Wellbutrin.  Two days after this was started, her daughter noted poor motivation.  The following day she had hallucinations. She believed she saw a gun and her daughter was going to kill her.  She was seeing deceased family members and did not realize her husband passed away (5 years ago).  She became paranoid.  The patient does not remember any of this.  911 was contacted, and she was admitted to [facility] on 9/3.  In the hospital, her Wellbutrin was increased from 150 mg to 450 mg and she stopped hallucinating and felt good.   For some reason, the following day her Wellbutrin was decreased back to 150 mg and she was d/c to a SNF for rehab.  While in rehab on about sept 9 or 10, she had  a seizure.  This was described as stiffness and drooling.  There was no tremor.  There was no loss of bladder control.  There was no tongue biting.  After about 30-45 minutes, she had come almost back to baseline where she could recognize her daughter.  She went back to the hospital and she had a repeat event at the hospital and she was started on keppra and the wellbutrin was d/c. On sept 13, 2013, she was d/c to another SNF and started rehab yesterday.  Her daughter reports that mood is bed.  There was only one episode prior to this of hallucination and that was this year and she  remembers the entire event.  She is on xanax, 0.5 mg twice per day.  She has been on that for years, and has never stopped that medication.  The patient is on Carbidopa/levodopa and has been on it 25/100, 3 po tid.  They were not sure if it was helping, they attempted to wean it and walking got worse.  The patient works still (currently on short term disability).  She works full Energy manager money to accounts.  She would like to go back to work.   PREVIOUS MEDICATIONS: Sinemet  ALLERGIES:   Allergies  Allergen Reactions  . Codeine Sulfate     REACTION: hallucinations  . Penicillins Anaphylaxis, Swelling and Rash  . Wellbutrin (Bupropion) Other (See Comments)    Seizures and hallucinations    CURRENT MEDICATIONS:  Current Outpatient Prescriptions on File Prior to Visit  Medication Sig Dispense Refill  . acetaminophen (TYLENOL) 325 MG tablet Take 2 tablets (650 mg total) by mouth every 6 (six) hours as needed for pain or fever (or Fever >/= 101).  30 tablet  0  . ALPRAZolam (XANAX) 0.5 MG tablet Take 1 tablet (0.5 mg total) by mouth 2 (two) times daily.  30 tablet  0  . aspirin 81 MG tablet Take 81 mg by mouth daily.       . carbidopa-levodopa (SINEMET IR) 25-100 MG per tablet Take 3 tablets by mouth 3 (three) times daily.      . Cholecalciferol (VITAMIN D3) 1000 UNITS CAPS Take 1 capsule by mouth daily.        . feeding supplement (ENSURE COMPLETE) LIQD Take 237 mLs by mouth 2 (two) times daily between meals.  1 Bottle  11  . FLUoxetine (PROZAC) 20 MG capsule Take 1 capsule (20 mg total) by mouth daily.  90 capsule  1  . hydroxypropyl methylcellulose (ISOPTO TEARS) 2.5 % ophthalmic solution Place 1 drop into both eyes daily.      Marland Kitchen levETIRAcetam (KEPPRA) 500 MG tablet Take 1 tablet (500 mg total) by mouth 2 (two) times daily.      Marland Kitchen losartan-hydrochlorothiazide (HYZAAR) 50-12.5 MG per tablet Take 1 tablet by mouth daily with breakfast.       . Multiple Vitamin (MULTIVITAMIN WITH  MINERALS) TABS Take 1 tablet by mouth daily.      . simvastatin (ZOCOR) 80 MG tablet Take 80 mg by mouth at bedtime.      . vitamin B-12 (CYANOCOBALAMIN) 1000 MCG tablet Take 1,000 mcg by mouth daily.        Marland Kitchen DISCONTD: mirtazapine (REMERON) 15 MG tablet Take 1 tablet (15 mg total) by mouth at bedtime.      Marland Kitchen albuterol (PROVENTIL) (5 MG/ML) 0.5% nebulizer solution Take 0.5 mLs (2.5 mg total) by nebulization every 6 (six) hours as needed for wheezing or shortness of breath.  20 mL  0    PAST MEDICAL HISTORY:   Past Medical History  Diagnosis Date  . Dysmetabolic syndrome X   . Personal history of thrombophlebitis   . Essential hypertension, benign   . Morbid obesity   . Mixed hyperlipidemia   . Depressive disorder, not elsewhere classified   . Bell's palsy     1985  . Adjustment disorder with depressed mood   . DVT (deep venous thrombosis), left "early 2000's"    RLE  . Syncope and collapse 12/06/11    "this am; I pass out fairly often"  . Type II or unspecified type diabetes mellitus without mention of complication, not stated as uncontrolled 12/06/11    "not anymore; had lap band OR"  . Neurodegenerative gait disorder   . Headache 05/2011    "pretty often since they put me on Parkinson's medicine"  . Parkinson disease     "they think that's what it is"  . DEMENTIA   . 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 Anxiety   . History of shingles     "as a teen and in my 57's"    PAST SURGICAL HISTORY:   Past Surgical History  Procedure Date  . Tonsillectomy and adenoidectomy 1970  . Bladder tack 11/1995  . Septoplasty 08/1998    with antral window   . Knee arthroscopy 12/12/2002    right   . Fracture surgery 02/09/2006    bilateral elbows  . Fracture surgery     right knee  . Fracture surgery     tib plateau  . Breast cyst aspiration 12/1998    bilaterally  . Breast biopsy 05/20/2000    bilaterally  . Tubal ligation 1970's  . Cataract  extraction w/ intraocular lens  implant, bilateral   . Laparoscopic gastric banding 2008  . Cardiac catheterization 2004    SOCIAL HISTORY:   History   Social History  . Marital Status: Widowed    Spouse Name: N/A    Number of Children: 2  . Years of Education: N/A   Occupational History  . ACCOUNT CLERK [employer]   Social History Main Topics  . Smoking status: Never Smoker   . Smokeless tobacco: Never Used  . Alcohol Use: No     none  . Drug Use: No  . Sexually Active: No   Other Topics Concern  . Not on file   Social History Narrative   Lives with daughter and granddaughter    FAMILY HISTORY:   Family Status  Relation Status Death Age  . Mother Deceased 31    natural causes  . Father Deceased 84    MI  . Sister Alive     DM-2  . Child Alive     2, HTN, DM-2, hyperlipidemia    ROS:  A complete 10 system review of systems was obtained and was unremarkable apart from what is mentioned above.  PHYSICAL EXAMINATION:    VITALS:   Filed Vitals:   04/11/12 1000 04/11/12 1217 04/11/12 1218 04/11/12 1219  BP: 146/80 124/74 118/74 114/72  Pulse: 82 80 80 82  Resp: 16     Weight: 148 lb (67.132 kg)      ORTHOSTATICS: 04/11/2012 were negative.   GEN:  The patient appears stated age and is in NAD. HEENT:  Normocephalic, atraumatic.  The mucous membranes are moist. The superficial temporal arteries are without ropiness or tenderness. CV:  RRR Lungs:  CTAB Neck/HEME:  There are no carotid bruits bilaterally.  Neurological examination:  Orientation: A MoCA was performed and she scored a 25/30. Cranial nerves: There is L facial droop with decreased forehead wrinkle on the L.  She has a positive stellwags sign. Pupils are equal round and reactive to light bilaterally. Fundoscopic exam reveals clear margins bilaterally. Extraocular muscles are intact but there is some upgaze paresis.   There are square wave jerks and difficulty with smooth pursuit.  The visual  fields are full to confrontational testing. The speech is hypophonic but fluent and clear. She has no trouble repeating gutteral sounds.  Soft palate rises symmetrically and there is no tongue deviation. Hearing is intact to conversational tone. Sensation: Sensation is intact to light and pinprick throughout (facial, trunk, extremities). Vibration is intact at the bilateral big toe. There is no extinction with double simultaneous stimulation. There is no sensory dermatomal level identified. Motor: Strength is 5/5 in the bilateral upper and lower extremities.   Shoulder shrug is equal and symmetric.  There is no pronator drift. Deep tendon reflexes: Deep tendon reflexes are 3/4 at the bilateral biceps, triceps, brachioradialis, and achilles. There are cross adductor reflexes at the knees bilaterally.  Plantar responses are upgoing bilaterally.  Movement examination: Tone: There is normal tone in the bilateral upper extremities.  The tone in the lower extremities is normal.  Abnormal movements: none.  No tremor could be elicited even with distraction techniques Coordination:  There is decremation with RAM's, including heel taps bilaterally, finger taps and toe taps Gait and Station: The patient has some difficulty arising out of a deep-seated chair without the use of the hands but is able to arise on trial 2. The patient's stride length is decreased.  There is postural instability with a short stepped gait and decreased arm swing.  The patient has a positive pull test.      ASSESSMENT:  1.  Parkinsonism.  This is the first time that I am seeing the patient, but I think that PSP needs to be high on the list of Ddx.  Her primary issue is gait instability with slowness of movement.  While she does have some memory loss, this is not a prominent feature.  I believe that FTD and LBD are lower on the list of Ddx, given the relative lack of personality changes per family.  The hallucinations were likely medication  induced. 2.  New onset seizure.  This is likely secondary to the initiation of wellbutrin.  She should remain off of this medication.  EEG in the hospital was normal.   3.  Depression.  I agree with the addition of remeron which has helped sleep, appetite and anxiety.  PLAN:   1.  Keppra will be decreased to 500 mg once daily.   Seizure and safety was discussed.  They understand increased risk of seizure and morbidity and mortality associated with this during times of transition. 2. PT/OT will be continued in SNF.  I have requested that they do a home safety OT eval upon d/c. 3.  A modified barium swallow will be performed. 4.  I will refer her to Dr. Dellia Cloud.  She refuses psychiatry eval. 5.  She will continue the levodopa, 25/100, 3 po tid.  Risks, benefits, side effects and alternative therapies were discussed.  The opportunity to ask questions was given and they were answered to the best of my ability.  The patient expressed understanding and willingness to follow the  outlined treatment protocols. 6. Pt education was provided and handouts given. 7.  Prolonged visit lasting 80 minutes. 8.  Follow up is anticipated in the next few months, sooner should new neurologic issues arise.

## 2012-04-12 ENCOUNTER — Telehealth: Payer: Self-pay | Admitting: Neurology

## 2012-04-12 NOTE — Telephone Encounter (Signed)
Called and spoke with [place]. Informed of mod barium swallow test which is scheduled at [facility] on 09/23 at 10:45 am. She also reports that they have already gotten an appointment with Dr. Dawayne Cirri office. Advised her to call us once her mom gets home from Rehab so we can set up a home safety eval. She states she will.

## 2012-04-13 ENCOUNTER — Telehealth: Payer: Self-pay

## 2012-04-13 NOTE — Telephone Encounter (Signed)
Left message with pt's daughter to call back to clarify.

## 2012-04-13 NOTE — Telephone Encounter (Signed)
[facility] has 2 prescriptions for fluoxetine 20 mg one has directions once daily the other twice daily. Vernona Rieger said she will speak with pharmacist.

## 2012-04-17 ENCOUNTER — Ambulatory Visit (HOSPITAL_COMMUNITY)
Admission: RE | Admit: 2012-04-17 | Discharge: 2012-04-17 | Disposition: A | Discharge status: Home / Self Care | Payer: 59 | Source: Ambulatory Visit | Attending: Neurology | Admitting: Neurology

## 2012-04-17 DIAGNOSIS — R192 Visible peristalsis: Secondary | ICD-10-CM | POA: Insufficient documentation

## 2012-04-17 DIAGNOSIS — G20 Parkinson's disease: Secondary | ICD-10-CM | POA: Insufficient documentation

## 2012-04-17 DIAGNOSIS — R1310 Dysphagia, unspecified: Secondary | ICD-10-CM

## 2012-04-17 DIAGNOSIS — R1319 Other dysphagia: Secondary | ICD-10-CM | POA: Insufficient documentation

## 2012-04-17 DIAGNOSIS — G219 Secondary parkinsonism, unspecified: Secondary | ICD-10-CM

## 2012-04-17 DIAGNOSIS — G20A1 Parkinson's disease without dyskinesia, without mention of fluctuations: Secondary | ICD-10-CM | POA: Insufficient documentation

## 2012-04-17 NOTE — Procedures (Signed)
Objective Swallowing Evaluation: Modified Barium Swallowing Study  Patient Details  Name: [name] MRN: [id number] Date of Birth: 1945/10/15  Today's Date: 04/17/2012 Time: 1100-1130 SLP Time Calculation (min): 30 min  Past Medical History:  Past Medical History  Diagnosis Date  . Dysmetabolic syndrome X   . Personal history of thrombophlebitis   . Essential hypertension, benign   . Morbid obesity   . Mixed hyperlipidemia   . Depressive disorder, not elsewhere classified   . Bell's palsy     1985  . Adjustment disorder with depressed mood   . DVT (deep venous thrombosis), left "early 2000's"    RLE  . Syncope and collapse 12/06/11    "this am; I pass out fairly often"  . Type II or unspecified type diabetes mellitus without mention of complication, not stated as uncontrolled 12/06/11    "not anymore; had lap band OR"  . Neurodegenerative gait disorder   . Headache 05/2011    "pretty often since they put me on Parkinson's medicine"  . Parkinson disease     "they think that's what it is"  . DEMENTIA   . 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 Anxiety   . History of shingles     "as a teen and in my 90's"   Past Surgical History:  Past Surgical History  Procedure Date  . Tonsillectomy and adenoidectomy 1970  . Bladder tack 11/1995  . Septoplasty 08/1998    with antral window   . Knee arthroscopy 12/12/2002    right   . Fracture surgery 02/09/2006    bilateral elbows  . Fracture surgery     right knee  . Fracture surgery     tib plateau  . Breast cyst aspiration 12/1998    bilaterally  . Breast biopsy 05/20/2000    bilaterally  . Tubal ligation 1970's  . Cataract extraction w/ intraocular lens  implant, bilateral   . Laparoscopic gastric banding 2008  . Cardiac catheterization 2004   HPI:  Pt is a 66 year old female arriving for an outpatient MBS. Pt complains of occasional regurgitation of POs, mucous. Pt was seen by this  SLP in 5/13, demonstrated regurgitation of PO, reports this has been happening for a while. Pt was about to d/c but had recommended esophagram. Esophagram on 5/13 shows: The oropharyngeal swallowing mechanisms are normal. Episodes of reverse perstalsis. Barium passess through Lap band.      Assessment / Plan / Recommendation Clinical Impression  Dysphagia Diagnosis: Suspected primary esophageal dysphagia Clinical impression: Pt presents with noral oropharyngeal function. No aspiration/penetration observed. Swallow timely and strong. Esophageal sweep revealed appearance of hesitation of passage of PO through GE junction. Then after several solid and thin boluses, stomach contents passed through GE junction to lower esophagus. No radiologist present to confirm. SLP provided education to pt regarding esophageal precautions. She may continue a regular diet with thin liquids. Suggest pt discuss fruther assessment for GER and likely LPR with MD.     Treatment Recommendation       Diet Recommendation Regular;Thin liquid   Liquid Administration via: Cup;Straw Medication Administration: Whole meds with liquid Supervision: Patient able to self feed Compensations: Small sips/bites;Slow rate;Follow solids with liquid (5-7 small meals a day) Postural Changes and/or Swallow Maneuvers: Seated upright 90 degrees;Upright 30-60 min after meal    Other  Recommendations Recommended Consults: Consider esophageal assessment;Consider GI evaluation Oral Care Recommendations: Oral care BID  Follow Up Recommendations  None    Frequency and Duration        Pertinent Vitals/Pain NA    SLP Swallow Goals     General HPI: Pt is a 66 year old female arriving for an outpatient MBS. Pt complains of occasional regurgitation of POs, mucous. Pt was seen by this SLP in 5/13, demonstrated regurgitation of PO, reports this has been happening for a while. Pt was about to d/c but had recommended esophagram. Esophagram on  5/13 shows: The oropharyngeal swallowing mechanisms are normal. Episodes of reverse perstalsis. Barium passess through Lap band.  Type of Study: Modified Barium Swallowing Study Reason for Referral: Objectively evaluate swallowing function Diet Prior to this Study: Regular;Thin liquids Oral Cavity - Dentition: Adequate natural dentition Oral Motor / Sensory Function: Impaired motor Oral impairment: Left facial Self-Feeding Abilities: Able to feed self Patient Positioning: Upright in chair Baseline Vocal Quality: Clear Volitional Cough: Strong Volitional Swallow: Able to elicit Anatomy: Within functional limits Pharyngeal Secretions: Not observed secondary MBS    Reason for Referral Objectively evaluate swallowing function   Oral Phase Oral Preparation/Oral Phase Oral Phase: WFL   Pharyngeal Phase Pharyngeal Phase Pharyngeal Phase: Within functional limits  Cervical Esophageal Phase    GO    Cervical Esophageal Phase Cervical Esophageal Phase: [facility]    Functional Assessment Tool Used: clinical judgement Functional Limitations: Swallowing Swallow Current Status (Z6109): At least 1 percent but less than 20 percent impaired, limited or restricted Swallow Goal Status [phone]): At least 1 percent but less than 20 percent impaired, limited or restricted Swallow Discharge Status [phone]): At least 1 percent but less than 20 percent impaired, limited or restricted    Kenyatte Chatmon, Riley Nearing 04/17/2012, 2:03 PM

## 2012-04-18 NOTE — Telephone Encounter (Signed)
Left message on home and cell numbers asking pt or her daughter to call back.

## 2012-04-19 NOTE — Telephone Encounter (Signed)
Spoke with daughter.  Pt is at rehab facility right now and daughter isnt sure how she is taking the prozac.  Pt has appt to see Dr. Dayton Martes on Friday and they will discuss then.

## 2012-04-21 ENCOUNTER — Encounter: Payer: Self-pay | Admitting: Family Medicine

## 2012-04-21 ENCOUNTER — Ambulatory Visit (INDEPENDENT_AMBULATORY_CARE_PROVIDER_SITE_OTHER): Payer: 59 | Admitting: Family Medicine

## 2012-04-21 ENCOUNTER — Ambulatory Visit (INDEPENDENT_AMBULATORY_CARE_PROVIDER_SITE_OTHER): Payer: 59 | Admitting: Psychology

## 2012-04-21 VITALS — BP 108/70 | HR 64 | Temp 97.6°F | Wt 154.0 lb

## 2012-04-21 DIAGNOSIS — F331 Major depressive disorder, recurrent, moderate: Secondary | ICD-10-CM

## 2012-04-21 DIAGNOSIS — G219 Secondary parkinsonism, unspecified: Secondary | ICD-10-CM

## 2012-04-21 DIAGNOSIS — F32A Depression, unspecified: Secondary | ICD-10-CM

## 2012-04-21 DIAGNOSIS — F329 Major depressive disorder, single episode, unspecified: Secondary | ICD-10-CM

## 2012-04-21 DIAGNOSIS — F3289 Other specified depressive episodes: Secondary | ICD-10-CM

## 2012-04-21 DIAGNOSIS — R569 Unspecified convulsions: Secondary | ICD-10-CM

## 2012-04-21 NOTE — Progress Notes (Signed)
66 yo here with her daughter for follow up.  She is actually doing remarkably well.  Was unfortunately admitted to [facility] after she had a seizure felt to be secondary to upward titration of Wellbutrin by psychiatry. This has been d/c'd and now on Prozac,  Remeron for her depression, insomnia and anxiety and was discharged from [facility] today.  [facility] is scheduled to do a home PT/OT evaluation tomorrow.  She is being followed by  [facility] for her problem with parkinsonism.    Most recently saw Dr. Arbutus Leas last week.  Note reviewed.  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 Keppra  decreased to 500 mg once daily.    MDS was performed- neg.   Also advised to continue levodopa, 25/100, 3 po tid.  She was also referred to Dr. Dellia Cloud for further psychotherapy. Saw him today- she felt it went well.  Shardai feels very positive about how things are going.  Her mood has improved and she is pleased with her plan with Drs Tat and Dellia Cloud. Still not ready to go back to work yet and is ok with starting out slow- working half days at first.    Current Outpatient Prescriptions on File Prior to Visit  Medication Sig Dispense Refill  . acetaminophen (TYLENOL) 325 MG tablet Take 2 tablets (650 mg total) by mouth every 6 (six) hours as needed for pain or fever (or Fever >/= 101).  30 tablet  0  . albuterol (PROVENTIL) (5 MG/ML) 0.5% nebulizer solution Take 0.5 mLs (2.5 mg total) by nebulization every 6 (six) hours as needed for wheezing or shortness of breath.  20 mL  0  . ALPRAZolam (XANAX) 0.5 MG tablet Take 1 tablet (0.5 mg total) by mouth 2 (two) times daily.  30 tablet  0  . aspirin 81 MG tablet Take 81 mg by mouth daily.       . carbidopa-levodopa (SINEMET IR) 25-100 MG per tablet Take 3 tablets by mouth 3 (three) times daily.      . Cholecalciferol (VITAMIN D3) 1000 UNITS CAPS Take 1 capsule by mouth daily.        . feeding supplement  (ENSURE COMPLETE) LIQD Take 237 mLs by mouth 2 (two) times daily between meals.  1 Bottle  11  . FLUoxetine (PROZAC) 20 MG capsule Take 1 capsule (20 mg total) by mouth daily.  90 capsule  1  . hydroxypropyl methylcellulose (ISOPTO TEARS) 2.5 % ophthalmic solution Place 1 drop into both eyes daily.      Marland Kitchen levETIRAcetam (KEPPRA) 500 MG tablet Take 1 tablet (500 mg total) by mouth 2 (two) times daily.      Marland Kitchen losartan-hydrochlorothiazide (HYZAAR) 50-12.5 MG per tablet Take 1 tablet by mouth daily with breakfast.       . mirtazapine (REMERON) 15 MG tablet Take 1 tablet (15 mg total) by mouth at bedtime.  90 tablet  1  . Multiple Vitamin (MULTIVITAMIN WITH MINERALS) TABS Take 1 tablet by mouth daily.      . simvastatin (ZOCOR) 80 MG tablet Take 80 mg by mouth at bedtime.      . vitamin B-12 (CYANOCOBALAMIN) 1000 MCG tablet Take 1,000 mcg by mouth daily.          Allergies  Allergen Reactions  . Codeine Sulfate     REACTION: hallucinations  . Penicillins Anaphylaxis, Swelling and Rash  . Wellbutrin (Bupropion) Other (See Comments)    Seizures and hallucinations  ROS:   See HPI 13 systems were reviewed and are notable for depression as above.  All other review of systems are unremarkable.   Physical exam: BP 108/70  Pulse 64  Temp 97.6 F (36.4 C)  Wt 154 lb (69.854 kg)  General: Well-developed,well-nourished,in no acute distress; alert,appropriate and cooperative throughout examination, minimally overweight.  Mouth: Oral mucosa and oropharynx without lesions or exudates. Teeth in good repair. Left facial paralysis unchanged.  Neck: No deformities, masses, or tenderness noted.  No bruits  Lungs: Normal respiratory effort, chest expands symmetrically. Lungs are clear to auscultation, no crackles or wheezes.  Heart: Normal rate and regular rhythm. S1 and S2 normal without gallop, murmur, click, rub or other extra sounds.  Abdomen: Bowel sounds positive,abdomen soft and non-tender  without masses, organomegaly or hernias noted. NO CVA or suprapubic tenderness.  Extremities: No clubbing, cyanosis, edema, or deformity noted with normal full range of motion of all joints.  Neurologic: Shuffled gait improved, NO TREMOR, NOT using a cane! Psych: Cognition and judgment appear intact. Alert and cooperative with normal attention span and concentration. No apparent delusions, illusions, hallucinations  Assessment and Plan:  1. Seizure  Resolved. Presumed secondary to Wellbutrin, which has obviously been d/c'd.  2. Secondary parkinsonism  Followed by Dr. Arbutus Leas and seems to be doing very well.  3. Depression  Improved with psychotherapy and addition of Remeron.

## 2012-04-21 NOTE — Patient Instructions (Addendum)
You can try  Xantac or Pepcid over the counter for acid reflux. Great to see you. You look wonderful- have a great weekend!

## 2012-04-24 ENCOUNTER — Telehealth: Payer: Self-pay | Admitting: Neurology

## 2012-04-24 NOTE — Telephone Encounter (Signed)
Spoke with [name]. She called to let me know that her mom was discharged from the rehab facility, [facility] last Friday and that [facility] has been set up to come to her house for physical therapy and a safety evaluation. She also asked about follow up with her mom's disability provider, [employer] and I told her to direct them to [place] for any additional information that may be needed on her behalf. She states she will.

## 2012-04-24 NOTE — Telephone Encounter (Signed)
Pt's daughter wants to speak to a nurse about home therapy. She also states that Dr. Arbutus Leas previously filled out some paperwork for [employer] and now the company has additional questions. When can she return back to work? What's keeping her from coming back to work? Please advise.

## 2012-05-02 ENCOUNTER — Other Ambulatory Visit: Payer: Self-pay | Admitting: Family Medicine

## 2012-05-03 ENCOUNTER — Telehealth: Payer: Self-pay | Admitting: Neurology

## 2012-05-03 ENCOUNTER — Other Ambulatory Visit: Payer: Self-pay | Admitting: Neurology

## 2012-05-03 MED ORDER — MIRTAZAPINE 15 MG PO TABS: 15.0000 mg | ORAL_TABLET | Freq: Every day | ORAL | Status: DC

## 2012-05-03 MED ORDER — LEVETIRACETAM 500 MG PO TABS: 500.0000 mg | ORAL_TABLET | Freq: Every day | ORAL | Status: DC

## 2012-05-03 NOTE — Telephone Encounter (Signed)
Pt's daughter states that [facility] never received the rx for Remeron that was sent on 04/11/2012. She would also like a refill on Keppra sent to the same [facility]. Please call [name] once both prescriptions are sent.

## 2012-05-03 NOTE — Telephone Encounter (Signed)
Both scripts called to the mail order pharmacy. Patient's daughter, [name] notified. No other concerns at this time.

## 2012-05-03 NOTE — Telephone Encounter (Signed)
Okay to refill.  3 month supply with 1 RF for both please

## 2012-05-03 NOTE — Telephone Encounter (Signed)
**  Dr. Arbutus Leas, ok to fill the Remeron as denoted in the patient's med list? Not sure why [facility] never received the original script. Also, they are requesting a refill on the Keppra 500 mg; one po q day; #90; RF x 1. Last office note states the Keppra was decreased to once a day. Thank you.

## 2012-05-04 ENCOUNTER — Ambulatory Visit: Payer: 59 | Admitting: Psychology

## 2012-05-11 ENCOUNTER — Ambulatory Visit (INDEPENDENT_AMBULATORY_CARE_PROVIDER_SITE_OTHER): Payer: 59 | Admitting: Psychology

## 2012-05-11 DIAGNOSIS — F331 Major depressive disorder, recurrent, moderate: Secondary | ICD-10-CM

## 2012-05-12 ENCOUNTER — Telehealth: Payer: Self-pay | Admitting: Family Medicine

## 2012-05-12 NOTE — Telephone Encounter (Signed)
Caller: Jackie/Child; Patient Name: [name]; PCP: [name] [facility]); Best Callback Phone Number: [phone] Onset-2 weeks per pt.  Afebrile. Daughter calling concerning parent having difficulty sleeping, called patient for triage.  She is only sleeping for 3- 4 hrs. Emergent s/s of Sleep Disorder protocol r/o. Pt to see provider within 72hrs. Appointment scheduled for 05/15/12 at 8:15 am with Dr. Dayton Martes.

## 2012-05-15 ENCOUNTER — Ambulatory Visit: Payer: Self-pay | Admitting: Family Medicine

## 2012-05-18 ENCOUNTER — Ambulatory Visit (INDEPENDENT_AMBULATORY_CARE_PROVIDER_SITE_OTHER): Payer: 59 | Admitting: Psychology

## 2012-05-18 DIAGNOSIS — F331 Major depressive disorder, recurrent, moderate: Secondary | ICD-10-CM

## 2012-05-19 ENCOUNTER — Ambulatory Visit: Payer: Self-pay | Admitting: Family Medicine

## 2012-05-25 ENCOUNTER — Ambulatory Visit (INDEPENDENT_AMBULATORY_CARE_PROVIDER_SITE_OTHER): Payer: 59 | Admitting: Psychology

## 2012-05-25 DIAGNOSIS — F331 Major depressive disorder, recurrent, moderate: Secondary | ICD-10-CM

## 2012-05-26 ENCOUNTER — Ambulatory Visit (HOSPITAL_COMMUNITY): Payer: 59 | Admitting: Psychiatry

## 2012-06-01 ENCOUNTER — Ambulatory Visit: Payer: Self-pay | Admitting: Psychology

## 2012-06-02 ENCOUNTER — Ambulatory Visit: Payer: Self-pay | Admitting: Family Medicine

## 2012-06-06 ENCOUNTER — Ambulatory Visit (INDEPENDENT_AMBULATORY_CARE_PROVIDER_SITE_OTHER): Payer: 59 | Admitting: Neurology

## 2012-06-06 ENCOUNTER — Encounter: Payer: Self-pay | Admitting: Neurology

## 2012-06-06 VITALS — BP 108/60 | HR 80 | Temp 97.9°F | Resp 16 | Ht 61.0 in | Wt 148.0 lb

## 2012-06-06 DIAGNOSIS — F3289 Other specified depressive episodes: Secondary | ICD-10-CM

## 2012-06-06 DIAGNOSIS — G219 Secondary parkinsonism, unspecified: Secondary | ICD-10-CM

## 2012-06-06 DIAGNOSIS — F329 Major depressive disorder, single episode, unspecified: Secondary | ICD-10-CM

## 2012-06-06 DIAGNOSIS — R569 Unspecified convulsions: Secondary | ICD-10-CM

## 2012-06-06 DIAGNOSIS — R269 Unspecified abnormalities of gait and mobility: Secondary | ICD-10-CM

## 2012-06-06 MED ORDER — MIRTAZAPINE 30 MG PO TABS: 30.0000 mg | ORAL_TABLET | Freq: Every day | ORAL | Status: DC

## 2012-06-06 NOTE — Progress Notes (Signed)
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   Chief Complaint: The patient's primary symptom is balance/gait instability.  Specific Symptoms:  Tremor: Was first symptom, and began 5-4 years ago.  Tremor started in both hands equally.  Tremor got slowly worse.  Tremor is never at rest.  Falls occurred at same time (initial presentation).  Had syncopal episodes in the beginning but seems to occur rarely now.  There is a question of whether these were true syncope or just falls, but sounds like most were falls with few episodes syncope in past. Voice: softer with time, better recently Sleep: EDS  Vivid Dreams:  no  Acting out dreams:  Yes per daughter, patient unsure.  Pts daughter states that if she sleeps with pt, the patient will kick/jerk but no sleep walking or talking Wet Pillows: yes, attributes to bells palsy hx. Postural symptoms:  Poor balance  Falls?  3 weeks ago.  Falls were daily, but now in rehab and no falls for 3 weeks.  Usually a fall is due to bending over.  Bradykinesia symptoms: yes, slow walking Loss of smell:  Was poor until 2 weeks ago, but now can smell Loss of taste:  Was poor until 2 weeks ago, but now can taste Urinary Incontinence:  Yes, because not can get to the bathroom fast enough.  Wears depends Difficulty Swallowing:  No, but seems to "choke" on phlegm on daily basis, not any particular time of day Handwriting, micrographia: yes, but doing some therapy at home with daughter and improving Trouble with ADL's:  Trouble buttoning clothing  Trouble buttoning clothing:  yes Depression:  Yes but not suicidal/homicidal.  Would like to talk with psychologist. Memory changes:  Some short term memory loss since death of husband 5 years ago.  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  Neuroimaging has previously been performed.    It was done on 04/23/11 and demonstrated significant atrophy especially in the temporal regions and moderate small vessel disease.  Gad was not given.  06/06/2012 The patient did have a swallowing evaluation since last visit that was negative for any type of aspiration.  She also was referred to Dr. Dellia Cloud for evaluation for depression.  She liked him and has a f/u after xmas.  Last visit, I did decrease the patient's Keppra 500 mg once daily.  She has not had further seizures and she would like to try and d/c the medication.  As you know, she is a history of seizure that was likely reactive to the addition of Wellbutrin.  The patient has been d/c from SNF and after that, was told that she could not go back to work.  Because of that, appetite has decreased, depression has increased, motivation has been poor and she has had some day/night reversal.   The patient is on Carbidopa/levodopa and has been on it 25/100, 3 po tid.  They were not sure if it was helping, they attempted to wean it and walking got worse.   PREVIOUS MEDICATIONS: Sinemet  ALLERGIES:   Allergies  Allergen Reactions  . Codeine Sulfate     REACTION: hallucinations  . Penicillins Anaphylaxis, Swelling and Rash  . Wellbutrin (Bupropion) Other (See Comments)    Seizures and hallucinations    CURRENT MEDICATIONS:  Current Outpatient Prescriptions on File Prior to Visit  Medication Sig Dispense Refill  . acetaminophen (TYLENOL) 325  MG tablet Take 2 tablets (650 mg total) by mouth every 6 (six) hours as needed for pain or fever (or Fever >/= 101).  30 tablet  0  . albuterol (PROVENTIL) (5 MG/ML) 0.5% nebulizer solution Take 0.5 mLs (2.5 mg total) by nebulization every 6 (six) hours as needed for wheezing or shortness of breath.  20 mL  0  . ALPRAZolam (XANAX) 0.5 MG tablet Take 1 tablet (0.5 mg total) by mouth 2 (two) times daily.  30 tablet  0  . aspirin 81 MG tablet Take 81 mg by mouth daily.       . carbidopa-levodopa  (SINEMET IR) 25-100 MG per tablet Take 3 tablets by mouth 3 (three) times daily.      . Cholecalciferol (VITAMIN D3) 1000 UNITS CAPS Take 1 capsule by mouth daily.        . feeding supplement (ENSURE COMPLETE) LIQD Take 237 mLs by mouth 2 (two) times daily between meals.  1 Bottle  11  . FLUoxetine (PROZAC) 20 MG capsule Take 1 capsule (20 mg total) by mouth daily.  90 capsule  1  . hydroxypropyl methylcellulose (ISOPTO TEARS) 2.5 % ophthalmic solution Place 1 drop into both eyes daily.      Marland Kitchen levETIRAcetam (KEPPRA) 500 MG tablet Take 1 tablet (500 mg total) by mouth daily.  90 tablet  1  . losartan-hydrochlorothiazide (HYZAAR) 50-12.5 MG per tablet TAKE 1 TABLET DAILY  90 tablet  3  . Multiple Vitamin (MULTIVITAMIN WITH MINERALS) TABS Take 1 tablet by mouth daily.      . simvastatin (ZOCOR) 80 MG tablet Take 80 mg by mouth at bedtime.      . vitamin B-12 (CYANOCOBALAMIN) 1000 MCG tablet Take 1,000 mcg by mouth daily.        . [DISCONTINUED] mirtazapine (REMERON) 15 MG tablet Take 1 tablet (15 mg total) by mouth at bedtime.  90 tablet  1    PAST MEDICAL HISTORY:   Past Medical History  Diagnosis Date  . Dysmetabolic syndrome X   . Personal history of thrombophlebitis   . Essential hypertension, benign   . Morbid obesity   . Mixed hyperlipidemia   . Depressive disorder, not elsewhere classified   . Bell's palsy     1985  . Adjustment disorder with depressed mood   . DVT (deep venous thrombosis), left "early 2000's"    RLE  . Syncope and collapse 12/06/11    "this am; I pass out fairly often"  . Type II or unspecified type diabetes mellitus without mention of complication, not stated as uncontrolled 12/06/11    "not anymore; had lap band OR"  . Neurodegenerative gait disorder   . Headache 05/2011    "pretty often since they put me on Parkinson's medicine"  . Parkinson disease     "they think that's what it is"  . DEMENTIA   . 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 Anxiety   . History of shingles     "as a teen and in my 35's"    PAST SURGICAL HISTORY:   Past Surgical History  Procedure Date  . Tonsillectomy and adenoidectomy 1970  . Bladder tack 11/1995  . Septoplasty 08/1998    with antral window   . Knee arthroscopy 12/12/2002    right   . Fracture surgery 02/09/2006    bilateral elbows  . Fracture surgery     right knee  . Fracture surgery  tib plateau  . Breast cyst aspiration 12/1998    bilaterally  . Breast biopsy 05/20/2000    bilaterally  . Tubal ligation 1970's  . Cataract extraction w/ intraocular lens  implant, bilateral   . Laparoscopic gastric banding 2008  . Cardiac catheterization 2004    SOCIAL HISTORY:   History   Social History  . Marital Status: Widowed    Spouse Name: N/A    Number of Children: 2  . Years of Education: N/A   Occupational History  . 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 Smoking status: Never Smoker   . Smokeless tobacco: Never Used  . Alcohol Use: No     Comment: none  . Drug Use: No  . Sexually Active: No   Other Topics Concern  . Not on file   Social History Narrative   Lives with daughter and granddaughter    FAMILY HISTORY:   Family Status  Relation Status Death Age  . Mother Deceased 37    natural causes  . Father Deceased 58    MI  . Sister Alive     DM-2  . Child Alive     2, HTN, DM-2, hyperlipidemia    ROS:  A complete 10 system review of systems was obtained and was unremarkable apart from what is mentioned above.  PHYSICAL EXAMINATION:    VITALS:   Filed Vitals:   06/06/12 1436  BP: 108/60  Pulse: 80  Temp: 97.9 F (36.6 C)  Resp: 16  Height: 5\' 1"  (1.549 m)  Weight: 148 lb (67.132 kg)      GEN:  The patient appears stated age and is in NAD. HEENT:  Normocephalic, atraumatic.  The mucous membranes are moist. The superficial temporal arteries are without ropiness or tenderness. CV:   RRR Lungs:  CTAB Neck/HEME:  There are no carotid bruits bilaterally.  Neurological examination:  Orientation: The patient was alert and oriented.  MocA 04/11/2012 was 25/30. Cranial nerves: There is L facial droop with decreased forehead wrinkle on the L.  She has a positive stellwags sign. Pupils are equal round and reactive to light bilaterally. Fundoscopic exam reveals clear margins bilaterally. Extraocular muscles are intact but there is some upgaze paresis.   There are square wave jerks and difficulty with smooth pursuit.  The visual fields are full to confrontational testing. The speech is hypophonic but fluent and clear. She has no trouble repeating gutteral sounds.  Soft palate rises symmetrically and there is no tongue deviation. Hearing is intact to conversational tone. Sensation: Sensation is intact to light and pinprick throughout (facial, trunk, extremities). Vibration is intact at the bilateral big toe. There is no extinction with double simultaneous stimulation. There is no sensory dermatomal level identified. Motor: Strength is 5/5 in the bilateral upper and lower extremities.   Shoulder shrug is equal and symmetric.  There is no pronator drift. Deep tendon reflexes: Deep tendon reflexes are 3/4 at the bilateral biceps, triceps, brachioradialis, and achilles. There are cross adductor reflexes at the knees bilaterally.  Plantar responses are upgoing bilaterally.  Movement examination: Tone: There is mild increased tone in the right upper extremity but normal on the L..  The tone in the lower extremities is normal.  Abnormal movements: Rare tremor with activation on the L. Coordination:  There is decremation with RAM's, including heel taps bilaterally, finger taps and toe taps Gait and Station: The patient has some difficulty arising  out of a deep-seated chair without the use of the hands but is able to arise on trial 2. The patient's stride length is decreased mildly but she is much  more stable than last visit.  ASSESSMENT:  1.  Parkinsonism.   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  Her primary issue is gait instability with slowness of movement.  While she does have some memory loss, this is not a prominent feature.  I believe that FTD and LBD are lower on the list of Ddx, given the relative lack of personality changes per family.  The hallucinations were likely medication induced. 2.  Hx of seizure.  This is likely secondary to the initiation of wellbutrin.  She should remain off of this medication.  EEG in the hospital was normal.   3.  Depression.  I agree with the addition of remeron which has helped sleep, appetite and anxiety.  She is seeing Dr. Dellia Cloud.  The patient requests trying to increase the remeron to help appetite again.  PLAN:   1.  Keppra will be discontinued.   Seizure and safety was discussed.  They understand increased risk of seizure and morbidity and mortality associated with this during times of transition and weaning of medication. 2.  I have requested that they do a home safety OT eval and home PT. 3.  I spent greater than 50% of the 45 min visit in counseling with the patient and her daughter.  I talked about the importance of maintaining routine and maintaining day/night schedule without reversal.  I would like to see her involved with some type of social activity, perhaps volunteering.  I would like to see her exercise. 4.  She will continue with Dr. Dellia Cloud.  She refuses psychiatry eval. 5.  She will continue the levodopa, 25/100, 3 po tid.  Risks, benefits, side effects and alternative therapies were discussed.  The opportunity to ask questions was given and they were answered to the best of my ability.  The patient expressed understanding and willingness to follow the outlined treatment protocols. 6. I did increase her Remeron to 30 mg nightly.  I am hoping that this will help her sleep better during the night, decrease anxiety  and increase appetite. 7. Return in about 3 months (around 09/06/2012).

## 2012-06-06 NOTE — Patient Instructions (Addendum)
1.  Stop the Keppra 2.  I increased your remeron to 30 mg nightly 3. EXERCISE 4.  Find a volunteer position or something where you socially interact  I will refer you to [facility] for PT and OT. They will call you to set up a home visit.

## 2012-06-07 ENCOUNTER — Other Ambulatory Visit: Payer: Self-pay | Admitting: Neurology

## 2012-06-07 DIAGNOSIS — G219 Secondary parkinsonism, unspecified: Secondary | ICD-10-CM

## 2012-06-07 DIAGNOSIS — R269 Unspecified abnormalities of gait and mobility: Secondary | ICD-10-CM

## 2012-06-14 ENCOUNTER — Encounter: Payer: Self-pay | Admitting: Neurology

## 2012-06-14 ENCOUNTER — Telehealth: Payer: Self-pay | Admitting: Neurology

## 2012-06-14 NOTE — Telephone Encounter (Signed)
Received a call from [name], the patient's daughter. She is requesting a letter from Dr. Arbutus Leas excusing her mom from reporting for jury duty on 12/11 in [facility]. Juror number was #[id number]. I told her that that should not be a problem and that she could pick the letter up tomorrow as Dr. Arbutus Leas was not in the office to sign the letter. She is ok with this plan. **Dr. Arbutus Leas, I put the letter on your desk for a signature in the am. Thanks.

## 2012-06-21 ENCOUNTER — Emergency Department (HOSPITAL_COMMUNITY): Payer: 59

## 2012-06-21 ENCOUNTER — Encounter (HOSPITAL_COMMUNITY): Payer: Self-pay | Admitting: Emergency Medicine

## 2012-06-21 ENCOUNTER — Emergency Department (HOSPITAL_COMMUNITY)
Admission: EM | Admit: 2012-06-21 | Discharge: 2012-06-21 | Disposition: A | Discharge status: Home / Self Care | Payer: 59 | Attending: Emergency Medicine | Admitting: Emergency Medicine

## 2012-06-21 DIAGNOSIS — F411 Generalized anxiety disorder: Secondary | ICD-10-CM | POA: Insufficient documentation

## 2012-06-21 DIAGNOSIS — M542 Cervicalgia: Secondary | ICD-10-CM | POA: Insufficient documentation

## 2012-06-21 DIAGNOSIS — W1789XA Other fall from one level to another, initial encounter: Secondary | ICD-10-CM | POA: Insufficient documentation

## 2012-06-21 DIAGNOSIS — W19XXXA Unspecified fall, initial encounter: Secondary | ICD-10-CM

## 2012-06-21 DIAGNOSIS — S0003XA Contusion of scalp, initial encounter: Secondary | ICD-10-CM | POA: Insufficient documentation

## 2012-06-21 DIAGNOSIS — Z862 Personal history of diseases of the blood and blood-forming organs and certain disorders involving the immune mechanism: Secondary | ICD-10-CM | POA: Insufficient documentation

## 2012-06-21 DIAGNOSIS — Z23 Encounter for immunization: Secondary | ICD-10-CM | POA: Insufficient documentation

## 2012-06-21 DIAGNOSIS — S022XXA Fracture of nasal bones, initial encounter for closed fracture: Secondary | ICD-10-CM

## 2012-06-21 DIAGNOSIS — S0990XA Unspecified injury of head, initial encounter: Secondary | ICD-10-CM | POA: Insufficient documentation

## 2012-06-21 DIAGNOSIS — Z8669 Personal history of other diseases of the nervous system and sense organs: Secondary | ICD-10-CM | POA: Insufficient documentation

## 2012-06-21 DIAGNOSIS — Z79899 Other long term (current) drug therapy: Secondary | ICD-10-CM | POA: Insufficient documentation

## 2012-06-21 DIAGNOSIS — Y9301 Activity, walking, marching and hiking: Secondary | ICD-10-CM | POA: Insufficient documentation

## 2012-06-21 DIAGNOSIS — E119 Type 2 diabetes mellitus without complications: Secondary | ICD-10-CM | POA: Insufficient documentation

## 2012-06-21 DIAGNOSIS — F0280 Dementia in other diseases classified elsewhere without behavioral disturbance: Secondary | ICD-10-CM | POA: Insufficient documentation

## 2012-06-21 DIAGNOSIS — Z9889 Other specified postprocedural states: Secondary | ICD-10-CM | POA: Insufficient documentation

## 2012-06-21 DIAGNOSIS — Z7982 Long term (current) use of aspirin: Secondary | ICD-10-CM | POA: Insufficient documentation

## 2012-06-21 DIAGNOSIS — S01501A Unspecified open wound of lip, initial encounter: Secondary | ICD-10-CM | POA: Insufficient documentation

## 2012-06-21 DIAGNOSIS — G20A1 Parkinson's disease without dyskinesia, without mention of fluctuations: Secondary | ICD-10-CM | POA: Insufficient documentation

## 2012-06-21 DIAGNOSIS — F329 Major depressive disorder, single episode, unspecified: Secondary | ICD-10-CM | POA: Insufficient documentation

## 2012-06-21 DIAGNOSIS — Z86718 Personal history of other venous thrombosis and embolism: Secondary | ICD-10-CM | POA: Insufficient documentation

## 2012-06-21 DIAGNOSIS — Z8639 Personal history of other endocrine, nutritional and metabolic disease: Secondary | ICD-10-CM | POA: Insufficient documentation

## 2012-06-21 DIAGNOSIS — F3289 Other specified depressive episodes: Secondary | ICD-10-CM | POA: Insufficient documentation

## 2012-06-21 DIAGNOSIS — S0083XA Contusion of other part of head, initial encounter: Secondary | ICD-10-CM

## 2012-06-21 DIAGNOSIS — E785 Hyperlipidemia, unspecified: Secondary | ICD-10-CM | POA: Insufficient documentation

## 2012-06-21 DIAGNOSIS — I10 Essential (primary) hypertension: Secondary | ICD-10-CM | POA: Insufficient documentation

## 2012-06-21 DIAGNOSIS — G20 Parkinson's disease: Secondary | ICD-10-CM | POA: Insufficient documentation

## 2012-06-21 DIAGNOSIS — Y92410 Unspecified street and highway as the place of occurrence of the external cause: Secondary | ICD-10-CM | POA: Insufficient documentation

## 2012-06-21 LAB — CBC WITH DIFFERENTIAL/PLATELET
Basophils Absolute: 0 10*3/uL (ref 0.0–0.1)
Basophils Relative: 0 % (ref 0–1)
Eosinophils Absolute: 0.1 10*3/uL (ref 0.0–0.7)
Eosinophils Relative: 1 % (ref 0–5)
HCT: 37.9 % (ref 36.0–46.0)
Hemoglobin: 12.4 g/dL (ref 12.0–15.0)
Lymphocytes Relative: 17 % (ref 12–46)
Lymphs Abs: 1.8 10*3/uL (ref 0.7–4.0)
MCH: 30.8 pg (ref 26.0–34.0)
MCHC: 32.7 g/dL (ref 30.0–36.0)
MCV: 94.3 fL (ref 78.0–100.0)
Monocytes Absolute: 0.9 10*3/uL (ref 0.1–1.0)
Monocytes Relative: 8 % (ref 3–12)
Neutro Abs: 7.7 10*3/uL (ref 1.7–7.7)
Neutrophils Relative %: 74 % (ref 43–77)
Platelets: 203 10*3/uL (ref 150–400)
RBC: 4.02 MIL/uL (ref 3.87–5.11)
RDW: 12.8 % (ref 11.5–15.5)
WBC: 10.4 10*3/uL (ref 4.0–10.5)

## 2012-06-21 LAB — COMPREHENSIVE METABOLIC PANEL
ALT: <5 U/L (ref 0–35)
AST: 17 U/L (ref 0–37)
Albumin: 3.5 g/dL (ref 3.5–5.2)
Alkaline Phosphatase: 58 U/L (ref 39–117)
BUN: 19 mg/dL (ref 6–23)
CO2: 27 mEq/L (ref 19–32)
Calcium: 10.2 mg/dL (ref 8.4–10.5)
Chloride: 103 mEq/L (ref 96–112)
Creatinine, Ser: 0.59 mg/dL (ref 0.50–1.10)
GFR calc Af Amer: >90 mL/min (ref >90)
GFR calc non Af Amer: >90 mL/min (ref >90)
Glucose, Bld: 103 mg/dL — ABNORMAL HIGH (ref 70–99)
Potassium: 4.5 mEq/L (ref 3.5–5.1)
Sodium: 140 mEq/L (ref 135–145)
Total Bilirubin: 0.3 mg/dL (ref 0.3–1.2)
Total Protein: 6.1 g/dL (ref 6.0–8.3)

## 2012-06-21 LAB — GLUCOSE, CAPILLARY: Glucose-Capillary: 88 mg/dL (ref 70–99)

## 2012-06-21 LAB — TROPONIN I: Troponin I: <0.3 ng/mL (ref <0.30)

## 2012-06-21 MED ORDER — LIDOCAINE-EPINEPHRINE 2 %-1:100000 IJ SOLN
20.0000 mL | Freq: Once | INTRAMUSCULAR | Status: AC
  Administered 2012-06-21: 20 mL
  Filled 2012-06-21: qty 20

## 2012-06-21 MED ORDER — SULFAMETHOXAZOLE-TRIMETHOPRIM 800-160 MG PO TABS: 1.0000 | ORAL_TABLET | Freq: Two times a day (BID) | ORAL | Status: DC

## 2012-06-21 MED ORDER — LIDOCAINE-EPINEPHRINE (PF) 2 %-1:200000 IJ SOLN
20.0000 mL | Freq: Once | INTRAMUSCULAR | Status: DC
  Filled 2012-06-21: qty 20

## 2012-06-21 MED ORDER — HYDROCODONE-ACETAMINOPHEN 5-325 MG PO TABS: 2.0000 | ORAL_TABLET | ORAL | Status: DC | PRN

## 2012-06-21 MED ORDER — TETANUS-DIPHTH-ACELL PERTUSSIS 5-2.5-18.5 LF-MCG/0.5 IM SUSP
0.5000 mL | Freq: Once | INTRAMUSCULAR | Status: AC
  Administered 2012-06-21: 0.5 mL via INTRAMUSCULAR
  Filled 2012-06-21: qty 0.5

## 2012-06-21 MED ORDER — OXYCODONE-ACETAMINOPHEN 5-325 MG PO TABS
2.0000 | ORAL_TABLET | Freq: Once | ORAL | Status: AC
  Administered 2012-06-21: 2 via ORAL
  Filled 2012-06-21: qty 2

## 2012-06-21 NOTE — ED Notes (Signed)
Pt ambulated in hall to bathroom, with minimal assist.

## 2012-06-21 NOTE — ED Notes (Signed)
Patient transported to CT 

## 2012-06-21 NOTE — ED Notes (Signed)
Arrived via [facility]. Patient was going into a restaurant when she tripped and fell. Apparent facial injury. Non-witnessed. EMS reports that patient was responsive on scene, does not remember incident.

## 2012-06-21 NOTE — ED Notes (Signed)
Patient transported to X-ray 

## 2012-06-21 NOTE — ED Provider Notes (Signed)
History     CSN: [id number]  Arrival date & time 06/21/12  1859   First MD Initiated Contact with Patient 06/21/12 1903      Chief Complaint  Patient presents with  . Fall    (Consider location/radiation/quality/duration/timing/severity/associated sxs/prior treatment) HPI Comments: Patient presents after mechanical fall. She was walking into a restaurant and tripped on a curb hitting her face on the pavement. This was witnessed by her daughter. There was no loss of consciousness. She did assistance to get up. She's not had vomiting. She has bleeding from her nose, lip and mouth. She is not on any anticoagulants. She has a history of Parkinson's. She has abrasions to her bilateral lower extremities. She denies any chest pain, abdominal pain, back pain. Contrary to triage note this fall was witnessed and the patient did not lose consciousness.  The history is provided by the patient.    Past Medical History  Diagnosis Date  . Dysmetabolic syndrome X   . Personal history of thrombophlebitis   . Essential hypertension, benign   . Morbid obesity   . Mixed hyperlipidemia   . Depressive disorder, not elsewhere classified   . Bell's palsy     1985  . Adjustment disorder with depressed mood   . DVT (deep venous thrombosis), left "early 2000's"    RLE  . Syncope and collapse 12/06/11    "this am; I pass out fairly often"  . Type II or unspecified type diabetes mellitus without mention of complication, not stated as uncontrolled 12/06/11    "not anymore; had lap band OR"  . Neurodegenerative gait disorder   . Headache 05/2011    "pretty often since they put me on Parkinson's medicine"  . Parkinson disease     "they think that's what it is"  . DEMENTIA   . 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 Anxiety   . History of shingles     "as a teen and in my 45's"    Past Surgical History  Procedure Date  . Tonsillectomy and adenoidectomy 1970  .  Bladder tack 11/1995  . Septoplasty 08/1998    with antral window   . Knee arthroscopy 12/12/2002    right   . Fracture surgery 02/09/2006    bilateral elbows  . Fracture surgery     right knee  . Fracture surgery     tib plateau  . Breast cyst aspiration 12/1998    bilaterally  . Breast biopsy 05/20/2000    bilaterally  . Tubal ligation 1970's  . Cataract extraction w/ intraocular lens  implant, bilateral   . Laparoscopic gastric banding 2008  . Cardiac catheterization 2004    Family History  Problem Relation Age of Onset  . COPD Father   . Diabetes Sister   . Vision loss Sister     legally blind  . Asthma Mother   . Heart disease Father     History  Substance Use Topics  . Smoking status: Never Smoker   . Smokeless tobacco: Never Used  . Alcohol Use: No     Comment: none    OB History    Grav Para Term Preterm Abortions TAB SAB Ect Mult Living                  Review of Systems  Constitutional: Negative for fever, activity change and appetite change.  HENT: Negative for congestion, sore throat, rhinorrhea and neck pain.  Eyes: Negative for visual disturbance.  Respiratory: Negative for cough, chest tightness and shortness of breath.   Cardiovascular: Negative for chest pain.  Gastrointestinal: Negative for nausea, vomiting and abdominal pain.  Genitourinary: Negative for dysuria and hematuria.  Musculoskeletal: Negative for back pain.  Skin: Positive for wound.  Neurological: Positive for headaches. Negative for dizziness and weakness.    Allergies  Codeine sulfate; Penicillins; and Wellbutrin  Home Medications   Current Outpatient Rx  Name  Route  Sig  Dispense  Refill  . ACETAMINOPHEN 325 MG PO TABS   Oral   Take 2 tablets (650 mg total) by mouth every 6 (six) hours as needed for pain or fever (or Fever >/= 101).   30 tablet   0   . ALPRAZOLAM 0.5 MG PO TABS   Oral   Take 1 tablet (0.5 mg total) by mouth 2 (two) times daily.   30 tablet    0   . ASPIRIN 81 MG PO TABS   Oral   Take 81 mg by mouth daily.          Marland Kitchen CARBIDOPA-LEVODOPA 25-100 MG PO TABS   Oral   Take 3 tablets by mouth 3 (three) times daily.         Marland Kitchen VITAMIN D3 1000 UNITS PO CAPS   Oral   Take 1 capsule by mouth daily.           Marland Kitchen ENSURE COMPLETE PO LIQD   Oral   Take 237 mLs by mouth 2 (two) times daily between meals.   1 Bottle   11   . FLUOXETINE HCL 20 MG PO CAPS   Oral   Take 1 capsule (20 mg total) by mouth daily.   90 capsule   1   . HYPROMELLOSE 2.5 % OP SOLN   Both Eyes   Place 1 drop into both eyes daily.         Marland Kitchen LEVETIRACETAM 500 MG PO TABS   Oral   Take 1 tablet (500 mg total) by mouth daily.   90 tablet   1   . LOSARTAN POTASSIUM-HCTZ 50-12.5 MG PO TABS      TAKE 1 TABLET DAILY   90 tablet   3   . MIRTAZAPINE 30 MG PO TABS   Oral   Take 1 tablet (30 mg total) by mouth at bedtime.   30 tablet   3   . ADULT MULTIVITAMIN W/MINERALS CH   Oral   Take 1 tablet by mouth daily.         Marland Kitchen SIMVASTATIN 80 MG PO TABS   Oral   Take 80 mg by mouth at bedtime.         Marland Kitchen VITAMIN B-12 1000 MCG PO TABS   Oral   Take 1,000 mcg by mouth daily.             BP 153/79  Temp 98.1 F (36.7 C) (Oral)  Resp 20  SpO2 99%  Physical Exam  Constitutional: She is oriented to person, place, and time. She appears well-developed and well-nourished. No distress.  HENT:  Head: Normocephalic and atraumatic.    Mouth/Throat: Oropharynx is clear and moist. No oropharyngeal exudate.       Abrasion and ecchymosis to bridge of nose and chin. Dried blood in bilateral nares, no septal hematoma or hemotympanum Dried blood to oropharynx and lips. There is a mucosal laceration to the inner lower lip.  Eyes: Conjunctivae normal are normal. Pupils are  equal, round, and reactive to light.  Neck: Normal range of motion. Neck supple.       No midline C spine pain, stepoff or deformity  Cardiovascular: Normal rate, regular rhythm and  normal heart sounds.   No murmur heard. Pulmonary/Chest: Effort normal and breath sounds normal. No respiratory distress.  Abdominal: Soft. There is no tenderness. There is no rebound and no guarding.  Musculoskeletal: Normal range of motion. She exhibits no edema and no tenderness.       Abrasions to bilateral shins.    Neurological: She is alert and oriented to person, place, and time. No cranial nerve deficit. She exhibits normal muscle tone. Coordination normal.    ED Course  LACERATION REPAIR Performed by: Glynn Octave Authorized by: Glynn Octave Consent: Verbal consent obtained. Consent given by: patient Patient understanding: patient states understanding of the procedure being performed Patient identity confirmed: verbally with patient Time out: Immediately prior to procedure a "time out" was called to verify the correct patient, procedure, equipment, support staff and site/side marked as required. Body area: head/neck Location details: upper lip Full thickness lip laceration: no [place] border involved: no Laceration length: 2 cm Tendon involvement: none Nerve involvement: none Vascular damage: no Anesthesia: local infiltration Local anesthetic: lidocaine 2% with epinephrine Anesthetic total: 4 ml Patient sedated: no Preparation: Patient was prepped and draped in the usual sterile fashion. Irrigation solution: saline Irrigation method: syringe Amount of cleaning: standard Debridement: none Degree of undermining: none Skin closure: 5-0 Prolene Number of sutures: 4 Technique: simple Approximation: close Approximation difficulty: simple Dressing: 4x4 sterile gauze Patient tolerance: Patient tolerated the procedure well with no immediate complications.   (including critical care time)   Labs Reviewed  CBC WITH DIFFERENTIAL  COMPREHENSIVE METABOLIC PANEL  TROPONIN I   Dg Chest 2 View  06/21/2012  *RADIOLOGY REPORT*  Clinical Data: History of trauma  from a fall.  CHEST - 2 VIEW  Comparison: Chest x-ray 03/27/2012.  Findings: Lung volumes are slightly low.  No pneumothorax.  No acute consolidative airspace disease.  No pleural effusions.  Mild elevation of the right hemidiaphragm.  Pulmonary vasculature is normal.  Heart size appears upper limits of normal. The patient is rotated to the right on today's exam, resulting in distortion of the mediastinal contours and reduced diagnostic sensitivity and specificity for mediastinal pathology.  Atherosclerosis in the thoracic aorta. No definite acute displaced rib fractures are identified.  IMPRESSION: 1.  No evidence of significant acute traumatic injury to the thorax. 2.  Atherosclerosis.   Original Report Authenticated By: Trudie Reed, M.D.    Dg Pelvis 1-2 Views  06/21/2012  *RADIOLOGY REPORT*  Clinical Data: Fall.  PELVIS - 1-2 VIEW  Comparison: 01/31/2012.  Findings: AP view of the pelvis demonstrates no definite acute displaced fracture, subluxation, dislocation, joint or soft tissue abnormality.  Two ossific densities are seen projecting over the ilium bilaterally, likely to represent injection granulomas. Multiple phleboliths are incidentally noted in the pelvis.  IMPRESSION: No definite acute radiographic abnormality of the bony pelvis.   Original Report Authenticated By: Trudie Reed, M.D.    Dg Tibia/fibula Left  06/21/2012  *RADIOLOGY REPORT*  Clinical Data: History of fall complaining of leg pain.  LEFT TIBIA AND FIBULA - 2 VIEW  Comparison: No priors.  Findings: AP and lateral views of the left tibia and fibula demonstrate no acute displaced fracture.  Soft tissues are unremarkable.  IMPRESSION: 1.  No acute displaced fracture of the left tibia or fibula.  Original Report Authenticated By: Trudie Reed, M.D.    Dg Tibia/fibula Right  06/21/2012  *RADIOLOGY REPORT*  Clinical Data: History of fall complaining of right leg pain.  RIGHT TIBIA AND FIBULA - 2 VIEW  Comparison: No priors.   Findings: AP and lateral views of the right tibia and fibula demonstrate no acute displaced fractures.  Soft tissues are unremarkable.  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  IMPRESSION: 1.  No acute radiographic abnormality of the right tibia or fibula.   Original Report Authenticated By: Trudie Reed, M.D.    Ct Head Wo Contrast  06/21/2012  *RADIOLOGY REPORT*  Clinical Data:  History of trauma from a fall with injury to the head and face.  CT HEAD WITHOUT CONTRAST CT MAXILLOFACIAL WITHOUT CONTRAST CT CERVICAL SPINE WITHOUT CONTRAST  Technique:  Multidetector CT imaging of the head, cervical spine, and maxillofacial structures were performed using the standard protocol without intravenous contrast. Multiplanar CT image reconstructions of the cervical spine and maxillofacial structures were also generated.  Comparison:  Head CT 04/03/2012.  CT HEAD  Findings: Mild cerebral and cerebellar atrophy with ex vacuo dilatation of the ventricular system.  There are some patchy and confluent areas of decreased attenuation throughout the deep and periventricular white matter of the cerebral hemispheres bilaterally, most compatible with chronic microvascular ischemic disease. No acute displaced skull fractures are identified.  No acute intracranial abnormality.  Specifically, no evidence of acute post-traumatic intracranial hemorrhage, no definite regions of acute/subacute cerebral ischemia, no focal mass, mass effect, hydrocephalus or abnormal intra or extra-axial fluid collections. The visualized paranasal sinuses and mastoids are generally well pneumatized, with exception of some high attenuation fluid layering dependently in the right maxillary sinus, and some minimal mucosal thickening in the ethmoid sinuses bilaterally.  Status post bilateral maxillary antrectomy.  IMPRESSION: 1.  No acute displaced skull fractures or acute intracranial abnormalities. 2.  Mild cerebral and cerebellar atrophy with  chronic microvascular ischemic changes in the white matter redemonstrated, as above. 3.  Possible small amount of hemosinus in the right maxillary sinus.  CT MAXILLOFACIAL  Findings:  Nondisplaced left nasal bone fracture with some gas in the overlying soft tissues, suggesting the presence of a laceration.  There is some fluid and soft tissue thickening adjacent to the anterior aspect of the bony nasal septum.  There also appears to be a nondisplaced fracture of the anterior aspect of the bony nasal septum.  A small amount of high attenuation fluid in the posterior aspect of the right maxillary sinus likely represent a small amount of hemosinus.  Mild mucosal thickening throughout the ethmoid sinuses bilaterally.  No other acute displaced facial bone fractures are identified.  Mandibular condyles are located bilaterally.  IMPRESSION: 1.  Nondisplaced left nasal bone fracture and nondisplaced fracture through the anterior aspect of the bony nasal septum. 2.  Gas in the soft tissues overlying the nasal bridge suggesting the presence of a laceration. 3.  Small volume of hemosinus of the right maxillary sinus. 4.  Status post bilateral maxillary antrectomy.  CT CERVICAL SPINE  Findings:   No acute displaced cervical spine fracture.  Alignment is anatomic.  Prevertebral soft tissues are normal.  Visualized portions of the upper thorax are unremarkable. Mild multilevel degenerative disc disease, most pronounced at C4-C5 and C5-C6.  IMPRESSION: 1.  No evidence of significant acute traumatic injury to the cervical spine.   Original Report Authenticated By: Trudie Reed, M.D.    Ct Cervical Spine Wo Contrast  06/21/2012  *RADIOLOGY REPORT*  Clinical Data:  History of trauma from a fall with injury to the head and face.  CT HEAD WITHOUT CONTRAST CT MAXILLOFACIAL WITHOUT CONTRAST CT CERVICAL SPINE WITHOUT CONTRAST  Technique:  Multidetector CT imaging of the head, cervical spine, and maxillofacial structures were  performed using the standard protocol without intravenous contrast. Multiplanar CT image reconstructions of the cervical spine and maxillofacial structures were also generated.  Comparison:  Head CT 04/03/2012.  CT HEAD  Findings: Mild cerebral and cerebellar atrophy with ex vacuo dilatation of the ventricular system.  There are some patchy and confluent areas of decreased attenuation throughout the deep and periventricular white matter of the cerebral hemispheres bilaterally, most compatible with chronic microvascular ischemic disease. No acute displaced skull fractures are identified.  No acute intracranial abnormality.  Specifically, no evidence of acute post-traumatic intracranial hemorrhage, no definite regions of acute/subacute cerebral ischemia, no focal mass, mass effect, hydrocephalus or abnormal intra or extra-axial fluid collections. The visualized paranasal sinuses and mastoids are generally well pneumatized, with exception of some high attenuation fluid layering dependently in the right maxillary sinus, and some minimal mucosal thickening in the ethmoid sinuses bilaterally.  Status post bilateral maxillary antrectomy.  IMPRESSION: 1.  No acute displaced skull fractures or acute intracranial abnormalities. 2.  Mild cerebral and cerebellar atrophy with chronic microvascular ischemic changes in the white matter redemonstrated, as above. 3.  Possible small amount of hemosinus in the right maxillary sinus.  CT MAXILLOFACIAL  Findings:  Nondisplaced left nasal bone fracture with some gas in the overlying soft tissues, suggesting the presence of a laceration.  There is some fluid and soft tissue thickening adjacent to the anterior aspect of the bony nasal septum.  There also appears to be a nondisplaced fracture of the anterior aspect of the bony nasal septum.  A small amount of high attenuation fluid in the posterior aspect of the right maxillary sinus likely represent a small amount of hemosinus.  Mild  mucosal thickening throughout the ethmoid sinuses bilaterally.  No other acute displaced facial bone fractures are identified.  Mandibular condyles are located bilaterally.  IMPRESSION: 1.  Nondisplaced left nasal bone fracture and nondisplaced fracture through the anterior aspect of the bony nasal septum. 2.  Gas in the soft tissues overlying the nasal bridge suggesting the presence of a laceration. 3.  Small volume of hemosinus of the right maxillary sinus. 4.  Status post bilateral maxillary antrectomy.  CT CERVICAL SPINE  Findings:   No acute displaced cervical spine fracture.  Alignment is anatomic.  Prevertebral soft tissues are normal.  Visualized portions of the upper thorax are unremarkable. Mild multilevel degenerative disc disease, most pronounced at C4-C5 and C5-C6.  IMPRESSION: 1.  No evidence of significant acute traumatic injury to the cervical spine.   Original Report Authenticated By: Trudie Reed, M.D.    Ct Maxillofacial Wo Cm  06/21/2012  *RADIOLOGY REPORT*  Clinical Data:  History of trauma from a fall with injury to the head and face.  CT HEAD WITHOUT CONTRAST CT MAXILLOFACIAL WITHOUT CONTRAST CT CERVICAL SPINE WITHOUT CONTRAST  Technique:  Multidetector CT imaging of the head, cervical spine, and maxillofacial structures were performed using the standard protocol without intravenous contrast. Multiplanar CT image reconstructions of the cervical spine and maxillofacial structures were also generated.  Comparison:  Head CT 04/03/2012.  CT HEAD  Findings: Mild cerebral and cerebellar atrophy with ex vacuo dilatation of the ventricular system.  There are some patchy and confluent areas  of decreased attenuation throughout the deep and periventricular white matter of the cerebral hemispheres bilaterally, most compatible with chronic microvascular ischemic disease. No acute displaced skull fractures are identified.  No acute intracranial abnormality.  Specifically, no evidence of acute  post-traumatic intracranial hemorrhage, no definite regions of acute/subacute cerebral ischemia, no focal mass, mass effect, hydrocephalus or abnormal intra or extra-axial fluid collections. The visualized paranasal sinuses and mastoids are generally well pneumatized, with exception of some high attenuation fluid layering dependently in the right maxillary sinus, and some minimal mucosal thickening in the ethmoid sinuses bilaterally.  Status post bilateral maxillary antrectomy.  IMPRESSION: 1.  No acute displaced skull fractures or acute intracranial abnormalities. 2.  Mild cerebral and cerebellar atrophy with chronic microvascular ischemic changes in the white matter redemonstrated, as above. 3.  Possible small amount of hemosinus in the right maxillary sinus.  CT MAXILLOFACIAL  Findings:  Nondisplaced left nasal bone fracture with some gas in the overlying soft tissues, suggesting the presence of a laceration.  There is some fluid and soft tissue thickening adjacent to the anterior aspect of the bony nasal septum.  There also appears to be a nondisplaced fracture of the anterior aspect of the bony nasal septum.  A small amount of high attenuation fluid in the posterior aspect of the right maxillary sinus likely represent a small amount of hemosinus.  Mild mucosal thickening throughout the ethmoid sinuses bilaterally.  No other acute displaced facial bone fractures are identified.  Mandibular condyles are located bilaterally.  IMPRESSION: 1.  Nondisplaced left nasal bone fracture and nondisplaced fracture through the anterior aspect of the bony nasal septum. 2.  Gas in the soft tissues overlying the nasal bridge suggesting the presence of a laceration. 3.  Small volume of hemosinus of the right maxillary sinus. 4.  Status post bilateral maxillary antrectomy.  CT CERVICAL SPINE  Findings:   No acute displaced cervical spine fracture.  Alignment is anatomic.  Prevertebral soft tissues are normal.  Visualized portions  of the upper thorax are unremarkable. Mild multilevel degenerative disc disease, most pronounced at C4-C5 and C5-C6.  IMPRESSION: 1.  No evidence of significant acute traumatic injury to the cervical spine.   Original Report Authenticated By: Trudie Reed, M.D.      No diagnosis found.    MDM  Mechanical fall with facial injury. No loss of consciousness. Vital stable, no distress. No use of anticoagulants.  Imaging remarkable for nasal bone fracture, nasal septal fracture. Upper lip laceration repaired as above. Inner mucosal lower lip laceration not repaired.  Tetanus updated.  Tolerating PO in ED.  Ambulatory with minimal assistance. Follow up with ENT regarding nasal fracture. Sinus precautions is discussed, given antibiotics, pain control, suture removal in one week.     Date: 06/21/2012  Rate: 58  Rhyt andhm: normal sinus rhythm  QRS Axis: normal  Intervals: normal  ST/T Wave abnormalities: normal  Conduction Disutrbances:none  Narrative Interpretation:   Old EKG Reviewed: none available    Glynn Octave, MD 06/21/12 [id number]

## 2012-06-26 ENCOUNTER — Ambulatory Visit (INDEPENDENT_AMBULATORY_CARE_PROVIDER_SITE_OTHER): Payer: Medicare Other | Admitting: Family Medicine

## 2012-06-26 ENCOUNTER — Encounter: Payer: Self-pay | Admitting: Family Medicine

## 2012-06-26 VITALS — BP 110/70 | HR 72 | Temp 97.6°F | Wt 148.0 lb

## 2012-06-26 DIAGNOSIS — R82998 Other abnormal findings in urine: Secondary | ICD-10-CM

## 2012-06-26 DIAGNOSIS — S022XXA Fracture of nasal bones, initial encounter for closed fracture: Secondary | ICD-10-CM

## 2012-06-26 DIAGNOSIS — R8290 Unspecified abnormal findings in urine: Secondary | ICD-10-CM

## 2012-06-26 DIAGNOSIS — W19XXXA Unspecified fall, initial encounter: Secondary | ICD-10-CM | POA: Insufficient documentation

## 2012-06-26 LAB — POCT URINALYSIS DIPSTICK
Bilirubin, UA: NEGATIVE
Blood, UA: NEGATIVE
Glucose, UA: NEGATIVE
Ketones, UA: NEGATIVE
Nitrite, UA: NEGATIVE
Spec Grav, UA: 1.015
Urobilinogen, UA: NEGATIVE
pH, UA: 7.5

## 2012-06-26 MED ORDER — SIMVASTATIN 80 MG PO TABS: 80.0000 mg | ORAL_TABLET | Freq: Every day | ORAL | Status: DC

## 2012-06-26 MED ORDER — LOSARTAN POTASSIUM-HCTZ 50-12.5 MG PO TABS: ORAL_TABLET | ORAL | Status: DC

## 2012-06-26 MED ORDER — FLUOXETINE HCL 20 MG PO CAPS: 20.0000 mg | ORAL_CAPSULE | Freq: Every day | ORAL | Status: DC

## 2012-06-26 NOTE — Progress Notes (Signed)
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  Note reviewed.  She was walking into a restaurant and tripped on a curb hitting her face on the pavement. This was witnessed by her daughter. There was no loss of consciousness. She did assistance to get up. She's not had vomiting. Was taken to [facility] on 06/21/2012.  Laceration of lip required 4 sutures.  Left nasal non displaced fracture found on CT.  ENT evaluated her in ER, she has follow up appointment with ENT on Thursday.  All other imaging, including CXR, Tib/fib xray, pelvic xray, cervical and head CT without acute findings.  Dg Chest 2 View  06/21/2012  *RADIOLOGY REPORT*  Clinical Data: History of trauma from a fall.  CHEST - 2 VIEW  Comparison: Chest x-ray 03/27/2012.  Findings: Lung volumes are slightly low.  No pneumothorax.  No acute consolidative airspace disease.  No pleural effusions.  Mild elevation of the right hemidiaphragm.  Pulmonary vasculature is normal.  Heart size appears upper limits of normal. The patient is rotated to the right on today's exam, resulting in distortion of the mediastinal contours and reduced diagnostic sensitivity and specificity for mediastinal pathology.  Atherosclerosis in the thoracic aorta. No definite acute displaced rib fractures are identified.  IMPRESSION: 1.  No evidence of significant acute traumatic injury to the thorax. 2.  Atherosclerosis.   Original Report Authenticated By: Trudie Reed, M.D.    Dg Pelvis 1-2 Views  06/21/2012  *RADIOLOGY REPORT*  Clinical Data: Fall.  PELVIS - 1-2 VIEW  Comparison: 01/31/2012.  Findings: AP view of the pelvis demonstrates no definite acute displaced fracture, subluxation, dislocation, joint or soft tissue abnormality.  Two ossific densities are seen projecting over the ilium bilaterally, likely to represent injection granulomas. Multiple phleboliths are incidentally noted in the pelvis.  IMPRESSION: No definite acute radiographic abnormality  of the bony pelvis.   Original Report Authenticated By: Trudie Reed, M.D.    Dg Tibia/fibula Left  06/21/2012  *RADIOLOGY REPORT*  Clinical Data: History of fall complaining of leg pain.  LEFT TIBIA AND FIBULA - 2 VIEW  Comparison: No priors.  Findings: AP and lateral views of the left tibia and fibula demonstrate no acute displaced fracture.  Soft tissues are unremarkable.  IMPRESSION: 1.  No acute displaced fracture of the left tibia or fibula.   Original Report Authenticated By: Trudie Reed, M.D.    Dg Tibia/fibula Right  06/21/2012  *RADIOLOGY REPORT*  Clinical Data: History of fall complaining of right leg pain.  RIGHT TIBIA AND FIBULA - 2 VIEW  Comparison: No priors.  Findings: AP and lateral views of the right tibia and fibula demonstrate no acute displaced fractures.  Soft tissues are unremarkable.  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  IMPRESSION: 1.  No acute radiographic abnormality of the right tibia or fibula.   Original Report Authenticated By: Trudie Reed, M.D.    Ct Head Wo Contrast  06/21/2012  *RADIOLOGY REPORT*  Clinical Data:  History of trauma from a fall with injury to the head and face.  CT HEAD WITHOUT CONTRAST CT MAXILLOFACIAL WITHOUT CONTRAST CT CERVICAL SPINE WITHOUT CONTRAST  Technique:  Multidetector CT imaging of the head, cervical spine, and maxillofacial structures were performed using the standard protocol without intravenous contrast. Multiplanar CT image reconstructions of the cervical spine and maxillofacial structures were also generated.  Comparison:  Head CT 04/03/2012.  CT HEAD  Findings: Mild cerebral and cerebellar atrophy with ex vacuo dilatation  of the ventricular system.  There are some patchy and confluent areas of decreased attenuation throughout the deep and periventricular white matter of the cerebral hemispheres bilaterally, most compatible with chronic microvascular ischemic disease. No acute displaced skull fractures are  identified.  No acute intracranial abnormality.  Specifically, no evidence of acute post-traumatic intracranial hemorrhage, no definite regions of acute/subacute cerebral ischemia, no focal mass, mass effect, hydrocephalus or abnormal intra or extra-axial fluid collections. The visualized paranasal sinuses and mastoids are generally well pneumatized, with exception of some high attenuation fluid layering dependently in the right maxillary sinus, and some minimal mucosal thickening in the ethmoid sinuses bilaterally.  Status post bilateral maxillary antrectomy.  IMPRESSION: 1.  No acute displaced skull fractures or acute intracranial abnormalities. 2.  Mild cerebral and cerebellar atrophy with chronic microvascular ischemic changes in the white matter redemonstrated, as above. 3.  Possible small amount of hemosinus in the right maxillary sinus.  CT MAXILLOFACIAL  Findings:  Nondisplaced left nasal bone fracture with some gas in the overlying soft tissues, suggesting the presence of a laceration.  There is some fluid and soft tissue thickening adjacent to the anterior aspect of the bony nasal septum.  There also appears to be a nondisplaced fracture of the anterior aspect of the bony nasal septum.  A small amount of high attenuation fluid in the posterior aspect of the right maxillary sinus likely represent a small amount of hemosinus.  Mild mucosal thickening throughout the ethmoid sinuses bilaterally.  No other acute displaced facial bone fractures are identified.  Mandibular condyles are located bilaterally.  IMPRESSION: 1.  Nondisplaced left nasal bone fracture and nondisplaced fracture through the anterior aspect of the bony nasal septum. 2.  Gas in the soft tissues overlying the nasal bridge suggesting the presence of a laceration. 3.  Small volume of hemosinus of the right maxillary sinus. 4.  Status post bilateral maxillary antrectomy.  CT CERVICAL SPINE  Findings:   No acute displaced cervical spine fracture.   Alignment is anatomic.  Prevertebral soft tissues are normal.  Visualized portions of the upper thorax are unremarkable. Mild multilevel degenerative disc disease, most pronounced at C4-C5 and C5-C6.  IMPRESSION: 1.  No evidence of significant acute traumatic injury to the cervical spine.   Original Report Authenticated By: Trudie Reed, M.D.    Ct Cervical Spine Wo Contrast  06/21/2012  *RADIOLOGY REPORT*  Clinical Data:  History of trauma from a fall with injury to the head and face.  CT HEAD WITHOUT CONTRAST CT MAXILLOFACIAL WITHOUT CONTRAST CT CERVICAL SPINE WITHOUT CONTRAST  Technique:  Multidetector CT imaging of the head, cervical spine, and maxillofacial structures were performed using the standard protocol without intravenous contrast. Multiplanar CT image reconstructions of the cervical spine and maxillofacial structures were also generated.  Comparison:  Head CT 04/03/2012.  CT HEAD  Findings: Mild cerebral and cerebellar atrophy with ex vacuo dilatation of the ventricular system.  There are some patchy and confluent areas of decreased attenuation throughout the deep and periventricular white matter of the cerebral hemispheres bilaterally, most compatible with chronic microvascular ischemic disease. No acute displaced skull fractures are identified.  No acute intracranial abnormality.  Specifically, no evidence of acute post-traumatic intracranial hemorrhage, no definite regions of acute/subacute cerebral ischemia, no focal mass, mass effect, hydrocephalus or abnormal intra or extra-axial fluid collections. The visualized paranasal sinuses and mastoids are generally well pneumatized, with exception of some high attenuation fluid layering dependently in the right maxillary sinus, and some minimal mucosal  thickening in the ethmoid sinuses bilaterally.  Status post bilateral maxillary antrectomy.  IMPRESSION: 1.  No acute displaced skull fractures or acute intracranial abnormalities. 2.  Mild  cerebral and cerebellar atrophy with chronic microvascular ischemic changes in the white matter redemonstrated, as above. 3.  Possible small amount of hemosinus in the right maxillary sinus.  CT MAXILLOFACIAL  Findings:  Nondisplaced left nasal bone fracture with some gas in the overlying soft tissues, suggesting the presence of a laceration.  There is some fluid and soft tissue thickening adjacent to the anterior aspect of the bony nasal septum.  There also appears to be a nondisplaced fracture of the anterior aspect of the bony nasal septum.  A small amount of high attenuation fluid in the posterior aspect of the right maxillary sinus likely represent a small amount of hemosinus.  Mild mucosal thickening throughout the ethmoid sinuses bilaterally.  No other acute displaced facial bone fractures are identified.  Mandibular condyles are located bilaterally.  IMPRESSION: 1.  Nondisplaced left nasal bone fracture and nondisplaced fracture through the anterior aspect of the bony nasal septum. 2.  Gas in the soft tissues overlying the nasal bridge suggesting the presence of a laceration. 3.  Small volume of hemosinus of the right maxillary sinus. 4.  Status post bilateral maxillary antrectomy.  CT CERVICAL SPINE  Findings:   No acute displaced cervical spine fracture.  Alignment is anatomic.  Prevertebral soft tissues are normal.  Visualized portions of the upper thorax are unremarkable. Mild multilevel degenerative disc disease, most pronounced at C4-C5 and C5-C6.  IMPRESSION: 1.  No evidence of significant acute traumatic injury to the cervical spine.   Original Report Authenticated By: Trudie Reed, M.D.    Ct Maxillofacial Wo Cm  06/21/2012  *RADIOLOGY REPORT*  Clinical Data:  History of trauma from a fall with injury to the head and face.  CT HEAD WITHOUT CONTRAST CT MAXILLOFACIAL WITHOUT CONTRAST CT CERVICAL SPINE WITHOUT CONTRAST  Technique:  Multidetector CT imaging of the head, cervical spine, and  maxillofacial structures were performed using the standard protocol without intravenous contrast. Multiplanar CT image reconstructions of the cervical spine and maxillofacial structures were also generated.  Comparison:  Head CT 04/03/2012.  CT HEAD  Findings: Mild cerebral and cerebellar atrophy with ex vacuo dilatation of the ventricular system.  There are some patchy and confluent areas of decreased attenuation throughout the deep and periventricular white matter of the cerebral hemispheres bilaterally, most compatible with chronic microvascular ischemic disease. No acute displaced skull fractures are identified.  No acute intracranial abnormality.  Specifically, no evidence of acute post-traumatic intracranial hemorrhage, no definite regions of acute/subacute cerebral ischemia, no focal mass, mass effect, hydrocephalus or abnormal intra or extra-axial fluid collections. The visualized paranasal sinuses and mastoids are generally well pneumatized, with exception of some high attenuation fluid layering dependently in the right maxillary sinus, and some minimal mucosal thickening in the ethmoid sinuses bilaterally.  Status post bilateral maxillary antrectomy.  IMPRESSION: 1.  No acute displaced skull fractures or acute intracranial abnormalities. 2.  Mild cerebral and cerebellar atrophy with chronic microvascular ischemic changes in the white matter redemonstrated, as above. 3.  Possible small amount of hemosinus in the right maxillary sinus.  CT MAXILLOFACIAL  Findings:  Nondisplaced left nasal bone fracture with some gas in the overlying soft tissues, suggesting the presence of a laceration.  There is some fluid and soft tissue thickening adjacent to the anterior aspect of the bony nasal septum.  There also appears  to be a nondisplaced fracture of the anterior aspect of the bony nasal septum.  A small amount of high attenuation fluid in the posterior aspect of the right maxillary sinus likely represent a small  amount of hemosinus.  Mild mucosal thickening throughout the ethmoid sinuses bilaterally.  No other acute displaced facial bone fractures are identified.  Mandibular condyles are located bilaterally.  IMPRESSION: 1.  Nondisplaced left nasal bone fracture and nondisplaced fracture through the anterior aspect of the bony nasal septum. 2.  Gas in the soft tissues overlying the nasal bridge suggesting the presence of a laceration. 3.  Small volume of hemosinus of the right maxillary sinus. 4.  Status post bilateral maxillary antrectomy.  CT CERVICAL SPINE  Findings:   No acute displaced cervical spine fracture.  Alignment is anatomic.  Prevertebral soft tissues are normal.  Visualized portions of the upper thorax are unremarkable. Mild multilevel degenerative disc disease, most pronounced at C4-C5 and C5-C6.  IMPRESSION: 1.  No evidence of significant acute traumatic injury to the cervical spine.   Original Report Authenticated By: Trudie Reed, M.D.         Current Outpatient Prescriptions on File Prior to Visit  Medication Sig Dispense Refill  . acetaminophen (TYLENOL) 325 MG tablet Take 2 tablets (650 mg total) by mouth every 6 (six) hours as needed for pain or fever (or Fever >/= 101).  30 tablet  0  . ALPRAZolam (XANAX) 0.5 MG tablet Take 1 tablet (0.5 mg total) by mouth 2 (two) times daily.  30 tablet  0  . aspirin 81 MG tablet Take 81 mg by mouth daily.       . carbidopa-levodopa (SINEMET IR) 25-100 MG per tablet Take 3 tablets by mouth 3 (three) times daily.      . Cholecalciferol (VITAMIN D3) 1000 UNITS CAPS Take 1 capsule by mouth daily.        . feeding supplement (ENSURE COMPLETE) LIQD Take 237 mLs by mouth 2 (two) times daily between meals.  1 Bottle  11  . FLUoxetine (PROZAC) 20 MG capsule Take 1 capsule (20 mg total) by mouth daily.  90 capsule  1  . HYDROcodone-acetaminophen (NORCO/VICODIN) 5-325 MG per tablet Take 2 tablets by mouth every 4 (four) hours as needed for pain.  10 tablet   0  . hydroxypropyl methylcellulose (ISOPTO TEARS) 2.5 % ophthalmic solution Place 1 drop into both eyes daily.      Marland Kitchen levETIRAcetam (KEPPRA) 500 MG tablet Take 1 tablet (500 mg total) by mouth daily.  90 tablet  1  . losartan-hydrochlorothiazide (HYZAAR) 50-12.5 MG per tablet TAKE 1 TABLET DAILY  90 tablet  3  . mirtazapine (REMERON) 30 MG tablet Take 1 tablet (30 mg total) by mouth at bedtime.  30 tablet  3  . Multiple Vitamin (MULTIVITAMIN WITH MINERALS) TABS Take 1 tablet by mouth daily.      . simvastatin (ZOCOR) 80 MG tablet Take 80 mg by mouth at bedtime.      . sulfamethoxazole-trimethoprim (SEPTRA DS) 800-160 MG per tablet Take 1 tablet by mouth 2 (two) times daily.  20 tablet  0  . vitamin B-12 (CYANOCOBALAMIN) 1000 MCG tablet Take 1,000 mcg by mouth daily.          Allergies  Allergen Reactions  . Codeine Sulfate     REACTION: hallucinations  . Penicillins Anaphylaxis, Swelling and Rash  . Wellbutrin (Bupropion) Other (See Comments)    Seizures and hallucinations    ROS:  See HPI NO dizziness or confusion prior to fall -" I just tripped!"   Physical exam: BP 110/70  Pulse 72  Temp 97.6 F (36.4 C)  Wt 148 lb (67.132 kg)  General: Well-developed,well-nourished,in no acute distress; alert,appropriate and cooperative throughout examination, minimally overweight.  HEENT: Diffuse ecchymosis of head, neck, bridge of noses and bilateral cheeks. 4 sutures above left side of lip- c/d/i Lungs: Normal respiratory effort, chest expands symmetrically. Lungs are clear to auscultation, no crackles or wheezes.  Heart: Normal rate and regular rhythm. S1 and S2 normal without gallop, murmur, click, rub or other extra sounds.  Abdomen: Bowel sounds positive,abdomen soft and non-tender without masses, organomegaly or hernias noted. NO CVA or suprapubic tenderness.  Extremities: No clubbing, cyanosis, edema, or deformity noted with normal full range of motion of all joints.   Neurologic: Shuffled gait improved, NO TREMOR, Psych: Cognition and judgment appear intact. Alert and cooperative with normal attention span and concentration. No apparent delusions, illusions, hallucinations  Assessment and Plan:  1. Nasal fracture- Non displaced.  Pain well controlled.  Keep follow up with ENT this week.    2. Fall  Seems unrelated to her PD.  Tripped over curb.  No further work up needed at this time.   3. Abnormal urine odor  UA neg (only trace LE)- pt and daughter reassured and will confirm with urine cx. POCT urinalysis dipstick

## 2012-06-26 NOTE — Addendum Note (Signed)
Addended by: Eliezer Bottom on: 06/26/2012 04:24 PM   Modules accepted: Orders

## 2012-06-27 LAB — URINE CULTURE
Colony Count: NO GROWTH
Organism ID, Bacteria: NO GROWTH

## 2012-07-04 ENCOUNTER — Other Ambulatory Visit: Payer: Self-pay

## 2012-07-04 MED ORDER — ALPRAZOLAM 0.5 MG PO TABS: 0.5000 mg | ORAL_TABLET | Freq: Two times a day (BID) | ORAL | Status: DC

## 2012-07-04 NOTE — Telephone Encounter (Signed)
Medicine called [facility]. 

## 2012-07-04 NOTE — Telephone Encounter (Signed)
Pts daughter Larena Sox request refill alprazolam to [facility] [address]Please advise.

## 2012-07-05 DIAGNOSIS — G219 Secondary parkinsonism, unspecified: Secondary | ICD-10-CM

## 2012-07-05 DIAGNOSIS — IMO0001 Reserved for inherently not codable concepts without codable children: Secondary | ICD-10-CM

## 2012-07-05 DIAGNOSIS — I10 Essential (primary) hypertension: Secondary | ICD-10-CM

## 2012-07-13 ENCOUNTER — Telehealth: Payer: Self-pay

## 2012-07-13 NOTE — Telephone Encounter (Signed)
[name] with [employer] request records from 03/2012. Advised need to send record release form and would be sent to [facility].[name] voiced understanding.

## 2012-08-15 ENCOUNTER — Other Ambulatory Visit: Payer: Self-pay | Admitting: Family Medicine

## 2012-08-16 NOTE — Telephone Encounter (Signed)
Medicine called to pharmacy. 

## 2012-09-12 ENCOUNTER — Other Ambulatory Visit: Payer: Self-pay | Admitting: Family Medicine

## 2012-09-13 NOTE — Telephone Encounter (Signed)
Medicine called [facility]. 

## 2012-09-15 ENCOUNTER — Ambulatory Visit (INDEPENDENT_AMBULATORY_CARE_PROVIDER_SITE_OTHER): Payer: Medicare Other | Admitting: Neurology

## 2012-09-15 ENCOUNTER — Encounter: Payer: Self-pay | Admitting: Neurology

## 2012-09-15 VITALS — BP 110/72 | HR 76 | Temp 98.0°F | Resp 18 | Wt 146.0 lb

## 2012-09-15 DIAGNOSIS — G219 Secondary parkinsonism, unspecified: Secondary | ICD-10-CM

## 2012-09-15 DIAGNOSIS — F329 Major depressive disorder, single episode, unspecified: Secondary | ICD-10-CM

## 2012-09-15 DIAGNOSIS — F3289 Other specified depressive episodes: Secondary | ICD-10-CM

## 2012-09-15 MED ORDER — CARBIDOPA-LEVODOPA 25-100 MG PO TABS: 3.0000 | ORAL_TABLET | Freq: Three times a day (TID) | ORAL | Status: DC

## 2012-09-15 NOTE — Patient Instructions (Addendum)
Your appointment with Dr. Dellia Cloud is scheduled for Tuesday, March 4th at 9:00am.  [phone].  We will see you back in five months.

## 2012-09-15 NOTE — Progress Notes (Signed)
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  This patient is accompanied in the office by her child who supplements the history.      She is on carbidopa/levodopa 25/100, 3 tablets 3 times per day.  She is doing well on this medication, without side effects.  She had one fall the day before Thanksgiving.  She was in a parking lot of a restaurant and tripped over something(curb).  She fx her nose and had to have her lip stitched up.    Since then, however, she has done very well.  Her daughter reports that she has been very stable on her feet.  Last visit, she was complaining of a decreased appetite and increased anxiety.  Therefore, I increased her Remeron to 30 mg at night.  This helped with both sleep and appetite and she is very pleased.  She also has a hx of seizure, likely secondary to Wellbutrin.  Last visit, her keppra was d/c.  She has been seizure free since that time.  However, mood and depression continue to be an issue.  I sent her to Dr. Dellia Cloud but she didn't follow up.  She states that she cannot talk to him without her daughter present.  Her biggest issue is that her granddaughter is a [employer] loss adjuster and she lives with the patient.  Apparently, the majority of the house is clean and safe with the exception of the granddaughters room.  The patient states that the mess just drives her crazy and she cannot stop thinking about it.  Her granddaughter goes to community college and her leaving the house is not an option.  Wearing off:  no  How long before next dose:  n/a Falls:   yes, last in Nov 2013 N/V:  no Hallucinations:  no  visual distortions: no N/V:  no Lightheaded:  no  Syncope: no Dyskinesia:  no  Neuroimaging has previously been performed.   It was done on 04/23/11 and demonstrated significant atrophy especially in the temporal regions and moderate small vessel disease.  Gad was not given.   PREVIOUS  MEDICATIONS: Sinemet  ALLERGIES:   Allergies  Allergen Reactions  . Codeine Sulfate     REACTION: hallucinations  . Penicillins Anaphylaxis, Swelling and Rash  . Wellbutrin (Bupropion) Other (See Comments)    Seizures and hallucinations    CURRENT MEDICATIONS:  Current Outpatient Prescriptions on File Prior to Visit  Medication Sig Dispense Refill  . acetaminophen (TYLENOL) 325 MG tablet Take 2 tablets (650 mg total) by mouth every 6 (six) hours as needed for pain or fever (or Fever >/= 101).  30 tablet  0  . ALPRAZolam (XANAX) 0.5 MG tablet TAKE 1 TABLET TWICE A DAY  60 tablet  0  . aspirin 81 MG tablet Take 81 mg by mouth daily.       . carbidopa-levodopa (SINEMET IR) 25-100 MG per tablet Take 3 tablets by mouth 3 (three) times daily.      . Cholecalciferol (VITAMIN D3) 1000 UNITS CAPS Take 1 capsule by mouth daily.        . feeding supplement (ENSURE COMPLETE) LIQD Take 237 mLs by mouth 2 (two) times daily between meals.  1 Bottle  11  . FLUoxetine (PROZAC) 20 MG capsule Take 1 capsule (20 mg total) by mouth daily.  90 capsule  0  . HYDROcodone-acetaminophen (NORCO/VICODIN) 5-325 MG per tablet Take 2 tablets by mouth every 4 (four) hours  as needed for pain.  10 tablet  0  . hydroxypropyl methylcellulose (ISOPTO TEARS) 2.5 % ophthalmic solution Place 1 drop into both eyes daily.      Marland Kitchen losartan-hydrochlorothiazide (HYZAAR) 50-12.5 MG per tablet Take one by mouth daily  90 tablet  1  . mirtazapine (REMERON) 30 MG tablet Take 1 tablet (30 mg total) by mouth at bedtime.  30 tablet  3  . Multiple Vitamin (MULTIVITAMIN WITH MINERALS) TABS Take 1 tablet by mouth daily.      . simvastatin (ZOCOR) 80 MG tablet Take 1 tablet (80 mg total) by mouth at bedtime.  90 tablet  1  . vitamin B-12 (CYANOCOBALAMIN) 1000 MCG tablet Take 1,000 mcg by mouth daily.         No current facility-administered medications on file prior to visit.    PAST MEDICAL HISTORY:   Past Medical History  Diagnosis  Date  . Dysmetabolic syndrome X   . Personal history of thrombophlebitis   . Essential hypertension, benign   . Morbid obesity   . Mixed hyperlipidemia   . Depressive disorder, not elsewhere classified   . Bell's palsy     1985  . Adjustment disorder with depressed mood   . DVT (deep venous thrombosis), left "early 2000's"    RLE  . Syncope and collapse 12/06/11    "this am; I pass out fairly often"  . Type II or unspecified type diabetes mellitus without mention of complication, not stated as uncontrolled 12/06/11    "not anymore; had lap band OR"  . Neurodegenerative gait disorder   . Headache 05/2011    "pretty often since they put me on Parkinson's medicine"  . Parkinson disease     "they think that's what it is"  . DEMENTIA   . 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 Anxiety   . History of shingles     "as a teen and in my 35's"    PAST SURGICAL HISTORY:   Past Surgical History  Procedure Laterality Date  . Tonsillectomy and adenoidectomy  1970  . Bladder tack  11/1995  . Septoplasty  08/1998    with antral window   . Knee arthroscopy  12/12/2002    right   . Fracture surgery  02/09/2006    bilateral elbows  . Fracture surgery      right knee  . Fracture surgery      tib plateau  . Breast cyst aspiration  12/1998    bilaterally  . Breast biopsy  05/20/2000    bilaterally  . Tubal ligation  1970's  . Cataract extraction w/ intraocular lens  implant, bilateral    . Laparoscopic gastric banding  2008  . Cardiac catheterization  2004    SOCIAL HISTORY:   History   Social History  . Marital Status: Widowed    Spouse Name: N/A    Number of Children: 2  . Years of Education: N/A   Occupational History  . 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 Smoking status: Never Smoker   . Smokeless tobacco: Never Used  . Alcohol Use: No     Comment: none  . Drug Use: No  . Sexually Active: No    Other Topics Concern  . Not on file   Social History Narrative   Lives with daughter and granddaughter    FAMILY HISTORY:  Family Status  Relation Status Death Age  . Mother Deceased 42    natural causes  . Father Deceased 70    MI  . Sister Alive     DM-2  . Child Alive     2, HTN, DM-2, hyperlipidemia    ROS:  A complete 10 system review of systems was obtained and was unremarkable apart from what is mentioned above.  PHYSICAL EXAMINATION:    VITALS:   Filed Vitals:   09/15/12 1253  BP: 110/72  Pulse: 76  Temp: 98 F (36.7 C)  Resp: 18  Weight: 146 lb (66.225 kg)     GEN:  The patient appears stated age and is in NAD. HEENT:  Normocephalic, atraumatic.  The mucous membranes are moist. The superficial temporal arteries are without ropiness or tenderness. CV:  RRR Lungs:  CTAB Neck/HEME:  There are no carotid bruits bilaterally.  Neurological examination:  Orientation: The patient was alert and oriented.  MocA 04/11/2012 was 25/30. Cranial nerves: There is L facial droop with decreased forehead wrinkle on the L.  She has a positive stellwags sign. Pupils are equal round and reactive to light bilaterally. Fundoscopic exam reveals clear margins bilaterally. Extraocular muscles are intact but there is some upgaze paresis.   There are square wave jerks and difficulty with smooth pursuit.  The visual fields are full to confrontational testing. The speech is hypophonic but fluent and clear. She has no trouble repeating gutteral sounds.  Soft palate rises symmetrically and there is no tongue deviation. Hearing is intact to conversational tone. Sensation: Sensation is intact to light and pinprick throughout (facial, trunk, extremities). Vibration is intact at the bilateral big toe. There is no extinction with double simultaneous stimulation. There is no sensory dermatomal level identified. Motor: Strength is 5/5 in the bilateral upper and lower extremities.   Shoulder shrug  is equal and symmetric.  There is no pronator drift. Deep tendon reflexes: Deep tendon reflexes are 3/4 at the bilateral biceps, triceps, brachioradialis, and achilles. There are cross adductor reflexes at the knees bilaterally.  Plantar responses are upgoing bilaterally.  Movement examination: Tone: There is mild increased tone in the right upper extremity but normal on the L..  The tone in the lower extremities is normal.  Abnormal movements: Rare tremor with activation on the L that was seen previously is not present today. Coordination:  There is decremation with RAM's, including heel taps bilaterally, finger taps and toe taps, most prominent on the L. Gait and Station: The patient was actually very stable today.  Turns were good.  Arm swing was decreased.  LABS:  Lab Results  Component Value Date   WBC 10.4 06/21/2012   HGB 12.4 06/21/2012   HCT 37.9 06/21/2012   MCV 94.3 06/21/2012   PLT 203 06/21/2012     Chemistry      Component Value Date/Time   NA 140 06/21/2012 1944   K 4.5 06/21/2012 1944   CL 103 06/21/2012 1944   CO2 27 06/21/2012 1944   BUN 19 06/21/2012 1944   CREATININE 0.59 06/21/2012 1944      Component Value Date/Time   CALCIUM 10.2 06/21/2012 1944   ALKPHOS 58 06/21/2012 1944   AST 17 06/21/2012 1944   ALT <5 06/21/2012 1944   BILITOT 0.3 06/21/2012 1944     Lab Results  Component Value Date   TSH 0.481 03/27/2012   Lab Results  Component Value Date   VITAMINB12 1297* 03/27/2012  ASSESSMENT/PLAN: 1.  Parkinsonism.   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  Her primary issue is gait instability with slowness of movement.  While she does have some memory loss, this is not a prominent feature.  I believe that FTD and LBD are lower on the list of Ddx, given the relative lack of personality changes per family.  The hallucinations were likely medication induced.  -she will remain on carbidopa/levodopa 25/100, 3 tablets 3 times per day.   When this medication was attempted to be weaned in the past, walking got worse.  Risks, benefits, side effects and alternative therapies were discussed.  The opportunity to ask questions was given and they were answered to the best of my ability.  The patient expressed understanding and willingness to follow the outlined treatment protocols.  - I again spent greater than 50% of the 45 min visit in counseling with the patient and her daughter.  I talked about the importance of maintaining routine and maintaining day/night schedule without reversal.  I would like to see her involved with some type of social activity, perhaps volunteering.  I would like to see her exercise.  She recently got an exercise bike and is planning to use that. 2.  Hx of seizure.  This is likely secondary to the initiation of wellbutrin.  She should remain off of this medication.  EEG in the hospital was normal.   3.  Depression.  I agree with the addition of remeron which has helped sleep, appetite and anxiety.    -she is agreeable, although somewhat reluctant, to going back and seeing Dr. Dellia Cloud.  I really do not think that she has dementia.  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  She needs to work through issues associated with her granddaughter. 3. I will see her back in the next 5 months, sooner should new neurologic issues arise.

## 2012-09-26 ENCOUNTER — Ambulatory Visit: Payer: Medicare Other | Admitting: Psychology

## 2012-10-05 ENCOUNTER — Ambulatory Visit: Payer: Medicare Other | Admitting: Psychology

## 2012-10-11 ENCOUNTER — Ambulatory Visit: Payer: Self-pay | Admitting: Family Medicine

## 2012-10-12 ENCOUNTER — Ambulatory Visit (INDEPENDENT_AMBULATORY_CARE_PROVIDER_SITE_OTHER): Payer: Medicare Other | Admitting: Family Medicine

## 2012-10-12 ENCOUNTER — Other Ambulatory Visit: Payer: Self-pay | Admitting: Family Medicine

## 2012-10-12 ENCOUNTER — Encounter: Payer: Self-pay | Admitting: Family Medicine

## 2012-10-12 VITALS — BP 110/78 | HR 80 | Temp 98.0°F | Wt 149.0 lb

## 2012-10-12 DIAGNOSIS — N390 Urinary tract infection, site not specified: Secondary | ICD-10-CM

## 2012-10-12 LAB — POCT URINALYSIS DIPSTICK
Bilirubin, UA: NEGATIVE
Blood, UA: NEGATIVE
Glucose, UA: NEGATIVE
Ketones, UA: NEGATIVE
Nitrite, UA: NEGATIVE
Spec Grav, UA: 1.015
Urobilinogen, UA: NEGATIVE
pH, UA: 6.5

## 2012-10-12 MED ORDER — CIPROFLOXACIN HCL 250 MG PO TABS: 250.0000 mg | ORAL_TABLET | Freq: Two times a day (BID) | ORAL | Status: DC

## 2012-10-12 NOTE — Patient Instructions (Addendum)
Good to see you. Please take cipro as directed- 1 tablet twice daily x 7 days. We will call you in a couple of days.

## 2012-10-12 NOTE — Addendum Note (Signed)
Addended by: Eliezer Bottom on: 10/12/2012 10:05 AM   Modules accepted: Orders

## 2012-10-12 NOTE — Progress Notes (Signed)
Subjective:    Patient ID: [name], female    DOB: 1946/03/13, 67 y.o.   MRN: [id number]  HPI  67 yo here for left lower quadrant pain and burning x 2 weeks with strong smell to her urine. No dysuria.  Does have h/o recurrent UTIs.  No fever, chills, nausea or vomiting.   UA positive Mod LE and protein, otherwise unremarkable.  Patient Active Problem List  Diagnosis  . AODM  . HYPERLIPIDEMIA, MIXED  . Dysmetabolic syndrome X  . OBESITY, MORBID  . ANXIETY DEPRESSION  . GRIEF REACTION  . DISORDER, DEPRESSIVE NEC  . HYPERTENSION, BENIGN ESSENTIAL  . BREAST MASS, LEFT  . ARM PAIN, RIGHT  . OSTEOPENIA  . Dizziness and giddiness  . FATIGUE  . ALLERGY  . Hypersomnia  . Headache  . Loss of perception for taste  . Loss, sense of, smell  . UTI (lower urinary tract infection)  . Routine general medical examination at a health care facility  . Syncope  . Dysphagia  . Bell's palsy  . 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 Orthostasis  . Dysuria  . Hallucinations  . Seizures  . Depression  . Secondary parkinsonism  . Seizure  . Nasal bone fracture  . Fall   Past Medical History  Diagnosis Date  . Dysmetabolic syndrome X   . Personal history of thrombophlebitis   . Essential hypertension, benign   . Morbid obesity   . Mixed hyperlipidemia   . Depressive disorder, not elsewhere classified   . Bell's palsy     1985  . Adjustment disorder with depressed mood   . DVT (deep venous thrombosis), left "early 2000's"    RLE  . Syncope and collapse 12/06/11    "this am; I pass out fairly often"  . Type II or unspecified type diabetes mellitus without mention of complication, not stated as uncontrolled 12/06/11    "not anymore; had lap band OR"  . Neurodegenerative gait disorder   . Headache 05/2011    "pretty often since they put me on Parkinson's medicine"  . Parkinson disease     "they think that's what it is"  . DEMENTIA   . Disturbances of sensation of  smell and taste     "can't do either"  . Anxiety   . History of shingles     "as a teen and in my 47's"   Past Surgical History  Procedure Laterality Date  . Tonsillectomy and adenoidectomy  1970  . Bladder tack  11/1995  . Septoplasty  08/1998    with antral window   . Knee arthroscopy  12/12/2002    right   . Fracture surgery  02/09/2006    bilateral elbows  . Fracture surgery      right knee  . Fracture surgery      tib plateau  . Breast cyst aspiration  12/1998    bilaterally  . Breast biopsy  05/20/2000    bilaterally  . Tubal ligation  1970's  . Cataract extraction w/ intraocular lens  implant, bilateral    . Laparoscopic gastric banding  2008  . Cardiac catheterization  2004   History  Substance Use Topics  . Smoking status: Never Smoker   . Smokeless tobacco: Never Used  . Alcohol Use: No     Comment: none   Family History  Problem Relation Age of Onset  . COPD Father   . Diabetes Sister   . Vision loss Sister  legally blind  . Asthma Mother   . Heart disease Father    Allergies  Allergen Reactions  . Codeine Sulfate     REACTION: hallucinations  . Penicillins Anaphylaxis, Swelling and Rash  . Wellbutrin (Bupropion) Other (See Comments)    Seizures and hallucinations   Current Outpatient Prescriptions on File Prior to Visit  Medication Sig Dispense Refill  . acetaminophen (TYLENOL) 325 MG tablet Take 2 tablets (650 mg total) by mouth every 6 (six) hours as needed for pain or fever (or Fever >/= 101).  30 tablet  0  . ALPRAZolam (XANAX) 0.5 MG tablet TAKE 1 TABLET TWICE A DAY  60 tablet  0  . aspirin 81 MG tablet Take 81 mg by mouth daily.       . carbidopa-levodopa (SINEMET IR) 25-100 MG per tablet Take 3 tablets by mouth 3 (three) times daily.  270 tablet  5  . cetirizine (ZYRTEC) 10 MG tablet Take 10 mg by mouth daily.      . Cholecalciferol (VITAMIN D3) 1000 UNITS CAPS Take 1 capsule by mouth daily.        . feeding supplement (ENSURE  COMPLETE) LIQD Take 237 mLs by mouth 2 (two) times daily between meals.  1 Bottle  11  . FLUoxetine (PROZAC) 20 MG capsule Take 1 capsule (20 mg total) by mouth daily.  90 capsule  0  . HYDROcodone-acetaminophen (NORCO/VICODIN) 5-325 MG per tablet Take 2 tablets by mouth every 4 (four) hours as needed for pain.  10 tablet  0  . hydroxypropyl methylcellulose (ISOPTO TEARS) 2.5 % ophthalmic solution Place 1 drop into both eyes daily.      Marland Kitchen losartan-hydrochlorothiazide (HYZAAR) 50-12.5 MG per tablet Take one by mouth daily  90 tablet  1  . mirtazapine (REMERON) 30 MG tablet Take 1 tablet (30 mg total) by mouth at bedtime.  30 tablet  3  . Multiple Vitamin (MULTIVITAMIN WITH MINERALS) TABS Take 1 tablet by mouth daily.      . simvastatin (ZOCOR) 80 MG tablet Take 1 tablet (80 mg total) by mouth at bedtime.  90 tablet  1  . vitamin B-12 (CYANOCOBALAMIN) 1000 MCG tablet Take 1,000 mcg by mouth daily.         No current facility-administered medications on file prior to visit.    The PMH, PSH, Social History, Family History, Medications, and allergies have been reviewed in [facility], and have been updated if relevant.     Objective:   Physical Exam BP 110/78  Pulse 80  Temp(Src) 98 F (36.7 C)  Wt 149 lb (67.586 kg)  BMI 28.17 kg/m2  General: Well-developed,well-nourished,in no acute distress; alert,appropriate and cooperative throughout examination, minimally overweight.  Mouth: Oral mucosa and oropharynx without lesions or exudates. Teeth in good repair. Left facial paralysis unchanged.  Neck: No deformities, masses, or tenderness noted.  No bruits  Lungs: Normal respiratory effort, chest expands symmetrically. Lungs are clear to auscultation, no crackles or wheezes.  Heart: Normal rate and regular rhythm. S1 and S2 normal without gallop, murmur, click, rub or other extra sounds.  Abdomen: Bowel sounds positive,abdomen soft and non-tender without masses, organomegaly or hernias noted. NO CVA or  suprapubic tenderness.  Extremities: No clubbing, cyanosis, edema, or deformity noted with normal full range of motion of all joints.  Neurologic: No cranial nerve deficits noted. Station and gait are normal. Sensory, motor and coordinative functions appear intact.  Psych: Cognition and judgment appear intact. Alert and cooperative with normal attention span  and concentration. No apparent delusions, illusions, hallucinations     Assessment & Plan:   UTI (lower urinary tract infection) New- given symptoms and pos LE on UA, will treat with cipro 250 mg twice daily x 7 days and send urine for cx. The patient indicates understanding of these issues and agrees with the plan.

## 2012-10-13 NOTE — Telephone Encounter (Signed)
Medicine called [facility]. 

## 2012-10-15 LAB — URINE CULTURE: Colony Count: >=100000

## 2012-10-19 ENCOUNTER — Ambulatory Visit: Payer: Medicare Other | Admitting: Psychology

## 2012-11-01 ENCOUNTER — Other Ambulatory Visit: Payer: Self-pay

## 2012-11-01 MED ORDER — MIRTAZAPINE 30 MG PO TABS: 30.0000 mg | ORAL_TABLET | Freq: Every day | ORAL | Status: DC

## 2012-11-01 NOTE — Telephone Encounter (Signed)
Spoke with pt she hasn't f/u with him, but I stressed the importance of her seeing him as part of her treatment plan.  She said she will get with her daughter to get f/u scheduled.

## 2012-11-01 NOTE — Telephone Encounter (Signed)
Yes you may refill the remeron with 4 refills, 30mg .  Also, please make sure that the pt followed back up with Dr. Dellia Cloud.  If she hasn't please call her and reiterate the importance of this as part of her treatment plan.

## 2012-11-07 ENCOUNTER — Other Ambulatory Visit: Payer: Self-pay | Admitting: Family Medicine

## 2012-11-07 DIAGNOSIS — E782 Mixed hyperlipidemia: Secondary | ICD-10-CM

## 2012-11-07 DIAGNOSIS — R5381 Other malaise: Secondary | ICD-10-CM

## 2012-11-07 DIAGNOSIS — Z0000 Encounter for general adult medical examination without abnormal findings: Secondary | ICD-10-CM

## 2012-11-14 ENCOUNTER — Other Ambulatory Visit: Payer: Self-pay | Admitting: Family Medicine

## 2012-11-15 NOTE — Telephone Encounter (Signed)
Alprazolam called to [facility] 

## 2012-11-16 ENCOUNTER — Other Ambulatory Visit (INDEPENDENT_AMBULATORY_CARE_PROVIDER_SITE_OTHER): Payer: Medicare Other

## 2012-11-16 DIAGNOSIS — R5381 Other malaise: Secondary | ICD-10-CM

## 2012-11-16 DIAGNOSIS — E782 Mixed hyperlipidemia: Secondary | ICD-10-CM

## 2012-11-16 DIAGNOSIS — R5383 Other fatigue: Secondary | ICD-10-CM

## 2012-11-16 DIAGNOSIS — Z0000 Encounter for general adult medical examination without abnormal findings: Secondary | ICD-10-CM

## 2012-11-16 DIAGNOSIS — I10 Essential (primary) hypertension: Secondary | ICD-10-CM

## 2012-11-16 DIAGNOSIS — E119 Type 2 diabetes mellitus without complications: Secondary | ICD-10-CM

## 2012-11-16 LAB — CBC WITH DIFFERENTIAL/PLATELET
Basophils Absolute: 0 10*3/uL (ref 0.0–0.1)
Basophils Relative: 0.6 % (ref 0.0–3.0)
Eosinophils Absolute: 0.2 10*3/uL (ref 0.0–0.7)
Eosinophils Relative: 2.6 % (ref 0.0–5.0)
HCT: 38.9 % (ref 36.0–46.0)
Hemoglobin: 13.1 g/dL (ref 12.0–15.0)
Lymphocytes Relative: 36.3 % (ref 12.0–46.0)
Lymphs Abs: 2.5 10*3/uL (ref 0.7–4.0)
MCHC: 33.5 g/dL (ref 30.0–36.0)
MCV: 95.9 fl (ref 78.0–100.0)
Monocytes Absolute: 0.5 10*3/uL (ref 0.1–1.0)
Monocytes Relative: 7.8 % (ref 3.0–12.0)
Neutro Abs: 3.6 10*3/uL (ref 1.4–7.7)
Neutrophils Relative %: 52.7 % (ref 43.0–77.0)
Platelets: 207 10*3/uL (ref 150.0–400.0)
RBC: 4.06 Mil/uL (ref 3.87–5.11)
RDW: 13.9 % (ref 11.5–14.6)
WBC: 6.9 10*3/uL (ref 4.5–10.5)

## 2012-11-16 LAB — COMPREHENSIVE METABOLIC PANEL
ALT: 10 U/L (ref 0–35)
AST: 16 U/L (ref 0–37)
Albumin: 3.8 g/dL (ref 3.5–5.2)
Alkaline Phosphatase: 47 U/L (ref 39–117)
BUN: 20 mg/dL (ref 6–23)
CO2: 32 mEq/L (ref 19–32)
Calcium: 9.8 mg/dL (ref 8.4–10.5)
Chloride: 104 mEq/L (ref 96–112)
Creatinine, Ser: 0.7 mg/dL (ref 0.4–1.2)
GFR: 94.69 mL/min (ref >60.00)
Glucose, Bld: 91 mg/dL (ref 70–99)
Potassium: 4.2 mEq/L (ref 3.5–5.1)
Sodium: 141 mEq/L (ref 135–145)
Total Bilirubin: 0.5 mg/dL (ref 0.3–1.2)
Total Protein: 6.3 g/dL (ref 6.0–8.3)

## 2012-11-16 LAB — LIPID PANEL
Cholesterol: 145 mg/dL (ref 0–200)
HDL: 62.2 mg/dL (ref >39.00)
LDL Cholesterol: 50 mg/dL (ref 0–99)
Total CHOL/HDL Ratio: 2
Triglycerides: 166 mg/dL — ABNORMAL HIGH (ref 0.0–149.0)
VLDL: 33.2 mg/dL (ref 0.0–40.0)

## 2012-11-16 LAB — VITAMIN B12: Vitamin B-12: 1387 pg/mL — ABNORMAL HIGH (ref 211–911)

## 2012-11-23 ENCOUNTER — Encounter: Payer: Self-pay | Admitting: Family Medicine

## 2012-11-23 ENCOUNTER — Ambulatory Visit (INDEPENDENT_AMBULATORY_CARE_PROVIDER_SITE_OTHER): Payer: Medicare Other | Admitting: Family Medicine

## 2012-11-23 VITALS — BP 122/80 | HR 88 | Temp 98.3°F | Ht 62.5 in | Wt 150.0 lb

## 2012-11-23 DIAGNOSIS — Z1211 Encounter for screening for malignant neoplasm of colon: Secondary | ICD-10-CM

## 2012-11-23 DIAGNOSIS — R1310 Dysphagia, unspecified: Secondary | ICD-10-CM

## 2012-11-23 DIAGNOSIS — R300 Dysuria: Secondary | ICD-10-CM

## 2012-11-23 DIAGNOSIS — I10 Essential (primary) hypertension: Secondary | ICD-10-CM

## 2012-11-23 DIAGNOSIS — R569 Unspecified convulsions: Secondary | ICD-10-CM

## 2012-11-23 DIAGNOSIS — E782 Mixed hyperlipidemia: Secondary | ICD-10-CM

## 2012-11-23 DIAGNOSIS — G3183 Dementia with Lewy bodies: Secondary | ICD-10-CM

## 2012-11-23 DIAGNOSIS — E119 Type 2 diabetes mellitus without complications: Secondary | ICD-10-CM

## 2012-11-23 DIAGNOSIS — Z66 Do not resuscitate: Secondary | ICD-10-CM | POA: Insufficient documentation

## 2012-11-23 DIAGNOSIS — F0280 Dementia in other diseases classified elsewhere without behavioral disturbance: Secondary | ICD-10-CM

## 2012-11-23 DIAGNOSIS — Z0000 Encounter for general adult medical examination without abnormal findings: Secondary | ICD-10-CM

## 2012-11-23 DIAGNOSIS — F341 Dysthymic disorder: Secondary | ICD-10-CM

## 2012-11-23 LAB — POCT URINALYSIS DIPSTICK
Bilirubin, UA: NEGATIVE
Glucose, UA: NEGATIVE
Ketones, UA: NEGATIVE
Nitrite, UA: NEGATIVE
Spec Grav, UA: 1.015
Urobilinogen, UA: NEGATIVE
pH, UA: 7

## 2012-11-23 LAB — HEMOGLOBIN A1C: Hgb A1c MFr Bld: 5.8 % (ref 4.6–6.5)

## 2012-11-23 MED ORDER — CIPROFLOXACIN HCL 250 MG PO TABS: 250.0000 mg | ORAL_TABLET | Freq: Two times a day (BID) | ORAL | Status: AC

## 2012-11-23 NOTE — Patient Instructions (Addendum)
Let's try Prilosec - 1 tablet once a day for 2 weeks. Call me with an update.  Please stop by the lab to get your stool cards.  Please take cipro as directed - 1 tablet twice daily x 7 days. We will call you with your culture.

## 2012-11-23 NOTE — Progress Notes (Addendum)
Subjective:    Patient ID: [name], female    DOB: 10-25-1945, 67 y.o.   MRN: [id number]  HPI  67 yo pleasant female here with her daughter for AMW visit.  Daughter has noticed odor in urine. H/o recurrent UTIs, usually asymptomatic other than foul odor in urine. Having some left side pain.  No dysuria.  No blood in stool or urine.   I have personally reviewed the Medicare Annual Wellness questionnaire and have noted 1. The patient's medical and social history 2. Their use of alcohol, tobacco or illicit drugs 3. Their current medications and supplements 4. The patient's functional ability including ADL's, fall risks, home safety risks and hearing or visual             impairment. 5. Diet and physical activities 6. Evidence for depression or mood disorders  End of life wishes discussed and updated in Social History.  PSP- followed by Dr. Arbutus Leas.  Last note reviewed.  Advised continuing current meds.    Depression- On Remeron.  ? Dr. Dellia Cloud.  Lab Results  Component Value Date   CHOL 145 11/16/2012   HDL 62.20 11/16/2012   LDLCALC 50 11/16/2012   TRIG 166.0* 11/16/2012   CHOLHDL 2 11/16/2012   Lab Results  Component Value Date   CREATININE 0.7 11/16/2012     Patient Active Problem List   Diagnosis Date Noted  . 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 Seizures 04/03/2012  . Dysphagia 12/07/2011  . Orthostasis 12/07/2011  . Bell's palsy   . 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 Routine general medical examination at a health care facility 09/21/2011  . Loss of perception for taste 04/07/2011  . Loss, sense of, smell 04/07/2011  . Hypersomnia 01/18/2011  . OSTEOPENIA 05/21/2010  . ANXIETY DEPRESSION 04/16/2010  . OBESITY, MORBID 02/16/2007  . AODM 01/31/2007  . HYPERLIPIDEMIA, MIXED 01/31/2007  . HYPERTENSION, BENIGN ESSENTIAL 01/31/2007   Past Medical History  Diagnosis Date  . Dysmetabolic syndrome X   . Personal history of thrombophlebitis   .  Essential hypertension, benign   . Morbid obesity   . Mixed hyperlipidemia   . Depressive disorder, not elsewhere classified   . Bell's palsy     1985  . Adjustment disorder with depressed mood   . DVT (deep venous thrombosis), left "early 2000's"    RLE  . Syncope and collapse 12/06/11    "this am; I pass out fairly often"  . Type II or unspecified type diabetes mellitus without mention of complication, not stated as uncontrolled 12/06/11    "not anymore; had lap band OR"  . Neurodegenerative gait disorder   . Headache 05/2011    "pretty often since they put me on Parkinson's medicine"  . Parkinson disease     "they think that's what it is"  . DEMENTIA   . 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 Anxiety   . History of shingles     "as a teen and in my 10's"   Past Surgical History  Procedure Laterality Date  . Tonsillectomy and adenoidectomy  1970  . Bladder tack  11/1995  . Septoplasty  08/1998    with antral window   . Knee arthroscopy  12/12/2002    right   . Fracture surgery  02/09/2006    bilateral elbows  . Fracture surgery      right knee  . Fracture surgery  tib plateau  . Breast cyst aspiration  12/1998    bilaterally  . Breast biopsy  05/20/2000    bilaterally  . Tubal ligation  1970's  . Cataract extraction w/ intraocular lens  implant, bilateral    . Laparoscopic gastric banding  2008  . Cardiac catheterization  2004   History  Substance Use Topics  . Smoking status: Never Smoker   . Smokeless tobacco: Never Used  . Alcohol Use: No     Comment: none   Family History  Problem Relation Age of Onset  . COPD Father   . Diabetes Sister   . Vision loss Sister     legally blind  . Asthma Mother   . Heart disease Father    Allergies  Allergen Reactions  . Codeine Sulfate     REACTION: hallucinations  . Penicillins Anaphylaxis, Swelling and Rash  . Wellbutrin (Bupropion) Other (See Comments)    Seizures and  hallucinations   Current Outpatient Prescriptions on File Prior to Visit  Medication Sig Dispense Refill  . acetaminophen (TYLENOL) 325 MG tablet Take 2 tablets (650 mg total) by mouth every 6 (six) hours as needed for pain or fever (or Fever >/= 101).  30 tablet  0  . ALPRAZolam (XANAX) 0.5 MG tablet TAKE 1 TABLET TWICE A DAY  60 tablet  0  . aspirin 81 MG tablet Take 81 mg by mouth daily.       . carbidopa-levodopa (SINEMET IR) 25-100 MG per tablet Take 3 tablets by mouth 3 (three) times daily.  270 tablet  5  . cetirizine (ZYRTEC) 10 MG tablet Take 10 mg by mouth daily.      . Cholecalciferol (VITAMIN D3) 1000 UNITS CAPS Take 1 capsule by mouth daily.        . feeding supplement (ENSURE COMPLETE) LIQD Take 237 mLs by mouth 2 (two) times daily between meals.  1 Bottle  11  . FLUoxetine (PROZAC) 20 MG capsule TAKE 1 CAPSULE (20 MG TOTAL) BY MOUTH DAILY.  90 capsule  0  . HYDROcodone-acetaminophen (NORCO/VICODIN) 5-325 MG per tablet Take 2 tablets by mouth every 4 (four) hours as needed for pain.  10 tablet  0  . hydroxypropyl methylcellulose (ISOPTO TEARS) 2.5 % ophthalmic solution Place 1 drop into both eyes daily.      Marland Kitchen losartan-hydrochlorothiazide (HYZAAR) 50-12.5 MG per tablet Take one by mouth daily  90 tablet  1  . mirtazapine (REMERON) 30 MG tablet Take 1 tablet (30 mg total) by mouth at bedtime.  30 tablet  4  . Multiple Vitamin (MULTIVITAMIN WITH MINERALS) TABS Take 1 tablet by mouth daily.      . Ranitidine HCl ([facility] RANITIDINE PO) Take one by mouth daily, patient unsure of strength      . simvastatin (ZOCOR) 80 MG tablet Take 1 tablet (80 mg total) by mouth at bedtime.  90 tablet  1  . vitamin B-12 (CYANOCOBALAMIN) 1000 MCG tablet Take 1,000 mcg by mouth daily.         No current facility-administered medications on file prior to visit.   The PMH, PSH, Social History, Family History, Medications, and allergies have been reviewed in [facility], and have been updated if  relevant.    Review of Systems See HPI    Patient reports no  vision/ hearing changes,anorexia, weight change, fever ,adenopathy, persistant / recurrent hoarseness, swallowing issues, chest pain, edema,persistant / recurrent cough, hemoptysis, dyspnea(rest, exertional, paroxysmal nocturnal), gastrointestinal  bleeding (melena, rectal bleeding), abdominal  pain, excessive heart burn, GU symptoms(dysuria, hematuria, pyuria, voiding/incontinence  Issues) syncope, focal weakness, severe memory loss, concerning skin lesions, depression, anxiety, abnormal bruising/bleeding, major joint swelling, breast masses or abnormal vaginal bleeding.    Objective:   Physical Exam BP 122/80  Pulse 88  Temp(Src) 98.3 F (36.8 C)  Ht 5' 2.5" (1.588 m)  Wt 150 lb (68.04 kg)  BMI 26.98 kg/m2  General: Well-developed,well-nourished,in no acute distress; alert,appropriate and cooperative throughout examination, minimally overweight.  Mouth: Oral mucosa and oropharynx without lesions or exudates. Teeth in good repair. Left facial paralysis unchanged.  Neck: No deformities, masses, or tenderness noted.  No bruits  Lungs: Normal respiratory effort, chest expands symmetrically. Lungs are clear to auscultation, no crackles or wheezes.  Heart: Normal rate and regular rhythm. S1 and S2 normal without gallop, murmur, click, rub or other extra sounds.  Abdomen: Bowel sounds positive,abdomen soft and non-tender without masses, organomegaly or hernias noted. NO CVA or suprapubic tenderness.  Extremities: No clubbing, cyanosis, edema, or deformity noted with normal full range of motion of all joints.  Neurologic: No cranial nerve deficits noted. Station and gait are normal. Sensory, motor and coordinative functions appear intact.  Psych: Cognition and judgment appear intact. Alert and cooperative with normal attention span and concentration. No apparent delusions, illusions, hallucinations     Assessment & Plan:   1.  AODM Due for a1 c today. - Hemoglobin A1c  2. ANXIETY DEPRESSION Improved with remeron.  3. 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 Arbutus Leas.  On sinimet.  4. HYPERLIPIDEMIA, MIXED Well controlled on Zocor.  5. HYPERTENSION, BENIGN ESSENTIAL Well controlled on Hyzaar.  Kidney function looks good.  No changes.  6. Routine general medical examination at a health care facility The patients weight, height, BMI and visual acuity have been recorded in the chart I have made referrals, counseling and provided education to the patient based review of the above and I have provided the pt with a written personalized care plan for preventive services.   7. Seizures No recent seizures- likely due to Wellbutrin.  8. DNR (do not resuscitate)  Yellow DNR form signed and given to pt and her daughter to keep at her house.  9. Dysphagia Stable.  10. Special screening for malignant neoplasms, colon  - Fecal occult blood, imunochemical; Future  11. Dysuria UA positive- treat with cipro 250 mg twice daily x 7 days, send urine for cx. - POCT urinalysis dipstick

## 2012-11-24 ENCOUNTER — Telehealth: Payer: Self-pay

## 2012-11-24 NOTE — Telephone Encounter (Signed)
Pt left v/m got stool kit 11/23/12; there was not a bottle in stool kit to save specimen. Pt said granddaughter can come this afternoon to pick up another kit. Pt request call back.

## 2012-11-25 LAB — URINE CULTURE
Colony Count: NO GROWTH
Organism ID, Bacteria: NO GROWTH

## 2012-11-27 ENCOUNTER — Other Ambulatory Visit: Payer: Self-pay | Admitting: Family Medicine

## 2012-11-27 DIAGNOSIS — R1030 Lower abdominal pain, unspecified: Secondary | ICD-10-CM

## 2012-11-27 DIAGNOSIS — R319 Hematuria, unspecified: Secondary | ICD-10-CM

## 2012-11-29 ENCOUNTER — Other Ambulatory Visit: Payer: Self-pay

## 2012-11-29 DIAGNOSIS — Z1231 Encounter for screening mammogram for malignant neoplasm of breast: Secondary | ICD-10-CM

## 2012-12-01 ENCOUNTER — Encounter: Payer: Self-pay | Admitting: Family Medicine

## 2012-12-01 ENCOUNTER — Encounter: Payer: Self-pay | Admitting: *Deleted

## 2012-12-01 ENCOUNTER — Other Ambulatory Visit (INDEPENDENT_AMBULATORY_CARE_PROVIDER_SITE_OTHER): Payer: Medicare Other

## 2012-12-01 DIAGNOSIS — Z1211 Encounter for screening for malignant neoplasm of colon: Secondary | ICD-10-CM

## 2012-12-01 LAB — FECAL OCCULT BLOOD, IMMUNOCHEMICAL: Fecal Occult Bld: NEGATIVE

## 2012-12-01 LAB — FECAL OCCULT BLOOD, GUAIAC: Fecal Occult Blood: NEGATIVE

## 2012-12-15 ENCOUNTER — Other Ambulatory Visit: Payer: Self-pay | Admitting: Family Medicine

## 2012-12-19 NOTE — Telephone Encounter (Signed)
Called to pharmacy 

## 2012-12-25 ENCOUNTER — Other Ambulatory Visit: Payer: Self-pay | Admitting: Family Medicine

## 2013-01-13 ENCOUNTER — Other Ambulatory Visit: Payer: Self-pay | Admitting: Family Medicine

## 2013-01-15 NOTE — Telephone Encounter (Signed)
Medicine called [facility]. 

## 2013-01-22 ENCOUNTER — Ambulatory Visit
Admission: RE | Admit: 2013-01-22 | Discharge: 2013-01-22 | Disposition: A | Discharge status: Home / Self Care | Payer: Medicare Other | Source: Ambulatory Visit

## 2013-01-22 DIAGNOSIS — Z1231 Encounter for screening mammogram for malignant neoplasm of breast: Secondary | ICD-10-CM

## 2013-01-24 ENCOUNTER — Other Ambulatory Visit: Payer: Self-pay | Admitting: Family Medicine

## 2013-02-14 ENCOUNTER — Encounter: Payer: Self-pay | Admitting: Family Medicine

## 2013-02-14 ENCOUNTER — Other Ambulatory Visit: Payer: Self-pay | Admitting: Family Medicine

## 2013-02-14 ENCOUNTER — Ambulatory Visit (INDEPENDENT_AMBULATORY_CARE_PROVIDER_SITE_OTHER): Payer: Medicare Other | Admitting: Family Medicine

## 2013-02-14 VITALS — BP 120/78 | HR 86 | Temp 97.9°F | Ht 62.5 in | Wt 151.0 lb

## 2013-02-14 DIAGNOSIS — R300 Dysuria: Secondary | ICD-10-CM

## 2013-02-14 LAB — POCT URINALYSIS DIPSTICK
Bilirubin, UA: NEGATIVE
Blood, UA: NEGATIVE
Glucose, UA: NEGATIVE
Ketones, UA: NEGATIVE
Nitrite, UA: NEGATIVE
Protein, UA: NEGATIVE
Spec Grav, UA: 1.025
Urobilinogen, UA: NEGATIVE
pH, UA: 6

## 2013-02-14 MED ORDER — CIPROFLOXACIN HCL 250 MG PO TABS: 250.0000 mg | ORAL_TABLET | Freq: Two times a day (BID) | ORAL | Status: DC

## 2013-02-14 NOTE — Progress Notes (Signed)
Nature conservation officer at [facility] [address] [place] [address] Phone: [phone] Fax: [phone]  Date:  02/14/2013   Name:  [name]   DOB:  October 10, 1945   MRN:  [id number] Gender: female Age: 67 y.o.  Primary Physician:  [name], MD  Evaluating MD: Hannah Beat, MD   Chief Complaint: Urinary Tract Infection   History of Present Illness:  [name] is a 67 y.o. pleasant patient who presents with the following:  Does not have sense of smell Wonders if has UTI.  Ate supper last night. Freezes a lot.   History of multiple UTI. Decreased sensation. Overall, does not feel all that well. She was at the beach last week. She has had some postnasal drainage.  Patient Active Problem List   Diagnosis Date Noted  . DNR (do not resuscitate) 11/23/2012  . 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 Seizures 04/03/2012  . Dysphagia 12/07/2011  . Orthostasis 12/07/2011  . Bell's palsy   . 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 Routine general medical examination at a health care facility 09/21/2011  . Loss of perception for taste 04/07/2011  . Loss, sense of, smell 04/07/2011  . Hypersomnia 01/18/2011  . OSTEOPENIA 05/21/2010  . ANXIETY DEPRESSION 04/16/2010  . OBESITY, MORBID 02/16/2007  . AODM 01/31/2007  . HYPERLIPIDEMIA, MIXED 01/31/2007  . HYPERTENSION, BENIGN ESSENTIAL 01/31/2007    Past Medical History  Diagnosis Date  . Dysmetabolic syndrome X   . Personal history of thrombophlebitis   . Essential hypertension, benign   . Morbid obesity   . Mixed hyperlipidemia   . Depressive disorder, not elsewhere classified   . Bell's palsy     1985  . Adjustment disorder with depressed mood   . DVT (deep venous thrombosis), left "early 2000's"    RLE  . Syncope and collapse 12/06/11    "this am; I pass out fairly often"  . Type II or unspecified type diabetes mellitus without mention of complication, not stated as uncontrolled 12/06/11   "not anymore; had lap band OR"  . Neurodegenerative gait disorder   . LKGMWNUU(725.3) 05/2011    "pretty often since they put me on Parkinson's medicine"  . Parkinson disease     "they think that's what it is"  . DEMENTIA   . 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 Anxiety   . History of shingles     "as a teen and in my 24's"    Past Surgical History  Procedure Laterality Date  . Tonsillectomy and adenoidectomy  1970  . Bladder tack  11/1995  . Septoplasty  08/1998    with antral window   . Knee arthroscopy  12/12/2002    right   . Fracture surgery  02/09/2006    bilateral elbows  . Fracture surgery      right knee  . Fracture surgery      tib plateau  . Breast cyst aspiration  12/1998    bilaterally  . Breast biopsy  05/20/2000    bilaterally  . Tubal ligation  1970's  . Cataract extraction w/ intraocular lens  implant, bilateral    . Laparoscopic gastric banding  2008  . Cardiac catheterization  2004    History   Social History  . Marital Status: Widowed    Spouse Name: N/A    Number of Children: 2  . Years of Education: N/A   Occupational History  .  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 Smoking status: Never Smoker   . Smokeless tobacco: Never Used  . Alcohol Use: No     Comment: none  . Drug Use: No  . Sexually Active: No   Other Topics Concern  . Not on file   Social History Narrative   Lives with daughter and granddaughter.   Daughter is HPOA.   DNR.    Family History  Problem Relation Age of Onset  . COPD Father   . Diabetes Sister   . Vision loss Sister     legally blind  . Asthma Mother   . Heart disease Father     Allergies  Allergen Reactions  . Codeine Sulfate     REACTION: hallucinations  . Penicillins Anaphylaxis, Swelling and Rash  . Wellbutrin (Bupropion) Other (See Comments)    Seizures and hallucinations    Medication list has been reviewed  and updated.  Outpatient Prescriptions Prior to Visit  Medication Sig Dispense Refill  . acetaminophen (TYLENOL) 325 MG tablet Take 2 tablets (650 mg total) by mouth every 6 (six) hours as needed for pain or fever (or Fever >/= 101).  30 tablet  0  . ALPRAZolam (XANAX) 0.5 MG tablet TAKE 1 TABLET TWICE A DAY  60 tablet  0  . aspirin 81 MG tablet Take 81 mg by mouth daily.       . carbidopa-levodopa (SINEMET IR) 25-100 MG per tablet Take 3 tablets by mouth 3 (three) times daily.  270 tablet  5  . cetirizine (ZYRTEC) 10 MG tablet Take 10 mg by mouth daily.      . Cholecalciferol (VITAMIN D3) 1000 UNITS CAPS Take 1 capsule by mouth daily.        . feeding supplement (ENSURE COMPLETE) LIQD Take 237 mLs by mouth 2 (two) times daily between meals.  1 Bottle  11  . FLUoxetine (PROZAC) 20 MG capsule TAKE 1 CAPSULE (20 MG TOTAL) BY MOUTH DAILY.  90 capsule  0  . losartan-hydrochlorothiazide (HYZAAR) 50-12.5 MG per tablet TAKE ONE BY MOUTH DAILY  90 tablet  1  . mirtazapine (REMERON) 30 MG tablet Take 1 tablet (30 mg total) by mouth at bedtime.  30 tablet  4  . Ranitidine HCl ([facility] RANITIDINE PO) Take one by mouth daily, patient unsure of strength      . simvastatin (ZOCOR) 80 MG tablet TAKE 1 TABLET (80 MG TOTAL) BY MOUTH AT BEDTIME.  90 tablet  1  . vitamin B-12 (CYANOCOBALAMIN) 1000 MCG tablet Take 1,000 mcg by mouth daily.         No facility-administered medications prior to visit.    Review of Systems:  ROS: GEN: Acute illness details above GI: Tolerating PO intake GU: maintaining adequate hydration and urination Pulm: No SOB Interactive and getting along well at home.  Otherwise, ROS is as per the HPI.   Physical Examination: BP 120/78  Pulse 86  Temp(Src) 97.9 F (36.6 C) (Oral)  Ht 5' 2.5" (1.588 m)  Wt 151 lb (68.493 kg)  BMI 27.16 kg/m2  SpO2 95%  Ideal Body Weight: Weight in (lb) to have BMI = 25: 138.6   GEN: WDWN, A&Ox4,NAD. Non-toxic HEENT: Atraumatc,  normocephalic. CV: RRR, No M/G/R PULM: CTA B, No wheezes, crackles, or rhonchi ABD: S, NT, ND, +BS, no rebound. No CVAT. No suprapubic tenderness. EXT: No c/c/e   Assessment and Plan:  Dysuria - Plan:  POCT Urinalysis Dipstick, Urine culture  Probable uti - LE only, ? - rx for now, culture  Results for orders placed in visit on 02/14/13  POCT URINALYSIS DIPSTICK      Result Value Range   Color, UA yellow     Clarity, UA clear     Glucose, UA neg     Bilirubin, UA neg     Ketones, UA neg     Spec Grav, UA 1.025     Blood, UA neg     pH, UA 6.0     Protein, UA neg     Urobilinogen, UA negative     Nitrite, UA neg     Leukocytes, UA small (1+)       Orders Today:  Orders Placed This Encounter  Procedures  . Urine culture  . POCT Urinalysis Dipstick    Updated Medication List: (Includes new medications, updates to list, dose adjustments) Meds ordered this encounter  Medications  . ciprofloxacin (CIPRO) 250 MG tablet    Sig: Take 1 tablet (250 mg total) by mouth 2 (two) times daily.    Dispense:  14 tablet    Refill:  0    Medications Discontinued: There are no discontinued medications.    Signed, Elpidio Galea. Burhanuddin Kohlmann, MD 02/14/2013 11:01 AM

## 2013-02-15 NOTE — Telephone Encounter (Signed)
Alprazolam called to [facility] 

## 2013-02-16 LAB — URINE CULTURE
Colony Count: NO GROWTH
Organism ID, Bacteria: NO GROWTH

## 2013-03-12 ENCOUNTER — Telehealth: Payer: Self-pay

## 2013-03-12 MED ORDER — CARBIDOPA-LEVODOPA 25-100 MG PO TABS: 3.0000 | ORAL_TABLET | Freq: Three times a day (TID) | ORAL | Status: DC

## 2013-03-12 NOTE — Telephone Encounter (Signed)
Rx sent to pharmacy.  Msg for pt to schedule f/u prior to more refills.

## 2013-03-12 NOTE — Telephone Encounter (Signed)
One month RX with 1 refill and needs f/u appt in that time.

## 2013-03-12 NOTE — Telephone Encounter (Signed)
Faxed refill request for pt's carbi/levo #[phone].  Pt was due for f/u in July.

## 2013-03-14 ENCOUNTER — Other Ambulatory Visit: Payer: Self-pay | Admitting: Family Medicine

## 2013-03-15 NOTE — Telephone Encounter (Signed)
Refill called to [facility]. 

## 2013-03-17 ENCOUNTER — Other Ambulatory Visit: Payer: Self-pay | Admitting: Neurology

## 2013-04-03 ENCOUNTER — Ambulatory Visit (INDEPENDENT_AMBULATORY_CARE_PROVIDER_SITE_OTHER): Payer: Medicare Other | Admitting: Neurology

## 2013-04-03 ENCOUNTER — Ambulatory Visit (INDEPENDENT_AMBULATORY_CARE_PROVIDER_SITE_OTHER): Payer: Medicare Other | Admitting: Family Medicine

## 2013-04-03 VITALS — BP 118/72 | HR 80 | Temp 98.0°F | Resp 20 | Wt 157.0 lb

## 2013-04-03 DIAGNOSIS — G219 Secondary parkinsonism, unspecified: Secondary | ICD-10-CM

## 2013-04-03 DIAGNOSIS — F0280 Dementia in other diseases classified elsewhere without behavioral disturbance: Secondary | ICD-10-CM

## 2013-04-03 DIAGNOSIS — Z23 Encounter for immunization: Secondary | ICD-10-CM

## 2013-04-03 DIAGNOSIS — R51 Headache: Secondary | ICD-10-CM

## 2013-04-03 DIAGNOSIS — F329 Major depressive disorder, single episode, unspecified: Secondary | ICD-10-CM

## 2013-04-03 DIAGNOSIS — G20A1 Parkinson's disease without dyskinesia, without mention of fluctuations: Secondary | ICD-10-CM

## 2013-04-03 DIAGNOSIS — F3289 Other specified depressive episodes: Secondary | ICD-10-CM

## 2013-04-03 DIAGNOSIS — G20 Parkinson's disease: Secondary | ICD-10-CM

## 2013-04-03 LAB — SEDIMENTATION RATE: Sed Rate: 18 mm/hr (ref 0–22)

## 2013-04-03 MED ORDER — METHYLPREDNISOLONE (PAK) 4 MG PO TABS: ORAL_TABLET | ORAL | Status: DC

## 2013-04-03 MED ORDER — RIVASTIGMINE 9.5 MG/24HR TD PT24: 1.0000 | MEDICATED_PATCH | Freq: Every day | TRANSDERMAL | Status: DC

## 2013-04-03 NOTE — Patient Instructions (Addendum)
1.  Start with the samples of the exelon patch - 4.6 mg daily.  Do that for a month and then fill the RX with the voucher for the 9.5 mg patch 2.  We will draw labs today 3.  We will send a new referral for the rehab program for the speech and physical therapy 4.  Monitor your blood sugars closely while on the medrol.  Call if it does not break your headaches  Exelon 4.6mg  patch Samples of this drug were given to the patient, quantity 28, Lot Number [id number]

## 2013-04-03 NOTE — Progress Notes (Signed)
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  This patient is accompanied in the office by her child who supplements the history.      She is on carbidopa/levodopa 25/100, 3 tablets 3 times per day.  She is doing well on this medication, without side effects.  She had one fall the day before Thanksgiving.  She was in a parking lot of a restaurant and tripped over something(curb).  She fx her nose and had to have her lip stitched up.    Since then, however, she has done very well.  Her daughter reports that she has been very stable on her feet.  Last visit, she was complaining of a decreased appetite and increased anxiety.  Therefore, I increased her Remeron to 30 mg at night.  This helped with both sleep and appetite and she is very pleased.  She also has a hx of seizure, likely secondary to Wellbutrin.  Last visit, her keppra was d/c.  She has been seizure free since that time.  However, mood and depression continue to be an issue.  I sent her to Dr. Dellia Cloud but she didn't follow up.  She states that she cannot talk to him without her daughter present.  Her biggest issue is that her granddaughter is a Chart[employer] loss adjuster and she lives with the patient.  Apparently, the majority of the house is clean and safe with the exception of the granddaughters room.  The patient states that the mess just drives her crazy and she cannot stop thinking about it.  Her granddaughter goes to community college and her leaving the house is not an option.  04/03/13 update:  [name].  She was supposed to f/u with Dr Dellia Cloud due to significant anxiety and depression and she cancelled her 3/4, 3/13 and 3/27 visits and never r/s.  She has not seen him since 10/13.  She states that she is just "not going to talk with someone."  She, however, is doing some better in that regard.  Much of her stress surrounds her granddaughter, who lives with  her and is a Chartered loss adjuster.  The patient states that her granddaughter has now admitted to this, but to seek help.  They were able to get some of the room cleaned up a while ago and that seemed to really help the patient's mental state.  However, the room has reconnected and is now just as it previously was.  She also reports a significant amount of stress over her sister who has end-stage Alzheimer's dementia.  Her sister no longer recognizes her, and the patient realizes when she goes around her sister, it makes her symptoms much worse.  She feels that her memory is getting worse, but nothing like her sisters.  She also thinks that her speech is getting worse.    She is having some headaches, similar to what she had in the past, prior to starting on the Levodopa.  It is over the L forehead, but not in the eye.  It is pounding.  It is daily.  It does not cause n/v or cause photophobia or phonophobia.  It makes her feel "drained."  It does not prevent her from doing things.  There are no lateralizing paresthesias with the headaches.  There are no vision changes.  Wearing off:  no  How long before next dose:  n/a Falls:   yes, last in Nov 2013 N/V:  no Hallucinations:  no  visual distortions: no N/V:  no Lightheaded:  Rarely (1-2 times per month)  Syncope: no Dyskinesia:  no  Neuroimaging has previously been performed.   It was done on 04/23/11 and demonstrated significant atrophy especially in the temporal regions and moderate small vessel disease.  Gad was not given.   PREVIOUS MEDICATIONS: Sinemet  ALLERGIES:   Allergies  Allergen Reactions  . Codeine Sulfate     REACTION: hallucinations  . Penicillins Anaphylaxis, Swelling and Rash  . Wellbutrin [Bupropion] Other (See Comments)    Seizures and hallucinations    CURRENT MEDICATIONS:  Current Outpatient Prescriptions on File Prior to Visit  Medication Sig Dispense Refill  . ALPRAZolam (XANAX) 0.5 MG tablet TAKE 1 TABLET BY MOUTH TWICE A DAY   60 tablet  0  . aspirin 81 MG tablet Take 81 mg by mouth daily.       . carbidopa-levodopa (SINEMET IR) 25-100 MG per tablet Take 3 tablets by mouth 3 (three) times daily.  270 tablet  0  . cetirizine (ZYRTEC) 10 MG tablet Take 10 mg by mouth daily.      . Cholecalciferol (VITAMIN D3) 1000 UNITS CAPS Take 1 capsule by mouth daily.        . ciprofloxacin (CIPRO) 250 MG tablet Take 1 tablet (250 mg total) by mouth 2 (two) times daily.  14 tablet  0  . feeding supplement (ENSURE COMPLETE) LIQD Take 237 mLs by mouth 2 (two) times daily between meals.  1 Bottle  11  . FLUoxetine (PROZAC) 20 MG capsule TAKE ONE CAPSULE BY MOUTH EVERY DAY  90 capsule  0  . losartan-hydrochlorothiazide (HYZAAR) 50-12.5 MG per tablet TAKE ONE BY MOUTH DAILY  90 tablet  1  . mirtazapine (REMERON) 30 MG tablet TAKE 1 TABLET BY MOUTH AT BEDTIME  30 tablet  0  . Ranitidine HCl ([facility] RANITIDINE PO) Take one by mouth daily, patient unsure of strength      . simvastatin (ZOCOR) 80 MG tablet TAKE 1 TABLET (80 MG TOTAL) BY MOUTH AT BEDTIME.  90 tablet  1  . vitamin B-12 (CYANOCOBALAMIN) 1000 MCG tablet Take 1,000 mcg by mouth daily.         No current facility-administered medications on file prior to visit.    PAST MEDICAL HISTORY:   Past Medical History  Diagnosis Date  . Dysmetabolic syndrome X   . Personal history of thrombophlebitis   . Essential hypertension, benign   . Morbid obesity   . Mixed hyperlipidemia   . Depressive disorder, not elsewhere classified   . Bell's palsy     1985  . Adjustment disorder with depressed mood   . DVT (deep venous thrombosis), left "early 2000's"    RLE  . Syncope and collapse 12/06/11    "this am; I pass out fairly often"  . Type II or unspecified type diabetes mellitus without mention of complication, not stated as uncontrolled 12/06/11    "not anymore; had lap band OR"  . Neurodegenerative gait disorder   . AVWUJWJX(914.7) 05/2011    "pretty often since they put me on  Parkinson's medicine"  . Parkinson disease     "they think that's what it is"  . DEMENTIA   . 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 Anxiety   . History of shingles     "as a teen and in my 12's"    PAST SURGICAL HISTORY:  Past Surgical History  Procedure Laterality Date  . Tonsillectomy and adenoidectomy  1970  . Bladder tack  11/1995  . Septoplasty  08/1998    with antral window   . Knee arthroscopy  12/12/2002    right   . Fracture surgery  02/09/2006    bilateral elbows  . Fracture surgery      right knee  . Fracture surgery      tib plateau  . Breast cyst aspiration  12/1998    bilaterally  . Breast biopsy  05/20/2000    bilaterally  . Tubal ligation  1970's  . Cataract extraction w/ intraocular lens  implant, bilateral    . Laparoscopic gastric banding  2008  . Cardiac catheterization  2004    SOCIAL HISTORY:   History   Social History  . Marital Status: Widowed    Spouse Name: N/A    Number of Children: 2  . Years of Education: N/A   Occupational History  . 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 Smoking status: Never Smoker   . Smokeless tobacco: Never Used  . Alcohol Use: No     Comment: none  . Drug Use: No  . Sexual Activity: No   Other Topics Concern  . Not on file   Social History Narrative   Lives with daughter and granddaughter.   Daughter is HPOA.   DNR.    FAMILY HISTORY:   Family Status  Relation Status Death Age  . Mother Deceased 67    natural causes  . Father Deceased 53    MI  . Sister Alive     DM-2  . Child Alive     2, HTN, DM-2, hyperlipidemia    ROS:  A complete 10 system review of systems was obtained and was unremarkable apart from what is mentioned above.  PHYSICAL EXAMINATION:    VITALS:   There were no vitals filed for this visit.   GEN:  The patient appears stated age and is in NAD. HEENT:  Normocephalic, atraumatic.  The  mucous membranes are moist. The superficial temporal arteries are without ropiness or tenderness. CV:  RRR Lungs:  CTAB Neck/HEME:  There are no carotid bruits bilaterally.  Neurological examination:  Orientation: The patient was alert and oriented.  MocA 04/11/2012 was 25/30 and today was 20/30. Cranial nerves: There is L facial droop with decreased forehead wrinkle on the L.  She has a positive stellwags sign. Pupils are equal round and reactive to light bilaterally. Fundoscopic exam reveals clear margins bilaterally. Extraocular muscles are intact but there is some upgaze paresis.   There are square wave jerks and difficulty with smooth pursuit.  The visual fields are full to confrontational testing. The speech is hypophonic but fluent and clear. She has no trouble repeating gutteral sounds.  Soft palate rises symmetrically and there is no tongue deviation. Hearing is intact to conversational tone. Sensation: Sensation is intact to light and pinprick throughout (facial, trunk, extremities). Vibration is intact at the bilateral big toe. There is no extinction with double simultaneous stimulation. There is no sensory dermatomal level identified. Motor: Strength is 5/5 in the bilateral upper and lower extremities.   Shoulder shrug is equal and symmetric.  There is no pronator drift. Deep tendon reflexes: Deep tendon reflexes are 3/4 at the bilateral biceps, triceps, brachioradialis, and achilles. There are cross adductor reflexes at the knees bilaterally.  Plantar responses are upgoing  bilaterally.  Movement examination: Tone: There is normal tone in the bilateral upper extremities today. The tone in the lower extremities is normal.  Abnormal movements: Rare tremor with activation on the L that was seen previously is not present today. Coordination:  There is decremation with RAM's, including heel taps bilaterally, finger taps and toe taps, most prominent on the L. Gait and Station: The patient is  short stepped and mildly unstable.  She has some difficulty with turns.  LABS:  Lab Results  Component Value Date   WBC 6.9 11/16/2012   HGB 13.1 11/16/2012   HCT 38.9 11/16/2012   MCV 95.9 11/16/2012   PLT 207.0 11/16/2012     Chemistry      Component Value Date/Time   NA 141 11/16/2012 0822   K 4.2 11/16/2012 0822   CL 104 11/16/2012 0822   CO2 32 11/16/2012 0822   BUN 20 11/16/2012 0822   CREATININE 0.7 11/16/2012 0822      Component Value Date/Time   CALCIUM 9.8 11/16/2012 0822   ALKPHOS 47 11/16/2012 0822   AST 16 11/16/2012 0822   ALT 10 11/16/2012 0822   BILITOT 0.5 11/16/2012 0822     Lab Results  Component Value Date   TSH 0.481 03/27/2012   Lab Results  Component Value Date   VITAMINB12 1387* 11/16/2012    Lab Results  Component Value Date   HGBA1C 5.8 11/23/2012    ASSESSMENT/PLAN: 1.  Parkinsonism.   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  Her primary issue is gait instability with slowness of movement.  While she does have some memory loss, this is not a prominent feature.  I believe that FTD and LBD are lower on the list of Ddx, given the relative lack of personality changes per family.  The hallucinations were likely medication induced.  -she will remain on carbidopa/levodopa 25/100, 3 tablets 3 times per day.  When this medication was attempted to be weaned in the past, walking got worse.  Risks, benefits, side effects and alternative therapies were discussed.  The opportunity to ask questions was given and they were answered to the best of my ability.  The patient expressed understanding and willingness to follow the outlined treatment protocols.  - I am [facility]. 2.  Hx of seizure.  This is likely secondary to the initiation of wellbutrin.  She should remain off of this medication.  EEG in the hospital was normal.   3.  Depression.  I agree with the addition of remeron  which has helped sleep, appetite and anxiety.    -she looks better today in this regard, and feels better.  She is unwilling to go back to Dr. Dellia Cloud. 4.  Headache.  -I. will draw a sedimentation rate today.  -I am going to cautiously give her a Medrol Dosepak.  I asked her to watch her blood sugars closely, and she already takes them twice per day.  She will let me know if she has elevated blood sugars, or if the medication does not help.  Risks, benefits, side effects and alternative therapies were discussed.  The opportunity to ask questions was given and they were answered to the best of my ability.  The patient expressed understanding and willingness to follow the outlined treatment protocols. 5.  memory changes, likely related to #1.  -We initiated the Exelon patch, 4.6 mg today.  She will use this for a month and then increase to 9.5 mg patch.  Risks, benefits, side effects and alternative therapies were discussed.  The opportunity to ask questions was given and they were answered to the best of my ability.  The patient expressed understanding and willingness to follow the outlined treatment protocols. 6. I will see her back in the next 6 months, sooner should new neurologic issues arise.

## 2013-04-15 ENCOUNTER — Other Ambulatory Visit: Payer: Self-pay | Admitting: Neurology

## 2013-04-15 ENCOUNTER — Other Ambulatory Visit: Payer: Self-pay | Admitting: Family Medicine

## 2013-04-16 NOTE — Telephone Encounter (Signed)
Received refill request electronically. Last office visit 02/14/13/acute.  Is it okay to refill medication?

## 2013-04-16 NOTE — Telephone Encounter (Signed)
RX called to pharmacy.

## 2013-04-18 ENCOUNTER — Encounter: Payer: Self-pay | Admitting: Radiology

## 2013-04-18 ENCOUNTER — Telehealth: Payer: Self-pay

## 2013-04-18 ENCOUNTER — Telehealth: Payer: Self-pay | Admitting: Neurology

## 2013-04-18 DIAGNOSIS — Z0289 Encounter for other administrative examinations: Secondary | ICD-10-CM

## 2013-04-18 NOTE — Telephone Encounter (Signed)
VM from pt regarding recent meds from Dr. Arbutus Leas, prednisone and a patch.

## 2013-04-18 NOTE — Telephone Encounter (Signed)
Pt says that the prednisone is not helping her headaches and the patch is making her tired and constipated. She has a constant headache that nothing helps to relieve.

## 2013-04-19 ENCOUNTER — Ambulatory Visit: Payer: Self-pay | Admitting: Family Medicine

## 2013-04-19 MED ORDER — TIZANIDINE HCL 4 MG PO TABS: ORAL_TABLET | ORAL | Status: DC

## 2013-04-19 NOTE — Telephone Encounter (Signed)
How does she know that the patch is causing those sx's and not the steroid?  I would not stop the patch yet because those would be unusual side effects (especially the constipation).  Give her RX for zanaflex - 4mg  - 1/2 tab at night for a week, then 1 po q hs.  Will make her sleepy and may not need remeron.  Will take at least 4 weeks for [place] to know if helpful for headache.

## 2013-04-19 NOTE — Telephone Encounter (Signed)
I filled new prescription for Zanaflex. Pt's daughter [name] called and I relayed your message to her. She is going to pick up the new medication and give it to her mother.

## 2013-04-20 ENCOUNTER — Encounter: Payer: Self-pay | Admitting: Family Medicine

## 2013-04-20 ENCOUNTER — Ambulatory Visit (INDEPENDENT_AMBULATORY_CARE_PROVIDER_SITE_OTHER): Payer: Medicare Other | Admitting: Family Medicine

## 2013-04-20 VITALS — BP 126/78 | HR 80 | Temp 98.0°F | Ht 62.5 in | Wt 159.0 lb

## 2013-04-20 DIAGNOSIS — N390 Urinary tract infection, site not specified: Secondary | ICD-10-CM

## 2013-04-20 DIAGNOSIS — R300 Dysuria: Secondary | ICD-10-CM

## 2013-04-20 LAB — POCT UA - MICROSCOPIC ONLY
Casts, Ur, LPF, POC: 0
RBC, urine, microscopic: 0
Yeast, UA: 0

## 2013-04-20 LAB — POCT URINALYSIS DIPSTICK
Bilirubin, UA: NEGATIVE
Blood, UA: NEGATIVE
Glucose, UA: NEGATIVE
Ketones, UA: NEGATIVE
Nitrite, UA: NEGATIVE
Protein, UA: NEGATIVE
Spec Grav, UA: 1.015
Urobilinogen, UA: 0.2
pH, UA: 7.5

## 2013-04-20 MED ORDER — CIPROFLOXACIN HCL 250 MG PO TABS: 250.0000 mg | ORAL_TABLET | Freq: Two times a day (BID) | ORAL | Status: DC

## 2013-04-20 NOTE — Assessment & Plan Note (Signed)
Recurrent s/p urol eval  tx with cipro for 5 d Pending cx Update if not starting to improve in a several days  or if worsening   inst to drink water

## 2013-04-20 NOTE — Progress Notes (Signed)
Subjective:    Patient ID: [name], female    DOB: [birth year], 67 y.o.   MRN: [id number]  HPI Here for urinary symptoms   Feels tired and run down  Also some odor with urine  She does not tend to get frequency or urgency or dysuria Gets uti often- Dr Patsi Sears did f/u-no particular course   No fever  No nausea   Patient Active Problem List   Diagnosis Date Noted  . UTI (urinary tract infection) 04/20/2013  . DNR (do not resuscitate) 11/23/2012  . 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 Seizures 04/03/2012  . Dysphagia 12/07/2011  . Orthostasis 12/07/2011  . Bell's palsy   . 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 Routine general medical examination at a health care facility 09/21/2011  . Loss of perception for taste 04/07/2011  . Loss, sense of, smell 04/07/2011  . Hypersomnia 01/18/2011  . OSTEOPENIA 05/21/2010  . ANXIETY DEPRESSION 04/16/2010  . OBESITY, MORBID 02/16/2007  . AODM 01/31/2007  . HYPERLIPIDEMIA, MIXED 01/31/2007  . HYPERTENSION, BENIGN ESSENTIAL 01/31/2007   Past Medical History  Diagnosis Date  . Dysmetabolic syndrome X   . Personal history of thrombophlebitis   . Essential hypertension, benign   . Morbid obesity   . Mixed hyperlipidemia   . Depressive disorder, not elsewhere classified   . Bell's palsy     1985  . Adjustment disorder with depressed mood   . DVT (deep venous thrombosis), left "early 2000's"    RLE  . Syncope and collapse 12/06/11    "this am; I pass out fairly often"  . Type II or unspecified type diabetes mellitus without mention of complication, not stated as uncontrolled 12/06/11    "not anymore; had lap band OR"  . Neurodegenerative gait disorder   . WUJWJXBJ(478.2) 05/2011    "pretty often since they put me on Parkinson's medicine"  . Parkinson disease     "they think that's what it is"  . DEMENTIA   . 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 Anxiety   . History of shingles    "as a teen and in my 58's"   Past Surgical History  Procedure Laterality Date  . Tonsillectomy and adenoidectomy  1970  . Bladder tack  11/1995  . Septoplasty  08/1998    with antral window   . Knee arthroscopy  12/12/2002    right   . Fracture surgery  02/09/2006    bilateral elbows  . Fracture surgery      right knee  . Fracture surgery      tib plateau  . Breast cyst aspiration  12/1998    bilaterally  . Breast biopsy  05/20/2000    bilaterally  . Tubal ligation  1970's  . Cataract extraction w/ intraocular lens  implant, bilateral    . Laparoscopic gastric banding  2008  . Cardiac catheterization  2004   History  Substance Use Topics  . Smoking status: Never Smoker   . Smokeless tobacco: Never Used  . Alcohol Use: No     Comment: none   Family History  Problem Relation Age of Onset  . COPD Father   . Diabetes Sister   . Vision loss Sister     legally blind  . Asthma Mother   . Heart disease Father    Allergies  Allergen Reactions  . Codeine Sulfate     REACTION: hallucinations  . Penicillins Anaphylaxis, Swelling  and Rash  . Wellbutrin [Bupropion] Other (See Comments)    Seizures and hallucinations   Current Outpatient Prescriptions on File Prior to Visit  Medication Sig Dispense Refill  . ALPRAZolam (XANAX) 0.5 MG tablet TAKE 1 TABLET BY MOUTH TWICE A DAY  60 tablet  0  . aspirin 81 MG tablet Take 81 mg by mouth daily.       . carbidopa-levodopa (SINEMET IR) 25-100 MG per tablet TAKE 3 TABLETS BY MOUTH 3 (THREE) TIMES DAILY.  270 tablet  5  . cetirizine (ZYRTEC) 10 MG tablet Take 10 mg by mouth daily.      . Cholecalciferol (VITAMIN D3) 1000 UNITS CAPS Take 1 capsule by mouth daily.        . feeding supplement (ENSURE COMPLETE) LIQD Take 237 mLs by mouth 2 (two) times daily between meals.  1 Bottle  11  . FLUoxetine (PROZAC) 20 MG capsule TAKE ONE CAPSULE BY MOUTH EVERY DAY  90 capsule  0  . losartan-hydrochlorothiazide (HYZAAR) 50-12.5 MG per tablet TAKE  ONE BY MOUTH DAILY  90 tablet  1  . mirtazapine (REMERON) 30 MG tablet TAKE 1 TABLET BY MOUTH AT BEDTIME  30 tablet  5  . Ranitidine HCl ([facility] RANITIDINE PO) Take one by mouth daily, patient unsure of strength      . rivastigmine (EXELON) 9.5 mg/24hr Place 1 patch (9.5 mg total) onto the skin daily.  30 patch  11  . simvastatin (ZOCOR) 80 MG tablet TAKE 1 TABLET (80 MG TOTAL) BY MOUTH AT BEDTIME.  90 tablet  1  . tiZANidine (ZANAFLEX) 4 MG tablet 1/2 tab at night for a week, then 1 tab at night.  30 tablet  0  . vitamin B-12 (CYANOCOBALAMIN) 1000 MCG tablet Take 1,000 mcg by mouth daily.         No current facility-administered medications on file prior to visit.    Review of Systems Review of Systems  Constitutional: Negative for fever, appetite change, and unexpected weight change.  ENT pos for bells palsy and loss of sense of smell Eyes: Negative for pain and visual disturbance.  Respiratory: Negative for cough and shortness of breath.   Cardiovascular: Negative for cp or palpitations    Gastrointestinal: Negative for nausea, diarrhea and constipation.  Genitourinary: Negative for urgency and frequency. neg for hematuria or flank pain  Skin: Negative for pallor or rash   Neurological: Negative for weakness, light-headedness, numbness and headaches. pos for parkinsons/ bradykinesia and memory loss  Hematological: Negative for adenopathy. Does not bruise/bleed easily.  Psychiatric/Behavioral: Negative for dysphoric mood. The patient is not nervous/anxious.         Objective:   Physical Exam  Constitutional: She appears well-developed and well-nourished. No distress.  HENT:  Head: Normocephalic and atraumatic.  Eyes: Conjunctivae and EOM are normal. Pupils are equal, round, and reactive to light.  Neck: Normal range of motion. Neck supple.  Cardiovascular: Normal rate and regular rhythm.   Pulmonary/Chest: Effort normal and breath sounds normal.  Abdominal: Soft. Bowel sounds are  normal. She exhibits no distension and no mass. There is no tenderness. There is no rebound and no guarding.  No suprapubic tenderness or fullness   No cva tenderness   Musculoskeletal: She exhibits no edema.  Lymphadenopathy:    She has no cervical adenopathy.  Neurological: She is alert. She has normal reflexes.  Skin: Skin is warm and dry. No rash noted. No erythema. No pallor.  Psychiatric: She has a normal mood  and affect.          Assessment & Plan:

## 2013-04-20 NOTE — Patient Instructions (Addendum)
Take the cipro as directed  Drink lots of fluids  We will update you when urine culture comes back Update if not starting to improve in a week or if worsening

## 2013-04-24 LAB — URINE CULTURE: Colony Count: >=100000

## 2013-04-26 ENCOUNTER — Encounter: Payer: Self-pay | Admitting: Family Medicine

## 2013-04-26 ENCOUNTER — Ambulatory Visit (INDEPENDENT_AMBULATORY_CARE_PROVIDER_SITE_OTHER): Payer: Medicare Other | Admitting: Family Medicine

## 2013-04-26 VITALS — BP 138/78 | HR 82 | Temp 97.8°F | Wt 156.2 lb

## 2013-04-26 DIAGNOSIS — N390 Urinary tract infection, site not specified: Secondary | ICD-10-CM

## 2013-04-26 LAB — POCT URINALYSIS DIPSTICK
Bilirubin, UA: NEGATIVE
Blood, UA: NEGATIVE
Glucose, UA: NEGATIVE
Ketones, UA: NEGATIVE
Leukocytes, UA: NEGATIVE
Nitrite, UA: NEGATIVE
Spec Grav, UA: 1.01
Urobilinogen, UA: 0.2
pH, UA: 7

## 2013-04-26 NOTE — Progress Notes (Signed)
Subjective:    Patient ID: [name], female    DOB: 07-31-1945, 67 y.o.   MRN: [id number]  HPI Very pleasant 67 yo female here with her daughter with h/o recurrent UTIs here for follow up.  Followed by urology as well (Dr. Patsi Sears).  Next appointment is not until February 2015.  Saw Dr. Milinda Antis on 9/26 for dysuria.  Urine cx grew >100,000 aerococcus placed on 5 day course of cipro 250 mg twice daily.  No dysuria currently.  Having burning in stomach. Daughter thinks smell of urine has improved.  Last positive urine cx was in 09/2012- >100,000 aerococcus.   Patient Active Problem List   Diagnosis Date Noted  . UTI (urinary tract infection) 04/20/2013  . DNR (do not resuscitate) 11/23/2012  . 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 Seizures 04/03/2012  . Dysphagia 12/07/2011  . Orthostasis 12/07/2011  . Bell's palsy   . 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 Routine general medical examination at a health care facility 09/21/2011  . Loss of perception for taste 04/07/2011  . Loss, sense of, smell 04/07/2011  . Hypersomnia 01/18/2011  . OSTEOPENIA 05/21/2010  . ANXIETY DEPRESSION 04/16/2010  . OBESITY, MORBID 02/16/2007  . AODM 01/31/2007  . HYPERLIPIDEMIA, MIXED 01/31/2007  . HYPERTENSION, BENIGN ESSENTIAL 01/31/2007   Past Medical History  Diagnosis Date  . Dysmetabolic syndrome X   . Personal history of thrombophlebitis   . Essential hypertension, benign   . Morbid obesity   . Mixed hyperlipidemia   . Depressive disorder, not elsewhere classified   . Bell's palsy     1985  . Adjustment disorder with depressed mood   . DVT (deep venous thrombosis), left "early 2000's"    RLE  . Syncope and collapse 12/06/11    "this am; I pass out fairly often"  . Type II or unspecified type diabetes mellitus without mention of complication, not stated as uncontrolled 12/06/11    "not anymore; had lap band OR"  . Neurodegenerative gait disorder   . WUJWJXBJ(478.2) 05/2011     "pretty often since they put me on Parkinson's medicine"  . Parkinson disease     "they think that's what it is"  . DEMENTIA   . 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 Anxiety   . History of shingles     "as a teen and in my 53's"   Past Surgical History  Procedure Laterality Date  . Tonsillectomy and adenoidectomy  1970  . Bladder tack  11/1995  . Septoplasty  08/1998    with antral window   . Knee arthroscopy  12/12/2002    right   . Fracture surgery  02/09/2006    bilateral elbows  . Fracture surgery      right knee  . Fracture surgery      tib plateau  . Breast cyst aspiration  12/1998    bilaterally  . Breast biopsy  05/20/2000    bilaterally  . Tubal ligation  1970's  . Cataract extraction w/ intraocular lens  implant, bilateral    . Laparoscopic gastric banding  2008  . Cardiac catheterization  2004   History  Substance Use Topics  . Smoking status: Never Smoker   . Smokeless tobacco: Never Used  . Alcohol Use: No     Comment: none   Family History  Problem Relation Age of Onset  . COPD Father   . Diabetes Sister   .  Vision loss Sister     legally blind  . Asthma Mother   . Heart disease Father    Allergies  Allergen Reactions  . Codeine Sulfate     REACTION: hallucinations  . Penicillins Anaphylaxis, Swelling and Rash  . Wellbutrin [Bupropion] Other (See Comments)    Seizures and hallucinations   Current Outpatient Prescriptions on File Prior to Visit  Medication Sig Dispense Refill  . ALPRAZolam (XANAX) 0.5 MG tablet TAKE 1 TABLET BY MOUTH TWICE A DAY  60 tablet  0  . aspirin 81 MG tablet Take 81 mg by mouth daily.       . carbidopa-levodopa (SINEMET IR) 25-100 MG per tablet TAKE 3 TABLETS BY MOUTH 3 (THREE) TIMES DAILY.  270 tablet  5  . cetirizine (ZYRTEC) 10 MG tablet Take 10 mg by mouth daily.      . Cholecalciferol (VITAMIN D3) 1000 UNITS CAPS Take 1 capsule by mouth daily.        . ciprofloxacin (CIPRO)  250 MG tablet Take 1 tablet (250 mg total) by mouth 2 (two) times daily.  10 tablet  0  . feeding supplement (ENSURE COMPLETE) LIQD Take 237 mLs by mouth 2 (two) times daily between meals.  1 Bottle  11  . FLUoxetine (PROZAC) 20 MG capsule TAKE ONE CAPSULE BY MOUTH EVERY DAY  90 capsule  0  . losartan-hydrochlorothiazide (HYZAAR) 50-12.5 MG per tablet TAKE ONE BY MOUTH DAILY  90 tablet  1  . mirtazapine (REMERON) 30 MG tablet TAKE 1 TABLET BY MOUTH AT BEDTIME  30 tablet  5  . Ranitidine HCl ([facility] RANITIDINE PO) Take one by mouth daily, patient unsure of strength      . rivastigmine (EXELON) 9.5 mg/24hr Place 1 patch (9.5 mg total) onto the skin daily.  30 patch  11  . simvastatin (ZOCOR) 80 MG tablet TAKE 1 TABLET (80 MG TOTAL) BY MOUTH AT BEDTIME.  90 tablet  1  . tiZANidine (ZANAFLEX) 4 MG tablet 1/2 tab at night for a week, then 1 tab at night.  30 tablet  0  . vitamin B-12 (CYANOCOBALAMIN) 1000 MCG tablet Take 1,000 mcg by mouth daily.         No current facility-administered medications on file prior to visit.    Review of Systems See HPI No n/v/d No fevers + GERD    Objective:   Physical Exam  BP 138/78  Pulse 82  Temp(Src) 97.8 F (36.6 C) (Oral)  Wt 156 lb 4 oz (70.875 kg)  BMI 28.11 kg/m2  SpO2 96%  Constitutional: She appears well-developed and well-nourished. No distress.  HENT:  Head: Normocephalic and atraumatic.  Eyes: Conjunctivae and EOM are normal. Pupils are equal, round, and reactive to light.  Neck: Normal range of motion. Neck supple.  Cardiovascular: Normal rate and regular rhythm.   Pulmonary/Chest: Effort normal and breath sounds normal.  Abdominal: Soft. Bowel sounds are normal. She exhibits no distension and no mass. There is no tenderness. There is no rebound and no guarding.  No suprapubic tenderness or fullness  No cva tenderness   Musculoskeletal: She exhibits no edema.  Lymphadenopathy:    She has no cervical adenopathy.  Neurological: She is  alert. She has normal reflexes.  Skin: Skin is warm and dry. No rash noted. No erythema. No pallor.  Psychiatric: She has a normal mood and affect.      Assessment & Plan:  1. UTI (urinary tract infection) UA neg. Symptoms resolving. Reassurance provided.  Daughter interested in discussing prophylactic abx--advised against it at this point but I did suggest she discuss this with her urologist. The patient and her daughter indicate understanding of these issues and agrees with the plan.  - Urinalysis Dipstick

## 2013-05-04 ENCOUNTER — Telehealth: Payer: Self-pay | Admitting: Neurology

## 2013-05-04 NOTE — Telephone Encounter (Signed)
Received call from patient stating that rivastigmine is giving headaches.  Tried calling patient back, but there was no answer.  Message left on voicemail.  Chasey Dull K. Allena Katz, DO

## 2013-05-16 ENCOUNTER — Other Ambulatory Visit: Payer: Self-pay | Admitting: Neurology

## 2013-05-16 ENCOUNTER — Other Ambulatory Visit: Payer: Self-pay | Admitting: Family Medicine

## 2013-05-17 ENCOUNTER — Other Ambulatory Visit: Payer: Self-pay

## 2013-05-17 NOTE — Telephone Encounter (Signed)
Electronic refill request, Dr. Aron out of office, please advise  

## 2013-05-17 NOTE — Telephone Encounter (Signed)
Rx called in as directed.   

## 2013-05-17 NOTE — Telephone Encounter (Signed)
plz phone in alprazolam 

## 2013-05-17 NOTE — Telephone Encounter (Signed)
Refill request for Zanaflex.

## 2013-06-14 ENCOUNTER — Other Ambulatory Visit: Payer: Self-pay

## 2013-06-17 ENCOUNTER — Other Ambulatory Visit: Payer: Self-pay | Admitting: Family Medicine

## 2013-06-17 ENCOUNTER — Other Ambulatory Visit: Payer: Self-pay | Admitting: Neurology

## 2013-06-17 NOTE — Telephone Encounter (Signed)
plz phone in. 

## 2013-06-18 NOTE — Telephone Encounter (Signed)
Rx called in as directed.   

## 2013-07-13 ENCOUNTER — Other Ambulatory Visit: Payer: Self-pay | Admitting: Family Medicine

## 2013-07-15 ENCOUNTER — Other Ambulatory Visit: Payer: Self-pay | Admitting: Family Medicine

## 2013-07-15 NOTE — Telephone Encounter (Signed)
plz phone in. 

## 2013-07-16 NOTE — Telephone Encounter (Signed)
Rx called in as directed.   

## 2013-07-30 ENCOUNTER — Ambulatory Visit (INDEPENDENT_AMBULATORY_CARE_PROVIDER_SITE_OTHER): Payer: Medicare Other | Admitting: Neurology

## 2013-07-30 ENCOUNTER — Encounter: Payer: Self-pay | Admitting: Neurology

## 2013-07-30 VITALS — BP 138/80 | HR 60 | Temp 97.6°F | Resp 14 | Ht 61.0 in | Wt 158.4 lb

## 2013-07-30 DIAGNOSIS — R51 Headache: Secondary | ICD-10-CM

## 2013-07-30 DIAGNOSIS — G8929 Other chronic pain: Secondary | ICD-10-CM

## 2013-07-30 DIAGNOSIS — G219 Secondary parkinsonism, unspecified: Secondary | ICD-10-CM

## 2013-07-30 DIAGNOSIS — R519 Headache, unspecified: Secondary | ICD-10-CM

## 2013-07-30 NOTE — Progress Notes (Signed)
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  This patient is accompanied in the office by her child who supplements the history.      She is on carbidopa/levodopa 25/100, 3 tablets 3 times per day.  She is doing well on this medication, without side effects.  She had one fall the day before Thanksgiving.  She was in a parking lot of a restaurant and tripped over something(curb).  She fx her nose and had to have her lip stitched up.    Since then, however, she has done very well.  Her daughter reports that she has been very stable on her feet.  Last visit, she was complaining of a decreased appetite and increased anxiety.  Therefore, I increased her Remeron to 30 mg at night.  This helped with both sleep and appetite and she is very pleased.  She also has a hx of seizure, likely secondary to Wellbutrin.  Last visit, her keppra was d/c.  She has been seizure free since that time.  However, mood and depression continue to be an issue.  I sent her to Dr. Cheryln [name] but she didn't follow up.  She states that she cannot talk to him without her daughter present.  Her biggest issue is that her granddaughter is a Ship broker and she lives with the patient.  Apparently, the majority of the house is clean and safe with the exception of the granddaughters room.  The patient states that the mess just drives her crazy and she cannot stop thinking about it.  Her granddaughter goes to community college and her leaving the house is not an option.  04/03/13 update:  [name].  She was supposed to f/u with Dr [name] due to significant anxiety and depression and she cancelled her 3/4, 3/13 and 3/27 visits and never r/s.  She has not seen him since 10/13.  She states that she is just "not going to talk with someone."  She, however, is doing some better in that regard.  Much of her stress surrounds her granddaughter, who lives with  her and is a Ship broker.  The patient states that her granddaughter has now admitted to this, but to seek help.  They were able to get some of the room cleaned up a while ago and that seemed to really help the patient's mental state.  However, the room has reconnected and is now just as it previously was.  She also reports a significant amount of stress over her sister who has end-stage Alzheimer's dementia.  Her sister no longer recognizes her, and the patient realizes when she goes around her sister, it makes her symptoms much worse.  She feels that her memory is getting worse, but nothing like her sisters.  She also thinks that her speech is getting worse.    She is having some headaches, similar to what she had in the past, prior to starting on the Levodopa.  It is over the L forehead, but not in the eye.  It is pounding.  It is daily.  It does not cause n/v or cause photophobia or phonophobia.  It makes her feel "drained."  It does not prevent her from doing things.  There are no lateralizing paresthesias with the headaches.  There are no vision changes.  07/30/13 update:  The pt is on carbidopa/levodopa 25/100 three tablets three times per day.  She is doing well in that  regard.  She did not go to PT because of transportation issues.  She was started on zanaflex last visit for HA and was also started on the exelon patch.  Interestingly, she states that the exelon patch caused headaches but it was started after the headache.  Nonetheless, she stopped it and still has headache.  It starts in the L forehead and radiates over the L side of the head.  She still thinks that the patch made them worse however and also thinks that it caused UTI's.  She is still on zanaflex but isn't sure its helping.  No falls/hallucination/syncope/lightheadedness.  Wearing off:  no  How long before next dose:  n/a Falls:   yes, last in Nov 2013 N/V:  no Hallucinations:  no  visual distortions: no N/V:  no Lightheaded:  Rarely  (1-2 times per month)  Syncope: no Dyskinesia:  no  Neuroimaging has previously been performed.   It was done on 04/23/11 and demonstrated significant atrophy especially in the temporal regions and moderate small vessel disease.  Gad was not given.   PREVIOUS MEDICATIONS: Sinemet  ALLERGIES:   Allergies  Allergen Reactions  . Codeine Sulfate     REACTION: hallucinations  . Penicillins Anaphylaxis, Swelling and Rash  . Wellbutrin [Bupropion] Other (See Comments)    Seizures and hallucinations    CURRENT MEDICATIONS:  Current Outpatient Prescriptions on File Prior to Visit  Medication Sig Dispense Refill  . ALPRAZolam (XANAX) 0.5 MG tablet TAKE 1 TABLET BY MOUTH TWICE A DAY  60 tablet  0  . aspirin 81 MG tablet Take 81 mg by mouth daily.       . carbidopa-levodopa (SINEMET IR) 25-100 MG per tablet TAKE 3 TABLETS BY MOUTH 3 (THREE) TIMES DAILY.  270 tablet  5  . cetirizine (ZYRTEC) 10 MG tablet Take 10 mg by mouth daily.      . Cholecalciferol (VITAMIN D3) 1000 UNITS CAPS Take 1 capsule by mouth daily.        . feeding supplement (ENSURE COMPLETE) LIQD Take 237 mLs by mouth 2 (two) times daily between meals.  1 Bottle  11  . FLUoxetine (PROZAC) 20 MG capsule TAKE ONE CAPSULE BY MOUTH EVERY DAY  90 capsule  1  . losartan-hydrochlorothiazide (HYZAAR) 50-12.5 MG per tablet TAKE ONE BY MOUTH DAILY  90 tablet  1  . mirtazapine (REMERON) 30 MG tablet TAKE 1 TABLET BY MOUTH AT BEDTIME  30 tablet  5  . Ranitidine HCl ([facility] RANITIDINE PO) Take one by mouth daily, patient unsure of strength      . simvastatin (ZOCOR) 80 MG tablet TAKE 1 TABLET (80 MG TOTAL) BY MOUTH AT BEDTIME.  90 tablet  1  . tiZANidine (ZANAFLEX) 4 MG tablet Take 1 tablet (4 mg total) by mouth at bedtime.  30 tablet  4  . vitamin B-12 (CYANOCOBALAMIN) 1000 MCG tablet Take 1,000 mcg by mouth daily.         No current facility-administered medications on file prior to visit.    PAST MEDICAL HISTORY:   Past Medical History   Diagnosis Date  . Dysmetabolic syndrome X   . Personal history of thrombophlebitis   . Essential hypertension, benign   . Morbid obesity   . Mixed hyperlipidemia   . Depressive disorder, not elsewhere classified   . Bell's palsy     1985  . Adjustment disorder with depressed mood   . DVT (deep venous thrombosis), left "early 2000's"    RLE  .  Syncope and collapse 12/06/11    "this am; I pass out fairly often"  . Type II or unspecified type diabetes mellitus without mention of complication, not stated as uncontrolled 12/06/11    "not anymore; had lap band OR"  . Neurodegenerative gait disorder   . DJSHFWYO(378.5) 05/2011    "pretty often since they put me on Parkinson's medicine"  . Parkinson disease     "they think that's what it is"  . DEMENTIA   . 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 Anxiety   . History of shingles     "as a teen and in my 75's"    PAST SURGICAL HISTORY:   Past Surgical History  Procedure Laterality Date  . Tonsillectomy and adenoidectomy  1970  . Bladder tack  11/1995  . Septoplasty  08/1998    with antral window   . Knee arthroscopy  12/12/2002    right   . Fracture surgery  02/09/2006    bilateral elbows  . Fracture surgery      right knee  . Fracture surgery      tib plateau  . Breast cyst aspiration  12/1998    bilaterally  . Breast biopsy  05/20/2000    bilaterally  . Tubal ligation  1970's  . Cataract extraction w/ intraocular lens  implant, bilateral    . Laparoscopic gastric banding  2008  . Cardiac catheterization  2004    SOCIAL HISTORY:   History   Social History  . Marital Status: Widowed    Spouse Name: N/A    Number of Children: 2  . Years of Education: N/A   Occupational History  . 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 Smoking status: Never Smoker   . Smokeless tobacco: Never Used  . Alcohol Use: No     Comment: none  . Drug Use: No  .  Sexual Activity: No   Other Topics Concern  . Not on file   Social History Narrative   Lives with daughter and granddaughter.   Daughter is HPOA.   DNR.    FAMILY HISTORY:   Family Status  Relation Status Death Age  . Mother Deceased 75    natural causes  . Father Deceased 74    MI  . Sister Alive     DM-2  . Child Alive     2, HTN, DM-2, hyperlipidemia    ROS:  A complete 10 system review of systems was obtained and was unremarkable apart from what is mentioned above.  PHYSICAL EXAMINATION:    VITALS:   Filed Vitals:   07/30/13 0936  BP: 138/80  Pulse: 60  Temp: 97.6 F (36.4 C)  Resp: 14  Height: 5\' 1"  (1.549 m)  Weight: 158 lb 6.4 oz (71.85 kg)     GEN:  The patient appears stated age and is in NAD. HEENT:  Normocephalic, atraumatic.  The mucous membranes are moist. The superficial temporal arteries are without ropiness or tenderness. CV:  RRR Lungs:  CTAB Neck/HEME:  There are no carotid bruits bilaterally.  Neurological examination:  Orientation: The patient was alert and oriented.  MocA 04/11/2012 was 25/30 and 04/03/13 was 20/30. Cranial nerves: There is L facial droop with decreased forehead wrinkle on the L.  She has a positive stellwags sign. Pupils are equal round and reactive to light bilaterally. Fundoscopic exam reveals clear margins  bilaterally. Extraocular muscles are intact but there is some upgaze paresis.   There are square wave jerks and difficulty with smooth pursuit.  The visual fields are full to confrontational testing. The speech is hypophonic but fluent and clear. She has no trouble repeating gutteral sounds.  Soft palate rises symmetrically and there is no tongue deviation. Hearing is intact to conversational tone. Sensation: Sensation is intact to light and pinprick throughout (facial, trunk, extremities). Vibration is intact at the bilateral big toe. There is no extinction with double simultaneous stimulation. There is no sensory dermatomal  level identified. Motor: Strength is 5/5 in the bilateral upper and lower extremities.   Shoulder shrug is equal and symmetric.  There is no pronator drift. Deep tendon reflexes: Deep tendon reflexes are 3/4 at the bilateral biceps, triceps, brachioradialis, and achilles. There are cross adductor reflexes at the knees bilaterally.  Plantar responses are upgoing bilaterally.  Movement examination: Tone: There is normal tone in the bilateral upper extremities today. The tone in the lower extremities is normal.  Abnormal movements: Rare tremor with activation on the L that was seen previously is not present today. Coordination:  There is decremation with RAM's, including heel taps bilaterally, finger taps and toe taps, most prominent on the L. Gait and Station: The patient is short stepped and mildly unstable.  She has some difficulty with turns.  LABS:  Lab Results  Component Value Date   WBC 6.9 11/16/2012   HGB 13.1 11/16/2012   HCT 38.9 11/16/2012   MCV 95.9 11/16/2012   PLT 207.0 11/16/2012     Chemistry      Component Value Date/Time   NA 141 11/16/2012 0822   K 4.2 11/16/2012 0822   CL 104 11/16/2012 0822   CO2 32 11/16/2012 0822   BUN 20 11/16/2012 0822   CREATININE 0.7 11/16/2012 0822      Component Value Date/Time   CALCIUM 9.8 11/16/2012 0822   ALKPHOS 47 11/16/2012 0822   AST 16 11/16/2012 0822   ALT 10 11/16/2012 0822   BILITOT 0.5 11/16/2012 0822     Lab Results  Component Value Date   TSH 0.481 03/27/2012   Lab Results  Component Value Date   VITAMINB12 1387* 11/16/2012    Lab Results  Component Value Date   HGBA1C 5.8 11/23/2012   Lab Results  Component Value Date   ESRSEDRATE 18 04/03/2013    ASSESSMENT/PLAN: 1.  Parkinsonism.   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  Her primary issue is gait instability with slowness of movement.  While she does have some memory loss, this is not a prominent feature.  I believe that FTD and LBD are lower on the list of  Ddx, given the relative lack of personality changes per family.  The hallucinations were likely medication induced.  -she will remain on carbidopa/levodopa 25/100, 3 tablets 3 times per day.  When this medication was attempted to be weaned in the past, walking got worse.  Risks, benefits, side effects and alternative therapies were discussed.  The opportunity to ask questions was given and they were answered to the best of my ability.  The patient expressed understanding and willingness to follow the outlined treatment protocols.  - She was unable to go to the rehab center b/c of transportation issues. 2.  Hx of seizure.  This is likely secondary to the initiation of wellbutrin.  She should remain off of this medication.  EEG in the  hospital was normal.   3.  Depression.  I agree with the addition of remeron which has helped sleep, appetite and anxiety.    -she looks better today in this regard, and feels better.  She is unwilling to go back to Dr. Cheryln [name]. 4.  Headache.  -She will be referred to the [facility]. 5.  memory changes, likely related to #1.  -She thinks exelon patch made the HA worse and it has been d/c

## 2013-08-13 ENCOUNTER — Other Ambulatory Visit: Payer: Self-pay | Admitting: Family Medicine

## 2013-08-14 NOTE — Telephone Encounter (Signed)
Lm on pts vm informing her Rx has been faxed to requested pharmacy.  

## 2013-08-27 ENCOUNTER — Ambulatory Visit (INDEPENDENT_AMBULATORY_CARE_PROVIDER_SITE_OTHER): Payer: Medicare Other | Admitting: Internal Medicine

## 2013-08-27 ENCOUNTER — Encounter: Payer: Self-pay | Admitting: Internal Medicine

## 2013-08-27 VITALS — BP 118/82 | HR 97 | Temp 98.1°F | Wt 155.5 lb

## 2013-08-27 DIAGNOSIS — R300 Dysuria: Secondary | ICD-10-CM

## 2013-08-27 DIAGNOSIS — J069 Acute upper respiratory infection, unspecified: Secondary | ICD-10-CM

## 2013-08-27 DIAGNOSIS — N390 Urinary tract infection, site not specified: Secondary | ICD-10-CM

## 2013-08-27 LAB — POCT URINALYSIS DIPSTICK
Glucose, UA: NEGATIVE
Ketones, UA: NEGATIVE
Nitrite, UA: NEGATIVE
Spec Grav, UA: 1.01
Urobilinogen, UA: 0.2
pH, UA: 6.5

## 2013-08-27 MED ORDER — NITROFURANTOIN MONOHYD MACRO 100 MG PO CAPS: 100.0000 mg | ORAL_CAPSULE | Freq: Two times a day (BID) | ORAL | Status: DC

## 2013-08-27 NOTE — Addendum Note (Signed)
Addended by: Lurlean Nanny on: 08/27/2013 02:54 PM   Modules accepted: Orders

## 2013-08-27 NOTE — Progress Notes (Signed)
Pre-visit discussion using our clinic review tool. No additional management support is needed unless otherwise documented below in the visit note.  

## 2013-08-27 NOTE — Progress Notes (Signed)
HPI  Pt presents to the clinic today with c/o dysuria. She reports this started 2 days ago. She denies fever, chills, nausea or bank pain. She has not taken anything OTC for her symptoms.  Additionally, she c/o sore throat. This started 2 days ago. She also has a runny nose. She has tried coricidin HBP. She has not had sick contacts. She does not smoke.   Review of Systems  Past Medical History  Diagnosis Date  . Dysmetabolic syndrome X   . Personal history of thrombophlebitis   . Essential hypertension, benign   . Morbid obesity   . Mixed hyperlipidemia   . Depressive disorder, not elsewhere classified   . Bell's palsy     1985  . Adjustment disorder with depressed mood   . DVT (deep venous thrombosis), left "early 2000's"    RLE  . Syncope and collapse 12/06/11    "this am; I pass out fairly often"  . Type II or unspecified type diabetes mellitus without mention of complication, not stated as uncontrolled 12/06/11    "not anymore; had lap band OR"  . Neurodegenerative gait disorder   . NFAOZHYQ(657.8) 05/2011    "pretty often since they put me on Parkinson's medicine"  . Parkinson disease     "they think that's what it is"  . DEMENTIA   . 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 Anxiety   . History of shingles     "as a teen and in my 33's"    Family History  Problem Relation Age of Onset  . COPD Father   . Diabetes Sister   . Vision loss Sister     legally blind  . Asthma Mother   . Heart disease Father     History   Social History  . Marital Status: Widowed    Spouse Name: N/A    Number of Children: 2  . Years of Education: N/A   Occupational History  . 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 Smoking status: Never Smoker   . Smokeless tobacco: Never Used  . Alcohol Use: No     Comment: none  . Drug Use: No  . Sexual Activity: No   Other Topics Concern  . Not on file   Social  History Narrative   Lives with daughter and granddaughter.   Daughter is HPOA.   DNR.    Allergies  Allergen Reactions  . Codeine Sulfate     REACTION: hallucinations  . Penicillins Anaphylaxis, Swelling and Rash  . Wellbutrin [Bupropion] Other (See Comments)    Seizures and hallucinations    Constitutional: Denies fever, malaise, fatigue, headache or abrupt weight changes.  HEENT: Pt reports runny nose and sore throat. GU: Pt reports urgency, frequency and pain with urination. Denies burning sensation, blood in urine, odor or discharge. Skin: Denies redness, rashes, lesions or ulcercations.   No other specific complaints in a complete review of systems (except as listed in HPI above).    Objective:   Physical Exam  BP 118/82  Pulse 97  Temp(Src) 98.1 F (36.7 C) (Oral)  Wt 155 lb 8 oz (70.534 kg)  SpO2 98% Wt Readings from Last 3 Encounters:  08/27/13 155 lb 8 oz (70.534 kg)  07/30/13 158 lb 6.4 oz (71.85 kg)  04/26/13 156 lb 4 oz (70.875 kg)    General: Appears her stated age, well  developed, well nourished in NAD. HEENT: +PND, throat erythematous without exudate. Mild cervical adenopathy. Cardiovascular: Normal rate and rhythm. S1,S2 noted.  No murmur, rubs or gallops noted. No JVD or BLE edema. No carotid bruits noted. Pulmonary/Chest: Normal effort and positive vesicular breath sounds. No respiratory distress. No wheezes, rales or ronchi noted.  Abdomen: Soft and nontender. Normal bowel sounds, no bruits noted. No distention or masses noted. Liver, spleen and kidneys non palpable. Tender to palpation over the bladder area. No CVA tenderness.      Assessment & Plan:   Urgency, Frequency and Dysuria secondary to UTI:  Urinalysis- moderated lueks, foul odor, cloudy eRx sent if for Macrobid 100 mg BID x 5 days Drink plenty of fluids  Viral URI:  Get some rest and drink plenty of fluids Continue coricidin HBP  RTC as needed or if symptoms persist.

## 2013-08-27 NOTE — Addendum Note (Signed)
Addended by: Lurlean Nanny on: 08/27/2013 03:04 PM   Modules accepted: Orders

## 2013-08-27 NOTE — Patient Instructions (Signed)
Urinary Tract Infection  Urinary tract infections (UTIs) can develop anywhere along your urinary tract. Your urinary tract is your body's drainage system for removing wastes and extra water. Your urinary tract includes two kidneys, two ureters, a bladder, and a urethra. Your kidneys are a pair of bean-shaped organs. Each kidney is about the size of your fist. They are located below your ribs, one on each side of your spine.  CAUSES  Infections are caused by microbes, which are microscopic organisms, including fungi, viruses, and bacteria. These organisms are so small that they can only be seen through a microscope. Bacteria are the microbes that most commonly cause UTIs.  SYMPTOMS   Symptoms of UTIs may vary by age and gender of the patient and by the location of the infection. Symptoms in young women typically include a frequent and intense urge to urinate and a painful, burning feeling in the bladder or urethra during urination. Older women and men are more likely to be tired, shaky, and weak and have muscle aches and abdominal pain. A fever may mean the infection is in your kidneys. Other symptoms of a kidney infection include pain in your back or sides below the ribs, nausea, and vomiting.  DIAGNOSIS  To diagnose a UTI, your caregiver will ask you about your symptoms. Your caregiver also will ask to provide a urine sample. The urine sample will be tested for bacteria and white blood cells. White blood cells are made by your body to help fight infection.  TREATMENT   Typically, UTIs can be treated with medication. Because most UTIs are caused by a bacterial infection, they usually can be treated with the use of antibiotics. The choice of antibiotic and length of treatment depend on your symptoms and the type of bacteria causing your infection.  HOME CARE INSTRUCTIONS   If you were prescribed antibiotics, take them exactly as your caregiver instructs you. Finish the medication even if you feel better after you  have only taken some of the medication.   Drink enough water and fluids to keep your urine clear or pale yellow.   Avoid caffeine, tea, and carbonated beverages. They tend to irritate your bladder.   Empty your bladder often. Avoid holding urine for long periods of time.   Empty your bladder before and after sexual intercourse.   After a bowel movement, women should cleanse from front to back. Use each tissue only once.  SEEK MEDICAL CARE IF:    You have back pain.   You develop a fever.   Your symptoms do not begin to resolve within 3 days.  SEEK IMMEDIATE MEDICAL CARE IF:    You have severe back pain or lower abdominal pain.   You develop chills.   You have nausea or vomiting.   You have continued burning or discomfort with urination.  MAKE SURE YOU:    Understand these instructions.   Will watch your condition.   Will get help right away if you are not doing well or get worse.  Document Released: 04/21/2005 Document Revised: 01/11/2012 Document Reviewed: 08/20/2011  [employer] Patient Information 2014 [employer].

## 2013-08-28 LAB — URINE CULTURE
Colony Count: NO GROWTH
Organism ID, Bacteria: NO GROWTH

## 2013-09-06 ENCOUNTER — Other Ambulatory Visit: Payer: Self-pay | Admitting: Specialist

## 2013-09-06 DIAGNOSIS — R51 Headache: Principal | ICD-10-CM

## 2013-09-06 DIAGNOSIS — R519 Headache, unspecified: Secondary | ICD-10-CM

## 2013-09-11 ENCOUNTER — Other Ambulatory Visit: Payer: Self-pay | Admitting: Family Medicine

## 2013-09-12 NOTE — Telephone Encounter (Signed)
Pt requesting medication refill. Last ov 04/2013 with future appt scheduled for 11/2013. pls advise

## 2013-09-12 NOTE — Telephone Encounter (Signed)
Spoke to pt and informed her Rx has been called in to requested pharmacy 

## 2013-09-13 ENCOUNTER — Ambulatory Visit
Admission: RE | Admit: 2013-09-13 | Discharge: 2013-09-13 | Disposition: A | Discharge status: Home / Self Care | Source: Ambulatory Visit | Attending: Specialist | Admitting: Specialist

## 2013-09-13 DIAGNOSIS — R519 Headache, unspecified: Secondary | ICD-10-CM

## 2013-09-13 DIAGNOSIS — R51 Headache: Principal | ICD-10-CM

## 2013-09-13 MED ORDER — GADOBENATE DIMEGLUMINE 529 MG/ML IV SOLN: 14.0000 mL | Freq: Once | INTRAVENOUS | Status: AC | PRN

## 2013-10-02 ENCOUNTER — Ambulatory Visit: Payer: Self-pay | Admitting: Neurology

## 2013-10-10 ENCOUNTER — Other Ambulatory Visit: Payer: Self-pay | Admitting: Neurology

## 2013-10-10 NOTE — Telephone Encounter (Signed)
Remeron refill requested. Per last office note- patient to remain on medication. Refill approved and sent to patient's pharmacy.   

## 2013-10-14 ENCOUNTER — Other Ambulatory Visit: Payer: Self-pay | Admitting: Family Medicine

## 2013-10-15 NOTE — Telephone Encounter (Signed)
Pt requesting medication refill. Last ov 04/2013 with future appt sched for 11/2013. pls advise

## 2013-10-18 NOTE — Telephone Encounter (Signed)
Spoke to pts daughter and informed her Rx has been called in to the requested pharmacy

## 2013-10-29 ENCOUNTER — Other Ambulatory Visit: Payer: Self-pay | Admitting: Neurology

## 2013-10-30 NOTE — Telephone Encounter (Signed)
Carbidopa Levodopa refill requested. Per last office note- patient to remain on medication. Refill approved and sent to patient's pharmacy.   

## 2013-11-08 ENCOUNTER — Other Ambulatory Visit: Payer: Self-pay | Admitting: Neurology

## 2013-11-08 NOTE — Telephone Encounter (Signed)
IF she has already had appt with them, she can receive the medication from them as she had told me that she wasn't sure it was working anyway and I figured that they were likely going to be changing medication.

## 2013-11-08 NOTE — Telephone Encounter (Signed)
Last prescribed in November and patient referred to headache clinic in January - please advise if okay to refill?

## 2013-11-08 NOTE — Telephone Encounter (Signed)
Tried to call patient to see if she followed up with the headache clinic. Home number out of service. Cell number does not have a voicemail set up. RX declined with a message for patient to contact our office prior to filling RX.

## 2013-11-09 ENCOUNTER — Other Ambulatory Visit: Payer: Self-pay | Admitting: Neurology

## 2013-11-09 ENCOUNTER — Telehealth: Payer: Self-pay | Admitting: Neurology

## 2013-11-09 NOTE — Telephone Encounter (Signed)
Pt's daughter called/returning Jade's call at 12:37PM. Please call her back.

## 2013-11-09 NOTE — Telephone Encounter (Signed)
Left message on machine for patient to call back.

## 2013-11-09 NOTE — Telephone Encounter (Signed)
Spoke with daughter about Zanaflex. Made her aware per last conversation that this medication not helping. Patient is seeing Dr Domingo Cocking at [facility] and he doing trigger point injections. She will remain off the Zanaflex and discuss at their next appt.

## 2013-11-09 NOTE — Telephone Encounter (Signed)
needs to talk to someone about medication [phone] Kennyth Lose is her daughter

## 2013-11-11 ENCOUNTER — Other Ambulatory Visit: Payer: Self-pay | Admitting: Neurology

## 2013-11-12 ENCOUNTER — Other Ambulatory Visit: Payer: Self-pay | Admitting: *Deleted

## 2013-11-12 ENCOUNTER — Telehealth: Payer: Self-pay | Admitting: *Deleted

## 2013-11-12 NOTE — Telephone Encounter (Signed)
Zanaflex 4 mg was cancelled  at [facility]

## 2013-11-14 ENCOUNTER — Other Ambulatory Visit: Payer: Self-pay | Admitting: Family Medicine

## 2013-11-14 DIAGNOSIS — Z0000 Encounter for general adult medical examination without abnormal findings: Secondary | ICD-10-CM

## 2013-11-14 DIAGNOSIS — E119 Type 2 diabetes mellitus without complications: Secondary | ICD-10-CM

## 2013-11-14 DIAGNOSIS — E782 Mixed hyperlipidemia: Secondary | ICD-10-CM

## 2013-11-15 ENCOUNTER — Other Ambulatory Visit: Payer: Self-pay | Admitting: Family Medicine

## 2013-11-16 NOTE — Telephone Encounter (Signed)
Rx called in as directed.   

## 2013-11-16 NOTE — Telephone Encounter (Signed)
Last office visit 08/30/2013 with Webb Silversmith.  Last refilled 10/14/2013 for #60.  Ok to refill?

## 2013-11-22 ENCOUNTER — Other Ambulatory Visit (INDEPENDENT_AMBULATORY_CARE_PROVIDER_SITE_OTHER): Payer: Medicare Other

## 2013-11-22 DIAGNOSIS — E782 Mixed hyperlipidemia: Secondary | ICD-10-CM

## 2013-11-22 DIAGNOSIS — Z0000 Encounter for general adult medical examination without abnormal findings: Secondary | ICD-10-CM

## 2013-11-22 DIAGNOSIS — E119 Type 2 diabetes mellitus without complications: Secondary | ICD-10-CM

## 2013-11-22 LAB — CBC WITH DIFFERENTIAL/PLATELET
Basophils Absolute: 0 10*3/uL (ref 0.0–0.1)
Basophils Relative: 0.5 % (ref 0.0–3.0)
Eosinophils Absolute: 0.2 10*3/uL (ref 0.0–0.7)
Eosinophils Relative: 1.7 % (ref 0.0–5.0)
HCT: 40.4 % (ref 36.0–46.0)
Hemoglobin: 13.1 g/dL (ref 12.0–15.0)
Lymphocytes Relative: 31.4 % (ref 12.0–46.0)
Lymphs Abs: 3 10*3/uL (ref 0.7–4.0)
MCHC: 32.4 g/dL (ref 30.0–36.0)
MCV: 95.7 fl (ref 78.0–100.0)
Monocytes Absolute: 0.7 10*3/uL (ref 0.1–1.0)
Monocytes Relative: 7.6 % (ref 3.0–12.0)
Neutro Abs: 5.7 10*3/uL (ref 1.4–7.7)
Neutrophils Relative %: 58.8 % (ref 43.0–77.0)
Platelets: 219 10*3/uL (ref 150.0–400.0)
RBC: 4.23 Mil/uL (ref 3.87–5.11)
RDW: 13.9 % (ref 11.5–14.6)
WBC: 9.6 10*3/uL (ref 4.5–10.5)

## 2013-11-22 LAB — COMPREHENSIVE METABOLIC PANEL
ALT: 10 U/L (ref 0–35)
AST: 22 U/L (ref 0–37)
Albumin: 3.8 g/dL (ref 3.5–5.2)
Alkaline Phosphatase: 51 U/L (ref 39–117)
BUN: 24 mg/dL — ABNORMAL HIGH (ref 6–23)
CO2: 30 mEq/L (ref 19–32)
Calcium: 10.2 mg/dL (ref 8.4–10.5)
Chloride: 104 mEq/L (ref 96–112)
Creatinine, Ser: 0.6 mg/dL (ref 0.4–1.2)
GFR: 101.47 mL/min (ref >60.00)
Glucose, Bld: 87 mg/dL (ref 70–99)
Potassium: 4.3 mEq/L (ref 3.5–5.1)
Sodium: 141 mEq/L (ref 135–145)
Total Bilirubin: 0.7 mg/dL (ref 0.3–1.2)
Total Protein: 6.4 g/dL (ref 6.0–8.3)

## 2013-11-22 LAB — LIPID PANEL
Cholesterol: 153 mg/dL (ref 0–200)
HDL: 73.4 mg/dL (ref >39.00)
LDL Cholesterol: 57 mg/dL (ref 0–99)
Total CHOL/HDL Ratio: 2
Triglycerides: 111 mg/dL (ref 0.0–149.0)
VLDL: 22.2 mg/dL (ref 0.0–40.0)

## 2013-11-22 LAB — HEMOGLOBIN A1C: Hgb A1c MFr Bld: 6 % (ref 4.6–6.5)

## 2013-11-22 LAB — TSH: TSH: 0.75 u[IU]/mL (ref 0.35–5.50)

## 2013-11-26 ENCOUNTER — Encounter: Payer: Self-pay | Admitting: Family Medicine

## 2013-11-26 ENCOUNTER — Telehealth: Payer: Self-pay | Admitting: Family Medicine

## 2013-11-26 ENCOUNTER — Ambulatory Visit (INDEPENDENT_AMBULATORY_CARE_PROVIDER_SITE_OTHER): Payer: Medicare Other | Admitting: Family Medicine

## 2013-11-26 VITALS — BP 124/72 | HR 84 | Temp 97.6°F | Ht 62.0 in | Wt 153.8 lb

## 2013-11-26 DIAGNOSIS — Z1211 Encounter for screening for malignant neoplasm of colon: Secondary | ICD-10-CM

## 2013-11-26 DIAGNOSIS — E119 Type 2 diabetes mellitus without complications: Secondary | ICD-10-CM

## 2013-11-26 DIAGNOSIS — F341 Dysthymic disorder: Secondary | ICD-10-CM

## 2013-11-26 DIAGNOSIS — G3183 Dementia with Lewy bodies: Secondary | ICD-10-CM

## 2013-11-26 DIAGNOSIS — E782 Mixed hyperlipidemia: Secondary | ICD-10-CM

## 2013-11-26 DIAGNOSIS — Z0000 Encounter for general adult medical examination without abnormal findings: Secondary | ICD-10-CM

## 2013-11-26 DIAGNOSIS — F0280 Dementia in other diseases classified elsewhere without behavioral disturbance: Secondary | ICD-10-CM

## 2013-11-26 DIAGNOSIS — M899 Disorder of bone, unspecified: Secondary | ICD-10-CM

## 2013-11-26 DIAGNOSIS — M949 Disorder of cartilage, unspecified: Secondary | ICD-10-CM

## 2013-11-26 DIAGNOSIS — N390 Urinary tract infection, site not specified: Secondary | ICD-10-CM

## 2013-11-26 DIAGNOSIS — Z23 Encounter for immunization: Secondary | ICD-10-CM

## 2013-11-26 DIAGNOSIS — I10 Essential (primary) hypertension: Secondary | ICD-10-CM

## 2013-11-26 DIAGNOSIS — Z66 Do not resuscitate: Secondary | ICD-10-CM

## 2013-11-26 DIAGNOSIS — G20 Parkinson's disease: Secondary | ICD-10-CM

## 2013-11-26 LAB — MICROALBUMIN / CREATININE URINE RATIO
Creatinine,U: 119.1 mg/dL
Microalb Creat Ratio: 0.9 mg/g (ref 0.0–30.0)
Microalb, Ur: 1.1 mg/dL (ref 0.0–1.9)

## 2013-11-26 LAB — HM DIABETES FOOT EXAM

## 2013-11-26 LAB — URINALYSIS, MICROSCOPIC ONLY

## 2013-11-26 MED ORDER — CIPROFLOXACIN HCL 250 MG PO TABS: 250.0000 mg | ORAL_TABLET | Freq: Two times a day (BID) | ORAL | Status: DC

## 2013-11-26 NOTE — Assessment & Plan Note (Signed)
The patients weight, height, BMI and visual acuity have been recorded in the chart I have made referrals, counseling and provided education to the patient based review of the above and I have provided the pt with a written personalized care plan for preventive services. Prevnar today. IFOB.

## 2013-11-26 NOTE — Progress Notes (Addendum)
Subjective:    Patient ID: [name], female    DOB: Mar 23, 1946, 68 y.o.   MRN: [id number]  HPI  68 yo pleasant female here for AMW visit.  I have personally reviewed the Medicare Annual Wellness questionnaire and have noted 1. The patient's medical and social history 2. Their use of alcohol, tobacco or illicit drugs 3. Their current medications and supplements 4. The patient's functional ability including ADL's, fall risks, home safety risks and hearing or visual impairment. 5. Diet and physical activities 6. Evidence for depression or mood disorders  Mammogram 01/05/13 Td 06/21/12 Zostavax 04/18/08 Pneumovax 04/16/10    End of life wishes discussed and updated in Social History.  PSP- followed by Dr. Carles Collet.  Last note reviewed from 04/03/13. Referred to neuro rehab and started Exelon patch.    Stopped taking the Exelon patch, made her feel "awful."  Went to headache center received trigger point injections with Dr. Domingo Cocking- these have helped with headaches.  Has appt with Dr. Carles Collet tomorrow.   Depression- On Remeron.  No longer seeing Dr. Cheryln Manly.  Was doing well but her sister died 2 weeks ago.  Feels she is coping ok- sad but feels she is just grieving.  No SI or HI.  Lab Results  Component Value Date   CHOL 153 11/22/2013   HDL 73.40 11/22/2013   LDLCALC 57 11/22/2013   TRIG 111.0 11/22/2013   CHOLHDL 2 11/22/2013   Lab Results  Component Value Date   CREATININE 0.6 11/22/2013   DM- diet controlled.  Has appt for eye exam later this month. Lab Results  Component Value Date   HGBA1C 6.0 11/22/2013     Patient Active Problem List   Diagnosis Date Noted  . DNR (do not resuscitate) 11/23/2012  . 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 Seizures 04/03/2012  . Dysphagia 12/07/2011  . Orthostasis 12/07/2011  . Bell's palsy   . 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 Routine general medical examination at a health care facility 09/21/2011  . Loss of perception for taste  04/07/2011  . Loss, sense of, smell 04/07/2011  . Hypersomnia 01/18/2011  . OSTEOPENIA 05/21/2010  . ANXIETY DEPRESSION 04/16/2010  . OBESITY, MORBID 02/16/2007  . AODM 01/31/2007  . HYPERLIPIDEMIA, MIXED 01/31/2007  . HYPERTENSION, BENIGN ESSENTIAL 01/31/2007   Past Medical History  Diagnosis Date  . Dysmetabolic syndrome X   . Personal history of thrombophlebitis   . Essential hypertension, benign   . Morbid obesity   . Mixed hyperlipidemia   . Depressive disorder, not elsewhere classified   . Bell's palsy     1985  . Adjustment disorder with depressed mood   . DVT (deep venous thrombosis), left "early 2000's"    RLE  . Syncope and collapse 12/06/11    "this am; I pass out fairly often"  . Type II or unspecified type diabetes mellitus without mention of complication, not stated as uncontrolled 12/06/11    "not anymore; had lap band OR"  . Neurodegenerative gait disorder   . YQMVHQIO(962.9) 05/2011    "pretty often since they put me on Parkinson's medicine"  . Parkinson disease     "they think that's what it is"  . DEMENTIA   . 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 Anxiety   . History of shingles     "as a teen and in my 68's"   Past Surgical History  Procedure Laterality Date  .  Tonsillectomy and adenoidectomy  1970  . Bladder tack  11/1995  . Septoplasty  08/1998    with antral window   . Knee arthroscopy  12/12/2002    right   . Fracture surgery  02/09/2006    bilateral elbows  . Fracture surgery      right knee  . Fracture surgery      tib plateau  . Breast cyst aspiration  12/1998    bilaterally  . Breast biopsy  05/20/2000    bilaterally  . Tubal ligation  1970's  . Cataract extraction w/ intraocular lens  implant, bilateral    . Laparoscopic gastric banding  2008  . Cardiac catheterization  2004   History  Substance Use Topics  . Smoking status: Never Smoker   . Smokeless tobacco: Never Used  . Alcohol Use: No      Comment: none   Family History  Problem Relation Age of Onset  . COPD Father   . Diabetes Sister   . Vision loss Sister     legally blind  . Asthma Mother   . Heart disease Father    Allergies  Allergen Reactions  . Codeine Sulfate     REACTION: hallucinations  . Penicillins Anaphylaxis, Swelling and Rash  . Wellbutrin [Bupropion] Other (See Comments)    Seizures and hallucinations   Current Outpatient Prescriptions on File Prior to Visit  Medication Sig Dispense Refill  . ALPRAZolam (XANAX) 0.5 MG tablet TAKE 1 TABLET BY MOUTH TWICE A DAY  60 tablet  0  . aspirin 81 MG tablet Take 81 mg by mouth daily.       . carbidopa-levodopa (SINEMET IR) 25-100 MG per tablet TAKE 3 TABLETS BY MOUTH 3 (THREE) TIMES DAILY.  270 tablet  5  . cetirizine (ZYRTEC) 10 MG tablet Take 10 mg by mouth daily.      . Cholecalciferol (VITAMIN D3) 1000 UNITS CAPS Take 1 capsule by mouth daily.        . feeding supplement (ENSURE COMPLETE) LIQD Take 237 mLs by mouth 2 (two) times daily between meals.  1 Bottle  11  . FLUoxetine (PROZAC) 20 MG capsule TAKE ONE CAPSULE BY MOUTH EVERY DAY  90 capsule  1  . losartan-hydrochlorothiazide (HYZAAR) 50-12.5 MG per tablet TAKE ONE BY MOUTH DAILY  90 tablet  1  . mirtazapine (REMERON) 30 MG tablet TAKE 1 TABLET BY MOUTH AT BEDTIME  30 tablet  5  . nitrofurantoin, macrocrystal-monohydrate, (MACROBID) 100 MG capsule Take 1 capsule (100 mg total) by mouth 2 (two) times daily.  10 capsule  0  . Ranitidine HCl ([facility] RANITIDINE PO) Take one by mouth daily, patient unsure of strength      . simvastatin (ZOCOR) 80 MG tablet TAKE 1 TABLET (80 MG TOTAL) BY MOUTH AT BEDTIME.  90 tablet  1  . tiZANidine (ZANAFLEX) 4 MG tablet REFILL DENIED- patient needs to contact our office.  1 tablet  0  . tiZANidine (ZANAFLEX) 4 MG tablet TAKE 1 TABLET BY MOUTH AT BEDTIME.  30 tablet  4  . vitamin B-12 (CYANOCOBALAMIN) 1000 MCG tablet Take 1,000 mcg by mouth daily.         No current  facility-administered medications on file prior to visit.   The PMH, PSH, Social History, Family History, Medications, and allergies have been reviewed in [facility], and have been updated if relevant.    Review of Systems See HPI    Patient reports no  vision/ hearing changes,anorexia, weight  change, fever ,adenopathy, persistant / recurrent hoarseness, swallowing issues, chest pain, edema,persistant / recurrent cough, hemoptysis, dyspnea(rest, exertional, paroxysmal nocturnal), gastrointestinal  bleeding (melena, rectal bleeding), abdominal pain, excessive heart burn, focal weakness, severe memory loss, concerning skin lesions, depression, anxiety, abnormal bruising/bleeding, major joint swelling, breast masses or abnormal vaginal bleeding.   + dysuria and increased urinary frequecy (h/o recurrent UTIs).  Objective:   Physical Exam BP 124/72  Pulse 84  Temp(Src) 97.6 F (36.4 C) (Oral)  Ht 5\' 2"  (1.575 m)  Wt 153 lb 12 oz (69.741 kg)  BMI 28.11 kg/m2  General: Well-developed,well-nourished,in no acute distress; alert,appropriate and cooperative throughout examination, minimally overweight.  Mouth: Oral mucosa and oropharynx without lesions or exudates. Teeth in good repair. Left facial paralysis unchanged.  Neck: No deformities, masses, or tenderness noted.  No bruits  Lungs: Normal respiratory effort, chest expands symmetrically. Lungs are clear to auscultation, no crackles or wheezes.  Heart: Normal rate and regular rhythm. S1 and S2 normal without gallop, murmur, click, rub or other extra sounds.  Abdomen: Bowel sounds positive,abdomen soft and non-tender without masses, organomegaly or hernias noted. NO CVA or suprapubic tenderness.  Extremities: No clubbing, cyanosis, edema, or deformity noted with normal full range of motion of all joints.  Neurologic: No cranial nerve deficits noted. Station and gait are normal. Sensory, motor and coordinative functions appear intact.  Psych:  Cognition and judgment appear intact. Alert and cooperative with normal attention span and concentration. 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 No skin breakdown No calluses  Normal DP pulses Normal sensation to light touch and monofilament Nails normal     Assessment & Plan:

## 2013-11-26 NOTE — Addendum Note (Signed)
Addended by: Daralene Milch C on: 11/26/2013 11:36 AM   Modules accepted: Orders

## 2013-11-26 NOTE — Assessment & Plan Note (Signed)
Diet controlled. Check urine micro today. She does have appt with optho for dilated eye exam later this month.

## 2013-11-26 NOTE — Telephone Encounter (Signed)
Relevant patient education assigned to patient using Emmi. ° °

## 2013-11-26 NOTE — Assessment & Plan Note (Signed)
Well controlled. No changes. 

## 2013-11-26 NOTE — Addendum Note (Signed)
Addended by: Lucille Passy on: 11/26/2013 03:18 PM   Modules accepted: Orders, Medications

## 2013-11-26 NOTE — Addendum Note (Signed)
Addended by: Emelia Salisbury C on: 11/26/2013 09:56 AM   Modules accepted: Orders

## 2013-11-26 NOTE — Assessment & Plan Note (Signed)
Deteriorated due to acute grief. She will update me with her symptoms.

## 2013-11-26 NOTE — Progress Notes (Signed)
Pre visit review using our clinic review tool, if applicable. No additional management support is needed unless otherwise documented below in the visit note. 

## 2013-11-26 NOTE — Assessment & Plan Note (Signed)
Followed by Dr. Carles Collet. Has appt to see her tomorrow.

## 2013-11-26 NOTE — Patient Instructions (Signed)
Great to see you. We will call you with your results. I am so sorry for your loss.

## 2013-11-27 ENCOUNTER — Telehealth: Payer: Self-pay | Admitting: Neurology

## 2013-11-27 ENCOUNTER — Encounter: Payer: Self-pay | Admitting: Neurology

## 2013-11-27 ENCOUNTER — Ambulatory Visit (INDEPENDENT_AMBULATORY_CARE_PROVIDER_SITE_OTHER): Payer: Medicare Other | Admitting: Neurology

## 2013-11-27 VITALS — BP 112/82 | HR 100 | Resp 20 | Ht 62.0 in | Wt 156.0 lb

## 2013-11-27 DIAGNOSIS — F3289 Other specified depressive episodes: Secondary | ICD-10-CM

## 2013-11-27 DIAGNOSIS — R2681 Unsteadiness on feet: Secondary | ICD-10-CM

## 2013-11-27 DIAGNOSIS — R51 Headache: Secondary | ICD-10-CM

## 2013-11-27 DIAGNOSIS — G231 Progressive supranuclear ophthalmoplegia [Steele-Richardson-Olszewski]: Secondary | ICD-10-CM

## 2013-11-27 DIAGNOSIS — G238 Other specified degenerative diseases of basal ganglia: Secondary | ICD-10-CM

## 2013-11-27 DIAGNOSIS — F329 Major depressive disorder, single episode, unspecified: Secondary | ICD-10-CM

## 2013-11-27 DIAGNOSIS — R269 Unspecified abnormalities of gait and mobility: Secondary | ICD-10-CM

## 2013-11-27 NOTE — Addendum Note (Signed)
Addended byAnnamaria Helling on: 11/27/2013 09:11 AM   Modules accepted: Orders

## 2013-11-27 NOTE — Patient Instructions (Signed)
1. Referral sent to [facility] for Physical Therapy. They will contact you with an appt.

## 2013-11-27 NOTE — Telephone Encounter (Signed)
Referral to [facility] for PT faxed to [phone] with confirmation received. They will contact patient.

## 2013-11-27 NOTE — Progress Notes (Signed)
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  This patient is accompanied in the office by her child who supplements the history.      She is on carbidopa/levodopa 25/100, 3 tablets 3 times per day.  She is doing well on this medication, without side effects.  She had one fall the day before Thanksgiving.  She was in a parking lot of a restaurant and tripped over something(curb).  She fx her nose and had to have her lip stitched up.    Since then, however, she has done very well.  Her daughter reports that she has been very stable on her feet.  Last visit, she was complaining of a decreased appetite and increased anxiety.  Therefore, I increased her Remeron to 30 mg at night.  This helped with both sleep and appetite and she is very pleased.  She also has a hx of seizure, likely secondary to Wellbutrin.  Last visit, her keppra was d/c.  She has been seizure free since that time.  However, mood and depression continue to be an issue.  I sent her to Dr. Cheryln Manly but she didn't follow up.  She states that she cannot talk to him without her daughter present.  Her biggest issue is that her granddaughter is a Ship broker and she lives with the patient.  Apparently, the majority of the house is clean and safe with the exception of the granddaughters room.  The patient states that the mess just drives her crazy and she cannot stop thinking about it.  Her granddaughter goes to community college and her leaving the house is not an option.  04/03/13 update:  [name].  She was supposed to f/u with Dr Cheryln Manly due to significant anxiety and depression and she cancelled her 3/4, 3/13 and 3/27 visits and never r/s.  She has not seen him since 10/13.  She states that she is just "not going to talk with someone."  She, however, is doing some better in that regard.  Much of her stress surrounds her granddaughter, who lives with  her and is a Ship broker.  The patient states that her granddaughter has now admitted to this, but to seek help.  They were able to get some of the room cleaned up a while ago and that seemed to really help the patient's mental state.  However, the room has reconnected and is now just as it previously was.  She also reports a significant amount of stress over her sister who has end-stage Alzheimer's dementia.  Her sister no longer recognizes her, and the patient realizes when she goes around her sister, it makes her symptoms much worse.  She feels that her memory is getting worse, but nothing like her sisters.  She also thinks that her speech is getting worse.    She is having some headaches, similar to what she had in the past, prior to starting on the Levodopa.  It is over the L forehead, but not in the eye.  It is pounding.  It is daily.  It does not cause n/v or cause photophobia or phonophobia.  It makes her feel "drained."  It does not prevent her from doing things.  There are no lateralizing paresthesias with the headaches.  There are no vision changes.  07/30/13 update:  The pt is on carbidopa/levodopa 25/100 three tablets three times per day.  She is doing well in that  regard.  She did not go to PT because of transportation issues.  She was started on zanaflex last visit for HA and was also started on the exelon patch.  Interestingly, she states that the exelon patch caused headaches but it was started after the headache.  Nonetheless, she stopped it and still has headache.  It starts in the L forehead and radiates over the L side of the head.  She still thinks that the patch made them worse however and also thinks that it caused UTI's.  She is still on zanaflex but isn't sure its helping.  No falls/hallucination/syncope/lightheadedness.  11/27/13 update:  Patient is following up today in the movement disorder clinic.  She is currently on carbidopa/levodopa 25/100, 3 tablets by mouth 3 times per day.  Since our  last visit, I did get a note from Dr. Domingo Cocking at the headache clinic.  He started the patient on Indocin 25 mg 3 times a day and she was told to titrate to 75 mg 3 times a day.  She states that she had SE with that and she was changed to gabapentin 100 mg and that is helping.  He also scheduled her for trigger point injections and an MRI brain.  She did not care for the trigger point injections although they did help.  She has f/u in June.  Walking seems to have been a little slower, but no falls.  Her daughter is with her today and feels that walking has been more tenuous.   No hallucinations.  No diplopia but is having blurry vision.  Does have vision appt in [facility] in few weeks.    Neuroimaging has previously been performed.   It was done on 09/13/2013 and I reviewed these films.  There was atrophy and moderate small vessel disease.  MRI brain,09/13/13  FINDINGS:  Advanced cerebral and cerebellar atrophy. Chronic microvascular  ischemic change affecting the periventricular and subcortical white  matter as well as the brainstem above moderate to advanced degree.  No acute stroke, acute hemorrhage, mass lesion, or extra-axial  fluid. Hydrocephalus ex vacuo. No features to suggest normal  pressure hydrocephalus.  Post infusion, no abnormal enhancement of the brain or meninges. No  acute or reversible cause of dementia is evident.  Pituitary, pineal, and cerebellar tonsils unremarkable. No upper  cervical lesions. Flow voids are maintained throughout the carotid,  basilar, and vertebral arteries. There are no areas of chronic  hemorrhage. Visualized calvarium, skull base, and upper cervical  osseous structures unremarkable. Scalp and extracranial soft  tissues, orbits, sinuses, and mastoids show no acute process.  Similar appearance to priors.  IMPRESSION:  Atrophy and small vessel disease. No acute intracranial findings.    PREVIOUS MEDICATIONS: Sinemet  ALLERGIES:    Allergies  Allergen Reactions  . Codeine Sulfate     REACTION: hallucinations  . Penicillins Anaphylaxis, Swelling and Rash  . Wellbutrin [Bupropion] Other (See Comments)    Seizures and hallucinations    CURRENT MEDICATIONS:  Current Outpatient Prescriptions on File Prior to Visit  Medication Sig Dispense Refill  . ALPRAZolam (XANAX) 0.5 MG tablet TAKE 1 TABLET BY MOUTH TWICE A DAY  60 tablet  0  . aspirin 81 MG tablet Take 81 mg by mouth daily.       . carbidopa-levodopa (SINEMET IR) 25-100 MG per tablet TAKE 3 TABLETS BY MOUTH 3 (THREE) TIMES DAILY.  270 tablet  5  . cetirizine (ZYRTEC) 10 MG tablet Take 10 mg by mouth daily.      Marland Kitchen  Cholecalciferol (VITAMIN D3) 1000 UNITS CAPS Take 1 capsule by mouth daily.        . feeding supplement (ENSURE COMPLETE) LIQD Take 237 mLs by mouth 2 (two) times daily between meals.  1 Bottle  11  . FLUoxetine (PROZAC) 20 MG capsule TAKE ONE CAPSULE BY MOUTH EVERY DAY  90 capsule  1  . losartan-hydrochlorothiazide (HYZAAR) 50-12.5 MG per tablet TAKE ONE BY MOUTH DAILY  90 tablet  1  . mirtazapine (REMERON) 30 MG tablet TAKE 1 TABLET BY MOUTH AT BEDTIME  30 tablet  5  . Ranitidine HCl ([facility] RANITIDINE PO) Take one by mouth daily, patient unsure of strength      . simvastatin (ZOCOR) 80 MG tablet TAKE 1 TABLET (80 MG TOTAL) BY MOUTH AT BEDTIME.  90 tablet  1  . vitamin B-12 (CYANOCOBALAMIN) 1000 MCG tablet Take 1,000 mcg by mouth daily.         No current facility-administered medications on file prior to visit.    PAST MEDICAL HISTORY:   Past Medical History  Diagnosis Date  . Dysmetabolic syndrome X   . Personal history of thrombophlebitis   . Essential hypertension, benign   . Morbid obesity   . Mixed hyperlipidemia   . Depressive disorder, not elsewhere classified   . Bell's palsy     1985  . Adjustment disorder with depressed mood   . DVT (deep venous thrombosis), left "early 2000's"    RLE  . Syncope and collapse 12/06/11    "this am; I  pass out fairly often"  . Type II or unspecified type diabetes mellitus without mention of complication, not stated as uncontrolled 12/06/11    "not anymore; had lap band OR"  . Neurodegenerative gait disorder   . PPIRJJOA(416.6) 05/2011    "pretty often since they put me on Parkinson's medicine"  . Parkinson disease     "they think that's what it is"  . DEMENTIA   . 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 Anxiety   . History of shingles     "as a teen and in my 67's"    PAST SURGICAL HISTORY:   Past Surgical History  Procedure Laterality Date  . Tonsillectomy and adenoidectomy  1970  . Bladder tack  11/1995  . Septoplasty  08/1998    with antral window   . Knee arthroscopy  12/12/2002    right   . Fracture surgery  02/09/2006    bilateral elbows  . Fracture surgery      right knee  . Fracture surgery      tib plateau  . Breast cyst aspiration  12/1998    bilaterally  . Breast biopsy  05/20/2000    bilaterally  . Tubal ligation  1970's  . Cataract extraction w/ intraocular lens  implant, bilateral    . Laparoscopic gastric banding  2008  . Cardiac catheterization  2004    SOCIAL HISTORY:   History   Social History  . Marital Status: Widowed    Spouse Name: N/A    Number of Children: 2  . Years of Education: N/A   Occupational History  . 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 Smoking status: Never Smoker   . Smokeless tobacco: Never Used  . Alcohol Use: No     Comment: none  . Drug Use: No  . Sexual Activity: No  Other Topics Concern  . Not on file   Social History Narrative   Lives with daughter and granddaughter.   Daughter is HPOA.   DNR.    FAMILY HISTORY:   Family Status  Relation Status Death Age  . Mother Deceased 46    natural causes  . Father Deceased 9    MI  . Sister Alive     DM-2  . Child Alive     2, HTN, DM-2, hyperlipidemia    ROS:  A complete 10  system review of systems was obtained and was unremarkable apart from what is mentioned above.  PHYSICAL EXAMINATION:    VITALS:   Filed Vitals:   11/27/13 0812 11/27/13 0850  BP: 140/100 112/82  Pulse: 100   Resp: 20   Height: 5\' 2"  (1.575 m)   Weight: 156 lb (70.761 kg)      GEN:  The patient appears stated age and is in NAD. HEENT:  Normocephalic, atraumatic.  The mucous membranes are moist. The superficial temporal arteries are without ropiness or tenderness. CV:  RRR Lungs:  CTAB Neck/HEME:  There are no carotid bruits bilaterally.  Neurological examination:  Orientation: The patient was alert and oriented.  MocA 04/11/2012 was 25/30 and 04/03/13 was 20/30. Cranial nerves: There is L facial droop with decreased forehead wrinkle on the L.  She has a positive stellwags sign. Pupils are equal round and reactive to light bilaterally. Fundoscopic exam reveals clear margins bilaterally. Extraocular muscles are intact but there is some upgaze paresis.   There are square wave jerks and difficulty with smooth pursuit.  The visual fields are full to confrontational testing. The speech is hypophonic but fluent and clear. She has no trouble repeating gutteral sounds.  Soft palate rises symmetrically and there is no tongue deviation. Hearing is intact to conversational tone. Sensation: Sensation is intact to light and pinprick throughout (facial, trunk, extremities). Vibration is intact at the bilateral big toe. There is no extinction with double simultaneous stimulation. There is no sensory dermatomal level identified. Motor: Strength is 5/5 in the bilateral upper and lower extremities.   Shoulder shrug is equal and symmetric.  There is no pronator drift. Deep tendon reflexes: Deep tendon reflexes are 3/4 at the bilateral biceps, triceps, brachioradialis, and achilles. There are cross adductor reflexes at the knees bilaterally.  Plantar responses are upgoing bilaterally.  Movement  examination: Tone: There is normal tone in the bilateral upper extremities today. The tone in the lower extremities is normal.  Abnormal movements: Rare tremor noted today Coordination:  There is decremation with RAM's, including heel taps bilaterally, finger taps and toe taps, most prominent on the L. Gait and Station: The patient is short stepped and mildly unstable.  She has some difficulty with turns.  LABS:  Lab Results  Component Value Date   WBC 9.6 11/22/2013   HGB 13.1 11/22/2013   HCT 40.4 11/22/2013   MCV 95.7 11/22/2013   PLT 219.0 11/22/2013     Chemistry      Component Value Date/Time   NA 141 11/22/2013 0828   K 4.3 11/22/2013 0828   CL 104 11/22/2013 0828   CO2 30 11/22/2013 0828   BUN 24* 11/22/2013 0828   CREATININE 0.6 11/22/2013 0828      Component Value Date/Time   CALCIUM 10.2 11/22/2013 0828   ALKPHOS 51 11/22/2013 0828   AST 22 11/22/2013 0828   ALT 10 11/22/2013 0828   BILITOT 0.7 11/22/2013 [id number]  Lab Results  Component Value Date   TSH 0.75 11/22/2013   Lab Results  Component Value Date   [id number]* 11/16/2012    Lab Results  Component Value Date   HGBA1C 6.0 11/22/2013   Lab Results  Component Value Date   ESRSEDRATE 18 04/03/2013    ASSESSMENT/PLAN: 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  Her primary issue is gait instability with slowness of movement.  While she does have some memory loss, this is not a prominent feature.  I believe that FTD and LBD are lower on the list of Ddx, given the relative lack of personality changes per family.  The hallucinations were likely medication induced.  -she will remain on carbidopa/levodopa 25/100, 3 tablets 3 times per day.  When this medication was attempted to be weaned in the past, walking got worse.  Risks, benefits, side effects and alternative therapies were discussed.  The opportunity to ask questions was given and they were answered to the best of my ability.  The patient expressed understanding and willingness to follow  the outlined treatment protocols.  - She was unable to go to the rehab center b/c of transportation issues.  She will be referred to [facility] for PT.  -Safety discussed.  Pt education provided. 2.  Hx of seizure.  This is likely secondary to the initiation of wellbutrin.  She should remain off of this medication.  EEG in the hospital was normal.   3.  Depression.  I agree with the addition of remeron which has helped sleep, appetite and anxiety.    -she looks better today in this regard, and feels better.  She is unwilling to go back to Dr. Cheryln Manly. 4.  Headache.  -She is doing better with the help of Dr. Domingo Cocking.  She is on low dose gabapentin. 5.  memory changes, likely related to #1.  -She thinks exelon patch made the HA worse and it has been d/c

## 2013-11-28 ENCOUNTER — Other Ambulatory Visit (INDEPENDENT_AMBULATORY_CARE_PROVIDER_SITE_OTHER): Payer: Medicare Other

## 2013-11-28 ENCOUNTER — Encounter: Payer: Self-pay | Admitting: Family Medicine

## 2013-11-28 DIAGNOSIS — Z1211 Encounter for screening for malignant neoplasm of colon: Secondary | ICD-10-CM

## 2013-11-28 LAB — FECAL OCCULT BLOOD, IMMUNOCHEMICAL: Fecal Occult Bld: NEGATIVE

## 2013-11-29 ENCOUNTER — Encounter: Payer: Self-pay | Admitting: *Deleted

## 2013-12-04 ENCOUNTER — Telehealth: Payer: Self-pay | Admitting: Neurology

## 2013-12-04 NOTE — Telephone Encounter (Signed)
Spoke with daughter and confirmed home health was for physical therapy to help with patient's walking. She will call with any other questions.

## 2013-12-04 NOTE — Telephone Encounter (Signed)
Pt called yesterday evening, has question about PT order. Please call [phone] / Sherri S.

## 2013-12-16 ENCOUNTER — Other Ambulatory Visit: Payer: Self-pay | Admitting: Family Medicine

## 2013-12-18 ENCOUNTER — Telehealth: Payer: Self-pay | Admitting: Neurology

## 2013-12-18 NOTE — Telephone Encounter (Signed)
Pt requesting medication refill. Last ov 11/2013 with no future appts scheduled. pls advise 

## 2013-12-18 NOTE — Telephone Encounter (Signed)
Lm on pts vm informing her Rx has been called in to requested pharmacy 

## 2013-12-18 NOTE — Telephone Encounter (Signed)
Spoke with patient's daughter. She states patient saw a physical therapist but he was not doing much with her and stated, per daughter, that he didn't know what he was there to help with. I gave the daughter the number to contact [facility] to request a different physical therapist if she was not happy with the services being provided, but that the order states physical therapy for gait instability. She will contact them.

## 2013-12-18 NOTE — Telephone Encounter (Signed)
Pt daughter would like to talk to someone pleas call Kennyth Lose at [phone]

## 2013-12-27 DIAGNOSIS — R262 Difficulty in walking, not elsewhere classified: Secondary | ICD-10-CM

## 2013-12-27 DIAGNOSIS — M6281 Muscle weakness (generalized): Secondary | ICD-10-CM

## 2013-12-27 DIAGNOSIS — I10 Essential (primary) hypertension: Secondary | ICD-10-CM

## 2014-01-11 ENCOUNTER — Encounter: Payer: Self-pay | Admitting: Internal Medicine

## 2014-01-11 ENCOUNTER — Ambulatory Visit (INDEPENDENT_AMBULATORY_CARE_PROVIDER_SITE_OTHER): Payer: Medicare Other | Admitting: Internal Medicine

## 2014-01-11 VITALS — BP 118/74 | HR 78 | Temp 98.0°F | Wt 154.0 lb

## 2014-01-11 DIAGNOSIS — J3489 Other specified disorders of nose and nasal sinuses: Secondary | ICD-10-CM

## 2014-01-11 DIAGNOSIS — R0982 Postnasal drip: Secondary | ICD-10-CM

## 2014-01-11 DIAGNOSIS — R8290 Unspecified abnormal findings in urine: Secondary | ICD-10-CM

## 2014-01-11 DIAGNOSIS — R0981 Nasal congestion: Secondary | ICD-10-CM

## 2014-01-11 DIAGNOSIS — R82998 Other abnormal findings in urine: Secondary | ICD-10-CM

## 2014-01-11 DIAGNOSIS — R3915 Urgency of urination: Secondary | ICD-10-CM

## 2014-01-11 MED ORDER — CIPROFLOXACIN HCL 500 MG PO TABS: 500.0000 mg | ORAL_TABLET | Freq: Two times a day (BID) | ORAL | Status: DC

## 2014-01-11 NOTE — Progress Notes (Signed)
Subjective:    Patient ID: [name], female    DOB: [birth year], 68 y.o.   MRN: [id number]  HPI  Pt presents to the clinic today with c/o urgency, dark urine and odor. She reports this started 2 days ago. She denies dysuria, fever, chills or low back pain. She does drink 6-8 glasses of water per day. She has not tried anything OTC.  Additionally, she reports nasal congestion and post nasal drip. This started 1 week ago. She reports she is blowing clear mucous out of her nose. She denies fever, chills or body aches.  Review of Systems      Past Medical History  Diagnosis Date  . Dysmetabolic syndrome X   . Personal history of thrombophlebitis   . Essential hypertension, benign   . Morbid obesity   . Mixed hyperlipidemia   . Depressive disorder, not elsewhere classified   . Bell's palsy     1985  . Adjustment disorder with depressed mood   . DVT (deep venous thrombosis), left "early 2000's"    RLE  . Syncope and collapse 12/06/11    "this am; I pass out fairly often"  . Type II or unspecified type diabetes mellitus without mention of complication, not stated as uncontrolled 12/06/11    "not anymore; had lap band OR"  . Neurodegenerative gait disorder   . PZWCHENI(778.2) 05/2011    "pretty often since they put me on Parkinson's medicine"  . Parkinson disease     "they think that's what it is"  . DEMENTIA   . 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 Anxiety   . History of shingles     "as a teen and in my 26's"    Current Outpatient Prescriptions  Medication Sig Dispense Refill  . ALPRAZolam (XANAX) 0.5 MG tablet TAKE 1 TABLET BY MOUTH TWICE A DAY  60 tablet  0  . aspirin 81 MG tablet Take 81 mg by mouth daily.       Marland Kitchen Bioflavonoid Products (GRAPE SEED PO) Take by mouth daily.      . Calcium Carbonate-Vitamin D (CALCIUM + D PO) Take 600 mg by mouth daily.      . carbidopa-levodopa (SINEMET IR) 25-100 MG per tablet TAKE 3 TABLETS BY  MOUTH 3 (THREE) TIMES DAILY.  270 tablet  5  . cetirizine (ZYRTEC) 10 MG tablet Take 10 mg by mouth daily.      . Cholecalciferol (VITAMIN D3) 1000 UNITS CAPS Take 1 capsule by mouth daily.        . feeding supplement (ENSURE COMPLETE) LIQD Take 237 mLs by mouth 2 (two) times daily between meals.  1 Bottle  11  . FLUoxetine (PROZAC) 20 MG capsule TAKE ONE CAPSULE BY MOUTH EVERY DAY  90 capsule  1  . gabapentin (NEURONTIN) 100 MG capsule       . losartan-hydrochlorothiazide (HYZAAR) 50-12.5 MG per tablet TAKE ONE BY MOUTH DAILY  90 tablet  0  . mirtazapine (REMERON) 30 MG tablet TAKE 1 TABLET BY MOUTH AT BEDTIME  30 tablet  5  . Ranitidine HCl ([facility]S RANITIDINE PO) Take one by mouth daily, patient unsure of strength      . simvastatin (ZOCOR) 80 MG tablet TAKE 1 TABLET (80 MG TOTAL) BY MOUTH AT BEDTIME.  90 tablet  1  . vitamin B-12 (CYANOCOBALAMIN) 1000 MCG tablet Take 1,000 mcg by mouth daily.  No current facility-administered medications for this visit.    Allergies  Allergen Reactions  . Codeine Sulfate     REACTION: hallucinations  . Penicillins Anaphylaxis, Swelling and Rash  . Wellbutrin [Bupropion] Other (See Comments)    Seizures and hallucinations    Family History  Problem Relation Age of Onset  . COPD Father   . Diabetes Sister   . Vision loss Sister     legally blind  . Asthma Mother   . Heart disease Father     History   Social History  . Marital Status: Widowed    Spouse Name: N/A    Number of Children: 2  . Years of Education: N/A   Occupational History  . 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 Smoking status: Never Smoker   . Smokeless tobacco: Never Used  . Alcohol Use: No     Comment: none  . Drug Use: No  . Sexual Activity: No   Other Topics Concern  . Not on file   Social History Narrative   Lives with daughter and granddaughter.   Daughter is HPOA.   DNR.     Constitutional: Denies  fever, malaise, fatigue, headache or abrupt weight changes.  HEENT: Pt reports nasal congestion. Denies eye pain, eye redness, ear pain, ringing in the ears, wax buildup, runny nose, bloody nose, or sore throat. GU: Pt reports urgency and odor. Denies frequency, pain with urination, burning sensation, blood in urine or discharge.    No other specific complaints in a complete review of systems (except as listed in HPI above).  Objective:   Physical Exam   BP 118/74  Pulse 78  Temp(Src) 98 F (36.7 C) (Oral)  Wt 154 lb (69.854 kg)  SpO2 98% Wt Readings from Last 3 Encounters:  01/11/14 154 lb (69.854 kg)  11/27/13 156 lb (70.761 kg)  11/26/13 153 lb 12 oz (69.741 kg)    General: Appears her stated age, well developed, well nourished in NAD. HEENT: Head: normal shape and size, frontal sinus tenderness; Eyes: sclera white, no icterus, conjunctiva pink, PERRLA and EOMs intact; Ears: Tm's gray and intact, normal light reflex; Nose: mucosa pink and moist, septum midline; Throat/Mouth: Teeth present, mucosa pink and moist, + PND, no exudate, lesions or ulcerations noted.  Cardiovascular: Normal rate and rhythm. S1,S2 noted.  No murmur, rubs or gallops noted. No JVD or BLE edema. No carotid bruits noted. Pulmonary/Chest: Normal effort and positive vesicular breath sounds. No respiratory distress. No wheezes, rales or ronchi noted.  Abdomen: Soft and nontender. Normal bowel sounds, no bruits noted. No distention or masses noted. Liver, spleen and kidneys non palpable. Slightly tender with palpation over the bladder. No CVA tenderness.   BMET    Component Value Date/Time   NA 141 11/22/2013 0828   K 4.3 11/22/2013 0828   CL 104 11/22/2013 0828   CO2 30 11/22/2013 0828   GLUCOSE 87 11/22/2013 0828   BUN 24* 11/22/2013 0828   CREATININE 0.6 11/22/2013 0828   CALCIUM 10.2 11/22/2013 0828   GFRNONAA >90 06/21/2012 1944   GFRAA >90 06/21/2012 1944    Lipid Panel     Component Value Date/Time     CHOL 153 11/22/2013 0828   TRIG 111.0 11/22/2013 0828   HDL 73.40 11/22/2013 0828   CHOLHDL 2 11/22/2013 0828   VLDL 22.2 11/22/2013 0828   LDLCALC 57 11/22/2013 0828    CBC  Component Value Date/Time   WBC 9.6 11/22/2013 0828   RBC 4.23 11/22/2013 0828   HGB 13.1 11/22/2013 0828   HCT 40.4 11/22/2013 0828   PLT 219.0 11/22/2013 0828   MCV 95.7 11/22/2013 0828   MCH 30.8 06/21/2012 1944   MCHC 32.4 11/22/2013 0828   RDW 13.9 11/22/2013 0828   LYMPHSABS 3.0 11/22/2013 0828   MONOABS 0.7 11/22/2013 0828   EOSABS 0.2 11/22/2013 0828   BASOSABS 0.0 11/22/2013 0828    Hgb A1C Lab Results  Component Value Date   HGBA1C 6.0 11/22/2013        Assessment & Plan:   Urinary urgency and odor:  Urinalysis: moderate leuks, small bilirubin Will send urine culture to confirm eRx for Cipro 500 mg BID x 5 days  Nasal congestions and PND secondary to allergies:  Already on zyrtec daily Add flonase  RTC as needed or if symptoms persist or worsen

## 2014-01-11 NOTE — Patient Instructions (Addendum)

## 2014-01-11 NOTE — Progress Notes (Signed)
Pre visit review using our clinic review tool, if applicable. No additional management support is needed unless otherwise documented below in the visit note. 

## 2014-01-13 ENCOUNTER — Other Ambulatory Visit: Payer: Self-pay | Admitting: Family Medicine

## 2014-01-14 LAB — POCT URINALYSIS DIPSTICK
Blood, UA: NEGATIVE
Glucose, UA: NEGATIVE
Ketones, UA: NEGATIVE
Nitrite, UA: NEGATIVE
Protein, UA: NEGATIVE
Spec Grav, UA: 1.015
Urobilinogen, UA: 0.2
pH, UA: 6.5

## 2014-01-14 NOTE — Addendum Note (Signed)
Addended by: Lurlean Nanny on: 01/14/2014 11:52 AM   Modules accepted: Orders

## 2014-01-14 NOTE — Telephone Encounter (Signed)
Lm on pts vm informing her Rx has been faxed to requested pharmacy.  

## 2014-01-14 NOTE — Telephone Encounter (Signed)
Pt had a cpx on 11/26/13, Rx last written on 12/18/13.

## 2014-01-16 ENCOUNTER — Other Ambulatory Visit: Payer: Self-pay

## 2014-01-16 DIAGNOSIS — Z1231 Encounter for screening mammogram for malignant neoplasm of breast: Secondary | ICD-10-CM

## 2014-02-06 ENCOUNTER — Ambulatory Visit
Admission: RE | Admit: 2014-02-06 | Discharge: 2014-02-06 | Disposition: A | Discharge status: Home / Self Care | Source: Ambulatory Visit

## 2014-02-06 ENCOUNTER — Other Ambulatory Visit: Payer: Self-pay | Admitting: Family Medicine

## 2014-02-06 ENCOUNTER — Other Ambulatory Visit: Payer: Self-pay | Admitting: Internal Medicine

## 2014-02-06 DIAGNOSIS — R928 Other abnormal and inconclusive findings on diagnostic imaging of breast: Secondary | ICD-10-CM

## 2014-02-06 DIAGNOSIS — Z1231 Encounter for screening mammogram for malignant neoplasm of breast: Secondary | ICD-10-CM

## 2014-02-08 ENCOUNTER — Telehealth: Payer: Self-pay | Admitting: Family Medicine

## 2014-02-08 NOTE — Telephone Encounter (Signed)
Spoke to pt and confirmed additional views have been scheduled

## 2014-02-08 NOTE — Telephone Encounter (Signed)
Patient returned your call.

## 2014-02-09 ENCOUNTER — Other Ambulatory Visit: Payer: Self-pay | Admitting: Internal Medicine

## 2014-02-09 ENCOUNTER — Other Ambulatory Visit: Payer: Self-pay | Admitting: Family Medicine

## 2014-02-12 ENCOUNTER — Other Ambulatory Visit: Payer: Self-pay | Admitting: Family Medicine

## 2014-02-13 NOTE — Telephone Encounter (Signed)
Pt requesting medication refill. Last f/u appt 11/2013-CPE with no future appts scheduled. pls advise

## 2014-02-13 NOTE — Telephone Encounter (Signed)
Lm on pts vm informing her Rx has been faxed to requested pharmacy.  

## 2014-02-14 ENCOUNTER — Other Ambulatory Visit: Payer: Self-pay | Admitting: Family Medicine

## 2014-02-14 ENCOUNTER — Ambulatory Visit
Admission: RE | Admit: 2014-02-14 | Discharge: 2014-02-14 | Disposition: A | Discharge status: Home / Self Care | Payer: Medicare Other | Source: Ambulatory Visit | Attending: Family Medicine | Admitting: Family Medicine

## 2014-02-14 DIAGNOSIS — R928 Other abnormal and inconclusive findings on diagnostic imaging of breast: Secondary | ICD-10-CM

## 2014-02-18 ENCOUNTER — Other Ambulatory Visit: Payer: Self-pay | Admitting: Family Medicine

## 2014-02-18 NOTE — Telephone Encounter (Signed)
Lm on pts vm informing her Rx has been called in to requested pharmacy 

## 2014-02-18 NOTE — Telephone Encounter (Signed)
Pt requesting medication refill. Last f/u appt 11/2013-CPE with no future appts scheduled. pls advise

## 2014-02-18 NOTE — Telephone Encounter (Signed)
Pt left v/m requesting refill alprazolam to [facility] [address].Please advise.

## 2014-03-07 ENCOUNTER — Encounter: Payer: Self-pay | Admitting: Internal Medicine

## 2014-03-07 ENCOUNTER — Ambulatory Visit (INDEPENDENT_AMBULATORY_CARE_PROVIDER_SITE_OTHER): Payer: Medicare Other | Admitting: Internal Medicine

## 2014-03-07 ENCOUNTER — Other Ambulatory Visit: Payer: Self-pay | Admitting: Internal Medicine

## 2014-03-07 VITALS — BP 134/78 | HR 88 | Temp 98.2°F | Wt 157.0 lb

## 2014-03-07 DIAGNOSIS — R82998 Other abnormal findings in urine: Secondary | ICD-10-CM

## 2014-03-07 DIAGNOSIS — R5383 Other fatigue: Secondary | ICD-10-CM

## 2014-03-07 DIAGNOSIS — R6883 Chills (without fever): Secondary | ICD-10-CM

## 2014-03-07 DIAGNOSIS — R8290 Unspecified abnormal findings in urine: Secondary | ICD-10-CM

## 2014-03-07 DIAGNOSIS — R5381 Other malaise: Secondary | ICD-10-CM

## 2014-03-07 DIAGNOSIS — R531 Weakness: Secondary | ICD-10-CM

## 2014-03-07 LAB — COMPREHENSIVE METABOLIC PANEL
ALT: 6 U/L (ref 0–35)
AST: 22 U/L (ref 0–37)
Albumin: 3.6 g/dL (ref 3.5–5.2)
Alkaline Phosphatase: 52 U/L (ref 39–117)
BUN: 18 mg/dL (ref 6–23)
CO2: 29 mEq/L (ref 19–32)
Calcium: 10.3 mg/dL (ref 8.4–10.5)
Chloride: 102 mEq/L (ref 96–112)
Creatinine, Ser: 0.7 mg/dL (ref 0.4–1.2)
GFR: 82.66 mL/min (ref >60.00)
Glucose, Bld: 149 mg/dL — ABNORMAL HIGH (ref 70–99)
Potassium: 3.5 mEq/L (ref 3.5–5.1)
Sodium: 139 mEq/L (ref 135–145)
Total Bilirubin: 0.4 mg/dL (ref 0.2–1.2)
Total Protein: 6.3 g/dL (ref 6.0–8.3)

## 2014-03-07 LAB — CBC
HCT: 39.1 % (ref 36.0–46.0)
Hemoglobin: 13.1 g/dL (ref 12.0–15.0)
MCHC: 33.5 g/dL (ref 30.0–36.0)
MCV: 93.7 fl (ref 78.0–100.0)
Platelets: 224 10*3/uL (ref 150.0–400.0)
RBC: 4.18 Mil/uL (ref 3.87–5.11)
RDW: 14.1 % (ref 11.5–15.5)
WBC: 15 10*3/uL — ABNORMAL HIGH (ref 4.0–10.5)

## 2014-03-07 LAB — POCT URINALYSIS DIPSTICK
Bilirubin, UA: NEGATIVE
Blood, UA: NEGATIVE
Glucose, UA: NEGATIVE
Nitrite, UA: NEGATIVE
Spec Grav, UA: >=1.03
Urobilinogen, UA: NEGATIVE
pH, UA: 6

## 2014-03-07 MED ORDER — LEVOFLOXACIN 500 MG PO TABS: 500.0000 mg | ORAL_TABLET | Freq: Every day | ORAL | Status: DC

## 2014-03-07 NOTE — Progress Notes (Signed)
Pre visit review using our clinic review tool, if applicable. No additional management support is needed unless otherwise documented below in the visit note. 

## 2014-03-07 NOTE — Patient Instructions (Signed)

## 2014-03-07 NOTE — Addendum Note (Signed)
Addended by: Lurlean Nanny on: 03/07/2014 11:20 AM   Modules accepted: Orders

## 2014-03-07 NOTE — Progress Notes (Signed)
HPI  Pt presents to the clinic today with c/o strong odor to her urine. This started 5 days ago. She also c/o chills, dizziness, fatigue and weakness in her legs. She denies fever, cough, sore throat, runny nose, nausea, vomiting or diarrhea. She has tried tylenol with out any relief. She has not had sick contacts that she is aware of. She reports that she has only felt like this once before and they found out that she was dehydrated.   Review of Systems  Past Medical History  Diagnosis Date  . Dysmetabolic syndrome X   . Personal history of thrombophlebitis   . Essential hypertension, benign   . Morbid obesity   . Mixed hyperlipidemia   . Depressive disorder, not elsewhere classified   . Bell's palsy     1985  . Adjustment disorder with depressed mood   . DVT (deep venous thrombosis), left "early 2000's"    RLE  . Syncope and collapse 12/06/11    "this am; I pass out fairly often"  . Type II or unspecified type diabetes mellitus without mention of complication, not stated as uncontrolled 12/06/11    "not anymore; had lap band OR"  . Neurodegenerative gait disorder   . SEGBTDVV(616.0) 05/2011    "pretty often since they put me on Parkinson's medicine"  . Parkinson disease     "they think that's what it is"  . DEMENTIA   . 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 Anxiety   . History of shingles     "as a teen and in my 76's"    Family History  Problem Relation Age of Onset  . COPD Father   . Diabetes Sister   . Vision loss Sister     legally blind  . Asthma Mother   . Heart disease Father     History   Social History  . Marital Status: Widowed    Spouse Name: N/A    Number of Children: 2  . Years of Education: N/A   Occupational History  . 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 Smoking status: Never Smoker   . Smokeless tobacco: Never Used  . Alcohol Use: No     Comment: none  . Drug  Use: No  . Sexual Activity: No   Other Topics Concern  . Not on file   Social History Narrative   Lives with daughter and granddaughter.   Daughter is HPOA.   DNR.    Allergies  Allergen Reactions  . Codeine Sulfate     REACTION: hallucinations  . Penicillins Anaphylaxis, Swelling and Rash  . Wellbutrin [Bupropion] Other (See Comments)    Seizures and hallucinations    Constitutional: Pt reports fatigue. Denies fever, malaise, headache or abrupt weight changes.   GU: Pt reports strong odor to urine. Denies burning sensation, blood in urine, urgency, frequency or discharge. MSK: Pt reports generalized weakness. Denies joint pain or swelling, difficulty with gait or muscle pain.  No other specific complaints in a complete review of systems (except as listed in HPI above).    Objective:   Physical Exam  BP 134/78  Pulse 88  Temp(Src) 98.2 F (36.8 C) (Oral)  Wt 157 lb (71.215 kg)  SpO2 98%  Wt Readings from Last 3 Encounters:  01/11/14 154 lb (69.854 kg)  11/27/13 156 lb (70.761 kg)  11/26/13 153 lb 12 oz (  69.741 kg)    General: Appears her stated age, well developed, well nourished in NAD. HEENT: Ears: No pain with palpation, TM gray and intact, + effusion on the left. Throat: pink, moist mucous membranes. + cervical adenopathy on the right. Cardiovascular: Normal rate and rhythm. S1,S2 noted.  No murmur, rubs or gallops noted. No JVD or BLE edema. No carotid bruits noted. Pulmonary/Chest: Normal effort and positive vesicular breath sounds. No respiratory distress. No wheezes, rales or ronchi noted.  Abdomen: Soft and nontender. Normal bowel sounds, no bruits noted. No distention or masses noted. Liver, spleen and kidneys non palpable. No CVA tenderness. MSK: Equal strength in bilateral upper and lower extremities     Assessment & Plan:   Fatigue, chills, weakness and strong odor to urine:  Urinalysis: 2+ leuks Will send culture to look for infection Will check  CBC and CMET Exam otherwise normal- will hold off on abx at this time Drink plenty of fluids Rest until we get the lab results back  RTC as needed or if symptoms persist.

## 2014-03-09 LAB — URINE CULTURE: Colony Count: >=100000

## 2014-03-17 ENCOUNTER — Other Ambulatory Visit: Payer: Self-pay | Admitting: Family Medicine

## 2014-03-18 ENCOUNTER — Other Ambulatory Visit: Payer: Self-pay | Admitting: Family Medicine

## 2014-03-18 IMAGING — RF DG ESOPHAGUS
14 of 19 series · 14 of 24 positions shown · non-contrast
Comparison: Upper GI dated 07/29/2006

CLINICAL DATA: Reflux.  Dysphagia.  Prior lap band surgery.

ESOPHOGRAM/BARIUM SWALLOW
TECHNIQUE: Single contrast examination was performed using thin
barium.
Fluoroscopy time:  1.2 minutes slow pulsed fluoroscopy.

[Series 1: run · 1 of 9 slices shown (1 of 14)]
[im 1/9]
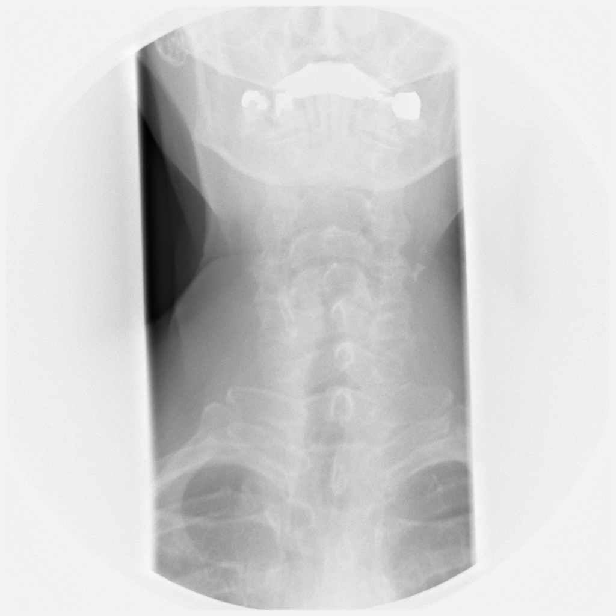

[Series 2: run · 1 of 8 slices shown (2 of 14)]
[im 8/8]
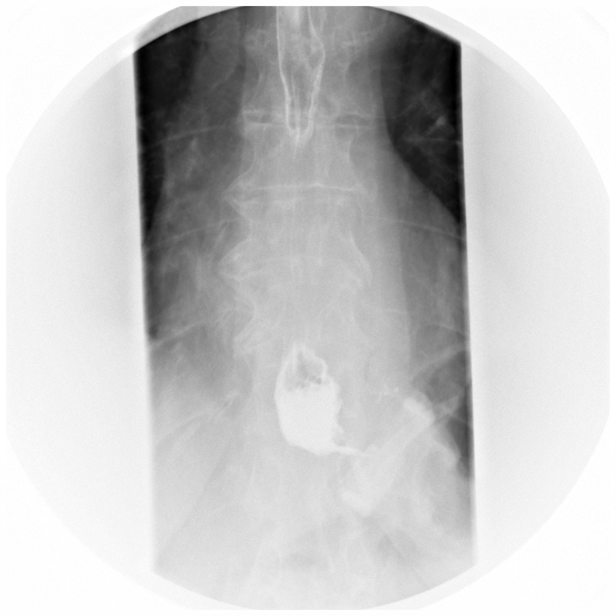

[Series 4: run · 1 of 5 slices shown (3 of 14)]
[im 1/5]
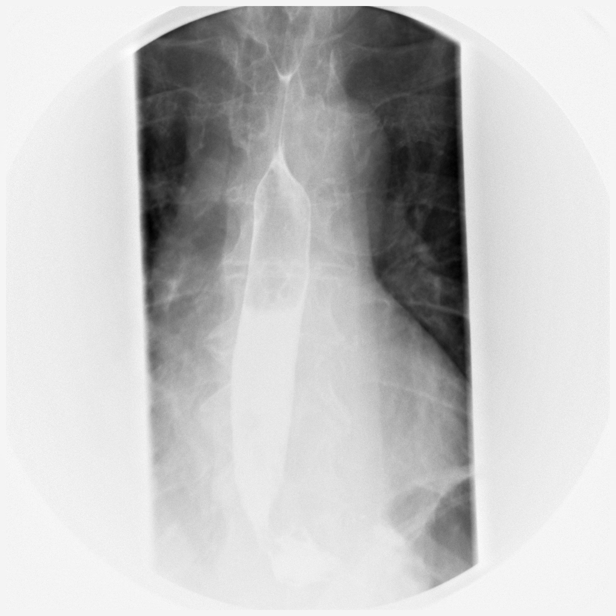

[Series 5: run · 1 of 8 slices shown (4 of 14)]
[im 8/8]
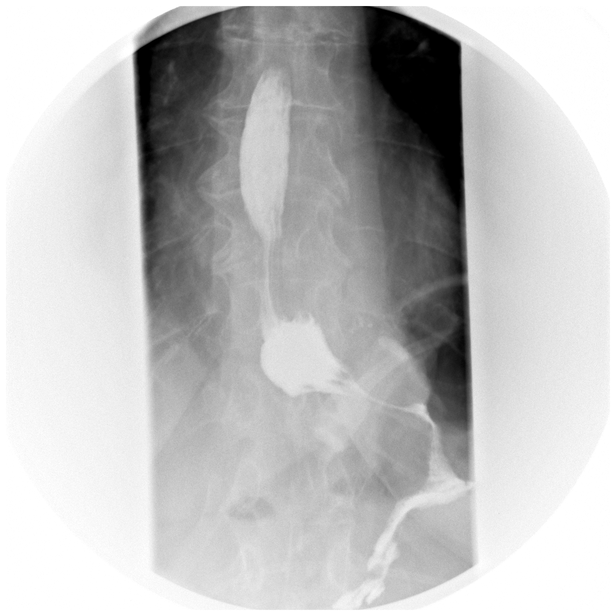

[Series 6: run · 1 of 1 slices shown (5 of 14)]
[im 1/1]
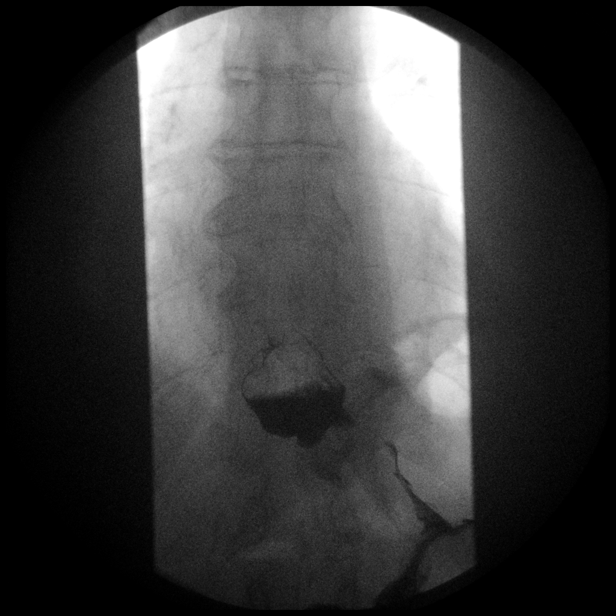

[Series 8: run · 1 of 1 slices shown (6 of 14)]
[im 1/1]
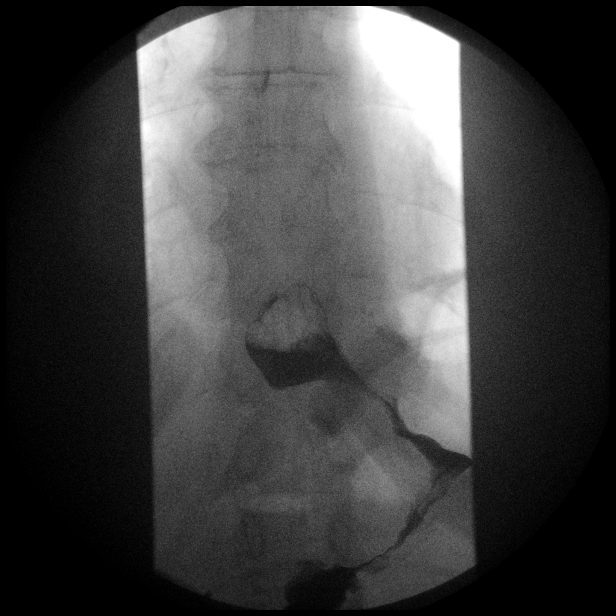

[Series 10: run · 1 of 1 slices shown (7 of 14)]
[im 1/1]
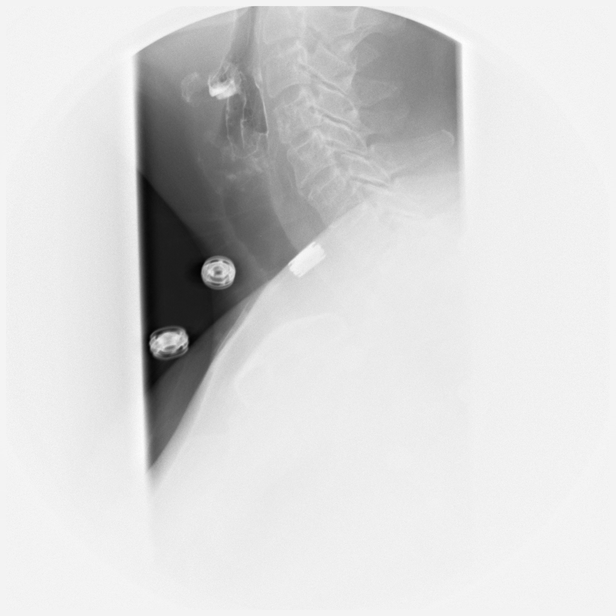

[Series 11: run · 1 of 6 slices shown (8 of 14)]
[im 1/6]
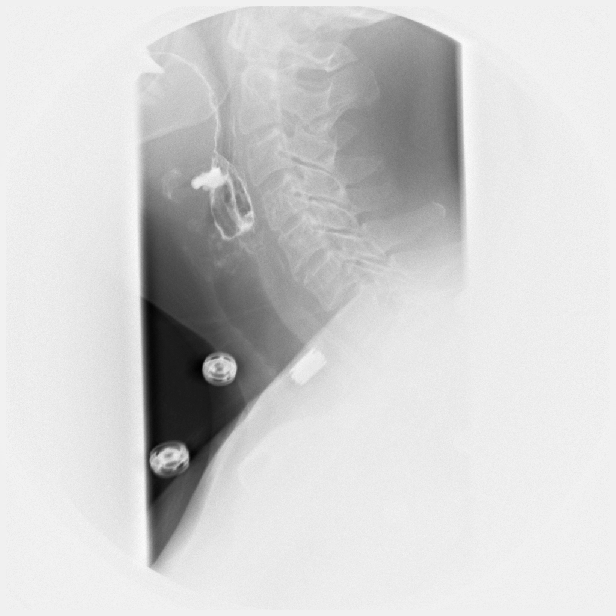

[Series 12: run · 1 of 11 slices shown (9 of 14)]
[im 6/11]
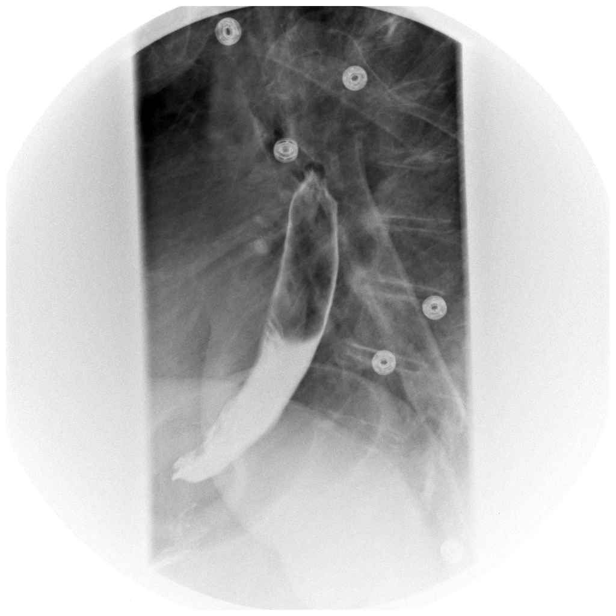

[Series 13: run · 1 of 10 slices shown (10 of 14)]
[im 1/10]
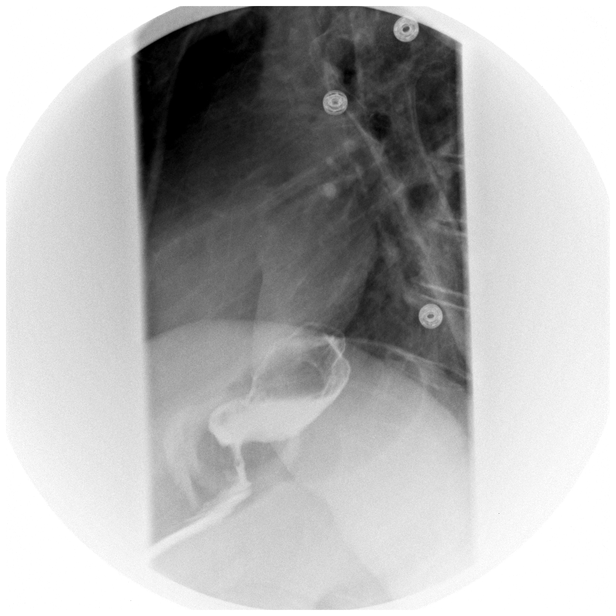

[Series 14: run · 1 of 1 slices shown (11 of 14)]
[im 1/1]
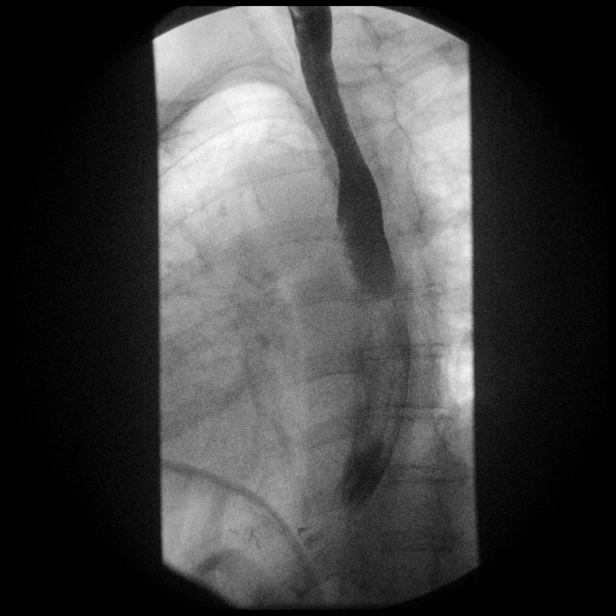

[Series 15: run · 1 of 1 slices shown (12 of 14)]
[im 1/1]
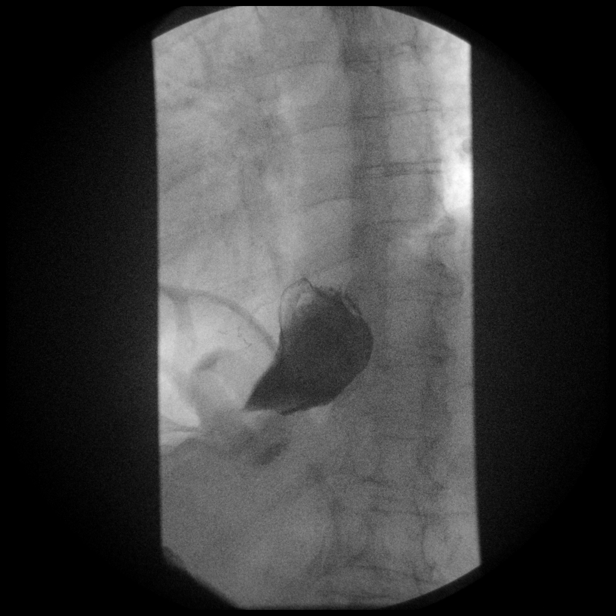

[Series 17: run · 1 of 1 slices shown (13 of 14)]
[im 1/1]
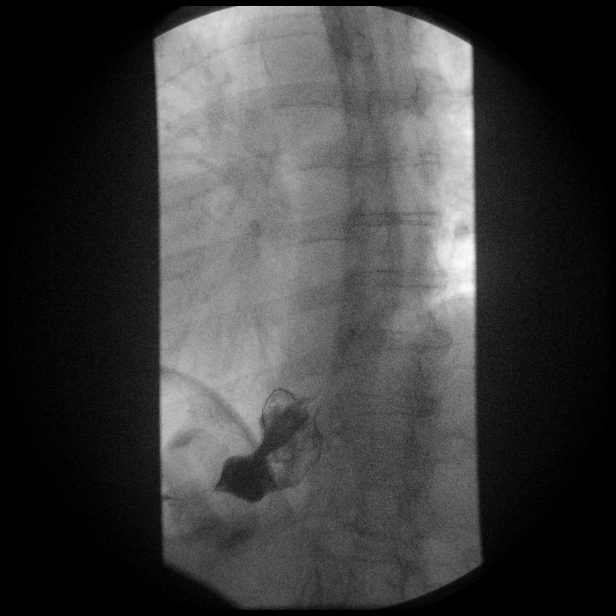

[Series 19: run · 1 of 1 slices shown (14 of 14)]
[im 1/1]
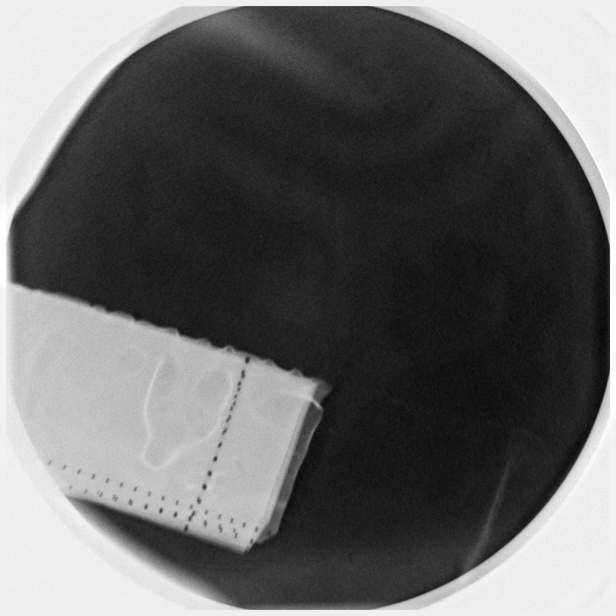

[14 of 24 positions shown; findings below may reference images not displayed]

FINDINGS: The oropharyngeal swallowing mechanisms are normal.
Valleculae and piriform sinuses are bilaterally symmetrical and
normal.  The mucosa of the esophagus is normal.  The patient does
have intermittent reversed peristalsis in the distal esophagus.
The thin barium does pass through the lap band narrowing into the
stomach with minimal delay.  The lap band appears normally
oriented.

No strictures.
IMPRESSION: Esophageal motility disorder with intermittent reversed peristalsis
in the distal half of the esophagus.  This then relaxes and the
contrast passes through the lap band into the stomach with no
significant delay.

## 2014-03-18 NOTE — Telephone Encounter (Signed)
Pt requesting medication refill. Last f/u appt 11/2013-CPE with no future appts scheduled. pls advise

## 2014-03-18 NOTE — Telephone Encounter (Signed)
Rx faxed to requested pharmacy 

## 2014-04-02 ENCOUNTER — Telehealth: Payer: Self-pay | Admitting: Family Medicine

## 2014-04-02 ENCOUNTER — Encounter: Payer: Self-pay | Admitting: *Deleted

## 2014-04-02 ENCOUNTER — Ambulatory Visit: Payer: Medicare Other | Admitting: Neurology

## 2014-04-02 NOTE — Telephone Encounter (Signed)
Pt daughter Geni Bers states mother is having a possible root canal procedure with in the next two weeks. She would like to know when her mom needs to stop taking her regular does of baby asprin before the procedure? Please advise and call pt daughter back on cell phone.

## 2014-04-02 NOTE — Progress Notes (Signed)
No show letter sent for 04-02-14 

## 2014-04-02 NOTE — Telephone Encounter (Signed)
I would speak with her dentist to be sure as I am not sure of extensiveness of dental work but typically you should  stop taking ASA 7-10 days prior.

## 2014-04-02 NOTE — Telephone Encounter (Signed)
Geni Bers notified as instructed by telephone.

## 2014-04-03 ENCOUNTER — Telehealth: Payer: Self-pay | Admitting: Neurology

## 2014-04-03 NOTE — Telephone Encounter (Signed)
Pt no showed 04/02/14 appt w/ Dr. Carles Collet. No show letter mailed to pt / Sherri S.

## 2014-04-10 ENCOUNTER — Other Ambulatory Visit: Payer: Self-pay | Admitting: Neurology

## 2014-04-11 ENCOUNTER — Other Ambulatory Visit: Payer: Self-pay | Admitting: Neurology

## 2014-04-11 ENCOUNTER — Other Ambulatory Visit: Payer: Self-pay | Admitting: Family Medicine

## 2014-04-11 NOTE — Telephone Encounter (Signed)
Okay to fill medication? Not mentioned in last note.

## 2014-04-12 NOTE — Telephone Encounter (Signed)
Pt requesting medication refill. Last f/u appt 11/2013-CPE. Ok to fill per Dr Deborra Medina. Rx to be faxed to requested pharmacy before end of day.

## 2014-04-29 ENCOUNTER — Other Ambulatory Visit: Payer: Self-pay | Admitting: Neurology

## 2014-04-29 MED ORDER — CARBIDOPA-LEVODOPA 25-100 MG PO TABS: 3.0000 | ORAL_TABLET | Freq: Three times a day (TID) | ORAL | Status: DC

## 2014-04-29 NOTE — Telephone Encounter (Signed)
Carbidopa Levodopa refill requested. Per last office note- patient to remain on medication. Refill approved and sent to patient's pharmacy for a month supply. Patient no showed last appt. Appt scheduled 05/07/2014 and patient must keep appt for additional refills.

## 2014-05-07 ENCOUNTER — Ambulatory Visit (INDEPENDENT_AMBULATORY_CARE_PROVIDER_SITE_OTHER): Payer: Medicare Other | Admitting: Neurology

## 2014-05-07 ENCOUNTER — Encounter: Payer: Self-pay | Admitting: Neurology

## 2014-05-07 VITALS — BP 110/78 | HR 76 | Ht 61.0 in | Wt 158.0 lb

## 2014-05-07 DIAGNOSIS — G44229 Chronic tension-type headache, not intractable: Secondary | ICD-10-CM

## 2014-05-07 DIAGNOSIS — G214 Vascular parkinsonism: Secondary | ICD-10-CM

## 2014-05-07 DIAGNOSIS — F330 Major depressive disorder, recurrent, mild: Secondary | ICD-10-CM

## 2014-05-07 NOTE — Progress Notes (Signed)
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  This patient is accompanied in the office by her child who supplements the history.      She is on carbidopa/levodopa 25/100, 3 tablets 3 times per day.  She is doing well on this medication, without side effects.  She had one fall the day before Thanksgiving.  She was in a parking lot of a restaurant and tripped over something(curb).  She fx her nose and had to have her lip stitched up.    Since then, however, she has done very well.  Her daughter reports that she has been very stable on her feet.  Last visit, she was complaining of a decreased appetite and increased anxiety.  Therefore, I increased her Remeron to 30 mg at night.  This helped with both sleep and appetite and she is very pleased.  She also has a hx of seizure, likely secondary to Wellbutrin.  Last visit, her keppra was d/c.  She has been seizure free since that time.  However, mood and depression continue to be an issue.  I sent her to [name] but she didn't follow up.  She states that she cannot talk to him without her daughter present.  Her biggest issue is that her granddaughter is a Ship broker and she lives with the patient.  Apparently, the majority of the house is clean and safe with the exception of the granddaughters room.  The patient states that the mess just drives her crazy and she cannot stop thinking about it.  Her granddaughter goes to community college and her leaving the house is not an option.  04/03/13 update:  [name].  She was supposed to f/u with Dr Cheryln [name] due to significant anxiety and depression and she cancelled her 3/4, 3/13 and 3/27 visits and never r/s.  She has not seen him since 10/13.  She states that she is just "not going to talk with someone."  She, however, is doing some better in that regard.  Much of her stress surrounds her granddaughter, who lives with  her and is a Ship broker.  The patient states that her granddaughter has now admitted to this, but to seek help.  They were able to get some of the room cleaned up a while ago and that seemed to really help the patient's mental state.  However, the room has reconnected and is now just as it previously was.  She also reports a significant amount of stress over her sister who has end-stage Alzheimer's dementia.  Her sister no longer recognizes her, and the patient realizes when she goes around her sister, it makes her symptoms much worse.  She feels that her memory is getting worse, but nothing like her sisters.  She also thinks that her speech is getting worse.    She is having some headaches, similar to what she had in the past, prior to starting on the Levodopa.  It is over the L forehead, but not in the eye.  It is pounding.  It is daily.  It does not cause n/v or cause photophobia or phonophobia.  It makes her feel "drained."  It does not prevent her from doing things.  There are no lateralizing paresthesias with the headaches.  There are no vision changes.  07/30/13 update:  The pt is on carbidopa/levodopa 25/100 three tablets three times per day.  She is doing well in that  regard.  She did not go to PT because of transportation issues.  She was started on zanaflex last visit for HA and was also started on the exelon patch.  Interestingly, she states that the exelon patch caused headaches but it was started after the headache.  Nonetheless, she stopped it and still has headache.  It starts in the L forehead and radiates over the L side of the head.  She still thinks that the patch made them worse however and also thinks that it caused UTI's.  She is still on zanaflex but isn't sure its helping.  No falls/hallucination/syncope/lightheadedness.  11/27/13 update:  Patient is following up today in the movement disorder clinic.  She is currently on carbidopa/levodopa 25/100, 3 tablets by mouth 3 times per day.  Since our  last visit, I did get a note from [name] at the headache clinic.  He started the patient on Indocin 25 mg 3 times a day and she was told to titrate to 75 mg 3 times a day.  She states that she had SE with that and she was changed to gabapentin 100 mg and that is helping.  He also scheduled her for trigger point injections and an MRI brain.  She did not care for the trigger point injections although they did help.  She has f/u in June.  Walking seems to have been a little slower, but no falls.  Her daughter is with her today and feels that walking has been more tenuous.   No hallucinations.  No diplopia but is having blurry vision.  Does have vision appt in [facility] in few weeks.  05/07/14 update:   Patient is following up today in the movement disorder clinic.  She is currently on carbidopa/levodopa 25/100, 3 tablets by mouth 3 times per day.  Daughter states that she often forgets middle of the day dose but pt denies that.  She takes it at 9-10 am on awakening, 3 pm and bedtime.  She and her daughter both think that it is helpful.   No diplopia.  No falls.  She saw [name] in June and I got a note from him.  She had more trigger point injections.  She is doing really well from a headache standpoint.  She doesn't have to return to him for 6 months (saw him in September).  She continues on remeron for her depression and mood.  She states that she hates that she is confined to the home and depression is tied into that.  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  She then asks if her daughter can come back to the room for the discussion and she did.   She did do therapy with Abanda and she thought that it went well.   Neuroimaging has previously been performed.   It was done on 09/13/2013 and I reviewed these films.  There was atrophy and moderate small vessel disease.  MRI brain,09/13/13  FINDINGS:   Advanced cerebral and cerebellar atrophy. Chronic microvascular  ischemic change affecting the periventricular and subcortical white  matter as well as the brainstem above moderate to advanced degree.  No acute stroke, acute hemorrhage, mass lesion, or extra-axial  fluid. Hydrocephalus ex vacuo. No features to suggest normal  pressure hydrocephalus.  Post infusion, no abnormal enhancement of the brain or meninges. No  acute or reversible cause of dementia is evident.  Pituitary,  pineal, and cerebellar tonsils unremarkable. No upper  cervical lesions. Flow voids are maintained throughout the carotid,  basilar, and vertebral arteries. There are no areas of chronic  hemorrhage. Visualized calvarium, skull base, and upper cervical  osseous structures unremarkable. Scalp and extracranial soft  tissues, orbits, sinuses, and mastoids show no acute process.  Similar appearance to priors.  IMPRESSION:  Atrophy and small vessel disease. No acute intracranial findings.    PREVIOUS MEDICATIONS: Sinemet  ALLERGIES:   Allergies  Allergen Reactions  . Codeine Sulfate     REACTION: hallucinations  . Penicillins Anaphylaxis, Swelling and Rash  . Wellbutrin [Bupropion] Other (See Comments)    Seizures and hallucinations    CURRENT MEDICATIONS:  Current Outpatient Prescriptions on File Prior to Visit  Medication Sig Dispense Refill  . ALPRAZolam (XANAX) 0.5 MG tablet TAKE 1 TABLET BY MOUTH TWICE A DAY  60 tablet  0  . aspirin 81 MG tablet Take 81 mg by mouth daily.       Marland Kitchen Bioflavonoid Products (GRAPE SEED PO) Take by mouth daily.      . Calcium Carbonate-Vitamin D (CALCIUM + D PO) Take 600 mg by mouth daily.      . carbidopa-levodopa (SINEMET IR) 25-100 MG per tablet Take 3 tablets by mouth 3 (three) times daily.  270 tablet  0  . cetirizine (ZYRTEC) 10 MG tablet Take 10 mg by mouth daily.      . Cholecalciferol (VITAMIN D3) 1000 UNITS CAPS Take 1 capsule by mouth daily.        . feeding  supplement (ENSURE COMPLETE) LIQD Take 237 mLs by mouth 2 (two) times daily between meals.  1 Bottle  11  . FLUoxetine (PROZAC) 20 MG capsule TAKE ONE CAPSULE BY MOUTH EVERY DAY  90 capsule  1  . gabapentin (NEURONTIN) 100 MG capsule       . losartan-hydrochlorothiazide (HYZAAR) 50-12.5 MG per tablet TAKE ONE BY MOUTH DAILY  90 tablet  0  . mirtazapine (REMERON) 30 MG tablet TAKE 1 TABLET BY MOUTH AT BEDTIME  30 tablet  5  . Ranitidine HCl ([employer] RANITIDINE PO) Take one by mouth daily, patient unsure of strength      . simvastatin (ZOCOR) 80 MG tablet TAKE 1 TABLET (80 MG TOTAL) BY MOUTH AT BEDTIME.  90 tablet  1  . vitamin B-12 (CYANOCOBALAMIN) 1000 MCG tablet Take 1,000 mcg by mouth daily.         No current facility-administered medications on file prior to visit.    PAST MEDICAL HISTORY:   Past Medical History  Diagnosis Date  . Dysmetabolic syndrome X   . Personal history of thrombophlebitis   . Essential hypertension, benign   . Morbid obesity   . Mixed hyperlipidemia   . Depressive disorder, not elsewhere classified   . Bell's palsy     1985  . Adjustment disorder with depressed mood   . DVT (deep venous thrombosis), left "early 2000's"    RLE  . Syncope and collapse 12/06/11    "this am; I pass out fairly often"  . Type II or unspecified type diabetes mellitus without mention of complication, not stated as uncontrolled 12/06/11    "not anymore; had lap band OR"  . Neurodegenerative gait disorder   . KYHCWCBJ(628.3) 05/2011    "pretty often since they put me on Parkinson's medicine"  . Parkinson disease     "they think that's what it is"  . DEMENTIA   . Disturbances of sensation  of smell and taste     "can't do either"  . Anxiety   . History of shingles     "as a teen and in my 60's"    PAST SURGICAL HISTORY:   Past Surgical History  Procedure Laterality Date  . Tonsillectomy and adenoidectomy  1970  . Bladder tack  11/1995  . Septoplasty  08/1998    with antral  window   . Knee arthroscopy  12/12/2002    right   . Fracture surgery  02/09/2006    bilateral elbows  . Fracture surgery      right knee  . Fracture surgery      tib plateau  . Breast cyst aspiration  12/1998    bilaterally  . Breast biopsy  05/20/2000    bilaterally  . Tubal ligation  1970's  . Cataract extraction w/ intraocular lens  implant, bilateral    . Laparoscopic gastric banding  2008  . Cardiac catheterization  2004    SOCIAL HISTORY:   History   Social History  . Marital Status: Widowed    Spouse Name: N/A    Number of Children: 2  . Years of Education: N/A   Occupational History  . 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 Smoking status: Never Smoker   . Smokeless tobacco: Never Used  . Alcohol Use: No     Comment: none  . Drug Use: No  . Sexual Activity: No   Other Topics Concern  . Not on file   Social History Narrative   Lives with daughter and granddaughter.   Daughter is HPOA.   DNR.    FAMILY HISTORY:   Family Status  Relation Status Death Age  . Mother Deceased 69    natural causes  . Father Deceased 37    MI  . Sister Alive     DM-2  . Child Alive     2, HTN, DM-2, hyperlipidemia    ROS:  A complete 10 system review of systems was obtained and was unremarkable apart from what is mentioned above.  PHYSICAL EXAMINATION:    VITALS:   Filed Vitals:   05/07/14 0836  BP: 110/78  Pulse: 76  Height: 5\' 1"  (1.549 m)  Weight: 158 lb (71.668 kg)     GEN:  The patient appears stated age and is in NAD. HEENT:  Normocephalic, atraumatic.  The mucous membranes are moist. The superficial temporal arteries are without ropiness or tenderness. CV:  RRR Lungs:  CTAB Neck/HEME:  There are no carotid bruits bilaterally.  Neurological examination:  Orientation: The patient was alert and oriented.  MocA 04/11/2012 was 25/30, 04/03/13 was 20/30, and 05/07/14 and was 26/30 Cranial nerves: There is L  facial droop with decreased forehead wrinkle on the L.  She has a positive stellwags sign. Pupils are equal round and reactive to light bilaterally. Fundoscopic exam reveals clear margins bilaterally. Extraocular muscles are intact but there is some upgaze paresis.   There are square wave jerks and difficulty with smooth pursuit.  The visual fields are full to confrontational testing. The speech is hypophonic but fluent and clear. She has no trouble repeating gutteral sounds.  Soft palate rises symmetrically and there is no tongue deviation. Hearing is intact to conversational tone. Sensation: Sensation is intact to light and pinprick throughout (facial, trunk, extremities). Vibration is intact at the bilateral big toe. There is no extinction with double simultaneous stimulation.  There is no sensory dermatomal level identified. Motor: Strength is 5/5 in the bilateral upper and lower extremities.   Shoulder shrug is equal and symmetric.  There is no pronator drift. Deep tendon reflexes: Deep tendon reflexes are 3/4 at the bilateral biceps, triceps, brachioradialis, and achilles. There are cross adductor reflexes at the knees bilaterally.  Plantar responses are upgoing bilaterally.  Movement examination: Tone: There is normal tone in the bilateral upper extremities today. The tone in the lower extremities is normal.  Abnormal movements: Rare tremor noted today Coordination:  There is decremation with RAM's, including heel taps bilaterally, finger taps and toe taps, most prominent on the L. Gait and Station: The patient is short stepped and mildly unstable.  She has some difficulty with turns.  LABS:  Lab Results  Component Value Date   WBC 15.0* 03/07/2014   HGB 13.1 03/07/2014   HCT 39.1 03/07/2014   MCV 93.7 03/07/2014   PLT 224.0 03/07/2014     Chemistry      Component Value Date/Time   NA 139 03/07/2014 0939   K 3.5 03/07/2014 0939   CL 102 03/07/2014 0939   CO2 29 03/07/2014 0939   BUN 18  03/07/2014 0939   CREATININE 0.7 03/07/2014 0939      Component Value Date/Time   CALCIUM 10.3 03/07/2014 0939   ALKPHOS 52 03/07/2014 0939   AST 22 03/07/2014 0939   ALT 6 03/07/2014 0939   BILITOT 0.4 03/07/2014 0939     Lab Results  Component Value Date   TSH 0.75 11/22/2013   Lab Results  Component Value Date   VITAMINB12 1387* 11/16/2012    Lab Results  Component Value Date   HGBA1C 6.0 11/22/2013   Lab Results  Component Value Date   ESRSEDRATE 18 04/03/2013    ASSESSMENT/PLAN: 1.  Parkinsonism.  Her primary issue is gait instability with slowness of movement.  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  However, both the patient and her daughter think that levodopa has been beneficial and they think it continues to be beneficial, as I actually considered tapering it.  Neither of them wanted that S. symptoms got worse when I tried this in the past.  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  -she will remain on carbidopa/levodopa 25/100, 3 tablets 3 times per day.  Risks, benefits, side effects and alternative therapies were discussed.  The opportunity to ask questions was given and they were answered to the best of my ability.  The patient expressed understanding and willingness to follow the outlined treatment protocols.  - She found physical therapy to be very beneficial.  It would be my recommendation that we do this every 6 months.  -Safety discussed.  Pt education provided. 2.  Hx of seizure.  This is likely secondary to the initiation of wellbutrin.  She should remain off of this medication.  EEG in the hospital was normal.  She has been seizure-free for several years. 3.  Depression.  I agree with the addition of remeron which has helped sleep, appetite and anxiety.    - I think her depression is primarily related to the fact that her daughter  won't let her drive.  I don't see any physical or memory reason why the patient cannot drive.  I told her we could do a neuropsych eval as well  as an occupational therapy driving evaluation and her daughter was absolutely adamant that she did not want her to proceed.  I think that her depression is also related to the presence of her daughter, as the minute her daughter entered the room (at the patient's request) the patient's demeanor immediately changed and the patient quit speaking.  Greater than 50% of visit spent in counseling with patient and daughter regarding memory, depression (pseudodementia) and driving.  Visit time was 30 min 4.  Headache.  -She is doing better with the help of [name].  She is on low dose gabapentin.

## 2014-05-20 ENCOUNTER — Other Ambulatory Visit: Payer: Self-pay | Admitting: *Deleted

## 2014-05-20 MED ORDER — ALPRAZOLAM 0.5 MG PO TABS: ORAL_TABLET | ORAL | Status: DC

## 2014-05-20 NOTE — Telephone Encounter (Signed)
Pt requesting medication refill. Last f/u appt 11/2013. Ok to fill per Dr Deborra Medina. Rx to be faxed to requested pharmacy before end of day, today.

## 2014-05-24 ENCOUNTER — Other Ambulatory Visit: Payer: Self-pay | Admitting: Neurology

## 2014-05-24 NOTE — Telephone Encounter (Signed)
Carbidopa Levodopa refill requested. Per last office note- patient to remain on medication. Refill approved and sent to patient's pharmacy.   

## 2014-06-13 ENCOUNTER — Encounter: Payer: Self-pay | Admitting: Family Medicine

## 2014-06-13 ENCOUNTER — Ambulatory Visit (INDEPENDENT_AMBULATORY_CARE_PROVIDER_SITE_OTHER): Payer: Medicare Other | Admitting: Family Medicine

## 2014-06-13 VITALS — BP 138/82 | HR 82 | Temp 97.9°F | Wt 157.8 lb

## 2014-06-13 DIAGNOSIS — F341 Dysthymic disorder: Secondary | ICD-10-CM

## 2014-06-13 DIAGNOSIS — I10 Essential (primary) hypertension: Secondary | ICD-10-CM

## 2014-06-13 DIAGNOSIS — Z87440 Personal history of urinary (tract) infections: Secondary | ICD-10-CM | POA: Insufficient documentation

## 2014-06-13 DIAGNOSIS — Z23 Encounter for immunization: Secondary | ICD-10-CM

## 2014-06-13 DIAGNOSIS — E782 Mixed hyperlipidemia: Secondary | ICD-10-CM

## 2014-06-13 LAB — POCT URINALYSIS DIPSTICK
Bilirubin, UA: NEGATIVE
Blood, UA: NEGATIVE
Glucose, UA: NEGATIVE
Ketones, UA: NEGATIVE
Leukocytes, UA: NEGATIVE
Nitrite, UA: NEGATIVE
Protein, UA: NEGATIVE
Spec Grav, UA: 1.02
Urobilinogen, UA: 0.2
pH, UA: 6.5

## 2014-06-13 MED ORDER — SIMVASTATIN 20 MG PO TABS: 20.0000 mg | ORAL_TABLET | Freq: Every day | ORAL | Status: DC

## 2014-06-13 MED ORDER — LOSARTAN POTASSIUM-HCTZ 50-12.5 MG PO TABS: ORAL_TABLET | ORAL | Status: DC

## 2014-06-13 MED ORDER — GLUCOSE BLOOD VI STRP: ORAL_STRIP | Status: DC

## 2014-06-13 MED ORDER — ALPRAZOLAM 0.5 MG PO TABS: ORAL_TABLET | ORAL | Status: DC

## 2014-06-13 NOTE — Assessment & Plan Note (Signed)
Well controlled. Discussed with Ms. Pilat- I would like to decrease her dose of simvastatin as she has been well controlled on a high dose.  She is in agreement with this plan.  Will decrease dose to 20 mg daily, follow up labs in 4- 8 weeks.

## 2014-06-13 NOTE — Addendum Note (Signed)
Addended by: Modena Nunnery on: 06/13/2014 03:00 PM   Modules accepted: Orders

## 2014-06-13 NOTE — Progress Notes (Signed)
Subjective:   Patient ID: [name], female    DOB: 1946/03/06, 68 y.o.   MRN: [id number]  [name] is a pleasant 68 y.o. year old female who presents to clinic today with Follow-up and Nasal Congestion  on 06/13/2014  HPI:  HTN- taking Hyzaar.  BP has been well controlled. Denies HA, blurred vision, CP or SOB. No LE edema.  Depression- taking Remeron and Prozac. Feels rxs are working well.  HLD- taking zocor 80 mg daily.  Has been on this dose for years.  LDL is quite low.  No h/o MI, non smoker, diabetes diet controlled. Lab Results  Component Value Date   CHOL 153 11/22/2013   HDL 73.40 11/22/2013   LDLCALC 57 11/22/2013   TRIG 111.0 11/22/2013   CHOLHDL 2 11/22/2013   Parkinsonism-  Has been seeing Dr. Carles Collet.  Last saw her on 10/13- note reviewed. No changes made to rx.  H/o recurrent UTIs- denies dysuria - "I never have symptoms."  She would like her urine checked today. Denies n/v, no back pain, no increased urinary frequency.  Current Outpatient Prescriptions on File Prior to Visit  Medication Sig Dispense Refill  . aspirin 81 MG tablet Take 81 mg by mouth daily.     Marland Kitchen Bioflavonoid Products (GRAPE SEED PO) Take by mouth daily.    . Calcium Carbonate-Vitamin D (CALCIUM + D PO) Take 600 mg by mouth daily.    . carbidopa-levodopa (SINEMET IR) 25-100 MG per tablet TAKE 3 TABLETS BY MOUTH 3 (THREE) TIMES DAILY. KEEP APPOINTMENT ON 05/07/13 FOR ADDITIONAL REFILLS 270 tablet 5  . cetirizine (ZYRTEC) 10 MG tablet Take 10 mg by mouth daily.    . Cholecalciferol (VITAMIN D3) 1000 UNITS CAPS Take 1 capsule by mouth daily.      . feeding supplement (ENSURE COMPLETE) LIQD Take 237 mLs by mouth 2 (two) times daily between meals. 1 Bottle 11  . FLUoxetine (PROZAC) 20 MG capsule TAKE ONE CAPSULE BY MOUTH EVERY DAY 90 capsule 1  . gabapentin (NEURONTIN) 100 MG capsule     . losartan-hydrochlorothiazide (HYZAAR) 50-12.5 MG per tablet TAKE ONE BY MOUTH DAILY 90 tablet 0    . mirtazapine (REMERON) 30 MG tablet TAKE 1 TABLET BY MOUTH AT BEDTIME 30 tablet 5  . Ranitidine HCl ([facility] RANITIDINE PO) Take one by mouth daily, patient unsure of strength    . vitamin B-12 (CYANOCOBALAMIN) 1000 MCG tablet Take 1,000 mcg by mouth daily.       No current facility-administered medications on file prior to visit.    Allergies  Allergen Reactions  . Codeine Sulfate     REACTION: hallucinations  . Penicillins Anaphylaxis, Swelling and Rash  . Wellbutrin [Bupropion] Other (See Comments)    Seizures and hallucinations    Past Medical History  Diagnosis Date  . Dysmetabolic syndrome X   . Personal history of thrombophlebitis   . Essential hypertension, benign   . Morbid obesity   . Mixed hyperlipidemia   . Depressive disorder, not elsewhere classified   . Bell's palsy     1985  . Adjustment disorder with depressed mood   . DVT (deep venous thrombosis), left "early 2000's"    RLE  . Syncope and collapse 12/06/11    "this am; I pass out fairly often"  . Type II or unspecified type diabetes mellitus without mention of complication, not stated as uncontrolled 12/06/11    "not anymore; had lap band OR"  . Neurodegenerative gait disorder   .  XVQMGQQP(619.5) 05/2011    "pretty often since they put me on Parkinson's medicine"  . Parkinson disease     "they think that's what it is"  . DEMENTIA   . 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 Anxiety   . History of shingles     "as a teen and in my 25's"    Past Surgical History  Procedure Laterality Date  . Tonsillectomy and adenoidectomy  1970  . Bladder tack  11/1995  . Septoplasty  08/1998    with antral window   . Knee arthroscopy  12/12/2002    right   . Fracture surgery  02/09/2006    bilateral elbows  . Fracture surgery      right knee  . Fracture surgery      tib plateau  . Breast cyst aspiration  12/1998    bilaterally  . Breast biopsy  05/20/2000    bilaterally  .  Tubal ligation  1970's  . Cataract extraction w/ intraocular lens  implant, bilateral    . Laparoscopic gastric banding  2008  . Cardiac catheterization  2004    Family History  Problem Relation Age of Onset  . COPD Father   . Diabetes Sister   . Vision loss Sister     legally blind  . Asthma Mother   . Heart disease Father     History   Social History  . Marital Status: Widowed    Spouse Name: N/A    Number of Children: 2  . Years of Education: N/A   Occupational History  . 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 Smoking status: Never Smoker   . Smokeless tobacco: Never Used  . Alcohol Use: No     Comment: none  . Drug Use: No  . Sexual Activity: No   Other Topics Concern  . Not on file   Social History Narrative   Lives with daughter and granddaughter.   Daughter is HPOA.   DNR.   The PMH, PSH, Social History, Family History, Medications, and allergies have been reviewed in [facility], and have been updated if relevant.   Review of Systems  Constitutional: Negative.   Eyes: Negative.  Negative for discharge.  Respiratory: Negative.   Genitourinary: Negative.   Musculoskeletal: Negative.        Objective:    BP 138/82 mmHg  Pulse 82  Temp(Src) 97.9 F (36.6 C) (Oral)  Wt 157 lb 12 oz (71.555 kg)  SpO2 98%   Physical Exam  Constitutional: She is oriented to person, place, and time. She appears well-developed and well-nourished. No distress.  HENT:  Head: Normocephalic.  Musculoskeletal: Normal range of motion.  Neurological: She is alert and oriented to person, place, and time.  Skin: Skin is warm and dry.  Psychiatric: She has a normal mood and affect. Her behavior is normal. Judgment and thought content normal.          Assessment & Plan:   Need for influenza vaccination - Plan: Flu Vaccine QUAD 36+ mos PF IM (Fluarix Quad PF)  HYPERTENSION, BENIGN ESSENTIAL  HYPERLIPIDEMIA, MIXED  ANXIETY  DEPRESSION  History of recurrent UTIs No Follow-up on file.

## 2014-06-13 NOTE — Progress Notes (Signed)
Pre visit review using our clinic review tool, if applicable. No additional management support is needed unless otherwise documented below in the visit note. 

## 2014-06-13 NOTE — Patient Instructions (Addendum)
We are decreasing your simvastatin to 20 mg daily. Please come back in 4-8 weeks for blood work.  Have a wonderful holiday.

## 2014-06-13 NOTE — Assessment & Plan Note (Signed)
Well controlled. No changes made today. 

## 2014-06-13 NOTE — Assessment & Plan Note (Signed)
UA neg. Reassurance provided. 

## 2014-07-05 ENCOUNTER — Encounter: Payer: Self-pay | Admitting: Internal Medicine

## 2014-07-05 ENCOUNTER — Ambulatory Visit (INDEPENDENT_AMBULATORY_CARE_PROVIDER_SITE_OTHER): Payer: Medicare Other | Admitting: Internal Medicine

## 2014-07-05 VITALS — BP 118/76 | HR 79 | Temp 97.8°F | Wt 157.0 lb

## 2014-07-05 DIAGNOSIS — R8290 Unspecified abnormal findings in urine: Secondary | ICD-10-CM

## 2014-07-05 DIAGNOSIS — R8299 Other abnormal findings in urine: Secondary | ICD-10-CM

## 2014-07-05 LAB — POCT URINALYSIS DIPSTICK
Bilirubin, UA: NEGATIVE
Blood, UA: NEGATIVE
Glucose, UA: NEGATIVE
Ketones, UA: NEGATIVE
Nitrite, UA: NEGATIVE
Spec Grav, UA: 1.02
Urobilinogen, UA: NEGATIVE
pH, UA: 7.5

## 2014-07-05 MED ORDER — NITROFURANTOIN MONOHYD MACRO 100 MG PO CAPS: 100.0000 mg | ORAL_CAPSULE | Freq: Two times a day (BID) | ORAL | Status: DC

## 2014-07-05 NOTE — Patient Instructions (Signed)
Asymptomatic Bacteriuria Asymptomatic bacteriuria is the presence of a large number of bacteria in your urine without the usual symptoms of burning or frequent urination. The following conditions increase the risk of asymptomatic bacteriuria:  Diabetes mellitus.  Advanced age.  Pregnancy in the first trimester.  Kidney stones.  Kidney transplants.  Leaky kidney tube valve in young children (reflux). Treatment for this condition is not needed in most people and can lead to other problems such as too much yeast and growth of resistant bacteria. However, some people, such as pregnant women, do need treatment to prevent kidney infection. Asymptomatic bacteriuria in pregnancy is also associated with fetal growth restriction, premature labor, and newborn death. HOME CARE INSTRUCTIONS Monitor your condition for any changes. The following actions may help to relieve any discomfort you are feeling:  Drink enough water and fluids to keep your urine clear or pale yellow. Go to the bathroom more often to keep your bladder empty.  Keep the area around your vagina and rectum clean. Wipe yourself from front to back after urinating. SEEK IMMEDIATE MEDICAL CARE IF:  You develop signs of an infection such as:  Burning with urination.  Frequency of voiding.  Back pain.  Fever.  You have blood in the urine.  You develop a fever. MAKE SURE YOU:  Understand these instructions.  Will watch your condition.  Will get help right away if you are not doing well or get worse. Document Released: 07/12/2005 Document Revised: 11/26/2013 Document Reviewed: 01/01/2013 Exit[employer] Patient Information 2015 [employer]. This information is not intended to replace advice given to you by your health care provider. Make sure you discuss any questions you have with your health care provider.  

## 2014-07-05 NOTE — Progress Notes (Signed)
Pre visit review using our clinic review tool, if applicable. No additional management support is needed unless otherwise documented below in the visit note. 

## 2014-07-05 NOTE — Progress Notes (Signed)
HPI  Pt presents to the clinic today with c/o cloudy urine and foul odor. She reports this started 2 days ago. She denies fever, chills, nausea or low back pain. She has not tried anything OTC. She denies vaginal complaints. She does have a history of recurrent UTI's.   Review of Systems  Past Medical History  Diagnosis Date  . Dysmetabolic syndrome X   . Personal history of thrombophlebitis   . Essential hypertension, benign   . Morbid obesity   . Mixed hyperlipidemia   . Depressive disorder, not elsewhere classified   . Bell's palsy     1985  . Adjustment disorder with depressed mood   . DVT (deep venous thrombosis), left "early 2000's"    RLE  . Syncope and collapse 12/06/11    "this am; I pass out fairly often"  . Type II or unspecified type diabetes mellitus without mention of complication, not stated as uncontrolled 12/06/11    "not anymore; had lap band OR"  . Neurodegenerative gait disorder   . GQBVQXIH(038.8) 05/2011    "pretty often since they put me on Parkinson's medicine"  . Parkinson disease     "they think that's what it is"  . DEMENTIA   . 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 Anxiety   . History of shingles     "as a teen and in my 70's"    Family History  Problem Relation Age of Onset  . COPD Father   . Diabetes Sister   . Vision loss Sister     legally blind  . Asthma Mother   . Heart disease Father     History   Social History  . Marital Status: Widowed    Spouse Name: N/A    Number of Children: 2  . Years of Education: N/A   Occupational History  . 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 Smoking status: Never Smoker   . Smokeless tobacco: Never Used  . Alcohol Use: No     Comment: none  . Drug Use: No  . Sexual Activity: No   Other Topics Concern  . Not on file   Social History Narrative   Lives with daughter and granddaughter.   Daughter is HPOA.   DNR.     Allergies  Allergen Reactions  . Codeine Sulfate     REACTION: hallucinations  . Penicillins Anaphylaxis, Swelling and Rash  . Wellbutrin [Bupropion] Other (See Comments)    Seizures and hallucinations    Constitutional: Denies fever, malaise, fatigue, headache or abrupt weight changes.   GU: Pt reports cloudy urine and odor. Denies urgency, frequency, burning sensation, blood in urine, or discharge. Skin: Denies redness, rashes, lesions or ulcercations.   No other specific complaints in a complete review of systems (except as listed in HPI above).    Objective:   Physical Exam   Wt Readings from Last 3 Encounters:  07/05/14 157 lb (71.215 kg)  06/13/14 157 lb 12 oz (71.555 kg)  05/07/14 158 lb (71.668 kg)    General: Appears her stated age, obese but well developed, well nourished in NAD. Cardiovascular: Normal rate and rhythm. S1,S2 noted.  No murmur, rubs or gallops noted.  Pulmonary/Chest: Normal effort and positive vesicular breath sounds. No respiratory distress. No wheezes, rales or ronchi noted.  Abdomen: Soft and nontender. Normal bowel sounds, no bruits noted. No distention  or masses noted. Liver, spleen and kidneys non palpable. No CVA tenderness.      Assessment & Plan:   Cloudy urine and odor:  Urinalysis: 2+ leuks, + protein Will send for culture eRx sent if for Macrobid 100 mg BID x 5 days Drink plenty of fluids  RTC as needed or if symptoms persist.

## 2014-07-05 NOTE — Addendum Note (Signed)
Addended by: Lurlean Nanny on: 07/05/2014 03:40 PM   Modules accepted: Orders

## 2014-07-08 LAB — URINE CULTURE

## 2014-07-12 ENCOUNTER — Other Ambulatory Visit: Payer: Self-pay | Admitting: *Deleted

## 2014-07-12 MED ORDER — ALPRAZOLAM 0.5 MG PO TABS: ORAL_TABLET | ORAL | Status: DC

## 2014-07-12 NOTE — Telephone Encounter (Signed)
Pt requesting medication refill. Last f/u appt 05/2014. pls advise 

## 2014-07-12 NOTE — Telephone Encounter (Signed)
Rx called in to requested pharmacy 

## 2014-07-30 ENCOUNTER — Telehealth: Payer: Self-pay | Admitting: Neurology

## 2014-07-30 NOTE — Telephone Encounter (Signed)
Spoke with patient to see if she would like [place] to send referral to [facility] for physical therapy. She declines PT at this time. Will call if she changes her mind.

## 2014-08-06 ENCOUNTER — Other Ambulatory Visit (INDEPENDENT_AMBULATORY_CARE_PROVIDER_SITE_OTHER): Payer: Medicare Other

## 2014-08-06 DIAGNOSIS — E782 Mixed hyperlipidemia: Secondary | ICD-10-CM

## 2014-08-06 LAB — LIPID PANEL
Cholesterol: 170 mg/dL (ref 0–200)
HDL: 59.7 mg/dL (ref >39.00)
LDL Cholesterol: 75 mg/dL (ref 0–99)
NonHDL: 110.3
Total CHOL/HDL Ratio: 3
Triglycerides: 175 mg/dL — ABNORMAL HIGH (ref 0.0–149.0)
VLDL: 35 mg/dL (ref 0.0–40.0)

## 2014-08-06 LAB — COMPREHENSIVE METABOLIC PANEL
ALT: 7 U/L (ref 0–35)
AST: 19 U/L (ref 0–37)
Albumin: 3.9 g/dL (ref 3.5–5.2)
Alkaline Phosphatase: 51 U/L (ref 39–117)
BUN: 21 mg/dL (ref 6–23)
CO2: 33 mEq/L — ABNORMAL HIGH (ref 19–32)
Calcium: 9.9 mg/dL (ref 8.4–10.5)
Chloride: 103 mEq/L (ref 96–112)
Creatinine, Ser: 0.7 mg/dL (ref 0.4–1.2)
GFR: 89.5 mL/min (ref >60.00)
Glucose, Bld: 100 mg/dL — ABNORMAL HIGH (ref 70–99)
Potassium: 4 mEq/L (ref 3.5–5.1)
Sodium: 140 mEq/L (ref 135–145)
Total Bilirubin: 0.6 mg/dL (ref 0.2–1.2)
Total Protein: 6.8 g/dL (ref 6.0–8.3)

## 2014-08-13 ENCOUNTER — Other Ambulatory Visit: Payer: Self-pay | Admitting: Family Medicine

## 2014-08-14 NOTE — Telephone Encounter (Signed)
Rx called in to requested pharmacy 

## 2014-08-14 NOTE — Telephone Encounter (Signed)
Last f/u appt 05/2014. pls advise

## 2014-09-06 ENCOUNTER — Other Ambulatory Visit: Payer: Self-pay | Admitting: Family Medicine

## 2014-09-09 ENCOUNTER — Other Ambulatory Visit: Payer: Self-pay | Admitting: *Deleted

## 2014-09-09 MED ORDER — ALPRAZOLAM 0.5 MG PO TABS: 0.5000 mg | ORAL_TABLET | Freq: Two times a day (BID) | ORAL | Status: DC

## 2014-09-09 NOTE — Telephone Encounter (Signed)
Ok to  Phone in xanax to be filled on or after 09/14/14

## 2014-09-09 NOTE — Telephone Encounter (Signed)
Patient called stating that [facility] told her that they requested a refill on her Alprazolam and received a message back that she was not a patient here. Last refill 08/14/14 #60. Is it okay to refill medication?

## 2014-09-10 ENCOUNTER — Other Ambulatory Visit: Payer: Self-pay | Admitting: Family Medicine

## 2014-09-10 NOTE — Telephone Encounter (Signed)
Rx called to pharmacy as instructed. 

## 2014-09-10 NOTE — Telephone Encounter (Signed)
Rx called to pharmacy. Left detailed message on answering machine that script has been called in.

## 2014-10-01 DIAGNOSIS — R51 Headache: Secondary | ICD-10-CM | POA: Diagnosis not present

## 2014-10-01 DIAGNOSIS — G4451 Hemicrania continua: Secondary | ICD-10-CM | POA: Diagnosis not present

## 2014-10-08 ENCOUNTER — Encounter: Payer: Self-pay | Admitting: Neurology

## 2014-10-08 ENCOUNTER — Ambulatory Visit (INDEPENDENT_AMBULATORY_CARE_PROVIDER_SITE_OTHER): Payer: Medicare Other | Admitting: Neurology

## 2014-10-08 VITALS — BP 120/78 | HR 72 | Ht 61.5 in | Wt 157.0 lb

## 2014-10-08 DIAGNOSIS — R202 Paresthesia of skin: Secondary | ICD-10-CM | POA: Diagnosis not present

## 2014-10-08 DIAGNOSIS — G609 Hereditary and idiopathic neuropathy, unspecified: Secondary | ICD-10-CM

## 2014-10-08 DIAGNOSIS — G214 Vascular parkinsonism: Secondary | ICD-10-CM

## 2014-10-08 DIAGNOSIS — F330 Major depressive disorder, recurrent, mild: Secondary | ICD-10-CM | POA: Diagnosis not present

## 2014-10-08 DIAGNOSIS — R413 Other amnesia: Secondary | ICD-10-CM

## 2014-10-08 MED ORDER — GABAPENTIN 100 MG PO CAPS
100.0000 mg | ORAL_CAPSULE | Freq: Three times a day (TID) | ORAL | Status: DC
Start: 2014-10-08 — End: 2015-04-08

## 2014-10-08 NOTE — Patient Instructions (Signed)
1. We will call you with an appt for your EMG.  2. You have been referred to Dr Nathanial Rancher for Neuro Psych testing. They will call you directly to schedule an appointment. Please call [phone] if you do not hear from them.

## 2014-10-08 NOTE — Progress Notes (Signed)
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  This patient is accompanied in the office by her child who supplements the history.      She is on carbidopa/levodopa 25/100, 3 tablets 3 times per day.  She is doing well on this medication, without side effects.  She had one fall the day before Thanksgiving.  She was in a parking lot of a restaurant and tripped over something(curb).  She fx her nose and had to have her lip stitched up.    Since then, however, she has done very well.  Her daughter reports that she has been very stable on her feet.  Last visit, she was complaining of a decreased appetite and increased anxiety.  Therefore, I increased her Remeron to 30 mg at night.  This helped with both sleep and appetite and she is very pleased.  She also has a hx of seizure, likely secondary to Wellbutrin.  Last visit, her keppra was d/c.  She has been seizure free since that time.  However, mood and depression continue to be an issue.  I sent her to Dr. Cheryln Manly but she didn't follow up.  She states that she cannot talk to him without her daughter present.  Her biggest issue is that her granddaughter is a Ship broker and she lives with the patient.  Apparently, the majority of the house is clean and safe with the exception of the granddaughters room.  The patient states that the mess just drives her crazy and she cannot stop thinking about it.  Her granddaughter goes to community college and her leaving the house is not an option.  04/03/13 update:  [name].  She was supposed to f/u with Dr Cheryln Manly due to significant anxiety and depression and she cancelled her 3/4, 3/13 and 3/27 visits and never r/s.  She has not seen him since 10/13.  She states that she is just "not going to talk with someone."  She, however, is doing some better in that regard.  Much of her stress surrounds her granddaughter, who lives with  her and is a Ship broker.  The patient states that her granddaughter has now admitted to this, but to seek help.  They were able to get some of the room cleaned up a while ago and that seemed to really help the patient's mental state.  However, the room has reconnected and is now just as it previously was.  She also reports a significant amount of stress over her sister who has end-stage Alzheimer's dementia.  Her sister no longer recognizes her, and the patient realizes when she goes around her sister, it makes her symptoms much worse.  She feels that her memory is getting worse, but nothing like her sisters.  She also thinks that her speech is getting worse.    She is having some headaches, similar to what she had in the past, prior to starting on the Levodopa.  It is over the L forehead, but not in the eye.  It is pounding.  It is daily.  It does not cause n/v or cause photophobia or phonophobia.  It makes her feel "drained."  It does not prevent her from doing things.  There are no lateralizing paresthesias with the headaches.  There are no vision changes.  07/30/13 update:  The pt is on carbidopa/levodopa 25/100 three tablets three times per day.  She is doing well in that  regard.  She did not go to PT because of transportation issues.  She was started on zanaflex last visit for HA and was also started on the exelon patch.  Interestingly, she states that the exelon patch caused headaches but it was started after the headache.  Nonetheless, she stopped it and still has headache.  It starts in the L forehead and radiates over the L side of the head.  She still thinks that the patch made them worse however and also thinks that it caused UTI's.  She is still on zanaflex but isn't sure its helping.  No falls/hallucination/syncope/lightheadedness.  11/27/13 update:  Patient is following up today in the movement disorder clinic.  She is currently on carbidopa/levodopa 25/100, 3 tablets by mouth 3 times per day.  Since our  last visit, I did get a note from Dr. Domingo Cocking at the headache clinic.  He started the patient on Indocin 25 mg 3 times a day and she was told to titrate to 75 mg 3 times a day.  She states that she had SE with that and she was changed to gabapentin 100 mg and that is helping.  He also scheduled her for trigger point injections and an MRI brain.  She did not care for the trigger point injections although they did help.  She has f/u in June.  Walking seems to have been a little slower, but no falls.  Her daughter is with her today and feels that walking has been more tenuous.   No hallucinations.  No diplopia but is having blurry vision.  Does have vision appt in [facility] in few weeks.  05/07/14 update:   Patient is following up today in the movement disorder clinic.  She is currently on carbidopa/levodopa 25/100, 3 tablets by mouth 3 times per day.  Daughter states that she often forgets middle of the day dose but pt denies that.  She takes it at 9-10 am on awakening, 3 pm and bedtime.  She and her daughter both think that it is helpful.   No diplopia.  No falls.  She saw Dr. Domingo Cocking in June and I got a note from him.  She had more trigger point injections.  She is doing really well from a headache standpoint.  She doesn't have to return to him for 6 months (saw him in September).  She continues on remeron for her depression and mood.  She states that she hates that she is confined to the home and depression is tied into that.  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  She then asks if her daughter can come back to the room for the discussion and she did.   She did do therapy with [facility] and she thought that it went well.   10/08/14 update:   Patient is following up today in the movement disorder clinic. She is accompanied by her daughter who supplements the hx.  She is currently on carbidopa/levodopa 25/100, 3 tablets  by mouth 3 times per day.  C/o memory loss.  States that her daughter will tell her to go to the kitchen to get things and she will get there and not be able to remember.  Her daughter thinks that she jumps subjects.  Also c/o feet paresthesias.  Worries that DM has "returned" as used to be diabetic but not anymore.  Last A1C in April was 6.0.  Also  states that right leg below the knee posteriorally she is having paresthesias.  Started after she bent over one day and the knee "popped."  No LBP.  On gabapentin but that is for headache, which is well controlled.  She has been headache free for a long time.  Received a note from Dr. Domingo Cocking recently and pt told to continue meds.    Neuroimaging has previously been performed.   It was done on 09/13/2013 and I reviewed these films.  There was atrophy and moderate small vessel disease.  MRI brain,09/13/13  FINDINGS:  Advanced cerebral and cerebellar atrophy. Chronic microvascular  ischemic change affecting the periventricular and subcortical white  matter as well as the brainstem above moderate to advanced degree.  No acute stroke, acute hemorrhage, mass lesion, or extra-axial  fluid. Hydrocephalus ex vacuo. No features to suggest normal  pressure hydrocephalus.  Post infusion, no abnormal enhancement of the brain or meninges. No  acute or reversible cause of dementia is evident.  Pituitary, pineal, and cerebellar tonsils unremarkable. No upper  cervical lesions. Flow voids are maintained throughout the carotid,  basilar, and vertebral arteries. There are no areas of chronic  hemorrhage. Visualized calvarium, skull base, and upper cervical  osseous structures unremarkable. Scalp and extracranial soft  tissues, orbits, sinuses, and mastoids show no acute process.  Similar appearance to priors.  IMPRESSION:  Atrophy and small vessel disease. No acute intracranial findings.    PREVIOUS MEDICATIONS: Sinemet  ALLERGIES:   Allergies  Allergen  Reactions  . Codeine Sulfate     REACTION: hallucinations  . Penicillins Anaphylaxis, Swelling and Rash  . Wellbutrin [Bupropion] Other (See Comments)    Seizures and hallucinations    CURRENT MEDICATIONS:  Current Outpatient Prescriptions on File Prior to Visit  Medication Sig Dispense Refill  . ALPRAZolam (XANAX) 0.5 MG tablet Take 1 tablet (0.5 mg total) by mouth 2 (two) times daily. 60 tablet 0  . aspirin 81 MG tablet Take 81 mg by mouth daily.     Marland Kitchen Bioflavonoid Products (GRAPE SEED PO) Take by mouth daily.    . Calcium Carbonate-Vitamin D (CALCIUM + D PO) Take 600 mg by mouth daily.    . carbidopa-levodopa (SINEMET IR) 25-100 MG per tablet TAKE 3 TABLETS BY MOUTH 3 (THREE) TIMES DAILY. KEEP APPOINTMENT ON 05/07/13 FOR ADDITIONAL REFILLS 270 tablet 5  . cetirizine (ZYRTEC) 10 MG tablet Take 10 mg by mouth daily.    . Cholecalciferol (VITAMIN D3) 1000 UNITS CAPS Take 1 capsule by mouth daily.      . feeding supplement (ENSURE COMPLETE) LIQD Take 237 mLs by mouth 2 (two) times daily between meals. 1 Bottle 11  . FLUoxetine (PROZAC) 20 MG capsule TAKE ONE CAPSULE BY MOUTH EVERY DAY 90 capsule 1  . gabapentin (NEURONTIN) 100 MG capsule     . glucose blood test strip Use as instructed 100 each 12  . losartan-hydrochlorothiazide (HYZAAR) 50-12.5 MG per tablet TAKE 1 TABLET BY MOUTH DAILY 90 tablet 1  . mirtazapine (REMERON) 30 MG tablet TAKE 1 TABLET BY MOUTH AT BEDTIME 30 tablet 5  . Multiple Vitamins-Minerals (CENTRUM SILVER ADULT 50+) TABS Take by mouth.    . nitrofurantoin, macrocrystal-monohydrate, (MACROBID) 100 MG capsule Take 1 capsule (100 mg total) by mouth 2 (two) times daily. 10 capsule 0  . Ranitidine HCl ([employer] RANITIDINE PO) Take one by mouth daily, patient unsure of strength    . simvastatin (ZOCOR) 20 MG tablet Take 1 tablet (20 mg total) by mouth  at bedtime. 90 tablet 3  . vitamin B-12 (CYANOCOBALAMIN) 1000 MCG tablet Take 1,000 mcg by mouth daily.       No current  facility-administered medications on file prior to visit.    PAST MEDICAL HISTORY:   Past Medical History  Diagnosis Date  . Dysmetabolic syndrome X   . Personal history of thrombophlebitis   . Essential hypertension, benign   . Morbid obesity   . Mixed hyperlipidemia   . Depressive disorder, not elsewhere classified   . Bell's palsy     1985  . Adjustment disorder with depressed mood   . DVT (deep venous thrombosis), left "early 2000's"    RLE  . Syncope and collapse 12/06/11    "this am; I pass out fairly often"  . Type II or unspecified type diabetes mellitus without mention of complication, not stated as uncontrolled 12/06/11    "not anymore; had lap band OR"  . Neurodegenerative gait disorder   . HENIDPOE(423.5) 05/2011    "pretty often since they put me on Parkinson's medicine"  . Parkinson disease     "they think that's what it is"  . DEMENTIA   . 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 Anxiety   . History of shingles     "as a teen and in my 55's"    PAST SURGICAL HISTORY:   Past Surgical History  Procedure Laterality Date  . Tonsillectomy and adenoidectomy  1970  . Bladder tack  11/1995  . Septoplasty  08/1998    with antral window   . Knee arthroscopy  12/12/2002    right   . Fracture surgery  02/09/2006    bilateral elbows  . Fracture surgery      right knee  . Fracture surgery      tib plateau  . Breast cyst aspiration  12/1998    bilaterally  . Breast biopsy  05/20/2000    bilaterally  . Tubal ligation  1970's  . Cataract extraction w/ intraocular lens  implant, bilateral    . Laparoscopic gastric banding  2008  . Cardiac catheterization  2004    SOCIAL HISTORY:   History   Social History  . Marital Status: Widowed    Spouse Name: N/A  . Number of Children: 2  . Years of Education: N/A   Occupational History  . 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 Smoking status:  Never Smoker   . Smokeless tobacco: Never Used  . Alcohol Use: No     Comment: none  . Drug Use: No  . Sexual Activity: No   Other Topics Concern  . Not on file   Social History Narrative   Lives with daughter and granddaughter.   Daughter is HPOA.   DNR.    FAMILY HISTORY:   Family Status  Relation Status Death Age  . Mother Deceased 54    natural causes  . Father Deceased 35    MI  . Sister Alive     DM-2  . Child Alive     2, HTN, DM-2, hyperlipidemia    ROS:  A complete 10 system review of systems was obtained and was unremarkable apart from what is mentioned above.  PHYSICAL EXAMINATION:    VITALS:   Filed Vitals:   10/08/14 0824  BP: 120/78  Pulse: 72  Height: 5' 1.5" (1.562 m)  Weight: 157  lb (71.215 kg)     GEN:  The patient appears stated age and is in NAD. HEENT:  Normocephalic, atraumatic.  The mucous membranes are moist. The superficial temporal arteries are without ropiness or tenderness. CV:  RRR Lungs:  CTAB Neck/HEME:  There are no carotid bruits bilaterally.  Neurological examination:  Orientation: The patient was alert and oriented x 3.  MocA 04/11/2012 was 25/30, 04/03/13 was 20/30, and 05/07/14 and was 26/30 Cranial nerves: There is L facial droop with decreased forehead wrinkle on the L.  She has a positive stellwags sign. Pupils are equal round and reactive to light bilaterally. Fundoscopic exam reveals clear margins bilaterally. Extraocular muscles are intact.  There are square wave jerks and difficulty with smooth pursuit.  The visual fields are full to confrontational testing. The speech is hypophonic but fluent and clear. She has no trouble repeating gutteral sounds.  Soft palate rises symmetrically and there is no tongue deviation. Hearing is intact to conversational tone. Sensation: Sensation is intact to light and pinprick throughout (facial, trunk, extremities). Vibration is decreased at the bilateral big toe. There is no extinction with  double simultaneous stimulation. There is no sensory dermatomal level identified. Motor: Strength is 5/5 in the bilateral upper and lower extremities.   Shoulder shrug is equal and symmetric.  There is no pronator drift. Deep tendon reflexes: Deep tendon reflexes are 3/4 at the bilateral biceps, triceps, brachioradialis, and patella.  There are cross adductor reflexes at the knees bilaterally.  Plantar responses are upgoing bilaterally.  Movement examination: Tone: There is normal tone in the bilateral upper extremities today. The tone in the lower extremities is normal.  Abnormal movements: Rare tremor noted today in b/l thumbs only. Coordination:  There is decremation with RAM's, including finger taps on the L and heel taps on right. Gait and Station: The patient is short stepped and mildly unstable.  She has some difficulty with turns.  LABS:  Lab Results  Component Value Date   WBC 15.0* 03/07/2014   HGB 13.1 03/07/2014   HCT 39.1 03/07/2014   MCV 93.7 03/07/2014   PLT 224.0 03/07/2014     Chemistry      Component Value Date/Time   NA 140 08/06/2014 0818   K 4.0 08/06/2014 0818   CL 103 08/06/2014 0818   CO2 33* 08/06/2014 0818   BUN 21 08/06/2014 0818   CREATININE 0.7 08/06/2014 0818      Component Value Date/Time   CALCIUM 9.9 08/06/2014 0818   ALKPHOS 51 08/06/2014 0818   AST 19 08/06/2014 0818   ALT 7 08/06/2014 0818   BILITOT 0.6 08/06/2014 0818     Lab Results  Component Value Date   TSH 0.75 11/22/2013   Lab Results  Component Value Date   VITAMINB12 1387* 11/16/2012    Lab Results  Component Value Date   HGBA1C 6.0 11/22/2013   Lab Results  Component Value Date   ESRSEDRATE 18 04/03/2013   Lab Results  Component Value Date   VITAMINB12 1387* 11/16/2012    ASSESSMENT/PLAN: 1.  Parkinsonism.  Her primary issue is gait instability with slowness of movement.  I have seen the patient for a few years and while I have thought that the patient had PSP,  I have not seen a downward progression like I would expect with that.  However, both the patient and her daughter think that levodopa has been beneficial and they think it continues to be beneficial, as I actually considered tapering it.  Neither of them wanted that S. symptoms got worse when I tried this in the past.  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  -she will remain on carbidopa/levodopa 25/100, 3 tablets 3 times per day.  Risks, benefits, side effects and alternative therapies were discussed.  The opportunity to ask questions was given and they were answered to the best of my ability.  The patient expressed understanding and willingness to follow the outlined treatment protocols.  - She found physical therapy to be very beneficial.  It would be my recommendation that we do this every 6 months.  -Safety discussed.  Pt education provided. 2.  Hx of seizure.  This is likely secondary to the initiation of wellbutrin.  She should remain off of this medication.  EEG in the hospital was normal.  She has been seizure-free for several years. 3.  Depression.  I agree with the addition of remeron which has helped sleep, appetite and anxiety.    - I think her depression is situational and related to not being able to drive.  They don't want to pursue driving eval.  No SI/HI 4.  Memory change  -Think there could be MCI and also pseudodementia from depression.  Neuropsych testing in 2012 showed evidence of dementia but I have not seen that clinically and we will retest now, given that 4 years have passed and the picture should be more clear now.  Testing at that time also did not demonstrate depression, which has been very evident clinically.  Is agreeable to retesting. 5.  Paresthesias  -Has evidence of PN.  Used to have DM but better after gastric banding.  Wanted to recheck HgBA1C but daughter wants done through PCP office.    -increase gabapentin to 100 tid  -has additional  paresthesia in the right leg posteriorally below the knee that are in non dermatomal distribution.  Will do EMG. 6.  Headache.  -She is doing better with the help of Dr. Domingo Cocking.  She is on low dose gabapentin and as above, I increased that to help with paresthesias.

## 2014-10-13 ENCOUNTER — Other Ambulatory Visit: Payer: Self-pay | Admitting: Neurology

## 2014-10-14 ENCOUNTER — Other Ambulatory Visit: Payer: Self-pay | Admitting: Internal Medicine

## 2014-10-14 NOTE — Telephone Encounter (Signed)
Last filled 09/09/2014--please advise

## 2014-10-14 NOTE — Telephone Encounter (Signed)
Ok to phone in xanax 

## 2014-10-14 NOTE — Telephone Encounter (Signed)
Remeron refill requested. Per last office note- patient to remain on medication. Refill approved and sent to patient's pharmacy.

## 2014-10-15 NOTE — Telephone Encounter (Signed)
Rx called in to pharmacy. 

## 2014-10-16 DIAGNOSIS — R411 Anterograde amnesia: Secondary | ICD-10-CM | POA: Diagnosis not present

## 2014-10-30 DIAGNOSIS — R411 Anterograde amnesia: Secondary | ICD-10-CM | POA: Diagnosis not present

## 2014-11-07 ENCOUNTER — Ambulatory Visit (INDEPENDENT_AMBULATORY_CARE_PROVIDER_SITE_OTHER): Payer: Medicare Other | Admitting: Neurology

## 2014-11-07 ENCOUNTER — Telehealth: Payer: Self-pay | Admitting: Neurology

## 2014-11-07 DIAGNOSIS — M5417 Radiculopathy, lumbosacral region: Secondary | ICD-10-CM

## 2014-11-07 DIAGNOSIS — G609 Hereditary and idiopathic neuropathy, unspecified: Secondary | ICD-10-CM

## 2014-11-07 DIAGNOSIS — R202 Paresthesia of skin: Secondary | ICD-10-CM

## 2014-11-07 NOTE — Procedures (Signed)
[facility]  [place], [place]  [place], [place] [address] Tel: [phone] Fax:  [phone] Test Date:  11/07/2014  Patient: [name] DOB: May 06, 1946 Physician: Narda Amber  Sex: Female Height: 5\' 5"  Ref Phys: Alonza Bogus  ID#: [id number] Temp: 35.0C Technician: Laureen Ochs R. NCS T.   Patient Complaints: Patient is a 69 year old female here for evaluation of her legs and feet right worse than left tingling and numbness.  NCV & EMG Findings: Extensive electrodiagnostic testing of the right lower extremity and additional studies of the left shows:  1. Bilateral sural and superficial peroneal sensory responses are within normal limits. 2. Bilateral peroneal and tibial motor responses are within normal limits. 3. Bilateral H reflex studies are within normal limits. 4. Chronic motor axon loss changes are seen affecting L2-L5 myotomes on the right, without accompanied active denervation. These findings are not present in the left lower extremity.  Impression: 1. Chronic L2-L4 radiculopathy affecting the right lower extremity; moderate in degree electrically. 2. Chronic L5 radiculopathy affecting the right lower extremity; mild in degree electrically. 3. There is no evidence of a generalized sensorimotor polyneuropathy affecting the lower extremities.   ___________________________ Narda Amber    Nerve Conduction Studies Anti Sensory Summary Table   Stim Site NR Peak (ms) Norm Peak (ms) P-T Amp (V) Norm P-T Amp  Left Sup Peroneal Anti Sensory (Ant Lat Mall)  35C  12 cm    2.6 <4.6 5.6 >3  Right Sup Peroneal Anti Sensory (Ant Lat Mall)  35C  12 cm    2.5 <4.6 4.2 >3  Left Sural Anti Sensory (Lat Mall)  35C  Calf    3.3 <4.6 11.3 >3  Right Sural Anti Sensory (Lat Mall)  35C  Calf    3.2 <4.6 11.2 >3   Motor Summary Table   Stim Site NR Onset (ms) Norm Onset (ms) O-P Amp (mV) Norm O-P Amp Site1 Site2 Delta-0 (ms) Dist (cm) Vel (m/s) Norm Vel  (m/s)  Left Peroneal Motor (Ext Dig Brev)  35C  Ankle    3.7 <6.0 3.3 >2.5 B Fib Ankle 6.1 29.5 48 >40  B Fib    9.8  2.9  Poplt B Fib 1.7 7.5 44 >40  Poplt    11.5  2.9         Right Peroneal Motor (Ext Dig Brev)  35C  Ankle    3.9 <6.0 2.5 >2.5 B Fib Ankle 6.8 30.0 44 >40  B Fib    10.7  2.1  Poplt B Fib 1.4 8.5 61 >40  Poplt    12.1  2.0         Left Peroneal TA Motor (Tib Ant)  35C  Fib Head    2.7 <4.5 3.6 >3 Poplit Fib Head 1.5 8.0 53 >40  Poplit    4.2  3.4         Right Peroneal TA Motor (Tib Ant)  35C  Fib Head    3.9 <4.5 3.5 >3 Poplit Fib Head 1.3 8.0 62 >40  Poplit    5.2  3.5         Left Tibial Motor (Abd Hall Brev)  35C  Ankle    3.8 <6.0 8.7 >4 Knee Ankle 7.8 40.0 51 >40  Knee    11.6  7.1         Right Tibial Motor (Abd Hall Brev)  35C  Ankle    4.5 <6.0 9.0 >4 Knee Ankle  7.7 35.0 45 >40  Knee    12.2  6.4          H Reflex Studies   NR H-Lat (ms) Lat Norm (ms) L-R H-Lat (ms)  Left Tibial (Gastroc)  35C     32.52 <35 1.63  Right Tibial (Gastroc)  35C     34.15 <35 1.63   EMG   Side Muscle Ins Act Fibs Psw Fasc Number Recrt Dur Dur. Amp Amp. Poly Poly. Comment  Right AntTibialis Nml Nml Nml Nml 1- Mod-R Some 1+ Some 1+ Nml Nml N/A  Right Gastroc Nml Nml Nml Nml Nml Nml Nml Nml Nml Nml Nml Nml N/A  Right Flex Dig Long Nml Nml Nml Nml 1- Rapid Some 1+ Some 1+ Nml Nml N/A  Right RectFemoris Nml Nml Nml Nml 2- Rapid Some 1+ Some 1+ Nml Nml N/A  Right GluteusMed Nml Nml Nml Nml 1- Mod-R Some 1+ Nml Nml Nml Nml N/A  Right BicepsFemS Nml Nml Nml Nml Nml Nml Nml Nml Nml Nml Nml Nml N/A  Right Iliopsoas Nml Nml Nml Nml 1- Mod-R Some Nml Nml Nml Nml Nml N/A  Right AdductorLong Nml Nml Nml Nml 2- Rapid Some 1+ Some 1+ Nml Nml N/A  Left RectFemoris Nml Nml Nml Nml Nml Nml Nml Nml Nml Nml Nml Nml N/A  Left GluteusMed Nml Nml Nml Nml Nml Nml Nml Nml Nml Nml Nml Nml N/A  Left Flex Dig Long Nml Nml Nml Nml Nml Nml Nml Nml Nml Nml Nml Nml N/A  Left AntTibialis Nml  Nml Nml Nml Nml Nml Nml Nml Nml Nml Nml Nml N/A  Left Gastroc Nml Nml Nml Nml Nml Nml Nml Nml Nml Nml Nml Nml N/A      Waveforms:

## 2014-11-07 NOTE — Telephone Encounter (Signed)
-----   Message from [place], DO sent at 11/07/2014  3:57 PM EDT ----- Luvenia Starch, will you give the patient results please.  Chronic/old pinched nerves from back coming down the right leg

## 2014-11-07 NOTE — Telephone Encounter (Signed)
Patient made aware of results.  

## 2014-11-19 ENCOUNTER — Other Ambulatory Visit: Payer: Self-pay | Admitting: *Deleted

## 2014-11-19 ENCOUNTER — Other Ambulatory Visit: Payer: Self-pay | Admitting: Internal Medicine

## 2014-11-19 MED ORDER — ALPRAZOLAM 0.5 MG PO TABS: 0.5000 mg | ORAL_TABLET | Freq: Two times a day (BID) | ORAL | Status: DC

## 2014-11-19 NOTE — Telephone Encounter (Signed)
Rx called to pharmacy

## 2014-11-19 NOTE — Telephone Encounter (Signed)
Last filled #[id number] 10/14/14.  Patient has CPE scheduled 12/03/14.  She has taken her last pill.  Okay to refill?

## 2014-11-21 ENCOUNTER — Other Ambulatory Visit: Payer: Self-pay | Admitting: Neurology

## 2014-11-22 ENCOUNTER — Other Ambulatory Visit: Payer: Self-pay | Admitting: *Deleted

## 2014-11-22 MED ORDER — CARBIDOPA-LEVODOPA 25-100 MG PO TABS: ORAL_TABLET | ORAL | Status: DC

## 2014-11-22 NOTE — Telephone Encounter (Signed)
Rx sent 

## 2014-11-29 ENCOUNTER — Other Ambulatory Visit (INDEPENDENT_AMBULATORY_CARE_PROVIDER_SITE_OTHER): Payer: Medicare Other

## 2014-11-29 ENCOUNTER — Other Ambulatory Visit: Payer: Self-pay | Admitting: Family Medicine

## 2014-11-29 DIAGNOSIS — E782 Mixed hyperlipidemia: Secondary | ICD-10-CM

## 2014-11-29 LAB — COMPREHENSIVE METABOLIC PANEL
ALT: 8 U/L (ref 0–35)
AST: 15 U/L (ref 0–37)
Albumin: 3.6 g/dL (ref 3.5–5.2)
Alkaline Phosphatase: 62 U/L (ref 39–117)
BUN: 20 mg/dL (ref 6–23)
CO2: 32 mEq/L (ref 19–32)
Calcium: 10.8 mg/dL — ABNORMAL HIGH (ref 8.4–10.5)
Chloride: 101 mEq/L (ref 96–112)
Creatinine, Ser: 0.71 mg/dL (ref 0.40–1.20)
GFR: 86.52 mL/min (ref >60.00)
Glucose, Bld: 94 mg/dL (ref 70–99)
Potassium: 4.1 mEq/L (ref 3.5–5.1)
Sodium: 139 mEq/L (ref 135–145)
Total Bilirubin: 0.4 mg/dL (ref 0.2–1.2)
Total Protein: 6.5 g/dL (ref 6.0–8.3)

## 2014-11-29 LAB — CBC WITH DIFFERENTIAL/PLATELET
Basophils Absolute: 0 10*3/uL (ref 0.0–0.1)
Basophils Relative: 0.5 % (ref 0.0–3.0)
Eosinophils Absolute: 0.4 10*3/uL (ref 0.0–0.7)
Eosinophils Relative: 4.1 % (ref 0.0–5.0)
HCT: 39.5 % (ref 36.0–46.0)
Hemoglobin: 13.2 g/dL (ref 12.0–15.0)
Lymphocytes Relative: 25.1 % (ref 12.0–46.0)
Lymphs Abs: 2.4 10*3/uL (ref 0.7–4.0)
MCHC: 33.4 g/dL (ref 30.0–36.0)
MCV: 93.6 fl (ref 78.0–100.0)
Monocytes Absolute: 0.7 10*3/uL (ref 0.1–1.0)
Monocytes Relative: 7 % (ref 3.0–12.0)
Neutro Abs: 6 10*3/uL (ref 1.4–7.7)
Neutrophils Relative %: 63.3 % (ref 43.0–77.0)
Platelets: 237 10*3/uL (ref 150.0–400.0)
RBC: 4.22 Mil/uL (ref 3.87–5.11)
RDW: 14.4 % (ref 11.5–15.5)
WBC: 9.5 10*3/uL (ref 4.0–10.5)

## 2014-11-29 LAB — LIPID PANEL
Cholesterol: 167 mg/dL (ref 0–200)
HDL: 57.6 mg/dL (ref >39.00)
LDL Cholesterol: 74 mg/dL (ref 0–99)
NonHDL: 109.4
Total CHOL/HDL Ratio: 3
Triglycerides: 179 mg/dL — ABNORMAL HIGH (ref 0.0–149.0)
VLDL: 35.8 mg/dL (ref 0.0–40.0)

## 2014-11-29 LAB — TSH: TSH: 2.11 u[IU]/mL (ref 0.35–4.50)

## 2014-11-29 LAB — HEMOGLOBIN A1C: Hgb A1c MFr Bld: 6 % (ref 4.6–6.5)

## 2014-12-03 ENCOUNTER — Ambulatory Visit (INDEPENDENT_AMBULATORY_CARE_PROVIDER_SITE_OTHER): Payer: Medicare Other | Admitting: Family Medicine

## 2014-12-03 ENCOUNTER — Encounter: Payer: Self-pay | Admitting: Family Medicine

## 2014-12-03 VITALS — BP 126/78 | HR 77 | Temp 98.0°F | Ht 62.5 in | Wt 156.8 lb

## 2014-12-03 DIAGNOSIS — E1165 Type 2 diabetes mellitus with hyperglycemia: Secondary | ICD-10-CM | POA: Insufficient documentation

## 2014-12-03 DIAGNOSIS — E119 Type 2 diabetes mellitus without complications: Secondary | ICD-10-CM

## 2014-12-03 DIAGNOSIS — E782 Mixed hyperlipidemia: Secondary | ICD-10-CM | POA: Diagnosis not present

## 2014-12-03 DIAGNOSIS — Z0000 Encounter for general adult medical examination without abnormal findings: Secondary | ICD-10-CM

## 2014-12-03 DIAGNOSIS — Z66 Do not resuscitate: Secondary | ICD-10-CM

## 2014-12-03 DIAGNOSIS — Z1211 Encounter for screening for malignant neoplasm of colon: Secondary | ICD-10-CM

## 2014-12-03 DIAGNOSIS — F341 Dysthymic disorder: Secondary | ICD-10-CM | POA: Diagnosis not present

## 2014-12-03 HISTORY — DX: Type 2 diabetes mellitus with hyperglycemia: E11.65

## 2014-12-03 NOTE — Progress Notes (Signed)
Pre visit review using our clinic review tool, if applicable. No additional management support is needed unless otherwise documented below in the visit note. 

## 2014-12-03 NOTE — Assessment & Plan Note (Signed)
Well controlled on current dose of Zocor. No changes made.

## 2014-12-03 NOTE — Assessment & Plan Note (Signed)
Not well controlled. >15 minutes spent in face to face time with patient, >50% spent in counselling or coordination of care. Discussed referral to psychiatrist but she declines.  She thinks she can "just talk to my cat." No SI or HI. Daughter is comfortable with her current rxs.

## 2014-12-03 NOTE — Addendum Note (Signed)
Addended by: Ellamae Sia on: 12/03/2014 09:49 AM   Modules accepted: Orders

## 2014-12-03 NOTE — Assessment & Plan Note (Signed)
Diet controlled.  

## 2014-12-03 NOTE — Progress Notes (Signed)
Subjective:    Patient ID: [name], female    DOB: 1945/08/10, 69 y.o.   MRN: [id number]  HPI  69 yo pleasant female here with her daughter for AMW visit and follow up of chronic medical conditions.  I have personally reviewed the Medicare Annual Wellness questionnaire and have noted 1. The patient's medical and social history 2. Their use of alcohol, tobacco or illicit drugs 3. Their current medications and supplements 4. The patient's functional ability including ADL's, fall risks, home safety risks and hearing or visual impairment. 5. Diet and physical activities 6. Evidence for depression or mood disorders  Remote h/o hysterectomy Mammogram 02/14/14 Td 06/21/12 Zostavax 04/18/08 Pneumovax 04/16/10 prevnar 13 11/26/13 Last eye exam 12/07/13  End of life wishes discussed and updated in Social History.  The roster of all physicians providing medical care to patient - is listed in the CareTeams section of the chart.   PSP- followed by Dr. Carles Collet.   Referred for neuropsych testing- told her that she needs to see a psychiatrist for depression.  On Remeron.  No longer seeing Dr. Cheryln Manly.  Was doing well but her sister died 2 weeks ago.  Feels she is coping ok- sad but feels she is just grieving.  No SI or HI.  HLD- decreased zocor to 20 mg daily- well controlled on lower dose.  Lab Results  Component Value Date   CHOL 167 11/29/2014   HDL 57.60 11/29/2014   LDLCALC 74 11/29/2014   TRIG 179.0* 11/29/2014   CHOLHDL 3 11/29/2014   Lab Results  Component Value Date   CREATININE 0.71 11/29/2014   DM- diet controlled.  Has appt for eye exam later this month. Lab Results  Component Value Date   HGBA1C 6.0 11/29/2014     Patient Active Problem List   Diagnosis Date Noted  . History of recurrent UTIs 06/13/2014  . DNR (do not resuscitate) 11/23/2012  . Seizures 04/03/2012  . Dysphagia 12/07/2011  . Orthostasis 12/07/2011  . Bell's palsy   . Loss of perception for  taste 04/07/2011  . Loss, sense of, smell 04/07/2011  . Hypersomnia 01/18/2011  . OSTEOPENIA 05/21/2010  . ANXIETY DEPRESSION 04/16/2010  . OBESITY, MORBID 02/16/2007  . AODM 01/31/2007  . HYPERLIPIDEMIA, MIXED 01/31/2007  . HYPERTENSION, BENIGN ESSENTIAL 01/31/2007   Past Medical History  Diagnosis Date  . Dysmetabolic syndrome X   . Personal history of thrombophlebitis   . Essential hypertension, benign   . Morbid obesity   . Mixed hyperlipidemia   . Depressive disorder, not elsewhere classified   . Bell's palsy     1985  . Adjustment disorder with depressed mood   . DVT (deep venous thrombosis), left "early 2000's"    RLE  . Syncope and collapse 12/06/11    "this am; I pass out fairly often"  . Type II or unspecified type diabetes mellitus without mention of complication, not stated as uncontrolled 12/06/11    "not anymore; had lap band OR"  . Neurodegenerative gait disorder   . ENIDPOEU(235.3) 05/2011    "pretty often since they put me on Parkinson's medicine"  . Parkinson disease     "they think that's what it is"  . DEMENTIA   . 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 Anxiety   . History of shingles     "as a teen and in my 62's"   Past Surgical History  Procedure Laterality Date  .  Tonsillectomy and adenoidectomy  1970  . Bladder tack  11/1995  . Septoplasty  08/1998    with antral window   . Knee arthroscopy  12/12/2002    right   . Fracture surgery  02/09/2006    bilateral elbows  . Fracture surgery      right knee  . Fracture surgery      tib plateau  . Breast cyst aspiration  12/1998    bilaterally  . Breast biopsy  05/20/2000    bilaterally  . Tubal ligation  1970's  . Cataract extraction w/ intraocular lens  implant, bilateral    . Laparoscopic gastric banding  2008  . Cardiac catheterization  2004   History  Substance Use Topics  . Smoking status: Never Smoker   . Smokeless tobacco: Never Used  . Alcohol Use:  No     Comment: none   Family History  Problem Relation Age of Onset  . COPD Father   . Diabetes Sister   . Vision loss Sister     legally blind  . Asthma Mother   . Heart disease Father    Allergies  Allergen Reactions  . Codeine Sulfate     REACTION: hallucinations  . Penicillins Anaphylaxis, Swelling and Rash  . Wellbutrin [Bupropion] Other (See Comments)    Seizures and hallucinations   Current Outpatient Prescriptions on File Prior to Visit  Medication Sig Dispense Refill  . ALPRAZolam (XANAX) 0.5 MG tablet Take 1 tablet (0.5 mg total) by mouth 2 (two) times daily. 60 tablet 0  . aspirin 81 MG tablet Take 81 mg by mouth daily.     Marland Kitchen Bioflavonoid Products (GRAPE SEED PO) Take by mouth daily.    . Calcium Carbonate-Vitamin D (CALCIUM + D PO) Take 600 mg by mouth daily.    . carbidopa-levodopa (SINEMET IR) 25-100 MG per tablet TAKE 3 TABLETS BY MOUTH 3 (THREE) TIMES DAILY. KEEP APPOINTMENT ON 05/07/13 FOR ADDITIONAL REFILLS 270 tablet 5  . cetirizine (ZYRTEC) 10 MG tablet Take 10 mg by mouth daily.    . Cholecalciferol (VITAMIN D3) 1000 UNITS CAPS Take 1 capsule by mouth daily.      . feeding supplement (ENSURE COMPLETE) LIQD Take 237 mLs by mouth 2 (two) times daily between meals. 1 Bottle 11  . FLUoxetine (PROZAC) 20 MG capsule TAKE ONE CAPSULE BY MOUTH EVERY DAY 90 capsule 1  . gabapentin (NEURONTIN) 100 MG capsule Take 1 capsule (100 mg total) by mouth 3 (three) times daily. 270 capsule 1  . losartan-hydrochlorothiazide (HYZAAR) 50-12.5 MG per tablet TAKE 1 TABLET BY MOUTH DAILY 90 tablet 1  . mirtazapine (REMERON) 30 MG tablet TAKE 1 TABLET BY MOUTH AT BEDTIME 30 tablet 5  . Multiple Vitamins-Minerals (CENTRUM SILVER ADULT 50+) TABS Take by mouth.    . nitrofurantoin, macrocrystal-monohydrate, (MACROBID) 100 MG capsule Take 1 capsule (100 mg total) by mouth 2 (two) times daily. 10 capsule 0  . Ranitidine HCl ([facility] RANITIDINE PO) Take one by mouth daily, patient unsure of  strength    . simvastatin (ZOCOR) 20 MG tablet Take 1 tablet (20 mg total) by mouth at bedtime. 90 tablet 3  . vitamin B-12 (CYANOCOBALAMIN) 1000 MCG tablet Take 1,000 mcg by mouth daily.       No current facility-administered medications on file prior to visit.   The PMH, PSH, Social History, Family History, Medications, and allergies have been reviewed in [facility], and have been updated if relevant.    Review of Systems  See HPI    Patient reports no  vision/ hearing changes,anorexia, weight change, fever ,adenopathy, persistant / recurrent hoarseness, swallowing issues, chest pain, edema,persistant / recurrent cough, hemoptysis, dyspnea(rest, exertional, paroxysmal nocturnal), gastrointestinal  bleeding (melena, rectal bleeding), abdominal pain, excessive heart burn, focal weakness, severe memory loss, concerning skin lesions, depression, anxiety, abnormal bruising/bleeding, major joint swelling, breast masses or abnormal vaginal bleeding.   + dysuria and increased urinary frequecy (h/o recurrent UTIs).  Objective:   Physical Exam BP 126/78 mmHg  Pulse 77  Temp(Src) 98 F (36.7 C) (Oral)  Ht 5' 2.5" (1.588 m)  Wt 156 lb 12 oz (71.101 kg)  BMI 28.20 kg/m2  SpO2 95%  General: Well-developed,well-nourished,in no acute distress; alert,appropriate and cooperative throughout examination, minimally overweight.  Mouth: Oral mucosa and oropharynx without lesions or exudates. Teeth in good repair. Left facial paralysis unchanged.  Neck: No deformities, masses, or tenderness noted.  No bruits  Lungs: Normal respiratory effort, chest expands symmetrically. Lungs are clear to auscultation, no crackles or wheezes.  Heart: Normal rate and regular rhythm. S1 and S2 normal without gallop, murmur, click, rub or other extra sounds.  Abdomen: Bowel sounds positive,abdomen soft and non-tender without masses, organomegaly or hernias noted. NO CVA or suprapubic tenderness.  Extremities: No clubbing,  cyanosis, edema, or deformity noted with normal full range of motion of all joints.  Neurologic: No cranial nerve deficits noted. Station and gait are normal. Sensory, motor and coordinative functions appear intact.  Psych: Cognition and judgment appear intact. Alert and cooperative with normal attention span and concentration. 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 No skin breakdown No calluses  Normal DP pulses Normal sensation to light touch and monofilament Nails normal     Assessment & Plan:

## 2014-12-03 NOTE — Assessment & Plan Note (Signed)
The patients weight, height, BMI and visual acuity have been recorded in the chart I have made referrals, counseling and provided education to the patient based review of the above and I have provided the pt with a written personalized care plan for preventive services.  Declines colonoscopy, agrees to IFOB- ordered today.

## 2014-12-13 ENCOUNTER — Other Ambulatory Visit (INDEPENDENT_AMBULATORY_CARE_PROVIDER_SITE_OTHER): Payer: Medicare Other

## 2014-12-13 DIAGNOSIS — Z1211 Encounter for screening for malignant neoplasm of colon: Secondary | ICD-10-CM

## 2014-12-13 LAB — FECAL OCCULT BLOOD, IMMUNOCHEMICAL: Fecal Occult Bld: NEGATIVE

## 2014-12-16 ENCOUNTER — Other Ambulatory Visit: Payer: Self-pay | Admitting: Family Medicine

## 2014-12-16 NOTE — Telephone Encounter (Signed)
Last f/u appt 11/2014-CPE

## 2014-12-16 NOTE — Telephone Encounter (Signed)
Rx called in to requested pharmacy 

## 2014-12-17 ENCOUNTER — Encounter: Payer: Self-pay | Admitting: *Deleted

## 2015-01-07 DIAGNOSIS — E119 Type 2 diabetes mellitus without complications: Secondary | ICD-10-CM | POA: Diagnosis not present

## 2015-01-16 ENCOUNTER — Other Ambulatory Visit: Payer: Self-pay

## 2015-01-16 MED ORDER — ALPRAZOLAM 0.5 MG PO TABS: 0.5000 mg | ORAL_TABLET | Freq: Two times a day (BID) | ORAL | Status: DC | PRN

## 2015-01-16 NOTE — Telephone Encounter (Signed)
[name] pts daughter left v/m requesting [facility] [place] request cb;[facility]. Pt last seen annual exam 12/03/2014 and rx last printed # 42 on 12/16/14.

## 2015-01-16 NOTE — Telephone Encounter (Signed)
Rx called in to requested pharmacy 

## 2015-01-17 ENCOUNTER — Ambulatory Visit (INDEPENDENT_AMBULATORY_CARE_PROVIDER_SITE_OTHER): Payer: Medicare Other | Admitting: Primary Care

## 2015-01-17 ENCOUNTER — Encounter: Payer: Self-pay | Admitting: Primary Care

## 2015-01-17 VITALS — BP 114/64 | HR 72 | Temp 97.8°F | Ht 62.5 in | Wt 158.4 lb

## 2015-01-17 DIAGNOSIS — R8290 Unspecified abnormal findings in urine: Secondary | ICD-10-CM

## 2015-01-17 MED ORDER — SULFAMETHOXAZOLE-TRIMETHOPRIM 800-160 MG PO TABS
1.0000 | ORAL_TABLET | Freq: Two times a day (BID) | ORAL | Status: DC
Start: 2015-01-17 — End: 2015-10-22

## 2015-01-17 NOTE — Patient Instructions (Signed)
Start Bactrim DS antibiotics. Take 1 tablet by mouth twice daily for 5 days.  Increase intake of water.  Do not eat the protein bar and drink ensure at the same time as this is likely causing diarrhea.  Apply hydrocortisone cream to back and arms for itching. Do not apply to face.  It was nice meeting you!  Urinary Tract Infection Urinary tract infections (UTIs) can develop anywhere along your urinary tract. Your urinary tract is your body's drainage system for removing wastes and extra water. Your urinary tract includes two kidneys, two ureters, a bladder, and a urethra. Your kidneys are a pair of bean-shaped organs. Each kidney is about the size of your fist. They are located below your ribs, one on each side of your spine. CAUSES Infections are caused by microbes, which are microscopic organisms, including fungi, viruses, and bacteria. These organisms are so small that they can only be seen through a microscope. Bacteria are the microbes that most commonly cause UTIs. SYMPTOMS  Symptoms of UTIs may vary by age and gender of the patient and by the location of the infection. Symptoms in young women typically include a frequent and intense urge to urinate and a painful, burning feeling in the bladder or urethra during urination. Older women and men are more likely to be tired, shaky, and weak and have muscle aches and abdominal pain. A fever may mean the infection is in your kidneys. Other symptoms of a kidney infection include pain in your back or sides below the ribs, nausea, and vomiting. DIAGNOSIS To diagnose a UTI, your caregiver will ask you about your symptoms. Your caregiver also will ask to provide a urine sample. The urine sample will be tested for bacteria and white blood cells. White blood cells are made by your body to help fight infection. TREATMENT  Typically, UTIs can be treated with medication. Because most UTIs are caused by a bacterial infection, they usually can be treated with  the use of antibiotics. The choice of antibiotic and length of treatment depend on your symptoms and the type of bacteria causing your infection. HOME CARE INSTRUCTIONS  If you were prescribed antibiotics, take them exactly as your caregiver instructs you. Finish the medication even if you feel better after you have only taken some of the medication.  Drink enough water and fluids to keep your urine clear or pale yellow.  Avoid caffeine, tea, and carbonated beverages. They tend to irritate your bladder.  Empty your bladder often. Avoid holding urine for long periods of time.  Empty your bladder before and after sexual intercourse.  After a bowel movement, women should cleanse from front to back. Use each tissue only once. SEEK MEDICAL CARE IF:   You have back pain.  You develop a fever.  Your symptoms do not begin to resolve within 3 days. SEEK IMMEDIATE MEDICAL CARE IF:   You have severe back pain or lower abdominal pain.  You develop chills.  You have nausea or vomiting.  You have continued burning or discomfort with urination. MAKE SURE YOU:   Understand these instructions.  Will watch your condition.  Will get help right away if you are not doing well or get worse. Document Released: 04/21/2005 Document Revised: 01/11/2012 Document Reviewed: 08/20/2011 [facility] Patient Information 2015 [place], [place]. This information is not intended to replace advice given to you by your health care provider. Make sure you discuss any questions you have with your health care provider.

## 2015-01-17 NOTE — Addendum Note (Signed)
Addended by: Jacqualin Combes on: 01/17/2015 04:38 PM   Modules accepted: Orders

## 2015-01-17 NOTE — Progress Notes (Signed)
Subjective:    Patient ID: [name], female    DOB: 17-Aug-1945, 69 y.o.   MRN: [id number]  HPI  [name] is a 69 year old female who presents today with multiple complaints. She has a history of dementia and daughter is present during appointment.  1) Rash: Present to back and bilateral arms x 1 week with itching. Her daughter pulled of 2 ticks that were attached to her back 1 week ago. Ticks were small in size. Denies body aches, fevers, headaches, chills. She's been taking benadryl with some relief.  2) Foul urine odor: Present to urine early this week. She has a history of UTI's and daughter is able to tell when she starts to develop. Denies dysuria, urgency, frequency but she doesn't typically develop these symptoms with a UTI.  3) Diarrhea: 3 episodes. Once every morning for the past 3 mornings after breakfast. She's recently started eating new protein bars with an ensure drink every morning. Denies nausea, vomiting, and abdominal pain. She does not experience diarrhea at any other time of the day.  Review of Systems  Constitutional: Negative for fever.  Respiratory: Negative for shortness of breath.   Cardiovascular: Negative for chest pain.  Gastrointestinal: Positive for diarrhea. Negative for abdominal pain.  Genitourinary: Negative for dysuria, urgency, frequency, hematuria and vaginal discharge.  Skin: Positive for rash.  Neurological: Negative for weakness.       Past Medical History  Diagnosis Date  . Dysmetabolic syndrome X   . Personal history of thrombophlebitis   . Essential hypertension, benign   . Morbid obesity   . Mixed hyperlipidemia   . Depressive disorder, not elsewhere classified   . Bell's palsy     1985  . Adjustment disorder with depressed mood   . DVT (deep venous thrombosis), left "early 2000's"    RLE  . Syncope and collapse 12/06/11    "this am; I pass out fairly often"  . Type II or unspecified type diabetes mellitus without  mention of complication, not stated as uncontrolled 12/06/11    "not anymore; had lap band OR"  . Neurodegenerative gait disorder   . KVQQVZDG(387.5) 05/2011    "pretty often since they put me on Parkinson's medicine"  . Parkinson disease     "they think that's what it is"  . DEMENTIA   . 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 Anxiety   . History of shingles     "as a teen and in my 48's"    History   Social History  . Marital Status: Widowed    Spouse Name: N/A  . Number of Children: 2  . Years of Education: N/A   Occupational History  . 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 Smoking status: Never Smoker   . Smokeless tobacco: Never Used  . Alcohol Use: No     Comment: none  . Drug Use: No  . Sexual Activity: No   Other Topics Concern  . Not on file   Social History Narrative   Lives with daughter and granddaughter.   Daughter is HPOA.   DNR.    Past Surgical History  Procedure Laterality Date  . Tonsillectomy and adenoidectomy  1970  . Bladder tack  11/1995  . Septoplasty  08/1998    with antral window   . Knee arthroscopy  12/12/2002    right   .  Fracture surgery  02/09/2006    bilateral elbows  . Fracture surgery      right knee  . Fracture surgery      tib plateau  . Breast cyst aspiration  12/1998    bilaterally  . Breast biopsy  05/20/2000    bilaterally  . Tubal ligation  1970's  . Cataract extraction w/ intraocular lens  implant, bilateral    . Laparoscopic gastric banding  2008  . Cardiac catheterization  2004    Family History  Problem Relation Age of Onset  . COPD Father   . Diabetes Sister   . Vision loss Sister     legally blind  . Asthma Mother   . Heart disease Father     Allergies  Allergen Reactions  . Codeine Sulfate     REACTION: hallucinations  . Penicillins Anaphylaxis, Swelling and Rash  . Wellbutrin [Bupropion] Other (See Comments)     Seizures and hallucinations    Current Outpatient Prescriptions on File Prior to Visit  Medication Sig Dispense Refill  . ALPRAZolam (XANAX) 0.5 MG tablet Take 1 tablet (0.5 mg total) by mouth 2 (two) times daily as needed. 60 tablet 0  . aspirin 81 MG tablet Take 81 mg by mouth daily.     Marland Kitchen Bioflavonoid Products (GRAPE SEED PO) Take by mouth daily.    . Calcium Carbonate-Vitamin D (CALCIUM + D PO) Take 600 mg by mouth daily.    . carbidopa-levodopa (SINEMET IR) 25-100 MG per tablet TAKE 3 TABLETS BY MOUTH 3 (THREE) TIMES DAILY. KEEP APPOINTMENT ON 05/07/13 FOR ADDITIONAL REFILLS 270 tablet 5  . cetirizine (ZYRTEC) 10 MG tablet Take 10 mg by mouth daily.    . Cholecalciferol (VITAMIN D3) 1000 UNITS CAPS Take 1 capsule by mouth daily.      . feeding supplement (ENSURE COMPLETE) LIQD Take 237 mLs by mouth 2 (two) times daily between meals. 1 Bottle 11  . FLUoxetine (PROZAC) 20 MG capsule TAKE ONE CAPSULE BY MOUTH EVERY DAY 90 capsule 1  . gabapentin (NEURONTIN) 100 MG capsule Take 1 capsule (100 mg total) by mouth 3 (three) times daily. 270 capsule 1  . losartan-hydrochlorothiazide (HYZAAR) 50-12.5 MG per tablet TAKE 1 TABLET BY MOUTH DAILY 90 tablet 1  . mirtazapine (REMERON) 30 MG tablet TAKE 1 TABLET BY MOUTH AT BEDTIME 30 tablet 5  . Multiple Vitamins-Minerals (CENTRUM SILVER ADULT 50+) TABS Take by mouth.    . nitrofurantoin, macrocrystal-monohydrate, (MACROBID) 100 MG capsule Take 1 capsule (100 mg total) by mouth 2 (two) times daily. 10 capsule 0  . Ranitidine HCl ([facility] RANITIDINE PO) Take one by mouth daily, patient unsure of strength    . simvastatin (ZOCOR) 20 MG tablet Take 1 tablet (20 mg total) by mouth at bedtime. 90 tablet 3  . vitamin B-12 (CYANOCOBALAMIN) 1000 MCG tablet Take 1,000 mcg by mouth daily.       No current facility-administered medications on file prior to visit.    BP 114/64 mmHg  Pulse 72  Temp(Src) 97.8 F (36.6 C) (Oral)  Ht 5' 2.5" (1.588 m)  Wt 158 lb  6.4 oz (71.85 kg)  BMI 28.49 kg/m2  SpO2 98%    Objective:   Physical Exam  Cardiovascular: Normal rate and regular rhythm.   Pulmonary/Chest: Effort normal and breath sounds normal.  Abdominal: Soft. Bowel sounds are normal. There is no tenderness. There is no CVA tenderness.  Neurological: She is alert.  Skin: Skin is warm and dry.  Few  small red, flat rashes measuring <0.5cm present to right posterior forearm. 2 healing tick bites without erythema/rash to back.          Assessment & Plan:  Rash:  Small. Does not resemble ezcema or scabies.  Hydrocortisone cream BID for itching. Benadryl at night. Follow up PRN.  Foul smelling urine:  UA: moderate leuks, no nitrites, blood, glucose. Will send culture Start Bactrim DS BID x 5 days. Follow up PRN.  Diarrhea:  Present for past 3 mornings about 30 minutes after consuming new protein bars with ensure drinks. Separate protein bars from ensure drinks as this is likely the cause. Denies vomiting, fevers, chills. No other diarrhea present throughout the day. Follow up if diarrhea persists after reduction in protein bars and ensure drinks.

## 2015-01-19 LAB — URINE CULTURE
Colony Count: NO GROWTH
Organism ID, Bacteria: NO GROWTH

## 2015-01-22 ENCOUNTER — Other Ambulatory Visit: Payer: Self-pay | Admitting: Family Medicine

## 2015-02-10 ENCOUNTER — Other Ambulatory Visit: Payer: Self-pay | Admitting: Family Medicine

## 2015-02-11 NOTE — Telephone Encounter (Signed)
Rx called in to requested pharmacy 

## 2015-02-11 NOTE — Telephone Encounter (Signed)
Last f/u appt 11/2014-CPE

## 2015-03-03 ENCOUNTER — Other Ambulatory Visit: Payer: Self-pay | Admitting: Family Medicine

## 2015-03-21 ENCOUNTER — Other Ambulatory Visit: Payer: Self-pay

## 2015-03-21 MED ORDER — ALPRAZOLAM 0.5 MG PO TABS: 0.5000 mg | ORAL_TABLET | Freq: Two times a day (BID) | ORAL | Status: DC | PRN

## 2015-03-21 NOTE — Telephone Encounter (Signed)
V/M left requesting refill alprazolam to [facility]; [facility] and now pt is out of med. Request cb.  Last seen 12/03/14 and rx last refilled # 60 on 02/11/15. Dr Deborra Medina out of office but will send request to Dr Deborra Medina and Webb Silversmith NP.

## 2015-03-21 NOTE — Telephone Encounter (Signed)
Medication phoned to Margaretha Sheffield at [facility] as instructed.Jacqueline notified refill done.

## 2015-04-07 ENCOUNTER — Other Ambulatory Visit: Payer: Self-pay | Admitting: Neurology

## 2015-04-07 NOTE — Telephone Encounter (Signed)
Remeron refill requested. Per last office note- patient to remain on medication. Refill approved and sent to patient's pharmacy.   

## 2015-04-08 ENCOUNTER — Other Ambulatory Visit: Payer: Self-pay | Admitting: Neurology

## 2015-04-08 NOTE — Telephone Encounter (Signed)
Gabapentin refill requested. Per last office note- patient to remain on medication. Refill approved and sent to patient's pharmacy.   

## 2015-04-09 ENCOUNTER — Other Ambulatory Visit: Payer: Self-pay | Admitting: Family Medicine

## 2015-04-09 DIAGNOSIS — R921 Mammographic calcification found on diagnostic imaging of breast: Secondary | ICD-10-CM

## 2015-04-10 ENCOUNTER — Ambulatory Visit (INDEPENDENT_AMBULATORY_CARE_PROVIDER_SITE_OTHER): Payer: Medicare Other | Admitting: Neurology

## 2015-04-10 ENCOUNTER — Encounter: Payer: Self-pay | Admitting: Neurology

## 2015-04-10 VITALS — BP 98/60 | HR 80 | Ht 61.0 in | Wt 160.0 lb

## 2015-04-10 DIAGNOSIS — G214 Vascular parkinsonism: Secondary | ICD-10-CM

## 2015-04-10 DIAGNOSIS — M5417 Radiculopathy, lumbosacral region: Secondary | ICD-10-CM

## 2015-04-10 NOTE — Progress Notes (Signed)
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  This patient is accompanied in the office by her child who supplements the history.      She is on carbidopa/levodopa 25/100, 3 tablets 3 times per day.  She is doing well on this medication, without side effects.  She had one fall the day before Thanksgiving.  She was in a parking lot of a restaurant and tripped over something(curb).  She fx her nose and had to have her lip stitched up.    Since then, however, she has done very well.  Her daughter reports that she has been very stable on her feet.  Last visit, she was complaining of a decreased appetite and increased anxiety.  Therefore, I increased her Remeron to 30 mg at night.  This helped with both sleep and appetite and she is very pleased.  She also has a hx of seizure, likely secondary to Wellbutrin.  Last visit, her keppra was d/c.  She has been seizure free since that time.  However, mood and depression continue to be an issue.  I sent her to Dr. Cheryln Manly but she didn't follow up.  She states that she cannot talk to him without her daughter present.  Her biggest issue is that her granddaughter is a Ship broker and she lives with the patient.  Apparently, the majority of the house is clean and safe with the exception of the granddaughters room.  The patient states that the mess just drives her crazy and she cannot stop thinking about it.  Her granddaughter goes to community college and her leaving the house is not an option.  04/03/13 update:  [name].  She was supposed to f/u with Dr Cheryln Manly due to significant anxiety and depression and she cancelled her 3/4, 3/13 and 3/27 visits and never r/s.  She has not seen him since 10/13.  She states that she is just "not going to talk with someone."  She, however, is doing some better in that regard.  Much of her stress surrounds her granddaughter, who lives with  her and is a Ship broker.  The patient states that her granddaughter has now admitted to this, but to seek help.  They were able to get some of the room cleaned up a while ago and that seemed to really help the patient's mental state.  However, the room has reconnected and is now just as it previously was.  She also reports a significant amount of stress over her sister who has end-stage Alzheimer's dementia.  Her sister no longer recognizes her, and the patient realizes when she goes around her sister, it makes her symptoms much worse.  She feels that her memory is getting worse, but nothing like her sisters.  She also thinks that her speech is getting worse.    She is having some headaches, similar to what she had in the past, prior to starting on the Levodopa.  It is over the L forehead, but not in the eye.  It is pounding.  It is daily.  It does not cause n/v or cause photophobia or phonophobia.  It makes her feel "drained."  It does not prevent her from doing things.  There are no lateralizing paresthesias with the headaches.  There are no vision changes.  07/30/13 update:  The pt is on carbidopa/levodopa 25/100 three tablets three times per day.  She is doing well in that  regard.  She did not go to PT because of transportation issues.  She was started on zanaflex last visit for HA and was also started on the exelon patch.  Interestingly, she states that the exelon patch caused headaches but it was started after the headache.  Nonetheless, she stopped it and still has headache.  It starts in the L forehead and radiates over the L side of the head.  She still thinks that the patch made them worse however and also thinks that it caused UTI's.  She is still on zanaflex but isn't sure its helping.  No falls/hallucination/syncope/lightheadedness.  11/27/13 update:  Patient is following up today in the movement disorder clinic.  She is currently on carbidopa/levodopa 25/100, 3 tablets by mouth 3 times per day.  Since our  last visit, I did get a note from Dr. Domingo Cocking at the headache clinic.  He started the patient on Indocin 25 mg 3 times a day and she was told to titrate to 75 mg 3 times a day.  She states that she had SE with that and she was changed to gabapentin 100 mg and that is helping.  He also scheduled her for trigger point injections and an MRI brain.  She did not care for the trigger point injections although they did help.  She has f/u in June.  Walking seems to have been a little slower, but no falls.  Her daughter is with her today and feels that walking has been more tenuous.   No hallucinations.  No diplopia but is having blurry vision.  Does have vision appt in [facility] in few weeks.  05/07/14 update:   Patient is following up today in the movement disorder clinic.  She is currently on carbidopa/levodopa 25/100, 3 tablets by mouth 3 times per day.  Daughter states that she often forgets middle of the day dose but pt denies that.  She takes it at 9-10 am on awakening, 3 pm and bedtime.  She and her daughter both think that it is helpful.   No diplopia.  No falls.  She saw Dr. Domingo Cocking in June and I got a note from him.  She had more trigger point injections.  She is doing really well from a headache standpoint.  She doesn't have to return to him for 6 months (saw him in September).  She continues on remeron for her depression and mood.  She states that she hates that she is confined to the home and depression is tied into that.  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  She then asks if her daughter can come back to the room for the discussion and she did.   She did do therapy with [facility] and she thought that it went well.   10/08/14 update:   Patient is following up today in the movement disorder clinic. She is accompanied by her daughter who supplements the hx.  She is currently on carbidopa/levodopa 25/100, 3 tablets  by mouth 3 times per day.  C/o memory loss.  States that her daughter will tell her to go to the kitchen to get things and she will get there and not be able to remember.  Her daughter thinks that she jumps subjects.  Also c/o feet paresthesias.  Worries that DM has "returned" as used to be diabetic but not anymore.  Last A1C in April was 6.0.  Also  states that right leg below the knee posteriorally she is having paresthesias.  Started after she bent over one day and the knee "popped."  No LBP.  On gabapentin but that is for headache, which is well controlled.  She has been headache free for a long time.  Received a note from Dr. Domingo Cocking recently and pt told to continue meds.    04/10/15 update:  The patient is following up today.  She is on carbidopa/levodopa 25/100, 3 tablets 3 times per day.  Because I was not convinced that the patient had a dementia but her previous neuropsych test years ago said that she did and because some of the patient's depression surrounds the fact that her daughter will not let her drive because of this potential diagnosis, we repeated neuropsych testing.  While there was evidence of cognitive change due to depression, she did not meet the criteria for the diagnosis of dementia.  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  It was recommended that she undergo psychiatric evaluation and treatment, but she refused.  She was again given a brochure for the driving evaluator in [place] to have a driving evaluation.  She reports that her depression was fairly well under control until the end of 2022/10/27 when her sister died and she has been appropriately grieving.  She did have an EMG done since last visit.  This was done on 11/07/2014 There was no evidence of a large fiber peripheral neuropathy, but there was evidence of a chronic, moderate right L2-L4 radiculopathy and a chronic, mild L5 radiculopathy on the right.  She fell one time getting out  of the chair as the right leg gave out.  She also rolled out of the bed at night one time.   She states that she was dreaming.   Fortunately, she did not get hurt with either of these.    Neuroimaging has previously been performed.   It was done on 09/13/2013 and I reviewed these films.  There was atrophy and moderate small vessel disease.  MRI brain,09/13/13  FINDINGS:  Advanced cerebral and cerebellar atrophy. Chronic microvascular  ischemic change affecting the periventricular and subcortical white  matter as well as the brainstem above moderate to advanced degree.  No acute stroke, acute hemorrhage, mass lesion, or extra-axial  fluid. Hydrocephalus ex vacuo. No features to suggest normal  pressure hydrocephalus.  Post infusion, no abnormal enhancement of the brain or meninges. No  acute or reversible cause of dementia is evident.  Pituitary, pineal, and cerebellar tonsils unremarkable. No upper  cervical lesions. Flow voids are maintained throughout the carotid,  basilar, and vertebral arteries. There are no areas of chronic  hemorrhage. Visualized calvarium, skull base, and upper cervical  osseous structures unremarkable. Scalp and extracranial soft  tissues, orbits, sinuses, and mastoids show no acute process.  Similar appearance to priors.  IMPRESSION:  Atrophy and small vessel disease. No acute intracranial findings.    PREVIOUS MEDICATIONS: Sinemet  ALLERGIES:   Allergies  Allergen Reactions  . Codeine Sulfate     REACTION: hallucinations  . Penicillins Anaphylaxis, Swelling and Rash  . Wellbutrin [Bupropion] Other (See Comments)    Seizures and hallucinations    CURRENT MEDICATIONS:  Current Outpatient Prescriptions on File Prior to Visit  Medication Sig Dispense Refill  . ALPRAZolam (XANAX) 0.5 MG tablet Take 1 tablet (0.5 mg total) by mouth 2 (two) times daily as needed. 60 tablet 0  . aspirin 81 MG tablet  Take 81 mg by mouth daily.     Marland Kitchen Bioflavonoid Products  (GRAPE SEED PO) Take by mouth daily.    . Calcium Carbonate-Vitamin D (CALCIUM + D PO) Take 600 mg by mouth daily.    . carbidopa-levodopa (SINEMET IR) 25-100 MG per tablet TAKE 3 TABLETS BY MOUTH 3 (THREE) TIMES DAILY. KEEP APPOINTMENT ON 05/07/13 FOR ADDITIONAL REFILLS 270 tablet 5  . cetirizine (ZYRTEC) 10 MG tablet Take 10 mg by mouth daily.    . Cholecalciferol (VITAMIN D3) 1000 UNITS CAPS Take 1 capsule by mouth daily.      . feeding supplement (ENSURE COMPLETE) LIQD Take 237 mLs by mouth 2 (two) times daily between meals. 1 Bottle 11  . FLUoxetine (PROZAC) 20 MG capsule TAKE ONE CAPSULE BY MOUTH EVERY DAY 90 capsule 1  . gabapentin (NEURONTIN) 100 MG capsule TAKE 1 CAPSULE (100 MG TOTAL) BY MOUTH 3 (THREE) TIMES DAILY. 270 capsule 1  . losartan-hydrochlorothiazide (HYZAAR) 50-12.5 MG per tablet TAKE 1 TABLET BY MOUTH DAILY 90 tablet 3  . mirtazapine (REMERON) 30 MG tablet TAKE 1 TABLET BY MOUTH AT BEDTIME 30 tablet 5  . Multiple Vitamins-Minerals (CENTRUM SILVER ADULT 50+) TABS Take by mouth.    . nitrofurantoin, macrocrystal-monohydrate, (MACROBID) 100 MG capsule Take 1 capsule (100 mg total) by mouth 2 (two) times daily. 10 capsule 0  . Ranitidine HCl ([employer] RANITIDINE PO) Take one by mouth daily, patient unsure of strength    . simvastatin (ZOCOR) 20 MG tablet Take 1 tablet (20 mg total) by mouth at bedtime. 90 tablet 3  . vitamin B-12 (CYANOCOBALAMIN) 1000 MCG tablet Take 1,000 mcg by mouth daily.       No current facility-administered medications on file prior to visit.    PAST MEDICAL HISTORY:   Past Medical History  Diagnosis Date  . Dysmetabolic syndrome X   . Personal history of thrombophlebitis   . Essential hypertension, benign   . Morbid obesity   . Mixed hyperlipidemia   . Depressive disorder, not elsewhere classified   . Bell's palsy     1985  . Adjustment disorder with depressed mood   . DVT (deep venous thrombosis), left "early 2000's"    RLE  . Syncope and  collapse 12/06/11    "this am; I pass out fairly often"  . Type II or unspecified type diabetes mellitus without mention of complication, not stated as uncontrolled 12/06/11    "not anymore; had lap band OR"  . Neurodegenerative gait disorder   . JASNKNLZ(767.3) 05/2011    "pretty often since they put me on Parkinson's medicine"  . Parkinson disease     "they think that's what it is"  . DEMENTIA   . 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 Anxiety   . History of shingles     "as a teen and in my 21's"    PAST SURGICAL HISTORY:   Past Surgical History  Procedure Laterality Date  . Tonsillectomy and adenoidectomy  1970  . Bladder tack  11/1995  . Septoplasty  08/1998    with antral window   . Knee arthroscopy  12/12/2002    right   . Fracture surgery  02/09/2006    bilateral elbows  . Fracture surgery      right knee  . Fracture surgery      tib plateau  . Breast cyst aspiration  12/1998    bilaterally  . Breast biopsy  05/20/2000    bilaterally  . Tubal ligation  1970's  . Cataract extraction w/ intraocular lens  implant, bilateral    . Laparoscopic gastric banding  2008  . Cardiac catheterization  2004    SOCIAL HISTORY:   Social History   Social History  . Marital Status: Widowed    Spouse Name: N/A  . Number of Children: 2  . Years of Education: N/A   Occupational History  . 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 Smoking status: Never Smoker   . Smokeless tobacco: Never Used  . Alcohol Use: No     Comment: none  . Drug Use: No  . Sexual Activity: No   Other Topics Concern  . Not on file   Social History Narrative   Lives with daughter and granddaughter.   Daughter is HPOA.   DNR.    FAMILY HISTORY:   Family Status  Relation Status Death Age  . Mother Deceased 48    natural causes  . Father Deceased 84    MI  . Sister Alive     DM-2  . Child Alive     2, HTN, DM-2,  hyperlipidemia    ROS:  A complete 10 system review of systems was obtained and was unremarkable apart from what is mentioned above.  PHYSICAL EXAMINATION:    VITALS:   Filed Vitals:   04/10/15 0806  BP: 98/60  Pulse: 80  Height: 5\' 1"  (1.549 m)  Weight: 160 lb (72.576 kg)     GEN:  The patient appears stated age and is in NAD. HEENT:  Normocephalic, atraumatic.  The mucous membranes are moist. The superficial temporal arteries are without ropiness or tenderness. CV:  RRR Lungs:  CTAB Neck/HEME:  There are no carotid bruits bilaterally.  Neurological examination:  Orientation: The patient was alert and oriented x 3.  MocA 04/11/2012 was 25/30, 04/03/13 was 20/30, and 05/07/14 and was 26/30 Cranial nerves: There is L facial droop with decreased forehead wrinkle on the L.  She has a positive stellwags sign. Pupils are equal round and reactive to light bilaterally. Fundoscopic exam reveals clear margins bilaterally. Extraocular muscles are intact.  There are square wave jerks and difficulty with smooth pursuit.  The visual fields are full to confrontational testing. The speech is hypophonic but fluent and clear. She has no trouble repeating gutteral sounds.  Soft palate rises symmetrically and there is no tongue deviation. Hearing is intact to conversational tone. Sensation: Sensation is intact to light and pinprick throughout (facial, trunk, extremities). Vibration is decreased at the bilateral big toe. There is no extinction with double simultaneous stimulation. There is no sensory dermatomal level identified. Motor: Strength is 5/5 in the bilateral upper and lower extremities.   Shoulder shrug is equal and symmetric.  There is no pronator drift. Deep tendon reflexes: Deep tendon reflexes are 3/4 at the bilateral biceps, triceps, brachioradialis, and patella.  There are cross adductor reflexes at the knees bilaterally.  Plantar responses are upgoing bilaterally.  Movement  examination: Tone: There is normal tone in the bilateral upper extremities today. The tone in the lower extremities is normal.  Abnormal movements: Rare tremor noted today in L hand Coordination:  There is decremation with RAM's, including finger taps on the L and heel taps on right. Gait and Station: The patient is short stepped and mildly unstable.  She has some difficulty with turns.  LABS:  Lab  Results  Component Value Date   WBC 9.5 11/29/2014   HGB 13.2 11/29/2014   HCT 39.5 11/29/2014   MCV 93.6 11/29/2014   PLT 237.0 11/29/2014     Chemistry      Component Value Date/Time   NA 139 11/29/2014 0947   K 4.1 11/29/2014 0947   CL 101 11/29/2014 0947   CO2 32 11/29/2014 0947   BUN 20 11/29/2014 0947   CREATININE 0.71 11/29/2014 0947      Component Value Date/Time   CALCIUM 10.8* 11/29/2014 0947   ALKPHOS 62 11/29/2014 0947   AST 15 11/29/2014 0947   ALT 8 11/29/2014 0947   BILITOT 0.4 11/29/2014 0947     Lab Results  Component Value Date   TSH 2.11 11/29/2014   Lab Results  Component Value Date   VITAMINB12 1387* 11/16/2012    Lab Results  Component Value Date   HGBA1C 6.0 11/29/2014   Lab Results  Component Value Date   ESRSEDRATE 18 04/03/2013   Lab Results  Component Value Date   VITAMINB12 6314* 11/16/2012    ASSESSMENT/PLAN: 1.  Parkinsonism.  Her primary issue is gait instability with slowness of movement.  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  However, both the patient and her daughter think that levodopa has been beneficial and they think it continues to be beneficial, as I actually considered tapering it.  Neither of them wanted that S. symptoms got worse when I tried this in the past.  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  -she will remain on carbidopa/levodopa 25/100, 3 tablets 3 times per day.  Risks,  benefits, side effects and alternative therapies were discussed.  The opportunity to ask questions was given and they were answered to the best of my ability.  The patient expressed understanding and willingness to follow the outlined treatment protocols.  -Safety discussed.  Pt education provided. 2.  Hx of seizure.  This is likely secondary to the initiation of wellbutrin.  She should remain off of this medication.  EEG in the hospital was normal.  She has been seizure-free for several years. 3.  Depression.  I agree with the addition of remeron which has helped sleep, appetite and anxiety.    - I think her depression is situational and related to not being able to drive.  They don't want to pursue driving eval.  No SI/HI 4.  Memory change  -Because I was not convinced that the patient had a dementia but her previous neuropsych test years ago said that she did and because some of the patient's depression surrounds the fact that her daughter will not let her drive because of this potential diagnosis, we repeated neuropsych testing in 10/2014.  While there was evidence of cognitive change due to depression, she did not meet the criteria for the diagnosis of dementia.  Her daughter states that "be honest, mama, that your memory is going downhill."  I told her again, that I was not convinced of dementia and neuropsych didn't support this right now.  Pt is not driving.  Dr. Conley Canal and I both gave her information on a medical driving evaluation but her daughter won't allow it. 5.  Chronic lumbar radiculopathy  -She is c/o continued pain but doesn't want PT, although I think that it would be beneficial.  She found PT to  be very beneficial in the past.  Will let me know if she changes her mind.    -continue gabapentin to 100 tid.  Helping headache as well so no longer seeing Dr. Domingo Cocking 6.  F/u 6 months, sooner should new issues arise.  Much greater than 50% of this visit was spent in counseling with the  patient and the family.  Total face to face time:  25 min

## 2015-04-15 ENCOUNTER — Other Ambulatory Visit: Payer: Self-pay | Admitting: Family Medicine

## 2015-04-17 NOTE — Telephone Encounter (Signed)
Last f/u 11/2014 

## 2015-04-18 NOTE — Telephone Encounter (Signed)
Rx called in to requested pharmacy 

## 2015-04-29 ENCOUNTER — Ambulatory Visit
Admission: RE | Admit: 2015-04-29 | Discharge: 2015-04-29 | Disposition: A | Discharge status: Home / Self Care | Payer: Medicare Other | Source: Ambulatory Visit | Attending: Family Medicine | Admitting: Family Medicine

## 2015-04-29 DIAGNOSIS — R921 Mammographic calcification found on diagnostic imaging of breast: Secondary | ICD-10-CM

## 2015-04-29 DIAGNOSIS — R928 Other abnormal and inconclusive findings on diagnostic imaging of breast: Secondary | ICD-10-CM | POA: Diagnosis not present

## 2015-05-09 ENCOUNTER — Other Ambulatory Visit: Payer: Self-pay | Admitting: Family Medicine

## 2015-05-12 ENCOUNTER — Other Ambulatory Visit: Payer: Self-pay | Admitting: Family Medicine

## 2015-05-12 NOTE — Telephone Encounter (Signed)
Pt wanted to ck status of alprazolam refill; advised denied because to early to refill; pt voiced understanding and will request later this week.

## 2015-05-13 NOTE — Telephone Encounter (Signed)
Alprazolam last refilled 04/17/15 #60 with 0 refills. Ok to refill this medication?

## 2015-05-13 NOTE — Telephone Encounter (Signed)
Rx faxed to pharmacy  

## 2015-05-15 ENCOUNTER — Other Ambulatory Visit: Payer: Self-pay | Admitting: Neurology

## 2015-05-15 NOTE — Telephone Encounter (Signed)
Carbidopa Levodopa refill requested. Per last office note- patient to remain on medication. Refill approved and sent to patient's pharmacy.   

## 2015-05-26 ENCOUNTER — Ambulatory Visit: Payer: Self-pay | Admitting: Family Medicine

## 2015-05-27 ENCOUNTER — Ambulatory Visit (INDEPENDENT_AMBULATORY_CARE_PROVIDER_SITE_OTHER): Payer: Medicare Other | Admitting: Family Medicine

## 2015-05-27 ENCOUNTER — Encounter: Payer: Self-pay | Admitting: Family Medicine

## 2015-05-27 ENCOUNTER — Other Ambulatory Visit: Payer: Self-pay | Admitting: Family Medicine

## 2015-05-27 VITALS — BP 130/68 | HR 83 | Temp 98.0°F | Wt 163.2 lb

## 2015-05-27 DIAGNOSIS — Z23 Encounter for immunization: Secondary | ICD-10-CM | POA: Diagnosis not present

## 2015-05-27 DIAGNOSIS — R350 Frequency of micturition: Secondary | ICD-10-CM

## 2015-05-27 DIAGNOSIS — N390 Urinary tract infection, site not specified: Secondary | ICD-10-CM | POA: Diagnosis not present

## 2015-05-27 HISTORY — DX: Urinary tract infection, site not specified: N39.0

## 2015-05-27 LAB — POCT URINALYSIS DIPSTICK
Bilirubin, UA: NEGATIVE
Blood, UA: NEGATIVE
Glucose, UA: NEGATIVE
Ketones, UA: NEGATIVE
Leukocytes, UA: NEGATIVE
Nitrite, UA: NEGATIVE
Protein, UA: NEGATIVE
Spec Grav, UA: 1.015
Urobilinogen, UA: 0.2
pH, UA: 7.5

## 2015-05-27 NOTE — Progress Notes (Signed)
SUBJECTIVE: [name] is a 69 y.o. female who complains of urinary frequency and strong odor to her urine for x 3 days, without flank pain, fever, chills, or abnormal vaginal discharge or bleeding.   Does have a h/o recurrent UTIs.  Last urine cx- 01/17/15- no growth Urine cx 03/07/14- >100,000 - ? Contaminant, no dominant bacteria  In June, given Bactrim for UTI symptoms.  Current Outpatient Prescriptions on File Prior to Visit  Medication Sig Dispense Refill  . ALPRAZolam (XANAX) 0.5 MG tablet TAKE 1 TABLET BY MOUTH 2 TIMES DAILY AS NEEDED 60 tablet 0  . aspirin 81 MG tablet Take 81 mg by mouth daily.     Marland Kitchen Bioflavonoid Products (GRAPE SEED PO) Take by mouth daily.    . Calcium Carbonate-Vitamin D (CALCIUM + D PO) Take 600 mg by mouth daily.    . carbidopa-levodopa (SINEMET IR) 25-100 MG tablet Take 3 tablets by mouth 3 (three) times daily. 270 tablet 5  . cetirizine (ZYRTEC) 10 MG tablet Take 10 mg by mouth daily.    . Cholecalciferol (VITAMIN D3) 1000 UNITS CAPS Take 1 capsule by mouth daily.      . feeding supplement (ENSURE COMPLETE) LIQD Take 237 mLs by mouth 2 (two) times daily between meals. 1 Bottle 11  . FLUoxetine (PROZAC) 20 MG capsule TAKE ONE CAPSULE BY MOUTH EVERY DAY 90 capsule 1  . gabapentin (NEURONTIN) 100 MG capsule TAKE 1 CAPSULE (100 MG TOTAL) BY MOUTH 3 (THREE) TIMES DAILY. 270 capsule 1  . losartan-hydrochlorothiazide (HYZAAR) 50-12.5 MG per tablet TAKE 1 TABLET BY MOUTH DAILY 90 tablet 3  . mirtazapine (REMERON) 30 MG tablet TAKE 1 TABLET BY MOUTH AT BEDTIME 30 tablet 5  . Multiple Vitamins-Minerals (CENTRUM SILVER ADULT 50+) TABS Take by mouth.    . nitrofurantoin, macrocrystal-monohydrate, (MACROBID) 100 MG capsule Take 1 capsule (100 mg total) by mouth 2 (two) times daily. 10 capsule 0  . Ranitidine HCl ([facility] RANITIDINE PO) Take one by mouth daily, patient unsure of strength    . simvastatin (ZOCOR) 20 MG tablet Take 1 tablet (20 mg total) by mouth at  bedtime. 90 tablet 3  . vitamin B-12 (CYANOCOBALAMIN) 1000 MCG tablet Take 1,000 mcg by mouth daily.       No current facility-administered medications on file prior to visit.    Allergies  Allergen Reactions  . Codeine Sulfate     REACTION: hallucinations  . Penicillins Anaphylaxis, Swelling and Rash  . Wellbutrin [Bupropion] Other (See Comments)    Seizures and hallucinations    Past Medical History  Diagnosis Date  . Dysmetabolic syndrome X   . Personal history of thrombophlebitis   . Essential hypertension, benign   . Morbid obesity ([place])   . Mixed hyperlipidemia   . Depressive disorder, not elsewhere classified   . Bell's palsy     1985  . Adjustment disorder with depressed mood   . DVT (deep venous thrombosis), left "early 2000's"    RLE  . Syncope and collapse 12/06/11    "this am; I pass out fairly often"  . Type II or unspecified type diabetes mellitus without mention of complication, not stated as uncontrolled 12/06/11    "not anymore; had lap band OR"  . Neurodegenerative gait disorder   . [id number](681.1) 05/2011    "pretty often since they put me on Parkinson's medicine"  . Parkinson disease [facility])     "they think that's what it is"  . DEMENTIA   . 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 Anxiety   . History of shingles     "as a teen and in my 7's"    Past Surgical History  Procedure Laterality Date  . Tonsillectomy and adenoidectomy  1970  . Bladder tack  11/1995  . Septoplasty  08/1998    with antral window   . Knee arthroscopy  12/12/2002    right   . Fracture surgery  02/09/2006    bilateral elbows  . Fracture surgery      right knee  . Fracture surgery      tib plateau  . Breast cyst aspiration  12/1998    bilaterally  . Breast biopsy  05/20/2000    bilaterally  . Tubal ligation  1970's  . Cataract extraction w/ intraocular lens  implant, bilateral    . Laparoscopic gastric banding  2008  . Cardiac  catheterization  2004    Family History  Problem Relation Age of Onset  . COPD Father   . Diabetes Sister   . Vision loss Sister     legally blind  . Asthma Mother   . Heart disease Father     Social History   Social History  . Marital Status: Widowed    Spouse Name: N/A  . Number of Children: 2  . Years of Education: N/A   Occupational History  . 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 Smoking status: Never Smoker   . Smokeless tobacco: Never Used  . Alcohol Use: No     Comment: none  . Drug Use: No  . Sexual Activity: No   Other Topics Concern  . Not on file   Social History Narrative   Lives with daughter and granddaughter.   Daughter is HPOA.   DNR.   The PMH, PSH, Social History, Family History, Medications, and allergies have been reviewed in [facility], and have been updated if relevant.  OBJECTIVE:  BP 130/68 mmHg  Pulse 83  Temp(Src) 98 F (36.7 C) (Oral)  Wt 163 lb 4 oz (74.05 kg)  SpO2 97%  Appears well, in no apparent distress.  Vital signs are normal. The abdomen is soft without tenderness, guarding, mass, rebound or organomegaly. No CVA tenderness or inguinal adenopathy noted. Urine dipstick shows negative for all components.    ASSESSMENT: Bladder irritation. No signs of infection.  PLAN:  Frequency may be due to bladder irritants... Drink water, avoid alcohol, caffeine, soda, citris, tomato, spicy foods.

## 2015-05-27 NOTE — Progress Notes (Signed)
Pre visit review using our clinic review tool, if applicable. No additional management support is needed unless otherwise documented below in the visit note. 

## 2015-05-28 NOTE — Telephone Encounter (Signed)
Last labs 11/2014-elevated

## 2015-06-11 ENCOUNTER — Other Ambulatory Visit: Payer: Self-pay | Admitting: Family Medicine

## 2015-06-12 NOTE — Telephone Encounter (Signed)
rx called in to requested pharmacy 

## 2015-06-12 NOTE — Telephone Encounter (Signed)
Last f/u 11/2014-CPE

## 2015-07-08 ENCOUNTER — Other Ambulatory Visit: Payer: Self-pay | Admitting: Family Medicine

## 2015-07-10 ENCOUNTER — Other Ambulatory Visit: Payer: Self-pay | Admitting: Family Medicine

## 2015-07-10 DIAGNOSIS — L03031 Cellulitis of right toe: Secondary | ICD-10-CM | POA: Diagnosis not present

## 2015-07-10 DIAGNOSIS — L03032 Cellulitis of left toe: Secondary | ICD-10-CM | POA: Diagnosis not present

## 2015-07-11 NOTE — Telephone Encounter (Signed)
Rx called in to requested pharmacy 

## 2015-07-11 NOTE — Telephone Encounter (Signed)
Last f/u 11/2014-CPE

## 2015-07-24 DIAGNOSIS — L03031 Cellulitis of right toe: Secondary | ICD-10-CM | POA: Diagnosis not present

## 2015-07-24 DIAGNOSIS — L03032 Cellulitis of left toe: Secondary | ICD-10-CM | POA: Diagnosis not present

## 2015-08-12 DIAGNOSIS — L03032 Cellulitis of left toe: Secondary | ICD-10-CM | POA: Diagnosis not present

## 2015-08-12 DIAGNOSIS — L03031 Cellulitis of right toe: Secondary | ICD-10-CM | POA: Diagnosis not present

## 2015-08-20 ENCOUNTER — Other Ambulatory Visit: Payer: Self-pay | Admitting: *Deleted

## 2015-08-20 MED ORDER — ALPRAZOLAM 0.5 MG PO TABS: ORAL_TABLET | ORAL | Status: DC

## 2015-08-20 NOTE — Telephone Encounter (Signed)
Last f/u 11/2014-CPE

## 2015-08-20 NOTE — Telephone Encounter (Signed)
Rx called in to requested pharmacy 

## 2015-08-26 ENCOUNTER — Encounter: Payer: Self-pay | Admitting: Family Medicine

## 2015-08-26 ENCOUNTER — Ambulatory Visit (INDEPENDENT_AMBULATORY_CARE_PROVIDER_SITE_OTHER): Payer: Medicare Other | Admitting: Family Medicine

## 2015-08-26 VITALS — BP 126/76 | HR 71 | Temp 97.8°F | Wt 159.5 lb

## 2015-08-26 DIAGNOSIS — K219 Gastro-esophageal reflux disease without esophagitis: Secondary | ICD-10-CM | POA: Diagnosis not present

## 2015-08-26 DIAGNOSIS — R1013 Epigastric pain: Secondary | ICD-10-CM | POA: Insufficient documentation

## 2015-08-26 HISTORY — DX: Gastro-esophageal reflux disease without esophagitis: K21.9

## 2015-08-26 LAB — H. PYLORI ANTIBODY, IGG: H Pylori IgG: NEGATIVE

## 2015-08-26 LAB — LIPASE: Lipase: 29 U/L (ref 11.0–59.0)

## 2015-08-26 NOTE — Progress Notes (Signed)
Subjective:   Patient ID: [name], female    DOB: [birth year], 70 y.o.   MRN: [id number]  [name] is a pleasant 70 y.o. year old female who presents to clinic today with her daughter for Abdominal Pain  on 08/26/2015  HPI:  Several weeks of epigastric pain, reflux, abdominal bloating and nausea/ ? Vomiting- "more like phlegm."  No black or bloody stools.  Takes ranitidine regularly and tried two weeks of daily OTC prilosec without improvement of symptoms.  No fevers or chills.  Does have a remote h/o pancreatitis.  Current Outpatient Prescriptions on File Prior to Visit  Medication Sig Dispense Refill  . ALPRAZolam (XANAX) 0.5 MG tablet TAKE 1 TABLET TWICE A DAY AS NEEDED FOR ANXIETY 60 tablet 0  . aspirin 81 MG tablet Take 81 mg by mouth daily.     Marland Kitchen Bioflavonoid Products (GRAPE SEED PO) Take by mouth daily.    . Calcium Carbonate-Vitamin D (CALCIUM + D PO) Take 600 mg by mouth daily.    . carbidopa-levodopa (SINEMET IR) 25-100 MG tablet Take 3 tablets by mouth 3 (three) times daily. 270 tablet 5  . cetirizine (ZYRTEC) 10 MG tablet Take 10 mg by mouth daily.    . Cholecalciferol (VITAMIN D3) 1000 UNITS CAPS Take 1 capsule by mouth daily.      . feeding supplement (ENSURE COMPLETE) LIQD Take 237 mLs by mouth 2 (two) times daily between meals. 1 Bottle 11  . FLUoxetine (PROZAC) 20 MG capsule TAKE ONE CAPSULE BY MOUTH EVERY DAY 90 capsule 0  . gabapentin (NEURONTIN) 100 MG capsule TAKE 1 CAPSULE (100 MG TOTAL) BY MOUTH 3 (THREE) TIMES DAILY. 270 capsule 1  . losartan-hydrochlorothiazide (HYZAAR) 50-12.5 MG per tablet TAKE 1 TABLET BY MOUTH DAILY 90 tablet 3  . mirtazapine (REMERON) 30 MG tablet TAKE 1 TABLET BY MOUTH AT BEDTIME 30 tablet 5  . Multiple Vitamins-Minerals (CENTRUM SILVER ADULT 50+) TABS Take by mouth.    . nitrofurantoin, macrocrystal-monohydrate, (MACROBID) 100 MG capsule Take 1 capsule (100 mg total) by mouth 2 (two) times daily. 10 capsule 0  .  simvastatin (ZOCOR) 20 MG tablet TAKE 1 TABLET (20 MG TOTAL) BY MOUTH AT BEDTIME. 90 tablet 3  . vitamin B-12 (CYANOCOBALAMIN) 1000 MCG tablet Take 1,000 mcg by mouth daily.      . Ranitidine HCl ([facility]S RANITIDINE PO) Reported on 08/26/2015     No current facility-administered medications on file prior to visit.    Allergies  Allergen Reactions  . Codeine Sulfate     REACTION: hallucinations  . Penicillins Anaphylaxis, Swelling and Rash  . Wellbutrin [Bupropion] Other (See Comments)    Seizures and hallucinations    Past Medical History  Diagnosis Date  . Dysmetabolic syndrome X   . Personal history of thrombophlebitis   . Essential hypertension, benign   . Morbid obesity ([place])   . Mixed hyperlipidemia   . Depressive disorder, not elsewhere classified   . Bell's palsy     1985  . Adjustment disorder with depressed mood   . DVT (deep venous thrombosis), left "early 2000's"    RLE  . Syncope and collapse 12/06/11    "this am; I pass out fairly often"  . Type II or unspecified type diabetes mellitus without mention of complication, not stated as uncontrolled 12/06/11    "not anymore; had lap band OR"  . Neurodegenerative gait disorder   . [id number]) 05/2011    "pretty often since they put me  on Parkinson's medicine"  . Parkinson disease [facility])     "they think that's what it is"  . DEMENTIA   . 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 Anxiety   . History of shingles     "as a teen and in my 60's"    Past Surgical History  Procedure Laterality Date  . Tonsillectomy and adenoidectomy  1970  . Bladder tack  11/1995  . Septoplasty  08/1998    with antral window   . Knee arthroscopy  12/12/2002    right   . Fracture surgery  02/09/2006    bilateral elbows  . Fracture surgery      right knee  . Fracture surgery      tib plateau  . Breast cyst aspiration  12/1998    bilaterally  . Breast biopsy  05/20/2000    bilaterally  . Tubal  ligation  1970's  . Cataract extraction w/ intraocular lens  implant, bilateral    . Laparoscopic gastric banding  2008  . Cardiac catheterization  2004    Family History  Problem Relation Age of Onset  . COPD Father   . Diabetes Sister   . Vision loss Sister     legally blind  . Asthma Mother   . Heart disease Father     Social History   Social History  . Marital Status: Widowed    Spouse Name: N/A  . Number of Children: 2  . Years of Education: N/A   Occupational History  . 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 Smoking status: Never Smoker   . Smokeless tobacco: Never Used  . Alcohol Use: No     Comment: none  . Drug Use: No  . Sexual Activity: No   Other Topics Concern  . Not on file   Social History Narrative   Lives with daughter and granddaughter.   Daughter is HPOA.   DNR.   The PMH, PSH, Social History, Family History, Medications, and allergies have been reviewed in [facility], and have been updated if relevant.   Review of Systems  Constitutional: Negative.   HENT: Negative.   Gastrointestinal: Positive for nausea, vomiting, abdominal pain and abdominal distention. Negative for diarrhea, constipation, blood in stool, anal bleeding and rectal pain.  Genitourinary: Negative.   Musculoskeletal: Negative.   Skin: Negative.   Neurological: Negative.   Psychiatric/Behavioral: Negative.   All other systems reviewed and are negative.      Objective:    BP 126/76 mmHg  Pulse 71  Temp(Src) 97.8 F (36.6 C) (Oral)  Wt 159 lb 8 oz (72.349 kg)  SpO2 97%   Physical Exam  Constitutional: She appears well-developed and well-nourished. No distress.  HENT:  Head: Normocephalic.  Eyes: Conjunctivae are normal.  Cardiovascular: Normal rate.   Pulmonary/Chest: Effort normal.  Abdominal: Soft. She exhibits no distension and no mass. There is tenderness. There is no rebound and no guarding.  Musculoskeletal: Normal  range of motion. She exhibits no edema.  Neurological: She is alert. No cranial nerve deficit.  Skin: Skin is warm and dry. She is not diaphoretic.  Psychiatric: She has a normal mood and affect. Her behavior is normal. Judgment and thought content normal.  Nursing note and vitals reviewed.         Assessment & Plan:   Gastroesophageal reflux disease, esophagitis presence not specified No Follow-up  on file.

## 2015-08-26 NOTE — Progress Notes (Signed)
Pre visit review using our clinic review tool, if applicable. No additional management support is needed unless otherwise documented below in the visit note. 

## 2015-08-26 NOTE — Assessment & Plan Note (Signed)
Deteriorated now with epigastric pain/tenderness on exam. ? H. Pylori. Will get labs today- rule out H Pylori and pancreatitis prior to changing rxs. The patient indicates understanding of these issues and agrees with the plan.  Orders Placed This Encounter  Procedures  . H. pylori antibody, IgG  . Lipase

## 2015-09-14 ENCOUNTER — Other Ambulatory Visit: Payer: Self-pay | Admitting: Family Medicine

## 2015-09-15 NOTE — Telephone Encounter (Signed)
Received refill request electronically Last refill 08/20/15 #60 last office visit 08/26/15 Is it okay to refill?

## 2015-09-15 NOTE — Telephone Encounter (Signed)
Rx called to pharmacy as instructed. 

## 2015-09-24 ENCOUNTER — Encounter: Payer: Self-pay | Admitting: Family Medicine

## 2015-09-24 ENCOUNTER — Ambulatory Visit (INDEPENDENT_AMBULATORY_CARE_PROVIDER_SITE_OTHER): Payer: Medicare Other | Admitting: Family Medicine

## 2015-09-24 VITALS — BP 138/78 | HR 67 | Temp 97.9°F | Wt 161.2 lb

## 2015-09-24 DIAGNOSIS — K219 Gastro-esophageal reflux disease without esophagitis: Secondary | ICD-10-CM

## 2015-09-24 DIAGNOSIS — R1013 Epigastric pain: Secondary | ICD-10-CM

## 2015-09-24 NOTE — Patient Instructions (Signed)
Food Choices for Gastroesophageal Reflux Disease, Adult When you have gastroesophageal reflux disease (GERD), the foods you eat and your eating habits are very important. Choosing the right foods can help ease the discomfort of GERD. WHAT GENERAL GUIDELINES DO I NEED TO FOLLOW?  Choose fruits, vegetables, whole grains, low-fat dairy products, and low-fat meat, fish, and poultry.  Limit fats such as oils, salad dressings, butter, nuts, and avocado.  Keep a food diary to identify foods that cause symptoms.  Avoid foods that cause reflux. These may be different for different people.  Eat frequent small meals instead of three large meals each day.  Eat your meals slowly, in a relaxed setting.  Limit fried foods.  Cook foods using methods other than frying.  Avoid drinking alcohol.  Avoid drinking large amounts of liquids with your meals.  Avoid bending over or lying down until 2-3 hours after eating. WHAT FOODS ARE NOT RECOMMENDED? The following are some foods and drinks that may worsen your symptoms: Vegetables Tomatoes. Tomato juice. Tomato and spaghetti sauce. Chili peppers. Onion and garlic. Horseradish. Fruits Oranges, grapefruit, and lemon (fruit and juice). Meats High-fat meats, fish, and poultry. This includes hot dogs, ribs, ham, sausage, salami, and bacon. Dairy Whole milk and chocolate milk. Sour cream. Cream. Butter. Ice cream. Cream cheese.  Beverages Coffee and tea, with or without caffeine. Carbonated beverages or energy drinks. Condiments Hot sauce. Barbecue sauce.  Sweets/Desserts Chocolate and cocoa. Donuts. Peppermint and spearmint. Fats and Oils High-fat foods, including French fries and potato chips. Other Vinegar. Strong spices, such as black pepper, white pepper, red pepper, cayenne, curry powder, cloves, ginger, and chili powder. The items listed above may not be a complete list of foods and beverages to avoid. Contact your dietitian for more  information.   This information is not intended to replace advice given to you by your health care provider. Make sure you discuss any questions you have with your health care provider.   Document Released: 07/12/2005 Document Revised: 08/02/2014 Document Reviewed: 05/16/2013 Elsevier Interactive Patient Education 2016 Elsevier Inc.  

## 2015-09-24 NOTE — Progress Notes (Signed)
Pre visit review using our clinic review tool, if applicable. No additional management support is needed unless otherwise documented below in the visit note. 

## 2015-09-24 NOTE — Assessment & Plan Note (Signed)
>  25 minutes spent in face to face time with patient, >50% spent in counselling or coordination of care. We discussed her diet- she eats a lot of chocolate and drinks carbonated drinks.  I would prefer for her to eat a GERD friendly diet before increasing dose of PPI. The patient indicates understanding of these issues and agrees with the plan. Call or return to clinic prn if these symptoms worsen or fail to improve as anticipated.

## 2015-09-24 NOTE — Progress Notes (Signed)
Subjective:   Patient ID: [name], female    DOB: Mar 30, 1946, 70 y.o.   MRN: [id number]  [name] is a pleasant 70 y.o. year old female who presents to clinic today with No chief complaint on file.  on 09/24/2015  HPI:  GERD- saw her on 08/26/15 for worsening reflux and abdominal pain.  Note reviewed.  At that time, complained of several weeks of epigastric pain, reflux, abdominal bloating and nausea/ ? Vomiting- "more like phlegm."  No black or bloody stools.  Was taking ranitidine regularly  without improvement of symptoms.  No fevers or chills.  Does have a remote h/o pancreatitis.  H pylori neg.  Lipase normal.  Advised OTC prilosec or other PPI and follow up in 2 weeks. She did not feel prilosec provided any relief.  Symptoms are about the same.  No new or red flag symptoms.  Current Outpatient Prescriptions on File Prior to Visit  Medication Sig Dispense Refill  . ALPRAZolam (XANAX) 0.5 MG tablet TAKE 1 TABLET TWICE A DAY AS NEEDED FOR ANXIETY 60 tablet 0  . aspirin 81 MG tablet Take 81 mg by mouth daily.     Marland Kitchen Bioflavonoid Products (GRAPE SEED PO) Take by mouth daily.    . Calcium Carbonate-Vitamin D (CALCIUM + D PO) Take 600 mg by mouth daily.    . carbidopa-levodopa (SINEMET IR) 25-100 MG tablet Take 3 tablets by mouth 3 (three) times daily. 270 tablet 5  . cetirizine (ZYRTEC) 10 MG tablet Take 10 mg by mouth daily.    . Cholecalciferol (VITAMIN D3) 1000 UNITS CAPS Take 1 capsule by mouth daily.      . feeding supplement (ENSURE COMPLETE) LIQD Take 237 mLs by mouth 2 (two) times daily between meals. 1 Bottle 11  . FLUoxetine (PROZAC) 20 MG capsule TAKE ONE CAPSULE BY MOUTH EVERY DAY 90 capsule 0  . gabapentin (NEURONTIN) 100 MG capsule TAKE 1 CAPSULE (100 MG TOTAL) BY MOUTH 3 (THREE) TIMES DAILY. 270 capsule 1  . losartan-hydrochlorothiazide (HYZAAR) 50-12.5 MG per tablet TAKE 1 TABLET BY MOUTH DAILY 90 tablet 3  . mirtazapine (REMERON) 30 MG tablet  TAKE 1 TABLET BY MOUTH AT BEDTIME 30 tablet 5  . Multiple Vitamins-Minerals (CENTRUM SILVER ADULT 50+) TABS Take by mouth.    . nitrofurantoin, macrocrystal-monohydrate, (MACROBID) 100 MG capsule Take 1 capsule (100 mg total) by mouth 2 (two) times daily. 10 capsule 0  . Ranitidine HCl ([facility]S RANITIDINE PO) Reported on 08/26/2015    . simvastatin (ZOCOR) 20 MG tablet TAKE 1 TABLET (20 MG TOTAL) BY MOUTH AT BEDTIME. 90 tablet 3  . vitamin B-12 (CYANOCOBALAMIN) 1000 MCG tablet Take 1,000 mcg by mouth daily.       No current facility-administered medications on file prior to visit.    Allergies  Allergen Reactions  . Codeine Sulfate     REACTION: hallucinations  . Penicillins Anaphylaxis, Swelling and Rash  . Wellbutrin [Bupropion] Other (See Comments)    Seizures and hallucinations    Past Medical History  Diagnosis Date  . Dysmetabolic syndrome X   . Personal history of thrombophlebitis   . Essential hypertension, benign   . Morbid obesity ([place])   . Mixed hyperlipidemia   . Depressive disorder, not elsewhere classified   . Bell's palsy     1985  . Adjustment disorder with depressed mood   . DVT (deep venous thrombosis), left "early 2000's"    RLE  . Syncope and collapse 12/06/11    "  this am; I pass out fairly often"  . Type II or unspecified type diabetes mellitus without mention of complication, not stated as uncontrolled 12/06/11    "not anymore; had lap band OR"  . Neurodegenerative gait disorder   . [id number]) 05/2011    "pretty often since they put me on Parkinson's medicine"  . Parkinson disease [facility])     "they think that's what it is"  . DEMENTIA   . 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 Anxiety   . History of shingles     "as a teen and in my 32's"    Past Surgical History  Procedure Laterality Date  . Tonsillectomy and adenoidectomy  1970  . Bladder tack  11/1995  . Septoplasty  08/1998    with antral window   . Knee  arthroscopy  12/12/2002    right   . Fracture surgery  02/09/2006    bilateral elbows  . Fracture surgery      right knee  . Fracture surgery      tib plateau  . Breast cyst aspiration  12/1998    bilaterally  . Breast biopsy  05/20/2000    bilaterally  . Tubal ligation  1970's  . Cataract extraction w/ intraocular lens  implant, bilateral    . Laparoscopic gastric banding  2008  . Cardiac catheterization  2004    Family History  Problem Relation Age of Onset  . COPD Father   . Diabetes Sister   . Vision loss Sister     legally blind  . Asthma Mother   . Heart disease Father     Social History   Social History  . Marital Status: Widowed    Spouse Name: N/A  . Number of Children: 2  . Years of Education: N/A   Occupational History  . 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 Smoking status: Never Smoker   . Smokeless tobacco: Never Used  . Alcohol Use: No     Comment: none  . Drug Use: No  . Sexual Activity: No   Other Topics Concern  . Not on file   Social History Narrative   Lives with daughter and granddaughter.   Daughter is HPOA.   DNR.   The PMH, PSH, Social History, Family History, Medications, and allergies have been reviewed in [facility], and have been updated if relevant.   Review of Systems  Constitutional: Negative.   HENT: Negative.   Respiratory: Negative.   Cardiovascular: Negative.   Gastrointestinal: Positive for abdominal pain and abdominal distention. Negative for nausea, vomiting, diarrhea, constipation, blood in stool, anal bleeding and rectal pain.  Genitourinary: Negative.   Musculoskeletal: Negative.   Skin: Negative.   Neurological: Negative.   Hematological: Negative.   Psychiatric/Behavioral: Negative.   All other systems reviewed and are negative.      Objective:    There were no vitals taken for this visit.   Physical Exam  Constitutional: She is oriented to person, place, and  time. She appears well-developed and well-nourished. No distress.  HENT:  Head: Normocephalic and atraumatic.  Eyes: Conjunctivae are normal.  Cardiovascular: Normal rate.   Pulmonary/Chest: Effort normal.  Abdominal: Soft. Bowel sounds are normal. She exhibits no distension. There is no tenderness. There is no rebound and no guarding.  Musculoskeletal: Normal range of motion.  Neurological: She is alert and oriented to person,  place, and time. No cranial nerve deficit.  Skin: Skin is warm and dry. She is not diaphoretic.  Psychiatric: She has a normal mood and affect. Her behavior is normal. Judgment and thought content normal.  Nursing note and vitals reviewed.         Assessment & Plan:   Gastroesophageal reflux disease, esophagitis presence not specified  Abdominal pain, epigastric No Follow-up on file.

## 2015-09-30 ENCOUNTER — Other Ambulatory Visit: Payer: Self-pay | Admitting: Neurology

## 2015-10-01 NOTE — Telephone Encounter (Signed)
Gabapentin refill requested. Per last office note- patient to remain on medication. Refill approved and sent to patient's pharmacy.   

## 2015-10-02 ENCOUNTER — Other Ambulatory Visit: Payer: Self-pay | Admitting: Family Medicine

## 2015-10-05 ENCOUNTER — Other Ambulatory Visit: Payer: Self-pay | Admitting: Neurology

## 2015-10-06 ENCOUNTER — Other Ambulatory Visit: Payer: Self-pay | Admitting: Neurology

## 2015-10-06 NOTE — Telephone Encounter (Signed)
Remeron refill requested. Per last office note- patient to remain on medication. Refill approved and sent to patient's pharmacy.   

## 2015-10-08 ENCOUNTER — Ambulatory Visit: Payer: Medicare Other | Admitting: Neurology

## 2015-10-17 ENCOUNTER — Other Ambulatory Visit: Payer: Self-pay | Admitting: Family Medicine

## 2015-10-20 ENCOUNTER — Encounter: Payer: Self-pay | Admitting: Neurology

## 2015-10-20 ENCOUNTER — Ambulatory Visit (INDEPENDENT_AMBULATORY_CARE_PROVIDER_SITE_OTHER): Payer: Medicare Other | Admitting: Neurology

## 2015-10-20 VITALS — BP 134/74 | HR 76 | Ht 61.0 in | Wt 158.0 lb

## 2015-10-20 DIAGNOSIS — G214 Vascular parkinsonism: Secondary | ICD-10-CM

## 2015-10-20 NOTE — Telephone Encounter (Signed)
Last f/u 11/2014-CPE

## 2015-10-20 NOTE — Progress Notes (Addendum)
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  This patient is accompanied in the office by her child who supplements the history.      She is on carbidopa/levodopa 25/100, 3 tablets 3 times per day.  She is doing well on this medication, without side effects.  She had one fall the day before Thanksgiving.  She was in a parking lot of a restaurant and tripped over something(curb).  She fx her nose and had to have her lip stitched up.    Since then, however, she has done very well.  Her daughter reports that she has been very stable on her feet.  Last visit, she was complaining of a decreased appetite and increased anxiety.  Therefore, I increased her Remeron to 30 mg at night.  This helped with both sleep and appetite and she is very pleased.  She also has a hx of seizure, likely secondary to Wellbutrin.  Last visit, her keppra was d/c.  She has been seizure free since that time.  However, mood and depression continue to be an issue.  I sent her to Dr. Cheryln Manly but she didn't follow up.  She states that she cannot talk to him without her daughter present.  Her biggest issue is that her granddaughter is a Ship broker and she lives with the patient.  Apparently, the majority of the house is clean and safe with the exception of the granddaughters room.  The patient states that the mess just drives her crazy and she cannot stop thinking about it.  Her granddaughter goes to community college and her leaving the house is not an option.  04/03/13 update:  [name].  She was supposed to f/u with Dr Cheryln Manly due to significant anxiety and depression and she cancelled her 3/4, 3/13 and 3/27 visits and never r/s.  She has not seen him since 10/13.  She states that she is just "not going to talk with someone."  She, however, is doing some better in that regard.  Much of her stress surrounds her granddaughter, who lives with  her and is a Ship broker.  The patient states that her granddaughter has now admitted to this, but to seek help.  They were able to get some of the room cleaned up a while ago and that seemed to really help the patient's mental state.  However, the room has reconnected and is now just as it previously was.  She also reports a significant amount of stress over her sister who has end-stage Alzheimer's dementia.  Her sister no longer recognizes her, and the patient realizes when she goes around her sister, it makes her symptoms much worse.  She feels that her memory is getting worse, but nothing like her sisters.  She also thinks that her speech is getting worse.    She is having some headaches, similar to what she had in the past, prior to starting on the Levodopa.  It is over the L forehead, but not in the eye.  It is pounding.  It is daily.  It does not cause n/v or cause photophobia or phonophobia.  It makes her feel "drained."  It does not prevent her from doing things.  There are no lateralizing paresthesias with the headaches.  There are no vision changes.  07/30/13 update:  The pt is on carbidopa/levodopa 25/100 three tablets three times per day.  She is doing well in that  regard.  She did not go to PT because of transportation issues.  She was started on zanaflex last visit for HA and was also started on the exelon patch.  Interestingly, she states that the exelon patch caused headaches but it was started after the headache.  Nonetheless, she stopped it and still has headache.  It starts in the L forehead and radiates over the L side of the head.  She still thinks that the patch made them worse however and also thinks that it caused UTI's.  She is still on zanaflex but isn't sure its helping.  No falls/hallucination/syncope/lightheadedness.  11/27/13 update:  Patient is following up today in the movement disorder clinic.  She is currently on carbidopa/levodopa 25/100, 3 tablets by mouth 3 times per day.  Since our  last visit, I did get a note from Dr. Domingo Cocking at the headache clinic.  He started the patient on Indocin 25 mg 3 times a day and she was told to titrate to 75 mg 3 times a day.  She states that she had SE with that and she was changed to gabapentin 100 mg and that is helping.  He also scheduled her for trigger point injections and an MRI brain.  She did not care for the trigger point injections although they did help.  She has f/u in June.  Walking seems to have been a little slower, but no falls.  Her daughter is with her today and feels that walking has been more tenuous.   No hallucinations.  No diplopia but is having blurry vision.  Does have vision appt in [facility] in few weeks.  05/07/14 update:   Patient is following up today in the movement disorder clinic.  She is currently on carbidopa/levodopa 25/100, 3 tablets by mouth 3 times per day.  Daughter states that she often forgets middle of the day dose but pt denies that.  She takes it at 9-10 am on awakening, 3 pm and bedtime.  She and her daughter both think that it is helpful.   No diplopia.  No falls.  She saw Dr. Domingo Cocking in June and I got a note from him.  She had more trigger point injections.  She is doing really well from a headache standpoint.  She doesn't have to return to him for 6 months (saw him in September).  She continues on remeron for her depression and mood.  She states that she hates that she is confined to the home and depression is tied into that.  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  She then asks if her daughter can come back to the room for the discussion and she did.   She did do therapy with [facility] and she thought that it went well.   10/08/14 update:   Patient is following up today in the movement disorder clinic. She is accompanied by her daughter who supplements the hx.  She is currently on carbidopa/levodopa 25/100, 3 tablets  by mouth 3 times per day.  C/o memory loss.  States that her daughter will tell her to go to the kitchen to get things and she will get there and not be able to remember.  Her daughter thinks that she jumps subjects.  Also c/o feet paresthesias.  Worries that DM has "returned" as used to be diabetic but not anymore.  Last A1C in April was 6.0.  Also  states that right leg below the knee posteriorally she is having paresthesias.  Started after she bent over one day and the knee "popped."  No LBP.  On gabapentin but that is for headache, which is well controlled.  She has been headache free for a long time.  Received a note from Dr. Domingo Cocking recently and pt told to continue meds.    04/10/15 update:  The patient is following up today.  She is on carbidopa/levodopa 25/100, 3 tablets 3 times per day.  Because I was not convinced that the patient had a dementia but her previous neuropsych test years ago said that she did and because some of the patient's depression surrounds the fact that her daughter will not let her drive because of this potential diagnosis, we repeated neuropsych testing.  While there was evidence of cognitive change due to depression, she did not meet the criteria for the diagnosis of dementia.  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  It was recommended that she undergo psychiatric evaluation and treatment, but she refused.  She was again given a brochure for the driving evaluator in [place] to have a driving evaluation.  She reports that her depression was fairly well under control until the end of 11/21/22 when her sister died and she has been appropriately grieving.  She did have an EMG done since last visit.  This was done on 11/07/2014 There was no evidence of a large fiber peripheral neuropathy, but there was evidence of a chronic, moderate right L2-L4 radiculopathy and a chronic, mild L5 radiculopathy on the right.  She fell one time getting out  of the chair as the right leg gave out.  She also rolled out of the bed at night one time.   She states that she was dreaming.   Fortunately, she did not get hurt with either of these.    10/20/15 update:  The patient follows up today, accompanied by her daughter who supplements the history.  She has a history of vascular parkinsonism and is currently on carbidopa/levodopa 25/100, 3 tablets 3 times per day.  Reports that she is a little "fidgety" and that she just can't stay still.  No new medications.  She also has a history of depression and is prescribed Remeron by her primary care physician.  Her depression mostly surrounds the fact that she cannot leave the house because her daughter will not let her drive.  Depression is improved as she has gotten some cats that keep her company.  We have talked previously about a driving evaluation, as I think she likely could drive from a neurologic standpoint.  Her daughter does not want her to pursue that.  No falls.   She has been headache free on gabapentin and doesn't want to change that  Neuroimaging has previously been performed.   It was done on 09/13/2013 and I reviewed these films.  There was atrophy and moderate small vessel disease.  MRI brain,09/13/13  FINDINGS:  Advanced cerebral and cerebellar atrophy. Chronic microvascular  ischemic change affecting the periventricular and subcortical white  matter as well as the brainstem above moderate to advanced degree.  No acute stroke, acute hemorrhage, mass lesion, or extra-axial  fluid. Hydrocephalus ex vacuo. No features to suggest normal  pressure hydrocephalus.  Post infusion, no abnormal enhancement of the brain or meninges. No  acute or reversible cause of dementia is evident.  Pituitary, pineal, and cerebellar tonsils unremarkable. No upper  cervical  lesions. Flow voids are maintained throughout the carotid,  basilar, and vertebral arteries. There are no areas of chronic  hemorrhage. Visualized  calvarium, skull base, and upper cervical  osseous structures unremarkable. Scalp and extracranial soft  tissues, orbits, sinuses, and mastoids show no acute process.  Similar appearance to priors.  IMPRESSION:  Atrophy and small vessel disease. No acute intracranial findings.    PREVIOUS MEDICATIONS: Sinemet  ALLERGIES:   Allergies  Allergen Reactions  . Codeine Sulfate     REACTION: hallucinations  . Penicillins Anaphylaxis, Swelling and Rash  . Wellbutrin [Bupropion] Other (See Comments)    Seizures and hallucinations    CURRENT MEDICATIONS:  Current Outpatient Prescriptions on File Prior to Visit  Medication Sig Dispense Refill  . ALPRAZolam (XANAX) 0.5 MG tablet TAKE 1 TABLET TWICE A DAY AS NEEDED FOR ANXIETY 60 tablet 0  . aspirin 81 MG tablet Take 81 mg by mouth daily.     Marland Kitchen Bioflavonoid Products (GRAPE SEED PO) Take by mouth daily.    . Calcium Carbonate-Vitamin D (CALCIUM + D PO) Take 600 mg by mouth daily.    . carbidopa-levodopa (SINEMET IR) 25-100 MG tablet Take 3 tablets by mouth 3 (three) times daily. 270 tablet 5  . cetirizine (ZYRTEC) 10 MG tablet Take 10 mg by mouth daily.    . Cholecalciferol (VITAMIN D3) 1000 UNITS CAPS Take 1 capsule by mouth daily.      . feeding supplement (ENSURE COMPLETE) LIQD Take 237 mLs by mouth 2 (two) times daily between meals. 1 Bottle 11  . FLUoxetine (PROZAC) 20 MG capsule TAKE ONE CAPSULE BY MOUTH EVERY DAY 90 capsule 0  . gabapentin (NEURONTIN) 100 MG capsule TAKE 1 CAPSULE (100 MG TOTAL) BY MOUTH 3 (THREE) TIMES DAILY. 270 capsule 0  . gabapentin (NEURONTIN) 100 MG capsule TAKE 1 CAPSULE (100 MG TOTAL) BY MOUTH 3 (THREE) TIMES DAILY. 270 capsule 1  . losartan-hydrochlorothiazide (HYZAAR) 50-12.5 MG per tablet TAKE 1 TABLET BY MOUTH DAILY 90 tablet 3  . mirtazapine (REMERON) 30 MG tablet TAKE 1 TABLET BY MOUTH AT BEDTIME 30 tablet 0  . Multiple Vitamins-Minerals (CENTRUM SILVER ADULT 50+) TABS Take by mouth.    . Ranitidine HCl  ([facility] RANITIDINE PO) Reported on 08/26/2015    . simvastatin (ZOCOR) 20 MG tablet TAKE 1 TABLET (20 MG TOTAL) BY MOUTH AT BEDTIME. 90 tablet 3  . vitamin B-12 (CYANOCOBALAMIN) 1000 MCG tablet Take 1,000 mcg by mouth daily.       No current facility-administered medications on file prior to visit.    PAST MEDICAL HISTORY:   Past Medical History  Diagnosis Date  . Dysmetabolic syndrome X   . Personal history of thrombophlebitis   . Essential hypertension, benign   . Morbid obesity ([place])   . Mixed hyperlipidemia   . Depressive disorder, not elsewhere classified   . Bell's palsy     1985  . Adjustment disorder with depressed mood   . DVT (deep venous thrombosis), left "early 2000's"    RLE  . Syncope and collapse 12/06/11    "this am; I pass out fairly often"  . Type II or unspecified type diabetes mellitus without mention of complication, not stated as uncontrolled 12/06/11    "not anymore; had lap band OR"  . Neurodegenerative gait disorder   . [id number]) 05/2011    "pretty often since they put me on Parkinson's medicine"  . Parkinson disease [facility])     "they think that's what it is"  .  DEMENTIA   . 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 Anxiety   . History of shingles     "as a teen and in my 8's"    PAST SURGICAL HISTORY:   Past Surgical History  Procedure Laterality Date  . Tonsillectomy and adenoidectomy  1970  . Bladder tack  11/1995  . Septoplasty  08/1998    with antral window   . Knee arthroscopy  12/12/2002    right   . Fracture surgery  02/09/2006    bilateral elbows  . Fracture surgery      right knee  . Fracture surgery      tib plateau  . Breast cyst aspiration  12/1998    bilaterally  . Breast biopsy  05/20/2000    bilaterally  . Tubal ligation  1970's  . Cataract extraction w/ intraocular lens  implant, bilateral    . Laparoscopic gastric banding  2008  . Cardiac catheterization  2004    SOCIAL HISTORY:    Social History   Social History  . Marital Status: Widowed    Spouse Name: N/A  . Number of Children: 2  . Years of Education: N/A   Occupational History  . 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 Smoking status: Never Smoker   . Smokeless tobacco: Never Used  . Alcohol Use: No     Comment: none  . Drug Use: No  . Sexual Activity: No   Other Topics Concern  . Not on file   Social History Narrative   Lives with daughter and granddaughter.   Daughter is HPOA.   DNR.    FAMILY HISTORY:   Family Status  Relation Status Death Age  . Mother Deceased 32    natural causes  . Father Deceased 1    MI  . Sister Alive     DM-2  . Child Alive     2, HTN, DM-2, hyperlipidemia    ROS:  A complete 10 system review of systems was obtained and was unremarkable apart from what is mentioned above.  PHYSICAL EXAMINATION:    VITALS:   Filed Vitals:   10/20/15 0803  BP: 134/74  Pulse: 76  Height: 5\' 1"  (1.549 m)  Weight: 158 lb (71.668 kg)     GEN:  The patient appears stated age and is in NAD. HEENT:  Normocephalic, atraumatic.  The mucous membranes are moist. The superficial temporal arteries are without ropiness or tenderness. CV:  RRR Lungs:  CTAB Neck/HEME:  There are no carotid bruits bilaterally.  Neurological examination:  Orientation: The patient was alert and oriented x 3.  MocA 04/11/2012 was 25/30, 04/03/13 was 20/30, and 05/07/14 and was 26/30 Cranial nerves: There is L facial droop with decreased forehead wrinkle on the L.  She has a positive stellwags sign. Pupils are equal round and reactive to light bilaterally. Fundoscopic exam reveals clear margins bilaterally. Extraocular muscles are intact.  There are square wave jerks and difficulty with smooth pursuit.  The visual fields are full to confrontational testing. The speech is hypophonic but fluent and clear. She has no trouble repeating gutteral sounds.  Soft palate  rises symmetrically and there is no tongue deviation. Hearing is intact to conversational tone. Sensation: Sensation is intact to light and pinprick throughout (facial, trunk, extremities). Vibration is decreased at the bilateral big toe. There is no extinction with double  simultaneous stimulation. There is no sensory dermatomal level identified. Motor: Strength is 5/5 in the bilateral upper and lower extremities.   Shoulder shrug is equal and symmetric.  There is no pronator drift. Deep tendon reflexes: Deep tendon reflexes are 3/4 at the bilateral biceps, triceps, brachioradialis, and patella.  There are cross adductor reflexes at the knees bilaterally.  Plantar responses are upgoing bilaterally.  Movement examination: Tone: There is normal tone in the bilateral upper extremities today. The tone in the lower extremities is normal.  Abnormal movements: Rare tremor noted today in L hand Coordination:  There is decremation with RAM's, only with heel taps on the L.   Gait and Station: The patient is short stepped and mildly unstable.  She has some difficulty with turns.  LABS:  Lab Results  Component Value Date   WBC 9.5 11/29/2014   HGB 13.2 11/29/2014   HCT 39.5 11/29/2014   MCV 93.6 11/29/2014   PLT 237.0 11/29/2014     Chemistry      Component Value Date/Time   NA 139 11/29/2014 0947   K 4.1 11/29/2014 0947   CL 101 11/29/2014 0947   CO2 32 11/29/2014 0947   BUN 20 11/29/2014 0947   CREATININE 0.71 11/29/2014 0947      Component Value Date/Time   CALCIUM 10.8* 11/29/2014 0947   ALKPHOS 62 11/29/2014 0947   AST 15 11/29/2014 0947   ALT 8 11/29/2014 0947   BILITOT 0.4 11/29/2014 0947     Lab Results  Component Value Date   TSH 2.11 11/29/2014   Lab Results  Component Value Date   VITAMINB12 1387* 11/16/2012    Lab Results  Component Value Date   HGBA1C 6.0 11/29/2014   Lab Results  Component Value Date   ESRSEDRATE 18 04/03/2013    ASSESSMENT/PLAN: 1.   Parkinsonism.  Her primary issue is gait instability with slowness of movement.  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  However, both the patient and her daughter think that levodopa has been beneficial and they think it continues to be beneficial, as I actually considered tapering it.  Neither of them wanted that S. symptoms got worse when I tried this in the past.  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  -she will remain on carbidopa/levodopa 25/100, 3 tablets 3 times per day.  I wanted to decrease this today as she was describing inner restlessness but when we were in the hall she told me that she was restless because she likes to be out of the house and she doesn't want to go down on the medication.  Risks, benefits, side effects and alternative therapies were discussed.  The opportunity to ask questions was given and they were answered to the best of my ability.  The patient expressed understanding and willingness to follow the outlined treatment protocols.  -Safety discussed.  Pt education provided. 2.  Hx of seizure.  This is likely secondary to the initiation of wellbutrin.  She should remain off of this medication.  EEG in the hospital was normal.  She has been seizure-free for several years. 3.  Depression.  I agree with the addition of remeron which has helped sleep, appetite and anxiety.    - I think her depression is situational and related to not being able to drive.  They don't want to pursue  driving eval.   No SI/HI  -I encouraged her to exercise safely.   4.  Memory change  -Because I was not convinced that the patient had a dementia but her previous neuropsych test years ago said that she did and because some of the patient's depression surrounds the fact that her daughter will not let her drive because of this potential diagnosis, we repeated  neuropsych testing in 10/2014.  While there was evidence of cognitive change due to depression, she did not meet the criteria for the diagnosis of dementia.  Her daughter states that "be honest, mama, that your memory is going downhill."  I told her again, that I was not convinced of dementia and neuropsych didn't support this right now.  Pt is not driving.  Dr. Conley Canal and I both gave her information on a medical driving evaluation but her daughter won't allow it. 5.  Chronic lumbar radiculopathy  -She is c/o continued pain but doesn't want PT, although I think that it would be beneficial.  She found PT to be very beneficial in the past.  Will let me know if she changes her mind.    -continue gabapentin to 100 tid.  Helping headache as well so no longer seeing Dr. Domingo Cocking 6.  F/u 6 months, sooner should new issues arise.  Much greater than 50% of this visit was spent in counseling with the patient and the family.  Total face to face time:  25 min

## 2015-10-20 NOTE — Telephone Encounter (Signed)
Rx called in to requested pharmacy 

## 2015-10-22 ENCOUNTER — Ambulatory Visit (INDEPENDENT_AMBULATORY_CARE_PROVIDER_SITE_OTHER): Payer: Medicare Other | Admitting: Family Medicine

## 2015-10-22 ENCOUNTER — Encounter: Payer: Self-pay | Admitting: Family Medicine

## 2015-10-22 VITALS — BP 124/84 | HR 80 | Temp 97.6°F | Ht 61.0 in | Wt 159.0 lb

## 2015-10-22 DIAGNOSIS — R8290 Unspecified abnormal findings in urine: Secondary | ICD-10-CM

## 2015-10-22 DIAGNOSIS — N390 Urinary tract infection, site not specified: Secondary | ICD-10-CM

## 2015-10-22 LAB — POC URINALSYSI DIPSTICK (AUTOMATED)
Bilirubin, UA: NEGATIVE
Blood, UA: NEGATIVE
Glucose, UA: NEGATIVE
Ketones, UA: NEGATIVE
Nitrite, UA: NEGATIVE
Protein, UA: NEGATIVE
Spec Grav, UA: 1.015
Urobilinogen, UA: NEGATIVE
pH, UA: 8.5

## 2015-10-22 MED ORDER — SULFAMETHOXAZOLE-TRIMETHOPRIM 800-160 MG PO TABS: 1.0000 | ORAL_TABLET | Freq: Two times a day (BID) | ORAL | Status: DC

## 2015-10-22 NOTE — Progress Notes (Signed)
SUBJECTIVE: [name] is a 70 y.o. female who complains of dysuria, urinary frequency and strong odor to her urine for x 4 days, without flank pain, fever, chills, or abnormal vaginal discharge or bleeding.   Does have a h/o recurrent UTIs.  Last urine cx- 01/17/15- no growth Urine cx 03/07/14- >100,000 - ? Contaminant, no dominant bacteria  In June, given Bactrim for UTI symptoms.  Current Outpatient Prescriptions on File Prior to Visit  Medication Sig Dispense Refill  . ALPRAZolam (XANAX) 0.5 MG tablet TAKE 1 TABLET BY MOUTH TWICE A DAY AS NEEDED FOR ANXIETY 60 tablet 0  . aspirin 81 MG tablet Take 81 mg by mouth daily.     Marland Kitchen Bioflavonoid Products (GRAPE SEED PO) Take by mouth daily.    . Calcium Carbonate-Vitamin D (CALCIUM + D PO) Take 600 mg by mouth daily.    . carbidopa-levodopa (SINEMET IR) 25-100 MG tablet Take 3 tablets by mouth 3 (three) times daily. 270 tablet 5  . cetirizine (ZYRTEC) 10 MG tablet Take 10 mg by mouth daily.    . Cholecalciferol (VITAMIN D3) 1000 UNITS CAPS Take 1 capsule by mouth daily.      . feeding supplement (ENSURE COMPLETE) LIQD Take 237 mLs by mouth 2 (two) times daily between meals. 1 Bottle 11  . FLUoxetine (PROZAC) 20 MG capsule TAKE ONE CAPSULE BY MOUTH EVERY DAY 90 capsule 0  . gabapentin (NEURONTIN) 100 MG capsule TAKE 1 CAPSULE (100 MG TOTAL) BY MOUTH 3 (THREE) TIMES DAILY. 270 capsule 1  . losartan-hydrochlorothiazide (HYZAAR) 50-12.5 MG per tablet TAKE 1 TABLET BY MOUTH DAILY 90 tablet 3  . mirtazapine (REMERON) 30 MG tablet TAKE 1 TABLET BY MOUTH AT BEDTIME 30 tablet 0  . Multiple Vitamins-Minerals (CENTRUM SILVER ADULT 50+) TABS Take by mouth.    . Ranitidine HCl ([facility] RANITIDINE PO) Reported on 08/26/2015    . simvastatin (ZOCOR) 20 MG tablet TAKE 1 TABLET (20 MG TOTAL) BY MOUTH AT BEDTIME. 90 tablet 3  . vitamin B-12 (CYANOCOBALAMIN) 1000 MCG tablet Take 1,000 mcg by mouth daily.       No current facility-administered medications on  file prior to visit.    Allergies  Allergen Reactions  . Codeine Sulfate     REACTION: hallucinations  . Penicillins Anaphylaxis, Swelling and Rash  . Wellbutrin [Bupropion] Other (See Comments)    Seizures and hallucinations    Past Medical History  Diagnosis Date  . Dysmetabolic syndrome X   . Personal history of thrombophlebitis   . Essential hypertension, benign   . Morbid obesity ([place])   . Mixed hyperlipidemia   . Depressive disorder, not elsewhere classified   . Bell's palsy     1985  . Adjustment disorder with depressed mood   . DVT (deep venous thrombosis), left "early 2000's"    RLE  . Syncope and collapse 12/06/11    "this am; I pass out fairly often"  . Type II or unspecified type diabetes mellitus without mention of complication, not stated as uncontrolled 12/06/11    "not anymore; had lap band OR"  . Neurodegenerative gait disorder   . [id number]) 05/2011    "pretty often since they put me on Parkinson's medicine"  . Parkinson disease [facility])     "they think that's what it is"  . DEMENTIA   . 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 Anxiety   . History of shingles     "  as a teen and in my 39's"    Past Surgical History  Procedure Laterality Date  . Tonsillectomy and adenoidectomy  1970  . Bladder tack  11/1995  . Septoplasty  08/1998    with antral window   . Knee arthroscopy  12/12/2002    right   . Fracture surgery  02/09/2006    bilateral elbows  . Fracture surgery      right knee  . Fracture surgery      tib plateau  . Breast cyst aspiration  12/1998    bilaterally  . Breast biopsy  05/20/2000    bilaterally  . Tubal ligation  1970's  . Cataract extraction w/ intraocular lens  implant, bilateral    . Laparoscopic gastric banding  2008  . Cardiac catheterization  2004    Family History  Problem Relation Age of Onset  . COPD Father   . Diabetes Sister   . Vision loss Sister     legally blind  . Asthma  Mother   . Heart disease Father     Social History   Social History  . Marital Status: Widowed    Spouse Name: N/A  . Number of Children: 2  . Years of Education: N/A   Occupational History  . 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 Smoking status: Never Smoker   . Smokeless tobacco: Never Used  . Alcohol Use: No     Comment: none  . Drug Use: No  . Sexual Activity: No   Other Topics Concern  . Not on file   Social History Narrative   Lives with daughter and granddaughter.   Daughter is HPOA.   DNR.   The PMH, PSH, Social History, Family History, Medications, and allergies have been reviewed in [facility], and have been updated if relevant.  OBJECTIVE:  BP 124/84 mmHg  Pulse 80  Temp(Src) 97.6 F (36.4 C) (Oral)  Ht 5\' 1"  (1.549 m)  Wt 159 lb (72.122 kg)  BMI 30.06 kg/m2  SpO2 96%  Appears well, in no apparent distress.  Vital signs are normal. The abdomen is soft without tenderness, guarding, mass, rebound or organomegaly. No CVA tenderness or inguinal adenopathy noted. Urine dipstick shows:mod LE    ASSESSMENT: UTI  PLAN:  Bactrim DS twice daily x 5 days, send urine for cx.

## 2015-10-22 NOTE — Progress Notes (Signed)
Pre visit review using our clinic review tool, if applicable. No additional management support is needed unless otherwise documented below in the visit note. 

## 2015-10-24 ENCOUNTER — Other Ambulatory Visit: Payer: Self-pay | Admitting: Neurology

## 2015-10-24 LAB — URINE CULTURE
Colony Count: NO GROWTH
Organism ID, Bacteria: NO GROWTH

## 2015-10-24 NOTE — Telephone Encounter (Signed)
Carbidopa Levodopa refill requested. Per last office note- patient to remain on medication. Refill approved and sent to patient's pharmacy.   

## 2015-11-05 ENCOUNTER — Other Ambulatory Visit: Payer: Self-pay | Admitting: Neurology

## 2015-11-06 NOTE — Telephone Encounter (Signed)
Okay to refill? Last note says you agree with addition of Remeron, not sure if you are the one who should be prescribing it. Please advise.

## 2015-11-15 ENCOUNTER — Other Ambulatory Visit: Payer: Self-pay | Admitting: Family Medicine

## 2015-11-17 NOTE — Telephone Encounter (Signed)
Last f/u 11/2014-CPE

## 2015-11-17 NOTE — Telephone Encounter (Signed)
Rx called in to requested pharmacy 

## 2015-11-24 ENCOUNTER — Other Ambulatory Visit: Payer: Self-pay | Admitting: Family Medicine

## 2015-11-24 DIAGNOSIS — E119 Type 2 diabetes mellitus without complications: Secondary | ICD-10-CM

## 2015-11-24 DIAGNOSIS — Z01419 Encounter for gynecological examination (general) (routine) without abnormal findings: Secondary | ICD-10-CM | POA: Insufficient documentation

## 2015-12-01 ENCOUNTER — Other Ambulatory Visit: Payer: Self-pay | Admitting: Family Medicine

## 2015-12-01 ENCOUNTER — Other Ambulatory Visit (INDEPENDENT_AMBULATORY_CARE_PROVIDER_SITE_OTHER): Payer: Medicare Other

## 2015-12-01 DIAGNOSIS — E119 Type 2 diabetes mellitus without complications: Secondary | ICD-10-CM

## 2015-12-01 DIAGNOSIS — Z0000 Encounter for general adult medical examination without abnormal findings: Secondary | ICD-10-CM | POA: Diagnosis not present

## 2015-12-01 DIAGNOSIS — Z1159 Encounter for screening for other viral diseases: Secondary | ICD-10-CM | POA: Diagnosis not present

## 2015-12-01 DIAGNOSIS — Z01419 Encounter for gynecological examination (general) (routine) without abnormal findings: Secondary | ICD-10-CM

## 2015-12-01 DIAGNOSIS — Z7289 Other problems related to lifestyle: Secondary | ICD-10-CM | POA: Diagnosis not present

## 2015-12-01 LAB — CBC WITH DIFFERENTIAL/PLATELET
Basophils Absolute: 0 10*3/uL (ref 0.0–0.1)
Basophils Relative: 0.5 % (ref 0.0–3.0)
Eosinophils Absolute: 0.3 10*3/uL (ref 0.0–0.7)
Eosinophils Relative: 3.2 % (ref 0.0–5.0)
HCT: 38.2 % (ref 36.0–46.0)
Hemoglobin: 12.7 g/dL (ref 12.0–15.0)
Lymphocytes Relative: 25.1 % (ref 12.0–46.0)
Lymphs Abs: 2 10*3/uL (ref 0.7–4.0)
MCHC: 33.4 g/dL (ref 30.0–36.0)
MCV: 93.3 fl (ref 78.0–100.0)
Monocytes Absolute: 0.7 10*3/uL (ref 0.1–1.0)
Monocytes Relative: 9.1 % (ref 3.0–12.0)
Neutro Abs: 4.9 10*3/uL (ref 1.4–7.7)
Neutrophils Relative %: 62.1 % (ref 43.0–77.0)
Platelets: 221 10*3/uL (ref 150.0–400.0)
RBC: 4.09 Mil/uL (ref 3.87–5.11)
RDW: 13.7 % (ref 11.5–15.5)
WBC: 7.9 10*3/uL (ref 4.0–10.5)

## 2015-12-01 LAB — COMPREHENSIVE METABOLIC PANEL
ALT: 8 U/L (ref 0–35)
AST: 15 U/L (ref 0–37)
Albumin: 3.9 g/dL (ref 3.5–5.2)
Alkaline Phosphatase: 63 U/L (ref 39–117)
BUN: 22 mg/dL (ref 6–23)
CO2: 31 mEq/L (ref 19–32)
Calcium: 10.3 mg/dL (ref 8.4–10.5)
Chloride: 103 mEq/L (ref 96–112)
Creatinine, Ser: 0.66 mg/dL (ref 0.40–1.20)
GFR: 93.85 mL/min (ref >60.00)
Glucose, Bld: 103 mg/dL — ABNORMAL HIGH (ref 70–99)
Potassium: 4 mEq/L (ref 3.5–5.1)
Sodium: 142 mEq/L (ref 135–145)
Total Bilirubin: 0.2 mg/dL (ref 0.2–1.2)
Total Protein: 6.4 g/dL (ref 6.0–8.3)

## 2015-12-01 LAB — HEMOGLOBIN A1C: Hgb A1c MFr Bld: 6.2 % (ref 4.6–6.5)

## 2015-12-01 LAB — LIPID PANEL
Cholesterol: 142 mg/dL (ref 0–200)
HDL: 59 mg/dL (ref >39.00)
LDL Cholesterol: 67 mg/dL (ref 0–99)
NonHDL: 82.86
Total CHOL/HDL Ratio: 2
Triglycerides: 81 mg/dL (ref 0.0–149.0)
VLDL: 16.2 mg/dL (ref 0.0–40.0)

## 2015-12-01 LAB — TSH: TSH: 1.82 u[IU]/mL (ref 0.35–4.50)

## 2015-12-02 LAB — HEPATITIS C ANTIBODY: HCV Ab: NEGATIVE

## 2015-12-04 ENCOUNTER — Encounter: Payer: Self-pay | Admitting: Family Medicine

## 2015-12-04 ENCOUNTER — Ambulatory Visit (INDEPENDENT_AMBULATORY_CARE_PROVIDER_SITE_OTHER): Payer: Medicare Other | Admitting: Family Medicine

## 2015-12-04 VITALS — BP 134/68 | HR 72 | Temp 97.6°F | Ht 62.5 in | Wt 158.5 lb

## 2015-12-04 DIAGNOSIS — F341 Dysthymic disorder: Secondary | ICD-10-CM | POA: Diagnosis not present

## 2015-12-04 DIAGNOSIS — Z0000 Encounter for general adult medical examination without abnormal findings: Secondary | ICD-10-CM

## 2015-12-04 DIAGNOSIS — E119 Type 2 diabetes mellitus without complications: Secondary | ICD-10-CM

## 2015-12-04 DIAGNOSIS — E782 Mixed hyperlipidemia: Secondary | ICD-10-CM | POA: Diagnosis not present

## 2015-12-04 DIAGNOSIS — Z01419 Encounter for gynecological examination (general) (routine) without abnormal findings: Secondary | ICD-10-CM

## 2015-12-04 DIAGNOSIS — I10 Essential (primary) hypertension: Secondary | ICD-10-CM

## 2015-12-04 NOTE — Assessment & Plan Note (Signed)
Remains diet controlled. No changes made to rxs today.

## 2015-12-04 NOTE — Assessment & Plan Note (Signed)
Reviewed preventive care protocols, scheduled due services, and updated immunizations Discussed nutrition, exercise, diet, and healthy lifestyle.  

## 2015-12-04 NOTE — Assessment & Plan Note (Signed)
Well controlled. PHQ of 1 today. No changes made.

## 2015-12-04 NOTE — Progress Notes (Signed)
Pre visit review using our clinic review tool, if applicable. No additional management support is needed unless otherwise documented below in the visit note. 

## 2015-12-04 NOTE — Patient Instructions (Signed)
Good to see you. Have a happy birthday!

## 2015-12-04 NOTE — Progress Notes (Signed)
Subjective:    Patient ID: [name], female    DOB: [birth year], 70 y.o.   MRN: [id number]  HPI  70 yo pleasant female here with her daughter for AMW visit, CPE and follow up of chronic medical conditions.  I have personally reviewed the Medicare Annual Wellness questionnaire and have noted 1. The patient's medical and social history 2. Their use of alcohol, tobacco or illicit drugs 3. Their current medications and supplements 4. The patient's functional ability including ADL's, fall risks, home safety risks and hearing or visual impairment. 5. Diet and physical activities 6. Evidence for depression or mood disorders  Remote h/o hysterectomy Mammogram 04/29/15 Td 06/21/12 Zostavax 04/18/08 Pneumovax 04/16/10 prevnar 13 11/26/13 Last eye exam 12/07/13 Refuses colonoscopy but has been doing IFOB- neg IFB on 12/13/14  End of life wishes discussed and updated in Social History.  The roster of all physicians providing medical care to patient - is listed in the [facility] section of the chart.   PSP- followed by Dr. Carles Collet.   Was last see non 10/20/15.  Note reviewed.   HLD- decreased zocor to 20 mg daily- well controlled on lower dose.  Lab Results  Component Value Date   CHOL 142 12/01/2015   HDL 59.00 12/01/2015   LDLCALC 67 12/01/2015   TRIG 81.0 12/01/2015   CHOLHDL 2 12/01/2015   Lab Results  Component Value Date   CREATININE 0.66 12/01/2015   DM- diet controlled.   Lab Results  Component Value Date   HGBA1C 6.2 12/01/2015     Patient Active Problem List   Diagnosis Date Noted  . Medicare annual wellness visit, subsequent 12/04/2015  . Well woman exam 11/24/2015  . GERD (gastroesophageal reflux disease) 08/26/2015  . Recurrent UTI 05/27/2015  . Diabetes mellitus type 2, diet-controlled ([place]) 12/03/2014  . History of recurrent UTIs 06/13/2014  . DNR (do not resuscitate) 11/23/2012  . Seizures ([place]) 04/03/2012  . Dysphagia 12/07/2011  . Orthostasis  12/07/2011  . Bell's palsy   . Loss of perception for taste 04/07/2011  . Loss, sense of, smell 04/07/2011  . Hypersomnia 01/18/2011  . OSTEOPENIA 05/21/2010  . ANXIETY DEPRESSION 04/16/2010  . OBESITY, MORBID 02/16/2007  . HYPERLIPIDEMIA, MIXED 01/31/2007  . HYPERTENSION, BENIGN ESSENTIAL 01/31/2007   Past Medical History  Diagnosis Date  . Dysmetabolic syndrome X   . Personal history of thrombophlebitis   . Essential hypertension, benign   . Morbid obesity ([place])   . Mixed hyperlipidemia   . Depressive disorder, not elsewhere classified   . Bell's palsy     1985  . Adjustment disorder with depressed mood   . DVT (deep venous thrombosis), left "early 2000's"    RLE  . Syncope and collapse 12/06/11    "this am; I pass out fairly often"  . Type II or unspecified type diabetes mellitus without mention of complication, not stated as uncontrolled 12/06/11    "not anymore; had lap band OR"  . Neurodegenerative gait disorder   . [id number]) 05/2011    "pretty often since they put me on Parkinson's medicine"  . Parkinson disease [facility])     "they think that's what it is"  . DEMENTIA   . 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 Anxiety   . History of shingles     "as a teen and in my 64's"   Past Surgical History  Procedure Laterality Date  . Tonsillectomy and adenoidectomy  1970  .  Bladder tack  11/1995  . Septoplasty  08/1998    with antral window   . Knee arthroscopy  12/12/2002    right   . Fracture surgery  02/09/2006    bilateral elbows  . Fracture surgery      right knee  . Fracture surgery      tib plateau  . Breast cyst aspiration  12/1998    bilaterally  . Breast biopsy  05/20/2000    bilaterally  . Tubal ligation  1970's  . Cataract extraction w/ intraocular lens  implant, bilateral    . Laparoscopic gastric banding  2008  . Cardiac catheterization  2004   Social History  Substance Use Topics  . Smoking status: Never  Smoker   . Smokeless tobacco: Never Used  . Alcohol Use: No     Comment: none   Family History  Problem Relation Age of Onset  . COPD Father   . Diabetes Sister   . Vision loss Sister     legally blind  . Asthma Mother   . Heart disease Father    Allergies  Allergen Reactions  . Codeine Sulfate     REACTION: hallucinations  . Penicillins Anaphylaxis, Swelling and Rash  . Wellbutrin [Bupropion] Other (See Comments)    Seizures and hallucinations   Current Outpatient Prescriptions on File Prior to Visit  Medication Sig Dispense Refill  . ALPRAZolam (XANAX) 0.5 MG tablet Take 1 tablet (0.5 mg total) by mouth 2 (two) times daily as needed for anxiety. OFFICE VISIT REQUIRED FOR ADDITIONAL REFILLS 60 tablet 0  . aspirin 81 MG tablet Take 81 mg by mouth daily.     Marland Kitchen Bioflavonoid Products (GRAPE SEED PO) Take by mouth daily.    . Calcium Carbonate-Vitamin D (CALCIUM + D PO) Take 600 mg by mouth daily.    . carbidopa-levodopa (SINEMET IR) 25-100 MG tablet TAKE 3 TABLETS BY MOUTH 3 TIMES A DAY 270 tablet 5  . cetirizine (ZYRTEC) 10 MG tablet Take 10 mg by mouth daily.    . Cholecalciferol (VITAMIN D3) 1000 UNITS CAPS Take 1 capsule by mouth daily.      . feeding supplement (ENSURE COMPLETE) LIQD Take 237 mLs by mouth 2 (two) times daily between meals. 1 Bottle 11  . FLUoxetine (PROZAC) 20 MG capsule TAKE ONE CAPSULE BY MOUTH EVERY DAY 90 capsule 0  . gabapentin (NEURONTIN) 100 MG capsule TAKE 1 CAPSULE (100 MG TOTAL) BY MOUTH 3 (THREE) TIMES DAILY. 270 capsule 1  . losartan-hydrochlorothiazide (HYZAAR) 50-12.5 MG per tablet TAKE 1 TABLET BY MOUTH DAILY 90 tablet 3  . mirtazapine (REMERON) 30 MG tablet TAKE 1 TABLET BY MOUTH AT BEDTIME 30 tablet 0  . Multiple Vitamins-Minerals (CENTRUM SILVER ADULT 50+) TABS Take by mouth.    . Ranitidine HCl ([facility] RANITIDINE PO) Reported on 08/26/2015    . simvastatin (ZOCOR) 20 MG tablet TAKE 1 TABLET (20 MG TOTAL) BY MOUTH AT BEDTIME. 90 tablet 3  .  vitamin B-12 (CYANOCOBALAMIN) 1000 MCG tablet Take 1,000 mcg by mouth daily.       No current facility-administered medications on file prior to visit.   The PMH, PSH, Social History, Family History, Medications, and allergies have been reviewed in [facility], and have been updated if relevant.  Review of Systems  Constitutional: Negative.   HENT: Negative.   Respiratory: Negative.   Cardiovascular: Negative.   Endocrine: Negative.   Genitourinary: Negative.   Musculoskeletal: Positive for gait problem.  Skin: Negative.  Allergic/Immunologic: Negative.   Neurological: Negative.   Psychiatric/Behavioral: Positive for dysphoric mood. Negative for suicidal ideas and self-injury.  All other systems reviewed and are negative.     Objective:   Physical Exam BP 134/68 mmHg  Pulse 72  Temp(Src) 97.6 F (36.4 C) (Oral)  Ht 5' 2.5" (1.588 m)  Wt 158 lb 8 oz (71.895 kg)  BMI 28.51 kg/m2  SpO2 96%  General: Well-developed,well-nourished,in no acute distress; alert,appropriate and cooperative throughout examination, minimally overweight.  Mouth: Oral mucosa and oropharynx without lesions or exudates. Teeth in good repair. Left facial paralysis unchanged.  Neck: No deformities, masses, or tenderness noted.  No bruits  Lungs: Normal respiratory effort, chest expands symmetrically. Lungs are clear to auscultation, no crackles or wheezes.  Heart: Normal rate and regular rhythm. S1 and S2 normal without gallop, murmur, click, rub or other extra sounds.  Abdomen: Bowel sounds positive,abdomen soft and non-tender without masses, organomegaly or hernias noted. NO CVA or suprapubic tenderness.  Extremities: No clubbing, cyanosis, edema, or deformity noted with normal full range of motion of all joints.  Neurologic: No cranial nerve deficits noted. Station and gait are normal. Sensory, motor and coordinative functions appear intact.  Psych: Cognition and judgment appear intact. Alert and cooperative  with normal attention span and concentration. No apparent delusions, illusions, hallucinations      Assessment & Plan:

## 2015-12-04 NOTE — Assessment & Plan Note (Signed)
Well controlled. No changes made today. 

## 2015-12-04 NOTE — Assessment & Plan Note (Addendum)
The patients weight, height, BMI and visual acuity have been recorded in the chart.  Cognitive function assessed.   I have made referrals, counseling and provided education to the patient based review of the above and I have provided the pt with a written personalized care plan for preventive services.  Cologuard ordered.

## 2015-12-06 ENCOUNTER — Other Ambulatory Visit: Payer: Self-pay | Admitting: Neurology

## 2015-12-08 NOTE — Telephone Encounter (Signed)
Remeron refill requested. Per last office note- patient to remain on medication. Refill approved and sent to patient's pharmacy.   

## 2015-12-10 DIAGNOSIS — L821 Other seborrheic keratosis: Secondary | ICD-10-CM | POA: Diagnosis not present

## 2015-12-10 DIAGNOSIS — D485 Neoplasm of uncertain behavior of skin: Secondary | ICD-10-CM | POA: Diagnosis not present

## 2015-12-10 DIAGNOSIS — D239 Other benign neoplasm of skin, unspecified: Secondary | ICD-10-CM | POA: Diagnosis not present

## 2015-12-14 ENCOUNTER — Other Ambulatory Visit: Payer: Self-pay | Admitting: Family Medicine

## 2015-12-15 DIAGNOSIS — Z1211 Encounter for screening for malignant neoplasm of colon: Secondary | ICD-10-CM | POA: Diagnosis not present

## 2015-12-15 DIAGNOSIS — Z1212 Encounter for screening for malignant neoplasm of rectum: Secondary | ICD-10-CM | POA: Diagnosis not present

## 2015-12-15 NOTE — Telephone Encounter (Signed)
Last f/u 11/2015-CPE 

## 2015-12-16 NOTE — Telephone Encounter (Signed)
Rx called in to requested pharmacy 

## 2015-12-27 LAB — COLOGUARD

## 2015-12-30 ENCOUNTER — Other Ambulatory Visit: Payer: Self-pay | Admitting: Family Medicine

## 2016-01-01 ENCOUNTER — Encounter: Payer: Self-pay | Admitting: Family Medicine

## 2016-01-09 LAB — HM DIABETES EYE EXAM

## 2016-01-12 ENCOUNTER — Other Ambulatory Visit: Payer: Self-pay | Admitting: Family Medicine

## 2016-01-13 NOTE — Telephone Encounter (Signed)
Rx called in to requested pharmacy 

## 2016-01-13 NOTE — Telephone Encounter (Signed)
Last f/u 11/2015-CPE 

## 2016-01-15 ENCOUNTER — Encounter: Payer: Self-pay | Admitting: Family Medicine

## 2016-02-12 ENCOUNTER — Other Ambulatory Visit: Payer: Self-pay | Admitting: Family Medicine

## 2016-02-12 NOTE — Telephone Encounter (Signed)
Last f/u 11/2015-CPE 

## 2016-02-12 NOTE — Telephone Encounter (Signed)
Rx called in to requested pharmacy 

## 2016-03-01 ENCOUNTER — Other Ambulatory Visit: Payer: Self-pay | Admitting: Family Medicine

## 2016-03-09 ENCOUNTER — Other Ambulatory Visit: Payer: Self-pay | Admitting: Neurology

## 2016-03-14 ENCOUNTER — Other Ambulatory Visit: Payer: Self-pay | Admitting: Family Medicine

## 2016-03-16 NOTE — Telephone Encounter (Signed)
Rx called in to requested pharmacy 

## 2016-03-16 NOTE — Telephone Encounter (Signed)
Last f/u 11/2015-CPE 

## 2016-03-23 ENCOUNTER — Emergency Department (HOSPITAL_COMMUNITY): Payer: Medicare Other

## 2016-03-23 ENCOUNTER — Emergency Department (HOSPITAL_COMMUNITY)
Admission: EM | Admit: 2016-03-23 | Discharge: 2016-03-23 | Disposition: A | Discharge status: Home / Self Care | Payer: Medicare Other | Attending: Emergency Medicine | Admitting: Emergency Medicine

## 2016-03-23 ENCOUNTER — Encounter (HOSPITAL_COMMUNITY): Payer: Self-pay | Admitting: Emergency Medicine

## 2016-03-23 DIAGNOSIS — W0110XA Fall on same level from slipping, tripping and stumbling with subsequent striking against unspecified object, initial encounter: Secondary | ICD-10-CM | POA: Diagnosis not present

## 2016-03-23 DIAGNOSIS — Y9389 Activity, other specified: Secondary | ICD-10-CM | POA: Insufficient documentation

## 2016-03-23 DIAGNOSIS — Z7901 Long term (current) use of anticoagulants: Secondary | ICD-10-CM | POA: Diagnosis not present

## 2016-03-23 DIAGNOSIS — G20 Parkinson's disease: Secondary | ICD-10-CM | POA: Insufficient documentation

## 2016-03-23 DIAGNOSIS — Z7982 Long term (current) use of aspirin: Secondary | ICD-10-CM | POA: Insufficient documentation

## 2016-03-23 DIAGNOSIS — Z79899 Other long term (current) drug therapy: Secondary | ICD-10-CM | POA: Diagnosis not present

## 2016-03-23 DIAGNOSIS — E119 Type 2 diabetes mellitus without complications: Secondary | ICD-10-CM | POA: Diagnosis not present

## 2016-03-23 DIAGNOSIS — S79912A Unspecified injury of left hip, initial encounter: Secondary | ICD-10-CM | POA: Diagnosis not present

## 2016-03-23 DIAGNOSIS — M25562 Pain in left knee: Secondary | ICD-10-CM | POA: Diagnosis not present

## 2016-03-23 DIAGNOSIS — I10 Essential (primary) hypertension: Secondary | ICD-10-CM | POA: Diagnosis not present

## 2016-03-23 DIAGNOSIS — Y999 Unspecified external cause status: Secondary | ICD-10-CM | POA: Insufficient documentation

## 2016-03-23 DIAGNOSIS — Y92009 Unspecified place in unspecified non-institutional (private) residence as the place of occurrence of the external cause: Secondary | ICD-10-CM | POA: Insufficient documentation

## 2016-03-23 DIAGNOSIS — T148 Other injury of unspecified body region: Secondary | ICD-10-CM | POA: Diagnosis not present

## 2016-03-23 DIAGNOSIS — M25552 Pain in left hip: Secondary | ICD-10-CM

## 2016-03-23 DIAGNOSIS — S199XXA Unspecified injury of neck, initial encounter: Secondary | ICD-10-CM | POA: Diagnosis not present

## 2016-03-23 DIAGNOSIS — W19XXXA Unspecified fall, initial encounter: Secondary | ICD-10-CM

## 2016-03-23 DIAGNOSIS — R51 Headache: Secondary | ICD-10-CM | POA: Diagnosis not present

## 2016-03-23 DIAGNOSIS — S0990XA Unspecified injury of head, initial encounter: Secondary | ICD-10-CM | POA: Diagnosis not present

## 2016-03-23 LAB — URINALYSIS, ROUTINE W REFLEX MICROSCOPIC
Bilirubin Urine: NEGATIVE
Glucose, UA: NEGATIVE mg/dL
Hgb urine dipstick: NEGATIVE
Ketones, ur: 15 mg/dL — AB
Nitrite: NEGATIVE
Protein, ur: NEGATIVE mg/dL
Specific Gravity, Urine: 1.024 (ref 1.005–1.030)
pH: 7.5 (ref 5.0–8.0)

## 2016-03-23 LAB — CBC WITH DIFFERENTIAL/PLATELET
Basophils Absolute: 0 10*3/uL (ref 0.0–0.1)
Basophils Relative: 0 %
Eosinophils Absolute: 0.2 10*3/uL (ref 0.0–0.7)
Eosinophils Relative: 2 %
HCT: 41.4 % (ref 36.0–46.0)
Hemoglobin: 13.2 g/dL (ref 12.0–15.0)
Lymphocytes Relative: 14 %
Lymphs Abs: 1.4 10*3/uL (ref 0.7–4.0)
MCH: 31.1 pg (ref 26.0–34.0)
MCHC: 31.9 g/dL (ref 30.0–36.0)
MCV: 97.6 fL (ref 78.0–100.0)
Monocytes Absolute: 0.7 10*3/uL (ref 0.1–1.0)
Monocytes Relative: 8 %
Neutro Abs: 7.2 10*3/uL (ref 1.7–7.7)
Neutrophils Relative %: 76 %
Platelets: 215 10*3/uL (ref 150–400)
RBC: 4.24 MIL/uL (ref 3.87–5.11)
RDW: 13.4 % (ref 11.5–15.5)
WBC: 9.5 10*3/uL (ref 4.0–10.5)

## 2016-03-23 LAB — URINE MICROSCOPIC-ADD ON

## 2016-03-23 LAB — BASIC METABOLIC PANEL WITH GFR
Anion gap: 7 (ref 5–15)
BUN: 17 mg/dL (ref 6–20)
CO2: 31 mmol/L (ref 22–32)
Calcium: 10 mg/dL (ref 8.9–10.3)
Chloride: 103 mmol/L (ref 101–111)
Creatinine, Ser: 0.62 mg/dL (ref 0.44–1.00)
GFR calc Af Amer: >60 mL/min
GFR calc non Af Amer: >60 mL/min
Glucose, Bld: 113 mg/dL — ABNORMAL HIGH (ref 65–99)
Potassium: 4.6 mmol/L (ref 3.5–5.1)
Sodium: 141 mmol/L (ref 135–145)

## 2016-03-23 MED ORDER — ACETAMINOPHEN 325 MG PO TABS
650.0000 mg | ORAL_TABLET | Freq: Once | ORAL | Status: AC
  Administered 2016-03-23: 650 mg via ORAL
  Filled 2016-03-23: qty 2

## 2016-03-23 MED ORDER — TRAMADOL HCL 50 MG PO TABS
50.0000 mg | ORAL_TABLET | Freq: Once | ORAL | Status: AC
  Administered 2016-03-23: 50 mg via ORAL
  Filled 2016-03-23: qty 1

## 2016-03-23 MED ORDER — TRAMADOL HCL 50 MG PO TABS: 50.0000 mg | ORAL_TABLET | Freq: Four times a day (QID) | ORAL | 0 refills | Status: DC | PRN

## 2016-03-23 NOTE — ED Notes (Signed)
Patient is not in the room at this time. 

## 2016-03-23 NOTE — ED Notes (Signed)
Patient has returned back from the bathroom; placed back on monitor, continuous pulse oximetry and blood pressure cuff; visitor at bedside; warm blanket given

## 2016-03-23 NOTE — ED Notes (Signed)
Patient up and ambulated to the bathroom without any difficulty or distress; visitor at bedside

## 2016-03-23 NOTE — ED Triage Notes (Signed)
Arrived via EMS patient cleaning a clogged toilet and tripped on towel on the floor. States hit posterior head no pain and landed on left side. Pain currently left hip 6/10 achy dull blanket binder applied by EMS. Report no shortening or rotation. Does have left knee abrasion bleeding controlled. Alert answering and following commands appropriate.

## 2016-03-23 NOTE — Discharge Instructions (Signed)
Your imaging and lab work today was normal. Take the tramadol as directed for pain. If you do not need this, you do not have to take it. Use with caution, it can make you sleepy and/or drowsy. Follow-up with your primary care doctor. Return here for any new or worsening symptoms.

## 2016-03-23 NOTE — ED Notes (Signed)
Assisted Levada Dy, RN with placing patient on bedpan; also was able to obtain an urine sample

## 2016-03-23 NOTE — ED Provider Notes (Signed)
[facility] DEPT Provider Note   CSN: [id number] Arrival date & time: 03/23/16  1140     History   Chief Complaint Chief Complaint  Patient presents with  . Fall  . Hip Pain    [name] is a 70 y.o. female.  The history is provided by the patient, a relative and medical records.    70 y.o. F with hx of anxiety, bell's palsy, dementia, depression, hx of DVT not currently on anti-coagulation, HTN, HLP, DM, presenting to the ED for a fall.  Patient states A toilet in her home overflow this morning. She states she was trying to unclog the toilet with a plunger and had Lasix and towels on the floor to soak up the excess water. States she tripped over the talus and landed on her left side. She did strike her head on the floor, no loss of consciousness.  She was not able to get up out of the floor on her own.  EMS was called and paramedics helped her up onto the stretcher.  She states mostly she has left hip pain but has developed a mild headache since arriving to the ED.  No dizziness, numbness, or weakness.  Denies any recent chest pain, shortness of breath, fever, chills, or other recent illness. No episodes of syncope. Patient's daughter who is present at the bedside also reports this is her second fall within 1 week. States in the past when she has had several falls she was found to have a UTI. Patient denies any current urinary symptoms. She is not having any back pain.  Past Medical History:  Diagnosis Date  . Adjustment disorder with depressed mood   . Anxiety   . Bell's palsy    1985  . DEMENTIA   . Depressive disorder, not elsewhere classified   . 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 DVT (deep venous thrombosis), left "early 2000's"   RLE  . Dysmetabolic syndrome X   . Essential hypertension, benign   . [id number]) 05/2011   "pretty often since they put me on Parkinson's medicine"  . History of shingles    "as a teen and in  my 41's"  . Mixed hyperlipidemia   . Morbid obesity ([place])   . Neurodegenerative gait disorder   . Parkinson disease [facility])    "they think that's what it is"  . Personal history of thrombophlebitis   . Syncope and collapse 12/06/11   "this am; I pass out fairly often"  . Type II or unspecified type diabetes mellitus without mention of complication, not stated as uncontrolled 12/06/11   "not anymore; had lap band OR"    Patient Active Problem List   Diagnosis Date Noted  . Medicare annual wellness visit, subsequent 12/04/2015  . Well woman exam 11/24/2015  . GERD (gastroesophageal reflux disease) 08/26/2015  . Recurrent UTI 05/27/2015  . Diabetes mellitus type 2, diet-controlled ([place]) 12/03/2014  . History of recurrent UTIs 06/13/2014  . DNR (do not resuscitate) 11/23/2012  . Seizures ([place]) 04/03/2012  . Dysphagia 12/07/2011  . Orthostasis 12/07/2011  . Bell's palsy   . Loss of perception for taste 04/07/2011  . Loss, sense of, smell 04/07/2011  . Hypersomnia 01/18/2011  . OSTEOPENIA 05/21/2010  . ANXIETY DEPRESSION 04/16/2010  . OBESITY, MORBID 02/16/2007  . HYPERLIPIDEMIA, MIXED 01/31/2007  . HYPERTENSION, BENIGN ESSENTIAL 01/31/2007    Past Surgical History:  Procedure Laterality Date  . bladder tack  11/1995  . BREAST BIOPSY  05/20/2000   bilaterally  . BREAST CYST ASPIRATION  12/1998   bilaterally  . CARDIAC CATHETERIZATION  2004  . CATARACT EXTRACTION W/ INTRAOCULAR LENS  IMPLANT, BILATERAL    . FRACTURE SURGERY  02/09/2006   bilateral elbows  . FRACTURE SURGERY     right knee  . FRACTURE SURGERY     tib plateau  . KNEE ARTHROSCOPY  12/12/2002   right   . LAPAROSCOPIC GASTRIC BANDING  2008  . SEPTOPLASTY  08/1998   with antral window   . TONSILLECTOMY AND ADENOIDECTOMY  1970  . TUBAL LIGATION  1970's    OB History    No data available       Home Medications    Prior to Admission medications   Medication Sig Start Date End Date Taking? Authorizing  Provider  ALPRAZolam Duanne Moron) 0.5 MG tablet TAKE 1 TABLET BY MOUTH TWICE A DAY AS NEEDED 03/16/16   Lucille Passy, MD  aspirin 81 MG tablet Take 81 mg by mouth daily.     Historical Provider, MD  Bioflavonoid Products (GRAPE SEED PO) Take by mouth daily.    Historical Provider, MD  Calcium Carbonate-Vitamin D (CALCIUM + D PO) Take 600 mg by mouth daily.    Historical Provider, MD  carbidopa-levodopa (SINEMET IR) 25-100 MG tablet TAKE 3 TABLETS BY MOUTH 3 TIMES A DAY 10/24/15   Rebecca S Tat, DO  cetirizine (ZYRTEC) 10 MG tablet Take 10 mg by mouth daily.    Historical Provider, MD  Cholecalciferol (VITAMIN D3) 1000 UNITS CAPS Take 1 capsule by mouth daily.      Historical Provider, MD  feeding supplement (ENSURE COMPLETE) LIQD Take 237 mLs by mouth 2 (two) times daily between meals. 04/01/12   Robbie Lis, MD  FLUoxetine (PROZAC) 20 MG capsule TAKE ONE CAPSULE BY MOUTH EVERY DAY 12/30/15   Lucille Passy, MD  gabapentin (NEURONTIN) 100 MG capsule TAKE 1 CAPSULE (100 MG TOTAL) BY MOUTH 3 (THREE) TIMES DAILY. 10/01/15   Eustace Quail Tat, DO  losartan-hydrochlorothiazide (HYZAAR) 50-12.5 MG tablet TAKE 1 TABLET BY MOUTH DAILY 03/02/16   Lucille Passy, MD  mirtazapine (REMERON) 30 MG tablet TAKE 1 TABLET BY MOUTH AT BEDTIME 03/09/16   Eustace Quail Tat, DO  Multiple Vitamins-Minerals (CENTRUM SILVER ADULT 50+) TABS Take by mouth.    Historical Provider, MD  Ranitidine HCl ([facility] RANITIDINE PO) Reported on 08/26/2015    Historical Provider, MD  simvastatin (ZOCOR) 20 MG tablet TAKE 1 TABLET (20 MG TOTAL) BY MOUTH AT BEDTIME. 05/28/15   Lucille Passy, MD  vitamin B-12 (CYANOCOBALAMIN) 1000 MCG tablet Take 1,000 mcg by mouth daily.      Historical Provider, MD    Family History Family History  Problem Relation Age of Onset  . Asthma Mother   . COPD Father   . Heart disease Father   . Diabetes Sister   . Vision loss Sister     legally blind    Social History Social History  Substance Use Topics  . Smoking status:  Never Smoker  . Smokeless tobacco: Never Used  . Alcohol use No     Comment: none     Allergies   Codeine sulfate; Penicillins; and Wellbutrin [bupropion]   Review of Systems Review of Systems  Musculoskeletal: Positive for arthralgias.  Neurological: Positive for headaches.  All other systems reviewed and are negative.    Physical Exam Updated Vital Signs BP 141/59  Pulse 70   Temp 98.1 F (36.7 C) (Oral)   Resp 18   Ht 5\' 1"  (1.549 m)   Wt 72.6 kg   SpO2 95%   BMI 30.23 kg/m   Physical Exam  Constitutional: She is oriented to person, place, and time. She appears well-developed and well-nourished.  HENT:  Head: Normocephalic and atraumatic.  Mouth/Throat: Oropharynx is clear and moist.  No visible signs of head trauma  Eyes: Conjunctivae and EOM are normal. Pupils are equal, round, and reactive to light.  Neck: Normal range of motion.  Cardiovascular: Normal rate, regular rhythm and normal heart sounds.   Pulmonary/Chest: Effort normal and breath sounds normal.  Abdominal: Soft. Bowel sounds are normal.  Musculoskeletal: Normal range of motion.  Hips in sheet binder currently; left hip TTP along lateral aspect; no leg shortening or malrotation noted; no tenderness the femur, knee, or lower leg; DP pulses intact; normal sensation of left leg Right leg and both are atraumatic and non-tender  Neurological: She is alert and oriented to person, place, and time.  AAOx3, answering questions and following commands appropriately; equal strength UE and LE bilaterally; CN grossly intact; no noted ataxia, normal speech, no facial droop  Skin: Skin is warm and dry.  Psychiatric: She has a normal mood and affect.  Nursing note and vitals reviewed.    ED Treatments / Results  Labs (all labs ordered are listed, but only abnormal results are displayed) Labs Reviewed  BASIC METABOLIC PANEL - Abnormal; Notable for the following:       Result Value   Glucose, Bld 113 (*)     All other components within normal limits  URINALYSIS, ROUTINE W REFLEX MICROSCOPIC (NOT AT [facility]) - Abnormal; Notable for the following:    APPearance CLOUDY (*)    Ketones, ur 15 (*)    Leukocytes, UA TRACE (*)    All other components within normal limits  URINE MICROSCOPIC-ADD ON - Abnormal; Notable for the following:    Squamous Epithelial / LPF 0-5 (*)    Bacteria, UA FEW (*)    All other components within normal limits  CBC WITH DIFFERENTIAL/PLATELET    EKG  EKG Interpretation  Date/Time:  Tuesday March 23 2016 13:07:34 EDT Ventricular Rate:  70 PR Interval:    QRS Duration: 95 QT Interval:  427 QTC Calculation: 461 R Axis:   -72 Text Interpretation:  Sinus rhythm Abnormal R-wave progression, early transition Inferior infarct, old no significant change since 2013 Confirmed by GOLDSTON MD, SCOTT [phone]) on 03/23/2016 1:33:49 PM       Radiology No results found.  Procedures Procedures (including critical care time)  Medications Ordered in ED Medications - No data to display   Initial Impression / Assessment and Plan / ED Course  I have reviewed the triage vital signs and the nursing notes.  Pertinent labs & imaging results that were available during my care of the patient were reviewed by me and considered in my medical decision making (see chart for details).  Clinical Course   70 year old female here with a fall at her home on trying to unclog the toilet. This was a mechanical fall as she tripped over some towels. She did strike her head, no loss of consciousness. She has no visible signs of head injury on exam. She is awake, alert, oriented to her baseline. She does have some tenderness of the left lateral hip without any deformity or leg shortening. Screening lab work is reassuring. No signs of UTI.  Her imaging is negative for any acute injuries. Patient's pain controlled well here with tramadol. She was ambulatory in the ED independently without any difficulty. No  dizziness or weakness upon standing. Feel she is appropriate for discharge. She will follow-up with her primary care doctor.  Discussed plan with patient and daughter at bedside, they acknowledged understanding and agreed with plan of care.  Return precautions given for new or worsening symptoms.  Final Clinical Impressions(s) / ED Diagnoses   Final diagnoses:  Left hip pain  Fall, initial encounter    New Prescriptions Discharge Medication List as of 03/23/2016  4:44 PM    START taking these medications   Details  traMADol (ULTRAM) 50 MG tablet Take 1 tablet (50 mg total) by mouth every 6 (six) hours as needed., Starting Tue 03/23/2016, Print         Larene Pickett, PA-C 03/23/16 1808    Sherwood Gambler, MD 03/24/16 [phone]

## 2016-04-13 ENCOUNTER — Other Ambulatory Visit: Payer: Self-pay | Admitting: Family Medicine

## 2016-04-13 ENCOUNTER — Other Ambulatory Visit: Payer: Self-pay | Admitting: Neurology

## 2016-04-13 MED ORDER — MIRTAZAPINE 30 MG PO TABS: 30.0000 mg | ORAL_TABLET | Freq: Every day | ORAL | 0 refills | Status: DC

## 2016-04-13 MED ORDER — CARBIDOPA-LEVODOPA 25-100 MG PO TABS: ORAL_TABLET | ORAL | 0 refills | Status: DC

## 2016-04-14 NOTE — Telephone Encounter (Signed)
Rx called into requested  

## 2016-04-14 NOTE — Telephone Encounter (Signed)
Last f/u 11/2015-CPE 

## 2016-04-20 NOTE — Progress Notes (Signed)
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  This patient is accompanied in the office by her child who supplements the history.      She is on carbidopa/levodopa 25/100, 3 tablets 3 times per day.  She is doing well on this medication, without side effects.  She had one fall the day before Thanksgiving.  She was in a parking lot of a restaurant and tripped over something(curb).  She fx her nose and had to have her lip stitched up.    Since then, however, she has done very well.  Her daughter reports that she has been very stable on her feet.  Last visit, she was complaining of a decreased appetite and increased anxiety.  Therefore, I increased her Remeron to 30 mg at night.  This helped with both sleep and appetite and she is very pleased.  She also has a hx of seizure, likely secondary to Wellbutrin.  Last visit, her keppra was d/c.  She has been seizure free since that time.  However, mood and depression continue to be an issue.  I sent her to Dr. Cheryln Manly but she didn't follow up.  She states that she cannot talk to him without her daughter present.  Her biggest issue is that her granddaughter is a Ship broker and she lives with the patient.  Apparently, the majority of the house is clean and safe with the exception of the granddaughters room.  The patient states that the mess just drives her crazy and she cannot stop thinking about it.  Her granddaughter goes to community college and her leaving the house is not an option.  04/03/13 update:  [name].  She was supposed to f/u with Dr Cheryln Manly due to significant anxiety and depression and she cancelled her 3/4, 3/13 and 3/27 visits and never r/s.  She has not seen him since 10/13.  She states that she is just "not going to talk with someone."  She, however, is doing some better in that regard.  Much of her stress surrounds her granddaughter, who lives with  her and is a Ship broker.  The patient states that her granddaughter has now admitted to this, but to seek help.  They were able to get some of the room cleaned up a while ago and that seemed to really help the patient's mental state.  However, the room has reconnected and is now just as it previously was.  She also reports a significant amount of stress over her sister who has end-stage Alzheimer's dementia.  Her sister no longer recognizes her, and the patient realizes when she goes around her sister, it makes her symptoms much worse.  She feels that her memory is getting worse, but nothing like her sisters.  She also thinks that her speech is getting worse.    She is having some headaches, similar to what she had in the past, prior to starting on the Levodopa.  It is over the L forehead, but not in the eye.  It is pounding.  It is daily.  It does not cause n/v or cause photophobia or phonophobia.  It makes her feel "drained."  It does not prevent her from doing things.  There are no lateralizing paresthesias with the headaches.  There are no vision changes.  07/30/13 update:  The pt is on carbidopa/levodopa 25/100 three tablets three times per day.  She is doing well in that  regard.  She did not go to PT because of transportation issues.  She was started on zanaflex last visit for HA and was also started on the exelon patch.  Interestingly, she states that the exelon patch caused headaches but it was started after the headache.  Nonetheless, she stopped it and still has headache.  It starts in the L forehead and radiates over the L side of the head.  She still thinks that the patch made them worse however and also thinks that it caused UTI's.  She is still on zanaflex but isn't sure its helping.  No falls/hallucination/syncope/lightheadedness.  11/27/13 update:  Patient is following up today in the movement disorder clinic.  She is currently on carbidopa/levodopa 25/100, 3 tablets by mouth 3 times per day.  Since our  last visit, I did get a note from Dr. Domingo Cocking at the headache clinic.  He started the patient on Indocin 25 mg 3 times a day and she was told to titrate to 75 mg 3 times a day.  She states that she had SE with that and she was changed to gabapentin 100 mg and that is helping.  He also scheduled her for trigger point injections and an MRI brain.  She did not care for the trigger point injections although they did help.  She has f/u in June.  Walking seems to have been a little slower, but no falls.  Her daughter is with her today and feels that walking has been more tenuous.   No hallucinations.  No diplopia but is having blurry vision.  Does have vision appt in [facility] in few weeks.  05/07/14 update:   Patient is following up today in the movement disorder clinic.  She is currently on carbidopa/levodopa 25/100, 3 tablets by mouth 3 times per day.  Daughter states that she often forgets middle of the day dose but pt denies that.  She takes it at 9-10 am on awakening, 3 pm and bedtime.  She and her daughter both think that it is helpful.   No diplopia.  No falls.  She saw Dr. Domingo Cocking in June and I got a note from him.  She had more trigger point injections.  She is doing really well from a headache standpoint.  She doesn't have to return to him for 6 months (saw him in September).  She continues on remeron for her depression and mood.  She states that she hates that she is confined to the home and depression is tied into that.  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  She then asks if her daughter can come back to the room for the discussion and she did.   She did do therapy with [facility] and she thought that it went well.   10/08/14 update:   Patient is following up today in the movement disorder clinic. She is accompanied by her daughter who supplements the hx.  She is currently on carbidopa/levodopa 25/100, 3 tablets  by mouth 3 times per day.  C/o memory loss.  States that her daughter will tell her to go to the kitchen to get things and she will get there and not be able to remember.  Her daughter thinks that she jumps subjects.  Also c/o feet paresthesias.  Worries that DM has "returned" as used to be diabetic but not anymore.  Last A1C in April was 6.0.  Also  states that right leg below the knee posteriorally she is having paresthesias.  Started after she bent over one day and the knee "popped."  No LBP.  On gabapentin but that is for headache, which is well controlled.  She has been headache free for a long time.  Received a note from Dr. Domingo Cocking recently and pt told to continue meds.    04/10/15 update:  The patient is following up today.  She is on carbidopa/levodopa 25/100, 3 tablets 3 times per day.  Because I was not convinced that the patient had a dementia but her previous neuropsych test years ago said that she did and because some of the patient's depression surrounds the fact that her daughter will not let her drive because of this potential diagnosis, we repeated neuropsych testing.  While there was evidence of cognitive change due to depression, she did not meet the criteria for the diagnosis of dementia.  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  It was recommended that she undergo psychiatric evaluation and treatment, but she refused.  She was again given a brochure for the driving evaluator in [place] to have a driving evaluation.  She reports that her depression was fairly well under control until the end of 2022/12/14 when her sister died and she has been appropriately grieving.  She did have an EMG done since last visit.  This was done on 11/07/2014 There was no evidence of a large fiber peripheral neuropathy, but there was evidence of a chronic, moderate right L2-L4 radiculopathy and a chronic, mild L5 radiculopathy on the right.  She fell one time getting out  of the chair as the right leg gave out.  She also rolled out of the bed at night one time.   She states that she was dreaming.   Fortunately, she did not get hurt with either of these.    10/20/15 update:  The patient follows up today, accompanied by her daughter who supplements the history.  She has a history of vascular parkinsonism and is currently on carbidopa/levodopa 25/100, 3 tablets 3 times per day.  Reports that she is a little "fidgety" and that she just can't stay still.  No new medications.  She also has a history of depression and is prescribed Remeron by her primary care physician.  Her depression mostly surrounds the fact that she cannot leave the house because her daughter will not let her drive.  Depression is improved as she has gotten some cats that keep her company.  We have talked previously about a driving evaluation, as I think she likely could drive from a neurologic standpoint.  Her daughter does not want her to pursue that.  No falls.   She has been headache free on gabapentin and doesn't want to change that  04/22/16 update:  The patient follows up today, accompanied by her daughter supplements the history.  She is on carbidopa/levodopa 25/100, 3 tablets 3 times per day.  She has had 2 falls.  With the first, she got up and made her bed, which she doesn't even remember but she does remember hitting the ground.  With the second one, her toilet was leaking and she fell over the wet towels on the floor and she hit her L head.  She went to the ER for this one, which was 8/29.  She has had a sore spot on the L occiput since. Was given tramadol but made her too sleepy. She had  a CT brain that day and I reviewed it.  It was nonacute and only WMD and mild atrophy.   She denies lightheadedness or near syncope.  She is on Remeron for her depression and feels that she has been stable in that regard.  Reports that "I talk to my cats while my family is working."   She has had no hallucinations.  She  is not particularly active with cardiovascular exercise.  Headaches well controlled on gabapentin 100 mg tid except sore occiput where she fell.  She also uses that for lumbar radiculopathy but reports that is better.    Neuroimaging has previously been performed.   It was done on 09/13/2013 and I reviewed these films.  There was atrophy and moderate small vessel disease.  MRI brain,09/13/13  FINDINGS:  Advanced cerebral and cerebellar atrophy. Chronic microvascular  ischemic change affecting the periventricular and subcortical white  matter as well as the brainstem above moderate to advanced degree.  No acute stroke, acute hemorrhage, mass lesion, or extra-axial  fluid. Hydrocephalus ex vacuo. No features to suggest normal  pressure hydrocephalus.  Post infusion, no abnormal enhancement of the brain or meninges. No  acute or reversible cause of dementia is evident.  Pituitary, pineal, and cerebellar tonsils unremarkable. No upper  cervical lesions. Flow voids are maintained throughout the carotid,  basilar, and vertebral arteries. There are no areas of chronic  hemorrhage. Visualized calvarium, skull base, and upper cervical  osseous structures unremarkable. Scalp and extracranial soft  tissues, orbits, sinuses, and mastoids show no acute process.  Similar appearance to priors.  IMPRESSION:  Atrophy and small vessel disease. No acute intracranial findings.    PREVIOUS MEDICATIONS: Sinemet  ALLERGIES:   Allergies  Allergen Reactions  . Codeine Sulfate     REACTION: hallucinations  . Penicillins Anaphylaxis, Swelling and Rash  . Wellbutrin [Bupropion] Other (See Comments)    Seizures and hallucinations    CURRENT MEDICATIONS:  Current Outpatient Prescriptions on File Prior to Visit  Medication Sig Dispense Refill  . ALPRAZolam (XANAX) 0.5 MG tablet TAKE 1 TABLET BY MOUTH TWICE A DAY AS NEEDED 60 tablet 0  . aspirin 81 MG tablet Take 81 mg by mouth daily.     Marland Kitchen Bioflavonoid  Products (GRAPE SEED PO) Take by mouth daily.    . Calcium Carbonate-Vitamin D (CALCIUM + D PO) Take 600 mg by mouth daily.    . carbidopa-levodopa (SINEMET IR) 25-100 MG tablet TAKE 3 TABLETS BY MOUTH 3 TIMES A DAY 810 tablet 0  . cetirizine (ZYRTEC) 10 MG tablet Take 10 mg by mouth daily.    . Cholecalciferol (VITAMIN D3) 1000 UNITS CAPS Take 1 capsule by mouth daily.      . feeding supplement (ENSURE COMPLETE) LIQD Take 237 mLs by mouth 2 (two) times daily between meals. 1 Bottle 11  . FLUoxetine (PROZAC) 20 MG capsule TAKE ONE CAPSULE BY MOUTH EVERY DAY 90 capsule 1  . gabapentin (NEURONTIN) 100 MG capsule TAKE 1 CAPSULE (100 MG TOTAL) BY MOUTH 3 (THREE) TIMES DAILY. 270 capsule 1  . losartan-hydrochlorothiazide (HYZAAR) 50-12.5 MG tablet TAKE 1 TABLET BY MOUTH DAILY 90 tablet 3  . mirtazapine (REMERON) 30 MG tablet Take 1 tablet (30 mg total) by mouth at bedtime. 90 tablet 0  . Multiple Vitamins-Minerals (CENTRUM SILVER ADULT 50+) TABS Take by mouth.    . Ranitidine HCl ([facility] RANITIDINE PO) Reported on 08/26/2015    . simvastatin (ZOCOR) 20 MG tablet TAKE 1 TABLET (20 MG  TOTAL) BY MOUTH AT BEDTIME. 90 tablet 3  . traMADol (ULTRAM) 50 MG tablet Take 1 tablet (50 mg total) by mouth every 6 (six) hours as needed. 15 tablet 0  . vitamin B-12 (CYANOCOBALAMIN) 1000 MCG tablet Take 1,000 mcg by mouth daily.       No current facility-administered medications on file prior to visit.     PAST MEDICAL HISTORY:   Past Medical History:  Diagnosis Date  . Adjustment disorder with depressed mood   . Anxiety   . Bell's palsy    1985  . DEMENTIA   . Depressive disorder, not elsewhere classified   . 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 DVT (deep venous thrombosis), left "early 2000's"   RLE  . Dysmetabolic syndrome X   . Essential hypertension, benign   . [id number]) 05/2011   "pretty often since they put me on Parkinson's medicine"  . History of shingles     "as a teen and in my 83's"  . Mixed hyperlipidemia   . Morbid obesity ([place])   . Neurodegenerative gait disorder   . Parkinson disease [facility])    "they think that's what it is"  . Personal history of thrombophlebitis   . Syncope and collapse 12/06/11   "this am; I pass out fairly often"  . Type II or unspecified type diabetes mellitus without mention of complication, not stated as uncontrolled 12/06/11   "not anymore; had lap band OR"    PAST SURGICAL HISTORY:   Past Surgical History:  Procedure Laterality Date  . bladder tack  11/1995  . BREAST BIOPSY  05/20/2000   bilaterally  . BREAST CYST ASPIRATION  12/1998   bilaterally  . CARDIAC CATHETERIZATION  2004  . CATARACT EXTRACTION W/ INTRAOCULAR LENS  IMPLANT, BILATERAL    . FRACTURE SURGERY  02/09/2006   bilateral elbows  . FRACTURE SURGERY     right knee  . FRACTURE SURGERY     tib plateau  . KNEE ARTHROSCOPY  12/12/2002   right   . LAPAROSCOPIC GASTRIC BANDING  2008  . SEPTOPLASTY  08/1998   with antral window   . TONSILLECTOMY AND ADENOIDECTOMY  1970  . TUBAL LIGATION  1970's    SOCIAL HISTORY:   Social History   Social History  . Marital status: Widowed    Spouse name: N/A  . Number of children: 2  . Years of education: N/A   Occupational History  . 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 Smoking status: Never Smoker  . Smokeless tobacco: Never Used  . Alcohol use No     Comment: none  . Drug use: No  . Sexual activity: No   Other Topics Concern  . Not on file   Social History Narrative   Lives with daughter and granddaughter.   Daughter is HPOA.   DNR.    FAMILY HISTORY:   Family Status  Relation Status  . Mother Deceased at age 82   natural causes  . Father Deceased at age 72   MI  . Sister Alive   DM-2  . Child Alive   2, HTN, DM-2, hyperlipidemia    ROS:  A complete 10 system review of systems was obtained and was unremarkable apart from what  is mentioned above.  PHYSICAL EXAMINATION:    VITALS:   Vitals:   04/22/16 0801  BP: 128/70  Pulse:  76  Weight: 159 lb (72.1 kg)  Height: 5\' 1"  (1.549 m)     GEN:  The patient appears stated age and is in NAD. HEENT:  Normocephalic, atraumatic.  There is a slight sore bump on the L occiput.  The mucous membranes are moist. The superficial temporal arteries are without ropiness or tenderness. CV:  RRR Lungs:  CTAB Neck/HEME:  There are no carotid bruits bilaterally.  Neurological examination:  Orientation: The patient was alert and oriented x 3.  MocA 04/11/2012 was 25/30, 04/03/13 was 20/30, and 05/07/14 and was 26/30 Cranial nerves: There is L facial droop with decreased forehead wrinkle on the L.  She has a positive stellwags sign. Pupils are equal round and reactive to light bilaterally. Fundoscopic exam reveals clear margins bilaterally. Extraocular muscles are intact.  There are square wave jerks and difficulty with smooth pursuit.  The speech is hypophonic but fluent and clear. She has no trouble repeating gutteral sounds.  Soft palate rises symmetrically and there is no tongue deviation. Hearing is intact to conversational tone. Sensation: Sensation is intact to light touch throughout.   Motor: Strength is 5/5 in the bilateral upper and lower extremities.   Shoulder shrug is equal and symmetric.  There is no pronator drift.  Movement examination: Tone: There is normal tone in the bilateral upper extremities today. The tone in the lower extremities is normal.  Abnormal movements:  No tremor noted.   Coordination:  There is decremation with RAM's, only with heel taps on the L.   Gait and Station: The patient is short stepped and mildly unstable.  She has some difficulty with turns.  LABS:  Lab Results  Component Value Date   WBC 9.5 03/23/2016   HGB 13.2 03/23/2016   HCT 41.4 03/23/2016   MCV 97.6 03/23/2016   PLT 215 03/23/2016     Chemistry      Component Value  Date/Time   NA 141 03/23/2016 1342   K 4.6 03/23/2016 1342   CL 103 03/23/2016 1342   CO2 31 03/23/2016 1342   BUN 17 03/23/2016 1342   CREATININE 0.62 03/23/2016 1342      Component Value Date/Time   CALCIUM 10.0 03/23/2016 1342   ALKPHOS 63 12/01/2015 0817   AST 15 12/01/2015 0817   ALT 8 12/01/2015 0817   BILITOT 0.2 12/01/2015 0817     Lab Results  Component Value Date   TSH 1.82 12/01/2015   Lab Results  Component Value Date   VITAMINB12 1,387 (H) 11/16/2012    Lab Results  Component Value Date   HGBA1C 6.2 12/01/2015   Lab Results  Component Value Date   ESRSEDRATE 18 04/03/2013    ASSESSMENT/PLAN: 1.  Vascular Parkinsonism.  Her primary issue is gait instability with slowness of movement.  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  However, both the patient and her daughter think that levodopa has been beneficial and they think it continues to be beneficial, as I actually considered tapering it.  Neither of them wanted that. symptoms got worse when I tried this in the past.    -she will remain on carbidopa/levodopa 25/100, 3 tablets 3 times per day.  Risks, benefits, side effects and alternative therapies were discussed.  The opportunity to ask questions was given and they were answered to the best of my ability.  The patient expressed understanding and willingness to follow the outlined treatment protocols.  -Safety discussed.  Pt education provided.  -told her that sore spot on L occiput will likely take 4-6 weeks to get better.  Started after fell and hit it.  CT head neg and I reviewed this.  Told her to ice it.   2.  Hx of seizure.  This is likely secondary to the initiation of wellbutrin.  She should remain off of this medication.  EEG in the hospital was normal.  She has been seizure-free for several years. 3.  Depression.   -on remeron.   Managed by PCP.  - I think her depression is situational and related to not being able to drive.  They don't want to pursue driving eval.  Pt/daughter states that growing a garden this summer helped.      -No SI/HI  -I encouraged her to exercise safely.   4.  Memory change  -Because I was not convinced that the patient had a dementia but her previous neuropsych test years ago said that she did and because some of the patient's depression surrounds the fact that her daughter will not let her drive because of this potential diagnosis, we repeated neuropsych testing in 10/2014.  While there was evidence of cognitive change due to depression, she did not meet the criteria for the diagnosis of dementia. I told her again, that I was not convinced of dementia and neuropsych didn't support this right now.  Pt is not driving.  Dr. Conley Canal and I both gave her information on a medical driving evaluation but her daughter has asked the patient not to pursue. 5.  Chronic lumbar radiculopathy  -She is doing better in this regard  -continue gabapentin to 100 tid.  Helping headache as well so no longer seeing Dr. Domingo Cocking 6.  F/u 4-5 months, sooner should new issues arise.  Much greater than 50% of this visit was spent in counseling with the patient and the family.  Total face to face time:  25 min

## 2016-04-22 ENCOUNTER — Encounter: Payer: Self-pay | Admitting: Neurology

## 2016-04-22 ENCOUNTER — Encounter: Payer: Self-pay | Admitting: Family Medicine

## 2016-04-22 ENCOUNTER — Ambulatory Visit (INDEPENDENT_AMBULATORY_CARE_PROVIDER_SITE_OTHER): Payer: Medicare Other | Admitting: Neurology

## 2016-04-22 ENCOUNTER — Ambulatory Visit (INDEPENDENT_AMBULATORY_CARE_PROVIDER_SITE_OTHER): Payer: Medicare Other | Admitting: Family Medicine

## 2016-04-22 VITALS — BP 128/70 | HR 76 | Ht 61.0 in | Wt 159.0 lb

## 2016-04-22 VITALS — BP 122/70 | HR 70 | Temp 98.1°F | Wt 159.8 lb

## 2016-04-22 DIAGNOSIS — G214 Vascular parkinsonism: Secondary | ICD-10-CM

## 2016-04-22 DIAGNOSIS — N390 Urinary tract infection, site not specified: Secondary | ICD-10-CM

## 2016-04-22 DIAGNOSIS — F330 Major depressive disorder, recurrent, mild: Secondary | ICD-10-CM

## 2016-04-22 DIAGNOSIS — Z23 Encounter for immunization: Secondary | ICD-10-CM | POA: Diagnosis not present

## 2016-04-22 LAB — POC URINALSYSI DIPSTICK (AUTOMATED)
Bilirubin, UA: NEGATIVE
Blood, UA: NEGATIVE
Ketones, UA: NEGATIVE
Leukocytes, UA: NEGATIVE
Nitrite, UA: NEGATIVE
Protein, UA: NEGATIVE
Spec Grav, UA: 1.025
Urobilinogen, UA: 0.2
pH, UA: 7

## 2016-04-22 NOTE — Progress Notes (Signed)
SUBJECTIVE: [name] is a 70 y.o. female who complains of urinary frequency and strong odor to her urine for x 12 days, without flank pain, fever, chills, or abnormal vaginal discharge or bleeding.   Does have a h/o recurrent UTIs.  Last urine cxs- 10/22/15, 01/17/15- no growth Urine cx 03/07/14- >100,000 - ? Contaminant, no dominant bacteria    Current Outpatient Prescriptions on File Prior to Visit  Medication Sig Dispense Refill  . ALPRAZolam (XANAX) 0.5 MG tablet TAKE 1 TABLET BY MOUTH TWICE A DAY AS NEEDED 60 tablet 0  . aspirin 81 MG tablet Take 81 mg by mouth daily.     Marland Kitchen Bioflavonoid Products (GRAPE SEED PO) Take by mouth daily.    . Calcium Carbonate-Vitamin D (CALCIUM + D PO) Take 600 mg by mouth daily.    . carbidopa-levodopa (SINEMET IR) 25-100 MG tablet TAKE 3 TABLETS BY MOUTH 3 TIMES A DAY 810 tablet 0  . cetirizine (ZYRTEC) 10 MG tablet Take 10 mg by mouth daily.    . Cholecalciferol (VITAMIN D3) 1000 UNITS CAPS Take 1 capsule by mouth daily.      . feeding supplement (ENSURE COMPLETE) LIQD Take 237 mLs by mouth 2 (two) times daily between meals. 1 Bottle 11  . FLUoxetine (PROZAC) 20 MG capsule TAKE ONE CAPSULE BY MOUTH EVERY DAY 90 capsule 1  . gabapentin (NEURONTIN) 100 MG capsule TAKE 1 CAPSULE (100 MG TOTAL) BY MOUTH 3 (THREE) TIMES DAILY. 270 capsule 1  . losartan-hydrochlorothiazide (HYZAAR) 50-12.5 MG tablet TAKE 1 TABLET BY MOUTH DAILY 90 tablet 3  . mirtazapine (REMERON) 30 MG tablet Take 1 tablet (30 mg total) by mouth at bedtime. 90 tablet 0  . Multiple Vitamins-Minerals (CENTRUM SILVER ADULT 50+) TABS Take by mouth.    . Ranitidine HCl ([facility] RANITIDINE PO) Reported on 08/26/2015    . simvastatin (ZOCOR) 20 MG tablet TAKE 1 TABLET (20 MG TOTAL) BY MOUTH AT BEDTIME. 90 tablet 3  . traMADol (ULTRAM) 50 MG tablet Take 1 tablet (50 mg total) by mouth every 6 (six) hours as needed. 15 tablet 0  . vitamin B-12 (CYANOCOBALAMIN) 1000 MCG tablet Take 1,000 mcg by mouth  daily.       No current facility-administered medications on file prior to visit.     Allergies  Allergen Reactions  . Codeine Sulfate     REACTION: hallucinations  . Penicillins Anaphylaxis, Swelling and Rash  . Wellbutrin [Bupropion] Other (See Comments)    Seizures and hallucinations    Past Medical History:  Diagnosis Date  . Adjustment disorder with depressed mood   . Anxiety   . Bell's palsy    1985  . DEMENTIA   . Depressive disorder, not elsewhere classified   . 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 DVT (deep venous thrombosis), left "early 2000's"   RLE  . Dysmetabolic syndrome X   . Essential hypertension, benign   . [id number]) 05/2011   "pretty often since they put me on Parkinson's medicine"  . History of shingles    "as a teen and in my 31's"  . Mixed hyperlipidemia   . Morbid obesity ([place])   . Neurodegenerative gait disorder   . Parkinson disease [facility])    "they think that's what it is"  . Personal history of thrombophlebitis   . Syncope and collapse 12/06/11   "this am; I pass out fairly often"  . Type II or unspecified type diabetes  mellitus without mention of complication, not stated as uncontrolled 12/06/11   "not anymore; had lap band OR"    Past Surgical History:  Procedure Laterality Date  . bladder tack  11/1995  . BREAST BIOPSY  05/20/2000   bilaterally  . BREAST CYST ASPIRATION  12/1998   bilaterally  . CARDIAC CATHETERIZATION  2004  . CATARACT EXTRACTION W/ INTRAOCULAR LENS  IMPLANT, BILATERAL    . FRACTURE SURGERY  02/09/2006   bilateral elbows  . FRACTURE SURGERY     right knee  . FRACTURE SURGERY     tib plateau  . KNEE ARTHROSCOPY  12/12/2002   right   . LAPAROSCOPIC GASTRIC BANDING  2008  . SEPTOPLASTY  08/1998   with antral window   . TONSILLECTOMY AND ADENOIDECTOMY  1970  . TUBAL LIGATION  1970's    Family History  Problem Relation Age of Onset  . Asthma Mother   . COPD Father   .  Heart disease Father   . Diabetes Sister   . Vision loss Sister     legally blind    Social History   Social History  . Marital status: Widowed    Spouse name: N/A  . Number of children: 2  . Years of education: N/A   Occupational History  . 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 Smoking status: Never Smoker  . Smokeless tobacco: Never Used  . Alcohol use No     Comment: none  . Drug use: No  . Sexual activity: No   Other Topics Concern  . Not on file   Social History Narrative   Lives with daughter and granddaughter.   Daughter is HPOA.   DNR.   The PMH, PSH, Social History, Family History, Medications, and allergies have been reviewed in [facility], and have been updated if relevant.  OBJECTIVE:  BP 122/70   Pulse 70   Temp 98.1 F (36.7 C) (Oral)   Wt 159 lb 12 oz (72.5 kg)   SpO2 97%   BMI 30.18 kg/m   Appears well, in no apparent distress.  Vital signs are normal. The abdomen is soft without tenderness, guarding, mass, rebound or organomegaly. No CVA tenderness or inguinal adenopathy noted. Urine dipstick shows:mod LE    ASSESSMENT: UA neg- bladder spasms  PLAN:  Discussed avoiding bladder irritants, pushing fluids. Call or return to clinic prn if these symptoms worsen or fail to improve as anticipated. The patient indicates understanding of these issues and agrees with the plan.

## 2016-05-14 ENCOUNTER — Other Ambulatory Visit: Payer: Self-pay | Admitting: Family Medicine

## 2016-05-14 NOTE — Telephone Encounter (Signed)
Last f/u 01/2016 

## 2016-05-14 NOTE — Telephone Encounter (Signed)
Rx called in to requested pharmacy 

## 2016-05-21 ENCOUNTER — Other Ambulatory Visit: Payer: Self-pay | Admitting: Family Medicine

## 2016-05-31 DIAGNOSIS — J309 Allergic rhinitis, unspecified: Secondary | ICD-10-CM | POA: Diagnosis not present

## 2016-05-31 DIAGNOSIS — H1045 Other chronic allergic conjunctivitis: Secondary | ICD-10-CM | POA: Diagnosis not present

## 2016-06-11 ENCOUNTER — Observation Stay (HOSPITAL_COMMUNITY)
Admission: EM | Admit: 2016-06-11 | Discharge: 2016-06-14 | Disposition: A | Discharge status: Home / Self Care | Payer: Medicare Other | Attending: Internal Medicine | Admitting: Internal Medicine

## 2016-06-11 ENCOUNTER — Emergency Department (HOSPITAL_COMMUNITY): Payer: Medicare Other

## 2016-06-11 ENCOUNTER — Encounter (HOSPITAL_COMMUNITY): Payer: Self-pay | Admitting: Emergency Medicine

## 2016-06-11 DIAGNOSIS — F329 Major depressive disorder, single episode, unspecified: Secondary | ICD-10-CM | POA: Diagnosis not present

## 2016-06-11 DIAGNOSIS — E782 Mixed hyperlipidemia: Secondary | ICD-10-CM | POA: Diagnosis not present

## 2016-06-11 DIAGNOSIS — Z86718 Personal history of other venous thrombosis and embolism: Secondary | ICD-10-CM | POA: Insufficient documentation

## 2016-06-11 DIAGNOSIS — K219 Gastro-esophageal reflux disease without esophagitis: Secondary | ICD-10-CM | POA: Diagnosis not present

## 2016-06-11 DIAGNOSIS — R079 Chest pain, unspecified: Secondary | ICD-10-CM | POA: Diagnosis present

## 2016-06-11 DIAGNOSIS — G20 Parkinson's disease: Secondary | ICD-10-CM | POA: Insufficient documentation

## 2016-06-11 DIAGNOSIS — I2510 Atherosclerotic heart disease of native coronary artery without angina pectoris: Secondary | ICD-10-CM | POA: Diagnosis not present

## 2016-06-11 DIAGNOSIS — Z66 Do not resuscitate: Secondary | ICD-10-CM | POA: Diagnosis not present

## 2016-06-11 DIAGNOSIS — R438 Other disturbances of smell and taste: Secondary | ICD-10-CM | POA: Diagnosis not present

## 2016-06-11 DIAGNOSIS — I10 Essential (primary) hypertension: Secondary | ICD-10-CM | POA: Insufficient documentation

## 2016-06-11 DIAGNOSIS — Z7982 Long term (current) use of aspirin: Secondary | ICD-10-CM | POA: Diagnosis not present

## 2016-06-11 DIAGNOSIS — E119 Type 2 diabetes mellitus without complications: Secondary | ICD-10-CM | POA: Diagnosis not present

## 2016-06-11 DIAGNOSIS — M79602 Pain in left arm: Secondary | ICD-10-CM | POA: Diagnosis not present

## 2016-06-11 DIAGNOSIS — Z79899 Other long term (current) drug therapy: Secondary | ICD-10-CM | POA: Insufficient documentation

## 2016-06-11 DIAGNOSIS — Z87440 Personal history of urinary (tract) infections: Secondary | ICD-10-CM | POA: Diagnosis present

## 2016-06-11 DIAGNOSIS — F341 Dysthymic disorder: Secondary | ICD-10-CM | POA: Diagnosis present

## 2016-06-11 DIAGNOSIS — E1165 Type 2 diabetes mellitus with hyperglycemia: Secondary | ICD-10-CM

## 2016-06-11 DIAGNOSIS — G510 Bell's palsy: Secondary | ICD-10-CM | POA: Insufficient documentation

## 2016-06-11 DIAGNOSIS — Z9884 Bariatric surgery status: Secondary | ICD-10-CM | POA: Diagnosis not present

## 2016-06-11 DIAGNOSIS — R072 Precordial pain: Principal | ICD-10-CM | POA: Insufficient documentation

## 2016-06-11 DIAGNOSIS — E8881 Metabolic syndrome: Secondary | ICD-10-CM | POA: Diagnosis not present

## 2016-06-11 DIAGNOSIS — F419 Anxiety disorder, unspecified: Secondary | ICD-10-CM | POA: Insufficient documentation

## 2016-06-11 DIAGNOSIS — Z6829 Body mass index (BMI) 29.0-29.9, adult: Secondary | ICD-10-CM | POA: Diagnosis not present

## 2016-06-11 DIAGNOSIS — Z8249 Family history of ischemic heart disease and other diseases of the circulatory system: Secondary | ICD-10-CM | POA: Insufficient documentation

## 2016-06-11 DIAGNOSIS — R430 Anosmia: Secondary | ICD-10-CM | POA: Diagnosis present

## 2016-06-11 DIAGNOSIS — K5790 Diverticulosis of intestine, part unspecified, without perforation or abscess without bleeding: Secondary | ICD-10-CM | POA: Insufficient documentation

## 2016-06-11 DIAGNOSIS — R481 Agnosia: Secondary | ICD-10-CM | POA: Diagnosis present

## 2016-06-11 LAB — CBC WITH DIFFERENTIAL/PLATELET
Basophils Absolute: 0.1 10*3/uL (ref 0.0–0.1)
Basophils Relative: 1 %
Eosinophils Absolute: 0.3 10*3/uL (ref 0.0–0.7)
Eosinophils Relative: 4 %
HCT: 39.8 % (ref 36.0–46.0)
Hemoglobin: 13.1 g/dL (ref 12.0–15.0)
Lymphocytes Relative: 30 %
Lymphs Abs: 2.5 10*3/uL (ref 0.7–4.0)
MCH: 31.6 pg (ref 26.0–34.0)
MCHC: 32.9 g/dL (ref 30.0–36.0)
MCV: 96.1 fL (ref 78.0–100.0)
Monocytes Absolute: 0.6 10*3/uL (ref 0.1–1.0)
Monocytes Relative: 7 %
Neutro Abs: 4.8 10*3/uL (ref 1.7–7.7)
Neutrophils Relative %: 58 %
Platelets: 218 10*3/uL (ref 150–400)
RBC: 4.14 MIL/uL (ref 3.87–5.11)
RDW: 13.2 % (ref 11.5–15.5)
WBC: 8.2 10*3/uL (ref 4.0–10.5)

## 2016-06-11 LAB — BASIC METABOLIC PANEL
Anion gap: 9 (ref 5–15)
BUN: 20 mg/dL (ref 6–20)
CO2: 30 mmol/L (ref 22–32)
Calcium: 10.1 mg/dL (ref 8.9–10.3)
Chloride: 101 mmol/L (ref 101–111)
Creatinine, Ser: 0.77 mg/dL (ref 0.44–1.00)
GFR calc Af Amer: >60 mL/min (ref >60)
GFR calc non Af Amer: >60 mL/min (ref >60)
Glucose, Bld: 120 mg/dL — ABNORMAL HIGH (ref 65–99)
Potassium: 3.9 mmol/L (ref 3.5–5.1)
Sodium: 140 mmol/L (ref 135–145)

## 2016-06-11 LAB — TROPONIN I
Troponin I: <0.03 ng/mL (ref <0.03)
Troponin I: <0.03 ng/mL (ref <0.03)
Troponin I: <0.03 ng/mL (ref <0.03)
Troponin I: <0.03 ng/mL (ref <0.03)

## 2016-06-11 LAB — LIPID PANEL
Cholesterol: 170 mg/dL (ref 0–200)
HDL: 58 mg/dL (ref >40)
LDL Cholesterol: 78 mg/dL (ref 0–99)
Total CHOL/HDL Ratio: 2.9 RATIO
Triglycerides: 171 mg/dL — ABNORMAL HIGH (ref <150)
VLDL: 34 mg/dL (ref 0–40)

## 2016-06-11 LAB — GLUCOSE, CAPILLARY: Glucose-Capillary: 112 mg/dL — ABNORMAL HIGH (ref 65–99)

## 2016-06-11 MED ORDER — LOSARTAN POTASSIUM-HCTZ 50-12.5 MG PO TABS: 1.0000 | ORAL_TABLET | Freq: Every day | ORAL | Status: DC

## 2016-06-11 MED ORDER — GI COCKTAIL ~~LOC~~
30.0000 mL | Freq: Once | ORAL | Status: AC
  Administered 2016-06-11: 30 mL via ORAL
  Filled 2016-06-11: qty 30

## 2016-06-11 MED ORDER — ENOXAPARIN SODIUM 40 MG/0.4ML ~~LOC~~ SOLN
40.0000 mg | SUBCUTANEOUS | Status: DC
  Administered 2016-06-12 – 2016-06-14 (×3): 40 mg via SUBCUTANEOUS
  Filled 2016-06-11 (×3): qty 0.4

## 2016-06-11 MED ORDER — SIMVASTATIN 20 MG PO TABS
20.0000 mg | ORAL_TABLET | Freq: Every day | ORAL | Status: DC
  Administered 2016-06-11 – 2016-06-13 (×3): 20 mg via ORAL
  Filled 2016-06-11 (×3): qty 1

## 2016-06-11 MED ORDER — GABAPENTIN 100 MG PO CAPS
100.0000 mg | ORAL_CAPSULE | Freq: Three times a day (TID) | ORAL | Status: DC
  Administered 2016-06-11 – 2016-06-14 (×9): 100 mg via ORAL
  Filled 2016-06-11 (×9): qty 1

## 2016-06-11 MED ORDER — HYDROCHLOROTHIAZIDE 12.5 MG PO CAPS
12.5000 mg | ORAL_CAPSULE | Freq: Every day | ORAL | Status: DC
  Administered 2016-06-12 – 2016-06-14 (×3): 12.5 mg via ORAL
  Filled 2016-06-11 (×3): qty 1

## 2016-06-11 MED ORDER — ASPIRIN EC 81 MG PO TBEC
81.0000 mg | DELAYED_RELEASE_TABLET | Freq: Every day | ORAL | Status: DC
  Administered 2016-06-12 – 2016-06-14 (×3): 81 mg via ORAL
  Filled 2016-06-11 (×3): qty 1

## 2016-06-11 MED ORDER — ALPRAZOLAM 0.5 MG PO TABS
0.5000 mg | ORAL_TABLET | Freq: Two times a day (BID) | ORAL | Status: DC
  Administered 2016-06-11 – 2016-06-14 (×7): 0.5 mg via ORAL
  Filled 2016-06-11: qty 1
  Filled 2016-06-11: qty 2
  Filled 2016-06-11 (×5): qty 1

## 2016-06-11 MED ORDER — ACETAMINOPHEN 325 MG PO TABS: 650.0000 mg | ORAL_TABLET | ORAL | Status: DC | PRN

## 2016-06-11 MED ORDER — LOSARTAN POTASSIUM 50 MG PO TABS
50.0000 mg | ORAL_TABLET | Freq: Every day | ORAL | Status: DC
  Administered 2016-06-12 – 2016-06-14 (×3): 50 mg via ORAL
  Filled 2016-06-11 (×3): qty 1

## 2016-06-11 MED ORDER — GI COCKTAIL ~~LOC~~: 30.0000 mL | Freq: Four times a day (QID) | ORAL | Status: DC | PRN

## 2016-06-11 MED ORDER — CARBIDOPA-LEVODOPA 25-100 MG PO TABS
3.0000 | ORAL_TABLET | Freq: Three times a day (TID) | ORAL | Status: DC
  Administered 2016-06-11 – 2016-06-14 (×9): 3 via ORAL
  Filled 2016-06-11 (×10): qty 3

## 2016-06-11 MED ORDER — TRAMADOL HCL 50 MG PO TABS: 50.0000 mg | ORAL_TABLET | Freq: Four times a day (QID) | ORAL | Status: DC | PRN

## 2016-06-11 MED ORDER — FLUOXETINE HCL 20 MG PO CAPS
20.0000 mg | ORAL_CAPSULE | Freq: Every day | ORAL | Status: DC
  Administered 2016-06-11 – 2016-06-14 (×4): 20 mg via ORAL
  Filled 2016-06-11 (×4): qty 1

## 2016-06-11 MED ORDER — MIRTAZAPINE 30 MG PO TABS
30.0000 mg | ORAL_TABLET | Freq: Every day | ORAL | Status: DC
  Administered 2016-06-11 – 2016-06-13 (×3): 30 mg via ORAL
  Filled 2016-06-11 (×3): qty 1

## 2016-06-11 MED ORDER — ONDANSETRON HCL 4 MG/2ML IJ SOLN: 4.0000 mg | Freq: Four times a day (QID) | INTRAMUSCULAR | Status: DC | PRN

## 2016-06-11 NOTE — ED Notes (Signed)
Ordered diet tray 

## 2016-06-11 NOTE — ED Provider Notes (Signed)
[facility] DEPT Provider Note   CSN: [id number] Arrival date & time: 06/11/16  0547     History   Chief Complaint Chief Complaint  Patient presents with  . Chest Pain    HPI [name] is a 70 y.o. female.  The history is provided by the patient and a relative.  Chest Pain   This is a new problem. The current episode started 1 to 2 hours ago. The problem occurs constantly. The problem has been rapidly improving. Associated with: started while lying in bed and woke her up at 0400. The pain is present in the substernal region. The pain is at a severity of 8/10. The pain is severe. The quality of the pain is described as sharp and heavy. The pain radiates to the left shoulder and left arm. Duration of episode(s) is 2 hours. Exacerbated by: unsure if anything makes it worse. Pertinent negatives include no abdominal pain, no back pain, no cough, no dizziness, no fever, no irregular heartbeat, no leg pain, no lower extremity edema, no nausea, no palpitations, no shortness of breath, no sputum production, no syncope and no vomiting. Associated symptoms comments: Pt states over the last 2 days she has had 3-4 episodes of diarrhea but denies fever, new abd pain, n/v.  No recent cough or congestion.. She has tried nitroglycerin (asa) for the symptoms. The treatment provided moderate relief. Risk factors include being elderly and obesity.  Her past medical history is significant for hyperlipidemia and hypertension. Past medical history comments: parkinson's, DM, DVT, syncope  Procedure history is positive for cardiac catheterization. Past workup comments: cardiac cath in 2004 with 25% stenosis of LAD and rest of arteries clear.    Past Medical History:  Diagnosis Date  . Adjustment disorder with depressed mood   . Anxiety   . Bell's palsy    1985  . DEMENTIA   . Depressive disorder, not elsewhere classified   . 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  DVT (deep venous thrombosis), left "early 2000's"   RLE  . Dysmetabolic syndrome X   . Essential hypertension, benign   . [id number]) 05/2011   "pretty often since they put me on Parkinson's medicine"  . History of shingles    "as a teen and in my 78's"  . Mixed hyperlipidemia   . Morbid obesity ([place])   . Neurodegenerative gait disorder   . Parkinson disease [facility])    "they think that's what it is"  . Personal history of thrombophlebitis   . Syncope and collapse 12/06/11   "this am; I pass out fairly often"  . Type II or unspecified type diabetes mellitus without mention of complication, not stated as uncontrolled 12/06/11   "not anymore; had lap band OR"    Patient Active Problem List   Diagnosis Date Noted  . GERD (gastroesophageal reflux disease) 08/26/2015  . Recurrent UTI 05/27/2015  . Diabetes mellitus type 2, diet-controlled ([place]) 12/03/2014  . History of recurrent UTIs 06/13/2014  . DNR (do not resuscitate) 11/23/2012  . Seizures ([name]) 04/03/2012  . Dysphagia 12/07/2011  . Orthostasis 12/07/2011  . Bell's palsy   . Loss of perception for taste 04/07/2011  . Loss, sense of, smell 04/07/2011  . Hypersomnia 01/18/2011  . OSTEOPENIA 05/21/2010  . ANXIETY DEPRESSION 04/16/2010  . OBESITY, MORBID 02/16/2007  . HYPERLIPIDEMIA, MIXED 01/31/2007  . HYPERTENSION, BENIGN ESSENTIAL 01/31/2007    Past Surgical History:  Procedure Laterality Date  .  bladder tack  11/1995  . BREAST BIOPSY  05/20/2000   bilaterally  . BREAST CYST ASPIRATION  12/1998   bilaterally  . CARDIAC CATHETERIZATION  2004  . CATARACT EXTRACTION W/ INTRAOCULAR LENS  IMPLANT, BILATERAL    . FRACTURE SURGERY  02/09/2006   bilateral elbows  . FRACTURE SURGERY     right knee  . FRACTURE SURGERY     tib plateau  . KNEE ARTHROSCOPY  12/12/2002   right   . LAPAROSCOPIC GASTRIC BANDING  2008  . SEPTOPLASTY  08/1998   with antral window   . TONSILLECTOMY AND ADENOIDECTOMY  1970  . TUBAL LIGATION   1970's    OB History    No data available       Home Medications    Prior to Admission medications   Medication Sig Start Date End Date Taking? Authorizing Provider  ALPRAZolam Duanne Moron) 0.5 MG tablet TAKE 1 TABLET BY MOUTH TWICE A DAY 05/14/16  Yes Lucille Passy, MD  aspirin 81 MG tablet Take 81 mg by mouth daily.    Yes Historical Provider, MD  Bioflavonoid Products (GRAPE SEED PO) Take 1 tablet by mouth daily.    Yes Historical Provider, MD  Calcium Carbonate-Vitamin D (CALCIUM + D PO) Take 600 mg by mouth daily.   Yes Historical Provider, MD  carbidopa-levodopa (SINEMET IR) 25-100 MG tablet TAKE 3 TABLETS BY MOUTH 3 TIMES A DAY 04/13/16  Yes Rebecca S Tat, DO  Cholecalciferol (VITAMIN D3) 1000 UNITS CAPS Take 1 capsule by mouth daily.     Yes Historical Provider, MD  feeding supplement (ENSURE COMPLETE) LIQD Take 237 mLs by mouth 2 (two) times daily between meals. 04/01/12  Yes Robbie Lis, MD  fexofenadine (ALLEGRA) 180 MG tablet Take 180 mg by mouth daily.   Yes Historical Provider, MD  FLUoxetine (PROZAC) 20 MG capsule TAKE ONE CAPSULE BY MOUTH EVERY DAY 12/30/15  Yes Lucille Passy, MD  gabapentin (NEURONTIN) 100 MG capsule TAKE 1 CAPSULE (100 MG TOTAL) BY MOUTH 3 (THREE) TIMES DAILY. 10/01/15  Yes Rebecca S Tat, DO  losartan-hydrochlorothiazide (HYZAAR) 50-12.5 MG tablet TAKE 1 TABLET BY MOUTH DAILY 03/02/16  Yes Lucille Passy, MD  mirtazapine (REMERON) 30 MG tablet Take 1 tablet (30 mg total) by mouth at bedtime. 04/13/16  Yes Rebecca S Tat, DO  Multiple Vitamins-Minerals (CENTRUM SILVER ADULT 50+) TABS Take by mouth.   Yes Historical Provider, MD  ranitidine (ZANTAC) 150 MG tablet Take 150 mg by mouth daily as needed for heartburn.   Yes Historical Provider, MD  simvastatin (ZOCOR) 20 MG tablet TAKE 1 TABLET (20 MG TOTAL) BY MOUTH AT BEDTIME. 05/21/16  Yes Lucille Passy, MD  traMADol (ULTRAM) 50 MG tablet Take 1 tablet (50 mg total) by mouth every 6 (six) hours as needed. 03/23/16  Yes Larene Pickett, PA-C  vitamin B-12 (CYANOCOBALAMIN) 1000 MCG tablet Take 1,000 mcg by mouth daily.     Yes Historical Provider, MD    Family History Family History  Problem Relation Age of Onset  . Asthma Mother   . COPD Father   . Heart disease Father   . Diabetes Sister   . Vision loss Sister     legally blind    Social History Social History  Substance Use Topics  . Smoking status: Never Smoker  . Smokeless tobacco: Never Used  . Alcohol use No     Comment: none     Allergies   Codeine sulfate;  Penicillins; and Wellbutrin [bupropion]   Review of Systems Review of Systems  Constitutional: Negative for fever.  Respiratory: Negative for cough, sputum production and shortness of breath.   Cardiovascular: Positive for chest pain. Negative for palpitations and syncope.  Gastrointestinal: Negative for abdominal pain, nausea and vomiting.       Has had LUQ abd pain for some time and doctors can't figure out what it is coming from.  No change today  Musculoskeletal: Negative for back pain.  Neurological: Negative for dizziness.  All other systems reviewed and are negative.    Physical Exam Updated Vital Signs BP 120/72   Pulse 64   Temp 99.9 F (37.7 C) (Oral)   Resp 15   Ht 5\' 1"  (1.549 m)   Wt 160 lb (72.6 kg)   SpO2 100%   BMI 30.23 kg/m   Physical Exam  Constitutional: She is oriented to person, place, and time. She appears well-developed and well-nourished. No distress.  HENT:  Head: Normocephalic and atraumatic.  Mouth/Throat: Oropharynx is clear and moist.  Eyes: Conjunctivae and EOM are normal. Pupils are equal, round, and reactive to light.  Neck: Normal range of motion. Neck supple.  Cardiovascular: Normal rate, regular rhythm and intact distal pulses.   No murmur heard. Pulmonary/Chest: Effort normal and breath sounds normal. No respiratory distress. She has no wheezes. She has no rales.  Abdominal: Soft. She exhibits no distension. There is tenderness  in the left upper quadrant. There is no rebound and no guarding.  Minimal LUQ tenderness to palpation  Musculoskeletal: Normal range of motion. She exhibits no edema or tenderness.  No calf tenderness or swelling  Neurological: She is alert and oriented to person, place, and time.  Skin: Skin is warm and dry. No rash noted. No erythema.  Psychiatric: She has a normal mood and affect. Her behavior is normal.  Nursing note and vitals reviewed.    ED Treatments / Results  Labs (all labs ordered are listed, but only abnormal results are displayed) Labs Reviewed  BASIC METABOLIC PANEL - Abnormal; Notable for the following:       Result Value   Glucose, Bld 120 (*)    All other components within normal limits  CBC WITH DIFFERENTIAL/PLATELET  TROPONIN I    EKG  EKG Interpretation  Date/Time:  Friday June 11 2016 05:59:11 EST Ventricular Rate:  65 PR Interval:    QRS Duration: 92 QT Interval:  427 QTC Calculation: 444 R Axis:   -68 Text Interpretation:  Sinus rhythm Left anterior fascicular block RSR' in V1 or V2, right VCD or RVH No significant change was found Confirmed by Wyvonnia Dusky  MD, [place] [phone]) on 06/11/2016 6:03:48 AM       Radiology Dg Chest 2 View  Result Date: 06/11/2016 CLINICAL DATA:  Chest and left arm pain beginning last night. EXAM: CHEST  2 VIEW COMPARISON:  06/21/2012 FINDINGS: The heart size and mediastinal contours are within normal limits. Aortic atherosclerosis. Both lungs are clear. No evidence of pleural effusion or pneumothorax. The visualized skeletal structures are unremarkable. IMPRESSION: No active cardiopulmonary disease. Aortic atherosclerosis. Electronically Signed   By: Earle Gell M.D.   On: 06/11/2016 07:39    Procedures Procedures (including critical care time)  Medications Ordered in ED Medications  gi cocktail (Maalox,Lidocaine,Donnatal) (not administered)     Initial Impression / Assessment and Plan / ED Course  I have  reviewed the triage vital signs and the nursing notes.  Pertinent labs & imaging results  that were available during my care of the patient were reviewed by me and considered in my medical decision making (see chart for details).  Clinical Course    Pt with symptoms concerning for ACS vs GI.  No known heart complications in the past but had a Cath 2004 for syncope and at that time 25% LAD lesion.  Pt does have risk factors of DM, HTN, HLD and no recent cardiac testing.  Has been at least 3 years.  No associated symptoms.  No infectious sx today and low suspicion for PNA with clear x-ray.  Low risk well's.  Pt did have DVT in the 2000's but no leg pain/swelling or recent surgery.  Low suspicion for dissection/PE or gallbladder pathology.   ASA and NTG given with improvement in sx. EKG, CXR, CBC, BMP, trop without acute findings. Still concern for possible ACS vs GI.  Pt given GI cocktail.   Final Clinical Impressions(s) / ED Diagnoses   Final diagnoses:  Nonspecific chest pain    New Prescriptions New Prescriptions   No medications on file     Blanchie Dessert, MD 06/11/16 [phone]

## 2016-06-11 NOTE — ED Triage Notes (Signed)
Per EMS, pt was woken around 4am from sudden left sided chest "tightness" that radiated down her left arm.  Pt initially reported a 6/10 pain however after 2 nitro pain decreased to 4 out of 10.  Her blood pressure dropped from 160/100 to 102/60 after the second nitro so the nitro was stopped, she was also given 324 of ASA.  Denies SOB however EMS reports wheezing and diminished lung sounds in left side.   Denies n/v however has had diarrhea for 3 days.  Does have hx of bells palsie w/ left sided facial droop and parkinson's.

## 2016-06-11 NOTE — H&P (Signed)
History and Physical    [name] [id number] DOB: 29-Nov-1945 DOA: 06/11/2016   PCP: Arnette Norris, MD   Patient coming from:  Home   Chief Complaint: Chest pain   HPI: [name] is a 70 y.o. female  With a histrory of HLD, HTN, DVT, Syncope, DM 2, anxiety, depression, GERD presenting with substernal chest "tightness" since 4am, waking her up from sleep, accompanied by radiation to the left arm, described as 8/10 initially. Pain was described as sharp and heavy. Pain notworsened with deep inspiration, movement or exertion. Patient took ASA 324 mg on arrival, and then given 2 NTG with some relief 4/10, currently at 2/10. Had no further episodes since admission. Denies any dizziness or falls. No syncope or presyncope on admission.   Last Cardiac cath was on 2004 with 25% LAD stenosis. No recent long distance trips. Denies any new stressors. No new meds. Not on hormonal therapy.  No new herbal supplements. No tobacco . No ETOH or recreational drugs. Denies shortness of breath or cough. Denies anysubjective fever or chills. Denies any nausea, vomiting . She has chronic LLQ pain in the setting of diverticulosis She had intermittent loose stools  3 days prior to admission likely due to flare of diverticulosis, after eating irritants including tomatoes and seeds, but no episodes of diarrhea at the ED. Appetite is normal .Denies any leg swelling or calf pain. Denies any headaches or vision changes. Denies any seizures. No confusion reported. She has a history of Bell's Palsy and Parkinson's.   ED Course:  BP 111/87   Pulse 63   Temp 99.9 F (37.7 C) (Oral)   Resp 15   Ht 5\' 1"  (1.549 m)   Wt 72.6 kg (160 lb)   SpO2 96%   BMI 30.23 kg/m      EKG Sinus rhythm Left anterior fascicular block RSR' in V1 or V2, right VCD or RVH No significant change was found  Tn negative at less than 0.03 white count 8.2 hemoglobin 13.1 platelets 218   sodium 140 potassium 3.9 creatinine 0.77 glucose  120 chest x-rays negative for acute disease.  Review of Systems: As per HPI otherwise 10 point review of systems negative.   Past Medical History:  Diagnosis Date  . Adjustment disorder with depressed mood   . Anxiety   . Bell's palsy    1985  . DEMENTIA   . Depressive disorder, not elsewhere classified   . 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 DVT (deep venous thrombosis), left "early 2000's"   RLE  . Dysmetabolic syndrome X   . Essential hypertension, benign   . [id number]) 05/2011   "pretty often since they put me on Parkinson's medicine"  . History of shingles    "as a teen and in my 44's"  . Mixed hyperlipidemia   . Morbid obesity ([place])   . Neurodegenerative gait disorder   . Parkinson disease [facility])    "they think that's what it is"  . Personal history of thrombophlebitis   . Syncope and collapse 12/06/11   "this am; I pass out fairly often"  . Type II or unspecified type diabetes mellitus without mention of complication, not stated as uncontrolled 12/06/11   "not anymore; had lap band OR"    Past Surgical History:  Procedure Laterality Date  . bladder tack  11/1995  . BREAST BIOPSY  05/20/2000   bilaterally  . BREAST CYST ASPIRATION  12/1998  bilaterally  . CARDIAC CATHETERIZATION  2004  . CATARACT EXTRACTION W/ INTRAOCULAR LENS  IMPLANT, BILATERAL    . FRACTURE SURGERY  02/09/2006   bilateral elbows  . FRACTURE SURGERY     right knee  . FRACTURE SURGERY     tib plateau  . KNEE ARTHROSCOPY  12/12/2002   right   . LAPAROSCOPIC GASTRIC BANDING  2008  . SEPTOPLASTY  08/1998   with antral window   . TONSILLECTOMY AND ADENOIDECTOMY  1970  . TUBAL LIGATION  1970's    Social History Social History   Social History  . Marital status: Widowed    Spouse name: N/A  . Number of children: 2  . Years of education: N/A   Occupational History  . 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 Smoking status: Never Smoker  . Smokeless tobacco: Never Used  . Alcohol use No     Comment: none  . Drug use: No  . Sexual activity: No   Other Topics Concern  . Not on file   Social History Narrative   Lives with daughter and granddaughter.   Daughter is HPOA.   DNR.     Allergies  Allergen Reactions  . Codeine Sulfate     REACTION: hallucinations  . Penicillins Anaphylaxis, Swelling and Rash  . Wellbutrin [Bupropion] Other (See Comments)    Seizures and hallucinations    Family History  Problem Relation Age of Onset  . Asthma Mother   . COPD Father   . Heart disease Father   . Diabetes Sister   . Vision loss Sister     legally blind      Prior to Admission medications   Medication Sig Start Date End Date Taking? Authorizing Provider  ALPRAZolam Duanne Moron) 0.5 MG tablet TAKE 1 TABLET BY MOUTH TWICE A DAY 05/14/16  Yes Lucille Passy, MD  aspirin 81 MG tablet Take 81 mg by mouth daily.    Yes Historical Provider, MD  Bioflavonoid Products (GRAPE SEED PO) Take 1 tablet by mouth daily.    Yes Historical Provider, MD  Calcium Carbonate-Vitamin D (CALCIUM + D PO) Take 600 mg by mouth daily.   Yes Historical Provider, MD  carbidopa-levodopa (SINEMET IR) 25-100 MG tablet TAKE 3 TABLETS BY MOUTH 3 TIMES A DAY 04/13/16  Yes Rebecca S Tat, DO  Cholecalciferol (VITAMIN D3) 1000 UNITS CAPS Take 1 capsule by mouth daily.     Yes Historical Provider, MD  feeding supplement (ENSURE COMPLETE) LIQD Take 237 mLs by mouth 2 (two) times daily between meals. 04/01/12  Yes Robbie Lis, MD  fexofenadine (ALLEGRA) 180 MG tablet Take 180 mg by mouth daily.   Yes Historical Provider, MD  FLUoxetine (PROZAC) 20 MG capsule TAKE ONE CAPSULE BY MOUTH EVERY DAY 12/30/15  Yes Lucille Passy, MD  gabapentin (NEURONTIN) 100 MG capsule TAKE 1 CAPSULE (100 MG TOTAL) BY MOUTH 3 (THREE) TIMES DAILY. 10/01/15  Yes Rebecca S Tat, DO  losartan-hydrochlorothiazide (HYZAAR) 50-12.5 MG tablet TAKE 1 TABLET BY MOUTH  DAILY 03/02/16  Yes Lucille Passy, MD  mirtazapine (REMERON) 30 MG tablet Take 1 tablet (30 mg total) by mouth at bedtime. 04/13/16  Yes Rebecca S Tat, DO  Multiple Vitamins-Minerals (CENTRUM SILVER ADULT 50+) TABS Take by mouth.   Yes Historical Provider, MD  ranitidine (ZANTAC) 150 MG tablet Take 150 mg by mouth daily as needed for heartburn.   Yes  Historical Provider, MD  simvastatin (ZOCOR) 20 MG tablet TAKE 1 TABLET (20 MG TOTAL) BY MOUTH AT BEDTIME. 05/21/16  Yes Lucille Passy, MD  traMADol (ULTRAM) 50 MG tablet Take 1 tablet (50 mg total) by mouth every 6 (six) hours as needed. 03/23/16  Yes Larene Pickett, PA-C  vitamin B-12 (CYANOCOBALAMIN) 1000 MCG tablet Take 1,000 mcg by mouth daily.     Yes Historical Provider, MD    Physical Exam:    Vitals:   06/11/16 0601 06/11/16 0630 06/11/16 0707 06/11/16 0730  BP: 127/78 120/72  111/87  Pulse: 65 64 64 63  Resp: 15 15  15   Temp: 99.9 F (37.7 C)     TempSrc: Oral     SpO2: 100% 99% 100% 96%  Weight: 72.6 kg (160 lb)     Height: 5\' 1"  (1.549 m)          Constitutional: NAD, calm, comfortable  Vitals:   06/11/16 0601 06/11/16 0630 06/11/16 0707 06/11/16 0730  BP: 127/78 120/72  111/87  Pulse: 65 64 64 63  Resp: 15 15  15   Temp: 99.9 F (37.7 C)     TempSrc: Oral     SpO2: 100% 99% 100% 96%  Weight: 72.6 kg (160 lb)     Height: 5\' 1"  (1.549 m)      Eyes: PERRL, lids and conjunctivae normal ENMT: Mucous membranes are moist. Posterior pharynx clear of any exudate or lesions.Normal dentition.  Neck: normal, supple, no masses, no thyromegaly Respiratory: clear to auscultation bilaterally, no wheezing, no crackles. Normal respiratory effort. No accessory muscle use.  Cardiovascular: Regular rate and rhythm, no murmurs / rubs / gallops. No extremity edema. 2+ pedal pulses. No carotid bruits.  Abdomen: come tenderness on the  LLQ but this is chronic, no masses palpated. No hepatosplenomegaly. Bowel sounds positive.    Musculoskeletal: no clubbing / cyanosis. No joint deformity upper and lower extremities. Good ROM, no contractures. Normal muscle tone. No chest wall tenderness  Skin: no rashes, lesions, ulcers.  Neurologic  Sensation intact, DTR normal. Strength 5/5 in all 4. Known Parkinson's, mild, with minimal tremor, L Facial Palsy, chronic  Psychiatric: Normal judgment and insight. Alert and oriented x 3.mildly anxious  mood.     Labs on Admission: I have personally reviewed following labs and imaging studies  CBC:  Recent Labs Lab 06/11/16 0613  WBC 8.2  NEUTROABS 4.8  HGB 13.1  HCT 39.8  MCV 96.1  PLT 99991111    Basic Metabolic Panel:  Recent Labs Lab 06/11/16 0613  NA 140  K 3.9  CL 101  CO2 30  GLUCOSE 120*  BUN 20  CREATININE 0.77  CALCIUM 10.1    GFR: Estimated Creatinine Clearance: 58.8 mL/min (by C-G formula based on SCr of 0.77 mg/dL).  Liver Function Tests: No results for input(s): AST, ALT, ALKPHOS, BILITOT, PROT, ALBUMIN in the last 168 hours. No results for input(s): LIPASE, AMYLASE in the last 168 hours. No results for input(s): AMMONIA in the last 168 hours.  Coagulation Profile: No results for input(s): INR, PROTIME in the last 168 hours.  Cardiac Enzymes:  Recent Labs Lab 06/11/16 0613  TROPONINI <0.03    BNP (last 3 results) No results for input(s): PROBNP in the last 8760 hours.  HbA1C: No results for input(s): HGBA1C in the last 72 hours.  CBG: No results for input(s): GLUCAP in the last 168 hours.  Lipid Profile: No results for input(s): CHOL, HDL, LDLCALC, TRIG, CHOLHDL, LDLDIRECT  in the last 72 hours.  Thyroid Function Tests: No results for input(s): TSH, T4TOTAL, FREET4, T3FREE, THYROIDAB in the last 72 hours.  Anemia Panel: No results for input(s): VITAMINB12, FOLATE, FERRITIN, TIBC, IRON, RETICCTPCT in the last 72 hours.  Urine analysis:    Component Value Date/Time   COLORURINE YELLOW 03/23/2016 1350   APPEARANCEUR CLOUDY  (A) 03/23/2016 1350   LABSPEC 1.024 03/23/2016 1350   PHURINE 7.5 03/23/2016 1350   GLUCOSEU NEGATIVE 03/23/2016 1350   HGBUR NEGATIVE 03/23/2016 1350   BILIRUBINUR neg 04/22/2016 1150   KETONESUR 15 (A) 03/23/2016 1350   PROTEINUR neg 04/22/2016 1150   PROTEINUR NEGATIVE 03/23/2016 1350   UROBILINOGEN 0.2 04/22/2016 1150   UROBILINOGEN 0.2 04/04/2012 0200   NITRITE neg 04/22/2016 1150   NITRITE NEGATIVE 03/23/2016 1350   LEUKOCYTESUR Negative 04/22/2016 1150    Sepsis Labs: @LABRCNTIP (procalcitonin:4,lacticidven:4) )No results found for this or any previous visit (from the past 240 hour(s)).   Radiological Exams on Admission: Dg Chest 2 View  Result Date: 06/11/2016 CLINICAL DATA:  Chest and left arm pain beginning last night. EXAM: CHEST  2 VIEW COMPARISON:  06/21/2012 FINDINGS: The heart size and mediastinal contours are within normal limits. Aortic atherosclerosis. Both lungs are clear. No evidence of pleural effusion or pneumothorax. The visualized skeletal structures are unremarkable. IMPRESSION: No active cardiopulmonary disease. Aortic atherosclerosis. Electronically Signed   By: Earle Gell M.D.   On: 06/11/2016 07:39    EKG: Independently reviewed.  Assessment/Plan Active Problems:   Chest pain at rest   HYPERLIPIDEMIA, MIXED   OBESITY, MORBID   ANXIETY DEPRESSION   HYPERTENSION, BENIGN ESSENTIAL   Loss of perception for taste   Loss, sense of, smell   Bell's palsy   DNR (do not resuscitate)   History of recurrent UTIs   Diabetes mellitus type 2, diet-controlled (HCC)   GERD (gastroesophageal reflux disease)     Chest pain syndrome, cardiac versus musculoskeletal vs anxietyvs.GERD  HEART score 4 . Troponin less than 0.03 , EKG without evidence of ACS. CP relieved by nitroglycerin, aspirin. CXR unrevealing.  No recent 2 D echo. Last cath in 2004  with 25% LAD stenosis and has not had a recent cardiology follow up . RF include DM, HTN, HLD, h/p DVT  Admit to  Telemetry/ Observation Chest pain order set Cycle troponins EKG in am continue ASA, O2 and NTG as needed Continue heart meds Statins  GI cocktail Check Lipid panel  Hb A1C  Hypertension BP 111/87   Pulse 63  Did not take her meds this morning Continue home anti-hypertensive medications   Hyperlipidemia Continue home statins  GERD, no acute symptoms: Continue PPI GI cocktail    Type II Diabetes, diet controlled  Current blood sugar level is 120 Lab Results  Component Value Date   HGBA1C 6.2 12/01/2015  Hgb A1C Heart healthy carb modified diet.  Chronic LLQ pain in the setting of diverticulosis  Possible flare yesterday. No active issues at this time. Afebrile. No GIB. CBC and CMET normal  Will need to follow up with PCP as OP, may need GI evaluation once discharged.   History of Bell's Palsy History of Parkinson's Continue Sinemet sand Neurontin  Anxiety Continue home Xanax   Depression Continue Prozac, Remeron   DVT prophylaxis: Lovenox   Code Status:   DNR Family Communication:  Discussed with patient Disposition Plan: Expect patient to be discharged to home after condition improves Consults called:    None Admission status:Tele  Obs  Sharene Butters E, PA-C [facility]   06/11/2016, 8:15 AM

## 2016-06-12 DIAGNOSIS — I10 Essential (primary) hypertension: Secondary | ICD-10-CM | POA: Diagnosis not present

## 2016-06-12 DIAGNOSIS — K219 Gastro-esophageal reflux disease without esophagitis: Secondary | ICD-10-CM | POA: Diagnosis not present

## 2016-06-12 DIAGNOSIS — R072 Precordial pain: Secondary | ICD-10-CM | POA: Diagnosis not present

## 2016-06-12 DIAGNOSIS — K5790 Diverticulosis of intestine, part unspecified, without perforation or abscess without bleeding: Secondary | ICD-10-CM | POA: Diagnosis not present

## 2016-06-12 DIAGNOSIS — R079 Chest pain, unspecified: Secondary | ICD-10-CM | POA: Diagnosis not present

## 2016-06-12 DIAGNOSIS — M79602 Pain in left arm: Secondary | ICD-10-CM | POA: Diagnosis not present

## 2016-06-12 DIAGNOSIS — G510 Bell's palsy: Secondary | ICD-10-CM

## 2016-06-12 DIAGNOSIS — E119 Type 2 diabetes mellitus without complications: Secondary | ICD-10-CM

## 2016-06-12 DIAGNOSIS — F341 Dysthymic disorder: Secondary | ICD-10-CM

## 2016-06-12 DIAGNOSIS — R438 Other disturbances of smell and taste: Secondary | ICD-10-CM | POA: Diagnosis not present

## 2016-06-12 DIAGNOSIS — E782 Mixed hyperlipidemia: Secondary | ICD-10-CM

## 2016-06-12 LAB — GLUCOSE, CAPILLARY
Glucose-Capillary: 98 mg/dL (ref 65–99)
Glucose-Capillary: 123 mg/dL — ABNORMAL HIGH (ref 65–99)
Glucose-Capillary: 84 mg/dL (ref 65–99)
Glucose-Capillary: 134 mg/dL — ABNORMAL HIGH (ref 65–99)

## 2016-06-12 LAB — HEMOGLOBIN A1C
Hgb A1c MFr Bld: 6.1 % — ABNORMAL HIGH (ref 4.8–5.6)
Mean Plasma Glucose: 128 mg/dL

## 2016-06-12 NOTE — Consult Note (Addendum)
CARDIOLOGY CONSULT NOTE  Patient ID: [name], MRN: [id number], DOB/AGE: 71-Oct-1947 71 y.o. Admit date: 06/11/2016 Date of Consult: 06/12/2016  Primary Physician: Arnette Norris, MD Primary Cardiologist: *TW remotely Consulting Physician hosptial  Chief Complaint: chest pain    HPI [name] is a 70 y.o. female  Admitted yesterday with chest pain. She describes it as a pressure with radiation into her neck and her left arm. It was associated with diaphoresis. It awakened her from sleep. It was unlike anything she had ever had. EMS was called and was present relatively quickly so that her total symptom time she thinks was less than 30 minutes. They treated her with aspirin and nitroglycerin with resolution.  She has noted no change in her exercise tolerance. She has no exertional dyspnea nor chest discomfort.  She has a bariatric surgery. This has been complicated more recently by substernal chest discomfort associated brackish taste and the need to cough. It has been presumed to be reflux.  Serial troponins negative; ECGs were unrevealing. There is left axis deviation and RSR prime in lead V1.  Past medical history is notable for diabetes hypertension dyslipidemia, but these were reversed with bariatric surgery 2007 or so. She underwent cath 2004 which was neg  She has hx of syncope and had a loop recorder for awhile, but that was removed.. None recently  She has a history of Bell's palsy 2  Past Medical History:  Diagnosis Date  . Adjustment disorder with depressed mood   . Anxiety   . Bell's palsy    1985  . DEMENTIA   . Depressive disorder, not elsewhere classified   . 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 DVT (deep venous thrombosis), left "early 2000's"   RLE  . Dysmetabolic syndrome X   . Essential hypertension, benign   . [id number]) 05/2011   "pretty often since they put me on Parkinson's medicine"  . History  of shingles    "as a teen and in my 72's"  . Mixed hyperlipidemia   . Morbid obesity ([place])   . Neurodegenerative gait disorder   . Parkinson disease [facility])    "they think that's what it is"  . Personal history of thrombophlebitis   . Syncope and collapse 12/06/11   "this am; I pass out fairly often"  . Type II or unspecified type diabetes mellitus without mention of complication, not stated as uncontrolled 12/06/11   "not anymore; had lap band OR"      Surgical History:  Past Surgical History:  Procedure Laterality Date  . bladder tack  11/1995  . BREAST BIOPSY  05/20/2000   bilaterally  . BREAST CYST ASPIRATION  12/1998   bilaterally  . CARDIAC CATHETERIZATION  2004  . CATARACT EXTRACTION W/ INTRAOCULAR LENS  IMPLANT, BILATERAL    . FRACTURE SURGERY  02/09/2006   bilateral elbows  . FRACTURE SURGERY     right knee  . FRACTURE SURGERY     tib plateau  . KNEE ARTHROSCOPY  12/12/2002   right   . LAPAROSCOPIC GASTRIC BANDING  2008  . SEPTOPLASTY  08/1998   with antral window   . TONSILLECTOMY AND ADENOIDECTOMY  1970  . TUBAL LIGATION  1970's     Home Meds: Prior to Admission medications   Medication Sig Start Date End Date Taking? Authorizing Provider  ALPRAZolam Duanne Moron) 0.5 MG tablet TAKE 1 TABLET BY MOUTH TWICE A DAY 05/14/16  Yes Lucille Passy, MD  aspirin 81 MG tablet Take 81 mg by mouth daily.    Yes Historical Provider, MD  Bioflavonoid Products (GRAPE SEED PO) Take 1 tablet by mouth daily.    Yes Historical Provider, MD  Calcium Carbonate-Vitamin D (CALCIUM + D PO) Take 600 mg by mouth daily.   Yes Historical Provider, MD  carbidopa-levodopa (SINEMET IR) 25-100 MG tablet TAKE 3 TABLETS BY MOUTH 3 TIMES A DAY 04/13/16  Yes Rebecca S Tat, DO  Cholecalciferol (VITAMIN D3) 1000 UNITS CAPS Take 1 capsule by mouth daily.     Yes Historical Provider, MD  feeding supplement (ENSURE COMPLETE) LIQD Take 237 mLs by mouth 2 (two) times daily between meals. 04/01/12  Yes Robbie Lis,  MD  fexofenadine (ALLEGRA) 180 MG tablet Take 180 mg by mouth daily.   Yes Historical Provider, MD  FLUoxetine (PROZAC) 20 MG capsule TAKE ONE CAPSULE BY MOUTH EVERY DAY 12/30/15  Yes Lucille Passy, MD  gabapentin (NEURONTIN) 100 MG capsule TAKE 1 CAPSULE (100 MG TOTAL) BY MOUTH 3 (THREE) TIMES DAILY. 10/01/15  Yes Rebecca S Tat, DO  losartan-hydrochlorothiazide (HYZAAR) 50-12.5 MG tablet TAKE 1 TABLET BY MOUTH DAILY 03/02/16  Yes Lucille Passy, MD  mirtazapine (REMERON) 30 MG tablet Take 1 tablet (30 mg total) by mouth at bedtime. 04/13/16  Yes Rebecca S Tat, DO  Multiple Vitamins-Minerals (CENTRUM SILVER ADULT 50+) TABS Take by mouth.   Yes Historical Provider, MD  ranitidine (ZANTAC) 150 MG tablet Take 150 mg by mouth daily as needed for heartburn.   Yes Historical Provider, MD  simvastatin (ZOCOR) 20 MG tablet TAKE 1 TABLET (20 MG TOTAL) BY MOUTH AT BEDTIME. 05/21/16  Yes Lucille Passy, MD  traMADol (ULTRAM) 50 MG tablet Take 1 tablet (50 mg total) by mouth every 6 (six) hours as needed. 03/23/16  Yes Larene Pickett, PA-C  vitamin B-12 (CYANOCOBALAMIN) 1000 MCG tablet Take 1,000 mcg by mouth daily.     Yes Historical Provider, MD    Inpatient Medications:  . ALPRAZolam  0.5 mg Oral BID  . aspirin EC  81 mg Oral Daily  . carbidopa-levodopa  3 tablet Oral TID  . enoxaparin (LOVENOX) injection  40 mg Subcutaneous Q24H  . FLUoxetine  20 mg Oral Daily  . gabapentin  100 mg Oral TID  . losartan  50 mg Oral Daily   And  . hydrochlorothiazide  12.5 mg Oral Daily  . mirtazapine  30 mg Oral QHS  . simvastatin  20 mg Oral q1800    Allergies:  Allergies  Allergen Reactions  . Codeine Sulfate     REACTION: hallucinations  . Penicillins Anaphylaxis, Swelling and Rash  . Wellbutrin [Bupropion] Other (See Comments)    Seizures and hallucinations    Social History   Social History  . Marital status: Widowed    Spouse name: N/A  . Number of children: 2  . Years of education: N/A   Occupational  History  . 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 Smoking status: Never Smoker  . Smokeless tobacco: Never Used  . Alcohol use No     Comment: none  . Drug use: No  . Sexual activity: No   Other Topics Concern  . Not on file   Social History Narrative   Lives with daughter and granddaughter.   Daughter is HPOA.   DNR.     Family History  Problem Relation Age of Onset  . Asthma Mother   . COPD Father   . Heart disease Father   . Diabetes Sister   . Vision loss Sister     legally blind     ROS:  Please see the history of present illness.     All other systems reviewed and negative.    Physical Exam: Blood pressure (!) 110/44, pulse 72, temperature 98.1 F (36.7 C), temperature source Oral, resp. rate 16, height 5\' 1"  (1.549 m), weight 156 lb 14.4 oz (71.2 kg), SpO2 96 %. General: Well developed, well nourished female in no acute distress. Head: Normocephalic, atraumatic, sclera non-icteric, no xanthomas, nares are without discharge. EENT: normal  Lymph Nodes:  none Neck: Negative for carotid bruits. JVD not elevated. Back:without scoliosis kyphosis Lungs: Clear bilaterally to auscultation without wheezes, rales, or rhonchi. Breathing is unlabored. Heart: RRR with S1 S2. No  murmur . No rubs, or gallops appreciated. Abdomen: Soft, non-tender, non-distended with normoactive bowel sounds. No hepatomegaly. No rebound/guarding. No obvious abdominal masses. Msk:  Strength and tone appear normal for age. Extremities: No clubbing or cyanosis. No edema.  Distal pedal pulses are 2+ and equal bilaterally. Skin: Warm and Dry Neuro: Alert and oriented X 3. Residual facial asymmetry related to Bell's palsy Grossly normal sensory and motor function . Psych:  Responds to questions appropriately with a normal affect.      Labs: Cardiac Enzymes  Recent Labs  06/11/16 0613 06/11/16 0841 06/11/16 1111 06/11/16 1428  TROPONINI <0.03  <0.03 <0.03 <0.03   CBC Lab Results  Component Value Date   WBC 8.2 06/11/2016   HGB 13.1 06/11/2016   HCT 39.8 06/11/2016   MCV 96.1 06/11/2016   PLT 218 06/11/2016   PROTIME: No results for input(s): LABPROT, INR in the last 72 hours. Chemistry  Recent Labs Lab 06/11/16 0613  NA 140  K 3.9  CL 101  CO2 30  BUN 20  CREATININE 0.77  CALCIUM 10.1  GLUCOSE 120*   Lipids Lab Results  Component Value Date   CHOL 170 06/11/2016   HDL 58 06/11/2016   LDLCALC 78 06/11/2016   TRIG 171 (H) 06/11/2016   BNP No results found for: PROBNP Thyroid Function Tests: No results for input(s): TSH, T4TOTAL, T3FREE, THYROIDAB in the last 72 hours.  Invalid input(s): FREET3 Miscellaneous No results found for: DDIMER  Radiology/Studies:  Dg Chest 2 View  Result Date: 06/11/2016 CLINICAL DATA:  Chest and left arm pain beginning last night. EXAM: CHEST  2 VIEW COMPARISON:  06/21/2012 FINDINGS: The heart size and mediastinal contours are within normal limits. Aortic atherosclerosis. Both lungs are clear. No evidence of pleural effusion or pneumothorax. The visualized skeletal structures are unremarkable. IMPRESSION: No active cardiopulmonary disease. Aortic atherosclerosis. Electronically Signed   By: Earle Gell M.D.   On: 06/11/2016 07:39   Lipids 5/17 Normal LDL 67 HDL 59  EKG: Normal sinus rhythm with left axis deviation and RSR prime. There was no evidence of ST-T changes  Assessment and Plan:   Chest pain with typical and atypical features  History of gastric bypass surgery with reflux   The patient had a single episode of chest discomfort concerning for coronary disease. Her pretest likelihood's are moderate; the duration of it makes the negative troponins less specific.  It is notable that the attributed cardiac risk factors in the admission note of hypertension and diabetes probably are no longer valid as they have been gone for 10 years following  her gastric bypass  surgery.  I would recommend stress testing. Given her Parkinson's disease, it would be preferably done with Myoview. I think her risks are low enough that it could be done as an outpatient. It would be reasonable in the interim to treat her with low-dose aspirin.  Unfortunately outpatient capacity is not available. We'll arrange for the morning.     Virl Axe

## 2016-06-12 NOTE — Progress Notes (Signed)
PROGRESS NOTE    [name]  [id number] DOB: [birth year] DOA: 06/11/2016 PCP: Arnette Norris, MD   Brief Narrative:  [name] is a 70 y.o. female  With a histrory of HLD, HTN, DVT, Syncope, DM 2, anxiety, depression, GERD presenting with substernal chest "tightness" since 4am, waking her up from sleep, accompanied by radiation to the left arm, described as 8/10 initially. Pain was described as sharp and heavy. Pain notworsened with deep inspiration, movement or exertion. Patient took ASA 324 mg on arrival, and then given 2 NTG with some relief 4/10, currently at 2/10. Had no further episodes since admission. Denies any dizziness or falls. No syncope or presyncope on admission.   Last Cardiac cath was on 2004 with 25% LAD stenosis. No recent long distance trips. Denies any new stressors. No new meds. Not on hormonal therapy.  No new herbal supplements. No tobacco . No ETOH or recreational drugs. Denies shortness of breath or cough. Denies anysubjective fever or chills. Denies any nausea, vomiting . She has chronic LLQ pain in the setting of diverticulosis She had intermittent loose stools  3 days prior to admission likely due to flare of diverticulosis, after eating irritants including tomatoes and seeds, but no episodes of diarrhea at the ED. Appetite is normal .Denies any leg swelling or calf pain. Denies any headaches or vision changes. Denies any seizures. No confusion reported. She has a history of Bell's Palsy and Parkinson's. She was admitted for a Chest Pain r/o and Cardiology was consulted and she will undergo a Myoview Stress Test in the Am.   Assessment & Plan:   Active Problems:   HYPERLIPIDEMIA, MIXED   OBESITY, MORBID   ANXIETY DEPRESSION   HYPERTENSION, BENIGN ESSENTIAL   Loss of perception for taste   Loss, sense of, smell   Bell's palsy   DNR (do not resuscitate)   History of recurrent UTIs   Diabetes mellitus type 2, diet-controlled (HCC)   GERD  (gastroesophageal reflux disease)   Chest pain at rest  Chest Pain r/o ACS -Patients Troponin's <0.03 x4 -HEART score 4 .  -EKG without evidence of ACS.  -CP relieved by Nitroglycerin, Aspirin.  -Last cath in 2004  with 25% LAD stenosis and has not had a recent cardiology follow up.  -RF include DM, HTN, HLD, h/o DVT -[facility] Consulted for further Recc's and Evaluation -Patient to be made NPO at [place] and undergo MyoView Stress Test in AM -Echocardiogram Pending; Repeat EKG in AM -C/w ASA 81 mg, Simvastatin 20 mg -Lipid Panel showed Cholesterol of 170, TG of 171, HDL of 58, LDL of 78, and VLDL of 34 -Last HbA1c was 6.2 in 11/2015; Repeat was 6.1 -NPO at Midnight for MyoView Stress Test   Hypertension  -C/w Losartan/HCTZ 50/12.5 mg po Daily  Hyperlipidemia -Lipid Panel showed Cholesterol of 170, TG of 171, HDL of 58, LDL of 78, and VLDL of 34 -C/w Simvastatin 20 mg Daily  Diet Controlled Type II Diabetes Mellitus -Repeat HbA1c was 6.1 -Continue CBG Monitoring -Gave Patient Heart Healthy/Carb Modified Diet  Chronic LLQ pain in the setting of diverticulosis -Improving. Will need outpatient GI evaluation.  History of Bell's Palsy x2 and Parkinson's Disease -Continue Carbidopa-Levodopa 3 tablet po TID -C/w Gabapentin 100 mg po TID  Anxiety -Continue home Xanax 0.5 mg po BID  Depression -Continue Mirtazapine 30 mg po qHS and Fluoxetine 20 mg po Daily  DVT prophylaxis: Lovenox Code Status: DNR Family Communication: No Family at Bedside Disposition  Plan: Myoview Stress Test in AM and likely Home if Negative  Consultants:  Cardiology - Dr. Caryl Comes  Procedures: Myoview Stress Test in AM  Antimicrobials:  None  Subjective: Seen and examined and stated CP resolved. No N/V or Lightheadedness or Dizziness. Stated   Objective: Vitals:   06/11/16 1605 06/11/16 2114 06/12/16 0500 06/12/16 1358  BP: 111/61 119/62 (!) 118/45 (!) 110/44  Pulse: 71 63 61 72   Resp: 18 18 14 16   Temp: 98.1 F (36.7 C) 98.1 F (36.7 C) 97.9 F (36.6 C) 98.1 F (36.7 C)  TempSrc: Oral Oral Oral Oral  SpO2: 98% 100% 98% 96%  Weight: 71.2 kg (156 lb 14.4 oz)     Height: 5\' 1"  (1.549 m)       Intake/Output Summary (Last 24 hours) at 06/12/16 1649 Last data filed at 06/12/16 0659  Gross per 24 hour  Intake              240 ml  Output              350 ml  Net             -110 ml   Filed Weights   06/11/16 0601 06/11/16 1605  Weight: 72.6 kg (160 lb) 71.2 kg (156 lb 14.4 oz)   Examination: Physical Exam:  Constitutional: WN/WD, NAD and appears calm and comfortable Eyes: Lids and conjunctivae normal, sclerae anicteric  ENMT: External Ears, Nose appear normal. Grossly normal hearing. Mucous membranes are moist.  Neck: Appears normal, supple, no cervical masses, normal ROM, no appreciable thyromegaly, no JVD Respiratory: Clear to auscultation bilaterally, no wheezing, rales, rhonchi or crackles. Normal respiratory effort and patient is not tachypenic. No accessory muscle use.  Cardiovascular: RRR, no murmurs / rubs / gallops. S1 and S2 auscultated. No extremity edema. Abdomen: Soft, Tender to palpate in Left Lower Qudrant, Distended 2/2 to Body Habitus. No masses palpated. No appreciable hepatosplenomegaly. Bowel sounds positive x4.  GU: Deferred. Musculoskeletal: No clubbing / cyanosis of digits/nails. No joint deformity upper and lower extremities. No contractures. Normal strength and muscle tone.  Skin: No rashes, lesions, ulcers. No induration; Warm and dry.  Neurologic: CN 2-12 grossly intact with no focal deficits. Sensation intact in all 4 Extremities, Romberg sign cerebellar reflexes not assessed.  Psychiatric: Normal judgment and insight. Alert and oriented x 3. Normal mood and appropriate affect.   Data Reviewed: I have personally reviewed following labs and imaging studies  CBC:  Recent Labs Lab 06/11/16 0613  WBC 8.2  NEUTROABS 4.8  HGB  13.1  HCT 39.8  MCV 96.1  PLT 99991111   Basic Metabolic Panel:  Recent Labs Lab 06/11/16 0613  NA 140  K 3.9  CL 101  CO2 30  GLUCOSE 120*  BUN 20  CREATININE 0.77  CALCIUM 10.1   GFR: Estimated Creatinine Clearance: 58.2 mL/min (by C-G formula based on SCr of 0.77 mg/dL). Liver Function Tests: No results for input(s): AST, ALT, ALKPHOS, BILITOT, PROT, ALBUMIN in the last 168 hours. No results for input(s): LIPASE, AMYLASE in the last 168 hours. No results for input(s): AMMONIA in the last 168 hours. Coagulation Profile: No results for input(s): INR, PROTIME in the last 168 hours. Cardiac Enzymes:  Recent Labs Lab 06/11/16 0613 06/11/16 0841 06/11/16 1111 06/11/16 1428  TROPONINI <0.03 <0.03 <0.03 <0.03   BNP (last 3 results) No results for input(s): PROBNP in the last 8760 hours. HbA1C:  Recent Labs  06/11/16 0841  HGBA1C 6.1*  CBG:  Recent Labs Lab 06/11/16 2110 06/12/16 0628 06/12/16 1125  GLUCAP 112* 98 123*   Lipid Profile:  Recent Labs  06/11/16 0841  CHOL 170  HDL 58  LDLCALC 78  TRIG 171*  CHOLHDL 2.9   Thyroid Function Tests: No results for input(s): TSH, T4TOTAL, FREET4, T3FREE, THYROIDAB in the last 72 hours. Anemia Panel: No results for input(s): VITAMINB12, FOLATE, FERRITIN, TIBC, IRON, RETICCTPCT in the last 72 hours. Sepsis Labs: No results for input(s): PROCALCITON, LATICACIDVEN in the last 168 hours.  No results found for this or any previous visit (from the past 240 hour(s)).   Radiology Studies: Dg Chest 2 View  Result Date: 06/11/2016 CLINICAL DATA:  Chest and left arm pain beginning last night. EXAM: CHEST  2 VIEW COMPARISON:  06/21/2012 FINDINGS: The heart size and mediastinal contours are within normal limits. Aortic atherosclerosis. Both lungs are clear. No evidence of pleural effusion or pneumothorax. The visualized skeletal structures are unremarkable. IMPRESSION: No active cardiopulmonary disease. Aortic  atherosclerosis. Electronically Signed   By: Earle Gell M.D.   On: 06/11/2016 07:39   Scheduled Meds: . ALPRAZolam  0.5 mg Oral BID  . aspirin EC  81 mg Oral Daily  . carbidopa-levodopa  3 tablet Oral TID  . enoxaparin (LOVENOX) injection  40 mg Subcutaneous Q24H  . FLUoxetine  20 mg Oral Daily  . gabapentin  100 mg Oral TID  . losartan  50 mg Oral Daily   And  . hydrochlorothiazide  12.5 mg Oral Daily  . mirtazapine  30 mg Oral QHS  . simvastatin  20 mg Oral q1800   Continuous Infusions:   LOS: 0 days   Kerney Elbe, DO [facility] Pager [phone]  If 7PM-7AM, please contact night-coverage [email or web address] Password [facility] 06/12/2016, 4:49 PM

## 2016-06-13 ENCOUNTER — Observation Stay (HOSPITAL_COMMUNITY): Payer: Medicare Other

## 2016-06-13 ENCOUNTER — Observation Stay (HOSPITAL_BASED_OUTPATIENT_CLINIC_OR_DEPARTMENT_OTHER): Payer: Medicare Other

## 2016-06-13 DIAGNOSIS — K5790 Diverticulosis of intestine, part unspecified, without perforation or abscess without bleeding: Secondary | ICD-10-CM | POA: Diagnosis not present

## 2016-06-13 DIAGNOSIS — G510 Bell's palsy: Secondary | ICD-10-CM | POA: Diagnosis not present

## 2016-06-13 DIAGNOSIS — R079 Chest pain, unspecified: Secondary | ICD-10-CM

## 2016-06-13 DIAGNOSIS — M79602 Pain in left arm: Secondary | ICD-10-CM | POA: Diagnosis not present

## 2016-06-13 DIAGNOSIS — I10 Essential (primary) hypertension: Secondary | ICD-10-CM

## 2016-06-13 DIAGNOSIS — R438 Other disturbances of smell and taste: Secondary | ICD-10-CM | POA: Diagnosis not present

## 2016-06-13 DIAGNOSIS — R072 Precordial pain: Secondary | ICD-10-CM | POA: Diagnosis not present

## 2016-06-13 DIAGNOSIS — E119 Type 2 diabetes mellitus without complications: Secondary | ICD-10-CM | POA: Diagnosis not present

## 2016-06-13 DIAGNOSIS — K219 Gastro-esophageal reflux disease without esophagitis: Secondary | ICD-10-CM | POA: Diagnosis not present

## 2016-06-13 LAB — COMPREHENSIVE METABOLIC PANEL
ALT: <5 U/L — ABNORMAL LOW (ref 14–54)
AST: 16 U/L (ref 15–41)
Albumin: 3.1 g/dL — ABNORMAL LOW (ref 3.5–5.0)
Alkaline Phosphatase: 62 U/L (ref 38–126)
Anion gap: 7 (ref 5–15)
BUN: 16 mg/dL (ref 6–20)
CO2: 27 mmol/L (ref 22–32)
Calcium: 9.1 mg/dL (ref 8.9–10.3)
Chloride: 105 mmol/L (ref 101–111)
Creatinine, Ser: 0.68 mg/dL (ref 0.44–1.00)
GFR calc Af Amer: >60 mL/min (ref >60)
GFR calc non Af Amer: >60 mL/min (ref >60)
Glucose, Bld: 116 mg/dL — ABNORMAL HIGH (ref 65–99)
Potassium: 3.5 mmol/L (ref 3.5–5.1)
Sodium: 139 mmol/L (ref 135–145)
Total Bilirubin: 0.3 mg/dL (ref 0.3–1.2)
Total Protein: 5.4 g/dL — ABNORMAL LOW (ref 6.5–8.1)

## 2016-06-13 LAB — CBC WITH DIFFERENTIAL/PLATELET
Basophils Absolute: 0 10*3/uL (ref 0.0–0.1)
Basophils Relative: 0 %
Eosinophils Absolute: 0.3 10*3/uL (ref 0.0–0.7)
Eosinophils Relative: 4 %
HCT: 39.1 % (ref 36.0–46.0)
Hemoglobin: 12.6 g/dL (ref 12.0–15.0)
Lymphocytes Relative: 29 %
Lymphs Abs: 2.4 10*3/uL (ref 0.7–4.0)
MCH: 31 pg (ref 26.0–34.0)
MCHC: 32.2 g/dL (ref 30.0–36.0)
MCV: 96.1 fL (ref 78.0–100.0)
Monocytes Absolute: 0.6 10*3/uL (ref 0.1–1.0)
Monocytes Relative: 7 %
Neutro Abs: 4.9 10*3/uL (ref 1.7–7.7)
Neutrophils Relative %: 60 %
Platelets: 204 10*3/uL (ref 150–400)
RBC: 4.07 MIL/uL (ref 3.87–5.11)
RDW: 13 % (ref 11.5–15.5)
WBC: 8.2 10*3/uL (ref 4.0–10.5)

## 2016-06-13 LAB — GLUCOSE, CAPILLARY
Glucose-Capillary: 75 mg/dL (ref 65–99)
Glucose-Capillary: 119 mg/dL — ABNORMAL HIGH (ref 65–99)
Glucose-Capillary: 95 mg/dL (ref 65–99)
Glucose-Capillary: 94 mg/dL (ref 65–99)

## 2016-06-13 LAB — ECHOCARDIOGRAM COMPLETE
Height: 61 in
Weight: 2510.4 oz

## 2016-06-13 LAB — MAGNESIUM: Magnesium: 1.9 mg/dL (ref 1.7–2.4)

## 2016-06-13 LAB — NM MYOCAR MULTI W/SPECT W/WALL MOTION / EF
Peak HR: 82 {beats}/min
Rest HR: 68 {beats}/min

## 2016-06-13 LAB — PHOSPHORUS: Phosphorus: 3.6 mg/dL (ref 2.5–4.6)

## 2016-06-13 MED ORDER — REGADENOSON 0.4 MG/5ML IV SOLN
0.4000 mg | Freq: Once | INTRAVENOUS | Status: AC
  Administered 2016-06-13: 0.4 mg via INTRAVENOUS
  Filled 2016-06-13: qty 5

## 2016-06-13 MED ORDER — TECHNETIUM TC 99M TETROFOSMIN IV KIT
30.0000 | PACK | Freq: Once | INTRAVENOUS | Status: AC | PRN
  Administered 2016-06-13: 30 via INTRAVENOUS

## 2016-06-13 MED ORDER — TECHNETIUM TC 99M TETROFOSMIN IV KIT
10.0000 | PACK | Freq: Once | INTRAVENOUS | Status: AC | PRN
  Administered 2016-06-13: 10 via INTRAVENOUS

## 2016-06-13 MED ORDER — REGADENOSON 0.4 MG/5ML IV SOLN
INTRAVENOUS | Status: AC
  Filled 2016-06-13: qty 5

## 2016-06-13 NOTE — Progress Notes (Signed)
PROGRESS NOTE    [name]  [id number] DOB: 02-25-1946 DOA: 06/11/2016 PCP: Arnette Norris, MD   Brief Narrative:  [name] is a 70 y.o. female  With a histrory of HLD, HTN, DVT, Syncope, DM 2, anxiety, depression, GERD presenting with substernal chest "tightness" since 4am, waking her up from sleep, accompanied by radiation to the left arm, described as 8/10 initially. Pain was described as sharp and heavy. Pain notworsened with deep inspiration, movement or exertion. Patient took ASA 324 mg on arrival, and then given 2 NTG with some relief 4/10, currently at 2/10. Had no further episodes since admission. Denies any dizziness or falls. No syncope or presyncope on admission.   Last Cardiac cath was on 2004 with 25% LAD stenosis. No recent long distance trips. Denies any new stressors. No new meds. Not on hormonal therapy.  No new herbal supplements. No tobacco . No ETOH or recreational drugs. Denies shortness of breath or cough. Denies anysubjective fever or chills. Denies any nausea, vomiting . She has chronic LLQ pain in the setting of diverticulosis She had intermittent loose stools  3 days prior to admission likely due to flare of diverticulosis, after eating irritants including tomatoes and seeds, but no episodes of diarrhea at the ED. Appetite is normal .Denies any leg swelling or calf pain. Denies any headaches or vision changes. Denies any seizures. No confusion reported. She has a history of Bell's Palsy and Parkinson's. She was admitted for a Chest Pain r/o and Cardiology was consulted and she will underwent Myoview Stress test today which showed Medium sized area of decreased activity within the inferior wall on stress images as compared to rest - most likely secondary to correction from large amount of bowel/gallbladder activity on stress images only. Inducible ischemia is considered much less likely. Awaiting Cardiology input on Myoview Findings.  Assessment & Plan:     Active Problems:   HYPERLIPIDEMIA, MIXED   OBESITY, MORBID   ANXIETY DEPRESSION   HYPERTENSION, BENIGN ESSENTIAL   Loss of perception for taste   Loss, sense of, smell   Bell's palsy   DNR (do not resuscitate)   History of recurrent UTIs   Diabetes mellitus type 2, diet-controlled (HCC)   GERD (gastroesophageal reflux disease)   Chest pain at rest  Chest Pain r/o ACS -Patients Troponin's <0.03 x4 -HEART score 4 .  -EKG without evidence of ACS.  -CP relieved by Nitroglycerin, Aspirin.  -Last cath in 2004  with 25% LAD stenosis and has not had a recent cardiology follow up.  -RF include DM, HTN, HLD, h/o DVT -[name] Consulted for further Recc's and Evaluation -Myoview Stress Test showed Medium sized area of decreased activity within the inferior wall on stress images as compared to rest - most likely secondary to correction from large amount of bowel/gallbladder activity on stress images only. Inducible ischemia is considered much less likely. Mild septal hypokinesis was also seen. Left ventricular ejection fraction 67%. Per reading Cardiologist - Non invasive risk stratification*: Low -Echocardiogram showed Compared to a prior study in 2013, there is now moderate LVH with   grade 1 DD and elevated LV filling pressures. -C/w ASA 81 mg, Simvastatin 20 mg -Lipid Panel showed Cholesterol of 170, TG of 171, HDL of 58, LDL of 78, and VLDL of 34 -Last HbA1c was 6.2 in 11/2015; Repeat was 6.1 -Spoke with Dr. Fransico Him and notified her that [place] had been done and she stated she will review findings and based  on the results let me know about possible further testing vs. Discharge for patient. -Awaiting Cardiology Input for Myoview Read and further determination for disposition. Likely to be D/C'd Home.   Hypertension  -C/w Losartan/HCTZ 50/12.5 mg po Daily  Hyperlipidemia -Lipid Panel showed Cholesterol of 170, TG of 171, HDL of 58, LDL of 78, and VLDL of 34 -C/w  Simvastatin 20 mg Daily  Diet Controlled Type II Diabetes Mellitus -Repeat HbA1c was 6.1 -Continue CBG Monitoring -Gave Patient Heart Healthy/Carb Modified Diet  Chronic LLQ pain in the setting of diverticulosis -Improving. Will need outpatient GI evaluation.  History of Bell's Palsy x2 and Parkinson's Disease -Continue Carbidopa-Levodopa 3 tablet po TID -C/w Gabapentin 100 mg po TID  Anxiety -Continue home Xanax 0.5 mg po BID  Depression -Continue Mirtazapine 30 mg po qHS and Fluoxetine 20 mg po Daily  DVT prophylaxis: Lovenox Code Status: DNR Family Communication: No Family at Bedside Disposition Plan: Pending Cardiologist Review of Myoview Findings to further determine disposition.  Consultants:  Cardiology - Dr. Fransico Him  Procedures: Myoview Stress Test in AM  Antimicrobials:  None  Subjective: Seen and examined and stated CP resolved. No N/V or Lightheadedness or Dizziness. Stated   Objective: Vitals:   06/13/16 0945 06/13/16 0946 06/13/16 0947 06/13/16 1335  BP: 135/75 133/76  (!) 118/41  Pulse: 82 88 80 69  Resp:    18  Temp:    97.6 F (36.4 C)  TempSrc:    Oral  SpO2:    99%  Weight:      Height:       No intake or output data in the 24 hours ending 06/13/16 1808 Filed Weights   06/11/16 0601 06/11/16 1605  Weight: 72.6 kg (160 lb) 71.2 kg (156 lb 14.4 oz)   Examination: Physical Exam:  Constitutional: WN/WD, NAD and appears calm and comfortable Eyes: Lids and conjunctivae normal, sclerae anicteric  ENMT: External Ears, Nose appear normal. Grossly normal hearing. Mucous membranes are moist.  Neck: Appears normal, supple, no cervical masses, normal ROM, no appreciable thyromegaly, no JVD Respiratory: Clear to auscultation bilaterally, no wheezing, rales, rhonchi or crackles. Normal respiratory effort and patient is not tachypenic. No accessory muscle use.  Cardiovascular: RRR, no murmurs / rubs / gallops. S1 and S2 auscultated. No extremity  edema. Abdomen: Soft, Tender to palpate in Left Lower Qudrant, Distended 2/2 to Body Habitus. No masses palpated. No appreciable hepatosplenomegaly. Bowel sounds positive x4.  GU: Deferred. Musculoskeletal: No clubbing / cyanosis of digits/nails. No joint deformity upper and lower extremities. No contractures. Normal strength and muscle tone.  Skin: No rashes, lesions, ulcers. No induration; Warm and dry.  Neurologic: CN 2-12 grossly intact with no focal deficits. Sensation intact in all 4 Extremities, Romberg sign cerebellar reflexes not assessed.  Psychiatric: Normal judgment and insight. Alert and oriented x 3. Normal mood and appropriate affect.   Data Reviewed: I have personally reviewed following labs and imaging studies  CBC:  Recent Labs Lab 06/11/16 0613 06/13/16 0326  WBC 8.2 8.2  NEUTROABS 4.8 4.9  HGB 13.1 12.6  HCT 39.8 39.1  MCV 96.1 96.1  PLT 218 0000000   Basic Metabolic Panel:  Recent Labs Lab 06/11/16 0613 06/13/16 0326  NA 140 139  K 3.9 3.5  CL 101 105  CO2 30 27  GLUCOSE 120* 116*  BUN 20 16  CREATININE 0.77 0.68  CALCIUM 10.1 9.1  MG  --  1.9  PHOS  --  3.6   GFR:  Estimated Creatinine Clearance: 58.2 mL/min (by C-G formula based on SCr of 0.68 mg/dL). Liver Function Tests:  Recent Labs Lab 06/13/16 0326  AST 16  ALT <5*  ALKPHOS 62  BILITOT 0.3  PROT 5.4*  ALBUMIN 3.1*   No results for input(s): LIPASE, AMYLASE in the last 168 hours. No results for input(s): AMMONIA in the last 168 hours. Coagulation Profile: No results for input(s): INR, PROTIME in the last 168 hours. Cardiac Enzymes:  Recent Labs Lab 06/11/16 0613 06/11/16 0841 06/11/16 1111 06/11/16 1428  TROPONINI <0.03 <0.03 <0.03 <0.03   BNP (last 3 results) No results for input(s): PROBNP in the last 8760 hours. HbA1C:  Recent Labs  06/11/16 0841  HGBA1C 6.1*   CBG:  Recent Labs Lab 06/12/16 1647 06/12/16 2058 06/13/16 0611 06/13/16 1122 06/13/16 1653    GLUCAP 84 134* 75 119* 95   Lipid Profile:  Recent Labs  06/11/16 0841  CHOL 170  HDL 58  LDLCALC 78  TRIG 171*  CHOLHDL 2.9   Thyroid Function Tests: No results for input(s): TSH, T4TOTAL, FREET4, T3FREE, THYROIDAB in the last 72 hours. Anemia Panel: No results for input(s): VITAMINB12, FOLATE, FERRITIN, TIBC, IRON, RETICCTPCT in the last 72 hours. Sepsis Labs: No results for input(s): PROCALCITON, LATICACIDVEN in the last 168 hours.  No results found for this or any previous visit (from the past 240 hour(s)).   Radiology Studies: Nm Myocar Multi W/spect W/wall Motion / Ef  Result Date: 06/13/2016 CLINICAL DATA:  70 year old female with chest pain. History of diabetes, hypertension and family history of hyperlipidemia. EXAM: MYOCARDIAL IMAGING WITH SPECT (REST AND PHARMACOLOGIC-STRESS) GATED LEFT VENTRICULAR WALL MOTION STUDY LEFT VENTRICULAR EJECTION FRACTION TECHNIQUE: Standard myocardial SPECT imaging was performed after resting intravenous injection of 10 mCi Tc-58m tetrofosmin. Subsequently, intravenous infusion of Lexiscan was performed under the supervision of the Cardiology staff. At peak effect of the drug, 30 mCi Tc-41m tetrofosmin was injected intravenously and standard myocardial SPECT imaging was performed. Quantitative gated imaging was also performed to evaluate left ventricular wall motion, and estimate left ventricular ejection fraction. COMPARISON:  None. FINDINGS: Perfusion: A medium sized area of decreased activity within the inferior wall of left ventricle on stress images is noted. There is a moderate to large amount of activity within bowel/gallbladder in the upper abdomen on stress images only. This area of decreased activity within the inferior left ventricular wall is felt to be artifactual from correction. No other areas of inducible ischemia or infarction identified within the left ventricle. Wall Motion: No left ventricular dilatation. Mild septal hypokinesis  noted. Left Ventricular Ejection Fraction: 99991111 % End diastolic volume 48 ml End systolic volume 16 ml IMPRESSION: 1. Medium sized area of decreased activity within the inferior wall on stress images as compared to rest - most likely secondary to correction from large amount of bowel/gallbladder activity on stress images only. Inducible ischemia is considered much less likely. 2. Mild septal hypokinesis. 3. Left ventricular ejection fraction 67% 4. Non invasive risk stratification*: Low *2012 Appropriate Use Criteria for Coronary Revascularization Focused Update: J Am Coll Cardiol. [id number]. [email or web address]://[email or web address] Electronically Signed   By: Margarette Canada M.D.   On: 06/13/2016 12:37   ECHOCARDIOGRAM Study Conclusions  - Left ventricle: The cavity size was normal. Wall thickness was   increased in a pattern of moderate LVH. Systolic function was   normal. The estimated ejection fraction was in the range of 60%   to 65%. LV mid-ventricle false tendon. Wall motion was normal;  there were no regional wall motion abnormalities. Doppler   parameters are consistent with abnormal left ventricular   relaxation (grade 1 diastolic dysfunction). The E/e&' ratio is   >15, suggesting elevated LV filling pressure. - Mitral valve: Calcified annulus. There was trivial regurgitation. - Left atrium: The atrium was normal in size. - Inferior vena cava: The vessel was normal in size. The   respirophasic diameter changes were in the normal range (>= 50%),   consistent with normal central venous pressure.  Impressions:  - Compared to a prior study in 2013, there is now moderate LVH with   grade 1 DD and elevated LV filling pressures.  Scheduled Meds: . regadenoson      . ALPRAZolam  0.5 mg Oral BID  . aspirin EC  81 mg Oral Daily  . carbidopa-levodopa  3 tablet Oral TID  . enoxaparin (LOVENOX) injection  40 mg Subcutaneous Q24H  . FLUoxetine  20 mg Oral Daily    . gabapentin  100 mg Oral TID  . losartan  50 mg Oral Daily   And  . hydrochlorothiazide  12.5 mg Oral Daily  . mirtazapine  30 mg Oral QHS  . simvastatin  20 mg Oral q1800   Continuous Infusions:   LOS: 0 days   Kerney Elbe, DO [facility] Pager [phone]  If 7PM-7AM, please contact night-coverage [email or web address] Password [email or web address] 06/13/2016, 6:08 PM

## 2016-06-13 NOTE — Progress Notes (Signed)
   Lexiscan Myoview  Pharmacologic stress administered. Images pending. See final report later today. Signed,  Richardson Dopp, PA-C   06/13/2016 9:43 AM

## 2016-06-13 NOTE — Progress Notes (Signed)
Nuclear stress test read as low risk.  There is a medium sized area of decreased activity in the inferior wall on stress that is more prominent than on rest but felt to be secondary to correction from large amount of bowel activity on stress images only.  Ischemia felt to be unlikely.  No further workup at this time. If she has recurrent CP then will need to consider cath. OK for discharge home and followup with with Cards extender in 1-2 weeks

## 2016-06-14 DIAGNOSIS — M79602 Pain in left arm: Secondary | ICD-10-CM | POA: Diagnosis not present

## 2016-06-14 DIAGNOSIS — R072 Precordial pain: Secondary | ICD-10-CM | POA: Diagnosis not present

## 2016-06-14 DIAGNOSIS — R438 Other disturbances of smell and taste: Secondary | ICD-10-CM | POA: Diagnosis not present

## 2016-06-14 DIAGNOSIS — I10 Essential (primary) hypertension: Secondary | ICD-10-CM | POA: Diagnosis not present

## 2016-06-14 DIAGNOSIS — R079 Chest pain, unspecified: Secondary | ICD-10-CM | POA: Diagnosis not present

## 2016-06-14 DIAGNOSIS — K219 Gastro-esophageal reflux disease without esophagitis: Secondary | ICD-10-CM | POA: Diagnosis not present

## 2016-06-14 DIAGNOSIS — G510 Bell's palsy: Secondary | ICD-10-CM | POA: Diagnosis not present

## 2016-06-14 DIAGNOSIS — F341 Dysthymic disorder: Secondary | ICD-10-CM | POA: Diagnosis not present

## 2016-06-14 DIAGNOSIS — K5790 Diverticulosis of intestine, part unspecified, without perforation or abscess without bleeding: Secondary | ICD-10-CM | POA: Diagnosis not present

## 2016-06-14 DIAGNOSIS — E119 Type 2 diabetes mellitus without complications: Secondary | ICD-10-CM | POA: Diagnosis not present

## 2016-06-14 LAB — GLUCOSE, CAPILLARY: Glucose-Capillary: 93 mg/dL (ref 65–99)

## 2016-06-14 NOTE — Discharge Summary (Signed)
Physician Discharge Summary  [name] [id number] DOB: 1945-11-08 DOA: 06/11/2016  PCP: Arnette Norris, MD  Admit date: 06/11/2016 Discharge date: 06/14/2016  Admitted From: Home Disposition: Home  Recommendations for Outpatient Follow-up:  1. Follow up with PCP Dr. Deborra Medina 1 week 2. Follow up with Cardiology [facility] in 1-2 weeks 3. Please obtain BMP/CBC in one week  Home Health: No Equipment/Devices: None  Discharge Condition: Stable CODE STATUS: FULL Diet recommendation: Heart Healthy  Brief/Interim Summary: [name] a 70 y.o.femaleWith a histrory of HLD, HTN, DVT, Syncope, DM 2, anxiety, depression, GERD presenting with substernal chest "tightness"since 4am, waking herup from sleep, accompanied by radiation to the left arm, described as 8/10 initially. Pain was described as sharp and heavy.Pain notworsened with deep inspiration, movement or exertion. Patient took ASA 324 mg on arrival, and then given 2 NTG with some relief 4/10, currently at 2/10. Had no furtherepisodes since admission. Denies any dizziness or falls. No syncope or presyncope on admission. Last Cardiac cath was on 2004 with 25% LAD stenosis. No recent long distance trips. Denies any new stressors. No new meds. Not on hormonal therapy. No new herbal supplements. No tobacco . No ETOH or recreational drugs. Denies shortness of breath or cough. Denies anysubjectivefever or chills. Denies any nausea, vomiting . She has chronic LLQ pain in the setting of diverticulosis She had intermittent loose stools 3 days prior to admission likely due to flare of diverticulosis, after eating irritants including tomatoes and seeds, but no episodes of diarrhea at the ED.Appetite is normal .Denies any leg swelling or calf pain. Denies any headaches or vision changes. Denies any seizures.No confusion reported. She has a history of Bell's Palsy and Parkinson's. She was admitted for a Chest Pain r/o and Cardiology  was consulted and she will underwent Myoview Stress test yesterday which showed Medium sized area of decreased activity within the inferior wall on stress images as compared to rest - most likely secondary to correction from large amount of bowel/gallbladder activity on stress images only. Inducible ischemia is considered much less likely. Cardiology evaluated and felt ischemia was unlikely and recommended no further workup at this time. She was told if she had recurrent CP she would need a Catheterization. At this time Patient was stable for D/C and will follow up with PCP and with Cardiology within 1-2 weeks.   Discharge Diagnoses:  Active Problems:   HYPERLIPIDEMIA, MIXED   OBESITY, MORBID   ANXIETY DEPRESSION   HYPERTENSION, BENIGN ESSENTIAL   Loss of perception for taste   Loss, sense of, smell   Bell's palsy   DNR (do not resuscitate)   History of recurrent UTIs   Diabetes mellitus type 2, diet-controlled (HCC)   GERD (gastroesophageal reflux disease)   Chest pain at rest  Chest Pain r/o'd ACS -Patients Troponin's <0.03 x4 -HEART score 4.  -EKG without evidence of ACS.  -CP relieved by Nitroglycerin, Aspirin.  -Last cath in 2004 with 25% LAD stenosis and has not had a recent cardiology follow up.  -RF include DM, HTN, HLD, h/o DVT -[facility] Consulted for further Recc's and Evaluation -Myoview Stress Test showed Medium sized area of decreased activity within the inferior wall on stress images as compared to rest - most likely secondary to correction from large amount of bowel/gallbladder activity on stress images only. Inducible ischemia is considered much less likely. Mild septal hypokinesis was also seen. Left ventricular ejection fraction 67%. Per reading Cardiologist - Non invasive risk stratification*: Low -  Echocardiogram showed Compared to a prior study in 2013, there is now moderate LVH with grade 1 DD and elevated LV filling pressures. -C/w ASA 81 mg,  Simvastatin 20 mg -Lipid Panel showed Cholesterol of 170, TG of 171, HDL of 58, LDL of 78, and VLDL of 34 -Last HbA1c was 6.2 in 11/2015; Repeat was 6.1 -Follow up with Cardiology [facility] in 1-2 weeks and follow up with PCP within 1 week -If Chest Pain recurrent will likely need a Cath.   Hypertension  -C/w Losartan/HCTZ 50/12.5 mg po Daily  Hyperlipidemia -Lipid Panel showed Cholesterol of 170, TG of 171, HDL of 58, LDL of 78, and VLDL of 34 -C/w Simvastatin 20 mg Daily  Diet Controlled Type II Diabetes Mellitus -Repeat HbA1c was 6.1 -Continue CBG Monitoring -Gave Patient Heart Healthy/Carb Modified Diet  Chronic LLQ painin the setting of diverticulosis -Improving. Will need outpatient GI evaluation.  History of Bell's Palsy x2 and Parkinson's Disease -Continue Carbidopa-Levodopa 3 tablet po TID -C/w Gabapentin 100 mg po TID  Anxiety -Continue home Xanax 0.5 mg po BID  Depression -Continue Mirtazapine 30 mg po qHS and Fluoxetine 20 mg po Daily  Discharge Instructions  Discharge Instructions    Call MD for:  difficulty breathing, headache or visual disturbances    Complete by:  As directed    Call MD for:  persistant dizziness or light-headedness    Complete by:  As directed    Call MD for:  persistant nausea and vomiting    Complete by:  As directed    Call MD for:  severe uncontrolled pain    Complete by:  As directed    Diet - low sodium heart healthy    Complete by:  As directed    Discharge instructions    Complete by:  As directed    Folllow up with PCP and with Cardiology in 1-2 weeks. Take all medications as prescribed. If symptoms worsen or change please return to PCP or ER for evaluation.   Increase activity slowly    Complete by:  As directed        Medication List    TAKE these medications   ALPRAZolam 0.5 MG tablet Commonly known as:  XANAX TAKE 1 TABLET BY MOUTH TWICE A DAY   aspirin 81 MG tablet Take 81 mg by mouth daily.   CALCIUM  + D PO Take 600 mg by mouth daily.   carbidopa-levodopa 25-100 MG tablet Commonly known as:  SINEMET IR TAKE 3 TABLETS BY MOUTH 3 TIMES A DAY   CENTRUM SILVER ADULT 50+ Tabs Take by mouth.   feeding supplement (ENSURE COMPLETE) Liqd Take 237 mLs by mouth 2 (two) times daily between meals.   fexofenadine 180 MG tablet Commonly known as:  ALLEGRA Take 180 mg by mouth daily.   FLUoxetine 20 MG capsule Commonly known as:  PROZAC TAKE ONE CAPSULE BY MOUTH EVERY DAY   gabapentin 100 MG capsule Commonly known as:  NEURONTIN TAKE 1 CAPSULE (100 MG TOTAL) BY MOUTH 3 (THREE) TIMES DAILY.   GRAPE SEED PO Take 1 tablet by mouth daily.   losartan-hydrochlorothiazide 50-12.5 MG tablet Commonly known as:  HYZAAR TAKE 1 TABLET BY MOUTH DAILY   mirtazapine 30 MG tablet Commonly known as:  REMERON Take 1 tablet (30 mg total) by mouth at bedtime.   ranitidine 150 MG tablet Commonly known as:  ZANTAC Take 150 mg by mouth daily as needed for heartburn.   simvastatin 20 MG tablet Commonly known as:  ZOCOR TAKE 1 TABLET (20 MG TOTAL) BY MOUTH AT BEDTIME.   traMADol 50 MG tablet Commonly known as:  ULTRAM Take 1 tablet (50 mg total) by mouth every 6 (six) hours as needed.   vitamin B-12 1000 MCG tablet Commonly known as:  CYANOCOBALAMIN Take 1,000 mcg by mouth daily.   Vitamin D3 1000 units Caps Take 1 capsule by mouth daily.      Follow-up Information    Arnette Norris, MD. Call in 1 week(s).   Specialty:  Family Medicine Contact information: [address] [place] [address] [phone]        Fransico Him, MD. Schedule an appointment as soon as possible for a visit in 1 week(s).   Specialty:  Cardiology Why:  Follow up for Chest Pain r/o in 1-2 weeks.  Contact information: [id number] [place] [address] [phone]          Allergies  Allergen Reactions  . Codeine Sulfate     REACTION: hallucinations  . Penicillins Anaphylaxis,  Swelling and Rash  . Wellbutrin [Bupropion] Other (See Comments)    Seizures and hallucinations    Consultations:  Cardiology  Procedures/Studies: Dg Chest 2 View  Result Date: 06/11/2016 CLINICAL DATA:  Chest and left arm pain beginning last night. EXAM: CHEST  2 VIEW COMPARISON:  06/21/2012 FINDINGS: The heart size and mediastinal contours are within normal limits. Aortic atherosclerosis. Both lungs are clear. No evidence of pleural effusion or pneumothorax. The visualized skeletal structures are unremarkable. IMPRESSION: No active cardiopulmonary disease. Aortic atherosclerosis. Electronically Signed   By: Earle Gell M.D.   On: 06/11/2016 07:39   Nm Myocar Multi W/spect W/wall Motion / Ef  Result Date: 06/13/2016 CLINICAL DATA:  71 year old female with chest pain. History of diabetes, hypertension and family history of hyperlipidemia. EXAM: MYOCARDIAL IMAGING WITH SPECT (REST AND PHARMACOLOGIC-STRESS) GATED LEFT VENTRICULAR WALL MOTION STUDY LEFT VENTRICULAR EJECTION FRACTION TECHNIQUE: Standard myocardial SPECT imaging was performed after resting intravenous injection of 10 mCi Tc-3m tetrofosmin. Subsequently, intravenous infusion of Lexiscan was performed under the supervision of the Cardiology staff. At peak effect of the drug, 30 mCi Tc-6m tetrofosmin was injected intravenously and standard myocardial SPECT imaging was performed. Quantitative gated imaging was also performed to evaluate left ventricular wall motion, and estimate left ventricular ejection fraction. COMPARISON:  None. FINDINGS: Perfusion: A medium sized area of decreased activity within the inferior wall of left ventricle on stress images is noted. There is a moderate to large amount of activity within bowel/gallbladder in the upper abdomen on stress images only. This area of decreased activity within the inferior left ventricular wall is felt to be artifactual from correction. No other areas of inducible ischemia or  infarction identified within the left ventricle. Wall Motion: No left ventricular dilatation. Mild septal hypokinesis noted. Left Ventricular Ejection Fraction: [id number] % End diastolic volume 48 ml End systolic volume 16 ml IMPRESSION: 1. Medium sized area of decreased activity within the inferior wall on stress images as compared to rest - most likely secondary to correction from large amount of bowel/gallbladder activity on stress images only. Inducible ischemia is considered much less likely. 2. Mild septal hypokinesis. 3. Left ventricular ejection fraction 67% 4. Non invasive risk stratification*: Low *2012 Appropriate Use Criteria for Coronary Revascularization Focused Update: J Am Coll Cardiol. [id number]. [email or web address] Electronically Signed   By: Margarette Canada M.D.   On: 06/13/2016 12:37   ECHOCARDIOGRAM Study Conclusions  - Left ventricle: The cavity size was  normal. Wall thickness was increased in a pattern of moderate LVH. Systolic function was normal. The estimated ejection fraction was in the range of 60% to 65%. LV mid-ventricle false tendon. Wall motion was normal; there were no regional wall motion abnormalities. Doppler parameters are consistent with abnormal left ventricular relaxation (grade 1 diastolic dysfunction). The E/e&' ratio is >15, suggesting elevated LV filling pressure. - Mitral valve: Calcified annulus. There was trivial regurgitation. - Left atrium: The atrium was normal in size. - Inferior vena cava: The vessel was normal in size. The respirophasic diameter changes were in the normal range (>= 50%), consistent with normal central venous pressure.  Impressions:  - Compared to a prior study in 2013, there is now moderate LVH with grade 1 DD and elevated LV filling pressures.  Subjective: Seen and examined at bedside and doing well. No complaints and no Cp. Ready to go home.   Discharge  Exam: Vitals:   06/13/16 2100 06/14/16 0546  BP: 128/72 (!) 120/54  Pulse: 72 67  Resp: 18 18  Temp: 97.8 F (36.6 C) 97.5 F (36.4 C)   Vitals:   06/13/16 0947 06/13/16 1335 06/13/16 2100 06/14/16 0546  BP:  (!) 118/41 128/72 (!) 120/54  Pulse: 80 69 72 67  Resp:  18 18 18   Temp:  97.6 F (36.4 C) 97.8 F (36.6 C) 97.5 F (36.4 C)  TempSrc:  Oral Oral Oral  SpO2:  99% 99% 98%  Weight:      Height:       General: Pt is alert, awake, not in acute distress Cardiovascular: RRR, S1/S2 +, no rubs, no gallops Respiratory: CTA bilaterally, no wheezing, no rhonchi Abdominal: Soft, NT, ND, bowel sounds + Extremities: no edema, no cyanosis Neuro: Chronic facial Droop on Left from Bells Palsy  The results of significant diagnostics from this hospitalization (including imaging, microbiology, ancillary and laboratory) are listed below for reference.    Microbiology: No results found for this or any previous visit (from the past 240 hour(s)).   Labs: BNP (last 3 results) No results for input(s): BNP in the last 8760 hours. Basic Metabolic Panel:  Recent Labs Lab 06/11/16 0613 06/13/16 0326  NA 140 139  K 3.9 3.5  CL 101 105  CO2 30 27  GLUCOSE 120* 116*  BUN 20 16  CREATININE 0.77 0.68  CALCIUM 10.1 9.1  MG  --  1.9  PHOS  --  3.6   Liver Function Tests:  Recent Labs Lab 06/13/16 0326  AST 16  ALT <5*  ALKPHOS 62  BILITOT 0.3  PROT 5.4*  ALBUMIN 3.1*   No results for input(s): LIPASE, AMYLASE in the last 168 hours. No results for input(s): AMMONIA in the last 168 hours. CBC:  Recent Labs Lab 06/11/16 0613 06/13/16 0326  WBC 8.2 8.2  NEUTROABS 4.8 4.9  HGB 13.1 12.6  HCT 39.8 39.1  MCV 96.1 96.1  PLT 218 204   Cardiac Enzymes:  Recent Labs Lab 06/11/16 0613 06/11/16 0841 06/11/16 1111 06/11/16 1428  TROPONINI <0.03 <0.03 <0.03 <0.03   BNP: Invalid input(s): POCBNP CBG:  Recent Labs Lab 06/13/16 0611 06/13/16 1122 06/13/16 1653  06/13/16 2114 06/14/16 0639  GLUCAP 75 119* 95 94 93   D-Dimer No results for input(s): DDIMER in the last 72 hours. Hgb A1c No results for input(s): HGBA1C in the last 72 hours. Lipid Profile No results for input(s): CHOL, HDL, LDLCALC, TRIG, CHOLHDL, LDLDIRECT in the last 72 hours. Thyroid function studies No  results for input(s): TSH, T4TOTAL, T3FREE, THYROIDAB in the last 72 hours.  Invalid input(s): FREET3 Anemia work up No results for input(s): VITAMINB12, FOLATE, FERRITIN, TIBC, IRON, RETICCTPCT in the last 72 hours. Urinalysis    Component Value Date/Time   COLORURINE YELLOW 03/23/2016 1350   APPEARANCEUR CLOUDY (A) 03/23/2016 1350   LABSPEC 1.024 03/23/2016 1350   PHURINE 7.5 03/23/2016 1350   GLUCOSEU NEGATIVE 03/23/2016 1350   HGBUR NEGATIVE 03/23/2016 1350   BILIRUBINUR neg 04/22/2016 1150   KETONESUR 15 (A) 03/23/2016 1350   PROTEINUR neg 04/22/2016 1150   PROTEINUR NEGATIVE 03/23/2016 1350   UROBILINOGEN 0.2 04/22/2016 1150   UROBILINOGEN 0.2 04/04/2012 0200   NITRITE neg 04/22/2016 1150   NITRITE NEGATIVE 03/23/2016 1350   LEUKOCYTESUR Negative 04/22/2016 1150   Sepsis Labs Invalid input(s): PROCALCITONIN,  WBC,  LACTICIDVEN Microbiology No results found for this or any previous visit (from the past 240 hour(s)).  Time coordinating discharge: Less than 30 minutes  SIGNED:  Kerney Elbe, DO [facility] 06/14/2016, 6:59 PM Pager [phone]  If 7PM-7AM, please contact night-coverage [email or web address] Password [email or web address]

## 2016-06-14 NOTE — Progress Notes (Signed)
06/14/2016 10:43 AM Discharge AVS meds taken today and those due this evening reviewed.  Follow-up appointments and when to call md reviewed.  D/C IV and TELE.  Questions and concerns addressed.   D/C home per orders. [place]

## 2016-06-15 ENCOUNTER — Other Ambulatory Visit: Payer: Self-pay | Admitting: Family Medicine

## 2016-06-16 ENCOUNTER — Encounter: Payer: Self-pay | Admitting: Family Medicine

## 2016-06-16 ENCOUNTER — Ambulatory Visit (INDEPENDENT_AMBULATORY_CARE_PROVIDER_SITE_OTHER): Payer: Medicare Other | Admitting: Family Medicine

## 2016-06-16 VITALS — BP 126/74 | HR 95 | Temp 98.1°F | Wt 157.8 lb

## 2016-06-16 DIAGNOSIS — R079 Chest pain, unspecified: Secondary | ICD-10-CM

## 2016-06-16 MED ORDER — ALPRAZOLAM 0.5 MG PO TABS: 0.5000 mg | ORAL_TABLET | Freq: Two times a day (BID) | ORAL | 0 refills | Status: DC

## 2016-06-16 NOTE — Progress Notes (Signed)
Pre visit review using our clinic review tool, if applicable. No additional management support is needed unless otherwise documented below in the visit note. 

## 2016-06-16 NOTE — Progress Notes (Signed)
Subjective:   Patient ID: [name], female    DOB: July 21, 1946, 70 y.o.   MRN: [id number]  [name] is a pleasant 70 y.o. year old female with h/o HLD, HTN, DVT, DM, depression, GERD who presents to clinic today with Hospitalization Follow-up  on 06/16/2016  HPI: Admitted to [facility] 11/17- 06/14/16 Notes reviewed.  Presented to ER with tightness in chest that radiated down her left arm- initially 8/10. Was not worsened by movement or exertion or relieved by rest.  On arrival, given ASA and NTG and pain subsided.  Last cardiac cath was in 2004 with 25% LAD steonsis.  Admitted for CP rule out and cardiology was consulted- myoview done, cardiology felt ischemia unliikely and recommended no further inpatient work up.  Advised that she would likely need cardiac cath if symptoms return.  Dg Chest 2 View  Result Date: 06/11/2016 CLINICAL DATA:  Chest and left arm pain beginning last night. EXAM: CHEST  2 VIEW COMPARISON:  06/21/2012 FINDINGS: The heart size and mediastinal contours are within normal limits. Aortic atherosclerosis. Both lungs are clear. No evidence of pleural effusion or pneumothorax. The visualized skeletal structures are unremarkable. IMPRESSION: No active cardiopulmonary disease. Aortic atherosclerosis. Electronically Signed   By: Earle Gell M.D.   On: 06/11/2016 07:39   Nm Myocar Multi W/spect W/wall Motion / Ef  Result Date: 06/13/2016 CLINICAL DATA:  70 year old female with chest pain. History of diabetes, hypertension and family history of hyperlipidemia. EXAM: MYOCARDIAL IMAGING WITH SPECT (REST AND PHARMACOLOGIC-STRESS) GATED LEFT VENTRICULAR WALL MOTION STUDY LEFT VENTRICULAR EJECTION FRACTION TECHNIQUE: Standard myocardial SPECT imaging was performed after resting intravenous injection of 10 mCi Tc-64m tetrofosmin. Subsequently, intravenous infusion of Lexiscan was performed under the supervision of the Cardiology staff. At peak effect of the drug, 30 mCi  Tc-4m tetrofosmin was injected intravenously and standard myocardial SPECT imaging was performed. Quantitative gated imaging was also performed to evaluate left ventricular wall motion, and estimate left ventricular ejection fraction. COMPARISON:  None. FINDINGS: Perfusion: A medium sized area of decreased activity within the inferior wall of left ventricle on stress images is noted. There is a moderate to large amount of activity within bowel/gallbladder in the upper abdomen on stress images only. This area of decreased activity within the inferior left ventricular wall is felt to be artifactual from correction. No other areas of inducible ischemia or infarction identified within the left ventricle. Wall Motion: No left ventricular dilatation. Mild septal hypokinesis noted. Left Ventricular Ejection Fraction: 99991111 % End diastolic volume 48 ml End systolic volume 16 ml IMPRESSION: 1. Medium sized area of decreased activity within the inferior wall on stress images as compared to rest - most likely secondary to correction from large amount of bowel/gallbladder activity on stress images only. Inducible ischemia is considered much less likely. 2. Mild septal hypokinesis. 3. Left ventricular ejection fraction 67% 4. Non invasive risk stratification*: Low *2012 Appropriate Use Criteria for Coronary Revascularization Focused Update: J Am Coll Cardiol. [id number]. [email or web address] Electronically Signed   By: Margarette Canada M.D.   On: 06/13/2016 12:37    Current Outpatient Prescriptions on File Prior to Visit  Medication Sig Dispense Refill  . ALPRAZolam (XANAX) 0.5 MG tablet TAKE 1 TABLET BY MOUTH TWICE A DAY 60 tablet 0  . aspirin 81 MG tablet Take 81 mg by mouth daily.     Marland Kitchen Bioflavonoid Products (GRAPE SEED PO) Take 1 tablet by mouth daily.     . Calcium Carbonate-Vitamin D (CALCIUM +  D PO) Take 600 mg by mouth daily.    . carbidopa-levodopa (SINEMET IR) 25-100  MG tablet TAKE 3 TABLETS BY MOUTH 3 TIMES A DAY 810 tablet 0  . Cholecalciferol (VITAMIN D3) 1000 UNITS CAPS Take 1 capsule by mouth daily.      . feeding supplement (ENSURE COMPLETE) LIQD Take 237 mLs by mouth 2 (two) times daily between meals. 1 Bottle 11  . fexofenadine (ALLEGRA) 180 MG tablet Take 180 mg by mouth daily.    Marland Kitchen FLUoxetine (PROZAC) 20 MG capsule TAKE ONE CAPSULE BY MOUTH EVERY DAY 90 capsule 1  . gabapentin (NEURONTIN) 100 MG capsule TAKE 1 CAPSULE (100 MG TOTAL) BY MOUTH 3 (THREE) TIMES DAILY. 270 capsule 1  . losartan-hydrochlorothiazide (HYZAAR) 50-12.5 MG tablet TAKE 1 TABLET BY MOUTH DAILY 90 tablet 3  . mirtazapine (REMERON) 30 MG tablet Take 1 tablet (30 mg total) by mouth at bedtime. 90 tablet 0  . Multiple Vitamins-Minerals (CENTRUM SILVER ADULT 50+) TABS Take by mouth.    . ranitidine (ZANTAC) 150 MG tablet Take 150 mg by mouth daily as needed for heartburn.    . simvastatin (ZOCOR) 20 MG tablet TAKE 1 TABLET (20 MG TOTAL) BY MOUTH AT BEDTIME. 90 tablet 3  . traMADol (ULTRAM) 50 MG tablet Take 1 tablet (50 mg total) by mouth every 6 (six) hours as needed. 15 tablet 0  . vitamin B-12 (CYANOCOBALAMIN) 1000 MCG tablet Take 1,000 mcg by mouth daily.       No current facility-administered medications on file prior to visit.     Allergies  Allergen Reactions  . Codeine Sulfate     REACTION: hallucinations  . Penicillins Anaphylaxis, Swelling and Rash  . Wellbutrin [Bupropion] Other (See Comments)    Seizures and hallucinations    Past Medical History:  Diagnosis Date  . Adjustment disorder with depressed mood   . Anxiety   . Bell's palsy    1985  . DEMENTIA   . Depressive disorder, not elsewhere classified   . 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 DVT (deep venous thrombosis), left "early 2000's"   RLE  . Dysmetabolic syndrome X   . Essential hypertension, benign   . [id number]) 05/2011   "pretty often since they put me  on Parkinson's medicine"  . History of shingles    "as a teen and in my 68's"  . Mixed hyperlipidemia   . Morbid obesity ([place])   . Neurodegenerative gait disorder   . Parkinson disease [facility])    "they think that's what it is"  . Personal history of thrombophlebitis   . Syncope and collapse 12/06/11   "this am; I pass out fairly often"  . Type II or unspecified type diabetes mellitus without mention of complication, not stated as uncontrolled 12/06/11   "not anymore; had lap band OR"    Past Surgical History:  Procedure Laterality Date  . bladder tack  11/1995  . BREAST BIOPSY  05/20/2000   bilaterally  . BREAST CYST ASPIRATION  12/1998   bilaterally  . CARDIAC CATHETERIZATION  2004  . CATARACT EXTRACTION W/ INTRAOCULAR LENS  IMPLANT, BILATERAL    . FRACTURE SURGERY  02/09/2006   bilateral elbows  . FRACTURE SURGERY     right knee  . FRACTURE SURGERY     tib plateau  . KNEE ARTHROSCOPY  12/12/2002   right   . LAPAROSCOPIC GASTRIC BANDING  2008  . SEPTOPLASTY  08/1998  with antral window   . TONSILLECTOMY AND ADENOIDECTOMY  1970  . TUBAL LIGATION  1970's    Family History  Problem Relation Age of Onset  . Asthma Mother   . COPD Father   . Heart disease Father   . Diabetes Sister   . Vision loss Sister     legally blind    Social History   Social History  . Marital status: Widowed    Spouse name: N/A  . Number of children: 2  . Years of education: N/A   Occupational History  . 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 Smoking status: Never Smoker  . Smokeless tobacco: Never Used  . Alcohol use No     Comment: none  . Drug use: No  . Sexual activity: No   Other Topics Concern  . Not on file   Social History Narrative   Lives with daughter and granddaughter.   Daughter is HPOA.   DNR.   The PMH, PSH, Social History, Family History, Medications, and allergies have been reviewed in [facility], and have been updated if  relevant.  Review of Systems  Constitutional: Negative.   HENT: Negative.   Respiratory: Negative.   Cardiovascular: Negative.   Gastrointestinal: Negative.   Endocrine: Negative.   Genitourinary: Negative.   Musculoskeletal: Negative.   Allergic/Immunologic: Negative.   Neurological: Negative.   Hematological: Negative.   Psychiatric/Behavioral: Negative.   All other systems reviewed and are negative.      Objective:    BP 126/74   Pulse 95   Temp 98.1 F (36.7 C) (Oral)   Wt 157 lb 12 oz (71.6 kg)   SpO2 95%   BMI 29.81 kg/m    Physical Exam  Constitutional: She is oriented to person, place, and time. She appears well-developed and well-nourished.  HENT:  Head: Normocephalic.  Neck: Neck supple.  Cardiovascular: Normal rate and regular rhythm.   Pulmonary/Chest: Effort normal and breath sounds normal.  Musculoskeletal: Normal range of motion.  Neurological: She is alert and oriented to person, place, and time. No cranial nerve deficit.  Skin: Skin is warm and dry.  Psychiatric: She has a normal mood and affect. Her behavior is normal. Judgment and thought content normal.          Assessment & Plan:   Chest pain at rest No Follow-up on file.

## 2016-06-16 NOTE — Assessment & Plan Note (Signed)
Well controlled. No changes made to rxs. 

## 2016-06-16 NOTE — Assessment & Plan Note (Signed)
Hospital notes and recommendations reviewed and discussed with pt and her daughter. No current CP. She is aware that she will need further cardiac evaluation if symptoms return.

## 2016-06-18 ENCOUNTER — Other Ambulatory Visit: Payer: Self-pay | Admitting: Family Medicine

## 2016-06-21 NOTE — Telephone Encounter (Signed)
Last f/u 03/2016

## 2016-06-22 NOTE — Telephone Encounter (Signed)
Rx called in to requested pharmacy 

## 2016-06-23 ENCOUNTER — Other Ambulatory Visit: Payer: Self-pay | Admitting: Neurology

## 2016-06-23 ENCOUNTER — Other Ambulatory Visit: Payer: Self-pay | Admitting: Family Medicine

## 2016-07-09 ENCOUNTER — Telehealth: Payer: Self-pay

## 2016-07-09 NOTE — Telephone Encounter (Signed)
Pt left v/m requesting handicap placard paperwork filled out. Handicap placard application in Dr Hulen Shouts in box.

## 2016-07-20 ENCOUNTER — Other Ambulatory Visit: Payer: Self-pay | Admitting: Family Medicine

## 2016-07-21 NOTE — Telephone Encounter (Signed)
last f/u 11/2015-CPE

## 2016-07-21 NOTE — Telephone Encounter (Signed)
Rx called in to requested pharmacy 

## 2016-08-08 ENCOUNTER — Other Ambulatory Visit: Payer: Self-pay | Admitting: Neurology

## 2016-08-19 ENCOUNTER — Other Ambulatory Visit: Payer: Self-pay | Admitting: Family Medicine

## 2016-08-20 NOTE — Telephone Encounter (Signed)
Last f/u 11/2015-CPE 

## 2016-08-20 NOTE — Telephone Encounter (Signed)
Rx called in to requested pharmacy 

## 2016-08-20 NOTE — Progress Notes (Signed)
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  This patient is accompanied in the office by her child who supplements the history.      She is on carbidopa/levodopa 25/100, 3 tablets 3 times per day.  She is doing well on this medication, without side effects.  She had one fall the day before Thanksgiving.  She was in a parking lot of a restaurant and tripped over something(curb).  She fx her nose and had to have her lip stitched up.    Since then, however, she has done very well.  Her daughter reports that she has been very stable on her feet.  Last visit, she was complaining of a decreased appetite and increased anxiety.  Therefore, I increased her Remeron to 30 mg at night.  This helped with both sleep and appetite and she is very pleased.  She also has a hx of seizure, likely secondary to Wellbutrin.  Last visit, her keppra was d/c.  She has been seizure free since that time.  However, mood and depression continue to be an issue.  I sent her to Dr. Cheryln Manly but she didn't follow up.  She states that she cannot talk to him without her daughter present.  Her biggest issue is that her granddaughter is a Ship broker and she lives with the patient.  Apparently, the majority of the house is clean and safe with the exception of the granddaughters room.  The patient states that the mess just drives her crazy and she cannot stop thinking about it.  Her granddaughter goes to community college and her leaving the house is not an option.  04/03/13 update:  [name].  She was supposed to f/u with Dr Cheryln Manly due to significant anxiety and depression and she cancelled her 3/4, 3/13 and 3/27 visits and never r/s.  She has not seen him since 10/13.  She states that she is just "not going to talk with someone."  She, however, is doing some better in that regard.  Much of her stress surrounds her granddaughter, who lives with  her and is a Ship broker.  The patient states that her granddaughter has now admitted to this, but to seek help.  They were able to get some of the room cleaned up a while ago and that seemed to really help the patient's mental state.  However, the room has reconnected and is now just as it previously was.  She also reports a significant amount of stress over her sister who has end-stage Alzheimer's dementia.  Her sister no longer recognizes her, and the patient realizes when she goes around her sister, it makes her symptoms much worse.  She feels that her memory is getting worse, but nothing like her sisters.  She also thinks that her speech is getting worse.    She is having some headaches, similar to what she had in the past, prior to starting on the Levodopa.  It is over the L forehead, but not in the eye.  It is pounding.  It is daily.  It does not cause n/v or cause photophobia or phonophobia.  It makes her feel "drained."  It does not prevent her from doing things.  There are no lateralizing paresthesias with the headaches.  There are no vision changes.  07/30/13 update:  The pt is on carbidopa/levodopa 25/100 three tablets three times per day.  She is doing well in that  regard.  She did not go to PT because of transportation issues.  She was started on zanaflex last visit for HA and was also started on the exelon patch.  Interestingly, she states that the exelon patch caused headaches but it was started after the headache.  Nonetheless, she stopped it and still has headache.  It starts in the L forehead and radiates over the L side of the head.  She still thinks that the patch made them worse however and also thinks that it caused UTI's.  She is still on zanaflex but isn't sure its helping.  No falls/hallucination/syncope/lightheadedness.  11/27/13 update:  Patient is following up today in the movement disorder clinic.  She is currently on carbidopa/levodopa 25/100, 3 tablets by mouth 3 times per day.  Since our  last visit, I did get a note from Dr. Domingo Cocking at the headache clinic.  He started the patient on Indocin 25 mg 3 times a day and she was told to titrate to 75 mg 3 times a day.  She states that she had SE with that and she was changed to gabapentin 100 mg and that is helping.  He also scheduled her for trigger point injections and an MRI brain.  She did not care for the trigger point injections although they did help.  She has f/u in June.  Walking seems to have been a little slower, but no falls.  Her daughter is with her today and feels that walking has been more tenuous.   No hallucinations.  No diplopia but is having blurry vision.  Does have vision appt in [facility] in few weeks.  05/07/14 update:   Patient is following up today in the movement disorder clinic.  She is currently on carbidopa/levodopa 25/100, 3 tablets by mouth 3 times per day.  Daughter states that she often forgets middle of the day dose but pt denies that.  She takes it at 9-10 am on awakening, 3 pm and bedtime.  She and her daughter both think that it is helpful.   No diplopia.  No falls.  She saw Dr. Domingo Cocking in June and I got a note from him.  She had more trigger point injections.  She is doing really well from a headache standpoint.  She doesn't have to return to him for 6 months (saw him in September).  She continues on remeron for her depression and mood.  She states that she hates that she is confined to the home and depression is tied into that.  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  She then asks if her daughter can come back to the room for the discussion and she did.   She did do therapy with [facility] and she thought that it went well.   10/08/14 update:   Patient is following up today in the movement disorder clinic. She is accompanied by her daughter who supplements the hx.  She is currently on carbidopa/levodopa 25/100, 3 tablets  by mouth 3 times per day.  C/o memory loss.  States that her daughter will tell her to go to the kitchen to get things and she will get there and not be able to remember.  Her daughter thinks that she jumps subjects.  Also c/o feet paresthesias.  Worries that DM has "returned" as used to be diabetic but not anymore.  Last A1C in April was 6.0.  Also  states that right leg below the knee posteriorally she is having paresthesias.  Started after she bent over one day and the knee "popped."  No LBP.  On gabapentin but that is for headache, which is well controlled.  She has been headache free for a long time.  Received a note from Dr. Domingo Cocking recently and pt told to continue meds.    04/10/15 update:  The patient is following up today.  She is on carbidopa/levodopa 25/100, 3 tablets 3 times per day.  Because I was not convinced that the patient had a dementia but her previous neuropsych test years ago said that she did and because some of the patient's depression surrounds the fact that her daughter will not let her drive because of this potential diagnosis, we repeated neuropsych testing.  While there was evidence of cognitive change due to depression, she did not meet the criteria for the diagnosis of dementia.  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  It was recommended that she undergo psychiatric evaluation and treatment, but she refused.  She was again given a brochure for the driving evaluator in [place] to have a driving evaluation.  She reports that her depression was fairly well under control until the end of 2022/12/14 when her sister died and she has been appropriately grieving.  She did have an EMG done since last visit.  This was done on 11/07/2014 There was no evidence of a large fiber peripheral neuropathy, but there was evidence of a chronic, moderate right L2-L4 radiculopathy and a chronic, mild L5 radiculopathy on the right.  She fell one time getting out  of the chair as the right leg gave out.  She also rolled out of the bed at night one time.   She states that she was dreaming.   Fortunately, she did not get hurt with either of these.    10/20/15 update:  The patient follows up today, accompanied by her daughter who supplements the history.  She has a history of vascular parkinsonism and is currently on carbidopa/levodopa 25/100, 3 tablets 3 times per day.  Reports that she is a little "fidgety" and that she just can't stay still.  No new medications.  She also has a history of depression and is prescribed Remeron by her primary care physician.  Her depression mostly surrounds the fact that she cannot leave the house because her daughter will not let her drive.  Depression is improved as she has gotten some cats that keep her company.  We have talked previously about a driving evaluation, as I think she likely could drive from a neurologic standpoint.  Her daughter does not want her to pursue that.  No falls.   She has been headache free on gabapentin and doesn't want to change that  04/22/16 update:  The patient follows up today, accompanied by her daughter supplements the history.  She is on carbidopa/levodopa 25/100, 3 tablets 3 times per day.  She has had 2 falls.  With the first, she got up and made her bed, which she doesn't even remember but she does remember hitting the ground.  With the second one, her toilet was leaking and she fell over the wet towels on the floor and she hit her L head.  She went to the ER for this one, which was 8/29.  She has had a sore spot on the L occiput since. Was given tramadol but made her too sleepy. She had  a CT brain that day and I reviewed it.  It was nonacute and only WMD and mild atrophy.   She denies lightheadedness or near syncope.  She is on Remeron for her depression and feels that she has been stable in that regard.  Reports that "I talk to my cats while my family is working."   She has had no hallucinations.  She  is not particularly active with cardiovascular exercise.  Headaches well controlled on gabapentin 100 mg tid except sore occiput where she fell.  She also uses that for lumbar radiculopathy but reports that is better.    08/23/16 update:  The patient returns today, accompanied by her daughter who supplements the history.  She remains on carbidopa/levodopa 25/100, 3 tablets 3 times per day.  Overall, she feels that she has been a little shaky. Pt states that she fell one time; she was cleaning up a toilet that overflowed and tripped over towels (she told me about this one last time).  With another instance, she woke up and was totally dressed and making bed and had fallen.  She was awake when daughter went in but she doesn't remember if she was sleep walking or not.  Pt denies lightheadedness, near syncope.  No hallucinations.  Mood has been stable on remeron.  No SI/HI.  Has a chronic lumbar radiculopathy and hx of headache, well controlled on gabapentin 100 mg tid.  She has about 1 headache a week and lumbar radiculopathy is resolved.  She thinks that her short term memory hasn't been as good.  She will go into the kitchen and forget why she is there.  Long term memory is good.    Neuroimaging has previously been performed.   It was done on 09/13/2013 and I reviewed these films.  There was atrophy and moderate small vessel disease.  MRI brain,09/13/13  FINDINGS:  Advanced cerebral and cerebellar atrophy. Chronic microvascular  ischemic change affecting the periventricular and subcortical white  matter as well as the brainstem above moderate to advanced degree.  No acute stroke, acute hemorrhage, mass lesion, or extra-axial  fluid. Hydrocephalus ex vacuo. No features to suggest normal  pressure hydrocephalus.  Post infusion, no abnormal enhancement of the brain or meninges. No  acute or reversible cause of dementia is evident.  Pituitary, pineal, and cerebellar tonsils unremarkable. No upper   cervical lesions. Flow voids are maintained throughout the carotid,  basilar, and vertebral arteries. There are no areas of chronic  hemorrhage. Visualized calvarium, skull base, and upper cervical  osseous structures unremarkable. Scalp and extracranial soft  tissues, orbits, sinuses, and mastoids show no acute process.  Similar appearance to priors.  IMPRESSION:  Atrophy and small vessel disease. No acute intracranial findings.    PREVIOUS MEDICATIONS: Sinemet  ALLERGIES:   Allergies  Allergen Reactions  . Codeine Sulfate     REACTION: hallucinations  . Penicillins Anaphylaxis, Swelling and Rash  . Wellbutrin [Bupropion] Other (See Comments)    Seizures and hallucinations    CURRENT MEDICATIONS:  Current Outpatient Prescriptions on File Prior to Visit  Medication Sig Dispense Refill  . ALPRAZolam (XANAX) 0.5 MG tablet TAKE 1 TABLET BY MOUTH 2 TIMES A DAY 60 tablet 0  . aspirin 81 MG tablet Take 81 mg by mouth daily.     Marland Kitchen Bioflavonoid Products (GRAPE SEED PO) Take 1 tablet by mouth daily.     . Calcium Carbonate-Vitamin D (CALCIUM + D PO) Take 600 mg by mouth daily.    Marland Kitchen  carbidopa-levodopa (SINEMET IR) 25-100 MG tablet TAKE 3 TABLETS BY MOUTH 3 TIMES A DAY 810 tablet 0  . Cholecalciferol (VITAMIN D3) 1000 UNITS CAPS Take 1 capsule by mouth daily.      . feeding supplement (ENSURE COMPLETE) LIQD Take 237 mLs by mouth 2 (two) times daily between meals. 1 Bottle 11  . fexofenadine (ALLEGRA) 180 MG tablet Take 180 mg by mouth daily.    Marland Kitchen FLUoxetine (PROZAC) 20 MG capsule TAKE ONE CAPSULE BY MOUTH EVERY DAY 90 capsule 1  . gabapentin (NEURONTIN) 100 MG capsule TAKE ONE CAPSULE BY MOUTH 3 TIMES A DAY 270 capsule 0  . losartan-hydrochlorothiazide (HYZAAR) 50-12.5 MG tablet TAKE 1 TABLET BY MOUTH DAILY 90 tablet 3  . mirtazapine (REMERON) 30 MG tablet TAKE 1 TABLET (30 MG TOTAL) BY MOUTH AT BEDTIME. 90 tablet 0  . Multiple Vitamins-Minerals (CENTRUM SILVER ADULT 50+) TABS Take by  mouth.    . ranitidine (ZANTAC) 150 MG tablet Take 150 mg by mouth daily as needed for heartburn.    . simvastatin (ZOCOR) 20 MG tablet TAKE 1 TABLET (20 MG TOTAL) BY MOUTH AT BEDTIME. 90 tablet 3  . traMADol (ULTRAM) 50 MG tablet Take 1 tablet (50 mg total) by mouth every 6 (six) hours as needed. 15 tablet 0  . vitamin B-12 (CYANOCOBALAMIN) 1000 MCG tablet Take 1,000 mcg by mouth daily.       No current facility-administered medications on file prior to visit.     PAST MEDICAL HISTORY:   Past Medical History:  Diagnosis Date  . Adjustment disorder with depressed mood   . Anxiety   . Bell's palsy    1985  . DEMENTIA   . Depressive disorder, not elsewhere classified   . 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 DVT (deep venous thrombosis), left "early 2000's"   RLE  . Dysmetabolic syndrome X   . Essential hypertension, benign   . [id number]) 05/2011   "pretty often since they put me on Parkinson's medicine"  . History of shingles    "as a teen and in my 66's"  . Mixed hyperlipidemia   . Morbid obesity ([place])   . Neurodegenerative gait disorder   . Parkinson disease [facility])    "they think that's what it is"  . Personal history of thrombophlebitis   . Syncope and collapse 12/06/11   "this am; I pass out fairly often"  . Type II or unspecified type diabetes mellitus without mention of complication, not stated as uncontrolled 12/06/11   "not anymore; had lap band OR"    PAST SURGICAL HISTORY:   Past Surgical History:  Procedure Laterality Date  . bladder tack  11/1995  . BREAST BIOPSY  05/20/2000   bilaterally  . BREAST CYST ASPIRATION  12/1998   bilaterally  . CARDIAC CATHETERIZATION  2004  . CATARACT EXTRACTION W/ INTRAOCULAR LENS  IMPLANT, BILATERAL    . FRACTURE SURGERY  02/09/2006   bilateral elbows  . FRACTURE SURGERY     right knee  . FRACTURE SURGERY     tib plateau  . KNEE ARTHROSCOPY  12/12/2002   right   . LAPAROSCOPIC GASTRIC  BANDING  2008  . SEPTOPLASTY  08/1998   with antral window   . TONSILLECTOMY AND ADENOIDECTOMY  1970  . TUBAL LIGATION  1970's    SOCIAL HISTORY:   Social History   Social History  . Marital status: Widowed    Spouse name: N/A  .  Number of children: 2  . Years of education: N/A   Occupational History  . 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 Smoking status: Never Smoker  . Smokeless tobacco: Never Used  . Alcohol use No     Comment: none  . Drug use: No  . Sexual activity: No   Other Topics Concern  . Not on file   Social History Narrative   Lives with daughter and granddaughter.   Daughter is HPOA.   DNR.    FAMILY HISTORY:   Family Status  Relation Status  . Mother Deceased at age 8   natural causes  . Father Deceased at age 32   MI  . Sister Alive   DM-2  . Child Alive   2, HTN, DM-2, hyperlipidemia    ROS:  A complete 10 system review of systems was obtained and was unremarkable apart from what is mentioned above.  PHYSICAL EXAMINATION:    VITALS:   Vitals:   08/23/16 0811  BP: 120/80  Pulse: 69  Weight: 154 lb (69.9 kg)  Height: 5' 1.5" (1.562 m)     GEN:  The patient appears stated age and is in NAD. HEENT:  Normocephalic, atraumatic.  There is a slight sore bump on the L occiput.  The mucous membranes are moist. The superficial temporal arteries are without ropiness or tenderness. CV:  RRR Lungs:  CTAB Neck/HEME:  There are no carotid bruits bilaterally.  Neurological examination:  Orientation: The patient was alert and oriented x 3.  MocA 04/11/2012 was 25/30, 04/03/13 was 20/30, and 05/07/14 and was 26/30 Cranial nerves: There is L facial droop with decreased forehead wrinkle on the L.  She has a positive stellwags sign. Pupils are equal round and reactive to light bilaterally. Fundoscopic exam reveals clear margins bilaterally. Extraocular muscles are intact.  There are square wave jerks and  difficulty with smooth pursuit.  The speech is hypophonic but fluent and clear. She has no trouble repeating gutteral sounds.  Soft palate rises symmetrically and there is no tongue deviation. Hearing is intact to conversational tone. Sensation: Sensation is intact to light touch throughout.   Motor: Strength is 5/5 in the bilateral upper and lower extremities.   Shoulder shrug is equal and symmetric.  There is no pronator drift.  Movement examination: Tone: There is normal tone in the bilateral upper extremities today. The tone in the lower extremities is normal.  Abnormal movements:  No tremor noted.   Coordination:  There is decremation with RAM's, only with heel taps on the L.   Gait and Station: The patient is short stepped and mildly unstable.  She has some difficulty with turns.  LABS:  Lab Results  Component Value Date   WBC 8.2 06/13/2016   HGB 12.6 06/13/2016   HCT 39.1 06/13/2016   MCV 96.1 06/13/2016   PLT 204 06/13/2016     Chemistry      Component Value Date/Time   NA 139 06/13/2016 0326   K 3.5 06/13/2016 0326   CL 105 06/13/2016 0326   CO2 27 06/13/2016 0326   BUN 16 06/13/2016 0326   CREATININE 0.68 06/13/2016 0326      Component Value Date/Time   CALCIUM 9.1 06/13/2016 0326   ALKPHOS 62 06/13/2016 0326   AST 16 06/13/2016 0326   ALT <5 (L) 06/13/2016 0326   BILITOT 0.3 06/13/2016 0326     Lab Results  Component  Value Date   TSH 1.82 12/01/2015   Lab Results  Component Value Date   VITAMINB12 1,387 (H) 11/16/2012    Lab Results  Component Value Date   HGBA1C 6.1 (H) 06/11/2016   Lab Results  Component Value Date   ESRSEDRATE 18 04/03/2013    ASSESSMENT/PLAN: 1.  Vascular Parkinsonism.  Her primary issue is gait instability with slowness of movement.  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  However, both the patient and her  daughter think that levodopa has been beneficial and they think it continues to be beneficial, as I actually considered tapering it.  Neither of them wanted that. symptoms got worse when I tried this in the past.    -she will remain on carbidopa/levodopa 25/100, 3 tablets 3 times per day.  Risks, benefits, side effects and alternative therapies were discussed.  The opportunity to ask questions was given and they were answered to the best of my ability.  The patient expressed understanding and willingness to follow the outlined treatment protocols.  -Safety discussed.  Pt education provided.  -will get copy of PE labs when done.  Will have her A1C repeated since borderline.   2.  Hx of seizure.  This is likely secondary to the initiation of wellbutrin.  She should remain off of this medication.  EEG in the hospital was normal.  She has been seizure-free for several years. 3.  Depression.   -on remeron.  Managed by PCP.  - I think her depression is situational and related to not being able to drive.  They don't want to pursue driving eval.      -No SI/HI  -I encouraged her to exercise safely.   4.  Memory change  -Because I was not convinced that the patient had a dementia but her previous neuropsych test years ago said that she did and because some of the patient's depression surrounds the fact that her daughter will not let her drive because of this potential diagnosis, we repeated neuropsych testing in 10/2014.  While there was evidence of cognitive change due to depression, she did not meet the criteria for the diagnosis of dementia. I told her again, that I was not convinced of dementia and neuropsych didn't support this right now.  Pt is not driving.  Dr. Conley Canal and I both gave her information on a medical driving evaluation but they have decided to not pursue that and not drive. 5.  Chronic lumbar radiculopathy  -She is doing better in this regard  -drop gabapentin 100 bid as no longer having  radiculopathy and headaches better controlled.  In addtion, c/o memory issue although don't think memory related.   6.  F/u 4-5 months, sooner should new issues arise.  Much greater than 50% of this visit was spent in counseling with the patient and the family.  Total face to face time:  25 min

## 2016-08-22 ENCOUNTER — Other Ambulatory Visit: Payer: Self-pay | Admitting: Neurology

## 2016-08-23 ENCOUNTER — Encounter: Payer: Self-pay | Admitting: Neurology

## 2016-08-23 ENCOUNTER — Ambulatory Visit (INDEPENDENT_AMBULATORY_CARE_PROVIDER_SITE_OTHER): Payer: Medicare Other | Admitting: Neurology

## 2016-08-23 VITALS — BP 120/80 | HR 69 | Ht 61.5 in | Wt 154.0 lb

## 2016-08-23 DIAGNOSIS — M5417 Radiculopathy, lumbosacral region: Secondary | ICD-10-CM | POA: Diagnosis not present

## 2016-08-23 DIAGNOSIS — R413 Other amnesia: Secondary | ICD-10-CM | POA: Diagnosis not present

## 2016-08-23 DIAGNOSIS — F330 Major depressive disorder, recurrent, mild: Secondary | ICD-10-CM | POA: Diagnosis not present

## 2016-08-23 DIAGNOSIS — G214 Vascular parkinsonism: Secondary | ICD-10-CM | POA: Diagnosis not present

## 2016-08-23 NOTE — Patient Instructions (Addendum)
Decrease gabapentin to 100 mg twice per day (stop the middle of the day dosage) Call me if you need refills of medication

## 2016-09-02 ENCOUNTER — Encounter: Payer: Self-pay | Admitting: Neurology

## 2016-09-13 ENCOUNTER — Telehealth: Payer: Self-pay | Admitting: Neurology

## 2016-09-13 NOTE — Telephone Encounter (Signed)
ok 

## 2016-09-13 NOTE — Telephone Encounter (Signed)
Patient made aware okay to go back to three times daily.

## 2016-09-13 NOTE — Telephone Encounter (Signed)
Spoke with patient - she tried to switch to Gabapentin 100 mg BID instead of TID per Dr. Carles Collet.  She states headaches are worse. Her lower back pain seems to be doing okay. She would like to go back to 100 mg TID. Please advise.

## 2016-09-13 NOTE — Telephone Encounter (Signed)
Patient has some questions about mediation please call [phone]

## 2016-09-17 ENCOUNTER — Other Ambulatory Visit: Payer: Self-pay | Admitting: Neurology

## 2016-10-10 ENCOUNTER — Other Ambulatory Visit: Payer: Self-pay | Admitting: Family Medicine

## 2016-10-11 NOTE — Telephone Encounter (Signed)
Left refill on voice mail at pharmacy  

## 2016-10-11 NOTE — Telephone Encounter (Signed)
Last filled 09-17-16 #60 Last OV 06-16-16 Acute Next OV 12-09-16

## 2016-10-18 ENCOUNTER — Telehealth: Payer: Self-pay | Admitting: Family Medicine

## 2016-10-18 NOTE — Telephone Encounter (Signed)
Pt has appt to see Dr Deborra Medina on 10/19/16 at 7:15.

## 2016-10-18 NOTE — Telephone Encounter (Signed)
Patient Name: [name] DOB: [birth year] Initial Comment Caller states that she needs an appointment. She has acid reflux and her stomach has been burning for about 2 or 3 days. She gets mucus caught in her throat sometimes as well. Nurse Assessment Nurse: Jimmye Norman, RN, [place] Date/Time (Eastern Time): 10/18/2016 12:59:38 PM Confirm and document reason for call. If symptomatic, describe symptoms. ---Caller states that she needs an appointment. She has acid reflux and her stomach has been burning for about 2 or 3 days. She gets mucus caught in her throat sometimes as well. Does the patient have any new or worsening symptoms? ---Yes Will a triage be completed? ---Yes Related visit to physician within the last 2 weeks? ---No Does the PT have any chronic conditions? (i.e. diabetes, asthma, etc.) ---Yes List chronic conditions. ---GERD, parkinsons, dementia, anxiety, hypertension, high cholesterol, Is this a behavioral health or substance abuse call? ---No Guidelines Guideline Title Affirmed Question Affirmed Notes Abdominal Pain - Upper [1] Pain lasts > 10 minutes AND [2] age > 22 Final Disposition User Go to ED Now Jimmye Norman, RN, [facility] - ED Disagree/Comply: Comply

## 2016-10-19 ENCOUNTER — Ambulatory Visit (INDEPENDENT_AMBULATORY_CARE_PROVIDER_SITE_OTHER): Payer: Medicare Other | Admitting: Family Medicine

## 2016-10-19 ENCOUNTER — Encounter: Payer: Self-pay | Admitting: Family Medicine

## 2016-10-19 ENCOUNTER — Ambulatory Visit: Payer: Self-pay | Admitting: Family Medicine

## 2016-10-19 VITALS — BP 126/88 | HR 71 | Temp 98.1°F | Ht 61.0 in | Wt 154.0 lb

## 2016-10-19 DIAGNOSIS — J069 Acute upper respiratory infection, unspecified: Secondary | ICD-10-CM

## 2016-10-19 NOTE — Progress Notes (Signed)
SUBJECTIVE:  [name] is a 71 y.o. female who complains of coryza, congestion, productive cough and bilateral sinus pain for 7 days. She denies a history of anorexia and chest pain and denies a history of asthma. Patient denies smoke cigarettes.   Current Outpatient Prescriptions on File Prior to Visit  Medication Sig Dispense Refill  . ALPRAZolam (XANAX) 0.5 MG tablet TAKE 1 TABLET BY MOUTH TWICE A DAY 60 tablet 0  . aspirin 81 MG tablet Take 81 mg by mouth daily.     Marland Kitchen Bioflavonoid Products (GRAPE SEED PO) Take 1 tablet by mouth daily.     . Calcium Carbonate-Vitamin D (CALCIUM + D PO) Take 600 mg by mouth daily.    . carbidopa-levodopa (SINEMET IR) 25-100 MG tablet TAKE 3 TABLETS BY MOUTH 3 TIMES A DAY 810 tablet 1  . Cholecalciferol (VITAMIN D3) 1000 UNITS CAPS Take 1 capsule by mouth daily.      . feeding supplement (ENSURE COMPLETE) LIQD Take 237 mLs by mouth 2 (two) times daily between meals. 1 Bottle 11  . fexofenadine (ALLEGRA) 180 MG tablet Take 180 mg by mouth daily.    Marland Kitchen FLUoxetine (PROZAC) 20 MG capsule TAKE ONE CAPSULE BY MOUTH EVERY DAY 90 capsule 1  . gabapentin (NEURONTIN) 100 MG capsule TAKE ONE CAPSULE BY MOUTH 3 TIMES A DAY 270 capsule 0  . losartan-hydrochlorothiazide (HYZAAR) 50-12.5 MG tablet TAKE 1 TABLET BY MOUTH DAILY 90 tablet 3  . mirtazapine (REMERON) 30 MG tablet TAKE 1 TABLET (30 MG TOTAL) BY MOUTH AT BEDTIME. 90 tablet 0  . Multiple Vitamins-Minerals (CENTRUM SILVER ADULT 50+) TABS Take by mouth.    . ranitidine (ZANTAC) 150 MG tablet Take 150 mg by mouth daily as needed for heartburn.    . simvastatin (ZOCOR) 20 MG tablet TAKE 1 TABLET (20 MG TOTAL) BY MOUTH AT BEDTIME. 90 tablet 3  . traMADol (ULTRAM) 50 MG tablet Take 1 tablet (50 mg total) by mouth every 6 (six) hours as needed. 15 tablet 0  . vitamin B-12 (CYANOCOBALAMIN) 1000 MCG tablet Take 1,000 mcg by mouth daily.       No current facility-administered medications on file prior to visit.      Allergies  Allergen Reactions  . Codeine Sulfate     REACTION: hallucinations  . Penicillins Anaphylaxis, Swelling and Rash  . Wellbutrin [Bupropion] Other (See Comments)    Seizures and hallucinations    Past Medical History:  Diagnosis Date  . Adjustment disorder with depressed mood   . Anxiety   . Bell's palsy    1985  . DEMENTIA   . Depressive disorder, not elsewhere classified   . 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 DVT (deep venous thrombosis), left "early 2000's"   RLE  . Dysmetabolic syndrome X   . Essential hypertension, benign   . IHWTUUEK(800.3) 05/2011   "pretty often since they put me on Parkinson's medicine"  . History of shingles    "as a teen and in my 50's"  . Mixed hyperlipidemia   . Morbid obesity ([place])   . Neurodegenerative gait disorder   . Parkinson disease [facility])    "they think that's what it is"  . Personal history of thrombophlebitis   . Syncope and collapse 12/06/11   "this am; I pass out fairly often"  . Type II or unspecified type diabetes mellitus without mention of complication, not stated as uncontrolled 12/06/11   "not anymore;  had lap band OR"    Past Surgical History:  Procedure Laterality Date  . bladder tack  11/1995  . BREAST BIOPSY  05/20/2000   bilaterally  . BREAST CYST ASPIRATION  12/1998   bilaterally  . CARDIAC CATHETERIZATION  2004  . CATARACT EXTRACTION W/ INTRAOCULAR LENS  IMPLANT, BILATERAL    . FRACTURE SURGERY  02/09/2006   bilateral elbows  . FRACTURE SURGERY     right knee  . FRACTURE SURGERY     tib plateau  . KNEE ARTHROSCOPY  12/12/2002   right   . LAPAROSCOPIC GASTRIC BANDING  2008  . SEPTOPLASTY  08/1998   with antral window   . TONSILLECTOMY AND ADENOIDECTOMY  1970  . TUBAL LIGATION  1970's    Family History  Problem Relation Age of Onset  . Asthma Mother   . COPD Father   . Heart disease Father   . Diabetes Sister   . Vision loss Sister     legally blind     Social History   Social History  . Marital status: Widowed    Spouse name: N/A  . Number of children: 2  . Years of education: N/A   Occupational History  . 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 Smoking status: Never Smoker  . Smokeless tobacco: Never Used  . Alcohol use No     Comment: none  . Drug use: No  . Sexual activity: No   Other Topics Concern  . Not on file   Social History Narrative   Lives with daughter and granddaughter.   Daughter is HPOA.   DNR.   The PMH, PSH, Social History, Family History, Medications, and allergies have been reviewed in [facility], and have been updated if relevant.  OBJECTIVE: BP 126/88   Pulse 71   Temp 98.1 F (36.7 C)   Ht 5\' 1"  (1.549 m)   Wt 154 lb (69.9 kg)   SpO2 98%   BMI 29.10 kg/m   She appears well, vital signs are as noted. Ears normal.  Throat and pharynx normal.  Neck supple. No adenopathy in the neck. Nose is congested. Sinuses non tender. The chest is clear, without wheezes or rales.  ASSESSMENT:  viral upper respiratory illness  PLAN: Symptomatic therapy suggested: push fluids, rest and return office visit prn if symptoms persist or worsen. Lack of antibiotic effectiveness discussed with her. Call or return to clinic prn if these symptoms worsen or fail to improve as anticipated.

## 2016-10-25 ENCOUNTER — Ambulatory Visit: Payer: Self-pay | Admitting: Family Medicine

## 2016-11-04 ENCOUNTER — Other Ambulatory Visit: Payer: Self-pay | Admitting: Neurology

## 2016-11-04 ENCOUNTER — Telehealth: Payer: Self-pay | Admitting: Neurology

## 2016-11-04 MED ORDER — MIRTAZAPINE 30 MG PO TABS: 30.0000 mg | ORAL_TABLET | Freq: Every day | ORAL | 0 refills | Status: DC

## 2016-11-04 NOTE — Telephone Encounter (Signed)
Spoke with patient.  Last office note states medication managed by PCP but has been filled by Dr. Carles Collet.  Per Dr. Carles Collet okay to send. Patient made aware.

## 2016-11-04 NOTE — Telephone Encounter (Signed)
Received a call from [facility] regarding medication: Mirtazapine.  Patient needs a refill of medication. Yes  Patient having side effects from medication.  Did not say she was  Patient calling to update [place] on medication. She tried to refill the medication and the pharmacy told her She could not have it refilled. Thanks

## 2016-11-09 ENCOUNTER — Other Ambulatory Visit: Payer: Self-pay | Admitting: Family Medicine

## 2016-11-10 NOTE — Telephone Encounter (Signed)
Last filled 10-15-16 #60 Last OV acute 10-19-16 Next OV 12-09-16 [facility]

## 2016-11-10 NOTE — Telephone Encounter (Signed)
Left refill on voice mail at pharmacy  

## 2016-11-12 ENCOUNTER — Other Ambulatory Visit: Payer: Self-pay | Admitting: Family Medicine

## 2016-11-12 NOTE — Telephone Encounter (Signed)
Last filled 10-15-16 #60 Last OV 10-19-16 Next OV 12-09-16

## 2016-11-15 NOTE — Telephone Encounter (Signed)
Left refill on voice mail at pharmacy  

## 2016-12-06 ENCOUNTER — Ambulatory Visit (INDEPENDENT_AMBULATORY_CARE_PROVIDER_SITE_OTHER): Payer: Medicare Other

## 2016-12-06 VITALS — BP 122/78 | HR 75 | Temp 98.6°F | Ht 62.5 in | Wt 151.0 lb

## 2016-12-06 DIAGNOSIS — R7309 Other abnormal glucose: Secondary | ICD-10-CM | POA: Diagnosis not present

## 2016-12-06 DIAGNOSIS — E2839 Other primary ovarian failure: Secondary | ICD-10-CM | POA: Diagnosis not present

## 2016-12-06 DIAGNOSIS — E7800 Pure hypercholesterolemia, unspecified: Secondary | ICD-10-CM | POA: Diagnosis not present

## 2016-12-06 DIAGNOSIS — I10 Essential (primary) hypertension: Secondary | ICD-10-CM

## 2016-12-06 DIAGNOSIS — Z0000 Encounter for general adult medical examination without abnormal findings: Secondary | ICD-10-CM | POA: Diagnosis not present

## 2016-12-06 LAB — COMPREHENSIVE METABOLIC PANEL
ALT: 16 U/L (ref 0–35)
AST: 18 U/L (ref 0–37)
Albumin: 4.2 g/dL (ref 3.5–5.2)
Alkaline Phosphatase: 66 U/L (ref 39–117)
BUN: 23 mg/dL (ref 6–23)
CO2: 30 mEq/L (ref 19–32)
Calcium: 10.3 mg/dL (ref 8.4–10.5)
Chloride: 102 mEq/L (ref 96–112)
Creatinine, Ser: 0.63 mg/dL (ref 0.40–1.20)
GFR: 98.74 mL/min (ref >60.00)
Glucose, Bld: 102 mg/dL — ABNORMAL HIGH (ref 70–99)
Potassium: 4.1 mEq/L (ref 3.5–5.1)
Sodium: 141 mEq/L (ref 135–145)
Total Bilirubin: 0.6 mg/dL (ref 0.2–1.2)
Total Protein: 6.9 g/dL (ref 6.0–8.3)

## 2016-12-06 LAB — CBC WITH DIFFERENTIAL/PLATELET
Basophils Absolute: 0.1 10*3/uL (ref 0.0–0.1)
Basophils Relative: 0.6 % (ref 0.0–3.0)
Eosinophils Absolute: 0.2 10*3/uL (ref 0.0–0.7)
Eosinophils Relative: 2.4 % (ref 0.0–5.0)
HCT: 41.8 % (ref 36.0–46.0)
Hemoglobin: 13.7 g/dL (ref 12.0–15.0)
Lymphocytes Relative: 20.1 % (ref 12.0–46.0)
Lymphs Abs: 1.7 10*3/uL (ref 0.7–4.0)
MCHC: 32.9 g/dL (ref 30.0–36.0)
MCV: 94.3 fl (ref 78.0–100.0)
Monocytes Absolute: 0.6 10*3/uL (ref 0.1–1.0)
Monocytes Relative: 7.7 % (ref 3.0–12.0)
Neutro Abs: 5.8 10*3/uL (ref 1.4–7.7)
Neutrophils Relative %: 69.2 % (ref 43.0–77.0)
Platelets: 244 10*3/uL (ref 150.0–400.0)
RBC: 4.43 Mil/uL (ref 3.87–5.11)
RDW: 13.5 % (ref 11.5–15.5)
WBC: 8.3 10*3/uL (ref 4.0–10.5)

## 2016-12-06 LAB — LIPID PANEL
Cholesterol: 154 mg/dL (ref 0–200)
HDL: 56.6 mg/dL (ref >39.00)
LDL Cholesterol: 64 mg/dL (ref 0–99)
NonHDL: 97.79
Total CHOL/HDL Ratio: 3
Triglycerides: 167 mg/dL — ABNORMAL HIGH (ref 0.0–149.0)
VLDL: 33.4 mg/dL (ref 0.0–40.0)

## 2016-12-06 LAB — TSH: TSH: 0.97 u[IU]/mL (ref 0.35–4.50)

## 2016-12-06 LAB — HEMOGLOBIN A1C: Hgb A1c MFr Bld: 6.3 % (ref 4.6–6.5)

## 2016-12-06 NOTE — Progress Notes (Signed)
Pre visit review using our clinic review tool, if applicable. No additional management support is needed unless otherwise documented below in the visit note. 

## 2016-12-06 NOTE — Progress Notes (Signed)
PCP notes:   Health maintenance:  Bone density - ordered/verbal approval from PCP A1C - completed Mammogram - pt plans to schedule future appt Foot exam - PCP please address at next appt  Abnormal screenings:   Hearing - failed Fall risk - hx of fall without injury; no medical treatment Depression score: 1  Patient concerns:   Pt reports acute history of productive coughing and feeling of stuffy head. Pt takes Zyrtec each night and Mucinex every 12 hours. Pt declined an acute visit on 12/07/16.   Nurse concerns:  None  Next PCP appt:   12/09/16 @ 0830

## 2016-12-06 NOTE — Patient Instructions (Signed)
[name] , Thank you for taking time to come for your Medicare Wellness Visit. I appreciate your ongoing commitment to your health goals. Please review the following plan we discussed and let me know if I can assist you in the future.   These are the goals we discussed: Goals    . Increase physical activity          Starting 12/24/16, I will attempt to walk at least 5 minutes daily.        This is a list of the screening recommended for you and due dates:  Health Maintenance  Topic Date Due  . Complete foot exam   12/09/2016*  . Mammogram  04/27/2017*  . DEXA scan (bone density measurement)  12/06/2017*  . Hemoglobin A1C  12/09/2016  . Eye exam for diabetics  01/08/2017  . Flu Shot  02/23/2017  . Cologuard (Stool DNA test)  12/15/2018  . Tetanus Vaccine  06/21/2022  .  Hepatitis C: One time screening is recommended by Center for Disease Control  (CDC) for  adults born from [birth year] through 1966.   Completed  . Pneumonia vaccines  Completed  *Topic was postponed. The date shown is not the original due date.   Preventive Care for Adults  A healthy lifestyle and preventive care can promote health and wellness. Preventive health guidelines for adults include the following key practices.  . A routine yearly physical is a good way to check with your health care provider about your health and preventive screening. It is a chance to share any concerns and updates on your health and to receive a thorough exam.  . Visit your dentist for a routine exam and preventive care every 6 months. Brush your teeth twice a day and floss once a day. Good oral hygiene prevents tooth decay and gum disease.  . The frequency of eye exams is based on your age, health, family medical history, use  of contact lenses, and other factors. Follow your health care provider's ecommendations for frequency of eye exams.  . Eat a healthy diet. Foods like vegetables, fruits, whole grains, low-fat dairy products, and  lean protein foods contain the nutrients you need without too many calories. Decrease your intake of foods high in solid fats, added sugars, and salt. Eat the right amount of calories for you. Get information about a proper diet from your health care provider, if necessary.  . Regular physical exercise is one of the most important things you can do for your health. Most adults should get at least 150 minutes of moderate-intensity exercise (any activity that increases your heart rate and causes you to sweat) each week. In addition, most adults need muscle-strengthening exercises on 2 or more days a week.  Silver Sneakers may be a benefit available to you. To determine eligibility, you may visit the website: www.silversneakers.com or contact program at [phone] Mon-Fri between 8AM-8PM.   . Maintain a healthy weight. The body mass index (BMI) is a screening tool to identify possible weight problems. It provides an estimate of body fat based on height and weight. Your health care provider can find your BMI and can help you achieve or maintain a healthy weight.   For adults 20 years and older: ? A BMI below 18.5 is considered underweight. ? A BMI of 18.5 to 24.9 is normal. ? A BMI of 25 to 29.9 is considered overweight. ? A BMI of 30 and above is considered obese.   . Maintain normal blood lipids  and cholesterol levels by exercising and minimizing your intake of saturated fat. Eat a balanced diet with plenty of fruit and vegetables. Blood tests for lipids and cholesterol should begin at age 76 and be repeated every 5 years. If your lipid or cholesterol levels are high, you are over 50, or you are at high risk for heart disease, you may need your cholesterol levels checked more frequently. Ongoing high lipid and cholesterol levels should be treated with medicines if diet and exercise are not working.  . If you smoke, find out from your health care provider how to quit. If you do not use tobacco,  please do not start.  . If you choose to drink alcohol, please do not consume more than 2 drinks per day. One drink is considered to be 12 ounces (355 mL) of beer, 5 ounces (148 mL) of wine, or 1.5 ounces (44 mL) of liquor.  . If you are 24-72 years old, ask your health care provider if you should take aspirin to prevent strokes.  . Use sunscreen. Apply sunscreen liberally and repeatedly throughout the day. You should seek shade when your shadow is shorter than you. Protect yourself by wearing long sleeves, pants, a wide-brimmed hat, and sunglasses year round, whenever you are outdoors.  . Once a month, do a whole body skin exam, using a mirror to look at the skin on your back. Tell your health care provider of new moles, moles that have irregular borders, moles that are larger than a pencil eraser, or moles that have changed in shape or color.

## 2016-12-06 NOTE — Progress Notes (Signed)
Subjective:   [name] is a 71 y.o. female who presents for Medicare Annual (Subsequent) preventive examination.  Review of Systems:  N/A Cardiac Risk Factors include: advanced age (>50men, >24 women);hypertension;dyslipidemia     Objective:     Vitals: BP 122/78 (BP Location: Right Arm, Patient Position: Sitting, Cuff Size: Normal)   Pulse 75   Temp 98.6 F (37 C) (Oral)   Ht 5' 2.5" (1.588 m) Comment: no shoes  Wt 151 lb (68.5 kg)   SpO2 97%   BMI 27.18 kg/m   Body mass index is 27.18 kg/m.   Tobacco History  Smoking Status  . Never Smoker  Smokeless Tobacco  . Never Used     Counseling given: No   Past Medical History:  Diagnosis Date  . Adjustment disorder with depressed mood   . Anxiety   . Bell's palsy    1985  . DEMENTIA   . Depressive disorder, not elsewhere classified   . 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 DVT (deep venous thrombosis), left "early 2000's"   RLE  . Dysmetabolic syndrome X   . Essential hypertension, benign   . DJSHFWYO(378.5) 05/2011   "pretty often since they put me on Parkinson's medicine"  . History of shingles    "as a teen and in my 77's"  . Mixed hyperlipidemia   . Morbid obesity ([place])   . Neurodegenerative gait disorder   . Parkinson disease [facility])    "they think that's what it is"  . Personal history of thrombophlebitis   . Syncope and collapse 12/06/11   "this am; I pass out fairly often"  . Type II or unspecified type diabetes mellitus without mention of complication, not stated as uncontrolled 12/06/11   "not anymore; had lap band OR"   Past Surgical History:  Procedure Laterality Date  . bladder tack  11/1995  . BREAST BIOPSY  05/20/2000   bilaterally  . BREAST CYST ASPIRATION  12/1998   bilaterally  . CARDIAC CATHETERIZATION  2004  . CATARACT EXTRACTION W/ INTRAOCULAR LENS  IMPLANT, BILATERAL    . FRACTURE SURGERY  02/09/2006   bilateral elbows  . FRACTURE SURGERY      right knee  . FRACTURE SURGERY     tib plateau  . KNEE ARTHROSCOPY  12/12/2002   right   . LAPAROSCOPIC GASTRIC BANDING  2008  . SEPTOPLASTY  08/1998   with antral window   . TONSILLECTOMY AND ADENOIDECTOMY  1970  . TUBAL LIGATION  1970's   Family History  Problem Relation Age of Onset  . Asthma Mother   . COPD Father   . Heart disease Father   . Diabetes Sister   . Vision loss Sister        legally blind   History  Sexual Activity  . Sexual activity: No    Outpatient Encounter Prescriptions as of 12/06/2016  Medication Sig  . ALPRAZolam (XANAX) 0.5 MG tablet TAKE 1 TABLET BY MOUTH TWICE A DAY AS NEEDED  . aspirin 81 MG tablet Take 81 mg by mouth daily.   Marland Kitchen Bioflavonoid Products (GRAPE SEED PO) Take 1 tablet by mouth daily.   . Calcium Carbonate-Vitamin D (CALCIUM + D PO) Take 600 mg by mouth daily.  . carbidopa-levodopa (SINEMET IR) 25-100 MG tablet TAKE 3 TABLETS BY MOUTH 3 TIMES A DAY  . Cholecalciferol (VITAMIN D3) 1000 UNITS CAPS Take 1 capsule by mouth daily.    Marland Kitchen  feeding supplement (ENSURE COMPLETE) LIQD Take 237 mLs by mouth 2 (two) times daily between meals.  . fexofenadine (ALLEGRA) 180 MG tablet Take 180 mg by mouth daily.  Marland Kitchen FLUoxetine (PROZAC) 20 MG capsule TAKE ONE CAPSULE BY MOUTH EVERY DAY  . gabapentin (NEURONTIN) 100 MG capsule TAKE ONE CAPSULE BY MOUTH 3 TIMES A DAY  . losartan-hydrochlorothiazide (HYZAAR) 50-12.5 MG tablet TAKE 1 TABLET BY MOUTH DAILY  . mirtazapine (REMERON) 30 MG tablet Take 1 tablet (30 mg total) by mouth at bedtime.  . Multiple Vitamins-Minerals (CENTRUM SILVER ADULT 50+) TABS Take by mouth.  . ranitidine (ZANTAC) 150 MG tablet Take 150 mg by mouth daily as needed for heartburn.  . simvastatin (ZOCOR) 20 MG tablet TAKE 1 TABLET (20 MG TOTAL) BY MOUTH AT BEDTIME.  . traMADol (ULTRAM) 50 MG tablet Take 1 tablet (50 mg total) by mouth every 6 (six) hours as needed.  . vitamin B-12 (CYANOCOBALAMIN) 1000 MCG tablet Take 1,000 mcg by  mouth daily.     No facility-administered encounter medications on file as of 12/06/2016.     Activities of Daily Living In your present state of health, do you have any difficulty performing the following activities: 12/06/2016 06/11/2016  Hearing? N N  Vision? N N  Difficulty concentrating or making decisions? Y N  Walking or climbing stairs? Y N  Dressing or bathing? N N  Doing errands, shopping? Y N  Preparing Food and eating ? N -  Using the Toilet? N -  In the past six months, have you accidently leaked urine? Y -  Do you have problems with loss of bowel control? N -  Managing your Medications? N -  Managing your Finances? Y -  Housekeeping or managing your Housekeeping? N -  Some recent data might be hidden    Patient Care Team: Lucille Passy, MD as PCP - General Tat, Eustace Quail, DO as Consulting Physician (Neurology)    Assessment:     Hearing Screening   125Hz  250Hz  500Hz  1000Hz  2000Hz  3000Hz  4000Hz  6000Hz  8000Hz   Right ear:   40 0 40  40    Left ear:   40 40 40  0    Vision Screening Comments: Last vision exam in July 2017 @ [facility]   Exercise Activities and Dietary recommendations Current Exercise Habits: The patient does not participate in regular exercise at present, Exercise limited by: neurologic condition(s)  Goals    . Increase physical activity          Starting 12/24/16, I will attempt to walk at least 5 minutes daily.       Fall Risk Fall Risk  12/06/2016 08/23/2016 04/22/2016 12/04/2015 12/03/2014  Falls in the past year? Yes Yes Yes No Yes  Number falls in past yr: 2 or more 2 or more 2 or more - 1  Injury with Fall? No No No - No  Risk Factor Category  - High Fall Risk - - -  Risk for fall due to : - - - - -  Follow up - Falls evaluation completed Falls evaluation completed - -   Depression Screen PHQ 2/9 Scores 12/06/2016 12/04/2015 12/03/2014 11/26/2013  PHQ - 2 Score 1 1 1  0  PHQ- 9 Score - - - 6     Cognitive Function MMSE - Mini  Mental State Exam 12/06/2016 08/23/2016  Not completed: - Refused  Orientation to time 5 -  Orientation to Place 5 -  Registration 3 -  Attention/ Calculation 0 -  Recall 3 -  Language- name 2 objects 0 -  Language- repeat 1 -  Language- follow 3 step command 3 -  Language- read & follow direction 0 -  Write a sentence 0 -  Copy design 0 -  Total score 20 -   PLEASE NOTE: A Mini-Cog screen was completed. Maximum score is 20. A value of 0 denotes this part of Folstein MMSE was not completed or the patient failed this part of the Mini-Cog screening.   Mini-Cog Screening Orientation to Time - Max 5 pts Orientation to Place - Max 5 pts Registration - Max 3 pts Recall - Max 3 pts Language Repeat - Max 1 pts Language Follow 3 Step Command - Max 3 pts  Montreal Cognitive Assessment  05/07/2014  Visuospatial/ Executive (0/5) 2  Naming (0/3) 2  Attention: Read list of digits (0/2) 1  Attention: Read list of letters (0/1) 1  Attention: Serial 7 subtraction starting at 100 (0/3) 3  Language: Repeat phrase (0/2) 2  Language : Fluency (0/1) 1  Abstraction (0/2) 2  Delayed Recall (0/5) 5  Orientation (0/6) 6  Total 25  Adjusted Score (based on education) 26      Immunization History  Administered Date(s) Administered  . H1N1 08/29/2008  . Influenza Split 04/07/2012  . Influenza Whole 04/16/2010  . Influenza,inj,Quad PF,36+ Mos 04/03/2013, 06/13/2014, 05/27/2015, 04/22/2016  . Pneumococcal Conjugate-13 11/26/2013  . Pneumococcal Polysaccharide-23 04/16/2010  . Td 08/29/2008  . Tdap 06/21/2012  . Zoster 04/18/2008   Screening Tests Health Maintenance  Topic Date Due  . FOOT EXAM  12/09/2016 (Originally 12/03/2015)  . MAMMOGRAM  04/27/2017 (Originally 04/28/2016)  . DEXA SCAN  12/06/2017 (Originally 12/23/2009)  . HEMOGLOBIN A1C  12/09/2016  . OPHTHALMOLOGY EXAM  01/08/2017  . INFLUENZA VACCINE  02/23/2017  . Fecal DNA (Cologuard)  12/15/2018  . TETANUS/TDAP  06/21/2022  .  Hepatitis C Screening  Completed  . PNA vac Low Risk Adult  Completed      Plan:   I have personally reviewed and addressed the Medicare Annual Wellness questionnaire and have noted the following in the patient's chart:  A. Medical and social history B. Use of alcohol, tobacco or illicit drugs  C. Current medications and supplements D. Functional ability and status E.  Nutritional status F.  Physical activity G. Advance directives H. List of other physicians I.  Hospitalizations, surgeries, and ER visits in previous 12 months J.  [place] to include hearing, vision, cognitive, depression L. Referrals and appointments - none  In addition, I have reviewed and discussed with patient certain preventive protocols, quality metrics, and best practice recommendations. A written personalized care plan for preventive services as well as general preventive health recommendations were provided to patient.  See attached scanned questionnaire for additional information.   Signed,   Lindell Noe, MHA, BS, LPN Health Coach

## 2016-12-06 NOTE — Progress Notes (Signed)
I reviewed health advisor's note, was available for consultation, and agree with documentation and plan.  

## 2016-12-07 ENCOUNTER — Other Ambulatory Visit: Payer: Self-pay | Admitting: Family Medicine

## 2016-12-07 DIAGNOSIS — Z1231 Encounter for screening mammogram for malignant neoplasm of breast: Secondary | ICD-10-CM

## 2016-12-09 ENCOUNTER — Encounter: Payer: Self-pay | Admitting: Family Medicine

## 2016-12-09 ENCOUNTER — Ambulatory Visit (INDEPENDENT_AMBULATORY_CARE_PROVIDER_SITE_OTHER): Payer: Medicare Other | Admitting: Family Medicine

## 2016-12-09 VITALS — BP 148/82 | HR 77 | Ht 62.5 in | Wt 151.0 lb

## 2016-12-09 DIAGNOSIS — Z66 Do not resuscitate: Secondary | ICD-10-CM

## 2016-12-09 DIAGNOSIS — Z0001 Encounter for general adult medical examination with abnormal findings: Secondary | ICD-10-CM | POA: Diagnosis not present

## 2016-12-09 DIAGNOSIS — N390 Urinary tract infection, site not specified: Secondary | ICD-10-CM | POA: Diagnosis not present

## 2016-12-09 DIAGNOSIS — E782 Mixed hyperlipidemia: Secondary | ICD-10-CM

## 2016-12-09 DIAGNOSIS — E119 Type 2 diabetes mellitus without complications: Secondary | ICD-10-CM

## 2016-12-09 DIAGNOSIS — F341 Dysthymic disorder: Secondary | ICD-10-CM

## 2016-12-09 DIAGNOSIS — Z87440 Personal history of urinary (tract) infections: Secondary | ICD-10-CM

## 2016-12-09 DIAGNOSIS — Z01419 Encounter for gynecological examination (general) (routine) without abnormal findings: Secondary | ICD-10-CM | POA: Insufficient documentation

## 2016-12-09 DIAGNOSIS — R350 Frequency of micturition: Secondary | ICD-10-CM | POA: Diagnosis not present

## 2016-12-09 DIAGNOSIS — I10 Essential (primary) hypertension: Secondary | ICD-10-CM

## 2016-12-09 DIAGNOSIS — R569 Unspecified convulsions: Secondary | ICD-10-CM

## 2016-12-09 LAB — POC URINALSYSI DIPSTICK (AUTOMATED)
Bilirubin, UA: NEGATIVE
Glucose, UA: NEGATIVE
Ketones, UA: NEGATIVE
Nitrite, UA: NEGATIVE
Spec Grav, UA: >=1.03 — AB (ref 1.010–1.025)
Urobilinogen, UA: 0.2 E.U./dL
pH, UA: 6 (ref 5.0–8.0)

## 2016-12-09 NOTE — Assessment & Plan Note (Signed)
UA pos for LE only. Send urine for cx. No abx warranted at this time.

## 2016-12-09 NOTE — Assessment & Plan Note (Signed)
Well controlled on current dose of statin. No changes made. 

## 2016-12-09 NOTE — Assessment & Plan Note (Signed)
Well controlled on current rxs. No changes made. 

## 2016-12-09 NOTE — Assessment & Plan Note (Signed)
Reasonable control. No changes made to rxs. 

## 2016-12-09 NOTE — Progress Notes (Signed)
Subjective:    Patient ID: [name], female    DOB: Oct 22, 1945, 71 y.o.   MRN: [id number]  HPI  71 yo pleasant female here with her daughter for CPE and follow up of chronic medical conditions.  Annual medicare wellness visit with Candis Musa, RN on 12/06/16. Note reviewed.   Remote h/o hysterectomy Mammogram 04/29/15 Td 06/21/12 Zostavax 04/18/08 Pneumovax 04/16/10 prevnar 13 11/26/13  ?PSP- diagnosis uncertain at this point but has found benefit from taking carbidopa/levodopa.  Followed by Dr. Carles Collet.   Was last see non 08/23/16.  Note reviewed.  Recurrent UTIs- having some urinary frequency today.  Wants UA checked.  HLD- decreased zocor to 20 mg daily- well controlled on lower dose.  Lab Results  Component Value Date   CHOL 154 12/06/2016   HDL 56.60 12/06/2016   LDLCALC 64 12/06/2016   TRIG 167.0 (H) 12/06/2016   CHOLHDL 3 12/06/2016   Lab Results  Component Value Date   CREATININE 0.63 12/06/2016   DM- diet controlled.   Lab Results  Component Value Date   HGBA1C 6.3 12/06/2016   Depression- symptoms stable with prozac and remeron.  HTN- has been well controlled on Hyzaar. Lab Results  Component Value Date   CREATININE 0.63 12/06/2016    Patient Active Problem List   Diagnosis Date Noted  . Well woman exam 12/09/2016  . GERD (gastroesophageal reflux disease) 08/26/2015  . Recurrent UTI 05/27/2015  . Diabetes mellitus type 2, diet-controlled ([place]) 12/03/2014  . History of recurrent UTIs 06/13/2014  . DNR (do not resuscitate) 11/23/2012  . Seizures ([place]) 04/03/2012  . Dysphagia 12/07/2011  . Orthostasis 12/07/2011  . Bell's palsy   . Hypersomnia 01/18/2011  . OSTEOPENIA 05/21/2010  . ANXIETY DEPRESSION 04/16/2010  . OBESITY, MORBID 02/16/2007  . HYPERLIPIDEMIA, MIXED 01/31/2007  . HYPERTENSION, BENIGN ESSENTIAL 01/31/2007   Past Medical History:  Diagnosis Date  . Adjustment disorder with depressed mood   . Anxiety   . Bell's palsy    1985  . DEMENTIA   . Depressive disorder, not elsewhere classified   . 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 DVT (deep venous thrombosis), left "early 2000's"   RLE  . Dysmetabolic syndrome X   . Essential hypertension, benign   . YJEHUDJS(970.2) 05/2011   "pretty often since they put me on Parkinson's medicine"  . History of shingles    "as a teen and in my 61's"  . Mixed hyperlipidemia   . Morbid obesity ([place])   . Neurodegenerative gait disorder   . Parkinson disease [facility])    "they think that's what it is"  . Personal history of thrombophlebitis   . Syncope and collapse 12/06/11   "this am; I pass out fairly often"  . Type II or unspecified type diabetes mellitus without mention of complication, not stated as uncontrolled 12/06/11   "not anymore; had lap band OR"   Past Surgical History:  Procedure Laterality Date  . bladder tack  11/1995  . BREAST BIOPSY  05/20/2000   bilaterally  . BREAST CYST ASPIRATION  12/1998   bilaterally  . CARDIAC CATHETERIZATION  2004  . CATARACT EXTRACTION W/ INTRAOCULAR LENS  IMPLANT, BILATERAL    . FRACTURE SURGERY  02/09/2006   bilateral elbows  . FRACTURE SURGERY     right knee  . FRACTURE SURGERY     tib plateau  . KNEE ARTHROSCOPY  12/12/2002   right   . LAPAROSCOPIC GASTRIC  BANDING  2008  . SEPTOPLASTY  08/1998   with antral window   . TONSILLECTOMY AND ADENOIDECTOMY  1970  . TUBAL LIGATION  1970's   Social History  Substance Use Topics  . Smoking status: Never Smoker  . Smokeless tobacco: Never Used  . Alcohol use No     Comment: none   Family History  Problem Relation Age of Onset  . Asthma Mother   . COPD Father   . Heart disease Father   . Diabetes Sister   . Vision loss Sister        legally blind   Allergies  Allergen Reactions  . Codeine Sulfate     REACTION: hallucinations  . Penicillins Anaphylaxis, Swelling and Rash  . Wellbutrin [Bupropion] Other (See Comments)    Seizures  and hallucinations   Current Outpatient Prescriptions on File Prior to Visit  Medication Sig Dispense Refill  . ALPRAZolam (XANAX) 0.5 MG tablet TAKE 1 TABLET BY MOUTH TWICE A DAY AS NEEDED 60 tablet 0  . aspirin 81 MG tablet Take 81 mg by mouth daily.     Marland Kitchen Bioflavonoid Products (GRAPE SEED PO) Take 1 tablet by mouth daily.     . Calcium Carbonate-Vitamin D (CALCIUM + D PO) Take 600 mg by mouth daily.    . carbidopa-levodopa (SINEMET IR) 25-100 MG tablet TAKE 3 TABLETS BY MOUTH 3 TIMES A DAY 810 tablet 1  . Cholecalciferol (VITAMIN D3) 1000 UNITS CAPS Take 1 capsule by mouth daily.      . feeding supplement (ENSURE COMPLETE) LIQD Take 237 mLs by mouth 2 (two) times daily between meals. 1 Bottle 11  . fexofenadine (ALLEGRA) 180 MG tablet Take 180 mg by mouth daily.    Marland Kitchen FLUoxetine (PROZAC) 20 MG capsule TAKE ONE CAPSULE BY MOUTH EVERY DAY 90 capsule 1  . gabapentin (NEURONTIN) 100 MG capsule TAKE ONE CAPSULE BY MOUTH 3 TIMES A DAY 270 capsule 0  . losartan-hydrochlorothiazide (HYZAAR) 50-12.5 MG tablet TAKE 1 TABLET BY MOUTH DAILY 90 tablet 3  . mirtazapine (REMERON) 30 MG tablet Take 1 tablet (30 mg total) by mouth at bedtime. 90 tablet 0  . Multiple Vitamins-Minerals (CENTRUM SILVER ADULT 50+) TABS Take by mouth.    . ranitidine (ZANTAC) 150 MG tablet Take 150 mg by mouth daily as needed for heartburn.    . simvastatin (ZOCOR) 20 MG tablet TAKE 1 TABLET (20 MG TOTAL) BY MOUTH AT BEDTIME. 90 tablet 3  . traMADol (ULTRAM) 50 MG tablet Take 1 tablet (50 mg total) by mouth every 6 (six) hours as needed. 15 tablet 0  . vitamin B-12 (CYANOCOBALAMIN) 1000 MCG tablet Take 1,000 mcg by mouth daily.       No current facility-administered medications on file prior to visit.    The PMH, PSH, Social History, Family History, Medications, and allergies have been reviewed in [facility], and have been updated if relevant.  Review of Systems  Constitutional: Negative.   HENT: Negative.   Respiratory: Negative.    Cardiovascular: Negative.   Endocrine: Negative.   Genitourinary: Positive for frequency. Negative for dysuria, hematuria and urgency.  Musculoskeletal: Negative for gait problem.  Skin: Negative.   Allergic/Immunologic: Negative.   Psychiatric/Behavioral: Negative for dysphoric mood, self-injury and suicidal ideas.  All other systems reviewed and are negative.     Objective:   Physical Exam BP (!) 148/82   Pulse 77   Ht 5' 2.5" (1.588 m)   Wt 151 lb (68.5 kg)   SpO2  98%   BMI 27.18 kg/m   General:  Well-developed,well-nourished,in no acute distress; alert,appropriate and cooperative throughout examination Head:  normocephalic and atraumatic.   Eyes:  vision grossly intact, PERRL Ears:  R ear normal and L ear normal externally, TMs clear bilaterally Nose:  no external deformity.   Mouth:  good dentition.   Neck:  No deformities, masses, or tenderness noted. Breasts:  No mass, nodules, thickening, tenderness, bulging, retraction, inflamation, nipple discharge or skin changes noted.   Lungs:  Normal respiratory effort, chest expands symmetrically. Lungs are clear to auscultation, no crackles or wheezes. Heart:  Normal rate and regular rhythm. S1 and S2 normal without gallop, murmur, click, rub or other extra sounds. Abdomen:  Bowel sounds positive,abdomen soft and non-tender without masses, organomegaly or hernias noted. Msk:  No deformity or scoliosis noted of thoracic or lumbar spine.   Extremities:  No clubbing, cyanosis, edema, or deformity noted with normal full range of motion of all joints.   Neurologic:  alert & oriented X3 and gait normal.   Skin:  Intact without suspicious lesions or rashes Psych:  Cognition and judgment appear intact. Alert and cooperative with normal attention span and concentration. No apparent delusions, illusions, hallucinations       Assessment & Plan:

## 2016-12-09 NOTE — Assessment & Plan Note (Signed)
Reviewed preventive care protocols, scheduled due services, and updated immunizations Discussed nutrition, exercise, diet, and healthy lifestyle.  

## 2016-12-09 NOTE — Progress Notes (Signed)
Pre visit review using our clinic review tool, if applicable. No additional management support is needed unless otherwise documented below in the visit note. 

## 2016-12-09 NOTE — Assessment & Plan Note (Signed)
Diet controlled.  

## 2016-12-10 LAB — URINE CULTURE: Organism ID, Bacteria: NO GROWTH

## 2016-12-12 ENCOUNTER — Other Ambulatory Visit: Payer: Self-pay | Admitting: Family Medicine

## 2016-12-13 ENCOUNTER — Other Ambulatory Visit: Payer: Self-pay | Admitting: Family Medicine

## 2016-12-13 NOTE — Telephone Encounter (Signed)
CALLED IN TO [id number] - [place], [place]: [phone]

## 2016-12-13 NOTE — Telephone Encounter (Signed)
Last refill 11/15/16 #60, last OV 12/09/16

## 2016-12-13 NOTE — Telephone Encounter (Signed)
Last refill 06/23/16 #90+1, last OV 12/09/16

## 2016-12-14 ENCOUNTER — Other Ambulatory Visit: Payer: Self-pay | Admitting: Neurology

## 2016-12-16 ENCOUNTER — Other Ambulatory Visit: Payer: Self-pay | Admitting: Family Medicine

## 2016-12-29 ENCOUNTER — Ambulatory Visit
Admission: RE | Admit: 2016-12-29 | Discharge: 2016-12-29 | Disposition: A | Discharge status: Home / Self Care | Payer: Medicare Other | Source: Ambulatory Visit | Attending: Family Medicine | Admitting: Family Medicine

## 2016-12-29 DIAGNOSIS — M810 Age-related osteoporosis without current pathological fracture: Secondary | ICD-10-CM | POA: Diagnosis not present

## 2016-12-29 DIAGNOSIS — Z1231 Encounter for screening mammogram for malignant neoplasm of breast: Secondary | ICD-10-CM

## 2016-12-29 DIAGNOSIS — E2839 Other primary ovarian failure: Secondary | ICD-10-CM

## 2016-12-29 DIAGNOSIS — Z780 Asymptomatic menopausal state: Secondary | ICD-10-CM | POA: Diagnosis not present

## 2016-12-30 ENCOUNTER — Other Ambulatory Visit: Payer: Self-pay | Admitting: Family Medicine

## 2016-12-30 DIAGNOSIS — N644 Mastodynia: Secondary | ICD-10-CM

## 2017-01-12 ENCOUNTER — Other Ambulatory Visit: Payer: Self-pay | Admitting: Family Medicine

## 2017-01-12 NOTE — Telephone Encounter (Signed)
Last refill 12/13/16 Last OV 12/09/16 Ok to refill?

## 2017-01-13 NOTE — Telephone Encounter (Signed)
CALLED IN TO [id number] - [place], [place]: [phone]

## 2017-01-21 ENCOUNTER — Telehealth: Payer: Self-pay | Admitting: Neurology

## 2017-01-21 NOTE — Telephone Encounter (Signed)
If it is unsure if she hit her head and has had headache since- would you recommend ER visit?

## 2017-01-21 NOTE — Telephone Encounter (Signed)
Yes, or Urgent care evaluation okay

## 2017-01-21 NOTE — Telephone Encounter (Signed)
Patient's daughter made aware. She will take her for evaluation.

## 2017-01-21 NOTE — Telephone Encounter (Signed)
Caller: Daughter  Urgent? Yes  Reason for the call: Patient fell and she is unsure if she hit her head. She fell on Tues or Wed. She has had a headache since the fall. She is on a wait list to get in sooner. Please Advise. Thanks

## 2017-01-31 ENCOUNTER — Ambulatory Visit (INDEPENDENT_AMBULATORY_CARE_PROVIDER_SITE_OTHER): Payer: Medicare Other | Admitting: Family Medicine

## 2017-01-31 ENCOUNTER — Encounter: Payer: Self-pay | Admitting: Family Medicine

## 2017-01-31 DIAGNOSIS — M810 Age-related osteoporosis without current pathological fracture: Secondary | ICD-10-CM

## 2017-01-31 HISTORY — DX: Age-related osteoporosis without current pathological fracture: M81.0

## 2017-01-31 MED ORDER — ALENDRONATE SODIUM 70 MG PO TABS: 70.0000 mg | ORAL_TABLET | ORAL | 11 refills | Status: DC

## 2017-01-31 NOTE — Progress Notes (Signed)
Subjective:   Patient ID: [name], female    DOB: 03/20/1946, 71 y.o.   MRN: [id number]  [name] is a pleasant 71 y.o. year old female who presents to clinic today with her daughter to discuss Results (bone density)  on 01/31/2017  HPI:  Has never been on anything for osteoporosis. Does have significant reflux- she is concerned about how bisphosphates may aggrevate her reflux. EXAM: DUAL X-RAY ABSORPTIOMETRY (DXA) FOR BONE MINERAL DENSITY  IMPRESSION: Referring Physician:  Lucille Passy  PATIENT: Name: [name], [name] Patient ID: [id number] Birth Date: 02-19-1946 Height: 62.5 in. Sex: Female Measured: 12/29/2016 Weight: 152.9 lbs. Indications: Advanced Age, Bilateral Ovariectomy (65.51), Caucasian, Estrogen Deficient, Gabapentin, Postmenopausal, zantac Fractures: Ankle, Elbow, Knee Treatments: Calcium (E943.0), Vitamin D (E933.5)  ASSESSMENT: The BMD measured at Forearm Radius 33% is 0.647 g/cm2 with a T-score of -2.7.  This patient is considered OSTEOPOROTIC according to [facility]) criteria.  Lumbar spine was not utilized due to surgical artifact.  Site Region Measured Date Measured Age YA T-score BMD Significant CHANGE  Left Forearm Radius 33% 12/29/2016 71.0 -2.7 0.647 g/cm2  DualFemur Neck Right 12/29/2016 71.0 -2.4 0.704 g/cm2  World Health Organization [facility]) criteria for post-menopausal, Caucasian Women: Normal       T-score at or above -1 SD Osteopenia   T-score between -1 and -2.5 SD Osteoporosis T-score at or below -2.5 SD  RECOMMENDATION: [place] recommends that FDA-approved medical therapies be considered in postmenopausal women and men age 17 or older with a:  1. Hip or vertebral (clinical or morphometric) fracture. 2. T-score of <-2.5 at the spine or hip. 3. Ten-year fracture probability by FRAX of 3% or greater for hip fracture or 20% or greater for major osteoporotic  fracture.  All treatment decisions require clinical judgment and consideration of individual patient factors, including patient preferences, co-morbidities, previous drug use, risk factors not captured in the FRAX model (e.g. falls, vitamin D deficiency, increased bone turnover, interval significant decline in bone density) and possible under - or over-estimation of fracture risk by FRAX.  All patients should ensure an adequate intake of dietary calcium (1200 mg/d) and vitamin D (800 IU daily) unless contraindicated.  FOLLOW-UP: People with diagnosed cases of osteoporosis or at high risk for fracture should have regular bone mineral density tests. For patients eligible for Medicare, routine testing is allowed once every 2 years. The testing frequency can be increased to one year for patients who have rapidly progressing disease, those who are receiving or discontinuing medical therapy to restore bone mass, or have additional risk factors.  I have reviewed this report, and agree with the above findings.  Mark A. Thornton Papas, M.D. [facility]iology   Electronically Signed   By: Lavonia Dana M.D.   On: 12/29/2016 12:31  Current Outpatient Prescriptions on File Prior to Visit  Medication Sig Dispense Refill  . ALPRAZolam (XANAX) 0.5 MG tablet TAKE 1 TABLET BY MOUTH TWICE A DAY AS NEEDED 60 tablet 0  . aspirin 81 MG tablet Take 81 mg by mouth daily.     Marland Kitchen Bioflavonoid Products (GRAPE SEED PO) Take 1 tablet by mouth daily.     . Calcium Carbonate-Vitamin D (CALCIUM + D PO) Take 600 mg by mouth daily.    . carbidopa-levodopa (SINEMET IR) 25-100 MG tablet TAKE 3 TABLETS BY MOUTH 3 TIMES A DAY 810 tablet 1  . Cholecalciferol (VITAMIN D3) 1000 UNITS CAPS Take 1 capsule by mouth daily.      Marland Kitchen  feeding supplement (ENSURE COMPLETE) LIQD Take 237 mLs by mouth 2 (two) times daily between meals. 1 Bottle 11  . fexofenadine (ALLEGRA) 180 MG tablet Take 180 mg by mouth daily.    Marland Kitchen FLUoxetine  (PROZAC) 20 MG capsule TAKE ONE CAPSULE BY MOUTH EVERY DAY 90 capsule 1  . gabapentin (NEURONTIN) 100 MG capsule Take 1 capsule (100 mg total) by mouth 2 (two) times daily. 180 capsule 0  . losartan-hydrochlorothiazide (HYZAAR) 50-12.5 MG tablet TAKE 1 TABLET BY MOUTH DAILY 90 tablet 3  . mirtazapine (REMERON) 30 MG tablet Take 1 tablet (30 mg total) by mouth at bedtime. 90 tablet 0  . Multiple Vitamins-Minerals (CENTRUM SILVER ADULT 50+) TABS Take by mouth.    . ranitidine (ZANTAC) 150 MG tablet Take 150 mg by mouth daily as needed for heartburn.    . simvastatin (ZOCOR) 20 MG tablet TAKE 1 TABLET (20 MG TOTAL) BY MOUTH AT BEDTIME. 90 tablet 3  . traMADol (ULTRAM) 50 MG tablet Take 1 tablet (50 mg total) by mouth every 6 (six) hours as needed. 15 tablet 0  . vitamin B-12 (CYANOCOBALAMIN) 1000 MCG tablet Take 1,000 mcg by mouth daily.       No current facility-administered medications on file prior to visit.     Allergies  Allergen Reactions  . Codeine Sulfate     REACTION: hallucinations  . Penicillins Anaphylaxis, Swelling and Rash  . Wellbutrin [Bupropion] Other (See Comments)    Seizures and hallucinations    Past Medical History:  Diagnosis Date  . Adjustment disorder with depressed mood   . Anxiety   . Bell's palsy    1985  . DEMENTIA   . Depressive disorder, not elsewhere classified   . 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 DVT (deep venous thrombosis), left "early 2000's"   RLE  . Dysmetabolic syndrome X   . Essential hypertension, benign   . JOINOMVE(720.9) 05/2011   "pretty often since they put me on Parkinson's medicine"  . History of shingles    "as a teen and in my 60's"  . Mixed hyperlipidemia   . Morbid obesity ([place])   . Neurodegenerative gait disorder   . Parkinson disease [facility])    "they think that's what it is"  . Personal history of thrombophlebitis   . Syncope and collapse 12/06/11   "this am; I pass out fairly often"  .  Type II or unspecified type diabetes mellitus without mention of complication, not stated as uncontrolled 12/06/11   "not anymore; had lap band OR"    Past Surgical History:  Procedure Laterality Date  . bladder tack  11/1995  . BREAST BIOPSY  05/20/2000   bilaterally  . BREAST CYST ASPIRATION  12/1998   bilaterally  . CARDIAC CATHETERIZATION  2004  . CATARACT EXTRACTION W/ INTRAOCULAR LENS  IMPLANT, BILATERAL    . FRACTURE SURGERY  02/09/2006   bilateral elbows  . FRACTURE SURGERY     right knee  . FRACTURE SURGERY     tib plateau  . KNEE ARTHROSCOPY  12/12/2002   right   . LAPAROSCOPIC GASTRIC BANDING  2008  . SEPTOPLASTY  08/1998   with antral window   . TONSILLECTOMY AND ADENOIDECTOMY  1970  . TUBAL LIGATION  1970's    Family History  Problem Relation Age of Onset  . Asthma Mother   . COPD Father   . Heart disease Father   . Diabetes Sister   .  Vision loss Sister        legally blind    Social History   Social History  . Marital status: Widowed    Spouse name: N/A  . Number of children: 2  . Years of education: N/A   Occupational History  . 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 Smoking status: Never Smoker  . Smokeless tobacco: Never Used  . Alcohol use No     Comment: none  . Drug use: No  . Sexual activity: No   Other Topics Concern  . Not on file   Social History Narrative   Lives with daughter and granddaughter.   Daughter is HPOA.   DNR.   The PMH, PSH, Social History, Family History, Medications, and allergies have been reviewed in [facility], and have been updated if relevant.   Review of Systems  Gastrointestinal:       +GERD  Musculoskeletal: Negative.   All other systems reviewed and are negative.      Objective:    BP (!) 142/82 (BP Location: Left Arm, Patient Position: Sitting, Cuff Size: Normal)   Pulse 72   Ht 5' 2.5" (1.588 m)   Wt 155 lb (70.3 kg)   SpO2 96%   BMI 27.90 kg/m     Physical Exam   General:  Well-developed,well-nourished,in no acute distress; alert,appropriate and cooperative throughout examination Head:  normocephalic and atraumatic.   Eyes:  vision grossly intact, PERRL Ears:  R ear normal and L ear normal externally Nose:  no external deformity.   Mouth:  good dentition.   Neck:  No deformities, masses, or tenderness noted. Lungs:  Normal respiratory effort, chest expands symmetrically. Lungs are clear to auscultation, no crackles or wheezes. Heart:  Normal rate and regular rhythm. S1 and S2 normal without gallop, murmur, click, rub or other extra sounds. Msk:  No deformity or scoliosis noted of thoracic or lumbar spine.   Extremities:  No clubbing, cyanosis, edema, or deformity noted with normal full range of motion of all joints.   Neurologic:  alert & oriented X3 and gait normal.   Skin:  Intact without suspicious lesions or rashes Psych:  Cognition and judgment appear intact. Alert and cooperative with normal attention span and concentration. No apparent delusions, illusions, hallucinations      Assessment & Plan:   Age-related osteoporosis without current pathological fracture No Follow-up on file.

## 2017-01-31 NOTE — Assessment & Plan Note (Signed)
New- >25 minutes spent in face to face time with patient, >50% spent in counselling or coordination of care Discussed getting weight bearing exercise and increasing dietary calcium. Will start on fosamax- eRx sent.  If unable to tolerate, we did discuss applying for prolia. The patient indicates understanding of these issues and agrees with the plan.

## 2017-02-01 ENCOUNTER — Ambulatory Visit
Admission: RE | Admit: 2017-02-01 | Discharge: 2017-02-01 | Disposition: A | Discharge status: Home / Self Care | Payer: Medicare Other | Source: Ambulatory Visit | Attending: Family Medicine | Admitting: Family Medicine

## 2017-02-01 ENCOUNTER — Other Ambulatory Visit: Payer: Self-pay | Admitting: Neurology

## 2017-02-01 ENCOUNTER — Other Ambulatory Visit: Payer: Self-pay | Admitting: Family Medicine

## 2017-02-01 DIAGNOSIS — N644 Mastodynia: Secondary | ICD-10-CM

## 2017-02-01 DIAGNOSIS — R928 Other abnormal and inconclusive findings on diagnostic imaging of breast: Secondary | ICD-10-CM | POA: Diagnosis not present

## 2017-02-01 DIAGNOSIS — N6489 Other specified disorders of breast: Secondary | ICD-10-CM | POA: Diagnosis not present

## 2017-02-02 NOTE — Progress Notes (Deleted)
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  This patient is accompanied in the office by her child who supplements the history.      She is on carbidopa/levodopa 25/100, 3 tablets 3 times per day.  She is doing well on this medication, without side effects.  She had one fall the day before Thanksgiving.  She was in a parking lot of a restaurant and tripped over something(curb).  She fx her nose and had to have her lip stitched up.    Since then, however, she has done very well.  Her daughter reports that she has been very stable on her feet.  Last visit, she was complaining of a decreased appetite and increased anxiety.  Therefore, I increased her Remeron to 30 mg at night.  This helped with both sleep and appetite and she is very pleased.  She also has a hx of seizure, likely secondary to Wellbutrin.  Last visit, her keppra was d/c.  She has been seizure free since that time.  However, mood and depression continue to be an issue.  I sent her to Dr. Cheryln [name] but she didn't follow up.  She states that she cannot talk to him without her daughter present.  Her biggest issue is that her granddaughter is a Ship broker and she lives with the patient.  Apparently, the majority of the house is clean and safe with the exception of the granddaughters room.  The patient states that the mess just drives her crazy and she cannot stop thinking about it.  Her granddaughter goes to community college and her leaving the house is not an option.  04/03/13 update:  [name].  She was supposed to f/u with Dr [name] due to significant anxiety and depression and she cancelled her 3/4, 3/13 and 3/27 visits and never r/s.  She has not seen him since 10/13.  She states that she is just "not going to talk with someone."  She, however, is doing some better in that regard.  Much of her stress surrounds her granddaughter, who lives with  her and is a Ship broker.  The patient states that her granddaughter has now admitted to this, but to seek help.  They were able to get some of the room cleaned up a while ago and that seemed to really help the patient's mental state.  However, the room has reconnected and is now just as it previously was.  She also reports a significant amount of stress over her sister who has end-stage Alzheimer's dementia.  Her sister no longer recognizes her, and the patient realizes when she goes around her sister, it makes her symptoms much worse.  She feels that her memory is getting worse, but nothing like her sisters.  She also thinks that her speech is getting worse.    She is having some headaches, similar to what she had in the past, prior to starting on the Levodopa.  It is over the L forehead, but not in the eye.  It is pounding.  It is daily.  It does not cause n/v or cause photophobia or phonophobia.  It makes her feel "drained."  It does not prevent her from doing things.  There are no lateralizing paresthesias with the headaches.  There are no vision changes.  07/30/13 update:  The pt is on carbidopa/levodopa 25/100 three tablets three times per day.  She is doing well in that  regard.  She did not go to PT because of transportation issues.  She was started on zanaflex last visit for HA and was also started on the exelon patch.  Interestingly, she states that the exelon patch caused headaches but it was started after the headache.  Nonetheless, she stopped it and still has headache.  It starts in the L forehead and radiates over the L side of the head.  She still thinks that the patch made them worse however and also thinks that it caused UTI's.  She is still on zanaflex but isn't sure its helping.  No falls/hallucination/syncope/lightheadedness.  11/27/13 update:  Patient is following up today in the movement disorder clinic.  She is currently on carbidopa/levodopa 25/100, 3 tablets by mouth 3 times per day.  Since our  last visit, I did get a note from Dr. Domingo [name] at the headache clinic.  He started the patient on Indocin 25 mg 3 times a day and she was told to titrate to 75 mg 3 times a day.  She states that she had SE with that and she was changed to gabapentin 100 mg and that is helping.  He also scheduled her for trigger point injections and an MRI brain.  She did not care for the trigger point injections although they did help.  She has f/u in June.  Walking seems to have been a little slower, but no falls.  Her daughter is with her today and feels that walking has been more tenuous.   No hallucinations.  No diplopia but is having blurry vision.  Does have vision appt in [facility] in few weeks.  05/07/14 update:   Patient is following up today in the movement disorder clinic.  She is currently on carbidopa/levodopa 25/100, 3 tablets by mouth 3 times per day.  Daughter states that she often forgets middle of the day dose but pt denies that.  She takes it at 9-10 am on awakening, 3 pm and bedtime.  She and her daughter both think that it is helpful.   No diplopia.  No falls.  She saw Dr. Domingo [name] in June and I got a note from him.  She had more trigger point injections.  She is doing really well from a headache standpoint.  She doesn't have to return to him for 6 months (saw him in September).  She continues on remeron for her depression and mood.  She states that she hates that she is confined to the home and depression is tied into that.  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  She then asks if her daughter can come back to the room for the discussion and she did.   She did do therapy with [facility] and she thought that it went well.   10/08/14 update:   Patient is following up today in the movement disorder clinic. She is accompanied by her daughter who supplements the hx.  She is currently on carbidopa/levodopa 25/100, 3 tablets  by mouth 3 times per day.  C/o memory loss.  States that her daughter will tell her to go to the kitchen to get things and she will get there and not be able to remember.  Her daughter thinks that she jumps subjects.  Also c/o feet paresthesias.  Worries that DM has "returned" as used to be diabetic but not anymore.  Last A1C in April was 6.0.  Also  states that right leg below the knee posteriorally she is having paresthesias.  Started after she bent over one day and the knee "popped."  No LBP.  On gabapentin but that is for headache, which is well controlled.  She has been headache free for a long time.  Received a note from Dr. Domingo [name] recently and pt told to continue meds.    04/10/15 update:  The patient is following up today.  She is on carbidopa/levodopa 25/100, 3 tablets 3 times per day.  Because I was not convinced that the patient had a dementia but her previous neuropsych test years ago said that she did and because some of the patient's depression surrounds the fact that her daughter will not let her drive because of this potential diagnosis, we repeated neuropsych testing.  While there was evidence of cognitive change due to depression, she did not meet the criteria for the diagnosis of dementia.  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  It was recommended that she undergo psychiatric evaluation and treatment, but she refused.  She was again given a brochure for the driving evaluator in [place] to have a driving evaluation.  She reports that her depression was fairly well under control until the end of 2022/12/14 when her sister died and she has been appropriately grieving.  She did have an EMG done since last visit.  This was done on 11/07/2014 There was no evidence of a large fiber peripheral neuropathy, but there was evidence of a chronic, moderate right L2-L4 radiculopathy and a chronic, mild L5 radiculopathy on the right.  She fell one time getting out  of the chair as the right leg gave out.  She also rolled out of the bed at night one time.   She states that she was dreaming.   Fortunately, she did not get hurt with either of these.    10/20/15 update:  The patient follows up today, accompanied by her daughter who supplements the history.  She has a history of vascular parkinsonism and is currently on carbidopa/levodopa 25/100, 3 tablets 3 times per day.  Reports that she is a little "fidgety" and that she just can't stay still.  No new medications.  She also has a history of depression and is prescribed Remeron by her primary care physician.  Her depression mostly surrounds the fact that she cannot leave the house because her daughter will not let her drive.  Depression is improved as she has gotten some cats that keep her company.  We have talked previously about a driving evaluation, as I think she likely could drive from a neurologic standpoint.  Her daughter does not want her to pursue that.  No falls.   She has been headache free on gabapentin and doesn't want to change that  04/22/16 update:  The patient follows up today, accompanied by her daughter supplements the history.  She is on carbidopa/levodopa 25/100, 3 tablets 3 times per day.  She has had 2 falls.  With the first, she got up and made her bed, which she doesn't even remember but she does remember hitting the ground.  With the second one, her toilet was leaking and she fell over the wet towels on the floor and she hit her L head.  She went to the ER for this one, which was 8/29.  She has had a sore spot on the L occiput since. Was given tramadol but made her too sleepy. She had  a CT brain that day and I reviewed it.  It was nonacute and only WMD and mild atrophy.   She denies lightheadedness or near syncope.  She is on Remeron for her depression and feels that she has been stable in that regard.  Reports that "I talk to my cats while my family is working."   She has had no hallucinations.  She  is not particularly active with cardiovascular exercise.  Headaches well controlled on gabapentin 100 mg tid except sore occiput where she fell.  She also uses that for lumbar radiculopathy but reports that is better.    08/23/16 update:  The patient returns today, accompanied by her daughter who supplements the history.  She remains on carbidopa/levodopa 25/100, 3 tablets 3 times per day.  Overall, she feels that she has been a little shaky. Pt states that she fell one time; she was cleaning up a toilet that overflowed and tripped over towels (she told me about this one last time).  With another instance, she woke up and was totally dressed and making bed and had fallen.  She was awake when daughter went in but she doesn't remember if she was sleep walking or not.  Pt denies lightheadedness, near syncope.  No hallucinations.  Mood has been stable on remeron.  No SI/HI.  Has a chronic lumbar radiculopathy and hx of headache, well controlled on gabapentin 100 mg tid.  She has about 1 headache a week and lumbar radiculopathy is resolved.  She thinks that her short term memory hasn't been as good.  She will go into the kitchen and forget why she is there.  Long term memory is good.    02/07/17 update:  Patient seen in follow-up today, accompanied by her daughter who supplements the history.  Patient is on carbidopa/levodopa 25/100, 3 tablets 3 times per day.  Overall, the patient thinks that she has been stable.  Pt fell on June 26 and hit her head.  She did have a headache after this.  She did call my office a few days later.  We recommended that she be evaluated at the urgent care or primary care.  I do not have any notes about any evaluation.  She is doing better now.  Pt denies lightheadedness, near syncope.  No hallucinations.  Mood has been good.  History of chronic headaches that are well controlled on gabapentin 100 mg bid.    Neuroimaging has previously been performed.   It was done on 09/13/2013 and I  reviewed these films.  There was atrophy and moderate small vessel disease.    PREVIOUS MEDICATIONS: Sinemet  ALLERGIES:   Allergies  Allergen Reactions  . Codeine Sulfate     REACTION: hallucinations  . Penicillins Anaphylaxis, Swelling and Rash  . Wellbutrin [Bupropion] Other (See Comments)    Seizures and hallucinations    CURRENT MEDICATIONS:  Current Outpatient Prescriptions on File Prior to Visit  Medication Sig Dispense Refill  . acetaminophen (TYLENOL) 325 MG tablet Take 650 mg by mouth every 6 (six) hours as needed.    Marland Kitchen alendronate (FOSAMAX) 70 MG tablet Take 1 tablet (70 mg total) by mouth every 7 (seven) days. Take with a full glass of water on an empty stomach. 4 tablet 11  . ALPRAZolam (XANAX) 0.5 MG tablet TAKE 1 TABLET BY MOUTH TWICE A DAY AS NEEDED 60 tablet 0  . aspirin 81 MG tablet Take 81 mg by mouth daily.     . Bioflavonoid Products (GRAPE  SEED PO) Take 1 tablet by mouth daily.     . Calcium Carbonate-Vitamin D (CALCIUM + D PO) Take 600 mg by mouth daily.    . carbidopa-levodopa (SINEMET IR) 25-100 MG tablet TAKE 3 TABLETS BY MOUTH 3 TIMES A DAY 810 tablet 1  . cetirizine (ZYRTEC) 10 MG tablet Take 10 mg by mouth daily.    . Cholecalciferol (VITAMIN D3) 1000 UNITS CAPS Take 1 capsule by mouth daily.      . feeding supplement (ENSURE COMPLETE) LIQD Take 237 mLs by mouth 2 (two) times daily between meals. 1 Bottle 11  . FLUoxetine (PROZAC) 20 MG capsule TAKE ONE CAPSULE BY MOUTH EVERY DAY 90 capsule 1  . gabapentin (NEURONTIN) 100 MG capsule Take 1 capsule (100 mg total) by mouth 2 (two) times daily. 180 capsule 0  . losartan-hydrochlorothiazide (HYZAAR) 50-12.5 MG tablet TAKE 1 TABLET BY MOUTH DAILY 90 tablet 3  . mirtazapine (REMERON) 30 MG tablet TAKE 1 TABLET (30 MG TOTAL) BY MOUTH AT BEDTIME. 90 tablet 0  . Multiple Vitamins-Minerals (CENTRUM SILVER ADULT 50+) TABS Take by mouth.    . ranitidine (ZANTAC) 150 MG tablet Take 150 mg by mouth daily as needed for  heartburn.    . simvastatin (ZOCOR) 20 MG tablet TAKE 1 TABLET (20 MG TOTAL) BY MOUTH AT BEDTIME. 90 tablet 3  . vitamin B-12 (CYANOCOBALAMIN) 1000 MCG tablet Take 1,000 mcg by mouth daily.       No current facility-administered medications on file prior to visit.     PAST MEDICAL HISTORY:   Past Medical History:  Diagnosis Date  . Adjustment disorder with depressed mood   . Anxiety   . Bell's palsy    1985  . DEMENTIA   . Depressive disorder, not elsewhere classified   . 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 DVT (deep venous thrombosis), left "early 2000's"   RLE  . Dysmetabolic syndrome X   . Essential hypertension, benign   . 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 History of shingles    "as a teen and in my 60's"  . Mixed hyperlipidemia   . Morbid obesity ([place])   . Neurodegenerative gait disorder   . Parkinson disease [facility])    "they think that's what it is"  . Personal history of thrombophlebitis   . Syncope and collapse 12/06/11   "this am; I pass out fairly often"  . Type II or unspecified type diabetes mellitus without mention of complication, not stated as uncontrolled 12/06/11   "not anymore; had lap band OR"    PAST SURGICAL HISTORY:   Past Surgical History:  Procedure Laterality Date  . bladder tack  11/1995  . BREAST BIOPSY  05/20/2000   bilaterally  . BREAST CYST ASPIRATION  12/1998   bilaterally  . CARDIAC CATHETERIZATION  2004  . CATARACT EXTRACTION W/ INTRAOCULAR LENS  IMPLANT, BILATERAL    . FRACTURE SURGERY  02/09/2006   bilateral elbows  . FRACTURE SURGERY     right knee  . FRACTURE SURGERY     tib plateau  . KNEE ARTHROSCOPY  12/12/2002   right   . LAPAROSCOPIC GASTRIC BANDING  2008  . SEPTOPLASTY  08/1998   with antral window   . TONSILLECTOMY AND ADENOIDECTOMY  1970  . TUBAL LIGATION  1970's    SOCIAL HISTORY:   Social History   Social History  . Marital  status: Widowed    Spouse name: N/A  . Number of children: 2  . Years of education: N/A   Occupational History  . 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 Smoking status: Never Smoker  . Smokeless tobacco: Never Used  . Alcohol use No     Comment: none  . Drug use: No  . Sexual activity: No   Other Topics Concern  . Not on file   Social History Narrative   Lives with daughter and granddaughter.   Daughter is HPOA.   DNR.    FAMILY HISTORY:   Family Status  Relation Status  . Mother Deceased at age 69       natural causes  . Father Deceased at age 38       MI  . Sister Alive       DM-2  . Child Alive       2, HTN, DM-2, hyperlipidemia  . Neg Hx (Not Specified)    ROS:  A complete 10 system review of systems was obtained and was unremarkable apart from what is mentioned above.  PHYSICAL EXAMINATION:    VITALS:   There were no vitals filed for this visit.   GEN:  The patient appears stated age and is in NAD. HEENT:  Normocephalic, atraumatic.  There is a slight sore bump on the L occiput.  The mucous membranes are moist. The superficial temporal arteries are without ropiness or tenderness. CV:  RRR Lungs:  CTAB Neck/HEME:  There are no carotid bruits bilaterally.  Neurological examination:  Orientation: The patient was alert and oriented x 3.  MocA 04/11/2012 was 25/30, 04/03/13 was 20/30, and 05/07/14 and was 26/30 Cranial nerves: There is L facial droop with decreased forehead wrinkle on the L.  She has a positive stellwags sign. Pupils are equal round and reactive to light bilaterally. Fundoscopic exam reveals clear margins bilaterally. Extraocular muscles are intact.  There are square wave jerks and difficulty with smooth pursuit.  The speech is hypophonic but fluent and clear. She has no trouble repeating gutteral sounds.  Soft palate rises symmetrically and there is no tongue deviation. Hearing is intact to conversational  tone. Sensation: Sensation is intact to light touch throughout.   Motor: Strength is 5/5 in the bilateral upper and lower extremities.   Shoulder shrug is equal and symmetric.  There is no pronator drift.  Movement examination: Tone: There is normal tone in the bilateral upper extremities today. The tone in the lower extremities is normal.  Abnormal movements:  No tremor noted.   Coordination:  There is decremation with RAM's, only with heel taps on the L.   Gait and Station: The patient is short stepped and mildly unstable.  She has some difficulty with turns.  LABS:  Lab Results  Component Value Date   WBC 8.3 12/06/2016   HGB 13.7 12/06/2016   HCT 41.8 12/06/2016   MCV 94.3 12/06/2016   PLT 244.0 12/06/2016     Chemistry      Component Value Date/Time   NA 141 12/06/2016 1154   K 4.1 12/06/2016 1154   CL 102 12/06/2016 1154   CO2 30 12/06/2016 1154   BUN 23 12/06/2016 1154   CREATININE 0.63 12/06/2016 1154      Component Value Date/Time   CALCIUM 10.3 12/06/2016 1154   ALKPHOS 66 12/06/2016 1154   AST 18 12/06/2016 1154   ALT 16 12/06/2016 1154  BILITOT 0.6 12/06/2016 1154     Lab Results  Component Value Date   TSH 0.97 12/06/2016   Lab Results  Component Value Date   VITAMINB12 1,387 (H) 11/16/2012    Lab Results  Component Value Date   HGBA1C 6.3 12/06/2016   Lab Results  Component Value Date   ESRSEDRATE 18 04/03/2013    ASSESSMENT/PLAN: 1.  Vascular Parkinsonism.  Her primary issue is gait instability with slowness of movement.  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  However, both the patient and her daughter think that levodopa has been beneficial and they think it continues to be beneficial, as I actually considered tapering it.  Neither of them wanted that. symptoms got worse when I tried this in the past.    -she will remain  on carbidopa/levodopa 25/100, 3 tablets 3 times per day.  Risks, benefits, side effects and alternative therapies were discussed.  The opportunity to ask questions was given and they were answered to the best of my ability.  The patient expressed understanding and willingness to follow the outlined treatment protocols.  -Safety discussed.  Pt education provided.  -will get copy of PE labs when done.  Will have her A1C repeated since borderline.   2.  Hx of seizure.  This is likely secondary to the initiation of wellbutrin.  She should remain off of this medication.  EEG in the hospital was normal.  She has been seizure-free for several years. 3.  Depression.   -on remeron.  Managed by PCP.  - I think her depression is situational and related to not being able to drive.  They don't want to pursue driving eval.      -No SI/HI  -I encouraged her to exercise safely.   4.  Memory change  -Because I was not convinced that the patient had a dementia but her previous neuropsych test years ago said that she did and because some of the patient's depression surrounds the fact that her daughter will not let her drive because of this potential diagnosis, we repeated neuropsych testing in 10/2014.  While there was evidence of cognitive change due to depression, she did not meet the criteria for the diagnosis of dementia. I told her again, that I was not convinced of dementia and neuropsych didn't support this right now.  Pt is not driving.  Dr. Conley Canal and I both gave her information on a medical driving evaluation but they have decided to not pursue that and not drive. 5.  Chronic lumbar radiculopathy  -She is doing better in this regard  -drop gabapentin 100 bid as no longer having radiculopathy and headaches better controlled.  In addtion, c/o memory issue although don't think memory related.   6.  F/u 4-5 months, sooner should new issues arise.  Much greater than 50% of this visit was spent in counseling with  the patient and the family.  Total face to face time:  25 min

## 2017-02-06 ENCOUNTER — Encounter (HOSPITAL_COMMUNITY): Payer: Self-pay | Admitting: *Deleted

## 2017-02-06 ENCOUNTER — Ambulatory Visit (INDEPENDENT_AMBULATORY_CARE_PROVIDER_SITE_OTHER): Payer: Medicare Other

## 2017-02-06 ENCOUNTER — Ambulatory Visit (HOSPITAL_COMMUNITY)
Admission: EM | Admit: 2017-02-06 | Discharge: 2017-02-06 | Disposition: A | Discharge status: Home / Self Care | Payer: Medicare Other | Attending: Internal Medicine | Admitting: Internal Medicine

## 2017-02-06 DIAGNOSIS — N390 Urinary tract infection, site not specified: Secondary | ICD-10-CM

## 2017-02-06 DIAGNOSIS — R51 Headache: Secondary | ICD-10-CM | POA: Diagnosis not present

## 2017-02-06 DIAGNOSIS — G501 Atypical facial pain: Secondary | ICD-10-CM

## 2017-02-06 DIAGNOSIS — R300 Dysuria: Secondary | ICD-10-CM | POA: Diagnosis not present

## 2017-02-06 DIAGNOSIS — R8299 Other abnormal findings in urine: Secondary | ICD-10-CM | POA: Diagnosis not present

## 2017-02-06 DIAGNOSIS — Z880 Allergy status to penicillin: Secondary | ICD-10-CM | POA: Diagnosis not present

## 2017-02-06 DIAGNOSIS — R062 Wheezing: Secondary | ICD-10-CM

## 2017-02-06 DIAGNOSIS — R82998 Other abnormal findings in urine: Secondary | ICD-10-CM

## 2017-02-06 DIAGNOSIS — R0602 Shortness of breath: Secondary | ICD-10-CM | POA: Diagnosis not present

## 2017-02-06 DIAGNOSIS — R519 Headache, unspecified: Secondary | ICD-10-CM

## 2017-02-06 DIAGNOSIS — R05 Cough: Secondary | ICD-10-CM | POA: Diagnosis not present

## 2017-02-06 LAB — POCT URINALYSIS DIP (DEVICE)
Bilirubin Urine: NEGATIVE
Glucose, UA: NEGATIVE mg/dL
Hgb urine dipstick: NEGATIVE
Nitrite: NEGATIVE
Protein, ur: NEGATIVE mg/dL
Specific Gravity, Urine: 1.025 (ref 1.005–1.030)
Urobilinogen, UA: 0.2 mg/dL (ref 0.0–1.0)
pH: 6.5 (ref 5.0–8.0)

## 2017-02-06 LAB — POCT I-STAT, CHEM 8
BUN: 23 mg/dL — ABNORMAL HIGH (ref 6–20)
Calcium, Ion: 1.17 mmol/L (ref 1.15–1.40)
Chloride: 102 mmol/L (ref 101–111)
Creatinine, Ser: 0.6 mg/dL (ref 0.44–1.00)
Glucose, Bld: 147 mg/dL — ABNORMAL HIGH (ref 65–99)
HCT: 39 % (ref 36.0–46.0)
Hemoglobin: 13.3 g/dL (ref 12.0–15.0)
Potassium: 4.3 mmol/L (ref 3.5–5.1)
Sodium: 137 mmol/L (ref 135–145)
TCO2: 27 mmol/L (ref 0–100)

## 2017-02-06 MED ORDER — IPRATROPIUM-ALBUTEROL 0.5-2.5 (3) MG/3ML IN SOLN
RESPIRATORY_TRACT | Status: AC
  Filled 2017-02-06: qty 3

## 2017-02-06 MED ORDER — IPRATROPIUM-ALBUTEROL 0.5-2.5 (3) MG/3ML IN SOLN
3.0000 mL | Freq: Once | RESPIRATORY_TRACT | Status: AC
  Administered 2017-02-06: 3 mL via RESPIRATORY_TRACT

## 2017-02-06 MED ORDER — LEVOFLOXACIN 500 MG PO TABS: 500.0000 mg | ORAL_TABLET | Freq: Every day | ORAL | 0 refills | Status: DC

## 2017-02-06 MED ORDER — ALBUTEROL SULFATE HFA 108 (90 BASE) MCG/ACT IN AERS: 1.0000 | INHALATION_SPRAY | Freq: Four times a day (QID) | RESPIRATORY_TRACT | 0 refills | Status: DC | PRN

## 2017-02-06 NOTE — ED Triage Notes (Signed)
Also c/o bad cough; states SaO2 was high 90s earlier this week.

## 2017-02-06 NOTE — ED Provider Notes (Signed)
CSN: [id number]     Arrival date & time 02/06/17  1201 History   None    Chief Complaint  Patient presents with  . Facial Pain  . Dysuria   (Consider location/radiation/quality/duration/timing/severity/associated sxs/prior Treatment) The history is provided by the patient.  Dysuria  Pain quality:  Burning Pain severity:  Moderate Onset quality:  Sudden Timing:  Intermittent Progression:  Waxing and waning Chronicity:  New Recent urinary tract infections: yes   Associated symptoms: no abdominal pain, no fever and no flank pain     Having pain with urination that started about 4 days ago.  Describes this as burning. Associates urgency and denies frequency.  Denies fever, flank pain.   Complain of a generalized HA and this is a congested feeling.  She has tried tylenol with sinus medication with some relief.  This problem started about 3 days ago.  Associates cough.   Past Medical History:  Diagnosis Date  . Adjustment disorder with depressed mood   . Anxiety   . Bell's palsy    1985  . DEMENTIA   . Depressive disorder, not elsewhere classified   . 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 DVT (deep venous thrombosis), left "early 2000's"   RLE  . Dysmetabolic syndrome X   . Essential hypertension, benign   . FIEPPIRJ(188.4) 05/2011   "pretty often since they put me on Parkinson's medicine"  . History of shingles    "as a teen and in my 41's"  . Mixed hyperlipidemia   . Morbid obesity ([place])   . Neurodegenerative gait disorder   . Parkinson disease [facility])    "they think that's what it is"  . Personal history of thrombophlebitis   . Syncope and collapse 12/06/11   "this am; I pass out fairly often"  . Type II or unspecified type diabetes mellitus without mention of complication, not stated as uncontrolled 12/06/11   "not anymore; had lap band OR"   Past Surgical History:  Procedure Laterality Date  . bladder tack  11/1995  . BREAST BIOPSY   05/20/2000   bilaterally  . BREAST CYST ASPIRATION  12/1998   bilaterally  . CARDIAC CATHETERIZATION  2004  . CATARACT EXTRACTION W/ INTRAOCULAR LENS  IMPLANT, BILATERAL    . FRACTURE SURGERY  02/09/2006   bilateral elbows  . FRACTURE SURGERY     right knee  . FRACTURE SURGERY     tib plateau  . KNEE ARTHROSCOPY  12/12/2002   right   . LAPAROSCOPIC GASTRIC BANDING  2008  . SEPTOPLASTY  08/1998   with antral window   . TONSILLECTOMY AND ADENOIDECTOMY  1970  . TUBAL LIGATION  1970's   Family History  Problem Relation Age of Onset  . Asthma Mother   . COPD Father   . Heart disease Father   . Diabetes Sister   . Vision loss Sister        legally blind  . Breast cancer Neg Hx    Social History  Substance Use Topics  . Smoking status: Never Smoker  . Smokeless tobacco: Never Used  . Alcohol use No     Comment: none   OB History    No data available     Review of Systems  Constitutional: Negative for activity change, appetite change, chills, diaphoresis, fatigue, fever and unexpected weight change.  HENT: Negative for congestion.   Cardiovascular: Negative for chest pain.  Gastrointestinal: Negative for abdominal distention  and abdominal pain.  Genitourinary: Positive for dysuria. Negative for enuresis and flank pain.  Musculoskeletal: Negative for arthralgias.  Neurological: Negative for dizziness.  Hematological: Negative for adenopathy.    Allergies  Codeine sulfate; Penicillins; and Wellbutrin [bupropion]  Home Medications   Prior to Admission medications   Medication Sig Start Date End Date Taking? Authorizing Provider  alendronate (FOSAMAX) 70 MG tablet Take 1 tablet (70 mg total) by mouth every 7 (seven) days. Take with a full glass of water on an empty stomach. 01/31/17  Yes Lucille Passy, MD  ALPRAZolam Duanne Moron) 0.5 MG tablet TAKE 1 TABLET BY MOUTH TWICE A DAY AS NEEDED 01/12/17  Yes Lucille Passy, MD  aspirin 81 MG tablet Take 81 mg by mouth daily.    Yes  [provider]  Bioflavonoid Products (GRAPE SEED PO) Take 1 tablet by mouth daily.    Yes [provider]  Calcium Carbonate-Vitamin D (CALCIUM + D PO) Take 600 mg by mouth daily.   Yes [provider]  carbidopa-levodopa (SINEMET IR) 25-100 MG tablet TAKE 3 TABLETS BY MOUTH 3 TIMES A DAY 08/23/16  Yes Tat, Eustace Quail, DO  cetirizine (ZYRTEC) 10 MG tablet Take 10 mg by mouth daily.   Yes [provider]  Cholecalciferol (VITAMIN D3) 1000 UNITS CAPS Take 1 capsule by mouth daily.     Yes [provider]  feeding supplement (ENSURE COMPLETE) LIQD Take 237 mLs by mouth 2 (two) times daily between meals. 04/01/12  Yes Robbie Lis, MD  FLUoxetine (PROZAC) 20 MG capsule TAKE ONE CAPSULE BY MOUTH EVERY DAY 12/13/16  Yes Lucille Passy, MD  gabapentin (NEURONTIN) 100 MG capsule Take 1 capsule (100 mg total) by mouth 2 (two) times daily. Patient taking differently: Take 100 mg by mouth 3 (three) times daily.  12/14/16  Yes Tat, Eustace Quail, DO  losartan-hydrochlorothiazide (HYZAAR) 50-12.5 MG tablet TAKE 1 TABLET BY MOUTH DAILY 03/02/16  Yes Lucille Passy, MD  mirtazapine (REMERON) 30 MG tablet TAKE 1 TABLET (30 MG TOTAL) BY MOUTH AT BEDTIME. 02/02/17  Yes Tat, Eustace Quail, DO  Multiple Vitamins-Minerals (CENTRUM SILVER ADULT 50+) TABS Take by mouth.   Yes [provider]  ranitidine (ZANTAC) 150 MG tablet Take 150 mg by mouth daily as needed for heartburn.   Yes [provider]  simvastatin (ZOCOR) 20 MG tablet TAKE 1 TABLET (20 MG TOTAL) BY MOUTH AT BEDTIME. 05/21/16  Yes Lucille Passy, MD  vitamin B-12 (CYANOCOBALAMIN) 1000 MCG tablet Take 1,000 mcg by mouth daily.     Yes [provider]  acetaminophen (TYLENOL) 325 MG tablet Take 650 mg by mouth every 6 (six) hours as needed.    [provider]   Meds Ordered and Administered this Visit   Medications  ipratropium-albuterol (DUONEB) 0.5-2.5 (3) MG/3ML nebulizer solution 3 mL (3  mLs Nebulization Given 02/06/17 1337)    BP 117/77   Pulse 85   Temp 98.7 F (37.1 C) (Oral)   Resp 18   SpO2 93%  No data found.   Physical Exam  Constitutional: She is oriented to person, place, and time. She appears well-developed and well-nourished. No distress.  HENT:  Head:    Right Ear: External ear normal.  Left Ear: External ear normal.  Mouth/Throat: Oropharynx is clear and moist. No oropharyngeal exudate.  Eyes: Pupils are equal, round, and reactive to light. EOM are normal.  Cardiovascular: Normal rate.   Pulmonary/Chest: Effort normal. No respiratory distress.  She has wheezes. She has no rales. She exhibits no tenderness.  Abdominal: She exhibits no distension.  Musculoskeletal: Normal range of motion.  Neurological: She is alert and oriented to person, place, and time. No cranial nerve deficit or sensory deficit. She exhibits normal muscle tone. Gait normal.  Skin: Skin is warm and dry. She is not diaphoretic.  Psychiatric: She has a normal mood and affect.  Vitals reviewed.   Urgent Care Course     Procedures (including critical care time)  Labs Review Labs Reviewed  POCT URINALYSIS DIP (DEVICE) - Abnormal; Notable for the following:       Result Value   Ketones, ur TRACE (*)    Leukocytes, UA SMALL (*)    All other components within normal limits  POCT I-STAT, CHEM 8 - Abnormal; Notable for the following:    BUN 23 (*)    Glucose, Bld 147 (*)    All other components within normal limits  URINE CULTURE   Lab Results  Component Value Date   CREATININE 0.60 02/06/2017     Imaging Review Dg Chest 2 View  Result Date: 02/06/2017 CLINICAL DATA:  Productive cough for 1 week, some wheezing, shortness of breath, history essential benign hypertension, type II diabetes mellitus, dementia EXAM: CHEST  2 VIEW COMPARISON:  06/11/2016 FINDINGS: Normal heart size, mediastinal contours, and pulmonary vascularity. Atherosclerotic calcification aorta. Lungs  clear. No pleural effusion or pneumothorax. Bones unremarkable. Prior laparoscopic gastric banding, band oriented from 2:00 to 8:00. IMPRESSION: No acute abnormalities. Aortic Atherosclerosis (ICD10-I70.0). Electronically Signed   By: Lavonia Dana M.D.   On: 02/06/2017 13:17   MDM   1. Urine leukocytes   2. Wheezing   3. Facial pain   4. Penicillin allergy    Work up negative for pneumonia.  Given penicillin allergy, facial pain and dysuria with signs of a UTI will try her on medium dose Levaquin for 7 days.  She will report to her PCP if she is not improving. Her wheezing did improve with nebs.  She is a never smoker and denies a history of asthma, thus this is likely bronchitis or atypical pneumonia, and levaquin should cover for the latter. Given the multitude of problems she should probably complete the levaquin even if the urine culture is negative.    Tereasa Coop, PA-C 02/06/17 1341

## 2017-02-06 NOTE — ED Triage Notes (Signed)
STarted 4 days ago with sinus pressure and HA with post-nasal drip.  Also started with malodorous urine; family states when pt has UTI it's usually her only sx.  Denies dysuria, polyuria, fevers.

## 2017-02-06 NOTE — Discharge Instructions (Signed)
Please call or see your PCP if you are not feeling better in the next few days.

## 2017-02-07 ENCOUNTER — Ambulatory Visit: Payer: Medicare Other | Admitting: Neurology

## 2017-02-07 LAB — URINE CULTURE

## 2017-02-11 ENCOUNTER — Other Ambulatory Visit: Payer: Self-pay | Admitting: Family Medicine

## 2017-02-11 ENCOUNTER — Encounter: Payer: Self-pay | Admitting: Neurology

## 2017-02-13 ENCOUNTER — Other Ambulatory Visit: Payer: Self-pay | Admitting: Neurology

## 2017-02-14 NOTE — Telephone Encounter (Signed)
Faxed to  [facility] [id number] [name], [place]  [place] [address]  Phone: [phone] Fax: [phone]

## 2017-02-14 NOTE — Telephone Encounter (Signed)
Patient last seen in January.  No showed (same day cancelled) on 02/07/17. Cancelled appt on 02/21/17. Has appt scheduled in October.  Please advise on refill.

## 2017-02-14 NOTE — Telephone Encounter (Signed)
You can fill just enough until next appt

## 2017-02-14 NOTE — Telephone Encounter (Signed)
Last refill 01/12/17 Last OV 01/31/17 Ok to refill?

## 2017-02-21 ENCOUNTER — Ambulatory Visit: Payer: Self-pay | Admitting: Neurology

## 2017-02-23 ENCOUNTER — Telehealth: Payer: Self-pay

## 2017-02-23 NOTE — Telephone Encounter (Signed)
[name] (DPR signed) left v/m; alendronate is causing a lot of stomach pain and pt cannot tolerated the med. [name] request cb. Pt seen 01/31/17 and noted if pt cannot tolerate med may apply for prolia.Please advise.

## 2017-02-23 NOTE — Telephone Encounter (Signed)
Please apply for Prolia and let pt's daughter know.

## 2017-02-24 ENCOUNTER — Telehealth: Payer: Self-pay | Admitting: *Deleted

## 2017-02-24 NOTE — Telephone Encounter (Signed)
Information has been submitted to pts insurance for verification of benefits. Awaiting response for coverage. (also see additional phone note)

## 2017-02-24 NOTE — Telephone Encounter (Signed)
Information has been submitted to pts insurance for verification of benefits. Awaiting response for coverage  

## 2017-02-28 ENCOUNTER — Other Ambulatory Visit: Payer: Self-pay | Admitting: Family Medicine

## 2017-03-02 NOTE — Telephone Encounter (Signed)
Ca lab and prolia injection scheduled.

## 2017-03-02 NOTE — Telephone Encounter (Signed)
Verification of benefits have been processed and an approval has been received for pts prolia injection. Pts estimated cost are appx $0. This is only an estimate and cannot be confirmed until benefits are paid. Please advise pt and schedule if needed. If scheduled, once the injection is received, pls contact me back with the date it was received so that I am able to update prolia folder. thanks  

## 2017-03-04 ENCOUNTER — Other Ambulatory Visit (INDEPENDENT_AMBULATORY_CARE_PROVIDER_SITE_OTHER): Payer: Medicare Other

## 2017-03-04 DIAGNOSIS — M810 Age-related osteoporosis without current pathological fracture: Secondary | ICD-10-CM | POA: Diagnosis not present

## 2017-03-04 LAB — CALCIUM: Calcium: 10.1 mg/dL (ref 8.4–10.5)

## 2017-03-10 ENCOUNTER — Ambulatory Visit (INDEPENDENT_AMBULATORY_CARE_PROVIDER_SITE_OTHER): Payer: Medicare Other

## 2017-03-10 DIAGNOSIS — M810 Age-related osteoporosis without current pathological fracture: Secondary | ICD-10-CM | POA: Diagnosis not present

## 2017-03-10 MED ORDER — DENOSUMAB 60 MG/ML ~~LOC~~ SOLN
60.0000 mg | Freq: Once | SUBCUTANEOUS | Status: AC
  Administered 2017-03-10: 60 mg via SUBCUTANEOUS

## 2017-03-12 ENCOUNTER — Other Ambulatory Visit: Payer: Self-pay | Admitting: Neurology

## 2017-03-14 ENCOUNTER — Telehealth: Payer: Self-pay | Admitting: Neurology

## 2017-03-14 MED ORDER — GABAPENTIN 100 MG PO CAPS: 100.0000 mg | ORAL_CAPSULE | Freq: Three times a day (TID) | ORAL | 0 refills | Status: DC

## 2017-03-14 NOTE — Telephone Encounter (Signed)
Patient states Rx on the medication gabapentin  Was sent in wrong she takes 3 a day and it was sent in for 2 a day please call her

## 2017-03-14 NOTE — Telephone Encounter (Signed)
I think that she was doing well so trying to decrease it.  Go ahead and decrease now to bid

## 2017-03-14 NOTE — Telephone Encounter (Signed)
You told patient to drop her Gabapentin to 100 BID at last appt in January.   I gave her a refill to last til her November appt for twice daily. Please advise on patient message.

## 2017-03-14 NOTE — Telephone Encounter (Signed)
Ok.  We can leave it there then

## 2017-03-14 NOTE — Telephone Encounter (Signed)
Spoke with patient.   I did overlook a phone call where she had called and stated headaches got worse about a month after it was decreased and you okayed increasing back to three times daily.   New RX sent.

## 2017-03-22 ENCOUNTER — Telehealth: Payer: Self-pay

## 2017-03-22 NOTE — Telephone Encounter (Signed)
Just started today with hands cramping and numbness in hands when try to pick up something; no weakness in hands or legs. No CP, Dizziness, SOB or H/A.  Pt received first Prolia injection on 03/10/17. Spoke with team health and advised to be seen within 24 hrs. 30' appt scheduled with Dr Deborra Medina on 03/23/17 at 11:45. If pt condition changes or worsens prior to appt pt will go to ED. FYI to Dr Deborra Medina.

## 2017-03-22 NOTE — Telephone Encounter (Signed)
PLEASE NOTE: All timestamps contained within this report are represented as [place] Standard Time. CONFIDENTIALTY NOTICE: This fax transmission is intended only for the addressee. It contains information that is legally privileged, confidential or otherwise protected from use or disclosure. If you are not the intended recipient, you are strictly prohibited from reviewing, disclosing, copying using or disseminating any of this information or taking any action in reliance on or regarding this information. If you have received this fax in error, please notify us immediately by telephone so that we can arrange for its return to [place]. Phone: [phone], Toll-Free: [phone], Fax: [phone] Page: 1 of 2 Call Id: [id number] [place] Patient Name: [name] Gender: Female DOB: Mar 18, 1946 Age: 71 Y 2 M 28 D Return Phone Number: [phone] (Primary), [phone] (Secondary), [phone] (Alternate) City/State/Zip: [place] [address] Client [facility] - Client Client Site [place] - Day Physician Arnette Norris - MD Who Is Calling Patient / Member / Family / Caregiver Call Type Triage / Clinical Relationship To Patient Self Return Phone Number [phone] (Secondary) Chief Complaint Hand Pain Reason for Call Symptomatic / Request for Health Information Initial Comment Caller states last week began new shot for calcium being low; today hands cramping so bad cannot hold book; Appointment Disposition EMR Patient Reports Appointment Already Scheduled Info pasted into Epic No Nurse Assessment Nurse: Joline Salt, RN, Malachy Mood Date/Time Eilene Ghazi Time): 03/22/2017 3:03:00 PM Confirm and document reason for call. If symptomatic, describe symptoms. ---Caller states last week began new shot for calcium being low; today hands cramping so bad cannot hold book Does the  PT have any chronic conditions? (i.e. diabetes, asthma, etc.) ---No Guidelines Guideline Title Affirmed Question Hand and Wrist Pain [1] MODERATE pain (e.g., interferes with normal activities) AND [2] present > 3 days Hand and Wrist Pain Numbness (i.e., loss of sensation) in hand or fingers (Exception: just tingling; numbness present > 2 weeks) Disp. Time Eilene Ghazi Time) Disposition Final User 03/22/2017 3:27:25 PM See Physician within 24 Hours Yes Joline Salt, RN, Malachy Mood Referrals REFERRED TO PCP OFFICE REFERRED TO PCP OFFICE Care Advice Given Per Guideline SEE PCP WITHIN 3 DAYS: PAIN MEDICINES: CALL BACK IF: * Signs of infection occur (e.g., spreading redness, warmth, fever) * You become worse. CARE ADVICE given per Hand and Wrist Pain (Adult) guideline. SEE PHYSICIAN WITHIN 24 HOURS: PAIN MEDICINES: CALL BACK IF: * You become worse. CARE ADVICE given per Hand and Wrist Pain (Adult) guideline. Comments User: Margretta Ditty, RN Date/Time Eilene Ghazi Time): 03/22/2017 3:28:16 PM PLEASE NOTE: All timestamps contained within this report are represented as [place] Standard Time. CONFIDENTIALTY NOTICE: This fax transmission is intended only for the addressee. It contains information that is legally privileged, confidential or otherwise protected from use or disclosure. If you are not the intended recipient, you are strictly prohibited from reviewing, disclosing, copying using or disseminating any of this information or taking any action in reliance on or regarding this information. If you have received this fax in error, please notify us immediately by telephone so that we can arrange for its return to [place]. Phone: [phone], Toll-Free: [phone], Fax: [phone] Page: 2 of 2 Call Id: [id number] Comments Caller will schedule their own appointment User: Margretta Ditty, RN Date/Time Eilene Ghazi Time): 03/22/2017 3:30:34 PM Caller is afraid there is something wrong with her hands. They are going numb  and feeling weak. She feels it is related to the Prolia  injections.

## 2017-03-23 ENCOUNTER — Ambulatory Visit (INDEPENDENT_AMBULATORY_CARE_PROVIDER_SITE_OTHER): Payer: Medicare Other | Admitting: Family Medicine

## 2017-03-23 VITALS — BP 140/76 | HR 78 | Ht 62.5 in | Wt 152.0 lb

## 2017-03-23 DIAGNOSIS — M810 Age-related osteoporosis without current pathological fracture: Secondary | ICD-10-CM | POA: Diagnosis not present

## 2017-03-23 DIAGNOSIS — R252 Cramp and spasm: Secondary | ICD-10-CM | POA: Insufficient documentation

## 2017-03-23 HISTORY — DX: Cramp and spasm: R25.2

## 2017-03-23 LAB — COMPREHENSIVE METABOLIC PANEL
ALT: 3 U/L (ref 0–35)
AST: 13 U/L (ref 0–37)
Albumin: 4 g/dL (ref 3.5–5.2)
Alkaline Phosphatase: 62 U/L (ref 39–117)
BUN: 24 mg/dL — ABNORMAL HIGH (ref 6–23)
CO2: 33 mEq/L — ABNORMAL HIGH (ref 19–32)
Calcium: 10.2 mg/dL (ref 8.4–10.5)
Chloride: 102 mEq/L (ref 96–112)
Creatinine, Ser: 0.64 mg/dL (ref 0.40–1.20)
GFR: 96.88 mL/min (ref >60.00)
Glucose, Bld: 137 mg/dL — ABNORMAL HIGH (ref 70–99)
Potassium: 4.1 mEq/L (ref 3.5–5.1)
Sodium: 140 mEq/L (ref 135–145)
Total Bilirubin: 0.4 mg/dL (ref 0.2–1.2)
Total Protein: 6.7 g/dL (ref 6.0–8.3)

## 2017-03-23 NOTE — Assessment & Plan Note (Signed)
New- resolved and exam benign/reassuring. I suspect this was due to holding the hose in her yard for several hours in the heat. Will check CMET today, including calcium, to rule out hypocalcemia and or other electrolyte abnormalities. She will let me know if her symptoms return. The patient indicates understanding of these issues and agrees with the plan.

## 2017-03-23 NOTE — Progress Notes (Signed)
Subjective:   Patient ID: [name], female    DOB: 01-17-1946, 71 y.o.   MRN: [id number]  [name] is a pleasant 71 y.o. year old female who presents to clinic today with Hand Pain (intermittenlty Bilateral numbness & cramping)  on 03/23/2017  HPI:  Called our nurse line yesterday with complaint of bilateral hand cramping- "so bad I cannot hold a book." She is concerned this is due to Prolia.  First dose of Prolia was given on 03/10/17.  Lab Results  Component Value Date   CALCIUM 10.1 03/04/2017   PHOS 3.6 06/13/2016   Symptoms have resolved today. Prior to her hands cramping, yesterday- she was holding a pressure washer hose in the yard for hours with both hands.  Current Outpatient Prescriptions on File Prior to Visit  Medication Sig Dispense Refill  . acetaminophen (TYLENOL) 325 MG tablet Take 650 mg by mouth every 6 (six) hours as needed.    Marland Kitchen albuterol (PROVENTIL HFA;VENTOLIN HFA) 108 (90 Base) MCG/ACT inhaler Inhale 1-2 puffs into the lungs every 6 (six) hours as needed for wheezing or shortness of breath. 1 Inhaler 0  . ALPRAZolam (XANAX) 0.5 MG tablet TAKE 1 TABLET BY MOUTH TWICE A DAY AS NEEDED 60 tablet 3  . aspirin 81 MG tablet Take 81 mg by mouth daily.     Marland Kitchen Bioflavonoid Products (GRAPE SEED PO) Take 1 tablet by mouth daily.     . Calcium Carbonate-Vitamin D (CALCIUM + D PO) Take 600 mg by mouth daily.    . carbidopa-levodopa (SINEMET IR) 25-100 MG tablet TAKE 3 TABLETS BY MOUTH 3 TIMES A DAY 810 tablet 0  . cetirizine (ZYRTEC) 10 MG tablet Take 10 mg by mouth daily.    . Cholecalciferol (VITAMIN D3) 1000 UNITS CAPS Take 1 capsule by mouth daily.      . feeding supplement (ENSURE COMPLETE) LIQD Take 237 mLs by mouth 2 (two) times daily between meals. 1 Bottle 11  . FLUoxetine (PROZAC) 20 MG capsule TAKE ONE CAPSULE BY MOUTH EVERY DAY 90 capsule 1  . gabapentin (NEURONTIN) 100 MG capsule Take 1 capsule (100 mg total) by mouth 3 (three) times daily. 270  capsule 0  . levofloxacin (LEVAQUIN) 500 MG tablet Take 1 tablet (500 mg total) by mouth daily. 7 tablet 0  . losartan-hydrochlorothiazide (HYZAAR) 50-12.5 MG tablet TAKE 1 TABLET BY MOUTH DAILY 90 tablet 3  . mirtazapine (REMERON) 30 MG tablet TAKE 1 TABLET (30 MG TOTAL) BY MOUTH AT BEDTIME. 90 tablet 0  . Multiple Vitamins-Minerals (CENTRUM SILVER ADULT 50+) TABS Take by mouth.    . ranitidine (ZANTAC) 150 MG tablet Take 150 mg by mouth daily as needed for heartburn.    . simvastatin (ZOCOR) 20 MG tablet TAKE 1 TABLET (20 MG TOTAL) BY MOUTH AT BEDTIME. 90 tablet 3  . vitamin B-12 (CYANOCOBALAMIN) 1000 MCG tablet Take 1,000 mcg by mouth daily.       No current facility-administered medications on file prior to visit.     Allergies  Allergen Reactions  . Codeine Sulfate     REACTION: hallucinations  . Penicillins Anaphylaxis, Swelling and Rash  . Wellbutrin [Bupropion] Other (See Comments)    Seizures and hallucinations    Past Medical History:  Diagnosis Date  . Adjustment disorder with depressed mood   . Anxiety   . Bell's palsy    1985  . DEMENTIA   . Depressive disorder, not elsewhere classified   . Disturbances of sensation  of smell and taste    "can't do either"  . DVT (deep venous thrombosis), left "early 2000's"   RLE  . Dysmetabolic syndrome X   . Essential hypertension, benign   . EYCXKGYJ(856.3) 05/2011   "pretty often since they put me on Parkinson's medicine"  . History of shingles    "as a teen and in my 3's"  . Mixed hyperlipidemia   . Morbid obesity ([place])   . Neurodegenerative gait disorder   . Parkinson disease [facility])    "they think that's what it is"  . Personal history of thrombophlebitis   . Syncope and collapse 12/06/11   "this am; I pass out fairly often"  . Type II or unspecified type diabetes mellitus without mention of complication, not stated as uncontrolled 12/06/11   "not anymore; had lap band OR"    Past Surgical History:  Procedure  Laterality Date  . bladder tack  11/1995  . BREAST BIOPSY  05/20/2000   bilaterally  . BREAST CYST ASPIRATION  12/1998   bilaterally  . CARDIAC CATHETERIZATION  2004  . CATARACT EXTRACTION W/ INTRAOCULAR LENS  IMPLANT, BILATERAL    . FRACTURE SURGERY  02/09/2006   bilateral elbows  . FRACTURE SURGERY     right knee  . FRACTURE SURGERY     tib plateau  . KNEE ARTHROSCOPY  12/12/2002   right   . LAPAROSCOPIC GASTRIC BANDING  2008  . SEPTOPLASTY  08/1998   with antral window   . TONSILLECTOMY AND ADENOIDECTOMY  1970  . TUBAL LIGATION  1970's    Family History  Problem Relation Age of Onset  . Asthma Mother   . COPD Father   . Heart disease Father   . Diabetes Sister   . Vision loss Sister        legally blind  . Breast cancer Neg Hx     Social History   Social History  . Marital status: Widowed    Spouse name: N/A  . Number of children: 2  . Years of education: N/A   Occupational History  . 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 Smoking status: Never Smoker  . Smokeless tobacco: Never Used  . Alcohol use No     Comment: none  . Drug use: No  . Sexual activity: Not on file   Other Topics Concern  . Not on file   Social History Narrative   Lives with daughter and granddaughter.   Daughter is HPOA.   DNR.   The PMH, PSH, Social History, Family History, Medications, and allergies have been reviewed in [facility], and have been updated if relevant.   Review of Systems  Respiratory: Negative.   Cardiovascular: Negative.   Musculoskeletal: Positive for myalgias.  Neurological: Negative.   All other systems reviewed and are negative.      Objective:    BP 140/76   Pulse 78   Ht 5' 2.5" (1.588 m)   Wt 152 lb (68.9 kg)   SpO2 96%   BMI 27.36 kg/m    Physical Exam  Constitutional: She is oriented to person, place, and time. She appears well-developed and well-nourished. No distress.  HENT:  Head: Normocephalic  and atraumatic.  Eyes: Conjunctivae are normal.  Cardiovascular: Normal rate.   Pulmonary/Chest: Effort normal.  Musculoskeletal: Normal range of motion.       Right hand: Normal.       Left hand: Normal.  Neurological: She is alert and oriented to person, place, and time. No cranial nerve deficit.  Skin: She is not diaphoretic.  Psychiatric: She has a normal mood and affect. Her behavior is normal. Judgment and thought content normal.  Nursing note and vitals reviewed.         Assessment & Plan:   Age-related osteoporosis without current pathological fracture  Cramping of hands No Follow-up on file.

## 2017-03-29 LAB — HM DIABETES EYE EXAM

## 2017-05-02 ENCOUNTER — Other Ambulatory Visit: Payer: Self-pay | Admitting: Family Medicine

## 2017-05-02 ENCOUNTER — Other Ambulatory Visit: Payer: Self-pay | Admitting: Neurology

## 2017-05-09 ENCOUNTER — Encounter: Payer: Self-pay | Admitting: Neurology

## 2017-05-09 ENCOUNTER — Other Ambulatory Visit: Payer: Self-pay | Admitting: Neurology

## 2017-05-09 ENCOUNTER — Ambulatory Visit (INDEPENDENT_AMBULATORY_CARE_PROVIDER_SITE_OTHER): Payer: Medicare Other | Admitting: Neurology

## 2017-05-09 VITALS — BP 136/80 | HR 78 | Ht 62.0 in | Wt 153.0 lb

## 2017-05-09 DIAGNOSIS — G214 Vascular parkinsonism: Secondary | ICD-10-CM

## 2017-05-09 DIAGNOSIS — H9193 Unspecified hearing loss, bilateral: Secondary | ICD-10-CM | POA: Diagnosis not present

## 2017-05-09 DIAGNOSIS — R296 Repeated falls: Secondary | ICD-10-CM | POA: Diagnosis not present

## 2017-05-09 DIAGNOSIS — G4752 REM sleep behavior disorder: Secondary | ICD-10-CM

## 2017-05-09 DIAGNOSIS — R413 Other amnesia: Secondary | ICD-10-CM | POA: Diagnosis not present

## 2017-05-09 MED ORDER — CLONAZEPAM 0.5 MG PO TABS: ORAL_TABLET | ORAL | 5 refills | Status: DC

## 2017-05-09 NOTE — Patient Instructions (Signed)
1. Stop Xanax. Start Clonazepam 0.5 mg - 1/2 tablet at bedtime, 1/2 tablet daily as needed.   2. Contact [facility] to set up appointment for hearing loss.   3. We will send referral for home health physical therapy. They will contact you to come out to your home.   4. We will set you up for neurocognitive testing.   You have been referred for a neurocognitive evaluation in our office.   The evaluation takes approximately two hours. The first part of the appointment is a clinical interview with the neuropsychologist (Dr. Macarthur Critchley). Please bring someone with you to this appointment if possible, as it is helpful for Dr. Si Raider to hear from both you and another adult who knows you well. After speaking with Dr. Si Raider, you will complete testing with her technician. The testing includes a variety of tasks- mostly question-and-answer, some paper-and-pencil. There is nothing you need to do to prepare for this appointment, but having a good night's sleep prior to the testing, and bringing eyeglasses and hearing aids (if you wear them), is advised.   About a week after the evaluation, you will return to follow up with Dr. Si Raider to review the test results. This appointment is about 30 minutes. If you would like a family member to receive this information as well, please bring them to the appointment.   We have to reserve several hours of the neuropsychologist's time and the psychometrician's time for your evaluation appointment. As such, please note that there is a No-Show fee of $100. If you are unable to attend any of your appointments, please contact our office as soon as possible to reschedule.

## 2017-05-09 NOTE — Progress Notes (Signed)
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  This patient is accompanied in the office by her child who supplements the history.      She is on carbidopa/levodopa 25/100, 3 tablets 3 times per day.  She is doing well on this medication, without side effects.  She had one fall the day before Thanksgiving.  She was in a parking lot of a restaurant and tripped over something(curb).  She fx her nose and had to have her lip stitched up.    Since then, however, she has done very well.  Her daughter reports that she has been very stable on her feet.  Last visit, she was complaining of a decreased appetite and increased anxiety.  Therefore, I increased her Remeron to 30 mg at night.  This helped with both sleep and appetite and she is very pleased.  She also has a hx of seizure, likely secondary to Wellbutrin.  Last visit, her keppra was d/c.  She has been seizure free since that time.  However, mood and depression continue to be an issue.  I sent her to Dr. Cheryln Manly but she didn't follow up.  She states that she cannot talk to him without her daughter present.  Her biggest issue is that her granddaughter is a Ship broker and she lives with the patient.  Apparently, the majority of the house is clean and safe with the exception of the granddaughters room.  The patient states that the mess just drives her crazy and she cannot stop thinking about it.  Her granddaughter goes to community college and her leaving the house is not an option.  04/03/13 update:  [name].  She was supposed to f/u with Dr Cheryln Manly due to significant anxiety and depression and she cancelled her 3/4, 3/13 and 3/27 visits and never r/s.  She has not seen him since 10/13.  She states that she is just "not going to talk with someone."  She, however, is doing some better in that regard.  Much of her stress surrounds her granddaughter, who lives with  her and is a Ship broker.  The patient states that her granddaughter has now admitted to this, but to seek help.  They were able to get some of the room cleaned up a while ago and that seemed to really help the patient's mental state.  However, the room has reconnected and is now just as it previously was.  She also reports a significant amount of stress over her sister who has end-stage Alzheimer's dementia.  Her sister no longer recognizes her, and the patient realizes when she goes around her sister, it makes her symptoms much worse.  She feels that her memory is getting worse, but nothing like her sisters.  She also thinks that her speech is getting worse.    She is having some headaches, similar to what she had in the past, prior to starting on the Levodopa.  It is over the L forehead, but not in the eye.  It is pounding.  It is daily.  It does not cause n/v or cause photophobia or phonophobia.  It makes her feel "drained."  It does not prevent her from doing things.  There are no lateralizing paresthesias with the headaches.  There are no vision changes.  07/30/13 update:  The pt is on carbidopa/levodopa 25/100 three tablets three times per day.  She is doing well in that  regard.  She did not go to PT because of transportation issues.  She was started on zanaflex last visit for HA and was also started on the exelon patch.  Interestingly, she states that the exelon patch caused headaches but it was started after the headache.  Nonetheless, she stopped it and still has headache.  It starts in the L forehead and radiates over the L side of the head.  She still thinks that the patch made them worse however and also thinks that it caused UTI's.  She is still on zanaflex but isn't sure its helping.  No falls/hallucination/syncope/lightheadedness.  11/27/13 update:  Patient is following up today in the movement disorder clinic.  She is currently on carbidopa/levodopa 25/100, 3 tablets by mouth 3 times per day.  Since our  last visit, I did get a note from Dr. Domingo Cocking at the headache clinic.  He started the patient on Indocin 25 mg 3 times a day and she was told to titrate to 75 mg 3 times a day.  She states that she had SE with that and she was changed to gabapentin 100 mg and that is helping.  He also scheduled her for trigger point injections and an MRI brain.  She did not care for the trigger point injections although they did help.  She has f/u in June.  Walking seems to have been a little slower, but no falls.  Her daughter is with her today and feels that walking has been more tenuous.   No hallucinations.  No diplopia but is having blurry vision.  Does have vision appt in [facility] in few weeks.  05/07/14 update:   Patient is following up today in the movement disorder clinic.  She is currently on carbidopa/levodopa 25/100, 3 tablets by mouth 3 times per day.  Daughter states that she often forgets middle of the day dose but pt denies that.  She takes it at 9-10 am on awakening, 3 pm and bedtime.  She and her daughter both think that it is helpful.   No diplopia.  No falls.  She saw Dr. Domingo Cocking in June and I got a note from him.  She had more trigger point injections.  She is doing really well from a headache standpoint.  She doesn't have to return to him for 6 months (saw him in September).  She continues on remeron for her depression and mood.  She states that she hates that she is confined to the home and depression is tied into that.  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  She then asks if her daughter can come back to the room for the discussion and she did.   She did do therapy with [facility] and she thought that it went well.   10/08/14 update:   Patient is following up today in the movement disorder clinic. She is accompanied by her daughter who supplements the hx.  She is currently on carbidopa/levodopa 25/100, 3 tablets  by mouth 3 times per day.  C/o memory loss.  States that her daughter will tell her to go to the kitchen to get things and she will get there and not be able to remember.  Her daughter thinks that she jumps subjects.  Also c/o feet paresthesias.  Worries that DM has "returned" as used to be diabetic but not anymore.  Last A1C in April was 6.0.  Also  states that right leg below the knee posteriorally she is having paresthesias.  Started after she bent over one day and the knee "popped."  No LBP.  On gabapentin but that is for headache, which is well controlled.  She has been headache free for a long time.  Received a note from Dr. Domingo Cocking recently and pt told to continue meds.    04/10/15 update:  The patient is following up today.  She is on carbidopa/levodopa 25/100, 3 tablets 3 times per day.  Because I was not convinced that the patient had a dementia but her previous neuropsych test years ago said that she did and because some of the patient's depression surrounds the fact that her daughter will not let her drive because of this potential diagnosis, we repeated neuropsych testing.  While there was evidence of cognitive change due to depression, she did not meet the criteria for the diagnosis of dementia.  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  It was recommended that she undergo psychiatric evaluation and treatment, but she refused.  She was again given a brochure for the driving evaluator in [place] to have a driving evaluation.  She reports that her depression was fairly well under control until the end of 2022/12/14 when her sister died and she has been appropriately grieving.  She did have an EMG done since last visit.  This was done on 11/07/2014 There was no evidence of a large fiber peripheral neuropathy, but there was evidence of a chronic, moderate right L2-L4 radiculopathy and a chronic, mild L5 radiculopathy on the right.  She fell one time getting out  of the chair as the right leg gave out.  She also rolled out of the bed at night one time.   She states that she was dreaming.   Fortunately, she did not get hurt with either of these.    10/20/15 update:  The patient follows up today, accompanied by her daughter who supplements the history.  She has a history of vascular parkinsonism and is currently on carbidopa/levodopa 25/100, 3 tablets 3 times per day.  Reports that she is a little "fidgety" and that she just can't stay still.  No new medications.  She also has a history of depression and is prescribed Remeron by her primary care physician.  Her depression mostly surrounds the fact that she cannot leave the house because her daughter will not let her drive.  Depression is improved as she has gotten some cats that keep her company.  We have talked previously about a driving evaluation, as I think she likely could drive from a neurologic standpoint.  Her daughter does not want her to pursue that.  No falls.   She has been headache free on gabapentin and doesn't want to change that  04/22/16 update:  The patient follows up today, accompanied by her daughter supplements the history.  She is on carbidopa/levodopa 25/100, 3 tablets 3 times per day.  She has had 2 falls.  With the first, she got up and made her bed, which she doesn't even remember but she does remember hitting the ground.  With the second one, her toilet was leaking and she fell over the wet towels on the floor and she hit her L head.  She went to the ER for this one, which was 8/29.  She has had a sore spot on the L occiput since. Was given tramadol but made her too sleepy. She had  a CT brain that day and I reviewed it.  It was nonacute and only WMD and mild atrophy.   She denies lightheadedness or near syncope.  She is on Remeron for her depression and feels that she has been stable in that regard.  Reports that "I talk to my cats while my family is working."   She has had no hallucinations.  She  is not particularly active with cardiovascular exercise.  Headaches well controlled on gabapentin 100 mg tid except sore occiput where she fell.  She also uses that for lumbar radiculopathy but reports that is better.    08/23/16 update:  The patient returns today, accompanied by her daughter who supplements the history.  She remains on carbidopa/levodopa 25/100, 3 tablets 3 times per day.  Overall, she feels that she has been a little shaky. Pt states that she fell one time; she was cleaning up a toilet that overflowed and tripped over towels (she told me about this one last time).  With another instance, she woke up and was totally dressed and making bed and had fallen.  She was awake when daughter went in but she doesn't remember if she was sleep walking or not.  Pt denies lightheadedness, near syncope.  No hallucinations.  Mood has been stable on remeron.  No SI/HI.  Has a chronic lumbar radiculopathy and hx of headache, well controlled on gabapentin 100 mg tid.  She has about 1 headache a week and lumbar radiculopathy is resolved.  She thinks that her short term memory hasn't been as good.  She will go into the kitchen and forget why she is there.  Long term memory is good.    05/09/17 update:  Patient seen today in follow-up.  I have not seen the patient for about 9 months.  Reports that she was started on prolia since our last visit.  She is accompanied by her daughter who supplements the history.  Patient is on carbidopa/levodopa 25/100, 3 tablets 3 times per day.  She fell out of the bed while sleeping.  Her daughter isn't convinced she was sleeping because her bed was "made up."  Patient insists she was sleeping because someone was chasing her.  She states that she is hallucinating because she is hearing "engines and music."  She is stumbling more but not falling during the day.  C/o continued short term memory loss.    Daughter who supplements the history.  Neuroimaging has previously been  performed.   It was done on 09/13/2013 and I reviewed these films.  There was atrophy and moderate small vessel disease.  MRI brain,09/13/13  FINDINGS:  Advanced cerebral and cerebellar atrophy. Chronic microvascular  ischemic change affecting the periventricular and subcortical white  matter as well as the brainstem above moderate to advanced degree.  No acute stroke, acute hemorrhage, mass lesion, or extra-axial  fluid. Hydrocephalus ex vacuo. No features to suggest normal  pressure hydrocephalus.  Post infusion, no abnormal enhancement of the brain or meninges. No  acute or reversible cause of dementia is evident.  Pituitary, pineal, and cerebellar tonsils unremarkable. No upper  cervical lesions. Flow voids are maintained throughout the carotid,  basilar, and vertebral arteries. There are no areas of chronic  hemorrhage. Visualized calvarium, skull base, and upper cervical  osseous structures unremarkable. Scalp and extracranial soft  tissues, orbits, sinuses, and mastoids show no acute process.  Similar appearance to priors.  IMPRESSION:  Atrophy and small vessel disease. No acute intracranial findings.  PREVIOUS MEDICATIONS: Sinemet  ALLERGIES:   Allergies  Allergen Reactions  . Codeine Sulfate     REACTION: hallucinations  . Penicillins Anaphylaxis, Swelling and Rash  . Wellbutrin [Bupropion] Other (See Comments)    Seizures and hallucinations    CURRENT MEDICATIONS:  Current Outpatient Prescriptions on File Prior to Visit  Medication Sig Dispense Refill  . acetaminophen (TYLENOL) 325 MG tablet Take 650 mg by mouth every 6 (six) hours as needed.    Marland Kitchen albuterol (PROVENTIL HFA;VENTOLIN HFA) 108 (90 Base) MCG/ACT inhaler Inhale 1-2 puffs into the lungs every 6 (six) hours as needed for wheezing or shortness of breath. 1 Inhaler 0  . ALPRAZolam (XANAX) 0.5 MG tablet TAKE 1 TABLET BY MOUTH TWICE A DAY AS NEEDED 60 tablet 3  . aspirin 81 MG tablet Take 81 mg by mouth  daily.     Marland Kitchen Bioflavonoid Products (GRAPE SEED PO) Take 1 tablet by mouth daily.     . Calcium Carbonate-Vitamin D (CALCIUM + D PO) Take 600 mg by mouth daily.    . carbidopa-levodopa (SINEMET IR) 25-100 MG tablet TAKE 3 TABLETS BY MOUTH 3 TIMES A DAY 810 tablet 0  . cetirizine (ZYRTEC) 10 MG tablet Take 10 mg by mouth daily.    . Cholecalciferol (VITAMIN D3) 1000 UNITS CAPS Take 1 capsule by mouth daily.      Marland Kitchen denosumab (PROLIA) 60 MG/ML SOLN injection Inject 60 mg into the skin every 6 (six) months. Administer in upper arm, thigh, or abdomen    . feeding supplement (ENSURE COMPLETE) LIQD Take 237 mLs by mouth 2 (two) times daily between meals. 1 Bottle 11  . FLUoxetine (PROZAC) 20 MG capsule TAKE ONE CAPSULE BY MOUTH EVERY DAY 90 capsule 1  . gabapentin (NEURONTIN) 100 MG capsule Take 1 capsule (100 mg total) by mouth 3 (three) times daily. 270 capsule 0  . levofloxacin (LEVAQUIN) 500 MG tablet Take 1 tablet (500 mg total) by mouth daily. 7 tablet 0  . losartan-hydrochlorothiazide (HYZAAR) 50-12.5 MG tablet TAKE 1 TABLET BY MOUTH DAILY 90 tablet 3  . mirtazapine (REMERON) 30 MG tablet TAKE 1 TABLET (30 MG TOTAL) BY MOUTH AT BEDTIME. 90 tablet 0  . Multiple Vitamins-Minerals (CENTRUM SILVER ADULT 50+) TABS Take by mouth.    . ranitidine (ZANTAC) 150 MG tablet Take 150 mg by mouth daily as needed for heartburn.    . simvastatin (ZOCOR) 20 MG tablet TAKE 1 TABLET (20 MG TOTAL) BY MOUTH AT BEDTIME. 90 tablet 3  . vitamin B-12 (CYANOCOBALAMIN) 1000 MCG tablet Take 1,000 mcg by mouth daily.       No current facility-administered medications on file prior to visit.     PAST MEDICAL HISTORY:   Past Medical History:  Diagnosis Date  . Adjustment disorder with depressed mood   . Anxiety   . Bell's palsy    1985  . DEMENTIA   . Depressive disorder, not elsewhere classified   . 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 DVT (deep venous thrombosis), left "early  2000's"   RLE  . Dysmetabolic syndrome X   . Essential hypertension, benign   . VFIEPPIR(518.8) 05/2011   "pretty often since they put me on Parkinson's medicine"  . History of shingles    "as a teen and in my 63's"  . Mixed hyperlipidemia   . Morbid obesity ([place])   . Neurodegenerative gait disorder   . Parkinson disease ([place])    "  they think that's what it is"  . Personal history of thrombophlebitis   . Syncope and collapse 12/06/11   "this am; I pass out fairly often"  . Type II or unspecified type diabetes mellitus without mention of complication, not stated as uncontrolled 12/06/11   "not anymore; had lap band OR"    PAST SURGICAL HISTORY:   Past Surgical History:  Procedure Laterality Date  . bladder tack  11/1995  . BREAST BIOPSY  05/20/2000   bilaterally  . BREAST CYST ASPIRATION  12/1998   bilaterally  . CARDIAC CATHETERIZATION  2004  . CATARACT EXTRACTION W/ INTRAOCULAR LENS  IMPLANT, BILATERAL    . FRACTURE SURGERY  02/09/2006   bilateral elbows  . FRACTURE SURGERY     right knee  . FRACTURE SURGERY     tib plateau  . KNEE ARTHROSCOPY  12/12/2002   right   . LAPAROSCOPIC GASTRIC BANDING  2008  . SEPTOPLASTY  08/1998   with antral window   . TONSILLECTOMY AND ADENOIDECTOMY  1970  . TUBAL LIGATION  1970's    SOCIAL HISTORY:   Social History   Social History  . Marital status: Widowed    Spouse name: N/A  . Number of children: 2  . Years of education: N/A   Occupational History  . 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 Smoking status: Never Smoker  . Smokeless tobacco: Never Used  . Alcohol use No     Comment: none  . Drug use: No  . Sexual activity: Not on file   Other Topics Concern  . Not on file   Social History Narrative   Lives with daughter and granddaughter.   Daughter is HPOA.   DNR.    FAMILY HISTORY:   Family Status  Relation Status  . Mother Deceased at age 21       natural causes   . Father Deceased at age 36       MI  . Sister Alive       DM-2  . Child Alive       2, HTN, DM-2, hyperlipidemia  . Neg Hx (Not Specified)    ROS:  A complete 10 system review of systems was obtained and was unremarkable apart from what is mentioned above.  PHYSICAL EXAMINATION:    VITALS:   Vitals:   05/09/17 0811  BP: 136/80  Pulse: 78  SpO2: 96%  Weight: 153 lb (69.4 kg)  Height: 5\' 2"  (1.575 m)     GEN:  The patient appears stated age and is in NAD. HEENT:  Normocephalic, atraumatic.  There is a slight sore bump on the L occiput.  The mucous membranes are moist. The superficial temporal arteries are without ropiness or tenderness. CV:  RRR Lungs:  CTAB Neck/HEME:  There are no carotid bruits bilaterally.  Neurological examination:  Orientation: The patient was A & O x 3.    MocA 04/11/2012 was 25/30, 04/03/13 was 20/30, and 05/07/14 and was 26/30 Cranial nerves: There is L facial droop with decreased forehead wrinkle on the L.  She has a positive stellwags sign.  There is facial hypomimia.  Pupils are equal round and reactive to light bilaterally. Fundoscopic exam reveals clear margins bilaterally. Extraocular muscles are intact.  There are square wave jerks and difficulty with smooth pursuit.  The speech is hypophonic but fluent and clear. She has no trouble repeating gutteral sounds.  Soft palate  rises symmetrically and there is no tongue deviation. Hearing is intact to conversational tone. Sensation: Sensation is intact to light touch throughout.   Motor: Strength is 5/5 in the bilateral upper and lower extremities.   Shoulder shrug is equal and symmetric.  There is no pronator drift.  Movement examination: Tone: There is normal tone in the bilateral upper extremities today. The tone in the lower extremities is normal.  Abnormal movements:  No tremor noted.   Coordination:  There is decremation with RAM's, only with heel taps on the L.   Gait and Station: The patient  pushes off of the chair.  She is somewhat unsteady when ambulating.    LABS:  Lab Results  Component Value Date   WBC 8.3 12/06/2016   HGB 13.3 02/06/2017   HCT 39.0 02/06/2017   MCV 94.3 12/06/2016   PLT 244.0 12/06/2016     Chemistry      Component Value Date/Time   NA 140 03/23/2017 1208   K 4.1 03/23/2017 1208   CL 102 03/23/2017 1208   CO2 33 (H) 03/23/2017 1208   BUN 24 (H) 03/23/2017 1208   CREATININE 0.64 03/23/2017 1208      Component Value Date/Time   CALCIUM 10.2 03/23/2017 1208   ALKPHOS 62 03/23/2017 1208   AST 13 03/23/2017 1208   ALT 3 03/23/2017 1208   BILITOT 0.4 03/23/2017 1208     Lab Results  Component Value Date   TSH 0.97 12/06/2016   Lab Results  Component Value Date   VITAMINB12 1,387 (H) 11/16/2012    Lab Results  Component Value Date   HGBA1C 6.3 12/06/2016   Lab Results  Component Value Date   ESRSEDRATE 18 04/03/2013    ASSESSMENT/PLAN: 1.  Vascular Parkinsonism.  Her primary issue is gait instability with slowness of movement.  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  However, both the patient and her daughter think that levodopa has been beneficial and they think it continues to be beneficial, as I actually considered tapering it.  Neither of them wanted that. symptoms got worse when I tried this in the past.    -she will remain on carbidopa/levodopa 25/100, 3 tablets 3 times per day.  Risks, benefits, side effects and alternative therapies were discussed.  The opportunity to ask questions was given and they were answered to the best of my ability.  The patient expressed understanding and willingness to follow the outlined treatment protocols.  -I had a long talk to her about PT.  Pt doesn't drive.  Will send PT to the home.   2.  Remote hx of seizure.  This is likely secondary to the initiation of wellbutrin.  She  should remain off of this medication.  EEG in the hospital was normal.  She has been seizure-free for several years. 3.  Depression.   -on remeron.  Managed by PCP.  - I think her depression is situational and related to not being able to drive.  They don't want to pursue driving eval.      -No SI/HI  -I encouraged her to exercise safely.   4.  Memory change  -Because I was not convinced that the patient had a dementia but her previous neuropsych test years ago said that she did and because some of the patient's depression surrounds the fact that her  daughter will not let her drive because of this potential diagnosis, we repeated neuropsych testing in 10/2014.  While there was evidence of cognitive change due to depression, she did not meet the criteria for the diagnosis of dementia. I told her again, that I was not convinced of dementia and neuropsych didn't support this right now.  Pt is not driving.  Dr. Conley Canal and I both gave her information on a medical driving evaluation but they have decided to not pursue that and not drive.  Pt asked me about rescheduling this and will schedule with Dr. Si Raider  -she is reading faithfully.  Talked about learning new activities and exercising.   5.  Chronic lumbar radiculopathy  -She is doing better in this regard 6.  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  I would recommend hearing eval.  Daughter would like to set that eval up herself with Dr. Constance Holster 7.  RBD  -would recommend klonopin and d/c the xanax.  Patient falling out of the bed with RBD.  Discussed safety issue.  Try 0.25 mg of klonopin bid to replace the xanax.  Small dose of xanax now so risk for w/d seizure is low.  Risks, benefits, side effects and alternative therapies were discussed.  The opportunity to ask questions was given and they were answered to the best of my ability.  The patient expressed understanding and  willingness to follow the outlined treatment protocols. 8.  Headache  -tried to drop gabapentin to 100 bid but headaches returned so on tid now. 9.  Follow up is anticipated in the next few months, sooner should new neurologic issues arise.  Much greater than 50% of this visit was spent in counseling and coordinating care.  Total face to face time:  30 min

## 2017-05-12 ENCOUNTER — Other Ambulatory Visit: Payer: Self-pay | Admitting: Neurology

## 2017-05-18 ENCOUNTER — Telehealth: Payer: Self-pay | Admitting: Neurology

## 2017-05-18 NOTE — Telephone Encounter (Signed)
Spoke with Mariann Laster at [facility] and patient refused services. Dr. Carles Collet Juluis Rainier.

## 2017-06-13 ENCOUNTER — Other Ambulatory Visit: Payer: Self-pay | Admitting: Neurology

## 2017-06-23 ENCOUNTER — Ambulatory Visit: Payer: Self-pay | Admitting: Primary Care

## 2017-07-11 ENCOUNTER — Other Ambulatory Visit: Payer: Self-pay | Admitting: Family Medicine

## 2017-08-11 ENCOUNTER — Ambulatory Visit (INDEPENDENT_AMBULATORY_CARE_PROVIDER_SITE_OTHER): Payer: Medicare Other | Admitting: Psychology

## 2017-08-11 ENCOUNTER — Encounter: Payer: Self-pay | Admitting: Psychology

## 2017-08-11 DIAGNOSIS — R413 Other amnesia: Secondary | ICD-10-CM

## 2017-08-11 DIAGNOSIS — G214 Vascular parkinsonism: Secondary | ICD-10-CM | POA: Diagnosis not present

## 2017-08-11 DIAGNOSIS — F330 Major depressive disorder, recurrent, mild: Secondary | ICD-10-CM

## 2017-08-11 NOTE — Progress Notes (Signed)
NEUROBEHAVIORAL STATUS EXAM   Name: [name] Date of Birth: Jul 06, 1946 Date of Interview: 08/11/2017  Reason for Referral:  [name] is a 71 y.o. female who is referred for neuropsychological evaluation by Dr. 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 This patient is unaccompanied in the office for today's visit.  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 [name] and her daughter have expressed short term memory concerns over the years, and the patient has undergone two prior neuropsychological evaluations. The first was in October 2012 at [facility]. At that time, the patient denied significant cognitive concerns but her daughter noted gradual onset of cognitive decline 2-4 years prior. Her daughter noted at that time that the patient frequently repeated herself, and the patient's coworkers had commented on memory problems as well. On the 2012 evaluation, [name] demonstrated impairments on tests of processing speed, visual-spatial skills, phonemic verbal fluency, executive functioning and memory. Depression/anxiety were not thought to play a role in cognitive dysfunction. 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 The patient was re-evaluated at [facility] in March 2016. Her daughter continued to report worsening of cognitive functioning/memory. On this evaluation, [name] demonstrated impairments on tests of semantic verbal fluency, processing speed, executive functioning, visual-spatial skills, and visual memory. She also endorsed significant depression and anxiety despite being on three psychotropic medications at the time. Meanwhile,  verbal memory scores improved to the normal range. Given this area of improvement, the examiner felt that the patient likely did not have a neurodegenerative dementia. Additionally, it was felt that a significant portion of the patient's cognitive symptoms could be related to mood/depression. 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  Brain MRI completed in 08/2013 reportedly revealed advanced cerebral and cerebellar atrophy, as well as chronic microvascular ischemic change affecting the periventricular and subcortical white matter as well as the brainstem above moderate to advanced degree. Head CT after a fall in 02/2016 revealed no acute intracranial findings, and stable age related cerebral atrophy, ventriculomegaly and periventricular white matter disease.  The patient has a remote history of one seizure and visual hallucinations in the context of Wellbutrin. When she was taken off the medication, she did not have any more seizures or visual hallucinations. She had an EEG that was normal. She has been seizure free for several years.  Family history is reportedly significant for rapid onset dementia in the patient's sister who passed away at age 82.  [name] was most recently seen by Dr. Carles Collet on 05/09/2017. She and her daughter complained of continued short term memory loss. She was not having falls during the day but had fallen out of bed while having a vivid dream. 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 Dr. Carles Collet recommend Klonopin and d/c'ing Xanax.  At today's visit (08/11/2017), the patient reports worsening cognitive function since her last neurocognitive evaluation in [place]. She feels short term memory is a primary issue. She forgets what she was going to do, or why she went into a room. She forgets details of conversations and she has more trouble recalling recent  events. Her daughter tells her she repeats herself "constantly". She is  having trouble keeping up with the plot in TV shows, so she is not watching much TV anymore. However, she still enjoys reading and doesn't have trouble keeping up with storylines in books. She is more apt to get distracted while doing a task. She processes information more slowly. She complains of word finding difficulty and more generally difficulty expressing herself. She sometimes has trouble comprehending other people.   She stopped driving in or around 11/09/09 due to the seizure she had. She reported that she would love to drive, but her daughter will not let her. According to records reviewed, both the patient's daughter and other providers have recommended a driving evaluation several times to see if she can return to driving, but she has declined each time. She denied any history of prior driving difficulty or navigational difficulty prior to stopping driving. She currently lives in her own home; her daughter and 58yo granddaughter live with her. She manages her medications independently and states that sometimes she forgets whether or not she has taken them. Her daughter manages the finances/bill and has since 2011-11-10. The patient does very little cooking. She is able to keep track of her appointments.   The patient has not had any falls since she last saw Dr. Carles Collet in October.   She reports that she frequently hears sounds like a motor/engine running. Dr. Carles Collet has felt this is likely her tinnitus and not auditory hallucinations. She has never heard voices or other noises. She has never had visual hallucinations except when she was on Wellbutrin, as discussed previously.   She endorses depressed mood, stating she thinks the bad weather is a major factor contributing to her low mood. Psychiatry/behavioral health intervention has been recommended multiple times in the past but the patient never followed through. She is prescribed Remeron  and Prozac. She denies any current psychosocial stressors. She reports that she feels tired most of the time even though she sleeps well. She dozes off during the day. She reports appetite is good. She denies suicidal ideation or intention.   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  Marital history: Widowed. She has been married twice. She was married to her second husband for 38 years. He passed away in 11-10-06. Children: One daughter, one son, two grandchildren. Alcohol: None Tobacco: None, never a smoker   Medical History: Past Medical History:  Diagnosis Date  . Adjustment disorder with depressed mood   . Anxiety   . Bell's palsy    1985  . Dementia   . Depressive disorder, not elsewhere classified   . 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 DVT (deep venous thrombosis), left "early 2000's"   RLE  . Dysmetabolic syndrome X   . Essential hypertension, benign   . DGUYQIHK(742.5) 05/2011   "pretty often since they put me on Parkinson's medicine"  . History of shingles    "as a teen and in my 49's"  . Mixed hyperlipidemia   . Morbid obesity ([place])   . Neurodegenerative gait disorder   . Parkinson disease [facility])    "they think that's what it is"  . Personal history of thrombophlebitis   . Syncope and collapse 12/06/11   "this am; I pass out fairly often"  . Type II or unspecified type diabetes mellitus without mention of complication, not stated as uncontrolled 12/06/11   "  not anymore; had lap band OR"     Current Medications:  Outpatient Encounter Medications as of 08/11/2017  Medication Sig  . acetaminophen (TYLENOL) 325 MG tablet Take 650 mg by mouth every 6 (six) hours as needed.  Marland Kitchen albuterol (PROVENTIL HFA;VENTOLIN HFA) 108 (90 Base) MCG/ACT inhaler Inhale 1-2 puffs into the lungs every 6 (six) hours as needed  for wheezing or shortness of breath.  Marland Kitchen aspirin 81 MG tablet Take 81 mg by mouth daily.   Marland Kitchen Bioflavonoid Products (GRAPE SEED PO) Take 1 tablet by mouth daily.   . Calcium Carbonate-Vitamin D (CALCIUM + D PO) Take 600 mg by mouth daily.  . carbidopa-levodopa (SINEMET IR) 25-100 MG tablet TAKE 3 TABLETS BY MOUTH 3 TIMES A DAY  . cetirizine (ZYRTEC) 10 MG tablet Take 10 mg by mouth daily.  . Cholecalciferol (VITAMIN D3) 1000 UNITS CAPS Take 1 capsule by mouth daily.    . clonazePAM (KLONOPIN) 0.5 MG tablet 1/2 tablet at bedtime, 1/2 tablet daily PRN  . denosumab (PROLIA) 60 MG/ML SOLN injection Inject 60 mg into the skin every 6 (six) months. Administer in upper arm, thigh, or abdomen  . feeding supplement (ENSURE COMPLETE) LIQD Take 237 mLs by mouth 2 (two) times daily between meals.  Marland Kitchen FLUoxetine (PROZAC) 20 MG capsule TAKE 1 CAPSULE BY MOUTH EVERY DAY  . gabapentin (NEURONTIN) 100 MG capsule TAKE 1 CAPSULE BY MOUTH THREE TIMES A DAY  . levofloxacin (LEVAQUIN) 500 MG tablet Take 1 tablet (500 mg total) by mouth daily.  Marland Kitchen losartan-hydrochlorothiazide (HYZAAR) 50-12.5 MG tablet TAKE 1 TABLET BY MOUTH DAILY  . mirtazapine (REMERON) 30 MG tablet TAKE 1 TABLET (30 MG TOTAL) BY MOUTH AT BEDTIME.  . Multiple Vitamins-Minerals (CENTRUM SILVER ADULT 50+) TABS Take by mouth.  . ranitidine (ZANTAC) 150 MG tablet Take 150 mg by mouth daily as needed for heartburn.  . simvastatin (ZOCOR) 20 MG tablet TAKE 1 TABLET (20 MG TOTAL) BY MOUTH AT BEDTIME.  . vitamin B-12 (CYANOCOBALAMIN) 1000 MCG tablet Take 1,000 mcg by mouth daily.     No facility-administered encounter medications on file as of 08/11/2017.      Behavioral Observations:   Appearance: Casually and appropriately dressed and groomed. Left sided facial droop is present (reportedly due to remote history of Bell's palsy). Gait: Ambulated independently with short steps, mildly unsteady Speech: Fluent; normal rate, rhythm and volume. Minimal word  finding difficulty. Mildly dysarthric (states she is not wearing her dentures today) Thought process: Linear, goal directed Affect: Mildly blunted but generally positive Interpersonal: Pleasant, appropriate   30 minutes spent face-to-face with patient completing neurobehavioral status exam. 45 minutes spent integrating medical records/clinical data and completing this report. CPT [id number].   TESTING: There is medical necessity to proceed with neuropsychological assessment as the results will be used to aid in differential diagnosis and clinical decision-making and to inform specific treatment recommendations. Per the patient and medical records reviewed, there has been a change in cognitive functioning and a reasonable suspicion of dementia (e.g., vascular dementia).   Clinical Decision Making: In considering the patient's current level of functioning, level of presumed impairment, nature of symptoms, emotional and behavioral responses during the interview, level of literacy, and observed level of motivation, a battery of tests was selected and communicated to the psychometrician.   Following the clinical interview/neurobehavioral status exam, the patient completed this full battery of neuropsychological testing with my psychometrician under my supervision (see separate note).   PLAN: The patient will return to  see me for a follow-up session at which time her test performances and my impressions and treatment recommendations will be reviewed in detail.  Evaluation ongoing; full report to follow.

## 2017-08-11 NOTE — Progress Notes (Signed)
   Neuropsychology Note  [name] completed 60 minutes of neuropsychological testing with technician, Milana Kidney, BS, under the supervision of Dr. Macarthur Critchley, Licensed Psychologist. The patient did not appear overtly distressed by the testing session, per behavioral observation or via self-report to the technician. Rest breaks were offered.   Clinical Decision Making: In considering the patient's current level of functioning, level of presumed impairment, nature of symptoms, emotional and behavioral responses during the interview, level of literacy, and observed level of motivation/effort, a battery of tests was selected and communicated to the psychometrician.  Communication between the psychologist and technician was ongoing throughout the testing session and changes were made as deemed necessary based on patient performance on testing, technician observations and additional pertinent factors such as those listed above.  [name] will return within approximately 2 weeks for an interactive feedback session with Dr. Si Raider at which time her test performances, clinical impressions and treatment recommendations will be reviewed in detail. The patient understands she can contact our office should she require our assistance before this time.  35 minutes spent performing neuropsychological evaluation services/clinical decision making (psychologist). [CPT 79728] 60 minutes spent face-to-face with patient administering standardized tests,30 minutes spent scoring (technician). [CPT Y8200648, [id number] units]  Full report to follow.

## 2017-08-14 ENCOUNTER — Encounter: Payer: Self-pay | Admitting: Psychology

## 2017-08-16 ENCOUNTER — Telehealth: Payer: Self-pay | Admitting: *Deleted

## 2017-08-16 NOTE — Telephone Encounter (Signed)
Pt transferred to [place] with Dr Deborra Medina and is due for her prolia inj 08/2017. Please process benefits and contact pt regarding injection

## 2017-08-17 NOTE — Telephone Encounter (Signed)
RB-Plz see message below about Prolia/plz advise/thx dmf

## 2017-08-30 NOTE — Telephone Encounter (Signed)
Called & informed patient's daughter, Docia Furl, that insurance benefits are pending at this time; once this information has been provided, RN will call to setup nurse visit appointment for prolia injection. She verbalized understanding & did not have any further questions or concerns.

## 2017-08-30 NOTE — Telephone Encounter (Signed)
Pt is calling and would like to know when she can schedule her prolia injections.

## 2017-09-06 ENCOUNTER — Telehealth: Payer: Self-pay | Admitting: Behavioral Health

## 2017-09-06 DIAGNOSIS — M810 Age-related osteoporosis without current pathological fracture: Secondary | ICD-10-CM

## 2017-09-06 NOTE — Progress Notes (Signed)
NEUROPSYCHOLOGICAL EVALUATION   Name:    Martha GODSIL  Date of Birth:   08-01-1945 Date of Interview:  08/11/2017 Date of Testing:  08/11/2017   Date of Feedback:  09/08/2017       Background Information:  Reason for Referral:  [name] is a 72 y.o. female referred by Dr. Wells Guiles Tat to assess her current level of cognitive functioning and assist in differential diagnosis. The current evaluation consisted of a review of available medical records, an interview with the patient, and the completion of a neuropsychological testing battery. Informed consent was obtained.  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 Ms. Haberl and her daughter have expressed short term memory concerns over the years, and the patient has undergone two prior neuropsychological evaluations. The first was in October 2012 at [place]. At that time, the patient denied significant cognitive concerns but her daughter noted gradual onset of cognitive decline 2-4 years prior. Her daughter noted at that time that the patient frequently repeated herself, and the patient's coworkers had commented on memory problems as well. On the 2012 evaluation, [name] demonstrated impairments on tests of processing speed, visual-spatial skills, phonemic verbal fluency, executive functioning and memory. Depression/anxiety were not thought to play a role in cognitive dysfunction. 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 The patient was re-evaluated at [facility] in March 2016. Her daughter continued to report worsening of cognitive functioning/memory. On this evaluation, [name] demonstrated impairments on tests of semantic verbal fluency, processing speed, executive  functioning, visual-spatial skills, and visual memory. She also endorsed significant depression and anxiety despite being on three psychotropic medications at the time. Meanwhile, verbal memory scores improved to the normal range. Given this area of improvement, the examiner felt that the patient likely did not have a neurodegenerative dementia. Additionally, it was felt that a significant portion of the patient's cognitive symptoms could be related to mood/depression. 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  Brain MRI completed in 08/2013 reportedly revealed advanced cerebral and cerebellar atrophy, as well as chronic microvascular ischemic change affecting the periventricular and subcortical white matter as well as the brainstem above moderate to advanced degree. Head CT after a fall in 02/2016 revealed no acute intracranial findings, and stable age related cerebral atrophy, ventriculomegaly and periventricular white matter disease.  The patient has a remote history of one seizure and visual hallucinations in the context of Wellbutrin. When she was taken off the medication, she did not have any more seizures or visual hallucinations. She had an EEG that was normal. She has been seizure free for several years.  Family history is reportedly significant for rapid onset dementia in the patient's sister who passed away at age 49.  [name] was most recently seen by Dr. Carles Collet on 05/09/2017. She and her daughter complained of continued short term memory loss. She was not having falls during the day but had fallen out of bed while having a vivid dream. 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 Dr. Carles Collet recommend Klonopin and d/c'ing Xanax.  At today's visit (08/11/2017), the patient reports worsening cognitive function since her last neurocognitive evaluation in [place]. She feels  short term memory is a primary issue. She  forgets what she was going to do, or why she went into a room. She forgets details of conversations and she has more trouble recalling recent events. Her daughter tells her she repeats herself "constantly". She is having trouble keeping up with the plot in TV shows, so she is not watching much TV anymore. However, she still enjoys reading and doesn't have trouble keeping up with storylines in books. She is more apt to get distracted while doing a task. She processes information more slowly. She complains of word finding difficulty and more generally difficulty expressing herself. She sometimes has trouble comprehending other people.   She stopped driving in or around 11/12/09 due to the seizure she had. She reported that she would love to drive, but her daughter will not let her. According to records reviewed, both the patient's daughter and other providers have recommended a driving evaluation several times to see if she can return to driving, but she has declined each time. She denied any history of prior driving difficulty or navigational difficulty prior to stopping driving. She currently lives in her own home; her daughter and 70yo granddaughter live with her. She manages her medications independently and states that sometimes she forgets whether or not she has taken them. Her daughter manages the finances/bill and has since 11-13-11. The patient does very little cooking. She is able to keep track of her appointments.   The patient has not had any falls since she last saw Dr. Carles Collet in October.   She reports that she frequently hears sounds like a motor/engine running. Dr. Carles Collet has felt this is likely her tinnitus and not auditory hallucinations. She has never heard voices or other noises. She has never had visual hallucinations except when she was on Wellbutrin, as discussed previously.   She endorses depressed mood, stating she thinks the bad weather is a major factor  contributing to her low mood. Psychiatry/behavioral health intervention has been recommended multiple times in the past but the patient never followed through. She is prescribed Remeron and Prozac. She denies any current psychosocial stressors. She reports that she feels tired most of the time even though she sleeps well. She dozes off during the day. She reports appetite is good. She denies suicidal ideation or intention.   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  Marital history: Widowed. She has been married twice. She was married to her second husband for 38 years. He passed away in Nov 13, 2006. Children: One daughter, one son, two grandchildren. Alcohol: None Tobacco: None, never a smoker   Medical History:  Past Medical History:  Diagnosis Date  . Adjustment disorder with depressed mood   . Anxiety   . Bell's palsy    1985  . Dementia   . Depressive disorder, not elsewhere classified   . 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 DVT (deep venous thrombosis), left "early 2000's"   RLE  . Dysmetabolic syndrome X   . Essential hypertension, benign   . VFIEPPIR(518.8) 05/2011   "pretty often since they put me on Parkinson's medicine"  . History of shingles    "as a teen and in my 39's"  . Mixed hyperlipidemia   . Morbid obesity ([place])   . Neurodegenerative gait disorder   . Parkinson disease [facility])    "they think that's what it is"  .  Personal history of thrombophlebitis   . Syncope and collapse 12/06/11   "this am; I pass out fairly often"  . Type II or unspecified type diabetes mellitus without mention of complication, not stated as uncontrolled 12/06/11   "not anymore; had lap band OR"    Current medications:  Outpatient Encounter Medications as of 09/08/2017  Medication Sig  . acetaminophen (TYLENOL) 325 MG tablet  Take 650 mg by mouth every 6 (six) hours as needed.  Marland Kitchen albuterol (PROVENTIL HFA;VENTOLIN HFA) 108 (90 Base) MCG/ACT inhaler Inhale 1-2 puffs into the lungs every 6 (six) hours as needed for wheezing or shortness of breath.  Marland Kitchen aspirin 81 MG tablet Take 81 mg by mouth daily.   Marland Kitchen Bioflavonoid Products (GRAPE SEED PO) Take 1 tablet by mouth daily.   . Calcium Carbonate-Vitamin D (CALCIUM + D PO) Take 600 mg by mouth daily.  . carbidopa-levodopa (SINEMET IR) 25-100 MG tablet TAKE 3 TABLETS BY MOUTH 3 TIMES A DAY  . cetirizine (ZYRTEC) 10 MG tablet Take 10 mg by mouth daily.  . Cholecalciferol (VITAMIN D3) 1000 UNITS CAPS Take 1 capsule by mouth daily.    . clonazePAM (KLONOPIN) 0.5 MG tablet 1/2 tablet at bedtime, 1/2 tablet daily PRN  . denosumab (PROLIA) 60 MG/ML SOLN injection Inject 60 mg into the skin every 6 (six) months. Administer in upper arm, thigh, or abdomen  . feeding supplement (ENSURE COMPLETE) LIQD Take 237 mLs by mouth 2 (two) times daily between meals.  Marland Kitchen FLUoxetine (PROZAC) 20 MG capsule TAKE 1 CAPSULE BY MOUTH EVERY DAY  . gabapentin (NEURONTIN) 100 MG capsule TAKE 1 CAPSULE BY MOUTH THREE TIMES A DAY  . levofloxacin (LEVAQUIN) 500 MG tablet Take 1 tablet (500 mg total) by mouth daily.  Marland Kitchen losartan-hydrochlorothiazide (HYZAAR) 50-12.5 MG tablet TAKE 1 TABLET BY MOUTH DAILY  . mirtazapine (REMERON) 30 MG tablet TAKE 1 TABLET (30 MG TOTAL) BY MOUTH AT BEDTIME.  . Multiple Vitamins-Minerals (CENTRUM SILVER ADULT 50+) TABS Take by mouth.  . ranitidine (ZANTAC) 150 MG tablet Take 150 mg by mouth daily as needed for heartburn.  . simvastatin (ZOCOR) 20 MG tablet TAKE 1 TABLET (20 MG TOTAL) BY MOUTH AT BEDTIME.  . vitamin B-12 (CYANOCOBALAMIN) 1000 MCG tablet Take 1,000 mcg by mouth daily.     No facility-administered encounter medications on file as of 09/08/2017.      Current Examination:  Behavioral Observations:  Appearance: Casually and appropriately dressed and groomed. Left  sided facial droop is present (reportedly due to remote history of Bell's palsy). Gait: Ambulated independently with short steps, mildly unsteady Speech: Fluent; normal rate, rhythm and volume. Minimal word finding difficulty. Mildly dysarthric (states she is not wearing her dentures today) Thought process: Linear, goal directed Affect: Mildly blunted but generally positive Interpersonal: Pleasant, appropriate Orientation: Oriented to all spheres. She was unable to name the current President but accurately named his predecessor.    Tests Administered: . Test of Premorbid Functioning (TOPF) . Wechsler Adult Intelligence Scale-Fourth Edition (WAIS-IV): Similarities, Music therapist, Coding and Digit Span subtests . Wechsler Memory Scale-Fourth Edition (WMS-IV) Older Adult Version (ages 12-90): Logical Memory I, II and Recognition subtests  . [employer] Verbal Learning Test - 2nd Edition (CVLT-2) Short Form . Repeatable Battery for the Assessment of Neuropsychological Status (RBANS) Form A:  Figure Copy and Recall subtests and Semantic Fluency subtest . Boston Naming Test (BNT) . Boston Diagnostic Aphasia Examination: Complex Ideational Material subtest . Controlled Oral Word Association Test (COWAT) . Trail Making  Test A and B . Clock drawing test . Geriatric Depression Scale (GDS) 15 Item . Generalized Anxiety Disorder - 7 item screener (GAD-7) . Parkinson's Disease Questionnaire (PDQ-39)  Test Results: Note: Standardized scores are presented only for use by appropriately trained professionals and to allow for any future test-retest comparison. These scores should not be interpreted without consideration of all the information that is contained in the rest of the report. The most recent standardization samples from the test publisher or other sources were used whenever possible to derive standard scores; scores were corrected for age, gender, ethnicity and education when available.   Test  Scores:  Test Name Raw Score Standardized Score Descriptor  TOPF 27/70 SS= 87 Low average  WAIS-IV Subtests     Similarities 14/36 ss= 6 Low average  Block Design 20/66 ss= 7 Low average  Coding 38/135 ss= 8 Average  Digit Span Forward 11/16 ss= 12 High average  Digit Span Backward 8/16 ss= 10 Average  WMS-IV Subtests     LM I 27/53 ss= 8 Average  LM II 15/39 ss= 9 Average  LM II Recognition 18/23 Cum %: 26-50 WNL  RBANS Subtests     Figure Copy 20/20 Z= 1.2 High average  Figure Recall 11/20 Z= -0.4 Average  Semantic Fluency 15 Z= -0.9 Low average  CVLT-II Scores     Trial 1 6/9 Z= 0 Average  Trials 1-4 total 23/36 T= 43 Average  SD Free Recall 6/9 Z= -0.5 Average  LD Free Recall 7/9 Z= 0.5 Average  LD Cued Recall 6/9 Z= -0.5 Average  Recognition Discriminability 8/9 hits,0 false positives Z= 0 Average  Forced Choice Recognition 9/9  WNL  BNT 47/60 T= 42 Low average  BDAE Complex Ideational Material 12/12  WNL  COWAT-FAS 31 T= 41 Low average  COWAT-Animals 18 T= 50 Average  Trail Making Test A  66" 0 errors T= 34 Borderline  Trail Making Test B  108" 1 error T= 50 Average  Clock Drawing   WNL  GDS-15 12/15  Severe  GAD-7 17/21  Severe  PDQ-39     Mobility 62.5%    Activities of Daily Living 8.33%    Emotional Well Being 54.16%    Stigma  43.75%    Social Support 33.33%    Cognitive Impairment 37.5%    Communication 25%    Bodily Discomfort 50%        Description of Test Results:  Premorbid verbal intellectual abilities were estimated to have been within the low average range based on a test of word reading. Psychomotor processing speed was variable (average on a coding task and borderline impaired on Trails A). Auditory attention and working memory were high average to average, respectively. Visual-spatial construction was within normal limits (high average on drawn copy of complex geometric figure; low average on construction of three dimensional blocks).  Language abilities were intact. Specifically, confrontation naming was low average, and semantic verbal fluency was average. Auditory comprehension of complex ideational material was intact. With regard to verbal memory, encoding and acquisition of non-contextual information (i.e., word list) was average. After a brief distracter task, free recall was average. After a delay, free recall was average. Cued recall was average. Performance on a yes/no recognition task was average. On another verbal memory test, encoding and acquisition of contextual auditory information (i.e., short stories) was average. After a delay, free recall was average. Performance on a yes/no recognition task was within normal limits. With regard to non-verbal memory, delayed  free recall of visual information was average. Executive functioning was within normal limits overall. Mental flexibility and set-shifting were average on Trails B. Verbal fluency with phonemic search restrictions was low average. Verbal abstract reasoning was low average. Performance on a clock drawing task was within normal limits. On a self-report measure of mood, the patient's responses were indicative of clinically significant depression at the present time. Symptoms endorsed included: dropping interests/activities, feeling that life is empty, boredom, not feeling in good spirits, fear of something bad happening, feeling helpless, prefefring to stay home, feeling worthless, and thinking most people are better off than she is. On a self-report measure of anxiety, the patient endorsed clinically significant generalized anxiety characterized by excessive worrying, inability to control worrying, nervousness, difficulty relaxing, restlessness, irritability and fear of something awful happening. On a self-report measure assessing the impact of parkinsonism on quality of life and daily functioning, the patient reported the most difficulty with mobility, emotional wellbeing,  bodily discomfort, and stigma. She also reported some cognitive impairment, reduced social support and communication problems. She did not report significant difficulty performing ADLs.   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 No evidence of neurodegenerative dementia. Significant depression and generalized anxiety. Results of cognitive testing are largely within normal limits for age. Compared to her last evaluation completed in 09/2014, all performances are either stable or improved. Areas of improvement include visual-spatial construction, auditory attention, semantic verbal fluency, and executive functioning. Processing speed is variable and considered an area of relative weakness. Learning and memory for new information (both visual and auditory) is considered a strength.  Her test results and her level of functioning do NOT suggest dementia. The fact that her test performances have not declined across the course of 3 evaluations in over 6 years is quite reassuring and certainly does not suggest any neurodegenerative dementia. 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 She continues to present with significant depression and anxiety, which are likely significant contributing factors to perceived cognitive decline. Mental health treatment has been recommended several times in the past and continues to be a primary recommendation.    Recommendations/Plan: Based on the findings of the present evaluation, the following recommendations are offered:  1. Mental health treatment is highly encouraged as the patient is reporting severe depression and anxiety, and give that the patient's mild cognitive impairment is likely exacerbated by mental health factors. Psychiatry evaluation along with individual counseling is recommended. She again declined mental health counseling but she states she is open to a change in her medications, as  she has been on Prozac for a long time. 2. She was reassured that her test results are actually quite strong and do not suggest cognitive decline over several years time.  3. She should engage in activities that provide mental stimulation, social interaction and safe cardiovascular exercise in order to enhance brain health and quality of life. 4. Neuropsychological re-evaluation will likely not be necessary unless a significant change in cognitive function is observed or reported.   Feedback to Patient: RAMIE PALLADINO and her daughter returned for a feedback appointment on 09/08/2017 to review the results of her neuropsychological evaluation with this provider. 15 minutes face-to-face time was spent reviewing her test results, my impressions and my recommendations as detailed above.    Total time spent on this patient's case: 75 minutes for neurobehavioral status exam with psychologist (CPT code [phone]); 90 minutes of testing/scoring by psychometrician under psychologist's supervision (CPT codes [id number], [phone]  units); 180 minutes for integration of patient data, interpretation of standardized test results and clinical data, clinical decision making, treatment planning and preparation of this report, and interactive feedback with review of results to the patient/family by psychologist (CPT codes [id number], [phone] units).      Thank you for your referral of [facility]. Please feel free to contact me if you have any questions or concerns regarding this report.

## 2017-09-06 NOTE — Telephone Encounter (Signed)
Verbal order given by Dr. Deborra Medina to complete lab for calcium level. Appointment scheduled for 09/14/17 at 7:30 AM.

## 2017-09-06 NOTE — Telephone Encounter (Signed)
Received summary of benefits for Prolia. The estimated out of pocket expense is $0. Patient was made aware of this info & voiced understanding. Appointment has been scheduled on 09/21/17 at 8:40 AM for prolia injection.

## 2017-09-07 NOTE — Progress Notes (Signed)
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  This patient is accompanied in the office by her child who supplements the history.      She is on carbidopa/levodopa 25/100, 3 tablets 3 times per day.  She is doing well on this medication, without side effects.  She had one fall the day before Thanksgiving.  She was in a parking lot of a restaurant and tripped over something(curb).  She fx her nose and had to have her lip stitched up.    Since then, however, she has done very well.  Her daughter reports that she has been very stable on her feet.  Last visit, she was complaining of a decreased appetite and increased anxiety.  Therefore, I increased her Remeron to 30 mg at night.  This helped with both sleep and appetite and she is very pleased.  She also has a hx of seizure, likely secondary to Wellbutrin.  Last visit, her keppra was d/c.  She has been seizure free since that time.  However, mood and depression continue to be an issue.  I sent her to Dr. Cheryln Manly but she didn't follow up.  She states that she cannot talk to him without her daughter present.  Her biggest issue is that her granddaughter is a Ship broker and she lives with the patient.  Apparently, the majority of the house is clean and safe with the exception of the granddaughters room.  The patient states that the mess just drives her crazy and she cannot stop thinking about it.  Her granddaughter goes to community college and her leaving the house is not an option.  04/03/13 update:  [name].  She was supposed to f/u with Dr Cheryln Manly due to significant anxiety and depression and she cancelled her 3/4, 3/13 and 3/27 visits and never r/s.  She has not seen him since 10/13.  She states that she is just "not going to talk with someone."  She, however, is doing some better in that regard.  Much of her stress surrounds her granddaughter, who lives with  her and is a Ship broker.  The patient states that her granddaughter has now admitted to this, but to seek help.  They were able to get some of the room cleaned up a while ago and that seemed to really help the patient's mental state.  However, the room has reconnected and is now just as it previously was.  She also reports a significant amount of stress over her sister who has end-stage Alzheimer's dementia.  Her sister no longer recognizes her, and the patient realizes when she goes around her sister, it makes her symptoms much worse.  She feels that her memory is getting worse, but nothing like her sisters.  She also thinks that her speech is getting worse.    She is having some headaches, similar to what she had in the past, prior to starting on the Levodopa.  It is over the L forehead, but not in the eye.  It is pounding.  It is daily.  It does not cause n/v or cause photophobia or phonophobia.  It makes her feel "drained."  It does not prevent her from doing things.  There are no lateralizing paresthesias with the headaches.  There are no vision changes.  07/30/13 update:  The pt is on carbidopa/levodopa 25/100 three tablets three times per day.  She is doing well in that  regard.  She did not go to PT because of transportation issues.  She was started on zanaflex last visit for HA and was also started on the exelon patch.  Interestingly, she states that the exelon patch caused headaches but it was started after the headache.  Nonetheless, she stopped it and still has headache.  It starts in the L forehead and radiates over the L side of the head.  She still thinks that the patch made them worse however and also thinks that it caused UTI's.  She is still on zanaflex but isn't sure its helping.  No falls/hallucination/syncope/lightheadedness.  11/27/13 update:  Patient is following up today in the movement disorder clinic.  She is currently on carbidopa/levodopa 25/100, 3 tablets by mouth 3 times per day.  Since our  last visit, I did get a note from Dr. Domingo Cocking at the headache clinic.  He started the patient on Indocin 25 mg 3 times a day and she was told to titrate to 75 mg 3 times a day.  She states that she had SE with that and she was changed to gabapentin 100 mg and that is helping.  He also scheduled her for trigger point injections and an MRI brain.  She did not care for the trigger point injections although they did help.  She has f/u in June.  Walking seems to have been a little slower, but no falls.  Her daughter is with her today and feels that walking has been more tenuous.   No hallucinations.  No diplopia but is having blurry vision.  Does have vision appt in [facility] in few weeks.  05/07/14 update:   Patient is following up today in the movement disorder clinic.  She is currently on carbidopa/levodopa 25/100, 3 tablets by mouth 3 times per day.  Daughter states that she often forgets middle of the day dose but pt denies that.  She takes it at 9-10 am on awakening, 3 pm and bedtime.  She and her daughter both think that it is helpful.   No diplopia.  No falls.  She saw Dr. Domingo Cocking in June and I got a note from him.  She had more trigger point injections.  She is doing really well from a headache standpoint.  She doesn't have to return to him for 6 months (saw him in September).  She continues on remeron for her depression and mood.  She states that she hates that she is confined to the home and depression is tied into that.  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  She then asks if her daughter can come back to the room for the discussion and she did.   She did do therapy with [facility] and she thought that it went well.   10/08/14 update:   Patient is following up today in the movement disorder clinic. She is accompanied by her daughter who supplements the hx.  She is currently on carbidopa/levodopa 25/100, 3 tablets  by mouth 3 times per day.  C/o memory loss.  States that her daughter will tell her to go to the kitchen to get things and she will get there and not be able to remember.  Her daughter thinks that she jumps subjects.  Also c/o feet paresthesias.  Worries that DM has "returned" as used to be diabetic but not anymore.  Last A1C in April was 6.0.  Also  states that right leg below the knee posteriorally she is having paresthesias.  Started after she bent over one day and the knee "popped."  No LBP.  On gabapentin but that is for headache, which is well controlled.  She has been headache free for a long time.  Received a note from Dr. Domingo Cocking recently and pt told to continue meds.    04/10/15 update:  The patient is following up today.  She is on carbidopa/levodopa 25/100, 3 tablets 3 times per day.  Because I was not convinced that the patient had a dementia but her previous neuropsych test years ago said that she did and because some of the patient's depression surrounds the fact that her daughter will not let her drive because of this potential diagnosis, we repeated neuropsych testing.  While there was evidence of cognitive change due to depression, she did not meet the criteria for the diagnosis of dementia.  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  It was recommended that she undergo psychiatric evaluation and treatment, but she refused.  She was again given a brochure for the driving evaluator in [place] to have a driving evaluation.  She reports that her depression was fairly well under control until the end of 2022/12/14 when her sister died and she has been appropriately grieving.  She did have an EMG done since last visit.  This was done on 11/07/2014 There was no evidence of a large fiber peripheral neuropathy, but there was evidence of a chronic, moderate right L2-L4 radiculopathy and a chronic, mild L5 radiculopathy on the right.  She fell one time getting out  of the chair as the right leg gave out.  She also rolled out of the bed at night one time.   She states that she was dreaming.   Fortunately, she did not get hurt with either of these.    10/20/15 update:  The patient follows up today, accompanied by her daughter who supplements the history.  She has a history of vascular parkinsonism and is currently on carbidopa/levodopa 25/100, 3 tablets 3 times per day.  Reports that she is a little "fidgety" and that she just can't stay still.  No new medications.  She also has a history of depression and is prescribed Remeron by her primary care physician.  Her depression mostly surrounds the fact that she cannot leave the house because her daughter will not let her drive.  Depression is improved as she has gotten some cats that keep her company.  We have talked previously about a driving evaluation, as I think she likely could drive from a neurologic standpoint.  Her daughter does not want her to pursue that.  No falls.   She has been headache free on gabapentin and doesn't want to change that  04/22/16 update:  The patient follows up today, accompanied by her daughter supplements the history.  She is on carbidopa/levodopa 25/100, 3 tablets 3 times per day.  She has had 2 falls.  With the first, she got up and made her bed, which she doesn't even remember but she does remember hitting the ground.  With the second one, her toilet was leaking and she fell over the wet towels on the floor and she hit her L head.  She went to the ER for this one, which was 8/29.  She has had a sore spot on the L occiput since. Was given tramadol but made her too sleepy. She had  a CT brain that day and I reviewed it.  It was nonacute and only WMD and mild atrophy.   She denies lightheadedness or near syncope.  She is on Remeron for her depression and feels that she has been stable in that regard.  Reports that "I talk to my cats while my family is working."   She has had no hallucinations.  She  is not particularly active with cardiovascular exercise.  Headaches well controlled on gabapentin 100 mg tid except sore occiput where she fell.  She also uses that for lumbar radiculopathy but reports that is better.    08/23/16 update:  The patient returns today, accompanied by her daughter who supplements the history.  She remains on carbidopa/levodopa 25/100, 3 tablets 3 times per day.  Overall, she feels that she has been a little shaky. Pt states that she fell one time; she was cleaning up a toilet that overflowed and tripped over towels (she told me about this one last time).  With another instance, she woke up and was totally dressed and making bed and had fallen.  She was awake when daughter went in but she doesn't remember if she was sleep walking or not.  Pt denies lightheadedness, near syncope.  No hallucinations.  Mood has been stable on remeron.  No SI/HI.  Has a chronic lumbar radiculopathy and hx of headache, well controlled on gabapentin 100 mg tid.  She has about 1 headache a week and lumbar radiculopathy is resolved.  She thinks that her short term memory hasn't been as good.  She will go into the kitchen and forget why she is there.  Long term memory is good.    05/09/17 update:  Patient seen today in follow-up.  I have not seen the patient for about 9 months.  Reports that she was started on prolia since our last visit.  She is accompanied by her daughter who supplements the history.  Patient is on carbidopa/levodopa 25/100, 3 tablets 3 times per day.  She fell out of the bed while sleeping.  Her daughter isn't convinced she was sleeping because her bed was "made up."  Patient insists she was sleeping because someone was chasing her.  She states that she is hallucinating because she is hearing "engines and music."  She is stumbling more but not falling during the day.  C/o continued short term memory loss.    09/09/17 update: The patient is seen today in follow-up.  She is accompanied by her  daughter who supplements history.  She remains on carbidopa/levodopa 25/100, 3 tablets 3 times per day.  She saw Dr. Si Raider on August 11, 2017 and f/u with her 09/08/17 for results of repeat neurocognitive testing.  This demonstrated similar results as the prior times she has had this.  There is no evidence of dementia.  There is evidence of significant depression and GAD.  last visit, the patient was complaining about REM behavior disorder.  I told her to discontinue the Xanax and we replaced that with a very small dose of clonazepam, 0.25 mg twice per day.  She reports that "I've gotten used to it and now I'm sleeping well."  She is no longer dreaming/acting out dreams.  No falls since our last visit.    She has L trap pain.   PREVIOUS MEDICATIONS: Sinemet; wellbutrin (seizure); prozac (lost efficacy)  ALLERGIES:   Allergies  Allergen Reactions  . Codeine Sulfate     REACTION: hallucinations  . Penicillins Anaphylaxis, Swelling and  Rash  . Wellbutrin [Bupropion] Other (See Comments)    Seizures and hallucinations    CURRENT MEDICATIONS:  Current Outpatient Medications on File Prior to Visit  Medication Sig Dispense Refill  . acetaminophen (TYLENOL) 325 MG tablet Take 650 mg by mouth every 6 (six) hours as needed.    Marland Kitchen albuterol (PROVENTIL HFA;VENTOLIN HFA) 108 (90 Base) MCG/ACT inhaler Inhale 1-2 puffs into the lungs every 6 (six) hours as needed for wheezing or shortness of breath. 1 Inhaler 0  . aspirin 81 MG tablet Take 81 mg by mouth daily.     Marland Kitchen Bioflavonoid Products (GRAPE SEED PO) Take 1 tablet by mouth daily.     . Calcium Carbonate-Vitamin D (CALCIUM + D PO) Take 600 mg by mouth daily.    . carbidopa-levodopa (SINEMET IR) 25-100 MG tablet TAKE 3 TABLETS BY MOUTH 3 TIMES A DAY 810 tablet 1  . cetirizine (ZYRTEC) 10 MG tablet Take 10 mg by mouth daily.    . Cholecalciferol (VITAMIN D3) 1000 UNITS CAPS Take 1 capsule by mouth daily.      . clonazePAM (KLONOPIN) 0.5 MG tablet 1/2  tablet at bedtime, 1/2 tablet daily PRN (Patient taking differently: Take 0.25 mg by mouth 2 (two) times daily. ) 30 tablet 5  . denosumab (PROLIA) 60 MG/ML SOLN injection Inject 60 mg into the skin every 6 (six) months. Administer in upper arm, thigh, or abdomen    . feeding supplement (ENSURE COMPLETE) LIQD Take 237 mLs by mouth 2 (two) times daily between meals. 1 Bottle 11  . FLUoxetine (PROZAC) 20 MG capsule TAKE 1 CAPSULE BY MOUTH EVERY DAY 90 capsule 1  . gabapentin (NEURONTIN) 100 MG capsule TAKE 1 CAPSULE BY MOUTH THREE TIMES A DAY 270 capsule 1  . levofloxacin (LEVAQUIN) 500 MG tablet Take 1 tablet (500 mg total) by mouth daily. 7 tablet 0  . losartan-hydrochlorothiazide (HYZAAR) 50-12.5 MG tablet TAKE 1 TABLET BY MOUTH DAILY 90 tablet 3  . mirtazapine (REMERON) 30 MG tablet TAKE 1 TABLET (30 MG TOTAL) BY MOUTH AT BEDTIME. 90 tablet 1  . Multiple Vitamins-Minerals (CENTRUM SILVER ADULT 50+) TABS Take by mouth.    . ranitidine (ZANTAC) 150 MG tablet Take 150 mg by mouth daily as needed for heartburn.    . simvastatin (ZOCOR) 20 MG tablet TAKE 1 TABLET (20 MG TOTAL) BY MOUTH AT BEDTIME. 90 tablet 3  . vitamin B-12 (CYANOCOBALAMIN) 1000 MCG tablet Take 1,000 mcg by mouth daily.       No current facility-administered medications on file prior to visit.     PAST MEDICAL HISTORY:   Past Medical History:  Diagnosis Date  . Adjustment disorder with depressed mood   . Anxiety   . Bell's palsy    1985  . Dementia   . Depressive disorder, not elsewhere classified   . 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 DVT (deep venous thrombosis), left "early 2000's"   RLE  . Dysmetabolic syndrome X   . Essential hypertension, benign   . HQPRFFMB(846.6) 05/2011   "pretty often since they put me on Parkinson's medicine"  . History of shingles    "as a teen and in my 82's"  . Mixed hyperlipidemia   . Morbid obesity ([place])   . Neurodegenerative gait disorder   .  Parkinson disease [facility])    "they think that's what it is"  . Personal history of thrombophlebitis   . Syncope and collapse 12/06/11   "  this am; I pass out fairly often"  . Type II or unspecified type diabetes mellitus without mention of complication, not stated as uncontrolled 12/06/11   "not anymore; had lap band OR"    PAST SURGICAL HISTORY:   Past Surgical History:  Procedure Laterality Date  . bladder tack  11/1995  . BREAST BIOPSY  05/20/2000   bilaterally  . BREAST CYST ASPIRATION  12/1998   bilaterally  . CARDIAC CATHETERIZATION  2004  . CATARACT EXTRACTION W/ INTRAOCULAR LENS  IMPLANT, BILATERAL    . FRACTURE SURGERY  02/09/2006   bilateral elbows  . FRACTURE SURGERY     right knee  . FRACTURE SURGERY     tib plateau  . KNEE ARTHROSCOPY  12/12/2002   right   . LAPAROSCOPIC GASTRIC BANDING  2008  . SEPTOPLASTY  08/1998   with antral window   . TONSILLECTOMY AND ADENOIDECTOMY  1970  . TUBAL LIGATION  1970's    SOCIAL HISTORY:   Social History   Socioeconomic History  . Marital status: Widowed    Spouse name: Not on file  . Number of children: 2  . Years of education: Not on file  . Highest education level: Not on file  Social Needs  . Financial resource strain: Not on file  . Food insecurity - worry: Not on file  . Food insecurity - inability: Not on file  . Transportation needs - medical: Not on file  . Transportation needs - non-medical: Not on file  Occupational History  . 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 Smoking status: Never Smoker  . Smokeless tobacco: Never Used  Substance and Sexual Activity  . Alcohol use: No    Comment: none  . Drug use: No  . Sexual activity: Not on file  Other Topics Concern  . Not on file  Social History Narrative   Lives with daughter and granddaughter.   Daughter is HPOA.   DNR.    FAMILY HISTORY:   Family Status  Relation Name Status  . Mother   Deceased at age 67       natural causes  . Father  Deceased at age 70       MI  . Sister  Alive       DM-2  . Child  Alive       2, HTN, DM-2, hyperlipidemia  . Neg Hx  (Not Specified)    ROS:  A complete 10 system review of systems was obtained and was unremarkable apart from what is mentioned above.  PHYSICAL EXAMINATION:    VITALS:   Vitals:   09/09/17 0806  BP: 110/64  Pulse: 78  SpO2: 97%  Weight: 157 lb (71.2 kg)  Height: 5\' 2"  (1.575 m)     GEN:  The patient appears stated age and is in NAD. HEENT:  Normocephalic, atraumatic.  There is a slight sore bump on the L occiput.  The mucous membranes are moist. The superficial temporal arteries are without ropiness or tenderness. CV:  RRR Lungs:  CTAB Neck/HEME:  There are no carotid bruits bilaterally. MS:  There is focal tenderness over the mid L trap.  No shoulder pain.  No neck pain  Neurological examination:  Orientation: The patient was A & O x 3.    MocA 04/11/2012 was 25/30, 04/03/13 was 20/30, and 05/07/14 and was 26/30 Cranial nerves: There is L facial droop with decreased  forehead wrinkle on the L.  She has a positive stellwags sign.  There is facial hypomimia.  Pupils are equal round and reactive to light bilaterally. Fundoscopic exam reveals clear margins bilaterally. Extraocular muscles are intact.  There are square wave jerks and difficulty with smooth pursuit.  The speech is hypophonic but fluent and clear. She has no trouble repeating gutteral sounds.  Soft palate rises symmetrically and there is no tongue deviation. Hearing is intact to conversational tone. Sensation: Sensation is intact to light touch throughout.   Motor: Strength is 5/5 in the bilateral upper and lower extremities.   Shoulder shrug is equal and symmetric.  There is no pronator drift.  Movement examination: Tone: There is normal tone in the UE/LE Abnormal movements:  No tremor is noted  Coordination:  There is decremation with RAM's, only  with heel taps on the L.   Gait and Station: The patient pushes off of the chair.  She is somewhat unsteady when ambulating.    LABS:  Lab Results  Component Value Date   WBC 8.3 12/06/2016   HGB 13.3 02/06/2017   HCT 39.0 02/06/2017   MCV 94.3 12/06/2016   PLT 244.0 12/06/2016     Chemistry      Component Value Date/Time   NA 140 03/23/2017 1208   K 4.1 03/23/2017 1208   CL 102 03/23/2017 1208   CO2 33 (H) 03/23/2017 1208   BUN 24 (H) 03/23/2017 1208   CREATININE 0.64 03/23/2017 1208      Component Value Date/Time   CALCIUM 10.2 03/23/2017 1208   ALKPHOS 62 03/23/2017 1208   AST 13 03/23/2017 1208   ALT 3 03/23/2017 1208   BILITOT 0.4 03/23/2017 1208     Lab Results  Component Value Date   TSH 0.97 12/06/2016   Lab Results  Component Value Date   VITAMINB12 1,387 (H) 11/16/2012    Lab Results  Component Value Date   HGBA1C 6.3 12/06/2016   Lab Results  Component Value Date   ESRSEDRATE 18 04/03/2013    ASSESSMENT/PLAN: 1.  Vascular Parkinsonism.  Her primary issue is gait instability with slowness of movement.  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  However, both the patient and her daughter think that levodopa has been beneficial and they think it continues to be beneficial, as I actually considered tapering it.  Neither of them wanted that. symptoms got worse when I tried this in the past.    -she will remain on carbidopa/levodopa 25/100, 3 tablets 3 times per day.  Risks, benefits, side effects and alternative therapies were discussed.  The opportunity to ask questions was given and they were answered to the best of my ability.  The patient expressed understanding and willingness to follow the outlined treatment protocols.  -I had a long talk to her about PT.  Pt doesn't drive.  Will send PT to the home.   2.  Remote hx of seizure.  This  is likely secondary to the initiation of wellbutrin.  She should remain off of this medication.  EEG in the hospital was normal.  She has been seizure-free for several years. 3.  Depression.   -on remeron.  Managed by PCP.  - I think her depression is situational and related to not being able to drive.  They don't want to pursue driving eval.      -  No SI/HI  -I encouraged her to exercise safely.   4.  Memory change  -Because I was not convinced that the patient had a dementia but her previous neuropsych test years ago said that she did and because some of the patient's depression surrounds the fact that her daughter will not let her drive because of this potential diagnosis, we repeated neuropsych testing in 10/2014.  While there was evidence of cognitive change due to depression, she did not meet the criteria for the diagnosis of dementia. Testing was repeated on August 11, 2017. This demonstrated similar results as the prior times she has had this.  There is no evidence of dementia.  There is evidence of significant depression and GAD.  She refuses psychiatry or counseling referral.  Is on prozac and remeron.  Will change prozac to lexapro.  D/c prozac.  Change to lexapro 10 mg daily.  May need to increase 5.  L trap pain  -recommend massage.  She doesn't want to (has a free gift card) 6.  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  I would recommend hearing eval.  Daughter had to cx appt with Dr. Constance Holster and encouraged to make new evaluation 7.  RBD  -would recommend klonopin and d/c the xanax.  Patient falling out of the bed with RBD.  Discussed safety issue.  Try 0.25 mg of klonopin bid to replace the xanax.  Small dose of xanax now so risk for w/d seizure is low.  Risks, benefits, side effects and alternative therapies were discussed.  The opportunity to ask questions was given and they were answered to the best of my  ability.  The patient expressed understanding and willingness to follow the outlined treatment protocols. 8.  Headache  -tried to drop gabapentin to 100 bid but headaches returned so on tid now. 9. Follow up is anticipated in the next 5-49months, sooner should new neurologic issues arise.  Much greater than 50% of this visit was spent in counseling and coordinating care.  Total face to face time:  25 min

## 2017-09-08 ENCOUNTER — Encounter: Payer: Self-pay | Admitting: Psychology

## 2017-09-08 ENCOUNTER — Ambulatory Visit (INDEPENDENT_AMBULATORY_CARE_PROVIDER_SITE_OTHER): Payer: Medicare Other | Admitting: Psychology

## 2017-09-08 DIAGNOSIS — F411 Generalized anxiety disorder: Secondary | ICD-10-CM

## 2017-09-08 DIAGNOSIS — R413 Other amnesia: Secondary | ICD-10-CM

## 2017-09-08 DIAGNOSIS — G214 Vascular parkinsonism: Secondary | ICD-10-CM

## 2017-09-08 DIAGNOSIS — F331 Major depressive disorder, recurrent, moderate: Secondary | ICD-10-CM

## 2017-09-08 NOTE — Patient Instructions (Signed)
  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 No evidence of neurodegenerative dementia. Significant depression and generalized anxiety. Results of cognitive testing are largely within normal limits for age. Compared to her last evaluation completed in 09/2014, all performances are either stable or improved. Areas of improvement include visual-spatial construction, auditory attention, semantic verbal fluency, and executive functioning. Processing speed is variable and considered an area of relative weakness. Learning and memory for new information (both visual and auditory) is considered a strength.  Her test results and her level of functioning do NOT suggest dementia. The fact that her test performances have not declined across the course of 3 evaluations in over 6 years is quite reassuring and certainly does not suggest any neurodegenerative dementia. 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 She continues to present with significant depression and anxiety, which are likely significant contributing factors to perceived cognitive decline. Mental health treatment has been recommended several times in the past and continues to be a primary recommendation.    Recommendations/Plan: Based on the findings of the present evaluation, the following recommendations are offered:  1. Mental health treatment is highly encouraged as the patient is reporting severe depression and anxiety, and give that the patient's mild cognitive impairment is likely exacerbated by mental health factors. Psychiatry evaluation along with individual counseling is recommended. 2. She was reassured that her test results are actually quite strong and do not suggest cognitive decline over several years time.  3. She should engage in activities that provide mental stimulation, social interaction and safe cardiovascular exercise in order to enhance brain health and  quality of life. 4. Neuropsychological re-evaluation will likely not be necessary unless a significant change in cognitive function is observed or reported.

## 2017-09-09 ENCOUNTER — Encounter: Payer: Self-pay | Admitting: Neurology

## 2017-09-09 ENCOUNTER — Ambulatory Visit (INDEPENDENT_AMBULATORY_CARE_PROVIDER_SITE_OTHER): Payer: Medicare Other | Admitting: Neurology

## 2017-09-09 VITALS — BP 110/64 | HR 78 | Ht 62.0 in | Wt 157.0 lb

## 2017-09-09 DIAGNOSIS — G3184 Mild cognitive impairment, so stated: Secondary | ICD-10-CM | POA: Diagnosis not present

## 2017-09-09 DIAGNOSIS — G214 Vascular parkinsonism: Secondary | ICD-10-CM | POA: Diagnosis not present

## 2017-09-09 DIAGNOSIS — F330 Major depressive disorder, recurrent, mild: Secondary | ICD-10-CM

## 2017-09-09 MED ORDER — ESCITALOPRAM OXALATE 10 MG PO TABS: 10.0000 mg | ORAL_TABLET | Freq: Every day | ORAL | 1 refills | Status: DC

## 2017-09-09 NOTE — Patient Instructions (Signed)
1.  Stop prozac 2.  Start lexapro, 10 mg daily

## 2017-09-14 ENCOUNTER — Other Ambulatory Visit: Payer: Medicare Other

## 2017-09-15 ENCOUNTER — Other Ambulatory Visit (INDEPENDENT_AMBULATORY_CARE_PROVIDER_SITE_OTHER): Payer: Medicare Other

## 2017-09-15 DIAGNOSIS — M810 Age-related osteoporosis without current pathological fracture: Secondary | ICD-10-CM | POA: Diagnosis not present

## 2017-09-15 LAB — CALCIUM: Calcium: 10.4 mg/dL (ref 8.4–10.5)

## 2017-09-21 ENCOUNTER — Ambulatory Visit: Payer: Self-pay

## 2017-09-22 ENCOUNTER — Ambulatory Visit (INDEPENDENT_AMBULATORY_CARE_PROVIDER_SITE_OTHER): Payer: Medicare Other | Admitting: Behavioral Health

## 2017-09-22 DIAGNOSIS — M810 Age-related osteoporosis without current pathological fracture: Secondary | ICD-10-CM

## 2017-09-22 MED ORDER — DENOSUMAB 60 MG/ML ~~LOC~~ SOLN
60.0000 mg | Freq: Once | SUBCUTANEOUS | Status: AC
  Administered 2017-09-22: 60 mg via SUBCUTANEOUS

## 2017-09-22 NOTE — Progress Notes (Signed)
Patient came in clinic for Prolia injection. SQ injection was given in the left arm. Patient tolerated it well. No s/s of a reaction prior to patient leaving the nurse visit.

## 2017-10-28 ENCOUNTER — Other Ambulatory Visit: Payer: Self-pay | Admitting: Neurology

## 2017-10-28 MED ORDER — CLONAZEPAM 0.5 MG PO TABS: ORAL_TABLET | ORAL | 5 refills | Status: DC

## 2017-10-28 NOTE — Telephone Encounter (Signed)
Dr. Carles Collet can you send Clonazepam please.

## 2017-10-28 NOTE — Telephone Encounter (Signed)
Patient wants to know why we would not call in her RX's please call

## 2017-10-31 ENCOUNTER — Telehealth: Payer: Self-pay | Admitting: Neurology

## 2017-10-31 ENCOUNTER — Other Ambulatory Visit: Payer: Self-pay | Admitting: Family Medicine

## 2017-10-31 ENCOUNTER — Other Ambulatory Visit: Payer: Self-pay | Admitting: Neurology

## 2017-10-31 NOTE — Telephone Encounter (Signed)
Called Pharmacy, they do have clonazepam sent in from 10/28/17, remeron denied, PCP handling. Called Pt and advsd her.

## 2017-10-31 NOTE — Telephone Encounter (Signed)
Called Pt. She was confused as how to get Remeron from her PCP. I advsd her when I called [facility] in [place] earlier, I spoke directly to the pharmacist, she took a verbal order for clonazepam and she was to rqst refill from PCP. I questioned Pt if she had medications for tonight, she assured me she had enough for a few more nights, and she will check with the pharmacy tomorrow. Advsd her to call back if she had any problems.

## 2017-10-31 NOTE — Telephone Encounter (Signed)
If patient is at the pharmacy, call can be transferred to refill team.  1.     Which medications need to be refilled? (please list name of each medication and dose if know) Mirtazapine and Clonazepam  2.     Which pharmacy/location (including street and city if local pharmacy) is medication to be sent to[facility] / They told her the medication refill was denied.   3.     Do they need a 30 or 90 day supply? Did not say

## 2017-10-31 NOTE — Telephone Encounter (Signed)
Pt left a message saying the pharmacy said she needed to see Dr Tat before they could refill her 2 prescriptions but she said she just saw Dr Tat and wanted to know why they will not be refilled, pt did not say what the names of the medication were

## 2017-12-03 ENCOUNTER — Other Ambulatory Visit: Payer: Self-pay | Admitting: Neurology

## 2017-12-05 NOTE — Progress Notes (Signed)
Subjective:   [name] is a 72 y.o. female who presents for Medicare Annual (Subsequent) preventive examination.  Review of Systems: No ROS.  Medicare Wellness Visit. Additional risk factors are reflected in the social history. Cardiac Risk Factors include: advanced age (>51men, >27 women);diabetes mellitus;dyslipidemia;hypertension Sleep patterns: Sleeps 8-10 hrs. Naps daily. Home Safety/Smoke Alarms: Feels safe in home. Smoke alarms in place.  Living environment; residence and Firearm Safety: Lives with daughter and granddaughter. 1 story home. Shower bench.    Female:        Mammo-utd    Dexa scan-utd        CCS- cologuard 12/15/15-neg  Objective:     Vitals: BP 140/76 (BP Location: Left Arm, Patient Position: Sitting, Cuff Size: Normal)   Pulse 72   Ht 5\' 2"  (1.575 m)   Wt 158 lb 12.8 oz (72 kg)   SpO2 98%   BMI 29.04 kg/m   Body mass index is 29.04 kg/m.  Advanced Directives 12/07/2017 12/06/2016 06/11/2016 03/23/2016 11/26/2013 04/04/2012 03/27/2012  Does Patient Have a Medical Advance Directive? Yes Yes Yes No Patient has advance directive, copy in chart Patient has advance directive, copy not in chart -  Type of Advance Directive [place];Living will [place];Living will [place];Living will;Out of facility DNR (pink MOST or yellow form) - - - -  Does patient want to make changes to medical advance directive? No - Patient declined - No - Patient declined - - - -  Copy of [place] in Chart? Yes No - copy requested No - copy requested - - - -  Would patient like information on creating a medical advance directive? - - - No - patient declined information - - -  Pre-existing out of facility DNR order (yellow form or pink MOST form) - - Physician notified to receive inpatient order - Form returned to patient/family  on discharge Yellow form placed in chart (order not valid for inpatient use)  No;Other (comment)    Tobacco Social History   Tobacco Use  Smoking Status Never Smoker  Smokeless Tobacco Never Used     Counseling given: Not Answered   Clinical Intake:     Pain : No/denies pain                 Past Medical History:  Diagnosis Date  . Adjustment disorder with depressed mood   . Anxiety   . Bell's palsy    1985  . Dementia   . Depressive disorder, not elsewhere classified   . 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 DVT (deep venous thrombosis), left "early 2000's"   RLE  . Dysmetabolic syndrome X   . Essential hypertension, benign   . URKYHCWC(376.2) 05/2011   "pretty often since they put me on Parkinson's medicine"  . History of shingles    "as a teen and in my 30's"  . Mixed hyperlipidemia   . Morbid obesity ([place])   . Neurodegenerative gait disorder   . Parkinson disease [facility])    "they think that's what it is"  . Personal history of thrombophlebitis   . Syncope and collapse 12/06/11   "this am; I pass out fairly often"  . Type II or unspecified type diabetes mellitus without mention of complication, not stated as uncontrolled 12/06/11   "not anymore; had lap band OR"   Past Surgical History:  Procedure Laterality Date  .  bladder tack  11/1995  . BREAST BIOPSY  05/20/2000   bilaterally  . BREAST CYST ASPIRATION  12/1998   bilaterally  . CARDIAC CATHETERIZATION  2004  . CATARACT EXTRACTION W/ INTRAOCULAR LENS  IMPLANT, BILATERAL    . FRACTURE SURGERY  02/09/2006   bilateral elbows  . FRACTURE SURGERY     right knee  . FRACTURE SURGERY     tib plateau  . KNEE ARTHROSCOPY  12/12/2002   right   . LAPAROSCOPIC GASTRIC BANDING  2008  . SEPTOPLASTY  08/1998   with antral window   . TONSILLECTOMY AND ADENOIDECTOMY  1970  . TUBAL LIGATION  1970's   Family History  Problem Relation Age of Onset  . Asthma Mother   . COPD Father   . Heart disease Father   . Diabetes Sister   . Vision loss Sister          legally blind  . Breast cancer Neg Hx    Social History   Socioeconomic History  . Marital status: Widowed    Spouse name: Not on file  . Number of children: 2  . Years of education: Not on file  . Highest education level: Not on file  Occupational History  . 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 Financial resource strain: Not on file  . Food insecurity:    Worry: Not on file    Inability: Not on file  . Transportation needs:    Medical: Not on file    Non-medical: Not on file  Tobacco Use  . Smoking status: Never Smoker  . Smokeless tobacco: Never Used  Substance and Sexual Activity  . Alcohol use: No    Comment: none  . Drug use: No  . Sexual activity: Not on file  Lifestyle  . Physical activity:    Days per week: Not on file    Minutes per session: Not on file  . Stress: Not on file  Relationships  . Social connections:    Talks on phone: Not on file    Gets together: Not on file    Attends religious service: Not on file    Active member of club or organization: Not on file    Attends meetings of clubs or organizations: Not on file    Relationship status: Not on file  Other Topics Concern  . Not on file  Social History Narrative   Lives with daughter and granddaughter.   Daughter is HPOA.   DNR.    Outpatient Encounter Medications as of 12/07/2017  Medication Sig  . aspirin 81 MG tablet Take 81 mg by mouth daily.   Marland Kitchen Bioflavonoid Products (GRAPE SEED PO) Take 1 tablet by mouth daily.   . Calcium Carbonate-Vitamin D (CALCIUM + D PO) Take 600 mg by mouth daily.  . carbidopa-levodopa (SINEMET IR) 25-100 MG tablet TAKE 3 TABLETS BY MOUTH 3 TIMES A DAY  . cetirizine (ZYRTEC) 10 MG tablet Take 10 mg by mouth daily.  . Cholecalciferol (VITAMIN D3) 1000 UNITS CAPS Take 1 capsule by mouth daily.    . clonazePAM (KLONOPIN) 0.5 MG tablet TAKE 1/2 TABLET AT BEDTIME AND TAKE 1/2 TABLET DAILY AS NEEDED   . denosumab (PROLIA) 60 MG/ML SOLN injection Inject 60 mg into the skin every 6 (six) months. Administer in upper arm, thigh, or abdomen  . escitalopram (LEXAPRO) 10 MG tablet TAKE 1 TABLET BY MOUTH EVERY DAY  .  feeding supplement (ENSURE COMPLETE) LIQD Take 237 mLs by mouth 2 (two) times daily between meals. (Patient taking differently: Take 237 mLs by mouth once. )  . gabapentin (NEURONTIN) 100 MG capsule TAKE 1 CAPSULE BY MOUTH THREE TIMES A DAY  . losartan-hydrochlorothiazide (HYZAAR) 50-12.5 MG tablet TAKE 1 TABLET BY MOUTH DAILY  . mirtazapine (REMERON) 30 MG tablet TAKE 1 TABLET (30 MG TOTAL) BY MOUTH AT BEDTIME.  . Multiple Vitamins-Minerals (CENTRUM SILVER ADULT 50+) TABS Take by mouth.  . ranitidine (ZANTAC) 150 MG tablet Take 150 mg by mouth daily as needed for heartburn.  . simvastatin (ZOCOR) 20 MG tablet TAKE 1 TABLET (20 MG TOTAL) BY MOUTH AT BEDTIME.  . vitamin B-12 (CYANOCOBALAMIN) 1000 MCG tablet Take 1,000 mcg by mouth daily.    . [DISCONTINUED] levofloxacin (LEVAQUIN) 500 MG tablet Take 1 tablet (500 mg total) by mouth daily.  Marland Kitchen acetaminophen (TYLENOL) 325 MG tablet Take 650 mg by mouth every 6 (six) hours as needed.  Marland Kitchen albuterol (PROVENTIL HFA;VENTOLIN HFA) 108 (90 Base) MCG/ACT inhaler Inhale 1-2 puffs into the lungs every 6 (six) hours as needed for wheezing or shortness of breath. (Patient not taking: Reported on 12/07/2017)  . [DISCONTINUED] FLUoxetine (PROZAC) 20 MG capsule TAKE 1 CAPSULE BY MOUTH EVERY DAY   No facility-administered encounter medications on file as of 12/07/2017.     Activities of Daily Living In your present state of health, do you have any difficulty performing the following activities: 12/07/2017  Hearing? Y  Comment Dtr states she is scheduling appt with Dr.Rosen ( ENT)   Vision? N  Comment Eye doctor yearly. Wears glasses.   Difficulty concentrating or making decisions? N  Walking or climbing stairs? N  Dressing or bathing? N  Doing errands,  shopping? Y  Comment No longer driving.   Preparing Food and eating ? N  Using the Toilet? N  In the past six months, have you accidently leaked urine? Y  Comment wears pull up  Do you have problems with loss of bowel control? N  Managing your Medications? N  Managing your Finances? N  Housekeeping or managing your Housekeeping? N  Some recent data might be hidden    Patient Care Team: Lucille Passy, MD as PCP - General Tat, Eustace Quail, DO as Consulting Physician (Neurology)    Assessment:   This is a routine wellness examination for [place]. Physical assessment deferred to PCP.  Exercise Activities and Dietary recommendations Current Exercise Habits: The patient does not participate in regular exercise at present, Exercise limited by: None identified Diet (meal preparation, eat out, water intake, caffeinated beverages, dairy products, fruits and vegetables):24 hr recall Breakfast: ensure Lunch: broccoli cheese soup. mt Dew. Dinner: Roast beef and cheese. Chocolate milk shake.     Goals    . DIET - INCREASE WATER INTAKE     Drink at least one bottle per day.       Fall Risk Fall Risk  12/07/2017 09/09/2017 05/09/2017 12/06/2016 08/23/2016  Falls in the past year? No No Yes Yes Yes  Comment - - - pt reports multiple falls without injury -  Number falls in past yr: - - 2 or more 2 or more 2 or more  Injury with Fall? - - No No No  Risk Factor Category  - - High Fall Risk - High Fall Risk  Risk for fall due to : - - - - -  Follow up - - Falls evaluation completed - Falls evaluation completed  Depression Screen PHQ 2/9 Scores 12/07/2017 12/06/2016 12/04/2015 12/03/2014  PHQ - 2 Score 0 1 1 1   PHQ- 9 Score - - - -     Cognitive Function MMSE - Mini Mental State Exam 12/07/2017 09/09/2017 12/06/2016 08/23/2016  Not completed: - Refused - Refused  Orientation to time 4 - 5 -  Orientation to Place 5 - 5 -  Registration 3 - 3 -  Attention/ Calculation 5 - 0 -  Recall 2 - 3 -    Language- name 2 objects 2 - 0 -  Language- repeat 1 - 1 -  Language- follow 3 step command 3 - 3 -  Language- read & follow direction 1 - 0 -  Write a sentence 1 - 0 -  Copy design 1 - 0 -  Total score 28 - 20 -   Montreal Cognitive Assessment  05/07/2014  Visuospatial/ Executive (0/5) 2  Naming (0/3) 2  Attention: Read list of digits (0/2) 1  Attention: Read list of letters (0/1) 1  Attention: Serial 7 subtraction starting at 100 (0/3) 3  Language: Repeat phrase (0/2) 2  Language : Fluency (0/1) 1  Abstraction (0/2) 2  Delayed Recall (0/5) 5  Orientation (0/6) 6  Total 25  Adjusted Score (based on education) 26      Immunization History  Administered Date(s) Administered  . H1N1 08/29/2008  . Influenza Split 04/07/2012  . Influenza Whole 04/16/2010  . Influenza, High Dose Seasonal PF 05/12/2017  . Influenza,inj,Quad PF,6+ Mos 04/03/2013, 06/13/2014, 05/27/2015, 04/22/2016  . Pneumococcal Conjugate-13 11/26/2013  . Pneumococcal Polysaccharide-23 04/16/2010  . Td 08/29/2008  . Tdap 06/21/2012  . Zoster 04/18/2008    Screening Tests Health Maintenance  Topic Date Due  . FOOT EXAM  12/03/2015  . HEMOGLOBIN A1C  06/08/2017  . MAMMOGRAM  02/01/2018  . INFLUENZA VACCINE  02/23/2018  . OPHTHALMOLOGY EXAM  03/29/2018  . Fecal DNA (Cologuard)  12/15/2018  . TETANUS/TDAP  06/21/2022  . DEXA SCAN  Completed  . Hepatitis C Screening  Completed  . PNA vac Low Risk Adult  Completed      Plan:   Follow up with Dr. Deborra Medina as scheduled 12/12/17.   Please schedule your next medicare wellness visit with me in 1 yr.  Go to lab today.  Continue to eat heart healthy diet (full of fruits, vegetables, whole grains, lean protein, water--limit salt, fat, and sugar intake) and increase physical activity as tolerated.  Continue doing brain stimulating activities (puzzles, reading, adult coloring books, staying active) to keep memory sharp.     I have personally reviewed and  noted the following in the patient's chart:   . Medical and social history . Use of alcohol, tobacco or illicit drugs  . Current medications and supplements . Functional ability and status . Nutritional status . Physical activity . Advanced directives . List of other physicians . Hospitalizations, surgeries, and ER visits in previous 12 months . Vitals . Screenings to include cognitive, depression, and falls . Referrals and appointments  In addition, I have reviewed and discussed with patient certain preventive protocols, quality metrics, and best practice recommendations. A written personalized care plan for preventive services as well as general preventive health recommendations were provided to patient.     Shela Nevin, [place]  12/07/2017

## 2017-12-06 ENCOUNTER — Telehealth: Payer: Self-pay

## 2017-12-06 DIAGNOSIS — M810 Age-related osteoporosis without current pathological fracture: Secondary | ICD-10-CM

## 2017-12-06 DIAGNOSIS — E782 Mixed hyperlipidemia: Secondary | ICD-10-CM

## 2017-12-06 DIAGNOSIS — I10 Essential (primary) hypertension: Secondary | ICD-10-CM

## 2017-12-06 NOTE — Telephone Encounter (Signed)
Yes please enter those orders. Thank you.

## 2017-12-06 NOTE — Telephone Encounter (Signed)
Created future orders per TA/also would like POCT A1C/thx dmf

## 2017-12-06 NOTE — Telephone Encounter (Signed)
TA-Pt is coming in tomorrow for AWV with Angel/would you like the following labs drawn? CMP & TSH with Dx HTN I10 Lipid with Dx HLD E78.2 Vit-Alastair Hennes with Dx Osteoporosis M81.0 POC A1C with Dx DM E11.9 Plz advise and I will be happy to create future orders/thx dmf

## 2017-12-07 ENCOUNTER — Ambulatory Visit: Payer: Self-pay

## 2017-12-07 ENCOUNTER — Encounter: Payer: Self-pay | Admitting: Behavioral Health

## 2017-12-07 ENCOUNTER — Ambulatory Visit (INDEPENDENT_AMBULATORY_CARE_PROVIDER_SITE_OTHER): Payer: Medicare Other | Admitting: Behavioral Health

## 2017-12-07 VITALS — BP 140/76 | HR 72 | Ht 62.0 in | Wt 158.8 lb

## 2017-12-07 DIAGNOSIS — I10 Essential (primary) hypertension: Secondary | ICD-10-CM

## 2017-12-07 DIAGNOSIS — M810 Age-related osteoporosis without current pathological fracture: Secondary | ICD-10-CM | POA: Diagnosis not present

## 2017-12-07 DIAGNOSIS — Z0000 Encounter for general adult medical examination without abnormal findings: Secondary | ICD-10-CM | POA: Diagnosis not present

## 2017-12-07 DIAGNOSIS — E782 Mixed hyperlipidemia: Secondary | ICD-10-CM

## 2017-12-07 LAB — COMPREHENSIVE METABOLIC PANEL
ALT: 6 U/L (ref 0–35)
AST: 9 U/L (ref 0–37)
Albumin: 3.9 g/dL (ref 3.5–5.2)
Alkaline Phosphatase: 60 U/L (ref 39–117)
BUN: 25 mg/dL — ABNORMAL HIGH (ref 6–23)
CO2: 31 mEq/L (ref 19–32)
Calcium: 10.6 mg/dL — ABNORMAL HIGH (ref 8.4–10.5)
Chloride: 102 mEq/L (ref 96–112)
Creatinine, Ser: 0.72 mg/dL (ref 0.40–1.20)
GFR: 84.4 mL/min (ref >60.00)
Glucose, Bld: 131 mg/dL — ABNORMAL HIGH (ref 70–99)
Potassium: 4 mEq/L (ref 3.5–5.1)
Sodium: 143 mEq/L (ref 135–145)
Total Bilirubin: 0.3 mg/dL (ref 0.2–1.2)
Total Protein: 6.2 g/dL (ref 6.0–8.3)

## 2017-12-07 LAB — LIPID PANEL
Cholesterol: 146 mg/dL (ref 0–200)
HDL: 61.7 mg/dL (ref >39.00)
LDL Cholesterol: 62 mg/dL (ref 0–99)
NonHDL: 83.91
Total CHOL/HDL Ratio: 2
Triglycerides: 111 mg/dL (ref 0.0–149.0)
VLDL: 22.2 mg/dL (ref 0.0–40.0)

## 2017-12-07 LAB — TSH: TSH: 1.04 u[IU]/mL (ref 0.35–4.50)

## 2017-12-07 LAB — VITAMIN D 25 HYDROXY (VIT D DEFICIENCY, FRACTURES): VITD: 51.53 ng/mL (ref 30.00–100.00)

## 2017-12-07 NOTE — Patient Instructions (Addendum)
Follow up with Dr. Deborra Medina as scheduled 12/12/17.   Please schedule your next medicare wellness visit with me in 1 yr.  Go to lab today.  Continue to eat heart healthy diet (full of fruits, vegetables, whole grains, lean protein, water--limit salt, fat, and sugar intake) and increase physical activity as tolerated.  Continue doing brain stimulating activities (puzzles, reading, adult coloring books, staying active) to keep memory sharp.    [name] , Thank you for taking time to come for your Medicare Wellness Visit. I appreciate your ongoing commitment to your health goals. Please review the following plan we discussed and let me know if I can assist you in the future.   These are the goals we discussed: Goals    . DIET - INCREASE WATER INTAKE     Drink at least one bottle per day.       This is a list of the screening recommended for you and due dates:  Health Maintenance  Topic Date Due  . Complete foot exam   12/03/2015  . Hemoglobin A1C  06/08/2017  . Mammogram  02/01/2018  . Flu Shot  02/23/2018  . Eye exam for diabetics  03/29/2018  . Cologuard (Stool DNA test)  12/15/2018  . Tetanus Vaccine  06/21/2022  . DEXA scan (bone density measurement)  Completed  .  Hepatitis C: One time screening is recommended by Center for Disease Control  (CDC) for  adults born from [birth year] through 1966.   Completed  . Pneumonia vaccines  Completed    Health Maintenance for Postmenopausal Women Menopause is a normal process in which your reproductive ability comes to an end. This process happens gradually over a span of months to years, usually between the ages of 31 and 54. Menopause is complete when you have missed 12 consecutive menstrual periods. It is important to talk with your health care provider about some of the most common conditions that affect postmenopausal women, such as heart disease, cancer, and bone loss (osteoporosis). Adopting a healthy lifestyle and getting preventive care  can help to promote your health and wellness. Those actions can also lower your chances of developing some of these common conditions. What should I know about menopause? During menopause, you may experience a number of symptoms, such as:  Moderate-to-severe hot flashes.  Night sweats.  Decrease in sex drive.  Mood swings.  Headaches.  Tiredness.  Irritability.  Memory problems.  Insomnia.  Choosing to treat or not to treat menopausal changes is an individual decision that you make with your health care provider. What should I know about hormone replacement therapy and supplements? Hormone therapy products are effective for treating symptoms that are associated with menopause, such as hot flashes and night sweats. Hormone replacement carries certain risks, especially as you become older. If you are thinking about using estrogen or estrogen with progestin treatments, discuss the benefits and risks with your health care provider. What should I know about heart disease and stroke? Heart disease, heart attack, and stroke become more likely as you age. This may be due, in part, to the hormonal changes that your body experiences during menopause. These can affect how your body processes dietary fats, triglycerides, and cholesterol. Heart attack and stroke are both medical emergencies. There are many things that you can do to help prevent heart disease and stroke:  Have your blood pressure checked at least every 1-2 years. High blood pressure causes heart disease and increases the risk of stroke.  If you  are 110-59 years old, ask your health care provider if you should take aspirin to prevent a heart attack or a stroke.  Do not use any tobacco products, including cigarettes, chewing tobacco, or electronic cigarettes. If you need help quitting, ask your health care provider.  It is important to eat a healthy diet and maintain a healthy weight. ? Be sure to include plenty of vegetables,  fruits, low-fat dairy products, and lean protein. ? Avoid eating foods that are high in solid fats, added sugars, or salt (sodium).  Get regular exercise. This is one of the most important things that you can do for your health. ? Try to exercise for at least 150 minutes each week. The type of exercise that you do should increase your heart rate and make you sweat. This is known as moderate-intensity exercise. ? Try to do strengthening exercises at least twice each week. Do these in addition to the moderate-intensity exercise.  Know your numbers.Ask your health care provider to check your cholesterol and your blood glucose. Continue to have your blood tested as directed by your health care provider.  What should I know about cancer screening? There are several types of cancer. Take the following steps to reduce your risk and to catch any cancer development as early as possible. Breast Cancer  Practice breast self-awareness. ? This means understanding how your breasts normally appear and feel. ? It also means doing regular breast self-exams. Let your health care provider know about any changes, no matter how small.  If you are 17 or older, have a clinician do a breast exam (clinical breast exam or CBE) every year. Depending on your age, family history, and medical history, it may be recommended that you also have a yearly breast X-ray (mammogram).  If you have a family history of breast cancer, talk with your health care provider about genetic screening.  If you are at high risk for breast cancer, talk with your health care provider about having an MRI and a mammogram every year.  Breast cancer (BRCA) gene test is recommended for women who have family members with BRCA-related cancers. Results of the assessment will determine the need for genetic counseling and BRCA1 and for BRCA2 testing. BRCA-related cancers include these types: ? Breast. This occurs in males or  females. ? Ovarian. ? Tubal. This may also be called fallopian tube cancer. ? Cancer of the abdominal or pelvic lining (peritoneal cancer). ? Prostate. ? Pancreatic.  Cervical, Uterine, and Ovarian Cancer Your health care provider may recommend that you be screened regularly for cancer of the pelvic organs. These include your ovaries, uterus, and vagina. This screening involves a pelvic exam, which includes checking for microscopic changes to the surface of your cervix (Pap test).  For women ages 21-65, health care providers may recommend a pelvic exam and a Pap test every three years. For women ages 24-65, they may recommend the Pap test and pelvic exam, combined with testing for human papilloma virus (HPV), every five years. Some types of HPV increase your risk of cervical cancer. Testing for HPV may also be done on women of any age who have unclear Pap test results.  Other health care providers may not recommend any screening for nonpregnant women who are considered low risk for pelvic cancer and have no symptoms. Ask your health care provider if a screening pelvic exam is right for you.  If you have had past treatment for cervical cancer or a condition that could lead to  cancer, you need Pap tests and screening for cancer for at least 20 years after your treatment. If Pap tests have been discontinued for you, your risk factors (such as having a new sexual partner) need to be reassessed to determine if you should start having screenings again. Some women have medical problems that increase the chance of getting cervical cancer. In these cases, your health care provider may recommend that you have screening and Pap tests more often.  If you have a family history of uterine cancer or ovarian cancer, talk with your health care provider about genetic screening.  If you have vaginal bleeding after reaching menopause, tell your health care provider.  There are currently no reliable tests available  to screen for ovarian cancer.  Lung Cancer Lung cancer screening is recommended for adults 54-27 years old who are at high risk for lung cancer because of a history of smoking. A yearly low-dose CT scan of the lungs is recommended if you:  Currently smoke.  Have a history of at least 30 pack-years of smoking and you currently smoke or have quit within the past 15 years. A pack-year is smoking an average of one pack of cigarettes per day for one year.  Yearly screening should:  Continue until it has been 15 years since you quit.  Stop if you develop a health problem that would prevent you from having lung cancer treatment.  Colorectal Cancer  This type of cancer can be detected and can often be prevented.  Routine colorectal cancer screening usually begins at age 32 and continues through age 60.  If you have risk factors for colon cancer, your health care provider may recommend that you be screened at an earlier age.  If you have a family history of colorectal cancer, talk with your health care provider about genetic screening.  Your health care provider may also recommend using home test kits to check for hidden blood in your stool.  A small camera at the end of a tube can be used to examine your colon directly (sigmoidoscopy or colonoscopy). This is done to check for the earliest forms of colorectal cancer.  Direct examination of the colon should be repeated every 5-10 years until age 41. However, if early forms of precancerous polyps or small growths are found or if you have a family history or genetic risk for colorectal cancer, you may need to be screened more often.  Skin Cancer  Check your skin from head to toe regularly.  Monitor any moles. Be sure to tell your health care provider: ? About any new moles or changes in moles, especially if there is a change in a mole's shape or color. ? If you have a mole that is larger than the size of a pencil eraser.  If any of your  family members has a history of skin cancer, especially at a young age, talk with your health care provider about genetic screening.  Always use sunscreen. Apply sunscreen liberally and repeatedly throughout the day.  Whenever you are outside, protect yourself by wearing long sleeves, pants, a wide-brimmed hat, and sunglasses.  What should I know about osteoporosis? Osteoporosis is a condition in which bone destruction happens more quickly than new bone creation. After menopause, you may be at an increased risk for osteoporosis. To help prevent osteoporosis or the bone fractures that can happen because of osteoporosis, the following is recommended:  If you are 41-3 years old, get at least 1,000 mg of calcium and  at least 600 mg of vitamin D per day.  If you are older than age 65 but younger than age 84, get at least 1,200 mg of calcium and at least 600 mg of vitamin D per day.  If you are older than age 29, get at least 1,200 mg of calcium and at least 800 mg of vitamin D per day.  Smoking and excessive alcohol intake increase the risk of osteoporosis. Eat foods that are rich in calcium and vitamin D, and do weight-bearing exercises several times each week as directed by your health care provider. What should I know about how menopause affects my mental health? Depression may occur at any age, but it is more common as you become older. Common symptoms of depression include:  Low or sad mood.  Changes in sleep patterns.  Changes in appetite or eating patterns.  Feeling an overall lack of motivation or enjoyment of activities that you previously enjoyed.  Frequent crying spells.  Talk with your health care provider if you think that you are experiencing depression. What should I know about immunizations? It is important that you get and maintain your immunizations. These include:  Tetanus, diphtheria, and pertussis (Tdap) booster vaccine.  Influenza every year before the flu season  begins.  Pneumonia vaccine.  Shingles vaccine.  Your health care provider may also recommend other immunizations. This information is not intended to replace advice given to you by your health care provider. Make sure you discuss any questions you have with your health care provider. Document Released: 09/03/2005 Document Revised: 01/30/2016 Document Reviewed: 04/15/2015 Elsevier Interactive Patient Education  2018 [employer].

## 2017-12-08 ENCOUNTER — Ambulatory Visit: Payer: Self-pay

## 2017-12-11 ENCOUNTER — Other Ambulatory Visit: Payer: Self-pay | Admitting: Neurology

## 2017-12-12 ENCOUNTER — Encounter: Payer: Self-pay | Admitting: Primary Care

## 2017-12-12 ENCOUNTER — Encounter: Payer: Self-pay | Admitting: Family Medicine

## 2017-12-12 ENCOUNTER — Ambulatory Visit (INDEPENDENT_AMBULATORY_CARE_PROVIDER_SITE_OTHER): Payer: Medicare Other | Admitting: Family Medicine

## 2017-12-12 VITALS — BP 116/78 | HR 79 | Temp 98.8°F | Ht 62.0 in | Wt 159.2 lb

## 2017-12-12 DIAGNOSIS — M810 Age-related osteoporosis without current pathological fracture: Secondary | ICD-10-CM | POA: Diagnosis not present

## 2017-12-12 DIAGNOSIS — G231 Progressive supranuclear ophthalmoplegia [Steele-Richardson-Olszewski]: Secondary | ICD-10-CM | POA: Diagnosis not present

## 2017-12-12 DIAGNOSIS — I10 Essential (primary) hypertension: Secondary | ICD-10-CM | POA: Diagnosis not present

## 2017-12-12 DIAGNOSIS — Z01419 Encounter for gynecological examination (general) (routine) without abnormal findings: Secondary | ICD-10-CM | POA: Diagnosis not present

## 2017-12-12 DIAGNOSIS — Z87440 Personal history of urinary (tract) infections: Secondary | ICD-10-CM

## 2017-12-12 DIAGNOSIS — E119 Type 2 diabetes mellitus without complications: Secondary | ICD-10-CM | POA: Diagnosis not present

## 2017-12-12 DIAGNOSIS — R569 Unspecified convulsions: Secondary | ICD-10-CM

## 2017-12-12 DIAGNOSIS — Z0000 Encounter for general adult medical examination without abnormal findings: Secondary | ICD-10-CM | POA: Insufficient documentation

## 2017-12-12 DIAGNOSIS — E782 Mixed hyperlipidemia: Secondary | ICD-10-CM

## 2017-12-12 LAB — POCT GLYCOSYLATED HEMOGLOBIN (HGB A1C): Hemoglobin A1C: 6.6

## 2017-12-12 NOTE — Assessment & Plan Note (Signed)
Diet controlled. No changes made today. 

## 2017-12-12 NOTE — Assessment & Plan Note (Signed)
Reviewed preventive care protocols, scheduled due services, and updated immunizations Discussed nutrition, exercise, diet, and healthy lifestyle.  

## 2017-12-12 NOTE — Assessment & Plan Note (Signed)
On prolia 

## 2017-12-12 NOTE — Patient Instructions (Signed)
Great to see you! Happy [birth year]! 

## 2017-12-12 NOTE — Assessment & Plan Note (Signed)
Followed by Dr. Tat. 

## 2017-12-12 NOTE — Progress Notes (Signed)
Subjective:    Patient ID: [name], female    DOB: 02/19/1946, 72 y.o.   MRN: [id number]  HPI  72 yo pleasant female here with her daughter for CPE and follow up of chronic medical conditions.  Annual medicare wellness visit with Glenard Haring, RN on 12/07/17. Note reviewed.  Health Maintenance  Topic Date Due  . FOOT EXAM  12/03/2015  . HEMOGLOBIN A1C  06/08/2017  . MAMMOGRAM  02/01/2018  . INFLUENZA VACCINE  02/23/2018  . OPHTHALMOLOGY EXAM  03/29/2018  . Fecal DNA (Cologuard)  12/15/2018  . TETANUS/TDAP  06/21/2022  . DEXA SCAN  Completed  . Hepatitis C Screening  Completed  . PNA vac Low Risk Adult  Completed    ?PSP- diagnosis uncertain at this point but has found benefit from taking carbidopa/levodopa.  Followed by Dr. Carles Collet.   Was last see non 09/09/17.  Note reviewed.    HLD- decreased zocor to 20 mg daily- well controlled on lower dose.  Lab Results  Component Value Date   CHOL 146 12/07/2017   HDL 61.70 12/07/2017   LDLCALC 62 12/07/2017   TRIG 111.0 12/07/2017   CHOLHDL 2 12/07/2017   Lab Results  Component Value Date   CREATININE 0.72 12/07/2017   DM- diet controlled.   Lab Results  Component Value Date   HGBA1C 6.3 12/06/2016   Depression- symptoms stable with prozac and remeron.  HTN- has been well controlled on Hyzaar. Lab Results  Component Value Date   CREATININE 0.72 12/07/2017    Patient Active Problem List   Diagnosis Date Noted  . Cramping of hands 03/23/2017  . Osteoporosis 01/31/2017  . GERD (gastroesophageal reflux disease) 08/26/2015  . Recurrent UTI 05/27/2015  . Diabetes mellitus type 2, diet-controlled ([place]) 12/03/2014  . History of recurrent UTIs 06/13/2014  . DNR (do not resuscitate) 11/23/2012  . Seizures ([place]) 04/03/2012  . Dysphagia 12/07/2011  . Orthostasis 12/07/2011  . Bell's palsy   . Hypersomnia 01/18/2011  . OSTEOPENIA 05/21/2010  . ANXIETY DEPRESSION 04/16/2010  . OBESITY, MORBID 02/16/2007  .  HYPERLIPIDEMIA, MIXED 01/31/2007  . HYPERTENSION, BENIGN ESSENTIAL 01/31/2007   Past Medical History:  Diagnosis Date  . Adjustment disorder with depressed mood   . Anxiety   . Bell's palsy    1985  . Dementia   . Depressive disorder, not elsewhere classified   . 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 DVT (deep venous thrombosis), left "early 2000's"   RLE  . Dysmetabolic syndrome X   . Essential hypertension, benign   . VQQVZDGL(875.6) 05/2011   "pretty often since they put me on Parkinson's medicine"  . History of shingles    "as a teen and in my 40's"  . Mixed hyperlipidemia   . Morbid obesity ([place])   . Neurodegenerative gait disorder   . Parkinson disease [facility])    "they think that's what it is"  . Personal history of thrombophlebitis   . Syncope and collapse 12/06/11   "this am; I pass out fairly often"  . Type II or unspecified type diabetes mellitus without mention of complication, not stated as uncontrolled 12/06/11   "not anymore; had lap band OR"   Past Surgical History:  Procedure Laterality Date  . bladder tack  11/1995  . BREAST BIOPSY  05/20/2000   bilaterally  . BREAST CYST ASPIRATION  12/1998   bilaterally  . CARDIAC CATHETERIZATION  2004  . CATARACT EXTRACTION W/ INTRAOCULAR LENS  IMPLANT, BILATERAL    . FRACTURE SURGERY  02/09/2006   bilateral elbows  . FRACTURE SURGERY     right knee  . FRACTURE SURGERY     tib plateau  . KNEE ARTHROSCOPY  12/12/2002   right   . LAPAROSCOPIC GASTRIC BANDING  2008  . SEPTOPLASTY  08/1998   with antral window   . TONSILLECTOMY AND ADENOIDECTOMY  1970  . TUBAL LIGATION  1970's   Social History   Tobacco Use  . Smoking status: Never Smoker  . Smokeless tobacco: Never Used  Substance Use Topics  . Alcohol use: No    Comment: none  . Drug use: No   Family History  Problem Relation Age of Onset  . Asthma Mother   . COPD Father   . Heart disease Father   . Diabetes Sister   .  Vision loss Sister        legally blind  . Breast cancer Neg Hx    Allergies  Allergen Reactions  . Codeine Sulfate     REACTION: hallucinations  . Penicillins Anaphylaxis, Swelling and Rash  . Wellbutrin [Bupropion] Other (See Comments)    Seizures and hallucinations   Current Outpatient Medications on File Prior to Visit  Medication Sig Dispense Refill  . acetaminophen (TYLENOL) 325 MG tablet Take 650 mg by mouth every 6 (six) hours as needed.    Marland Kitchen albuterol (PROVENTIL HFA;VENTOLIN HFA) 108 (90 Base) MCG/ACT inhaler Inhale 1-2 puffs into the lungs every 6 (six) hours as needed for wheezing or shortness of breath. (Patient not taking: Reported on 12/07/2017) 1 Inhaler 0  . aspirin 81 MG tablet Take 81 mg by mouth daily.     Marland Kitchen Bioflavonoid Products (GRAPE SEED PO) Take 1 tablet by mouth daily.     . Calcium Carbonate-Vitamin D (CALCIUM + D PO) Take 600 mg by mouth daily.    . carbidopa-levodopa (SINEMET IR) 25-100 MG tablet TAKE 3 TABLETS BY MOUTH 3 TIMES A DAY 810 tablet 1  . cetirizine (ZYRTEC) 10 MG tablet Take 10 mg by mouth daily.    . Cholecalciferol (VITAMIN D3) 1000 UNITS CAPS Take 1 capsule by mouth daily.      . clonazePAM (KLONOPIN) 0.5 MG tablet TAKE 1/2 TABLET AT BEDTIME AND TAKE 1/2 TABLET DAILY AS NEEDED 30 tablet 1  . denosumab (PROLIA) 60 MG/ML SOLN injection Inject 60 mg into the skin every 6 (six) months. Administer in upper arm, thigh, or abdomen    . escitalopram (LEXAPRO) 10 MG tablet TAKE 1 TABLET BY MOUTH EVERY DAY 90 tablet 1  . feeding supplement (ENSURE COMPLETE) LIQD Take 237 mLs by mouth 2 (two) times daily between meals. (Patient taking differently: Take 237 mLs by mouth once. ) 1 Bottle 11  . gabapentin (NEURONTIN) 100 MG capsule TAKE 1 CAPSULE BY MOUTH THREE TIMES A DAY 270 capsule 1  . losartan-hydrochlorothiazide (HYZAAR) 50-12.5 MG tablet TAKE 1 TABLET BY MOUTH DAILY 90 tablet 3  . mirtazapine (REMERON) 30 MG tablet TAKE 1 TABLET (30 MG TOTAL) BY MOUTH AT  BEDTIME. 90 tablet 0  . Multiple Vitamins-Minerals (CENTRUM SILVER ADULT 50+) TABS Take by mouth.    . ranitidine (ZANTAC) 150 MG tablet Take 150 mg by mouth daily as needed for heartburn.    . simvastatin (ZOCOR) 20 MG tablet TAKE 1 TABLET (20 MG TOTAL) BY MOUTH AT BEDTIME. 90 tablet 3  . vitamin B-12 (CYANOCOBALAMIN) 1000 MCG tablet Take 1,000 mcg by mouth daily.  No current facility-administered medications on file prior to visit.    The PMH, PSH, Social History, Family History, Medications, and allergies have been reviewed in [facility], and have been updated if relevant.  Review of Systems  Constitutional: Negative.   HENT: Negative.   Respiratory: Negative.   Cardiovascular: Negative.   Endocrine: Negative.   Genitourinary: Positive for frequency. Negative for dysuria, hematuria and urgency.  Musculoskeletal: Negative for gait problem.  Skin: Negative.   Allergic/Immunologic: Negative.   Psychiatric/Behavioral: Negative for dysphoric mood, self-injury and suicidal ideas.  All other systems reviewed and are negative.     Objective:   Physical Exam There were no vitals taken for this visit.   General:  Well-developed,well-nourished,in no acute distress; alert,appropriate and cooperative throughout examination Head:  normocephalic and atraumatic.   Eyes:  vision grossly intact, PERRL Ears:  R ear normal and L ear normal externally, TMs clear bilaterally Nose:  no external deformity.   Mouth:  good dentition.   Neck:  No deformities, masses, or tenderness noted. Lungs:  Normal respiratory effort, chest expands symmetrically. Lungs are clear to auscultation, no crackles or wheezes. Heart:  Normal rate and regular rhythm. S1 and S2 normal without gallop, murmur, click, rub or other extra sounds. Abdomen:  Bowel sounds positive,abdomen soft and non-tender without masses, organomegaly or hernias noted. Msk:  No deformity or scoliosis noted of thoracic or lumbar spine.    Extremities:  No clubbing, cyanosis, edema, or deformity noted with normal full range of motion of all joints.   Neurologic:  alert & oriented X3 and gait normal.   Skin:  Intact without suspicious lesions or rashes Psych:  Cognition and judgment appear intact. Alert and cooperative with normal attention span and concentration. No apparent delusions, illusions, hallucinations       Assessment & Plan:

## 2017-12-12 NOTE — Assessment & Plan Note (Signed)
Well controlled.  No changes made. 

## 2017-12-14 ENCOUNTER — Ambulatory Visit: Payer: Self-pay | Admitting: Behavioral Health

## 2017-12-26 ENCOUNTER — Other Ambulatory Visit: Payer: Self-pay | Admitting: Family Medicine

## 2018-01-24 ENCOUNTER — Other Ambulatory Visit: Payer: Self-pay | Admitting: Family Medicine

## 2018-02-02 NOTE — Progress Notes (Signed)
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  This patient is accompanied in the office by her child who supplements the history.      She is on carbidopa/levodopa 25/100, 3 tablets 3 times per day.  She is doing well on this medication, without side effects.  She had one fall the day before Thanksgiving.  She was in a parking lot of a restaurant and tripped over something(curb).  She fx her nose and had to have her lip stitched up.    Since then, however, she has done very well.  Her daughter reports that she has been very stable on her feet.  Last visit, she was complaining of a decreased appetite and increased anxiety.  Therefore, I increased her Remeron to 30 mg at night.  This helped with both sleep and appetite and she is very pleased.  She also has a hx of seizure, likely secondary to Wellbutrin.  Last visit, her keppra was d/c.  She has been seizure free since that time.  However, mood and depression continue to be an issue.  I sent her to Dr. Cheryln Manly but she didn't follow up.  She states that she cannot talk to him without her daughter present.  Her biggest issue is that her granddaughter is a Ship broker and she lives with the patient.  Apparently, the majority of the house is clean and safe with the exception of the granddaughters room.  The patient states that the mess just drives her crazy and she cannot stop thinking about it.  Her granddaughter goes to community college and her leaving the house is not an option.  04/03/13 update:  [name].  She was supposed to f/u with Dr Cheryln Manly due to significant anxiety and depression and she cancelled her 3/4, 3/13 and 3/27 visits and never r/s.  She has not seen him since 10/13.  She states that she is just "not going to talk with someone."  She, however, is doing some better in that regard.  Much of her stress surrounds her granddaughter, who lives with  her and is a Ship broker.  The patient states that her granddaughter has now admitted to this, but to seek help.  They were able to get some of the room cleaned up a while ago and that seemed to really help the patient's mental state.  However, the room has reconnected and is now just as it previously was.  She also reports a significant amount of stress over her sister who has end-stage Alzheimer's dementia.  Her sister no longer recognizes her, and the patient realizes when she goes around her sister, it makes her symptoms much worse.  She feels that her memory is getting worse, but nothing like her sisters.  She also thinks that her speech is getting worse.    She is having some headaches, similar to what she had in the past, prior to starting on the Levodopa.  It is over the L forehead, but not in the eye.  It is pounding.  It is daily.  It does not cause n/v or cause photophobia or phonophobia.  It makes her feel "drained."  It does not prevent her from doing things.  There are no lateralizing paresthesias with the headaches.  There are no vision changes.  07/30/13 update:  The pt is on carbidopa/levodopa 25/100 three tablets three times per day.  She is doing well in that  regard.  She did not go to PT because of transportation issues.  She was started on zanaflex last visit for HA and was also started on the exelon patch.  Interestingly, she states that the exelon patch caused headaches but it was started after the headache.  Nonetheless, she stopped it and still has headache.  It starts in the L forehead and radiates over the L side of the head.  She still thinks that the patch made them worse however and also thinks that it caused UTI's.  She is still on zanaflex but isn't sure its helping.  No falls/hallucination/syncope/lightheadedness.  11/27/13 update:  Patient is following up today in the movement disorder clinic.  She is currently on carbidopa/levodopa 25/100, 3 tablets by mouth 3 times per day.  Since our  last visit, I did get a note from Dr. Domingo Cocking at the headache clinic.  He started the patient on Indocin 25 mg 3 times a day and she was told to titrate to 75 mg 3 times a day.  She states that she had SE with that and she was changed to gabapentin 100 mg and that is helping.  He also scheduled her for trigger point injections and an MRI brain.  She did not care for the trigger point injections although they did help.  She has f/u in June.  Walking seems to have been a little slower, but no falls.  Her daughter is with her today and feels that walking has been more tenuous.   No hallucinations.  No diplopia but is having blurry vision.  Does have vision appt in [facility] in few weeks.  05/07/14 update:   Patient is following up today in the movement disorder clinic.  She is currently on carbidopa/levodopa 25/100, 3 tablets by mouth 3 times per day.  Daughter states that she often forgets middle of the day dose but pt denies that.  She takes it at 9-10 am on awakening, 3 pm and bedtime.  She and her daughter both think that it is helpful.   No diplopia.  No falls.  She saw Dr. Domingo Cocking in June and I got a note from him.  She had more trigger point injections.  She is doing really well from a headache standpoint.  She doesn't have to return to him for 6 months (saw him in September).  She continues on remeron for her depression and mood.  She states that she hates that she is confined to the home and depression is tied into that.  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  She then asks if her daughter can come back to the room for the discussion and she did.   She did do therapy with [facility] and she thought that it went well.   10/08/14 update:   Patient is following up today in the movement disorder clinic. She is accompanied by her daughter who supplements the hx.  She is currently on carbidopa/levodopa 25/100, 3 tablets  by mouth 3 times per day.  C/o memory loss.  States that her daughter will tell her to go to the kitchen to get things and she will get there and not be able to remember.  Her daughter thinks that she jumps subjects.  Also c/o feet paresthesias.  Worries that DM has "returned" as used to be diabetic but not anymore.  Last A1C in April was 6.0.  Also  states that right leg below the knee posteriorally she is having paresthesias.  Started after she bent over one day and the knee "popped."  No LBP.  On gabapentin but that is for headache, which is well controlled.  She has been headache free for a long time.  Received a note from Dr. Domingo Cocking recently and pt told to continue meds.    04/10/15 update:  The patient is following up today.  She is on carbidopa/levodopa 25/100, 3 tablets 3 times per day.  Because I was not convinced that the patient had a dementia but her previous neuropsych test years ago said that she did and because some of the patient's depression surrounds the fact that her daughter will not let her drive because of this potential diagnosis, we repeated neuropsych testing.  While there was evidence of cognitive change due to depression, she did not meet the criteria for the diagnosis of dementia.  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  It was recommended that she undergo psychiatric evaluation and treatment, but she refused.  She was again given a brochure for the driving evaluator in [place] to have a driving evaluation.  She reports that her depression was fairly well under control until the end of 2022/12/14 when her sister died and she has been appropriately grieving.  She did have an EMG done since last visit.  This was done on 11/07/2014 There was no evidence of a large fiber peripheral neuropathy, but there was evidence of a chronic, moderate right L2-L4 radiculopathy and a chronic, mild L5 radiculopathy on the right.  She fell one time getting out  of the chair as the right leg gave out.  She also rolled out of the bed at night one time.   She states that she was dreaming.   Fortunately, she did not get hurt with either of these.    10/20/15 update:  The patient follows up today, accompanied by her daughter who supplements the history.  She has a history of vascular parkinsonism and is currently on carbidopa/levodopa 25/100, 3 tablets 3 times per day.  Reports that she is a little "fidgety" and that she just can't stay still.  No new medications.  She also has a history of depression and is prescribed Remeron by her primary care physician.  Her depression mostly surrounds the fact that she cannot leave the house because her daughter will not let her drive.  Depression is improved as she has gotten some cats that keep her company.  We have talked previously about a driving evaluation, as I think she likely could drive from a neurologic standpoint.  Her daughter does not want her to pursue that.  No falls.   She has been headache free on gabapentin and doesn't want to change that  04/22/16 update:  The patient follows up today, accompanied by her daughter supplements the history.  She is on carbidopa/levodopa 25/100, 3 tablets 3 times per day.  She has had 2 falls.  With the first, she got up and made her bed, which she doesn't even remember but she does remember hitting the ground.  With the second one, her toilet was leaking and she fell over the wet towels on the floor and she hit her L head.  She went to the ER for this one, which was 8/29.  She has had a sore spot on the L occiput since. Was given tramadol but made her too sleepy. She had  a CT brain that day and I reviewed it.  It was nonacute and only WMD and mild atrophy.   She denies lightheadedness or near syncope.  She is on Remeron for her depression and feels that she has been stable in that regard.  Reports that "I talk to my cats while my family is working."   She has had no hallucinations.  She  is not particularly active with cardiovascular exercise.  Headaches well controlled on gabapentin 100 mg tid except sore occiput where she fell.  She also uses that for lumbar radiculopathy but reports that is better.    08/23/16 update:  The patient returns today, accompanied by her daughter who supplements the history.  She remains on carbidopa/levodopa 25/100, 3 tablets 3 times per day.  Overall, she feels that she has been a little shaky. Pt states that she fell one time; she was cleaning up a toilet that overflowed and tripped over towels (she told me about this one last time).  With another instance, she woke up and was totally dressed and making bed and had fallen.  She was awake when daughter went in but she doesn't remember if she was sleep walking or not.  Pt denies lightheadedness, near syncope.  No hallucinations.  Mood has been stable on remeron.  No SI/HI.  Has a chronic lumbar radiculopathy and hx of headache, well controlled on gabapentin 100 mg tid.  She has about 1 headache a week and lumbar radiculopathy is resolved.  She thinks that her short term memory hasn't been as good.  She will go into the kitchen and forget why she is there.  Long term memory is good.    05/09/17 update:  Patient seen today in follow-up.  I have not seen the patient for about 9 months.  Reports that she was started on prolia since our last visit.  She is accompanied by her daughter who supplements the history.  Patient is on carbidopa/levodopa 25/100, 3 tablets 3 times per day.  She fell out of the bed while sleeping.  Her daughter isn't convinced she was sleeping because her bed was "made up."  Patient insists she was sleeping because someone was chasing her.  She states that she is hallucinating because she is hearing "engines and music."  She is stumbling more but not falling during the day.  C/o continued short term memory loss.    09/09/17 update: The patient is seen today in follow-up.  She is accompanied by her  daughter who supplements history.  She remains on carbidopa/levodopa 25/100, 3 tablets 3 times per day.  She saw Dr. Si Raider on August 11, 2017 and f/u with her 09/08/17 for results of repeat neurocognitive testing.  This demonstrated similar results as the prior times she has had this.  There is no evidence of dementia.  There is evidence of significant depression and GAD.  last visit, the patient was complaining about REM behavior disorder.  I told her to discontinue the Xanax and we replaced that with a very small dose of clonazepam, 0.25 mg twice per day.  She reports that "I've gotten used to it and now I'm sleeping well."  She is no longer dreaming/acting out dreams.  No falls since our last visit.    She has L trap pain.  02/07/18 update: Patient is seen today in follow-up for vascular parkinsonism.  She is accompanied by her daughter who supplements history.  Patient is on carbidopa/levodopa 25/100, 3 tablets 3 times per day.  She has had no falls since last visit.   Daughter thinks that she has been more stable since our last visit.  [place]   No lightheadedness or near syncope.  Prozac was stopped last visit.  She was started on Lexapro.  She reports that this was helpful.  Mood is good and she thinks that lexapro helps RBD.  She is also on a small dose of clonazepam, 0.25 mg twice per day.  She reports that this is helping anxiety and sleep.  Records have been reviewed since our last visit.  Patient saw her primary care physician on Dec 12, 2017.     PREVIOUS MEDICATIONS: Sinemet; wellbutrin (seizure); prozac (lost efficacy)  ALLERGIES:   Allergies  Allergen Reactions  . Codeine Sulfate     REACTION: hallucinations  . Penicillins Anaphylaxis, Swelling and Rash  . Wellbutrin [Bupropion] Other (See Comments)    Seizures and hallucinations    CURRENT MEDICATIONS:  Current Outpatient Medications on File Prior to Visit  Medication Sig Dispense Refill  . acetaminophen  (TYLENOL) 325 MG tablet Take 650 mg by mouth every 6 (six) hours as needed.    Marland Kitchen aspirin 81 MG tablet Take 81 mg by mouth daily.     Marland Kitchen Bioflavonoid Products (GRAPE SEED PO) Take 1 tablet by mouth daily.     . Calcium Carbonate-Vitamin D (CALCIUM + D PO) Take 600 mg by mouth daily.    . carbidopa-levodopa (SINEMET IR) 25-100 MG tablet TAKE 3 TABLETS BY MOUTH 3 TIMES A DAY 810 tablet 1  . cetirizine (ZYRTEC) 10 MG tablet Take 10 mg by mouth daily.    . Cholecalciferol (VITAMIN D3) 1000 UNITS CAPS Take 1 capsule by mouth daily.      . clonazePAM (KLONOPIN) 0.5 MG tablet AKE 1/2 TABLET BY MOUTH AT BEDTIME AND 1/2 DAILY AS NEEDED 30 tablet 5  . denosumab (PROLIA) 60 MG/ML SOLN injection Inject 60 mg into the skin every 6 (six) months. Administer in upper arm, thigh, or abdomen    . escitalopram (LEXAPRO) 10 MG tablet TAKE 1 TABLET BY MOUTH EVERY DAY 90 tablet 1  . feeding supplement (ENSURE COMPLETE) LIQD Take 237 mLs by mouth 2 (two) times daily between meals. (Patient taking differently: Take 237 mLs by mouth once. ) 1 Bottle 11  . gabapentin (NEURONTIN) 100 MG capsule TAKE 1 CAPSULE BY MOUTH THREE TIMES A DAY 270 capsule 1  . losartan-hydrochlorothiazide (HYZAAR) 50-12.5 MG tablet TAKE 1 TABLET BY MOUTH DAILY 90 tablet 3  . mirtazapine (REMERON) 30 MG tablet TAKE 1 TABLET (30 MG TOTAL) BY MOUTH AT BEDTIME. 90 tablet 3  . Multiple Vitamins-Minerals (CENTRUM SILVER ADULT 50+) TABS Take by mouth.    . ranitidine (ZANTAC) 150 MG tablet Take 150 mg by mouth daily as needed for heartburn.    . simvastatin (ZOCOR) 20 MG tablet TAKE 1 TABLET (20 MG TOTAL) BY MOUTH AT BEDTIME. 90 tablet 3  . vitamin B-12 (CYANOCOBALAMIN) 1000 MCG tablet Take 1,000 mcg by mouth daily.       No current facility-administered medications on file prior to visit.     PAST MEDICAL HISTORY:   Past Medical History:  Diagnosis Date  . Adjustment disorder with depressed mood   . Anxiety   . Bell's palsy    1985  . Dementia    . Depressive disorder, not elsewhere classified   . 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  DVT (deep venous thrombosis), left "early 2000's"   RLE  . Dysmetabolic syndrome X   . Essential hypertension, benign   . HKVQQVZD(638.7) 05/2011   "pretty often since they put me on Parkinson's medicine"  . History of shingles    "as a teen and in my 32's"  . Mixed hyperlipidemia   . Morbid obesity ([place])   . Neurodegenerative gait disorder   . Parkinson disease [facility])    "they think that's what it is"  . Personal history of thrombophlebitis   . Syncope and collapse 12/06/11   "this am; I pass out fairly often"  . Type II or unspecified type diabetes mellitus without mention of complication, not stated as uncontrolled 12/06/11   "not anymore; had lap band OR"    PAST SURGICAL HISTORY:   Past Surgical History:  Procedure Laterality Date  . bladder tack  11/1995  . BREAST BIOPSY  05/20/2000   bilaterally  . BREAST CYST ASPIRATION  12/1998   bilaterally  . CARDIAC CATHETERIZATION  2004  . CATARACT EXTRACTION W/ INTRAOCULAR LENS  IMPLANT, BILATERAL    . FRACTURE SURGERY  02/09/2006   bilateral elbows  . FRACTURE SURGERY     right knee  . FRACTURE SURGERY     tib plateau  . KNEE ARTHROSCOPY  12/12/2002   right   . LAPAROSCOPIC GASTRIC BANDING  2008  . SEPTOPLASTY  08/1998   with antral window   . TONSILLECTOMY AND ADENOIDECTOMY  1970  . TUBAL LIGATION  1970's    SOCIAL HISTORY:   Social History   Socioeconomic History  . Marital status: Widowed    Spouse name: Not on file  . Number of children: 2  . Years of education: Not on file  . Highest education level: Not on file  Occupational History  . 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 Financial resource strain: Not on file  . Food insecurity:    Worry: Not on file    Inability: Not on file  . Transportation needs:    Medical: Not  on file    Non-medical: Not on file  Tobacco Use  . Smoking status: Never Smoker  . Smokeless tobacco: Never Used  Substance and Sexual Activity  . Alcohol use: No    Comment: none  . Drug use: No  . Sexual activity: Not on file  Lifestyle  . Physical activity:    Days per week: Not on file    Minutes per session: Not on file  . Stress: Not on file  Relationships  . Social connections:    Talks on phone: Not on file    Gets together: Not on file    Attends religious service: Not on file    Active member of club or organization: Not on file    Attends meetings of clubs or organizations: Not on file    Relationship status: Not on file  . Intimate partner violence:    Fear of current or ex partner: Not on file    Emotionally abused: Not on file    Physically abused: Not on file    Forced sexual activity: Not on file  Other Topics Concern  . Not on file  Social History Narrative   Lives with daughter and granddaughter.   Daughter is HPOA.   DNR.    FAMILY HISTORY:   Family Status  Relation Name Status  . Mother  Deceased at age 64       natural causes  . Father  Deceased at age 85       MI  . Sister  Alive       DM-2  . Child  Alive       2, HTN, DM-2, hyperlipidemia  . Neg Hx  (Not Specified)    ROS:  A complete 10 system review of systems was obtained and was unremarkable apart from what is mentioned above.  PHYSICAL EXAMINATION:    VITALS:   Vitals:   02/07/18 0801  BP: 124/78  Pulse: 78  SpO2: 96%  Weight: 156 lb (70.8 kg)  Height: 5\' 2"  (1.575 m)     GEN:  The patient appears stated age and is in NAD. HEENT:  Normocephalic, atraumatic.  There is a slight sore bump on the L occiput.  The mucous membranes are moist. The superficial temporal arteries are without ropiness or tenderness. CV:  RRR Lungs:  CTAB Neck/HEME:  There are no carotid bruits bilaterally. MS:  There is focal tenderness over the mid L trap.  No shoulder pain.  No neck  pain  Neurological examination:  Orientation: The patient was A & O x 3.    MocA 04/11/2012 was 25/30, 04/03/13 was 20/30, and 05/07/14 and was 26/30 Cranial nerves: There is L facial droop with decreased forehead wrinkle on the L.  She has a positive stellwags sign.  There is facial hypomimia.  Pupils are equal round and reactive to light bilaterally. Fundoscopic exam reveals clear margins bilaterally. Extraocular muscles are intact.  There are square wave jerks and difficulty with smooth pursuit.  The speech is hypophonic but fluent and clear. She has no trouble repeating gutteral sounds.  Soft palate rises symmetrically and there is no tongue deviation. Hearing is intact to conversational tone. Sensation: Sensation is intact to light touch throughout.   Motor: Strength is 5/5 in the bilateral upper and lower extremities.   Shoulder shrug is equal and symmetric.  There is no pronator drift.  Movement examination: Tone: There is normal tone in the UE/LE Abnormal movements:  No tremor is noted  Coordination:  There is decremation with RAM's with heel and toe taps bilaterally. Gait and Station: The patient pushes off of the chair.  She actually walks well down the hall today and is steady  LABS:  Lab Results  Component Value Date   WBC 8.3 12/06/2016   HGB 13.3 02/06/2017   HCT 39.0 02/06/2017   MCV 94.3 12/06/2016   PLT 244.0 12/06/2016     Chemistry      Component Value Date/Time   NA 143 12/07/2017 0844   K 4.0 12/07/2017 0844   CL 102 12/07/2017 0844   CO2 31 12/07/2017 0844   BUN 25 (H) 12/07/2017 0844   CREATININE 0.72 12/07/2017 0844      Component Value Date/Time   CALCIUM 10.6 (H) 12/07/2017 0844   ALKPHOS 60 12/07/2017 0844   AST 9 12/07/2017 0844   ALT 6 12/07/2017 0844   BILITOT 0.3 12/07/2017 0844     Lab Results  Component Value Date   TSH 1.04 12/07/2017   Lab Results  Component Value Date   VITAMINB12 1,387 (H) 11/16/2012    Lab Results  Component  Value Date   HGBA1C 6.6 12/12/2017   Lab Results  Component Value Date   ESRSEDRATE 18 04/03/2013    ASSESSMENT/PLAN: 1.  Vascular Parkinsonism.  Her primary issue is gait instability  with slowness of movement.  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  However, both the patient and her daughter think that levodopa has been beneficial and they think it continues to be beneficial, as I actually considered tapering it.  Neither of them wanted that. symptoms got worse when I tried this in the past.    -she will remain on carbidopa/levodopa 25/100, 3 tablets 3 times per day.  Risks, benefits, side effects and alternative therapies were discussed.  The opportunity to ask questions was given and they were answered to the best of my ability.  The patient expressed understanding and willingness to follow the outlined treatment protocols.  -invited her to participate in PARTS program but not interested 2.  Remote hx of seizure.  This is likely secondary to the initiation of wellbutrin.  She should remain off of this medication.  EEG in the hospital was normal.  She has been seizure-free for several years. 3.  Depression.   -on remeron.  Managed by PCP.  - I think her depression is situational and related to not being able to drive.  They don't want to pursue driving eval.      -No SI/HI  -I encouraged her to exercise safely.    -better on lexapro than prozac 4.  Memory change  -is pseudodementia.  No evidence of true dementia 5.  RBD  -off xanax.  On klonopin and doing well.  Takes 0.25 mg bid (morning for anxiety) 6.  Headache  -tried to drop gabapentin to 100 bid but headaches returned so on tid now. 7. F/u 6 months

## 2018-02-07 ENCOUNTER — Ambulatory Visit (INDEPENDENT_AMBULATORY_CARE_PROVIDER_SITE_OTHER): Payer: Medicare Other | Admitting: Neurology

## 2018-02-07 ENCOUNTER — Encounter: Payer: Self-pay | Admitting: Neurology

## 2018-02-07 VITALS — BP 124/78 | HR 78 | Ht 62.0 in | Wt 156.0 lb

## 2018-02-07 DIAGNOSIS — F330 Major depressive disorder, recurrent, mild: Secondary | ICD-10-CM

## 2018-02-07 DIAGNOSIS — G4752 REM sleep behavior disorder: Secondary | ICD-10-CM

## 2018-02-07 DIAGNOSIS — G214 Vascular parkinsonism: Secondary | ICD-10-CM

## 2018-02-17 ENCOUNTER — Other Ambulatory Visit: Payer: Self-pay | Admitting: Family Medicine

## 2018-03-03 ENCOUNTER — Telehealth: Payer: Self-pay | Admitting: Behavioral Health

## 2018-03-03 NOTE — Telephone Encounter (Signed)
Received Summary of Benefits for Prolia injection. The estimated out of pocket expense is $0. No PA is required. Patient's daughter, Docia Furl was made aware of the above information & scheduled nurse visit appointment for 03/23/18 at 8:40 AM.

## 2018-03-23 ENCOUNTER — Ambulatory Visit (INDEPENDENT_AMBULATORY_CARE_PROVIDER_SITE_OTHER): Payer: Medicare Other | Admitting: Behavioral Health

## 2018-03-23 DIAGNOSIS — M810 Age-related osteoporosis without current pathological fracture: Secondary | ICD-10-CM | POA: Diagnosis not present

## 2018-03-23 MED ORDER — DENOSUMAB 60 MG/ML ~~LOC~~ SOSY
60.0000 mg | PREFILLED_SYRINGE | Freq: Once | SUBCUTANEOUS | Status: AC
  Administered 2018-03-23: 60 mg via SUBCUTANEOUS

## 2018-03-23 NOTE — Progress Notes (Signed)
Patient came in clinic today for Prolia injection. SQ injection was given in the left arm. Patient tolerated it well. There were no signs or symptoms of a reaction prior to the patient leaving the nurse visit.

## 2018-04-03 ENCOUNTER — Ambulatory Visit (INDEPENDENT_AMBULATORY_CARE_PROVIDER_SITE_OTHER): Payer: Medicare Other | Admitting: Primary Care

## 2018-04-03 ENCOUNTER — Encounter: Payer: Self-pay | Admitting: Primary Care

## 2018-04-03 VITALS — BP 126/76 | HR 74 | Temp 97.8°F | Ht 62.0 in | Wt 157.2 lb

## 2018-04-03 DIAGNOSIS — G231 Progressive supranuclear ophthalmoplegia [Steele-Richardson-Olszewski]: Secondary | ICD-10-CM

## 2018-04-03 DIAGNOSIS — E119 Type 2 diabetes mellitus without complications: Secondary | ICD-10-CM

## 2018-04-03 DIAGNOSIS — R300 Dysuria: Secondary | ICD-10-CM

## 2018-04-03 DIAGNOSIS — Z23 Encounter for immunization: Secondary | ICD-10-CM | POA: Diagnosis not present

## 2018-04-03 DIAGNOSIS — M810 Age-related osteoporosis without current pathological fracture: Secondary | ICD-10-CM

## 2018-04-03 DIAGNOSIS — I10 Essential (primary) hypertension: Secondary | ICD-10-CM

## 2018-04-03 DIAGNOSIS — F341 Dysthymic disorder: Secondary | ICD-10-CM

## 2018-04-03 DIAGNOSIS — E782 Mixed hyperlipidemia: Secondary | ICD-10-CM

## 2018-04-03 DIAGNOSIS — R569 Unspecified convulsions: Secondary | ICD-10-CM

## 2018-04-03 LAB — POC URINALSYSI DIPSTICK (AUTOMATED)
Bilirubin, UA: NEGATIVE
Blood, UA: NEGATIVE
Glucose, UA: NEGATIVE
Ketones, UA: NEGATIVE
Nitrite, UA: POSITIVE
Protein, UA: NEGATIVE
Spec Grav, UA: 1.02 (ref 1.010–1.025)
Urobilinogen, UA: 0.2 E.U./dL
pH, UA: 6 (ref 5.0–8.0)

## 2018-04-03 MED ORDER — SULFAMETHOXAZOLE-TRIMETHOPRIM 800-160 MG PO TABS: 1.0000 | ORAL_TABLET | Freq: Two times a day (BID) | ORAL | 0 refills | Status: DC

## 2018-04-03 MED ORDER — ZOSTER VAC RECOMB ADJUVANTED 50 MCG/0.5ML IM SUSR: 0.5000 mL | Freq: Once | INTRAMUSCULAR | 1 refills | Status: AC

## 2018-04-03 NOTE — Assessment & Plan Note (Signed)
Recent lipid panel stable. Continue Simvastatin.

## 2018-04-03 NOTE — Patient Instructions (Signed)
Start Bactrim DS (sulfamethoxazole/trimethoprim) tablets for urinary tract infection. Take 1 tablet by mouth twice daily for 5 days.  I will defer refills of your clonazepam to your neurologist. Try to limit use of the clonazepam if possible. It's very addictive and puts you at risk for falling.  We will see you next May for your physical.  It was a pleasure to see you today!

## 2018-04-03 NOTE — Assessment & Plan Note (Signed)
Stable in the office today, continue losartan-HCTZ as prescribed. Reviewed recent BMP.

## 2018-04-03 NOTE — Assessment & Plan Note (Signed)
Following with neurology, continue current regimen. 

## 2018-04-03 NOTE — Progress Notes (Signed)
Subjective:    Patient ID: [name], female    DOB: [birth year], 72 y.o.   MRN: [id number]  HPI  [name] is a 72 year old female who presents today to transfer care from [name]. She would also like a prescription for the Shingles vaccination.   1) Type 2 Diabetes: Currently diet controlled. Her last A1C was 6.6 in late May 2019. She had gastric lab band surgery at age 20 and has been able to keep her diabetes in control since.  2) Parkinson's Disease/Progressive Supranuclear Palsy: Currently following with Neurology and is managed on carbidopa/levodopa 25/100 (3 tablets) three times daily, gabapentin 100 mg TID. She is also managed on Remeron 30 mg at bedtime for decreased appetite. She was previously managed on Keppra for seizure disorder which was thought to be due to Wellbutrin, this was discontinued several years ago. She has been seizure free since. She denies recent falls.  3) Hyperlipidemia: Currently managed on Simvastatin 20 mg. Her last lipid panel was in May 2019 with LDL of 62.   4) Anxiety and Depression: Currently managed on Lexapro 10 mg daily and Clonazepam 0.5 mg (1/2 tablet) BID. She takes her clonazepam twice daily, everyday for her "nerves". She was told to do this by her neurologist. She is also managed on Remeron 30 mg.   5) Essential Hypertension: Currently managed on losartan-HCTZ 50-12.5 mg. She denies chest pain, dizziness, shortness of breath.   BP Readings from Last 3 Encounters:  04/03/18 126/76  02/07/18 124/78  12/12/17 116/78   6) Dysuria: Present for the past 1-2 weeks. Her [name] is with her today who endorses a foul smelling urine odor over the last 1-2 weeks ago. Also noticing more incontinence. She's been taking AZO OTC for her symptoms. Denies hematuria, fevers, pelvic pressure. History of UTI's, typically gets 2-3 annually.   Review of Systems  Eyes: Negative for visual disturbance.  Respiratory: Negative for shortness of breath.     Cardiovascular: Negative for chest pain.  Genitourinary: Positive for dysuria. Negative for frequency, hematuria and vaginal discharge.       Foul smelling urine  Neurological: Negative for dizziness and headaches.  Psychiatric/Behavioral:       Feels well managed on current regimen.       Past Medical History:  Diagnosis Date  . Adjustment disorder with depressed mood   . Anxiety   . Bell's palsy    1985  . Dementia   . Depressive disorder, not elsewhere classified   . 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 DVT (deep venous thrombosis), left "early 2000's"   RLE  . Dysmetabolic syndrome X   . Essential hypertension, benign   . VXBLTJQZ(009.2) 05/2011   "pretty often since they put me on Parkinson's medicine"  . History of shingles    "as a teen and in my 29's"  . Mixed hyperlipidemia   . Morbid obesity ([place])   . Neurodegenerative gait disorder   . Parkinson disease [facility])    "they think that's what it is"  . Personal history of thrombophlebitis   . Syncope and collapse 12/06/11   "this am; I pass out fairly often"  . Type II or unspecified type diabetes mellitus without mention of complication, not stated as uncontrolled 12/06/11   "not anymore; had lap band OR"     Social History   Socioeconomic History  . Marital status: Widowed    Spouse name: Not  on file  . Number of children: 2  . Years of education: Not on file  . Highest education level: Not on file  Occupational History  . 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 Financial resource strain: Not on file  . Food insecurity:    Worry: Not on file    Inability: Not on file  . Transportation needs:    Medical: Not on file    Non-medical: Not on file  Tobacco Use  . Smoking status: Never Smoker  . Smokeless tobacco: Never Used  Substance and Sexual Activity  . Alcohol use: No    Comment: none  . Drug use: No  .  Sexual activity: Not on file  Lifestyle  . Physical activity:    Days per week: Not on file    Minutes per session: Not on file  . Stress: Not on file  Relationships  . Social connections:    Talks on phone: Not on file    Gets together: Not on file    Attends religious service: Not on file    Active member of club or organization: Not on file    Attends meetings of clubs or organizations: Not on file    Relationship status: Not on file  . Intimate partner violence:    Fear of current or ex partner: Not on file    Emotionally abused: Not on file    Physically abused: Not on file    Forced sexual activity: Not on file  Other Topics Concern  . Not on file  Social History Narrative   Lives with [name] and [name].   [name] is [name].   DNR.    Past Surgical History:  Procedure Laterality Date  . bladder tack  11/1995  . BREAST BIOPSY  05/20/2000   bilaterally  . BREAST CYST ASPIRATION  12/1998   bilaterally  . CARDIAC CATHETERIZATION  2004  . CATARACT EXTRACTION W/ INTRAOCULAR LENS  IMPLANT, BILATERAL    . FRACTURE SURGERY  02/09/2006   bilateral elbows  . FRACTURE SURGERY     right knee  . FRACTURE SURGERY     tib plateau  . KNEE ARTHROSCOPY  12/12/2002   right   . LAPAROSCOPIC GASTRIC BANDING  2008  . SEPTOPLASTY  08/1998   with antral window   . TONSILLECTOMY AND ADENOIDECTOMY  1970  . TUBAL LIGATION  1970's    Family History  Problem Relation Age of Onset  . Asthma Mother   . COPD Father   . Heart disease Father   . Diabetes Sister   . Vision loss Sister        legally blind  . Breast cancer Neg Hx     Allergies  Allergen Reactions  . Codeine Sulfate     REACTION: hallucinations  . Penicillins Anaphylaxis, Swelling and Rash  . Wellbutrin [Bupropion] Other (See Comments)    Seizures and hallucinations    Current Outpatient Medications on File Prior to Visit  Medication Sig Dispense Refill  . acetaminophen (TYLENOL) 325 MG tablet Take 650  mg by mouth every 6 (six) hours as needed.    Marland Kitchen aspirin 81 MG tablet Take 81 mg by mouth daily.     Marland Kitchen Bioflavonoid Products (GRAPE SEED PO) Take 1 tablet by mouth daily.     . Calcium Carbonate-Vitamin D (CALCIUM + D PO) Take 600 mg by mouth daily.    Marland Kitchen  carbidopa-levodopa (SINEMET IR) 25-100 MG tablet TAKE 3 TABLETS BY MOUTH 3 TIMES A DAY 810 tablet 1  . cetirizine (ZYRTEC) 10 MG tablet Take 10 mg by mouth daily.    . Cholecalciferol (VITAMIN D3) 1000 UNITS CAPS Take 1 capsule by mouth daily.      . clonazePAM (KLONOPIN) 0.5 MG tablet AKE 1/2 TABLET BY MOUTH AT BEDTIME AND 1/2 DAILY AS NEEDED 30 tablet 5  . denosumab (PROLIA) 60 MG/ML SOLN injection Inject 60 mg into the skin every 6 (six) months. Administer in upper arm, thigh, or abdomen    . escitalopram (LEXAPRO) 10 MG tablet TAKE 1 TABLET BY MOUTH EVERY DAY 90 tablet 1  . feeding supplement (ENSURE COMPLETE) LIQD Take 237 mLs by mouth 2 (two) times daily between meals. (Patient taking differently: Take 237 mLs by mouth once. ) 1 Bottle 11  . gabapentin (NEURONTIN) 100 MG capsule TAKE 1 CAPSULE BY MOUTH THREE TIMES A DAY 270 capsule 1  . losartan-hydrochlorothiazide (HYZAAR) 50-12.5 MG tablet TAKE 1 TABLET BY MOUTH EVERY DAY 90 tablet 3  . mirtazapine (REMERON) 30 MG tablet TAKE 1 TABLET (30 MG TOTAL) BY MOUTH AT BEDTIME. 90 tablet 3  . Multiple Vitamins-Minerals (CENTRUM SILVER ADULT 50+) TABS Take by mouth.    . ranitidine (ZANTAC) 150 MG tablet Take 150 mg by mouth daily as needed for heartburn.    . simvastatin (ZOCOR) 20 MG tablet TAKE 1 TABLET (20 MG TOTAL) BY MOUTH AT BEDTIME. 90 tablet 3  . vitamin B-12 (CYANOCOBALAMIN) 1000 MCG tablet Take 1,000 mcg by mouth daily.       No current facility-administered medications on file prior to visit.     BP 126/76   Pulse 74   Temp 97.8 F (36.6 C) (Oral)   Ht 5\' 2"  (1.575 m)   Wt 157 lb 4 oz (71.3 kg)   SpO2 95%   BMI 28.76 kg/m    Objective:   Physical Exam  Constitutional: She  appears well-nourished.  Neck: Neck supple.  Cardiovascular: Normal rate and regular rhythm.  Respiratory: Effort normal and breath sounds normal.  GI: Soft. There is no tenderness. There is no CVA tenderness.  Skin: Skin is warm and dry.  Psychiatric: She has a normal mood and affect.           Assessment & Plan:  Dysuria:  Also with foul smelling urine x 1-2 weeks. UA today with 1+ leuks, positive nitrites, negative blood. Culture sent.  Has been on AZO with last dose last night. Given symptoms and history will treat. Rx for Bactrim course sent to pharmacy. Also await culture results.  Pleas Koch, NP

## 2018-04-03 NOTE — Assessment & Plan Note (Signed)
No seizure since removing Wellbutrin from regimen several years ago.

## 2018-04-03 NOTE — Assessment & Plan Note (Signed)
Compliant to semi-annual injections. Continue same.

## 2018-04-03 NOTE — Assessment & Plan Note (Signed)
Compliant to Lexapro daily, also taking Clonazepam BID. Strongly advised against daily benzo use and notified both her and daughter that I do not support daily use. patient is already at risk for falls given parkinson's disease so an added benzodiazepine increases her risk. Discussed for her to start weaning down and only take if needed.  She did make note today that her neurologist initially prescribed Clonazepam so I will defer future refills to her neurologist. She verbalized understanding.

## 2018-04-03 NOTE — Assessment & Plan Note (Signed)
Recent A1C stable, continue to monitor.

## 2018-04-05 LAB — URINE CULTURE
MICRO NUMBER:: 91075469
SPECIMEN QUALITY:: ADEQUATE

## 2018-04-18 ENCOUNTER — Other Ambulatory Visit: Payer: Self-pay | Admitting: Family Medicine

## 2018-04-18 ENCOUNTER — Other Ambulatory Visit: Payer: Self-pay | Admitting: Primary Care

## 2018-04-18 DIAGNOSIS — Z1231 Encounter for screening mammogram for malignant neoplasm of breast: Secondary | ICD-10-CM

## 2018-04-22 ENCOUNTER — Other Ambulatory Visit: Payer: Self-pay | Admitting: Family Medicine

## 2018-04-22 DIAGNOSIS — E782 Mixed hyperlipidemia: Secondary | ICD-10-CM

## 2018-04-24 ENCOUNTER — Other Ambulatory Visit: Payer: Self-pay | Admitting: Neurology

## 2018-04-24 NOTE — Telephone Encounter (Signed)
Please see rx refill request  °

## 2018-04-24 NOTE — Telephone Encounter (Signed)
Noted, refill sent to pharmacy. 

## 2018-05-16 ENCOUNTER — Ambulatory Visit
Admission: RE | Admit: 2018-05-16 | Discharge: 2018-05-16 | Disposition: A | Discharge status: Home / Self Care | Payer: Medicare Other | Source: Ambulatory Visit | Attending: Primary Care | Admitting: Primary Care

## 2018-05-16 DIAGNOSIS — Z1231 Encounter for screening mammogram for malignant neoplasm of breast: Secondary | ICD-10-CM | POA: Diagnosis not present

## 2018-06-11 ENCOUNTER — Other Ambulatory Visit: Payer: Self-pay | Admitting: Neurology

## 2018-06-30 ENCOUNTER — Other Ambulatory Visit: Payer: Self-pay | Admitting: Family Medicine

## 2018-07-01 NOTE — Telephone Encounter (Signed)
Ok to fill 

## 2018-07-02 NOTE — Telephone Encounter (Signed)
I will not be refilling, refills will need to come from neurologist or we will need to start weaning off.

## 2018-07-06 ENCOUNTER — Other Ambulatory Visit: Payer: Self-pay | Admitting: Neurology

## 2018-07-06 ENCOUNTER — Other Ambulatory Visit: Payer: Self-pay | Admitting: Family Medicine

## 2018-07-06 MED ORDER — CLONAZEPAM 0.5 MG PO TABS: ORAL_TABLET | ORAL | 0 refills | Status: DC

## 2018-07-06 NOTE — Telephone Encounter (Signed)
Patient on klonopin takes 0.25 mg bid (morning for anxiety). This was being written by PCP, see last note from new PCP, she states she will defer refills of this to you. Please advise.

## 2018-07-06 NOTE — Telephone Encounter (Signed)
Reviewed PMP Aware.  PCP has been prescribing this since April.  Not sure why they want to "defer" to me now but okay to fill .25 mg bid for now.

## 2018-07-06 NOTE — Telephone Encounter (Signed)
KC-Plz see refill req/thx dmf 

## 2018-07-06 NOTE — Telephone Encounter (Signed)
Patient called and is needing to let Dr. Carles Collet know that the medication Clonazepam that Dr. Carles Collet originally had called in for her,  now the PA that  she is seeing through [facility] does not feel comfortable writing the prescription for her. Please Advise. Thank you

## 2018-07-06 NOTE — Telephone Encounter (Signed)
LMOM letting them know that we would send RX to pharmacy.   Dr. Carles Collet please sign RX.

## 2018-08-04 ENCOUNTER — Ambulatory Visit (INDEPENDENT_AMBULATORY_CARE_PROVIDER_SITE_OTHER): Payer: Medicare Other | Admitting: Primary Care

## 2018-08-04 ENCOUNTER — Encounter: Payer: Self-pay | Admitting: Primary Care

## 2018-08-04 VITALS — BP 126/82 | HR 60 | Temp 98.0°F | Ht 62.0 in | Wt 159.5 lb

## 2018-08-04 DIAGNOSIS — J069 Acute upper respiratory infection, unspecified: Secondary | ICD-10-CM | POA: Diagnosis not present

## 2018-08-04 DIAGNOSIS — R300 Dysuria: Secondary | ICD-10-CM | POA: Diagnosis not present

## 2018-08-04 LAB — POC URINALSYSI DIPSTICK (AUTOMATED)
Bilirubin, UA: NEGATIVE
Blood, UA: NEGATIVE
Glucose, UA: NEGATIVE
Nitrite, UA: NEGATIVE
Protein, UA: POSITIVE — AB
Spec Grav, UA: 1.025 (ref 1.010–1.025)
Urobilinogen, UA: 0.2 E.U./dL
pH, UA: 6 (ref 5.0–8.0)

## 2018-08-04 NOTE — Patient Instructions (Signed)
Make sure to drink plenty of water.  You can take the AZO pills as needed.  I will be in touch with the urine culture results once received.  You can try plain Delsym or Robitussin as needed for cough.  Please notify me if you develop persistent fevers of 101, notice increased fatigue or weakness, and/or feel worse after 1 week of onset of symptoms.   It was a pleasure to see you today!

## 2018-08-04 NOTE — Progress Notes (Signed)
Subjective:    Patient ID: [name], female    DOB: 1945/10/29, 73 y.o.   MRN: [id number]  HPI  Ms. Giacomo is a 73 year old female with a history of GERD, hypertension, type 2 diabetes who presents today with a chief complaint of sore throat.  She also reports headache, nasal congestion, dysuria, urinary frequency. Respiratory symptoms and dysuria began three days ago. Today she's feeling better regarding her respiratory symptoms.   Her daughter is with her today who reports that she's "smelled" her mother for the last two weeks. The patient denies fevers, hematuria, pelvic pain, flank pain. She's been taking Coricidin recently, takes AZO pills daily.    Review of Systems  Constitutional: Negative for fever.  HENT: Positive for sore throat.   Respiratory: Negative for cough.   Gastrointestinal: Negative for abdominal pain.  Genitourinary: Positive for dysuria and frequency. Negative for hematuria and vaginal discharge.       Past Medical History:  Diagnosis Date  . Adjustment disorder with depressed mood   . Anxiety   . Bell's palsy    1985  . Dementia   . Depressive disorder, not elsewhere classified   . 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 DVT (deep venous thrombosis), left "early 2000's"   RLE  . Dysmetabolic syndrome X   . Essential hypertension, benign   . DSKAJGOT(157.2) 05/2011   "pretty often since they put me on Parkinson's medicine"  . History of shingles    "as a teen and in my 25's"  . Mixed hyperlipidemia   . Morbid obesity ([place])   . Neurodegenerative gait disorder   . Parkinson disease [facility])    "they think that's what it is"  . Personal history of thrombophlebitis   . Syncope and collapse 12/06/11   "this am; I pass out fairly often"  . Type II or unspecified type diabetes mellitus without mention of complication, not stated as uncontrolled 12/06/11   "not anymore; had lap band OR"     Social History    Socioeconomic History  . Marital status: Widowed    Spouse name: Not on file  . Number of children: 2  . Years of education: Not on file  . Highest education level: Not on file  Occupational History  . 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 Financial resource strain: Not on file  . Food insecurity:    Worry: Not on file    Inability: Not on file  . Transportation needs:    Medical: Not on file    Non-medical: Not on file  Tobacco Use  . Smoking status: Never Smoker  . Smokeless tobacco: Never Used  Substance and Sexual Activity  . Alcohol use: No    Comment: none  . Drug use: No  . Sexual activity: Not on file  Lifestyle  . Physical activity:    Days per week: Not on file    Minutes per session: Not on file  . Stress: Not on file  Relationships  . Social connections:    Talks on phone: Not on file    Gets together: Not on file    Attends religious service: Not on file    Active member of club or organization: Not on file    Attends meetings of clubs or organizations: Not on file    Relationship status: Not on  file  . Intimate partner violence:    Fear of current or ex partner: Not on file    Emotionally abused: Not on file    Physically abused: Not on file    Forced sexual activity: Not on file  Other Topics Concern  . Not on file  Social History Narrative   Lives with daughter and granddaughter.   Daughter is HPOA.   DNR.    Past Surgical History:  Procedure Laterality Date  . bladder tack  11/1995  . BREAST BIOPSY  05/20/2000   bilaterally  . BREAST CYST ASPIRATION  12/1998   bilaterally  . CARDIAC CATHETERIZATION  2004  . CATARACT EXTRACTION W/ INTRAOCULAR LENS  IMPLANT, BILATERAL    . FRACTURE SURGERY  02/09/2006   bilateral elbows  . FRACTURE SURGERY     right knee  . FRACTURE SURGERY     tib plateau  . KNEE ARTHROSCOPY  12/12/2002   right   . LAPAROSCOPIC GASTRIC BANDING  2008  .  SEPTOPLASTY  08/1998   with antral window   . TONSILLECTOMY AND ADENOIDECTOMY  1970  . TUBAL LIGATION  1970's    Family History  Problem Relation Age of Onset  . Asthma Mother   . COPD Father   . Heart disease Father   . Diabetes Sister   . Vision loss Sister        legally blind  . Breast cancer Neg Hx     Allergies  Allergen Reactions  . Codeine Sulfate     REACTION: hallucinations  . Penicillins Anaphylaxis, Swelling and Rash  . Wellbutrin [Bupropion] Other (See Comments)    Seizures and hallucinations    Current Outpatient Medications on File Prior to Visit  Medication Sig Dispense Refill  . acetaminophen (TYLENOL) 325 MG tablet Take 650 mg by mouth every 6 (six) hours as needed.    Marland Kitchen aspirin 81 MG tablet Take 81 mg by mouth daily.     Marland Kitchen Bioflavonoid Products (GRAPE SEED PO) Take 1 tablet by mouth daily.     . Calcium Carbonate-Vitamin D (CALCIUM + D PO) Take 600 mg by mouth daily.    . carbidopa-levodopa (SINEMET IR) 25-100 MG tablet TAKE 3 TABLETS BY MOUTH 3 TIMES A DAY 810 tablet 1  . cetirizine (ZYRTEC) 10 MG tablet Take 10 mg by mouth daily.    . Cholecalciferol (VITAMIN D3) 1000 UNITS CAPS Take 1 capsule by mouth daily.      . clonazePAM (KLONOPIN) 0.5 MG tablet TAKE 1/2 TABLET BY MOUTH AT BEDTIME AND 1/2 DAILY AS NEEDED 90 tablet 0  . denosumab (PROLIA) 60 MG/ML SOLN injection Inject 60 mg into the skin every 6 (six) months. Administer in upper arm, thigh, or abdomen    . escitalopram (LEXAPRO) 10 MG tablet TAKE 1 TABLET BY MOUTH EVERY DAY 90 tablet 0  . feeding supplement (ENSURE COMPLETE) LIQD Take 237 mLs by mouth 2 (two) times daily between meals. (Patient taking differently: Take 237 mLs by mouth once. ) 1 Bottle 11  . gabapentin (NEURONTIN) 100 MG capsule TAKE 1 CAPSULE BY MOUTH THREE TIMES A DAY 270 capsule 1  . losartan-hydrochlorothiazide (HYZAAR) 50-12.5 MG tablet TAKE 1 TABLET BY MOUTH EVERY DAY 90 tablet 3  . mirtazapine (REMERON) 30 MG tablet TAKE 1  TABLET (30 MG TOTAL) BY MOUTH AT BEDTIME. 90 tablet 3  . Multiple Vitamins-Minerals (CENTRUM SILVER ADULT 50+) TABS Take by mouth.    . ranitidine (ZANTAC) 150 MG tablet Take 150  mg by mouth daily as needed for heartburn.    . simvastatin (ZOCOR) 20 MG tablet Take 1 tablet by mouth at bedtime for cholesterol. 90 tablet 2  . vitamin B-12 (CYANOCOBALAMIN) 1000 MCG tablet Take 1,000 mcg by mouth daily.       No current facility-administered medications on file prior to visit.     BP 126/82   Pulse 60   Temp 98 F (36.7 C) (Oral)   Ht 5\' 2"  (1.575 m)   Wt 159 lb 8 oz (72.3 kg)   SpO2 95%   BMI 29.17 kg/m    Objective:   Physical Exam  Constitutional: She is oriented to person, place, and time. She appears well-nourished. She does not appear ill.  HENT:  Right Ear: Tympanic membrane and ear canal normal.  Left Ear: Tympanic membrane and ear canal normal.  Nose: No mucosal edema. Right sinus exhibits no maxillary sinus tenderness and no frontal sinus tenderness. Left sinus exhibits no maxillary sinus tenderness and no frontal sinus tenderness.  Mouth/Throat: Oropharynx is clear and moist.  Neck: Neck supple.  Cardiovascular: Normal rate and regular rhythm.  Respiratory: Effort normal and breath sounds normal. She has no wheezes.  GI: Soft. There is no abdominal tenderness. There is no CVA tenderness.  Neurological: She is alert and oriented to person, place, and time.  Skin: Skin is warm and dry.           Assessment & Plan:  URI:  Sore throat, congestion, cough x 3 days, overall feeling better. Exam today with clear lungs, appears well. Suspect viral vs allergy involvement. Discussed use of Delsym/Robitussin PRN. Return precautions provided.  Dysuria:  Present with urinary frequency x 3 days. Exam unremarkable. UA today with 1+ leuks, otherwise unremarkable for infection. Culture sent. Discussed to push intake of water. She appears stable. Will await culture  results.  Pleas Koch, NP

## 2018-08-06 ENCOUNTER — Other Ambulatory Visit: Payer: Self-pay | Admitting: Primary Care

## 2018-08-06 DIAGNOSIS — N3000 Acute cystitis without hematuria: Secondary | ICD-10-CM

## 2018-08-06 LAB — URINE CULTURE
MICRO NUMBER:: 39482
SPECIMEN QUALITY:: ADEQUATE

## 2018-08-06 MED ORDER — SULFAMETHOXAZOLE-TRIMETHOPRIM 800-160 MG PO TABS: 1.0000 | ORAL_TABLET | Freq: Two times a day (BID) | ORAL | 0 refills | Status: DC

## 2018-08-08 NOTE — Progress Notes (Signed)
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  This patient is accompanied in the office by her child who supplements the history.      She is on carbidopa/levodopa 25/100, 3 tablets 3 times per day.  She is doing well on this medication, without side effects.  She had one fall the day before Thanksgiving.  She was in a parking lot of a restaurant and tripped over something(curb).  She fx her nose and had to have her lip stitched up.    Since then, however, she has done very well.  Her daughter reports that she has been very stable on her feet.  Last visit, she was complaining of a decreased appetite and increased anxiety.  Therefore, I increased her Remeron to 30 mg at night.  This helped with both sleep and appetite and she is very pleased.  She also has a hx of seizure, likely secondary to Wellbutrin.  Last visit, her keppra was d/c.  She has been seizure free since that time.  However, mood and depression continue to be an issue.  I sent her to Dr. Cheryln Manly but she didn't follow up.  She states that she cannot talk to him without her daughter present.  Her biggest issue is that her granddaughter is a Ship broker and she lives with the patient.  Apparently, the majority of the house is clean and safe with the exception of the granddaughters room.  The patient states that the mess just drives her crazy and she cannot stop thinking about it.  Her granddaughter goes to community college and her leaving the house is not an option.  04/03/13 update:  [name].  She was supposed to f/u with Dr Cheryln Manly due to significant anxiety and depression and she cancelled her 3/4, 3/13 and 3/27 visits and never r/s.  She has not seen him since 10/13.  She states that she is just "not going to talk with someone."  She, however, is doing some better in that regard.  Much of her stress surrounds her granddaughter, who lives with  her and is a Ship broker.  The patient states that her granddaughter has now admitted to this, but to seek help.  They were able to get some of the room cleaned up a while ago and that seemed to really help the patient's mental state.  However, the room has reconnected and is now just as it previously was.  She also reports a significant amount of stress over her sister who has end-stage Alzheimer's dementia.  Her sister no longer recognizes her, and the patient realizes when she goes around her sister, it makes her symptoms much worse.  She feels that her memory is getting worse, but nothing like her sisters.  She also thinks that her speech is getting worse.    She is having some headaches, similar to what she had in the past, prior to starting on the Levodopa.  It is over the L forehead, but not in the eye.  It is pounding.  It is daily.  It does not cause n/v or cause photophobia or phonophobia.  It makes her feel "drained."  It does not prevent her from doing things.  There are no lateralizing paresthesias with the headaches.  There are no vision changes.  07/30/13 update:  The pt is on carbidopa/levodopa 25/100 three tablets three times per day.  She is doing well in that  regard.  She did not go to PT because of transportation issues.  She was started on zanaflex last visit for HA and was also started on the exelon patch.  Interestingly, she states that the exelon patch caused headaches but it was started after the headache.  Nonetheless, she stopped it and still has headache.  It starts in the L forehead and radiates over the L side of the head.  She still thinks that the patch made them worse however and also thinks that it caused UTI's.  She is still on zanaflex but isn't sure its helping.  No falls/hallucination/syncope/lightheadedness.  11/27/13 update:  Patient is following up today in the movement disorder clinic.  She is currently on carbidopa/levodopa 25/100, 3 tablets by mouth 3 times per day.  Since our  last visit, I did get a note from Dr. Domingo Cocking at the headache clinic.  He started the patient on Indocin 25 mg 3 times a day and she was told to titrate to 75 mg 3 times a day.  She states that she had SE with that and she was changed to gabapentin 100 mg and that is helping.  He also scheduled her for trigger point injections and an MRI brain.  She did not care for the trigger point injections although they did help.  She has f/u in June.  Walking seems to have been a little slower, but no falls.  Her daughter is with her today and feels that walking has been more tenuous.   No hallucinations.  No diplopia but is having blurry vision.  Does have vision appt in [facility] in few weeks.  05/07/14 update:   Patient is following up today in the movement disorder clinic.  She is currently on carbidopa/levodopa 25/100, 3 tablets by mouth 3 times per day.  Daughter states that she often forgets middle of the day dose but pt denies that.  She takes it at 9-10 am on awakening, 3 pm and bedtime.  She and her daughter both think that it is helpful.   No diplopia.  No falls.  She saw Dr. Domingo Cocking in June and I got a note from him.  She had more trigger point injections.  She is doing really well from a headache standpoint.  She doesn't have to return to him for 6 months (saw him in September).  She continues on remeron for her depression and mood.  She states that she hates that she is confined to the home and depression is tied into that.  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  She then asks if her daughter can come back to the room for the discussion and she did.   She did do therapy with [facility] and she thought that it went well.   10/08/14 update:   Patient is following up today in the movement disorder clinic. She is accompanied by her daughter who supplements the hx.  She is currently on carbidopa/levodopa 25/100, 3 tablets  by mouth 3 times per day.  C/o memory loss.  States that her daughter will tell her to go to the kitchen to get things and she will get there and not be able to remember.  Her daughter thinks that she jumps subjects.  Also c/o feet paresthesias.  Worries that DM has "returned" as used to be diabetic but not anymore.  Last A1C in April was 6.0.  Also  states that right leg below the knee posteriorally she is having paresthesias.  Started after she bent over one day and the knee "popped."  No LBP.  On gabapentin but that is for headache, which is well controlled.  She has been headache free for a long time.  Received a note from Dr. Domingo Cocking recently and pt told to continue meds.    04/10/15 update:  The patient is following up today.  She is on carbidopa/levodopa 25/100, 3 tablets 3 times per day.  Because I was not convinced that the patient had a dementia but her previous neuropsych test years ago said that she did and because some of the patient's depression surrounds the fact that her daughter will not let her drive because of this potential diagnosis, we repeated neuropsych testing.  While there was evidence of cognitive change due to depression, she did not meet the criteria for the diagnosis of dementia.  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  It was recommended that she undergo psychiatric evaluation and treatment, but she refused.  She was again given a brochure for the driving evaluator in [place] to have a driving evaluation.  She reports that her depression was fairly well under control until the end of 2022/12/14 when her sister died and she has been appropriately grieving.  She did have an EMG done since last visit.  This was done on 11/07/2014 There was no evidence of a large fiber peripheral neuropathy, but there was evidence of a chronic, moderate right L2-L4 radiculopathy and a chronic, mild L5 radiculopathy on the right.  She fell one time getting out  of the chair as the right leg gave out.  She also rolled out of the bed at night one time.   She states that she was dreaming.   Fortunately, she did not get hurt with either of these.    10/20/15 update:  The patient follows up today, accompanied by her daughter who supplements the history.  She has a history of vascular parkinsonism and is currently on carbidopa/levodopa 25/100, 3 tablets 3 times per day.  Reports that she is a little "fidgety" and that she just can't stay still.  No new medications.  She also has a history of depression and is prescribed Remeron by her primary care physician.  Her depression mostly surrounds the fact that she cannot leave the house because her daughter will not let her drive.  Depression is improved as she has gotten some cats that keep her company.  We have talked previously about a driving evaluation, as I think she likely could drive from a neurologic standpoint.  Her daughter does not want her to pursue that.  No falls.   She has been headache free on gabapentin and doesn't want to change that  04/22/16 update:  The patient follows up today, accompanied by her daughter supplements the history.  She is on carbidopa/levodopa 25/100, 3 tablets 3 times per day.  She has had 2 falls.  With the first, she got up and made her bed, which she doesn't even remember but she does remember hitting the ground.  With the second one, her toilet was leaking and she fell over the wet towels on the floor and she hit her L head.  She went to the ER for this one, which was 8/29.  She has had a sore spot on the L occiput since. Was given tramadol but made her too sleepy. She had  a CT brain that day and I reviewed it.  It was nonacute and only WMD and mild atrophy.   She denies lightheadedness or near syncope.  She is on Remeron for her depression and feels that she has been stable in that regard.  Reports that "I talk to my cats while my family is working."   She has had no hallucinations.  She  is not particularly active with cardiovascular exercise.  Headaches well controlled on gabapentin 100 mg tid except sore occiput where she fell.  She also uses that for lumbar radiculopathy but reports that is better.    08/23/16 update:  The patient returns today, accompanied by her daughter who supplements the history.  She remains on carbidopa/levodopa 25/100, 3 tablets 3 times per day.  Overall, she feels that she has been a little shaky. Pt states that she fell one time; she was cleaning up a toilet that overflowed and tripped over towels (she told me about this one last time).  With another instance, she woke up and was totally dressed and making bed and had fallen.  She was awake when daughter went in but she doesn't remember if she was sleep walking or not.  Pt denies lightheadedness, near syncope.  No hallucinations.  Mood has been stable on remeron.  No SI/HI.  Has a chronic lumbar radiculopathy and hx of headache, well controlled on gabapentin 100 mg tid.  She has about 1 headache a week and lumbar radiculopathy is resolved.  She thinks that her short term memory hasn't been as good.  She will go into the kitchen and forget why she is there.  Long term memory is good.    05/09/17 update:  Patient seen today in follow-up.  I have not seen the patient for about 9 months.  Reports that she was started on prolia since our last visit.  She is accompanied by her daughter who supplements the history.  Patient is on carbidopa/levodopa 25/100, 3 tablets 3 times per day.  She fell out of the bed while sleeping.  Her daughter isn't convinced she was sleeping because her bed was "made up."  Patient insists she was sleeping because someone was chasing her.  She states that she is hallucinating because she is hearing "engines and music."  She is stumbling more but not falling during the day.  C/o continued short term memory loss.    09/09/17 update: The patient is seen today in follow-up.  She is accompanied by her  daughter who supplements history.  She remains on carbidopa/levodopa 25/100, 3 tablets 3 times per day.  She saw Dr. Si Raider on August 11, 2017 and f/u with her 09/08/17 for results of repeat neurocognitive testing.  This demonstrated similar results as the prior times she has had this.  There is no evidence of dementia.  There is evidence of significant depression and GAD.  last visit, the patient was complaining about REM behavior disorder.  I told her to discontinue the Xanax and we replaced that with a very small dose of clonazepam, 0.25 mg twice per day.  She reports that "I've gotten used to it and now I'm sleeping well."  She is no longer dreaming/acting out dreams.  No falls since our last visit.    She has L trap pain.  02/07/18 update: Patient is seen today in follow-up for vascular parkinsonism.  She is accompanied by her daughter who supplements history.  Patient is on carbidopa/levodopa 25/100, 3 tablets 3 times per day.  She has had no falls since last visit.   Daughter thinks that she has been more stable since our last visit.  [place]   No lightheadedness or near syncope.  Prozac was stopped last visit.  She was started on Lexapro.  She reports that this was helpful.  Mood is good and she thinks that lexapro helps RBD.  She is also on a small dose of clonazepam, 0.25 mg twice per day.  She reports that this is helping anxiety and sleep.  Records have been reviewed since our last visit.  Patient saw her primary care physician on Dec 12, 2017.    08/10/18 update: Patient is seen today in follow-up for vascular parkinsonism, accompanied by her daughter who supplements history.  Patient is on carbidopa/levodopa 25/100, 3 tablets 3 times per day.  No falls since last visit.  No lightheadedness or near syncope.  Mood is good on lexapro.  She uses klonopin, 0.25 mg bid for a combination of anxiety as well as sleep.  That has helped.  Records are reviewed since last visit.  She was  recently seen by her nurse practitioner at primary care for dysuria.  After urine culture, Bactrim DS was called in for the patient.  She is feeling better.  She states that headaches have been returning.  In bilateral frontal region.  On gabapentin 100 mg tid. Comes and goes in the day.  Thinks weather may play a role.  Daughter thinks that mostly related to sinus issues.    PREVIOUS MEDICATIONS: Sinemet; wellbutrin (seizure); prozac (lost efficacy)  ALLERGIES:   Allergies  Allergen Reactions  . Codeine Sulfate     REACTION: hallucinations  . Penicillins Anaphylaxis, Swelling and Rash  . Wellbutrin [Bupropion] Other (See Comments)    Seizures and hallucinations    CURRENT MEDICATIONS:  Current Outpatient Medications on File Prior to Visit  Medication Sig Dispense Refill  . acetaminophen (TYLENOL) 325 MG tablet Take 650 mg by mouth every 6 (six) hours as needed.    Marland Kitchen aspirin 81 MG tablet Take 81 mg by mouth daily.     Marland Kitchen Bioflavonoid Products (GRAPE SEED PO) Take 1 tablet by mouth daily.     . Calcium Carbonate-Vitamin D (CALCIUM + D PO) Take 600 mg by mouth daily.    . carbidopa-levodopa (SINEMET IR) 25-100 MG tablet TAKE 3 TABLETS BY MOUTH 3 TIMES A DAY 810 tablet 1  . cetirizine (ZYRTEC) 10 MG tablet Take 10 mg by mouth daily.    . Cholecalciferol (VITAMIN D3) 1000 UNITS CAPS Take 1 capsule by mouth daily.      . clonazePAM (KLONOPIN) 0.5 MG tablet TAKE 1/2 TABLET BY MOUTH AT BEDTIME AND 1/2 DAILY AS NEEDED 90 tablet 0  . denosumab (PROLIA) 60 MG/ML SOLN injection Inject 60 mg into the skin every 6 (six) months. Administer in upper arm, thigh, or abdomen    . escitalopram (LEXAPRO) 10 MG tablet TAKE 1 TABLET BY MOUTH EVERY DAY 90 tablet 0  . feeding supplement (ENSURE COMPLETE) LIQD Take 237 mLs by mouth 2 (two) times daily between meals. (Patient taking differently: Take 237 mLs by mouth once. ) 1 Bottle 11  . gabapentin (NEURONTIN) 100 MG capsule TAKE 1 CAPSULE BY MOUTH THREE TIMES A  DAY 270 capsule 1  . losartan-hydrochlorothiazide (HYZAAR) 50-12.5 MG tablet TAKE 1 TABLET BY MOUTH EVERY DAY 90 tablet 3  . mirtazapine (REMERON) 30 MG tablet TAKE 1 TABLET (30 MG TOTAL) BY MOUTH AT  BEDTIME. 90 tablet 3  . Multiple Vitamins-Minerals (CENTRUM SILVER ADULT 50+) TABS Take by mouth.    . ranitidine (ZANTAC) 150 MG tablet Take 150 mg by mouth daily as needed for heartburn.    . simvastatin (ZOCOR) 20 MG tablet Take 1 tablet by mouth at bedtime for cholesterol. 90 tablet 2  . sulfamethoxazole-trimethoprim (BACTRIM DS,SEPTRA DS) 800-160 MG tablet Take 1 tablet by mouth 2 (two) times daily. For urinary tract infection. 10 tablet 0  . vitamin B-12 (CYANOCOBALAMIN) 1000 MCG tablet Take 1,000 mcg by mouth daily.       No current facility-administered medications on file prior to visit.     PAST MEDICAL HISTORY:   Past Medical History:  Diagnosis Date  . Adjustment disorder with depressed mood   . Anxiety   . Bell's palsy    1985  . Dementia   . Depressive disorder, not elsewhere classified   . 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 DVT (deep venous thrombosis), left "early 2000's"   RLE  . Dysmetabolic syndrome X   . Essential hypertension, benign   . HERDEYCX(448.1) 05/2011   "pretty often since they put me on Parkinson's medicine"  . History of shingles    "as a teen and in my 41's"  . Mixed hyperlipidemia   . Morbid obesity ([place])   . Neurodegenerative gait disorder   . Parkinson disease [facility])    "they think that's what it is"  . Personal history of thrombophlebitis   . Syncope and collapse 12/06/11   "this am; I pass out fairly often"  . Type II or unspecified type diabetes mellitus without mention of complication, not stated as uncontrolled 12/06/11   "not anymore; had lap band OR"    PAST SURGICAL HISTORY:   Past Surgical History:  Procedure Laterality Date  . bladder tack  11/1995  . BREAST BIOPSY  05/20/2000   bilaterally  .  BREAST CYST ASPIRATION  12/1998   bilaterally  . CARDIAC CATHETERIZATION  2004  . CATARACT EXTRACTION W/ INTRAOCULAR LENS  IMPLANT, BILATERAL    . FRACTURE SURGERY  02/09/2006   bilateral elbows  . FRACTURE SURGERY     right knee  . FRACTURE SURGERY     tib plateau  . KNEE ARTHROSCOPY  12/12/2002   right   . LAPAROSCOPIC GASTRIC BANDING  2008  . SEPTOPLASTY  08/1998   with antral window   . TONSILLECTOMY AND ADENOIDECTOMY  1970  . TUBAL LIGATION  1970's    SOCIAL HISTORY:   Social History   Socioeconomic History  . Marital status: Widowed    Spouse name: Not on file  . Number of children: 2  . Years of education: Not on file  . Highest education level: Not on file  Occupational History  . 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 Financial resource strain: Not on file  . Food insecurity:    Worry: Not on file    Inability: Not on file  . Transportation needs:    Medical: Not on file    Non-medical: Not on file  Tobacco Use  . Smoking status: Never Smoker  . Smokeless tobacco: Never Used  Substance and Sexual Activity  . Alcohol use: No    Comment: none  . Drug use: No  . Sexual activity: Not on file  Lifestyle  . Physical activity:  Days per week: Not on file    Minutes per session: Not on file  . Stress: Not on file  Relationships  . Social connections:    Talks on phone: Not on file    Gets together: Not on file    Attends religious service: Not on file    Active member of club or organization: Not on file    Attends meetings of clubs or organizations: Not on file    Relationship status: Not on file  . Intimate partner violence:    Fear of current or ex partner: Not on file    Emotionally abused: Not on file    Physically abused: Not on file    Forced sexual activity: Not on file  Other Topics Concern  . Not on file  Social History Narrative   Lives with daughter and granddaughter.    Daughter is HPOA.   DNR.    FAMILY HISTORY:   Family Status  Relation Name Status  . Mother  Deceased at age 54       natural causes  . Father  Deceased at age 56       MI  . Sister  Alive       DM-2  . Child  Alive       2, HTN, DM-2, hyperlipidemia  . Neg Hx  (Not Specified)    ROS:  Review of Systems  Constitutional: Negative.   HENT: Negative.   Eyes: Negative.   Respiratory: Negative.   Cardiovascular: Negative.   Gastrointestinal: Negative.   Genitourinary: Negative.   Musculoskeletal: Negative.   Skin: Negative.   Endo/Heme/Allergies: Negative.     PHYSICAL EXAMINATION:    VITALS:   Vitals:   08/10/18 0751  BP: 140/90  Pulse: 74  SpO2: 92%  Weight: 157 lb 8 oz (71.4 kg)  Height: 5\' 2"  (1.575 m)    GEN:  The patient appears stated age and is in NAD. HEENT:  Normocephalic, atraumatic.  The mucous membranes are moist. The superficial temporal arteries are without ropiness or tenderness. CV:  RRR Lungs:  CTAB Neck/HEME:  There are no carotid bruits bilaterally.  Neurological examination:  Orientation: The patient is alert and oriented x3. Cranial nerves: There is good facial symmetry. The speech is fluent and clear. Soft palate rises symmetrically and there is no tongue deviation. Hearing is intact to conversational tone. Sensation: Sensation is intact to light touch throughout Motor: Strength is 5/5 in the bilateral upper and lower extremities.   Shoulder shrug is equal and symmetric.  There is no pronator drift.   Movement examination: Tone: There is normal tone in the UE/LE Abnormal movements:  No tremor is noted  Coordination:  There is mild decremation with finger and toe taps on the right Gait and Station: The patient pushes off of the chair.  She actually walks well down the hall today and is steady  LABS:  Lab Results  Component Value Date   WBC 8.3 12/06/2016   HGB 13.3 02/06/2017   HCT 39.0 02/06/2017   MCV 94.3 12/06/2016   PLT  244.0 12/06/2016     Chemistry      Component Value Date/Time   NA 143 12/07/2017 0844   K 4.0 12/07/2017 0844   CL 102 12/07/2017 0844   CO2 31 12/07/2017 0844   BUN 25 (H) 12/07/2017 0844   CREATININE 0.72 12/07/2017 0844      Component Value Date/Time   CALCIUM 10.6 (H) 12/07/2017 2536  ALKPHOS 60 12/07/2017 0844   AST 9 12/07/2017 0844   ALT 6 12/07/2017 0844   BILITOT 0.3 12/07/2017 0844     Lab Results  Component Value Date   TSH 1.04 12/07/2017   Lab Results  Component Value Date   VITAMINB12 1,387 (H) 11/16/2012    Lab Results  Component Value Date   HGBA1C 6.6 12/12/2017   Lab Results  Component Value Date   ESRSEDRATE 18 04/03/2013    ASSESSMENT/PLAN: 1.  Vascular Parkinsonism.    -she will remain on carbidopa/levodopa 25/100, 3 tablets 3 times per day.  Risks, benefits, side effects and alternative therapies were discussed.  The opportunity to ask questions was given and they were answered to the best of my ability.  The patient expressed understanding and willingness to follow the outlined treatment protocols. 2.  Remote hx of seizure.  This is likely secondary to the initiation of wellbutrin.  She should remain off of this medication.  EEG in the hospital was normal.  She has been seizure-free for several years. 3.  Depression.   -on remeron.  Managed by PCP.  - I think her depression is situational and related to not being able to drive.  They don't want to pursue driving eval.      -No SI/HI  -I encouraged her to exercise safely.    -better on lexapro than prozac 4.  Memory change  -is pseudodementia.  No evidence of true dementia 5.  RBD  -off xanax.  On klonopin and doing well.  Takes 0.25 mg bid (morning for anxiety) 6.  Headache  -slightly increase gabapentin to 100 mg - 2 in the AM, 1 in the afternoon and evening.  Will later try to back off once headaches under better control (perhaps next visit) 7. F/u 6 months.

## 2018-08-10 ENCOUNTER — Ambulatory Visit (INDEPENDENT_AMBULATORY_CARE_PROVIDER_SITE_OTHER): Payer: Medicare Other | Admitting: Neurology

## 2018-08-10 ENCOUNTER — Encounter: Payer: Self-pay | Admitting: Neurology

## 2018-08-10 VITALS — BP 140/90 | HR 74 | Ht 62.0 in | Wt 157.5 lb

## 2018-08-10 DIAGNOSIS — G214 Vascular parkinsonism: Secondary | ICD-10-CM | POA: Diagnosis not present

## 2018-08-10 DIAGNOSIS — R51 Headache: Secondary | ICD-10-CM

## 2018-08-10 DIAGNOSIS — R519 Headache, unspecified: Secondary | ICD-10-CM

## 2018-08-10 DIAGNOSIS — G4752 REM sleep behavior disorder: Secondary | ICD-10-CM | POA: Diagnosis not present

## 2018-08-10 NOTE — Patient Instructions (Signed)
increase gabapentin to 100 mg - 2 in the AM, 1 in the afternoon and evening.  Will later try to back off once headaches under better control (maybe next visit).  Call me if you need a new RX of that.

## 2018-09-07 ENCOUNTER — Telehealth: Payer: Self-pay

## 2018-09-07 NOTE — Telephone Encounter (Signed)
Please notify patient that I'd really like to examine her, please schedule her for a visit for tomorrow (Friday).

## 2018-09-07 NOTE — Telephone Encounter (Signed)
[name] (DPR signed) said pts granddaughter was dx with Type A flu on 09/06/18. Pt started prod cough with clear phlegm late afternoon on 09/06/18. Pt has not taken temp but having chills, pt is taking tylenol. Scratchy throat. No body aches. [name] wants to know if tamiflu could be sent to [facility] without pt making appt. [name] request cb.

## 2018-09-07 NOTE — Telephone Encounter (Signed)
Spoken and notified patient's daughter of Tawni Millers comments. Patient has been schedule to see Anda Kraft at 2 pm on 09/08/2018

## 2018-09-08 ENCOUNTER — Ambulatory Visit (INDEPENDENT_AMBULATORY_CARE_PROVIDER_SITE_OTHER): Payer: Medicare Other | Admitting: Primary Care

## 2018-09-08 ENCOUNTER — Encounter: Payer: Self-pay | Admitting: Primary Care

## 2018-09-08 VITALS — BP 130/72 | HR 83 | Temp 98.2°F | Ht 62.0 in | Wt 160.5 lb

## 2018-09-08 DIAGNOSIS — J069 Acute upper respiratory infection, unspecified: Secondary | ICD-10-CM

## 2018-09-08 DIAGNOSIS — B9789 Other viral agents as the cause of diseases classified elsewhere: Secondary | ICD-10-CM

## 2018-09-08 DIAGNOSIS — Z20828 Contact with and (suspected) exposure to other viral communicable diseases: Secondary | ICD-10-CM | POA: Diagnosis not present

## 2018-09-08 LAB — POC INFLUENZA A&B (BINAX/QUICKVUE)
Influenza A, POC: NEGATIVE
Influenza B, POC: NEGATIVE

## 2018-09-08 MED ORDER — BENZONATATE 200 MG PO CAPS: 200.0000 mg | ORAL_CAPSULE | Freq: Three times a day (TID) | ORAL | 0 refills | Status: DC | PRN

## 2018-09-08 MED ORDER — OSELTAMIVIR PHOSPHATE 75 MG PO CAPS: 75.0000 mg | ORAL_CAPSULE | Freq: Every day | ORAL | 0 refills | Status: DC

## 2018-09-08 NOTE — Progress Notes (Signed)
Subjective:    Patient ID: [name], female    DOB: 1946/03/30, 73 y.o.   MRN: [id number]  HPI  [name] is a 73 year old female with a history of dysphagia, GERD, diabetes who presents today with a chief complaint of cough.  [name] also reports right ear pain. [name] cough is productive with clear sputum. [name] has not been checking [name] temperature, does feel chilled. [name] [name] was exposed with influenza A two days, and [name] [name] lives with [name]. [name] developed [name] symptoms 2 days ago.   [name]'s been taking Delsym and Ibuprofen without much improvement.   Review of Systems  Constitutional: Positive for chills and fatigue. Negative for fever.  HENT: Positive for congestion.   Respiratory: Negative for shortness of breath.        Past Medical History:  Diagnosis Date  . Adjustment disorder with depressed mood   . Anxiety   . Bell's palsy    1985  . Dementia   . Depressive disorder, not elsewhere classified   . 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 DVT (deep venous thrombosis), left "early 2000's"   RLE  . Dysmetabolic syndrome X   . Essential hypertension, benign   . SJGGEZMO(294.7) 05/2011   "pretty often since they put me on Parkinson's medicine"  . History of shingles    "as a teen and in my 20's"  . Mixed hyperlipidemia   . Morbid obesity ([place])   . Neurodegenerative gait disorder   . Parkinson disease [facility])    "they think that's what it is"  . Personal history of thrombophlebitis   . Syncope and collapse 12/06/11   "this am; I pass out fairly often"  . Type II or unspecified type diabetes mellitus without mention of complication, not stated as uncontrolled 12/06/11   "not anymore; had lap band OR"     Social History   Socioeconomic History  . Marital status: Widowed    Spouse name: Not on file  . Number of children: 2  . Years of education: Not on file  . Highest education level: Not on file  Occupational  History  . 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 Financial resource strain: Not on file  . Food insecurity:    Worry: Not on file    Inability: Not on file  . Transportation needs:    Medical: Not on file    Non-medical: Not on file  Tobacco Use  . Smoking status: Never Smoker  . Smokeless tobacco: Never Used  Substance and Sexual Activity  . Alcohol use: No    Comment: none  . Drug use: No  . Sexual activity: Not on file  Lifestyle  . Physical activity:    Days per week: Not on file    Minutes per session: Not on file  . Stress: Not on file  Relationships  . Social connections:    Talks on phone: Not on file    Gets together: Not on file    Attends religious service: Not on file    Active member of club or organization: Not on file    Attends meetings of clubs or organizations: Not on file    Relationship status: Not on file  . Intimate partner violence:    Fear of current or ex partner: Not on file    Emotionally abused: Not  on file    Physically abused: Not on file    Forced sexual activity: Not on file  Other Topics Concern  . Not on file  Social History Narrative   Lives with daughter and [name].   Daughter is HPOA.   DNR.    Past Surgical History:  Procedure Laterality Date  . bladder tack  11/1995  . BREAST BIOPSY  05/20/2000   bilaterally  . BREAST CYST ASPIRATION  12/1998   bilaterally  . CARDIAC CATHETERIZATION  2004  . CATARACT EXTRACTION W/ INTRAOCULAR LENS  IMPLANT, BILATERAL    . FRACTURE SURGERY  02/09/2006   bilateral elbows  . FRACTURE SURGERY     right knee  . FRACTURE SURGERY     tib plateau  . KNEE ARTHROSCOPY  12/12/2002   right   . LAPAROSCOPIC GASTRIC BANDING  2008  . SEPTOPLASTY  08/1998   with antral window   . TONSILLECTOMY AND ADENOIDECTOMY  1970  . TUBAL LIGATION  1970's    Family History  Problem Relation Age of Onset  . Asthma Mother   . COPD Father    . Heart disease Father   . Diabetes Sister   . Vision loss Sister        legally blind  . Breast cancer Neg Hx     Allergies  Allergen Reactions  . Codeine Sulfate     REACTION: hallucinations  . Penicillins Anaphylaxis, Swelling and Rash  . Wellbutrin [Bupropion] Other (See Comments)    Seizures and hallucinations    Current Outpatient Medications on File Prior to Visit  Medication Sig Dispense Refill  . acetaminophen (TYLENOL) 325 MG tablet Take 650 mg by mouth every 6 (six) hours as needed.    Marland Kitchen aspirin 81 MG tablet Take 81 mg by mouth daily.     Marland Kitchen Bioflavonoid Products (GRAPE SEED PO) Take 1 tablet by mouth daily.     . calcium carbonate (TUMS EX) 750 MG chewable tablet Chew 1 tablet by mouth daily.    . Calcium Carbonate-Vitamin D (CALCIUM + D PO) Take 600 mg by mouth daily.    . carbidopa-levodopa (SINEMET IR) 25-100 MG tablet TAKE 3 TABLETS BY MOUTH 3 TIMES A DAY 810 tablet 1  . cetirizine (ZYRTEC) 10 MG tablet Take 10 mg by mouth daily.    . Cholecalciferol (VITAMIN D3) 1000 UNITS CAPS Take 1 capsule by mouth daily.      . clonazePAM (KLONOPIN) 0.5 MG tablet TAKE 1/2 TABLET BY MOUTH AT BEDTIME AND 1/2 DAILY AS NEEDED 90 tablet 0  . denosumab (PROLIA) 60 MG/ML SOLN injection Inject 60 mg into the skin every 6 (six) months. Administer in upper arm, thigh, or abdomen    . escitalopram (LEXAPRO) 10 MG tablet TAKE 1 TABLET BY MOUTH EVERY DAY 90 tablet 0  . feeding supplement (ENSURE COMPLETE) LIQD Take 237 mLs by mouth 2 (two) times daily between meals. (Patient taking differently: Take 237 mLs by mouth once. ) 1 Bottle 11  . gabapentin (NEURONTIN) 100 MG capsule TAKE 1 CAPSULE BY MOUTH THREE TIMES A DAY 270 capsule 1  . losartan-hydrochlorothiazide (HYZAAR) 50-12.5 MG tablet TAKE 1 TABLET BY MOUTH EVERY DAY 90 tablet 3  . mirtazapine (REMERON) 30 MG tablet TAKE 1 TABLET (30 MG TOTAL) BY MOUTH AT BEDTIME. 90 tablet 3  . Multiple Vitamins-Minerals (CENTRUM SILVER ADULT 50+) TABS  Take by mouth.    . simvastatin (ZOCOR) 20 MG tablet Take 1 tablet by mouth at bedtime for  cholesterol. 90 tablet 2  . vitamin B-12 (CYANOCOBALAMIN) 1000 MCG tablet Take 1,000 mcg by mouth daily.       No current facility-administered medications on file prior to visit.     BP 130/72 (BP Location: Right Arm, Patient Position: Sitting, Cuff Size: Normal)   Pulse 83   Temp 98.2 F (36.8 C) (Oral)   Ht 5\' 2"  (1.575 m)   Wt 160 lb 8 oz (72.8 kg)   SpO2 97%   BMI 29.36 kg/m    Objective:   Physical Exam  Constitutional: [name] appears well-nourished. [name] does not appear ill.  Appears tired  HENT:  Right Ear: Tympanic membrane and ear canal normal.  Left Ear: Tympanic membrane and ear canal normal.  Nose: No mucosal edema. Right sinus exhibits no maxillary sinus tenderness and no frontal sinus tenderness. Left sinus exhibits no maxillary sinus tenderness and no frontal sinus tenderness.  Mouth/Throat: Oropharynx is clear and moist.  Neck: Neck supple.  Cardiovascular: Normal rate and regular rhythm.  Respiratory: Effort normal and breath sounds normal. [name] has no wheezes.  Dry cough during exam  Skin: Skin is warm and dry.           Assessment & Plan:  Viral URI with Cough:  Symptoms x 2 days, [name] diagnosed with influenza 2 days ago. Exam and vitals today overall stable, doesn't appear to be flu. Rapid flu negative. Given new symptoms and that [name] lives with [name] [name], will treat with preventative dose of Tamiflu. Rx for Ladona Ridgel sent to pharmacy.  Pleas Koch, NP

## 2018-09-08 NOTE — Patient Instructions (Signed)
Your symptoms are representative of a viral illness which will resolve on its own over time. Our goal is to treat your symptoms in order to aid your body in the healing process and to make you more comfortable.   You may take Benzonatate capsules for cough. Take 1 capsule by mouth three times daily as needed for cough.  You may take the Tamiflu once daily for 10 days for flu prevention.  Make sure to stay hydrated with water and rest.  It was a pleasure to see you today!

## 2018-09-26 ENCOUNTER — Other Ambulatory Visit: Payer: Self-pay | Admitting: Neurology

## 2018-09-27 NOTE — Telephone Encounter (Signed)
Please fill

## 2018-10-02 ENCOUNTER — Other Ambulatory Visit: Payer: Self-pay | Admitting: Neurology

## 2018-10-19 ENCOUNTER — Telehealth: Payer: Self-pay | Admitting: Neurology

## 2018-10-19 MED ORDER — GABAPENTIN 100 MG PO CAPS: ORAL_CAPSULE | ORAL | 1 refills | Status: DC

## 2018-10-19 NOTE — Telephone Encounter (Signed)
Received call from after hours nursing 10/19/2018 at 12:57pm. "Caller needs her mothers prescription changed at the pharmacy."  Called [phone] and [facility] for them to call back to clarify.

## 2018-10-19 NOTE — Telephone Encounter (Signed)
Spoke with patient's daughter. Gabapentin changed last visit. RX sent to pharmacy.

## 2018-10-27 ENCOUNTER — Other Ambulatory Visit: Payer: Self-pay | Admitting: Neurology

## 2018-11-26 ENCOUNTER — Other Ambulatory Visit: Payer: Self-pay | Admitting: Neurology

## 2018-12-11 ENCOUNTER — Other Ambulatory Visit: Payer: Self-pay | Admitting: Primary Care

## 2018-12-11 DIAGNOSIS — I10 Essential (primary) hypertension: Secondary | ICD-10-CM

## 2018-12-11 DIAGNOSIS — E782 Mixed hyperlipidemia: Secondary | ICD-10-CM

## 2018-12-11 DIAGNOSIS — E119 Type 2 diabetes mellitus without complications: Secondary | ICD-10-CM

## 2018-12-13 ENCOUNTER — Ambulatory Visit: Payer: Self-pay | Admitting: Behavioral Health

## 2018-12-13 ENCOUNTER — Encounter: Payer: Self-pay | Admitting: Family Medicine

## 2018-12-14 ENCOUNTER — Ambulatory Visit: Payer: Self-pay

## 2018-12-14 ENCOUNTER — Encounter: Payer: Self-pay | Admitting: Family Medicine

## 2018-12-14 ENCOUNTER — Ambulatory Visit (INDEPENDENT_AMBULATORY_CARE_PROVIDER_SITE_OTHER): Payer: Medicare Other

## 2018-12-14 VITALS — Wt 160.0 lb

## 2018-12-14 DIAGNOSIS — Z0000 Encounter for general adult medical examination without abnormal findings: Secondary | ICD-10-CM

## 2018-12-14 NOTE — Patient Instructions (Signed)
Ms. Shedden , Thank you for taking time to come for your Medicare Wellness Visit. I appreciate your ongoing commitment to your health goals. Please review the following plan we discussed and let me know if I can assist you in the future.   These are the goals we discussed: Goals    . Patient Stated     Starting 12/14/2018, I will continue to take medication as prescribed.        This is a list of the screening recommended for you and due dates:  Health Maintenance  Topic Date Due  . Eye exam for diabetics  03/29/2018  . Hemoglobin A1C  06/14/2018  . Complete foot exam   12/13/2018  . Cologuard (Stool DNA test)  12/15/2018  . Flu Shot  02/24/2019  . Mammogram  05/17/2019  . Tetanus Vaccine  06/21/2022  . DEXA scan (bone density measurement)  Completed  .  Hepatitis C: One time screening is recommended by Center for Disease Control  (CDC) for  adults born from [birth year] through 1966.   Completed  . Pneumonia vaccines  Completed   Preventive Care for Adults  A healthy lifestyle and preventive care can promote health and wellness. Preventive health guidelines for adults include the following key practices.  . A routine yearly physical is a good way to check with your health care provider about your health and preventive screening. It is a chance to share any concerns and updates on your health and to receive a thorough exam.  . Visit your dentist for a routine exam and preventive care every 6 months. Brush your teeth twice a day and floss once a day. Good oral hygiene prevents tooth decay and gum disease.  . The frequency of eye exams is based on your age, health, family medical history, use  of contact lenses, and other factors. Follow your health care provider's recommendations for frequency of eye exams.  . Eat a healthy diet. Foods like vegetables, fruits, whole grains, low-fat dairy products, and lean protein foods contain the nutrients you need without too many calories. Decrease  your intake of foods high in solid fats, added sugars, and salt. Eat the right amount of calories for you. Get information about a proper diet from your health care provider, if necessary.  . Regular physical exercise is one of the most important things you can do for your health. Most adults should get at least 150 minutes of moderate-intensity exercise (any activity that increases your heart rate and causes you to sweat) each week. In addition, most adults need muscle-strengthening exercises on 2 or more days a week.  Silver Sneakers may be a benefit available to you. To determine eligibility, you may visit the website: www.silversneakers.com or contact program at (307)472-7502 Mon-Fri between 8AM-8PM.   . Maintain a healthy weight. The body mass index (BMI) is a screening tool to identify possible weight problems. It provides an estimate of body fat based on height and weight. Your health care provider can find your BMI and can help you achieve or maintain a healthy weight.   For adults 20 years and older: ? A BMI below 18.5 is considered underweight. ? A BMI of 18.5 to 24.9 is normal. ? A BMI of 25 to 29.9 is considered overweight. ? A BMI of 30 and above is considered obese.   . Maintain normal blood lipids and cholesterol levels by exercising and minimizing your intake of saturated fat. Eat a balanced diet with plenty of fruit and  vegetables. Blood tests for lipids and cholesterol should begin at age 13 and be repeated every 5 years. If your lipid or cholesterol levels are high, you are over 50, or you are at high risk for heart disease, you may need your cholesterol levels checked more frequently. Ongoing high lipid and cholesterol levels should be treated with medicines if diet and exercise are not working.  . If you smoke, find out from your health care provider how to quit. If you do not use tobacco, please do not start.  . If you choose to drink alcohol, please do not consume more than  2 drinks per day. One drink is considered to be 12 ounces (355 mL) of beer, 5 ounces (148 mL) of wine, or 1.5 ounces (44 mL) of liquor.  . If you are 54-1 years old, ask your health care provider if you should take aspirin to prevent strokes.  . Use sunscreen. Apply sunscreen liberally and repeatedly throughout the day. You should seek shade when your shadow is shorter than you. Protect yourself by wearing long sleeves, pants, a wide-brimmed hat, and sunglasses year round, whenever you are outdoors.  . Once a month, do a whole body skin exam, using a mirror to look at the skin on your back. Tell your health care provider of new moles, moles that have irregular borders, moles that are larger than a pencil eraser, or moles that have changed in shape or color.

## 2018-12-14 NOTE — Progress Notes (Signed)
Subjective:   [name] is a 73 y.o. female who presents for Medicare Annual (Subsequent) preventive examination.  Review of Systems:  N/A Cardiac Risk Factors include: advanced age (>18men, >59 women);diabetes mellitus;dyslipidemia;hypertension     Objective:     Vitals: Wt 160 lb (72.6 kg) Comment: patient supplied  BMI 29.26 kg/m   Body mass index is 29.26 kg/m.  Advanced Directives 12/14/2018 12/07/2017 12/06/2016 06/11/2016 03/23/2016 11/26/2013 04/04/2012  Does Patient Have a Medical Advance Directive? Yes Yes Yes Yes No Patient has advance directive, copy in chart Patient has advance directive, copy not in chart  Type of Advance Directive [place];Living will [place];Living will [place];Living will [place];Living will;Out of facility DNR (pink MOST or yellow form) - - -  Does patient want to make changes to medical advance directive? - No - Patient declined - No - Patient declined - - -  Copy of [place] in Chart? Yes - validated most recent copy scanned in chart (See row information) Yes No - copy requested No - copy requested - - -  Would patient like information on creating a medical advance directive? - - - - No - patient declined information - -  Pre-existing out of facility DNR order (yellow form or pink MOST form) - - - Physician notified to receive inpatient order - Form returned to patient/family  on discharge Yellow form placed in chart (order not valid for inpatient use)    Tobacco Social History   Tobacco Use  Smoking Status Never Smoker  Smokeless Tobacco Never Used     Counseling given: No   Clinical Intake:  Pre-visit preparation completed: Yes  Pain : No/denies pain Pain Score: 0-No pain     Nutritional Status: BMI 25 -29 Overweight Nutritional Risks: None  How often do you need to have someone help you when you read instructions, pamphlets,  or other written materials from your doctor or pharmacy?: 1 - Never What is the last grade level you completed in school?: 12th grade  Interpreter Needed?: No  Comments: pt and daughter live together Information entered by :: LPinson, LPN  Past Medical History:  Diagnosis Date  . Adjustment disorder with depressed mood   . Anxiety   . Bell's palsy    1985  . Dementia   . Depressive disorder, not elsewhere classified   . 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 DVT (deep venous thrombosis), left "early 2000's"   RLE  . Dysmetabolic syndrome X   . Essential hypertension, benign   . MWNUUVOZ(366.4) 05/2011   "pretty often since they put me on Parkinson's medicine"  . History of shingles    "as a teen and in my 52's"  . Mixed hyperlipidemia   . Morbid obesity ([place])   . Neurodegenerative gait disorder   . Parkinson disease [facility])    "they think that's what it is"  . Personal history of thrombophlebitis   . Syncope and collapse 12/06/11   "this am; I pass out fairly often"  . Type II or unspecified type diabetes mellitus without mention of complication, not stated as uncontrolled 12/06/11   "not anymore; had lap band OR"   Past Surgical History:  Procedure Laterality Date  . bladder tack  11/1995  . BREAST BIOPSY  05/20/2000   bilaterally  . BREAST CYST ASPIRATION  12/1998   bilaterally  . CARDIAC  CATHETERIZATION  2004  . CATARACT EXTRACTION W/ INTRAOCULAR LENS  IMPLANT, BILATERAL    . FRACTURE SURGERY  02/09/2006   bilateral elbows  . FRACTURE SURGERY     right knee  . FRACTURE SURGERY     tib plateau  . KNEE ARTHROSCOPY  12/12/2002   right   . LAPAROSCOPIC GASTRIC BANDING  2008  . SEPTOPLASTY  08/1998   with antral window   . TONSILLECTOMY AND ADENOIDECTOMY  1970  . TUBAL LIGATION  1970's   Family History  Problem Relation Age of Onset  . Asthma Mother   . COPD Father   . Heart disease Father   . Diabetes Sister   . Vision loss  Sister        legally blind  . Breast cancer Neg Hx    Social History   Socioeconomic History  . Marital status: Widowed    Spouse name: Not on file  . Number of children: 2  . Years of education: Not on file  . Highest education level: Not on file  Occupational History  . 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 Financial resource strain: Not on file  . Food insecurity:    Worry: Not on file    Inability: Not on file  . Transportation needs:    Medical: Not on file    Non-medical: Not on file  Tobacco Use  . Smoking status: Never Smoker  . Smokeless tobacco: Never Used  Substance and Sexual Activity  . Alcohol use: No    Comment: none  . Drug use: No  . Sexual activity: Not Currently  Lifestyle  . Physical activity:    Days per week: Not on file    Minutes per session: Not on file  . Stress: Not on file  Relationships  . Social connections:    Talks on phone: Not on file    Gets together: Not on file    Attends religious service: Not on file    Active member of club or organization: Not on file    Attends meetings of clubs or organizations: Not on file    Relationship status: Not on file  Other Topics Concern  . Not on file  Social History Narrative   Lives with daughter and granddaughter.   Daughter is HPOA.   DNR.    Outpatient Encounter Medications as of 12/14/2018  Medication Sig  . acetaminophen (TYLENOL) 325 MG tablet Take 650 mg by mouth every 6 (six) hours as needed.  Marland Kitchen aspirin 81 MG tablet Take 81 mg by mouth daily.   . benzonatate (TESSALON) 200 MG capsule Take 1 capsule (200 mg total) by mouth 3 (three) times daily as needed for cough.  Marland Kitchen Bioflavonoid Products (GRAPE SEED PO) Take 1 tablet by mouth daily.   . calcium carbonate (TUMS EX) 750 MG chewable tablet Chew 1 tablet by mouth as needed.   . Calcium Carbonate-Vitamin D (CALCIUM + D PO) Take 600 mg by mouth daily.  .  carbidopa-levodopa (SINEMET IR) 25-100 MG tablet TAKE 3 TABLETS BY MOUTH 3 TIMES A DAY  . cetirizine (ZYRTEC) 10 MG tablet Take 10 mg by mouth daily.  . Cholecalciferol (VITAMIN D3) 1000 UNITS CAPS Take 1 capsule by mouth daily.    . clonazePAM (KLONOPIN) 0.5 MG tablet TAKE 1/2 TABLET BY MOUTH AT BEDTIME AND 1/2 DAILY AS NEEDED  . denosumab (PROLIA) 60 MG/ML SOLN injection  Inject 60 mg into the skin every 6 (six) months. Administer in upper arm, thigh, or abdomen  . escitalopram (LEXAPRO) 10 MG tablet TAKE 1 TABLET BY MOUTH EVERY DAY  . feeding supplement (ENSURE COMPLETE) LIQD Take 237 mLs by mouth 2 (two) times daily between meals. (Patient taking differently: Take 237 mLs by mouth once. )  . gabapentin (NEURONTIN) 100 MG capsule 2 in the morning, 1 in the afternoon, 1 in the evening  . losartan-hydrochlorothiazide (HYZAAR) 50-12.5 MG tablet TAKE 1 TABLET BY MOUTH EVERY DAY  . mirtazapine (REMERON) 30 MG tablet TAKE 1 TABLET (30 MG TOTAL) BY MOUTH AT BEDTIME.  . Multiple Vitamins-Minerals (CENTRUM SILVER ADULT 50+) TABS Take by mouth.  . simvastatin (ZOCOR) 20 MG tablet Take 1 tablet by mouth at bedtime for cholesterol.  . vitamin B-12 (CYANOCOBALAMIN) 1000 MCG tablet Take 1,000 mcg by mouth daily.    . [DISCONTINUED] oseltamivir (TAMIFLU) 75 MG capsule Take 1 capsule (75 mg total) by mouth daily.   No facility-administered encounter medications on file as of 12/14/2018.     Activities of Daily Living In your present state of health, do you have any difficulty performing the following activities: 12/14/2018  Hearing? Y  Vision? N  Difficulty concentrating or making decisions? Y  Walking or climbing stairs? N  Dressing or bathing? N  Doing errands, shopping? Y  Preparing Food and eating ? N  Using the Toilet? N  In the past six months, have you accidently leaked urine? Y  Do you have problems with loss of bowel control? N  Managing your Medications? N  Managing your Finances? N   Housekeeping or managing your Housekeeping? N  Some recent data might be hidden    Patient Care Team: Pleas Koch, NP as PCP - General (Internal Medicine) Tat, Eustace Quail, DO as Consulting Physician (Neurology)    Assessment:   This is a routine wellness examination for [place].  Vision Screening Comments: Vision exam in July 2019 with Dr. Annamaria Helling  Exercise Activities and Dietary recommendations Current Exercise Habits: Home exercise routine, Type of exercise: walking, Time (Minutes): 30, Frequency (Times/Week): 7, Weekly Exercise (Minutes/Week): 210, Intensity: Mild, Exercise limited by: None identified  Goals    . Patient Stated     Starting 12/14/2018, I will continue to take medication as prescribed.        Fall Risk Fall Risk  12/14/2018 08/10/2018 02/07/2018 12/07/2017 09/09/2017  Falls in the past year? 0 0 No No No  Comment - - - - -  Number falls in past yr: - 0 - - -  Injury with Fall? - 0 - - -  Risk Factor Category  - - - - -  Risk for fall due to : - - - - -  Follow up - Falls evaluation completed - - -    Depression Screen PHQ 2/9 Scores 12/14/2018 12/12/2017 12/07/2017 12/06/2016  PHQ - 2 Score 3 1 0 1  PHQ- 9 Score 6 12 - -     Cognitive Function MMSE - Mini Mental State Exam 12/14/2018 12/07/2017 09/09/2017 12/06/2016 08/23/2016  Not completed: - - Refused - Refused  Orientation to time 5 4 - 5 -  Orientation to Place 5 5 - 5 -  Registration 3 3 - 3 -  Attention/ Calculation 0 5 - 0 -  Recall 2 2 - 3 -  Recall-comments unable to recall 1 of 3 words  - - - -  Language- name 2 objects 0  2 - 0 -  Language- repeat 1 1 - 1 -  Language- follow 3 step command 0 3 - 3 -  Language- read & follow direction 0 1 - 0 -  Write a sentence 0 1 - 0 -  Copy design 0 1 - 0 -  Total score 16 28 - 20 -   Montreal Cognitive Assessment  05/07/2014  Visuospatial/ Executive (0/5) 2  Naming (0/3) 2  Attention: Read list of digits (0/2) 1  Attention: Read list of letters  (0/1) 1  Attention: Serial 7 subtraction starting at 100 (0/3) 3  Language: Repeat phrase (0/2) 2  Language : Fluency (0/1) 1  Abstraction (0/2) 2  Delayed Recall (0/5) 5  Orientation (0/6) 6  Total 25  Adjusted Score (based on education) 26   PLEASE NOTE: A Mini-Cog screen was completed. Maximum score is 17. A value of 0 denotes this part of [place] MMSE was not completed or the patient failed this part of the Mini-Cog screening.   Mini-Cog Screening Orientation to Time - Max 5 pts Orientation to Place - Max 5 pts Registration - Max 3 pts Recall - Max 3 pts Language Repeat - Max 1 pts      Immunization History  Administered Date(s) Administered  . H1N1 08/29/2008  . Influenza Split 04/07/2012  . Influenza Whole 04/16/2010  . Influenza, High Dose Seasonal PF 05/12/2017  . Influenza,inj,Quad PF,6+ Mos 04/03/2013, 06/13/2014, 05/27/2015, 04/22/2016, 04/03/2018  . Pneumococcal Conjugate-13 11/26/2013  . Pneumococcal Polysaccharide-23 04/16/2010  . Td 08/29/2008  . Tdap 06/21/2012  . Zoster 04/18/2008    Screening Tests Health Maintenance  Topic Date Due  . OPHTHALMOLOGY EXAM  03/29/2018  . HEMOGLOBIN A1C  06/14/2018  . FOOT EXAM  12/13/2018  . Fecal DNA (Cologuard)  12/15/2018  . INFLUENZA VACCINE  02/24/2019  . MAMMOGRAM  05/17/2019  . TETANUS/TDAP  06/21/2022  . DEXA SCAN  Completed  . Hepatitis C Screening  Completed  . PNA vac Low Risk Adult  Completed      Plan:     I have personally reviewed, addressed, and noted the following in the patient's chart:  A. Medical and social history B. Use of alcohol, tobacco or illicit drugs  C. Current medications and supplements D. Functional ability and status E.  Nutritional status F.  Physical activity G. Advance directives H. List of other physicians I.  Hospitalizations, surgeries, and ER visits in previous 12 months J.  Vitals (unless it is a telemedicine encounter) K. Screenings to include cognitive,  depression, hearing, vision (NOTE: hearing and vision screenings not completed in telemedicine encounter) L. Referrals and appointments   In addition, I have reviewed and discussed with patient certain preventive protocols, quality metrics, and best practice recommendations. A written personalized care plan for preventive services and recommendations were provided to patient.  With patient's permission, we connected on 12/14/18 at  1:30 PM EDT by a video enabled telemedicine application. Two patient identifiers were used to ensure the encounter occurred with the correct person.    Patient was in home and writer was in office.   Signed,   Lindell Noe, MHA, BS, LPN Health Coach

## 2018-12-14 NOTE — Progress Notes (Signed)
PCP notes:   Health maintenance:  Foot exam - PCP follow-up requested Cologuard - PCP follow-up requested/due 12/15/18 A1C - ordered  Abnormal screenings:   Depression score: 6 Depression screen [facility] 2/9 12/14/2018 12/12/2017 12/07/2017 12/06/2016 12/04/2015  Decreased Interest 0 0 0 0 0  Down, Depressed, Hopeless 3 1 0 1 1  PHQ - 2 Score 3 1 0 1 1  Altered sleeping 0 2 - - -  Tired, decreased energy 0 3 - - -  Change in appetite 3 2 - - -  Feeling bad or failure about yourself  0 0 - - -  Trouble concentrating 0 2 - - -  Moving slowly or fidgety/restless 0 2 - - -  Suicidal thoughts 0 0 - - -  PHQ-9 Score 6 12 - - -  Difficult doing work/chores Not difficult at all Not difficult at all - - -  Some recent data might be hidden   Mini-Cog score: 16/17 MMSE - Mini Mental State Exam 12/14/2018 12/07/2017 09/09/2017 12/06/2016 08/23/2016  Not completed: - - Refused - Refused  Orientation to time 5 4 - 5 -  Orientation to Place 5 5 - 5 -  Registration 3 3 - 3 -  Attention/ Calculation 0 5 - 0 -  Recall 2 2 - 3 -  Recall-comments unable to recall 1 of 3 words  - - - -  Language- name 2 objects 0 2 - 0 -  Language- repeat 1 1 - 1 -  Language- follow 3 step command 0 3 - 3 -  Language- read & follow direction 0 1 - 0 -  Write a sentence 0 1 - 0 -  Copy design 0 1 - 0 -  Total score 16 28 - 20 -   Patient concerns:   None  Nurse concerns:  None  Next PCP appt:   12/20/18 @ 0940

## 2018-12-15 ENCOUNTER — Other Ambulatory Visit (INDEPENDENT_AMBULATORY_CARE_PROVIDER_SITE_OTHER): Payer: Medicare Other

## 2018-12-15 ENCOUNTER — Telehealth: Payer: Self-pay | Admitting: Primary Care

## 2018-12-15 ENCOUNTER — Other Ambulatory Visit: Payer: Self-pay

## 2018-12-15 DIAGNOSIS — E782 Mixed hyperlipidemia: Secondary | ICD-10-CM | POA: Diagnosis not present

## 2018-12-15 DIAGNOSIS — I10 Essential (primary) hypertension: Secondary | ICD-10-CM | POA: Diagnosis not present

## 2018-12-15 DIAGNOSIS — E119 Type 2 diabetes mellitus without complications: Secondary | ICD-10-CM

## 2018-12-15 LAB — COMPREHENSIVE METABOLIC PANEL
ALT: 5 U/L (ref 0–35)
AST: 11 U/L (ref 0–37)
Albumin: 3.5 g/dL (ref 3.5–5.2)
Alkaline Phosphatase: 61 U/L (ref 39–117)
BUN: 19 mg/dL (ref 6–23)
CO2: 30 mEq/L (ref 19–32)
Calcium: 9.5 mg/dL (ref 8.4–10.5)
Chloride: 103 mEq/L (ref 96–112)
Creatinine, Ser: 0.68 mg/dL (ref 0.40–1.20)
GFR: 84.59 mL/min (ref >60.00)
Glucose, Bld: 126 mg/dL — ABNORMAL HIGH (ref 70–99)
Potassium: 4 mEq/L (ref 3.5–5.1)
Sodium: 141 mEq/L (ref 135–145)
Total Bilirubin: 0.3 mg/dL (ref 0.2–1.2)
Total Protein: 5.9 g/dL — ABNORMAL LOW (ref 6.0–8.3)

## 2018-12-15 LAB — LIPID PANEL
Cholesterol: 149 mg/dL (ref 0–200)
HDL: 53.1 mg/dL (ref >39.00)
LDL Cholesterol: 68 mg/dL (ref 0–99)
NonHDL: 95.47
Total CHOL/HDL Ratio: 3
Triglycerides: 137 mg/dL (ref 0.0–149.0)
VLDL: 27.4 mg/dL (ref 0.0–40.0)

## 2018-12-15 LAB — HEMOGLOBIN A1C: Hgb A1c MFr Bld: 6.7 % — ABNORMAL HIGH (ref 4.6–6.5)

## 2018-12-15 NOTE — Telephone Encounter (Signed)
Prolia benefits submitted. °

## 2018-12-16 LAB — COLOGUARD: Cologuard: NEGATIVE

## 2018-12-19 NOTE — Progress Notes (Signed)
I reviewed health advisor's note, was available for consultation, and agree with documentation and plan.  

## 2018-12-20 ENCOUNTER — Ambulatory Visit (INDEPENDENT_AMBULATORY_CARE_PROVIDER_SITE_OTHER): Payer: Medicare Other | Admitting: Primary Care

## 2018-12-20 DIAGNOSIS — Z1239 Encounter for other screening for malignant neoplasm of breast: Secondary | ICD-10-CM

## 2018-12-20 DIAGNOSIS — E2839 Other primary ovarian failure: Secondary | ICD-10-CM

## 2018-12-20 DIAGNOSIS — M810 Age-related osteoporosis without current pathological fracture: Secondary | ICD-10-CM | POA: Diagnosis not present

## 2018-12-20 DIAGNOSIS — Z1211 Encounter for screening for malignant neoplasm of colon: Secondary | ICD-10-CM

## 2018-12-20 DIAGNOSIS — I10 Essential (primary) hypertension: Secondary | ICD-10-CM | POA: Diagnosis not present

## 2018-12-20 DIAGNOSIS — G231 Progressive supranuclear ophthalmoplegia [Steele-Richardson-Olszewski]: Secondary | ICD-10-CM

## 2018-12-20 DIAGNOSIS — F341 Dysthymic disorder: Secondary | ICD-10-CM

## 2018-12-20 DIAGNOSIS — E782 Mixed hyperlipidemia: Secondary | ICD-10-CM

## 2018-12-20 DIAGNOSIS — E119 Type 2 diabetes mellitus without complications: Secondary | ICD-10-CM

## 2018-12-20 DIAGNOSIS — R569 Unspecified convulsions: Secondary | ICD-10-CM

## 2018-12-20 NOTE — Assessment & Plan Note (Signed)
Following with neurology, feels well managed. Continue same.

## 2018-12-20 NOTE — Assessment & Plan Note (Signed)
Recent lipid panel stable, continue Simvastatin.

## 2018-12-20 NOTE — Assessment & Plan Note (Signed)
Remains seizure free. Following with neurology.

## 2018-12-20 NOTE — Assessment & Plan Note (Signed)
Recent A1C stable, continue off of meds.  Pneumonia vaccination due this year, she will have this done before Fall 2020. Managed on ARB and statin therapy. Foot exam due next visit. Eye exam due later this summer.  Follow up in 6 months.

## 2018-12-20 NOTE — Assessment & Plan Note (Signed)
Not checking BP regularly, last office reading stable. Continue current regimen. CMP reviewed and is stable.

## 2018-12-20 NOTE — Assessment & Plan Note (Signed)
Endorses Prolia injection at [facility] site in February 2020, this is not documented. Last documentation was from August 2019.  Given that she is sure that she had this done we will wait until August for her next injection. Compliant to vitamin D and calcium. Repeat bone density pending for later this Summer. Orders placed. Recent calcium level stable.

## 2018-12-20 NOTE — Patient Instructions (Signed)
Continue calcium and vitamin D daily.  Call to schedule your next Prolia injection for August 2020.  Call to schedule your pneumonia vaccination for later this Summer.  Complete your bone density scan and mammogram later this year as discussed.  We will have Cologuard send another kit to your home for colon cancer screening.  Please schedule a follow up appointment in 6 months for diabetes check.  It was a pleasure to see you today! Allie Bossier, NP-C

## 2018-12-20 NOTE — Progress Notes (Signed)
Subjective:    Patient ID: [name], female    DOB: [birth year], 73 y.o.   MRN: [id number]  HPI  Virtual Visit via Video Note  I connected with [name] on 12/20/18 at  9:40 AM EDT by a video enabled telemedicine application and verified that I am speaking with the correct person using two identifiers.  Location: Patient: Home Provider: Office   I discussed the limitations of evaluation and management by telemedicine and the availability of in person appointments. The patient expressed understanding and agreed to proceed.  History of Present Illness:  Ms. Anastos is a 73 year old female who presents today for follow up of chronic conditions.  1) Anxiety and Depression: Currently following with neurology and is managed on Lexapro 10 mg, clonazepam 0.5 mg PRN, mirtazapine 30 mg. She feels well managed on her current regimen, denies SI/HI.  2) Progressive Supranuclear Palsy/Bell's Palsy: Currently following with neurology and is managed on gabapentin 100 mg, carbidopa-levodopa, clonazepam. Feels well managed. Follows with neurology semi-annually.  3) Essential Hypertension: Currently managed on losartan-HCTZ 50-12.5 mg. She denies chest pain, shortness of breath, dizziness. She does not check her blood pressure at home.  BP Readings from Last 3 Encounters:  09/08/18 130/72  08/10/18 140/90  08/04/18 126/82     4) Hyperlipidemia: Currently managed on Simvastatin 20 mg. Her recent lipid panel with LDL of 68. She endorses a fair diet, doesn't exercise much given recent pandemic.   5) Type 2 Diabetes:   Current medications include: None   Last A1C: 6.7 in May 2020 Last Eye Exam: Due in July 2020 Last Foot Exam: Due next visit  Pneumonia Vaccination: Completed in 2015, due ACE/ARB: Losartan  Statin: Simvastatin   6) Osteoporosis: Currently managed on Prolia injections every 6 months. She endorses that her last injection was in February 2020 this year through  Dr. Hulen Shouts office prior to her transfer to our office, last documentation of Prolia injection was from August 29th 2019. She is compliant to daily calcium and vitamin D.    Observations/Objective:  Alert and oriented. Appears well, not sickly. No distress. Speaking in complete sentences.   Assessment and Plan:  See problem based charting.  Follow Up Instructions:  Continue calcium and vitamin D daily.  Call to schedule your next Prolia injection for August 2020.  Call to schedule your pneumonia vaccination for later this Summer.  Complete your bone density scan and mammogram later this year as discussed.  We will have Cologuard send another kit to your home for colon cancer screening.  Please schedule a follow up appointment in 6 months for diabetes check.  It was a pleasure to see you today! Allie Bossier, NP-C      I discussed the assessment and treatment plan with the patient. The patient was provided an opportunity to ask questions and all were answered. The patient agreed with the plan and demonstrated an understanding of the instructions.   The patient was advised to call back or seek an in-person evaluation if the symptoms worsen or if the condition fails to improve as anticipated.     Pleas Koch, NP    Review of Systems  Constitutional: Negative for unexpected weight change.  Eyes: Negative for visual disturbance.  Respiratory: Negative for shortness of breath.   Cardiovascular: Negative for chest pain.  Neurological: Negative for dizziness and headaches.  Psychiatric/Behavioral: Negative for suicidal ideas. The patient is not nervous/anxious.  Past Medical History:  Diagnosis Date  . Adjustment disorder with depressed mood   . Anxiety   . Bell's palsy    1985  . Dementia   . Depressive disorder, not elsewhere classified   . 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 DVT (deep venous thrombosis), left "early  2000's"   RLE  . Dysmetabolic syndrome X   . Essential hypertension, benign   . BSWHQPRF(163.8) 05/2011   "pretty often since they put me on Parkinson's medicine"  . History of shingles    "as a teen and in my 30's"  . Mixed hyperlipidemia   . Morbid obesity ([place])   . Neurodegenerative gait disorder   . Parkinson disease [facility]    "they think that's what it is"  . Personal history of thrombophlebitis   . Syncope and collapse 12/06/11   "this am; I pass out fairly often"  . Type II or unspecified type diabetes mellitus without mention of complication, not stated as uncontrolled 12/06/11   "not anymore; had lap band OR"     Social History   Socioeconomic History  . Marital status: Widowed    Spouse name: Not on file  . Number of children: 2  . Years of education: Not on file  . Highest education level: Not on file  Occupational History  . 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 Financial resource strain: Not on file  . Food insecurity:    Worry: Not on file    Inability: Not on file  . Transportation needs:    Medical: Not on file    Non-medical: Not on file  Tobacco Use  . Smoking status: Never Smoker  . Smokeless tobacco: Never Used  Substance and Sexual Activity  . Alcohol use: No    Comment: none  . Drug use: No  . Sexual activity: Not Currently  Lifestyle  . Physical activity:    Days per week: Not on file    Minutes per session: Not on file  . Stress: Not on file  Relationships  . Social connections:    Talks on phone: Not on file    Gets together: Not on file    Attends religious service: Not on file    Active member of club or organization: Not on file    Attends meetings of clubs or organizations: Not on file    Relationship status: Not on file  . Intimate partner violence:    Fear of current or ex partner: Not on file    Emotionally abused: Not on file    Physically abused: Not on file     Forced sexual activity: Not on file  Other Topics Concern  . Not on file  Social History Narrative   Lives with daughter and granddaughter.   Daughter is HPOA.   DNR.    Past Surgical History:  Procedure Laterality Date  . bladder tack  11/1995  . BREAST BIOPSY  05/20/2000   bilaterally  . BREAST CYST ASPIRATION  12/1998   bilaterally  . CARDIAC CATHETERIZATION  2004  . CATARACT EXTRACTION W/ INTRAOCULAR LENS  IMPLANT, BILATERAL    . FRACTURE SURGERY  02/09/2006   bilateral elbows  . FRACTURE SURGERY     right knee  . FRACTURE SURGERY     tib plateau  . KNEE ARTHROSCOPY  12/12/2002   right   .  LAPAROSCOPIC GASTRIC BANDING  2008  . SEPTOPLASTY  08/1998   with antral window   . TONSILLECTOMY AND ADENOIDECTOMY  1970  . TUBAL LIGATION  1970's    Family History  Problem Relation Age of Onset  . Asthma Mother   . COPD Father   . Heart disease Father   . Diabetes Sister   . Vision loss Sister        legally blind  . Breast cancer Neg Hx     Allergies  Allergen Reactions  . Codeine Sulfate     REACTION: hallucinations  . Penicillins Anaphylaxis, Swelling and Rash  . Wellbutrin [Bupropion] Other (See Comments)    Seizures and hallucinations    Current Outpatient Medications on File Prior to Visit  Medication Sig Dispense Refill  . acetaminophen (TYLENOL) 325 MG tablet Take 650 mg by mouth every 6 (six) hours as needed.    Marland Kitchen aspirin 81 MG tablet Take 81 mg by mouth daily.     Marland Kitchen Bioflavonoid Products (GRAPE SEED PO) Take 1 tablet by mouth daily.     . calcium carbonate (TUMS EX) 750 MG chewable tablet Chew 1 tablet by mouth as needed.     . Calcium Carbonate-Vitamin D (CALCIUM + D PO) Take 600 mg by mouth daily.    . carbidopa-levodopa (SINEMET IR) 25-100 MG tablet TAKE 3 TABLETS BY MOUTH 3 TIMES A DAY 810 tablet 1  . cetirizine (ZYRTEC) 10 MG tablet Take 10 mg by mouth daily.    . Cholecalciferol (VITAMIN D3) 1000 UNITS CAPS Take 1 capsule by mouth daily.      .  clonazePAM (KLONOPIN) 0.5 MG tablet TAKE 1/2 TABLET BY MOUTH AT BEDTIME AND 1/2 DAILY AS NEEDED 90 tablet 1  . denosumab (PROLIA) 60 MG/ML SOLN injection Inject 60 mg into the skin every 6 (six) months. Administer in upper arm, thigh, or abdomen    . escitalopram (LEXAPRO) 10 MG tablet TAKE 1 TABLET BY MOUTH EVERY DAY 90 tablet 0  . feeding supplement (ENSURE COMPLETE) LIQD Take 237 mLs by mouth 2 (two) times daily between meals. (Patient taking differently: Take 237 mLs by mouth once. ) 1 Bottle 11  . gabapentin (NEURONTIN) 100 MG capsule 2 in the morning, 1 in the afternoon, 1 in the evening 360 capsule 1  . losartan-hydrochlorothiazide (HYZAAR) 50-12.5 MG tablet TAKE 1 TABLET BY MOUTH EVERY DAY 90 tablet 3  . mirtazapine (REMERON) 30 MG tablet TAKE 1 TABLET (30 MG TOTAL) BY MOUTH AT BEDTIME. 90 tablet 3  . Multiple Vitamins-Minerals (CENTRUM SILVER ADULT 50+) TABS Take by mouth.    . simvastatin (ZOCOR) 20 MG tablet Take 1 tablet by mouth at bedtime for cholesterol. 90 tablet 2  . vitamin B-12 (CYANOCOBALAMIN) 1000 MCG tablet Take 1,000 mcg by mouth daily.       No current facility-administered medications on file prior to visit.     There were no vitals taken for this visit.   Objective:   Physical Exam  Constitutional: She is oriented to person, place, and time. She appears well-nourished.  Respiratory: Effort normal.  Neurological: She is alert and oriented to person, place, and time.  Skin: Skin is dry.  Psychiatric: She has a normal mood and affect.           Assessment & Plan:

## 2018-12-20 NOTE — Assessment & Plan Note (Signed)
Following with neurology, continue current regimen.

## 2018-12-22 ENCOUNTER — Telehealth: Payer: Self-pay | Admitting: Primary Care

## 2018-12-22 NOTE — Telephone Encounter (Signed)
Spoke w/pt's daughter, Kennyth Lose today.  Pt and daughter were almost sure pt had Prolia in Feb 2020@ Dr. Hulen Shouts office w/next injection Aug 2020 (see 12-20-2018 virtual OV note).   I spoke w/[email or web address]'s office today.  They don't show pt having this injection/or being seen in their office since Dr. Gentry Fitz began seeing her in Jan 2020.  Daughter states they have not received any bills/EOB to show any Prolia injections since her last in Aug 2019.  Pt's daughter, Kennyth Lose, is now questioning if pt really did have this Feb 2020 injection.  Will plan on pt having next Prolia injection end of Aug 2020 to ensure pt safety.

## 2018-12-22 NOTE — Telephone Encounter (Signed)
Agree with this plan of August 2020.

## 2018-12-26 ENCOUNTER — Telehealth: Payer: Self-pay | Admitting: Primary Care

## 2018-12-26 ENCOUNTER — Other Ambulatory Visit: Payer: Self-pay | Admitting: Neurology

## 2018-12-26 NOTE — Telephone Encounter (Signed)
Absolutely.

## 2018-12-26 NOTE — Telephone Encounter (Signed)
Pt and daughter would like to know if ok to wait to do her DEXA in October when her mammo is due?

## 2018-12-27 NOTE — Telephone Encounter (Signed)
Pt and daughter agree.  Plan on Aug 2020 for next Prolia injection.

## 2018-12-27 NOTE — Telephone Encounter (Signed)
Pt scheduled for both 05-18-2019.  Pt and pt's daughter, Kennyth Lose aware of appts.

## 2018-12-27 NOTE — Telephone Encounter (Signed)
Requested Prescriptions   Pending Prescriptions Disp Refills  . escitalopram (LEXAPRO) 10 MG tablet [Pharmacy Med Name: ESCITALOPRAM 10 MG TABLET] 90 tablet 0    Sig: TAKE 1 TABLET BY MOUTH EVERY DAY   Rx last filled:10/02/18 #90 0 refills  Pt last seen: 08/10/18 3.  Depression.              -on remeron.  Managed by PCP.             - I think her depression is situational and related to not being able to drive.  They don't want to pursue driving eval.                -No SI/HI             -I encouraged her to exercise safely.               -better on lexapro than prozac  Follow up appt scheduled:02/13/19

## 2019-01-24 ENCOUNTER — Other Ambulatory Visit: Payer: Self-pay | Admitting: Primary Care

## 2019-01-24 DIAGNOSIS — E782 Mixed hyperlipidemia: Secondary | ICD-10-CM

## 2019-01-25 ENCOUNTER — Telehealth: Payer: Self-pay | Admitting: Primary Care

## 2019-01-25 DIAGNOSIS — F341 Dysthymic disorder: Secondary | ICD-10-CM

## 2019-01-25 MED ORDER — MIRTAZAPINE 30 MG PO TABS: 30.0000 mg | ORAL_TABLET | Freq: Every day | ORAL | 3 refills | Status: DC

## 2019-01-25 NOTE — Telephone Encounter (Signed)
Pt daughter called to request refill for mirtazapine sent to [facility], [address], [place]. Last filled by Dr Deborra Medina, cannot request thru pharmacy, pt has a weeks worth left.

## 2019-01-25 NOTE — Telephone Encounter (Addendum)
Noted, refill sent to pharmacy.  Vallarie Mare, can you check on her Cologuard?

## 2019-01-25 NOTE — Telephone Encounter (Signed)
Also, pt has not heard anything about the cologuard test kit so she would like to check on this. ° ° °

## 2019-01-25 NOTE — Addendum Note (Signed)
Addended by: Pleas Koch on: 01/25/2019 05:34 PM   Modules accepted: Orders

## 2019-01-29 NOTE — Telephone Encounter (Signed)
I have check in [place] and did not see the order. I went ahead and fax a new order for patient. Patient's daughter has been notified.

## 2019-02-09 ENCOUNTER — Encounter: Payer: Self-pay | Admitting: Neurology

## 2019-02-09 ENCOUNTER — Ambulatory Visit: Payer: Self-pay | Admitting: Neurology

## 2019-02-09 NOTE — Progress Notes (Signed)
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  This patient is accompanied in the office by her child who supplements the history.      She is on carbidopa/levodopa 25/100, 3 tablets 3 times per day.  She is doing well on this medication, without side effects.  She had one fall the day before Thanksgiving.  She was in a parking lot of a restaurant and tripped over something(curb).  She fx her nose and had to have her lip stitched up.    Since then, however, she has done very well.  Her daughter reports that she has been very stable on her feet.  Last visit, she was complaining of a decreased appetite and increased anxiety.  Therefore, I increased her Remeron to 30 mg at night.  This helped with both sleep and appetite and she is very pleased.  She also has a hx of seizure, likely secondary to Wellbutrin.  Last visit, her keppra was d/c.  She has been seizure free since that time.  However, mood and depression continue to be an issue.  I sent her to Dr. Cheryln Manly but she didn't follow up.  She states that she cannot talk to him without her daughter present.  Her biggest issue is that her granddaughter is a Ship broker and she lives with the patient.  Apparently, the majority of the house is clean and safe with the exception of the granddaughters room.  The patient states that the mess just drives her crazy and she cannot stop thinking about it.  Her granddaughter goes to community college and her leaving the house is not an option.  04/03/13 update:  [name].  She was supposed to f/u with Dr Cheryln Manly due to significant anxiety and depression and she cancelled her 3/4, 3/13 and 3/27 visits and never r/s.  She has not seen him since 10/13.  She states that she is just "not going to talk with someone."  She, however, is doing some better in that regard.  Much of her stress surrounds her granddaughter, who lives with  her and is a Ship broker.  The patient states that her granddaughter has now admitted to this, but to seek help.  They were able to get some of the room cleaned up a while ago and that seemed to really help the patient's mental state.  However, the room has reconnected and is now just as it previously was.  She also reports a significant amount of stress over her sister who has end-stage Alzheimer's dementia.  Her sister no longer recognizes her, and the patient realizes when she goes around her sister, it makes her symptoms much worse.  She feels that her memory is getting worse, but nothing like her sisters.  She also thinks that her speech is getting worse.    She is having some headaches, similar to what she had in the past, prior to starting on the Levodopa.  It is over the L forehead, but not in the eye.  It is pounding.  It is daily.  It does not cause n/v or cause photophobia or phonophobia.  It makes her feel "drained."  It does not prevent her from doing things.  There are no lateralizing paresthesias with the headaches.  There are no vision changes.  07/30/13 update:  The pt is on carbidopa/levodopa 25/100 three tablets three times per day.  She is doing well in that  regard.  She did not go to PT because of transportation issues.  She was started on zanaflex last visit for HA and was also started on the exelon patch.  Interestingly, she states that the exelon patch caused headaches but it was started after the headache.  Nonetheless, she stopped it and still has headache.  It starts in the L forehead and radiates over the L side of the head.  She still thinks that the patch made them worse however and also thinks that it caused UTI's.  She is still on zanaflex but isn't sure its helping.  No falls/hallucination/syncope/lightheadedness.  11/27/13 update:  Patient is following up today in the movement disorder clinic.  She is currently on carbidopa/levodopa 25/100, 3 tablets by mouth 3 times per day.  Since our  last visit, I did get a note from Dr. Domingo Cocking at the headache clinic.  He started the patient on Indocin 25 mg 3 times a day and she was told to titrate to 75 mg 3 times a day.  She states that she had SE with that and she was changed to gabapentin 100 mg and that is helping.  He also scheduled her for trigger point injections and an MRI brain.  She did not care for the trigger point injections although they did help.  She has f/u in June.  Walking seems to have been a little slower, but no falls.  Her daughter is with her today and feels that walking has been more tenuous.   No hallucinations.  No diplopia but is having blurry vision.  Does have vision appt in [facility] in few weeks.  05/07/14 update:   Patient is following up today in the movement disorder clinic.  She is currently on carbidopa/levodopa 25/100, 3 tablets by mouth 3 times per day.  Daughter states that she often forgets middle of the day dose but pt denies that.  She takes it at 9-10 am on awakening, 3 pm and bedtime.  She and her daughter both think that it is helpful.   No diplopia.  No falls.  She saw Dr. Domingo Cocking in June and I got a note from him.  She had more trigger point injections.  She is doing really well from a headache standpoint.  She doesn't have to return to him for 6 months (saw him in September).  She continues on remeron for her depression and mood.  She states that she hates that she is confined to the home and depression is tied into that.  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  She then asks if her daughter can come back to the room for the discussion and she did.   She did do therapy with [facility] and she thought that it went well.   10/08/14 update:   Patient is following up today in the movement disorder clinic. She is accompanied by her daughter who supplements the hx.  She is currently on carbidopa/levodopa 25/100, 3 tablets  by mouth 3 times per day.  C/o memory loss.  States that her daughter will tell her to go to the kitchen to get things and she will get there and not be able to remember.  Her daughter thinks that she jumps subjects.  Also c/o feet paresthesias.  Worries that DM has "returned" as used to be diabetic but not anymore.  Last A1C in April was 6.0.  Also  states that right leg below the knee posteriorally she is having paresthesias.  Started after she bent over one day and the knee "popped."  No LBP.  On gabapentin but that is for headache, which is well controlled.  She has been headache free for a long time.  Received a note from Dr. Domingo Cocking recently and pt told to continue meds.    04/10/15 update:  The patient is following up today.  She is on carbidopa/levodopa 25/100, 3 tablets 3 times per day.  Because I was not convinced that the patient had a dementia but her previous neuropsych test years ago said that she did and because some of the patient's depression surrounds the fact that her daughter will not let her drive because of this potential diagnosis, we repeated neuropsych testing.  While there was evidence of cognitive change due to depression, she did not meet the criteria for the diagnosis of dementia.  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  It was recommended that she undergo psychiatric evaluation and treatment, but she refused.  She was again given a brochure for the driving evaluator in [place] to have a driving evaluation.  She reports that her depression was fairly well under control until the end of 2022/12/14 when her sister died and she has been appropriately grieving.  She did have an EMG done since last visit.  This was done on 11/07/2014 There was no evidence of a large fiber peripheral neuropathy, but there was evidence of a chronic, moderate right L2-L4 radiculopathy and a chronic, mild L5 radiculopathy on the right.  She fell one time getting out  of the chair as the right leg gave out.  She also rolled out of the bed at night one time.   She states that she was dreaming.   Fortunately, she did not get hurt with either of these.    10/20/15 update:  The patient follows up today, accompanied by her daughter who supplements the history.  She has a history of vascular parkinsonism and is currently on carbidopa/levodopa 25/100, 3 tablets 3 times per day.  Reports that she is a little "fidgety" and that she just can't stay still.  No new medications.  She also has a history of depression and is prescribed Remeron by her primary care physician.  Her depression mostly surrounds the fact that she cannot leave the house because her daughter will not let her drive.  Depression is improved as she has gotten some cats that keep her company.  We have talked previously about a driving evaluation, as I think she likely could drive from a neurologic standpoint.  Her daughter does not want her to pursue that.  No falls.   She has been headache free on gabapentin and doesn't want to change that  04/22/16 update:  The patient follows up today, accompanied by her daughter supplements the history.  She is on carbidopa/levodopa 25/100, 3 tablets 3 times per day.  She has had 2 falls.  With the first, she got up and made her bed, which she doesn't even remember but she does remember hitting the ground.  With the second one, her toilet was leaking and she fell over the wet towels on the floor and she hit her L head.  She went to the ER for this one, which was 8/29.  She has had a sore spot on the L occiput since. Was given tramadol but made her too sleepy. She had  a CT brain that day and I reviewed it.  It was nonacute and only WMD and mild atrophy.   She denies lightheadedness or near syncope.  She is on Remeron for her depression and feels that she has been stable in that regard.  Reports that "I talk to my cats while my family is working."   She has had no hallucinations.  She  is not particularly active with cardiovascular exercise.  Headaches well controlled on gabapentin 100 mg tid except sore occiput where she fell.  She also uses that for lumbar radiculopathy but reports that is better.    08/23/16 update:  The patient returns today, accompanied by her daughter who supplements the history.  She remains on carbidopa/levodopa 25/100, 3 tablets 3 times per day.  Overall, she feels that she has been a little shaky. Pt states that she fell one time; she was cleaning up a toilet that overflowed and tripped over towels (she told me about this one last time).  With another instance, she woke up and was totally dressed and making bed and had fallen.  She was awake when daughter went in but she doesn't remember if she was sleep walking or not.  Pt denies lightheadedness, near syncope.  No hallucinations.  Mood has been stable on remeron.  No SI/HI.  Has a chronic lumbar radiculopathy and hx of headache, well controlled on gabapentin 100 mg tid.  She has about 1 headache a week and lumbar radiculopathy is resolved.  She thinks that her short term memory hasn't been as good.  She will go into the kitchen and forget why she is there.  Long term memory is good.    05/09/17 update:  Patient seen today in follow-up.  I have not seen the patient for about 9 months.  Reports that she was started on prolia since our last visit.  She is accompanied by her daughter who supplements the history.  Patient is on carbidopa/levodopa 25/100, 3 tablets 3 times per day.  She fell out of the bed while sleeping.  Her daughter isn't convinced she was sleeping because her bed was "made up."  Patient insists she was sleeping because someone was chasing her.  She states that she is hallucinating because she is hearing "engines and music."  She is stumbling more but not falling during the day.  C/o continued short term memory loss.    09/09/17 update: The patient is seen today in follow-up.  She is accompanied by her  daughter who supplements history.  She remains on carbidopa/levodopa 25/100, 3 tablets 3 times per day.  She saw Dr. Si Raider on August 11, 2017 and f/u with her 09/08/17 for results of repeat neurocognitive testing.  This demonstrated similar results as the prior times she has had this.  There is no evidence of dementia.  There is evidence of significant depression and GAD.  last visit, the patient was complaining about REM behavior disorder.  I told her to discontinue the Xanax and we replaced that with a very small dose of clonazepam, 0.25 mg twice per day.  She reports that "I've gotten used to it and now I'm sleeping well."  She is no longer dreaming/acting out dreams.  No falls since our last visit.    She has L trap pain.  02/07/18 update: Patient is seen today in follow-up for vascular parkinsonism.  She is accompanied by her daughter who supplements history.  Patient is on carbidopa/levodopa 25/100, 3 tablets 3 times per day.  She has had no falls since last visit.   Daughter thinks that she has been more stable since our last visit.  [place]   No lightheadedness or near syncope.  Prozac was stopped last visit.  She was started on Lexapro.  She reports that this was helpful.  Mood is good and she thinks that lexapro helps RBD.  She is also on a small dose of clonazepam, 0.25 mg twice per day.  She reports that this is helping anxiety and sleep.  Records have been reviewed since our last visit.  Patient saw her primary care physician on Dec 12, 2017.    08/10/18 update: Patient is seen today in follow-up for vascular parkinsonism, accompanied by her daughter who supplements history.  Patient is on carbidopa/levodopa 25/100, 3 tablets 3 times per day.  No falls since last visit.  No lightheadedness or near syncope.  Mood is good on lexapro.  She uses klonopin, 0.25 mg bid for a combination of anxiety as well as sleep.  That has helped.  Records are reviewed since last visit.  She was  recently seen by her nurse practitioner at primary care for dysuria.  After urine culture, Bactrim DS was called in for the patient.  She is feeling better.  She states that headaches have been returning.  In bilateral frontal region.  On gabapentin 100 mg tid. Comes and goes in the day.  Thinks weather may play a role.  Daughter thinks that mostly related to sinus issues.    02/13/19 update: Patient seen today for follow-up of vascular parkinsonism.  Patient remains on carbidopa/levodopa 25/100, 3 tablets 3 times per day.  Despite the diagnosis of vascular parkinsonism, patient and daughter feel that this medication works.  There have been no falls.  No lightheadedness or near syncope.  Her mood has been well on Lexapro.  She is on clonazepam 0.25 mg twice per day for anxiety and sleep and thinks that this has been beneficial.  No cognitive change.  Increased gabapentin to 100mg , 2 in the AM, 1 in the afternoon, 1 in the evening.  Headaches resolved with the increase.   Last seen by her primary nurse practitioner on Dec 20, 2018.  This was through a video visit.  I have reviewed the records.  PREVIOUS MEDICATIONS: Sinemet; wellbutrin (seizure); prozac (lost efficacy)  ALLERGIES:   Allergies  Allergen Reactions  . Codeine Sulfate     REACTION: hallucinations  . Penicillins Anaphylaxis, Swelling and Rash  . Wellbutrin [Bupropion] Other (See Comments)    Seizures and hallucinations    CURRENT MEDICATIONS:  Current Outpatient Medications on File Prior to Visit  Medication Sig Dispense Refill  . acetaminophen (TYLENOL) 325 MG tablet Take 650 mg by mouth every 6 (six) hours as needed.    Marland Kitchen aspirin 81 MG tablet Take 81 mg by mouth daily.     Marland Kitchen Bioflavonoid Products (GRAPE SEED PO) Take 1 tablet by mouth daily.     . calcium carbonate (TUMS EX) 750 MG chewable tablet Chew 1 tablet by mouth as needed.     . Calcium Carbonate-Vitamin D (CALCIUM + D PO) Take 600 mg by mouth daily.    .  carbidopa-levodopa (SINEMET IR) 25-100 MG tablet TAKE 3 TABLETS BY MOUTH 3 TIMES A DAY 810 tablet 1  . cetirizine (ZYRTEC) 10 MG tablet Take 10 mg by mouth daily.    . Cholecalciferol (VITAMIN D3) 1000 UNITS CAPS Take 1 capsule by mouth daily.      Marland Kitchen  clonazePAM (KLONOPIN) 0.5 MG tablet TAKE 1/2 TABLET BY MOUTH AT BEDTIME AND 1/2 DAILY AS NEEDED 90 tablet 1  . escitalopram (LEXAPRO) 10 MG tablet TAKE 1 TABLET BY MOUTH EVERY DAY 90 tablet 0  . feeding supplement (ENSURE COMPLETE) LIQD Take 237 mLs by mouth 2 (two) times daily between meals. (Patient taking differently: Take 237 mLs by mouth once. ) 1 Bottle 11  . gabapentin (NEURONTIN) 100 MG capsule 2 in the morning, 1 in the afternoon, 1 in the evening 360 capsule 1  . losartan-hydrochlorothiazide (HYZAAR) 50-12.5 MG tablet TAKE 1 TABLET BY MOUTH EVERY DAY 90 tablet 3  . mirtazapine (REMERON) 30 MG tablet Take 1 tablet (30 mg total) by mouth at bedtime. For sleep. 90 tablet 3  . Multiple Vitamins-Minerals (CENTRUM SILVER ADULT 50+) TABS Take by mouth.    . simvastatin (ZOCOR) 20 MG tablet TAKE 1 TABLET BY MOUTH EVERY DAY AT BEDTIME FOR CHOLESTEROL 90 tablet 1  . vitamin B-12 (CYANOCOBALAMIN) 1000 MCG tablet Take 1,000 mcg by mouth daily.       No current facility-administered medications on file prior to visit.     PAST MEDICAL HISTORY:   Past Medical History:  Diagnosis Date  . Adjustment disorder with depressed mood   . Anxiety   . Bell's palsy    1985  . Dementia   . Depressive disorder, not elsewhere classified   . 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 DVT (deep venous thrombosis), left "early 2000's"   RLE  . Dysmetabolic syndrome X   . Essential hypertension, benign   . HALPFXTK(240.9) 05/2011   "pretty often since they put me on Parkinson's medicine"  . History of shingles    "as a teen and in my 35's"  . Mixed hyperlipidemia   . Morbid obesity ([place])   . Neurodegenerative gait disorder   .  Personal history of thrombophlebitis   . Syncope and collapse 12/06/11   "this am; I pass out fairly often"  . Type II or unspecified type diabetes mellitus without mention of complication, not stated as uncontrolled 12/06/11   "not anymore; had lap band OR"    PAST SURGICAL HISTORY:   Past Surgical History:  Procedure Laterality Date  . bladder tack  11/1995  . BREAST BIOPSY  05/20/2000   bilaterally  . BREAST CYST ASPIRATION  12/1998   bilaterally  . CARDIAC CATHETERIZATION  2004  . CATARACT EXTRACTION W/ INTRAOCULAR LENS  IMPLANT, BILATERAL    . FRACTURE SURGERY  02/09/2006   bilateral elbows  . FRACTURE SURGERY     right knee  . FRACTURE SURGERY     tib plateau  . KNEE ARTHROSCOPY  12/12/2002   right   . LAPAROSCOPIC GASTRIC BANDING  2008  . SEPTOPLASTY  08/1998   with antral window   . TONSILLECTOMY AND ADENOIDECTOMY  1970  . TUBAL LIGATION  1970's    SOCIAL HISTORY:   Social History   Socioeconomic History  . Marital status: Widowed    Spouse name: Not on file  . Number of children: 2  . Years of education: Not on file  . Highest education level: Some college, no degree  Occupational History  . 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 Financial resource strain: Not on file  . Food insecurity    Worry: Not on file    Inability:  Not on file  . Transportation needs    Medical: Not on file    Non-medical: Not on file  Tobacco Use  . Smoking status: Never Smoker  . Smokeless tobacco: Never Used  Substance and Sexual Activity  . Alcohol use: No    Comment: none  . Drug use: No  . Sexual activity: Not Currently  Lifestyle  . Physical activity    Days per week: Not on file    Minutes per session: Not on file  . Stress: Not on file  Relationships  . Social Herbalist on phone: Not on file    Gets together: Not on file    Attends religious service: Not on file    Active member of club  or organization: Not on file    Attends meetings of clubs or organizations: Not on file    Relationship status: Not on file  . Intimate partner violence    Fear of current or ex partner: Not on file    Emotionally abused: Not on file    Physically abused: Not on file    Forced sexual activity: Not on file  Other Topics Concern  . Not on file  Social History Narrative   Lives with daughter and granddaughter.   Daughter is HPOA.   DNR.    FAMILY HISTORY:   Family Status  Relation Name Status  . Mother  Deceased at age 74       natural causes  . Father  Deceased at age 24       MI  . Sister  Deceased       DM-2  . Child  Alive       2, HTN, DM-2, hyperlipidemia  . Son  Alive  . Neg Hx  (Not Specified)    ROS:  Review of Systems  Constitutional: Negative.   HENT: Negative.   Eyes: Negative.   Respiratory: Negative.   Cardiovascular: Negative.   Gastrointestinal: Negative.   Genitourinary: Negative.   Musculoskeletal: Negative.   Skin: Negative.     PHYSICAL EXAMINATION:    VITALS:   Vitals:   02/13/19 0804  BP: (!) 115/58  Pulse: 80  Temp: 97.9 F (36.6 C)  SpO2: 97%  Weight: 162 lb (73.5 kg)  Height: 5\' 3"  (1.6 m)    GEN:  The patient appears stated age and is in NAD. HEENT:  Normocephalic, atraumatic.  The mucous membranes are moist. The superficial temporal arteries are without ropiness or tenderness. CV:  RRR Lungs:  CTAB Neck/HEME:  There are no carotid bruits bilaterally.  Neurological examination:  Orientation: The patient is alert and oriented x3. Cranial nerves: There is good facial symmetry. The speech is fluent and clear. Soft palate rises symmetrically and there is no tongue deviation. Hearing is intact to conversational tone. Sensation: Sensation is intact to light touch throughout Motor: Strength is 5/5 in the bilateral upper and lower extremities.   Shoulder shrug is equal and symmetric.  There is no pronator drift.  Movement  examination: Tone: There is normal tone in the UE/LE Abnormal movements:  No tremor is noted  Coordination:  There is mild decremation with toe taps bilaterally Gait and Station: The patient pushes off of the chair.  She actually walks well down the hall with decreased arm swing bilaterally  LABS:  Lab Results  Component Value Date   WBC 8.3 12/06/2016   HGB 13.3 02/06/2017   HCT 39.0 02/06/2017   MCV 94.3  12/06/2016   PLT 244.0 12/06/2016     Chemistry      Component Value Date/Time   NA 141 12/15/2018 0900   K 4.0 12/15/2018 0900   CL 103 12/15/2018 0900   CO2 30 12/15/2018 0900   BUN 19 12/15/2018 0900   CREATININE 0.68 12/15/2018 0900      Component Value Date/Time   CALCIUM 9.5 12/15/2018 0900   ALKPHOS 61 12/15/2018 0900   AST 11 12/15/2018 0900   ALT 5 12/15/2018 0900   BILITOT 0.3 12/15/2018 0900     Lab Results  Component Value Date   TSH 1.04 12/07/2017   Lab Results  Component Value Date   VITAMINB12 1,387 (H) 11/16/2012    Lab Results  Component Value Date   HGBA1C 6.7 (H) 12/15/2018   Lab Results  Component Value Date   ESRSEDRATE 18 04/03/2013    ASSESSMENT/PLAN: 1.  Vascular Parkinsonism.    -she will remain on carbidopa/levodopa 25/100, 3 tablets 3 times per day.  Risks, benefits, side effects and alternative therapies were discussed.  The opportunity to ask questions was given and they were answered to the best of my ability.  The patient expressed understanding and willingness to follow the outlined treatment protocols. 2.  Remote hx of seizure.  This is likely secondary to the initiation of wellbutrin.  She should remain off of this medication.  EEG in the hospital was normal.  She has been seizure-free for many years. 3.  Depression.   -on remeron (and lexapro).  Managed by PCP.  - I think her depression is situational and related to not being able to drive.  Has actually improved over the years as she has come to the conclusion that she  just is not going to drive.   -No SI/HI  -Iast EKG was in 2017.  Discussed potential for prolonged QT with lexapro (albeit small).  Should check EKG.  Will get that scheduled at [facility]. 4.  Memory change  -is pseudodementia.  No evidence of true dementia 5.  RBD  -off xanax.  On klonopin and doing well.  Takes 0.25 mg bid (morning for anxiety) 6.  Headache  -better with gabapentin  100 mg - 2 in the AM, 1 in the afternoon and evening.  May try decreasing in the future but just got control over headache again.   7. F/u 6 months.  Much greater than 50% of this visit was spent in counseling and coordinating care.  Total face to face time:  25 min

## 2019-02-13 ENCOUNTER — Ambulatory Visit: Payer: Self-pay

## 2019-02-13 ENCOUNTER — Encounter: Payer: Self-pay | Admitting: Neurology

## 2019-02-13 ENCOUNTER — Other Ambulatory Visit: Payer: Self-pay

## 2019-02-13 ENCOUNTER — Ambulatory Visit (INDEPENDENT_AMBULATORY_CARE_PROVIDER_SITE_OTHER): Payer: Medicare Other | Admitting: Neurology

## 2019-02-13 VITALS — BP 115/58 | HR 80 | Temp 97.9°F | Ht 63.0 in | Wt 162.0 lb

## 2019-02-13 DIAGNOSIS — Z1211 Encounter for screening for malignant neoplasm of colon: Secondary | ICD-10-CM | POA: Diagnosis not present

## 2019-02-13 DIAGNOSIS — R51 Headache: Secondary | ICD-10-CM | POA: Diagnosis not present

## 2019-02-13 DIAGNOSIS — R519 Headache, unspecified: Secondary | ICD-10-CM

## 2019-02-13 DIAGNOSIS — G214 Vascular parkinsonism: Secondary | ICD-10-CM | POA: Diagnosis not present

## 2019-02-13 DIAGNOSIS — F330 Major depressive disorder, recurrent, mild: Secondary | ICD-10-CM

## 2019-02-13 DIAGNOSIS — Z1212 Encounter for screening for malignant neoplasm of rectum: Secondary | ICD-10-CM | POA: Diagnosis not present

## 2019-02-13 DIAGNOSIS — Z5181 Encounter for therapeutic drug level monitoring: Secondary | ICD-10-CM

## 2019-02-13 NOTE — Patient Instructions (Signed)
-   You have been scheduled for EKG nurse visit appt today at 10:00 am at [facility] at [place]. Please call with them with any question at [phone].

## 2019-02-14 ENCOUNTER — Ambulatory Visit (INDEPENDENT_AMBULATORY_CARE_PROVIDER_SITE_OTHER): Payer: Medicare Other | Admitting: Cardiology

## 2019-02-14 VITALS — BP 188/85 | HR 77 | Temp 96.9°F | Ht 62.0 in | Wt 162.7 lb

## 2019-02-14 DIAGNOSIS — G214 Vascular parkinsonism: Secondary | ICD-10-CM | POA: Diagnosis not present

## 2019-02-14 NOTE — Progress Notes (Signed)
EKG performed today for evaluation of QT interval with being on Lexapro at the request of Dr. Carles Collet. Normal QT interval noted.

## 2019-02-14 NOTE — Progress Notes (Signed)
EKG 02/14/2019: Normal sinus rhythm at 78 bpm with 1 PAC, left atrial enlargement, left axis deviation, left anterior fascicular block, IRBBB. Diffuse non-specific T wave abnormality. Normal QT interval.

## 2019-02-15 ENCOUNTER — Encounter: Payer: Self-pay | Admitting: Primary Care

## 2019-02-21 ENCOUNTER — Other Ambulatory Visit: Payer: Self-pay | Admitting: Primary Care

## 2019-02-21 DIAGNOSIS — F341 Dysthymic disorder: Secondary | ICD-10-CM

## 2019-02-26 ENCOUNTER — Telehealth: Payer: Self-pay | Admitting: Primary Care

## 2019-02-26 MED ORDER — LOSARTAN POTASSIUM-HCTZ 50-12.5 MG PO TABS: 1.0000 | ORAL_TABLET | Freq: Every day | ORAL | 1 refills | Status: DC

## 2019-02-26 NOTE — Telephone Encounter (Signed)
[name] pt daughter called to get a refill Losartan   [facility] [address]   Best number [phone]

## 2019-02-26 NOTE — Telephone Encounter (Signed)
Refill sent as requested. Patient's verbalized understanding.

## 2019-03-01 ENCOUNTER — Ambulatory Visit (INDEPENDENT_AMBULATORY_CARE_PROVIDER_SITE_OTHER): Payer: Medicare Other | Admitting: *Deleted

## 2019-03-01 DIAGNOSIS — M810 Age-related osteoporosis without current pathological fracture: Secondary | ICD-10-CM | POA: Diagnosis not present

## 2019-03-01 MED ORDER — DENOSUMAB 60 MG/ML ~~LOC~~ SOSY
60.0000 mg | PREFILLED_SYRINGE | Freq: Once | SUBCUTANEOUS | Status: AC
  Administered 2019-03-01: 60 mg via SUBCUTANEOUS

## 2019-03-01 NOTE — Progress Notes (Signed)
Per orders of Dr. Diona Browner in Martha Friendly, NP absence, injection of Prolia given by Virl Cagey. Patient tolerated injection well.

## 2019-03-08 NOTE — Progress Notes (Signed)
Approved.  

## 2019-03-23 ENCOUNTER — Other Ambulatory Visit: Payer: Self-pay | Admitting: Neurology

## 2019-03-23 NOTE — Telephone Encounter (Signed)
Requested Prescriptions   Pending Prescriptions Disp Refills  . escitalopram (LEXAPRO) 10 MG tablet [Pharmacy Med Name: ESCITALOPRAM 10 MG TABLET] 90 tablet 0    Sig: TAKE 1 TABLET BY MOUTH EVERY DAY   Rx last filled:12/27/18 #90 0 REFILLS  Pt last seen: 02/13/19   Follow up appt scheduled:08/20/2019

## 2019-03-23 NOTE — Telephone Encounter (Signed)
Provider approved 

## 2019-03-26 ENCOUNTER — Other Ambulatory Visit: Payer: Self-pay | Admitting: Neurology

## 2019-03-26 NOTE — Telephone Encounter (Signed)
Requested Prescriptions   Pending Prescriptions Disp Refills  . clonazePAM (KLONOPIN) 0.5 MG tablet [Pharmacy Med Name: CLONAZEPAM 0.5 MG TABLET] 90 tablet     Sig: TAKE 1/2 TABLET BY MOUTH AT BEDTIME AND 1/2 DAILY AS NEEDED  Rx last filled:09/27/18 #90 1 refills  Pt last seen: 02/13/19  Follow up appt scheduled: 08/20/2019

## 2019-03-27 NOTE — Telephone Encounter (Signed)
Provider aproved

## 2019-03-30 ENCOUNTER — Ambulatory Visit (INDEPENDENT_AMBULATORY_CARE_PROVIDER_SITE_OTHER): Payer: Medicare Other

## 2019-03-30 ENCOUNTER — Other Ambulatory Visit: Payer: Self-pay

## 2019-03-30 DIAGNOSIS — Z23 Encounter for immunization: Secondary | ICD-10-CM | POA: Diagnosis not present

## 2019-04-26 ENCOUNTER — Other Ambulatory Visit: Payer: Self-pay | Admitting: Neurology

## 2019-04-26 NOTE — Telephone Encounter (Signed)
Requested Prescriptions   Pending Prescriptions Disp Refills  . carbidopa-levodopa (SINEMET IR) 25-100 MG tablet [Pharmacy Med Name: CARBIDOPA-LEVODOPA 25-100 TAB] 810 tablet 1    Sig: TAKE 3 TABLETS BY MOUTH 3 TIMES A DAY  . gabapentin (NEURONTIN) 100 MG capsule [Pharmacy Med Name: GABAPENTIN 100 MG CAPSULE] 360 capsule 1    Sig: 2 IN THE MORNING, 1 IN THE AFTERNOON, 1 IN THE EVENING   Rx last filled:10/29/18 #810 1 refills 10/19/18 #360 1 refills  Pt last seen: 02/13/19  Follow up appt scheduled:08/20/2019

## 2019-05-01 ENCOUNTER — Other Ambulatory Visit: Payer: Self-pay | Admitting: Family Medicine

## 2019-05-01 ENCOUNTER — Other Ambulatory Visit: Payer: Self-pay | Admitting: Primary Care

## 2019-05-01 ENCOUNTER — Other Ambulatory Visit: Payer: Self-pay | Admitting: Neurology

## 2019-05-01 DIAGNOSIS — F341 Dysthymic disorder: Secondary | ICD-10-CM

## 2019-05-01 DIAGNOSIS — E782 Mixed hyperlipidemia: Secondary | ICD-10-CM

## 2019-05-02 NOTE — Telephone Encounter (Signed)
Requested Prescriptions   Pending Prescriptions Disp Refills  . gabapentin (NEURONTIN) 100 MG capsule [Pharmacy Med Name: GABAPENTIN 100 MG CAPSULE] 270 capsule 1    Sig: TAKE 1 CAPSULE BY MOUTH THREE TIMES A DAY   Rx last filled: 04/26/19 #360 1 refills  Pt last seen:02/13/19   Follow up appt scheduled: [id number] Denied duplicate request

## 2019-05-18 ENCOUNTER — Ambulatory Visit: Payer: Medicare Other

## 2019-05-18 ENCOUNTER — Other Ambulatory Visit: Payer: Medicare Other

## 2019-06-18 ENCOUNTER — Ambulatory Visit: Payer: Medicare Other | Admitting: Primary Care

## 2019-06-19 ENCOUNTER — Other Ambulatory Visit: Payer: Self-pay | Admitting: Primary Care

## 2019-06-29 ENCOUNTER — Ambulatory Visit: Payer: Medicare Other | Admitting: Primary Care

## 2019-07-02 ENCOUNTER — Ambulatory Visit (INDEPENDENT_AMBULATORY_CARE_PROVIDER_SITE_OTHER): Payer: Medicare Other | Admitting: Internal Medicine

## 2019-07-02 ENCOUNTER — Encounter: Payer: Self-pay | Admitting: Internal Medicine

## 2019-07-02 ENCOUNTER — Other Ambulatory Visit: Payer: Self-pay

## 2019-07-02 ENCOUNTER — Ambulatory Visit: Payer: Medicare Other | Admitting: Family Medicine

## 2019-07-02 ENCOUNTER — Other Ambulatory Visit: Payer: Medicare Other

## 2019-07-02 DIAGNOSIS — R109 Unspecified abdominal pain: Secondary | ICD-10-CM

## 2019-07-02 DIAGNOSIS — R8290 Unspecified abnormal findings in urine: Secondary | ICD-10-CM | POA: Diagnosis not present

## 2019-07-02 DIAGNOSIS — R350 Frequency of micturition: Secondary | ICD-10-CM | POA: Diagnosis not present

## 2019-07-02 LAB — POC URINALSYSI DIPSTICK (AUTOMATED)
Bilirubin, UA: NEGATIVE
Blood, UA: NEGATIVE
Glucose, UA: NEGATIVE
Leukocytes, UA: NEGATIVE
Nitrite, UA: NEGATIVE
Protein, UA: POSITIVE — AB
Spec Grav, UA: >=1.03 — AB (ref 1.010–1.025)
Urobilinogen, UA: 0.2 E.U./dL
pH, UA: 6 (ref 5.0–8.0)

## 2019-07-02 NOTE — Patient Instructions (Signed)
Urinary Frequency, Adult Urinary frequency means urinating more often than usual. You may urinate every 1-2 hours even though you drink a normal amount of fluid and do not have a bladder infection or condition. Although you urinate more often than normal, the total amount of urine produced in a day is normal. With urinary frequency, you may have an urgent need to urinate often. The stress and anxiety of needing to find a bathroom quickly can make this urge worse. This condition may go away on its own or you may need treatment at home. Home treatment may include bladder training, exercises, taking medicines, or making changes to your diet. Follow these instructions at home: Bladder health   Keep a bladder diary if told by your health care provider. Keep track of: ? What you eat and drink. ? How often you urinate. ? How much you urinate.  Follow a bladder training program if told by your health care provider. This may include: ? Learning to delay going to the bathroom. ? Double urinating (voiding). This helps if you are not completely emptying your bladder. ? Scheduled voiding.  Do Kegel exercises as told by your health care provider. Kegel exercises strengthen the muscles that help control urination, which may help the condition. Eating and drinking  If told by your health care provider, make diet changes, such as: ? Avoiding caffeine. ? Drinking fewer fluids, especially alcohol. ? Not drinking in the evening. ? Avoiding foods or drinks that may irritate the bladder. These include coffee, tea, soda, artificial sweeteners, citrus, tomato-based foods, and chocolate. ? Eating foods that help prevent or ease constipation. Constipation can make this condition worse. Your health care provider may recommend that you:  Drink enough fluid to keep your urine pale yellow.  Take over-the-counter or prescription medicines.  Eat foods that are high in fiber, such as beans, whole grains, and fresh  fruits and vegetables.  Limit foods that are high in fat and processed sugars, such as fried or sweet foods. General instructions  Take over-the-counter and prescription medicines only as told by your health care provider.  Keep all follow-up visits as told by your health care provider. This is important. Contact a health care provider if:  You start urinating more often.  You feel pain or irritation when you urinate.  You notice blood in your urine.  Your urine looks cloudy.  You develop a fever.  You begin vomiting. Get help right away if:  You are unable to urinate. Summary  Urinary frequency means urinating more often than usual. With urinary frequency, you may urinate every 1-2 hours even though you drink a normal amount of fluid and do not have a bladder infection or other bladder condition.  Your health care provider may recommend that you keep a bladder diary, follow a bladder training program, or make dietary changes.  If told by your health care provider, do Kegel exercises to strengthen the muscles that help control urination.  Take over-the-counter and prescription medicines only as told by your health care provider.  Contact a health care provider if your symptoms do not improve or get worse. This information is not intended to replace advice given to you by your health care provider. Make sure you discuss any questions you have with your health care provider. Document Released: 05/08/2009 Document Revised: 01/19/2018 Document Reviewed: 01/19/2018 Elsevier Patient Education  2020 [employer].

## 2019-07-02 NOTE — Addendum Note (Signed)
Addended by: Lurlean Nanny on: 07/02/2019 02:20 PM   Modules accepted: Orders

## 2019-07-02 NOTE — Progress Notes (Signed)
Virtual Visit via Video Note  I connected with [name] on 07/02/19 at  2:00 PM EST by a video enabled telemedicine application and verified that I am speaking with the correct person using two identifiers.  Location: Patient: Home Provider: Office   I discussed the limitations of evaluation and management by telemedicine and the availability of in person appointments. The patient expressed understanding and agreed to proceed.  HPI  Pt reports urinary frequency, right flank pain and urine odor. This started 2-3 weeks ago. She denies urgency, dysuria or blood in her urine. She denies vaginal discharge, odor, itching or abnormal bleeding. She denies fever, chills, nausea or low back pain. She has tried AZO OTC with minimal relief. She has had kidney stones in the past and she reports this feels different.   Review of Systems  Past Medical History:  Diagnosis Date  . Adjustment disorder with depressed mood   . Anxiety   . Bell's palsy    1985  . Dementia   . Depressive disorder, not elsewhere classified   . 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 DVT (deep venous thrombosis), left "early 2000's"   RLE  . Dysmetabolic syndrome X   . Essential hypertension, benign   . [id number]) 05/2011   "pretty often since they put me on Parkinson's medicine"  . History of shingles    "as a teen and in my 77's"  . Mixed hyperlipidemia   . Morbid obesity ([place])   . Neurodegenerative gait disorder   . Personal history of thrombophlebitis   . Syncope and collapse 12/06/11   "this am; I pass out fairly often"  . Type II or unspecified type diabetes mellitus without mention of complication, not stated as uncontrolled 12/06/11   "not anymore; had lap band OR"    Family History  Problem Relation Age of Onset  . Asthma Mother   . COPD Father   . Heart disease Father   . Diabetes Sister   . Vision loss Sister        legally blind  . Hypertension Child    . Hyperlipidemia Child   . Diabetes Child   . Diabetes Son   . Breast cancer Neg Hx     Social History   Socioeconomic History  . Marital status: Widowed    Spouse name: Not on file  . Number of children: 2  . Years of education: Not on file  . Highest education level: Some college, no degree  Occupational History  . 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 Financial resource strain: Not on file  . Food insecurity    Worry: Not on file    Inability: Not on file  . Transportation needs    Medical: Not on file    Non-medical: Not on file  Tobacco Use  . Smoking status: Never Smoker  . Smokeless tobacco: Never Used  Substance and Sexual Activity  . Alcohol use: No    Comment: none  . Drug use: No  . Sexual activity: Not Currently  Lifestyle  . Physical activity    Days per week: Not on file    Minutes per session: Not on file  . Stress: Not on file  Relationships  . Social Herbalist on phone: Not on file    Gets together: Not on file  Attends religious service: Not on file    Active member of club or organization: Not on file    Attends meetings of clubs or organizations: Not on file    Relationship status: Not on file  . Intimate partner violence    Fear of current or ex partner: Not on file    Emotionally abused: Not on file    Physically abused: Not on file    Forced sexual activity: Not on file  Other Topics Concern  . Not on file  Social History Narrative   Lives with daughter and granddaughter.   Daughter is HPOA.   DNR.    Allergies  Allergen Reactions  . Codeine Sulfate     REACTION: hallucinations  . Penicillins Anaphylaxis, Swelling and Rash  . Wellbutrin [Bupropion] Other (See Comments)    Seizures and hallucinations     Constitutional: Denies fever, malaise, fatigue, headache or abrupt weight changes.   GU: Pt reports urgency, right flank pain and urine odor. Denies  frequency, dysuria, burning sensation, blood in urine, odor or discharge.   No other specific complaints in a complete review of systems (except as listed in HPI above).    Objective:   Physical Exam  Wt Readings from Last 3 Encounters:  02/14/19 162 lb 11.2 oz (73.8 kg)  02/13/19 162 lb (73.5 kg)  12/14/18 160 lb (72.6 kg)    General: Appears her stated age, obese, in NAD. Pulmonary/Chest: Normal effort. No respiratory distress.  Abdomen: Soft. Normal bowel sounds. No distention or masses noted.  Tender to palpation over the bladder area. No CVA tenderness.        Assessment & Plan:   Urinary Frequency, Right Flank Pain and Urine Odor  Urinalysis: negative Will send urine culture OK to take AZO OTC Drink plenty of fluids  RTC as needed or if symptoms persist. Webb Silversmith, NP   Follow Up Instructions:    I discussed the assessment and treatment plan with the patient. The patient was provided an opportunity to ask questions and all were answered. The patient agreed with the plan and demonstrated an understanding of the instructions.   The patient was advised to call back or seek an in-person evaluation if the symptoms worsen or if the condition fails to improve as anticipated.     Webb Silversmith, NP

## 2019-07-03 LAB — URINE CULTURE
MICRO NUMBER:: 1170590
SPECIMEN QUALITY:: ADEQUATE

## 2019-07-29 ENCOUNTER — Other Ambulatory Visit: Payer: Self-pay | Admitting: Primary Care

## 2019-07-29 DIAGNOSIS — F341 Dysthymic disorder: Secondary | ICD-10-CM

## 2019-08-14 NOTE — Progress Notes (Deleted)
_  Virtual Visit via Video Note The purpose of this virtual visit is to provide medical care while limiting exposure to the novel coronavirus.    Consent was obtained for video visit:  {yes no:[id number]} Answered questions that patient had about telehealth interaction:  {yes no:[id number]} I discussed the limitations, risks, security and privacy concerns of performing an evaluation and management service by telemedicine. I also discussed with the patient that there may be a patient responsible charge related to this service. The patient expressed understanding and agreed to proceed.  Pt location: Home Physician Location: office Name of referring provider:  Pleas Koch, NP I connected with [name] at patients initiation/request on 08/20/2019 at  8:15 AM EST by video enabled telemedicine application and verified that I am speaking with the correct person using two identifiers. Pt MRN:  [id number] Pt DOB:  [birth year] Video Participants:  [name];  ***   History of Present Illness: *** Patient seen today in follow-up for parkinsonism.  Pt denies falls.  Pt denies lightheadedness, near syncope.  No hallucinations.  Mood has been good.  On mirtazapine and Lexapro.  Last EKG was February 14, 2019.  I reviewed them.  She had a normal QT interval.  Still takes clonazepam, 0.25 mg twice per day.  She seems to do well with that.  In regards to headaches, she states that ***  Current movement d/o meds:  ***Carbidopa/levodopa 25/100, 3 tablets 3 times per day Gabapentin 100 mg, 2 tablets in the morning, 1 in the afternoon and 1 in the evening Clonazepam 0.5 mg, half a tablet twice per day Lexapro 10 mg daily Mirtazapine 30 mg at bedtime   Current Outpatient Medications on File Prior to Visit  Medication Sig Dispense Refill  . acetaminophen (TYLENOL) 325 MG tablet Take 650 mg by mouth every 6 (six) hours as needed.    Marland Kitchen aspirin 81 MG tablet Take 81 mg by mouth daily.     Marland Kitchen Bioflavonoid  Products (GRAPE SEED PO) Take 1 tablet by mouth daily.     . calcium carbonate (TUMS EX) 750 MG chewable tablet Chew 1 tablet by mouth as needed.     . Calcium Carbonate-Vitamin D (CALCIUM + D PO) Take 600 mg by mouth daily.    . carbidopa-levodopa (SINEMET IR) 25-100 MG tablet TAKE 3 TABLETS BY MOUTH 3 TIMES A DAY 810 tablet 1  . cetirizine (ZYRTEC) 10 MG tablet Take 10 mg by mouth daily.    . Cholecalciferol (VITAMIN D3) 1000 UNITS CAPS Take 1 capsule by mouth daily.      . clonazePAM (KLONOPIN) 0.5 MG tablet TAKE 1/2 TABLET BY MOUTH AT BEDTIME AND 1/2 DAILY AS NEEDED 90 tablet 1  . escitalopram (LEXAPRO) 10 MG tablet TAKE 1 TABLET BY MOUTH EVERY DAY 90 tablet 2  . feeding supplement (ENSURE COMPLETE) LIQD Take 237 mLs by mouth 2 (two) times daily between meals. (Patient taking differently: Take 237 mLs by mouth once. ) 1 Bottle 11  . gabapentin (NEURONTIN) 100 MG capsule TAKE 1 CAPSULE BY MOUTH THREE TIMES A DAY 270 capsule 1  . losartan-hydrochlorothiazide (HYZAAR) 50-12.5 MG tablet TAKE 1 TABLET BY MOUTH DAILY FOR BLOOD PRESSURE 90 tablet 1  . mirtazapine (REMERON) 30 MG tablet Take 1 tablet (30 mg total) by mouth at bedtime. For sleep. 90 tablet 3  . Multiple Vitamins-Minerals (CENTRUM SILVER ADULT 50+) TABS Take by mouth.    . simvastatin (ZOCOR) 20 MG tablet TAKE 1 TABLET BY  MOUTH EVERY DAY AT BEDTIME FOR CHOLESTEROL 90 tablet 0  . vitamin B-12 (CYANOCOBALAMIN) 1000 MCG tablet Take 1,000 mcg by mouth daily.       No current facility-administered medications on file prior to visit.     Observations/Objective:   There were no vitals filed for this visit. GEN:  The patient appears stated age and is in NAD.  Neurological examination:  Orientation: The patient is alert and oriented x3. Cranial nerves: There is good facial symmetry. There is ***facial hypomimia.  The speech is fluent and clear. Soft palate rises symmetrically and there is no tongue deviation. Hearing is intact to  conversational tone. Motor: Strength is at least antigravity x 4.   Shoulder shrug is equal and symmetric.  There is no pronator drift.  Movement examination: Tone: unable Abnormal movements: *** Coordination:  There is *** decremation with RAM's, *** Gait and Station: The patient has *** difficulty arising out of a deep-seated chair without the use of the hands. The patient's stride length is ***.      Assessment and Plan:   1.  Vascular Parkinsonism.               -***Continue carbidopa/levodopa 25/100, 3 tablets 3 times per day 2.  Remote hx of seizure.  This is likely secondary to the initiation of wellbutrin.  She should remain off of this medication.  EEG in the hospital was normal.  She has been seizure-free for many years. 3.  Depression.              -on remeron and Lexapro  -QT interval in July, 2020 was normal 4.  Memory change             -is pseudodementia.  No evidence of true dementia 5.  RBD             -off xanax.  On klonopin and doing well.  Takes 0.25 mg bid (morning for anxiety) 6.  Headache             -better with gabapentin  100 mg - 2 in the AM, 1 in the afternoon and evening.  May try decreasing in the future but just got control over headache again.    Follow Up Instructions:    -I discussed the assessment and treatment plan with the patient. The patient was provided an opportunity to ask questions and all were answered. The patient agreed with the plan and demonstrated an understanding of the instructions.   The patient was advised to call back or seek an in-person evaluation if the symptoms worsen or if the condition fails to improve as anticipated.    Total time spent on today's visit was ***minutes, including both face-to-face time and nonface-to-face time.  Time included that spent on review of records (prior notes available to me/labs/imaging if pertinent), discussing treatment and goals, answering patient's questions and coordinating  care.   Alonza Bogus, DO

## 2019-08-15 ENCOUNTER — Ambulatory Visit
Admission: RE | Admit: 2019-08-15 | Discharge: 2019-08-15 | Disposition: A | Discharge status: Home / Self Care | Payer: Medicare Other | Source: Ambulatory Visit | Attending: Primary Care | Admitting: Primary Care

## 2019-08-15 ENCOUNTER — Other Ambulatory Visit: Payer: Self-pay

## 2019-08-15 DIAGNOSIS — Z1239 Encounter for other screening for malignant neoplasm of breast: Secondary | ICD-10-CM

## 2019-08-15 DIAGNOSIS — Z1231 Encounter for screening mammogram for malignant neoplasm of breast: Secondary | ICD-10-CM | POA: Diagnosis not present

## 2019-08-15 DIAGNOSIS — M810 Age-related osteoporosis without current pathological fracture: Secondary | ICD-10-CM

## 2019-08-15 DIAGNOSIS — Z780 Asymptomatic menopausal state: Secondary | ICD-10-CM | POA: Diagnosis not present

## 2019-08-15 DIAGNOSIS — M85851 Other specified disorders of bone density and structure, right thigh: Secondary | ICD-10-CM | POA: Diagnosis not present

## 2019-08-15 DIAGNOSIS — E2839 Other primary ovarian failure: Secondary | ICD-10-CM

## 2019-08-20 ENCOUNTER — Telehealth: Payer: Medicare Other | Admitting: Neurology

## 2019-09-03 NOTE — Progress Notes (Signed)
Virtual Visit via Video Note The purpose of this virtual visit is to provide medical care while limiting exposure to the novel coronavirus.    Consent was obtained for video visit:  Yes.   Answered questions that patient had about telehealth interaction:  Yes.   I discussed the limitations, risks, security and privacy concerns of performing an evaluation and management service by telemedicine. I also discussed with the patient that there may be a patient responsible charge related to this service. The patient expressed understanding and agreed to proceed.  Pt location: Home Physician Location: home Name of referring provider:  Pleas Koch, NP I connected with Wilburt Finlay at patients initiation/request on 09/05/2019 at 10:45 AM EST by video enabled telemedicine application and verified that I am speaking with the correct person using two identifiers. Pt MRN:  [id number] Pt DOB:  October 24, 1945 Video Participants:  Wilburt Finlay;  Daughter supplements hx   History of Present Illness:  Pt seen in f/u for Parkinsons Disease.  Pt had one fall out of the bed while sleeping.  The other fall was in December and "I just went down."  States that she slid on her butt to get the bedroom and then pulled herself up on the bedpost and got up.  She was really sore.  she had another spell where she thought she was going to pass out.  She was walking and got dizzy and sat down quickly and called her daughter and she came over and pt was clammy.  The clammy part went away quick but she didn't feel good all day.  Her BS was good but she didn't have anything to check her BP (does now).    No hallucinations.  More headaches.    Current movement d/o meds:  Carbidopa/levodopa 25/100, 3 tablets 3 times per day Mirtazapine 30 mg at night Gabapentin 100 mg, 2 tablets in the morning, 1 in the afternoon and 1 in the evening.   Current Outpatient Medications on File Prior to Visit  Medication Sig Dispense  Refill  . acetaminophen (TYLENOL) 325 MG tablet Take 650 mg by mouth every 6 (six) hours as needed.    Marland Kitchen aspirin 81 MG tablet Take 81 mg by mouth daily.     Marland Kitchen Bioflavonoid Products (GRAPE SEED PO) Take 1 tablet by mouth daily.     . calcium carbonate (TUMS EX) 750 MG chewable tablet Chew 1 tablet by mouth as needed.     . Calcium Carbonate-Vitamin D (CALCIUM + D PO) Take 600 mg by mouth daily.    . carbidopa-levodopa (SINEMET IR) 25-100 MG tablet TAKE 3 TABLETS BY MOUTH 3 TIMES A DAY 810 tablet 1  . cetirizine (ZYRTEC) 10 MG tablet Take 10 mg by mouth daily.    . Cholecalciferol (VITAMIN D3) 1000 UNITS CAPS Take 1 capsule by mouth daily.      . clonazePAM (KLONOPIN) 0.5 MG tablet TAKE 1/2 TABLET BY MOUTH AT BEDTIME AND 1/2 DAILY AS NEEDED 90 tablet 1  . escitalopram (LEXAPRO) 10 MG tablet TAKE 1 TABLET BY MOUTH EVERY DAY 90 tablet 2  . feeding supplement (ENSURE COMPLETE) LIQD Take 237 mLs by mouth 2 (two) times daily between meals. (Patient taking differently: Take 237 mLs by mouth once. ) 1 Bottle 11  . gabapentin (NEURONTIN) 100 MG capsule TAKE 1 CAPSULE BY MOUTH THREE TIMES A DAY 270 capsule 1  . losartan-hydrochlorothiazide (HYZAAR) 50-12.5 MG tablet TAKE 1 TABLET BY MOUTH DAILY FOR BLOOD PRESSURE  90 tablet 1  . mirtazapine (REMERON) 30 MG tablet Take 1 tablet (30 mg total) by mouth at bedtime. For sleep. 90 tablet 3  . Multiple Vitamins-Minerals (CENTRUM SILVER ADULT 50+) TABS Take by mouth.    . simvastatin (ZOCOR) 20 MG tablet TAKE 1 TABLET BY MOUTH EVERY DAY AT BEDTIME FOR CHOLESTEROL 90 tablet 0  . vitamin B-12 (CYANOCOBALAMIN) 1000 MCG tablet Take 1,000 mcg by mouth daily.       No current facility-administered medications on file prior to visit.     Observations/Objective:   Vitals:   09/04/19 1313  Weight: 157 lb (71.2 kg)  Height: 5\' 1"  (1.549 m)   GEN:  The patient appears stated age and is in NAD.  Neurological examination:  Orientation: The patient is alert and  oriented x3. Cranial nerves: There is good facial symmetry with exception of chronic facial droop. The speech is fluent and clear. Soft palate rises symmetrically and there is no tongue deviation. Hearing is intact to conversational tone. Motor: Strength is at least antigravity x 4.   Shoulder shrug is equal and symmetric.  There is no pronator drift.  Movement examination: Tone: unable Abnormal movements: none  Coordination:  There is no decremation with RAM's, with any form of RAMS, including alternating supination and pronation of the forearm, hand opening and closing, finger taps Gait and Station: The patient has no difficulty arising out of a deep-seated chair without the use of the hands. The patient's stride length is good with decreased arm swing bilaterally.      Assessment and Plan:   1.  Vascular Parkinsonism.               -Continue carbidopa/levodopa 25/100, 3 tablets 3 times per day  -Feeling more off balance.  Strongly recommended home physical therapy given several falls.  She declined for now, but stated that she would think about it.  -Talked about the Covid vaccine.  She is resistant and right nowworries about long-term side effects, but I did encourage her to get it. 2.  Remote hx of seizure.  This is likely secondary to the initiation of wellbutrin.  She should remain off of this medication.  EEG in the hospital was normal.  She has been seizure-free for many years. 3.  Depression.              -on remeron (and lexapro).  Managed by PCP.             -No SI/HI             -Iast EKG was in 2017.  Discussed potential for prolonged QT with lexapro (albeit small).  Should check EKG.  Will get that scheduled at [facility]. 4.  Memory change             -is pseudodementia.  No evidence of true dementia 5.  RBD             -off xanax.  On klonopin.  Had an episode of falling out of bed since last visit.  On small dose of klonopin (0.25 mg bid - morning for anxiety).   Bedroom safety/rails discussed  6.  Headache             -on gabapentin  100 mg - 2 in the AM, 1 in the afternoon and evening.  her headaches seem to wax and wane.  Worse right now 7.  Near syncope  -The patient that she needs to start taking her  blood pressure, especially while standing.  Wonder if she still needs so much of the losartan, if at all.  Patient to follow-up with her primary care in that regard.  -Talked about increasing hydration.  Patient states "I do not like water."  I told her she needed to increase her water intake.  Follow Up Instructions:  5 months   -I discussed the assessment and treatment plan with the patient. The patient was provided an opportunity to ask questions and all were answered. The patient agreed with the plan and demonstrated an understanding of the instructions.   The patient was advised to call back or seek an in-person evaluation if the symptoms worsen or if the condition fails to improve as anticipated.    Total time spent on today's visit was 30 minutes, including both face-to-face time and nonface-to-face time.  Time included that spent on review of records (prior notes available to me/labs/imaging if pertinent), discussing treatment and goals, answering patient's questions and coordinating care.   Alonza Bogus, DO

## 2019-09-04 ENCOUNTER — Encounter: Payer: Self-pay | Admitting: Neurology

## 2019-09-04 ENCOUNTER — Other Ambulatory Visit: Payer: Self-pay

## 2019-09-04 ENCOUNTER — Ambulatory Visit: Payer: Medicare Other | Admitting: *Deleted

## 2019-09-04 DIAGNOSIS — M810 Age-related osteoporosis without current pathological fracture: Secondary | ICD-10-CM

## 2019-09-04 NOTE — Progress Notes (Signed)
Patient here today for Prolia 60mg /mL injection.  After review of the chart, the patient is due for her injection but there is no documentation of communication with the insurance to verify coverage. I spoke with my Nurse Supervisor Randall An, RN regarding my concern and she agreed that not contacting the insurance and giving her one of our supplied Prolia shots in office could result in an unnecessary large bill from her insurance. Larene Beach spoke with Leticia Penna and verified this being the correct action plan -- reschedule the patient and do the pre-cert process with the insurance - [place] agreed. Larene Beach spoke with the patient and relayed this information and advised that we would need to contact the insurance and reschedule her injection. The patient expressed understanding. Larene Beach is going to speak with Charmaine when she returns to work regarding this Solicitor.   Will send to [facility] as Juluis Rainier

## 2019-09-05 ENCOUNTER — Telehealth: Payer: Self-pay

## 2019-09-05 ENCOUNTER — Other Ambulatory Visit: Payer: Self-pay

## 2019-09-05 ENCOUNTER — Telehealth (INDEPENDENT_AMBULATORY_CARE_PROVIDER_SITE_OTHER): Payer: Medicare Other | Admitting: Neurology

## 2019-09-05 VITALS — Ht 61.0 in | Wt 157.0 lb

## 2019-09-05 DIAGNOSIS — R55 Syncope and collapse: Secondary | ICD-10-CM | POA: Diagnosis not present

## 2019-09-05 DIAGNOSIS — G4752 REM sleep behavior disorder: Secondary | ICD-10-CM

## 2019-09-05 DIAGNOSIS — G214 Vascular parkinsonism: Secondary | ICD-10-CM | POA: Diagnosis not present

## 2019-09-05 NOTE — Telephone Encounter (Signed)
Opened in error

## 2019-09-05 NOTE — Telephone Encounter (Signed)
Discussed Prolia benefits w/pt and daughter, Kennyth Lose.  Pt has [place].  Pt would owe approx $0.  Pt understands and agrees.

## 2019-09-11 ENCOUNTER — Other Ambulatory Visit: Payer: Self-pay

## 2019-09-11 ENCOUNTER — Ambulatory Visit (INDEPENDENT_AMBULATORY_CARE_PROVIDER_SITE_OTHER): Payer: Medicare Other | Admitting: *Deleted

## 2019-09-11 DIAGNOSIS — M810 Age-related osteoporosis without current pathological fracture: Secondary | ICD-10-CM | POA: Diagnosis not present

## 2019-09-11 MED ORDER — DENOSUMAB 60 MG/ML ~~LOC~~ SOSY
60.0000 mg | PREFILLED_SYRINGE | Freq: Once | SUBCUTANEOUS | Status: AC
  Administered 2019-09-11: 16:00:00 60 mg via SUBCUTANEOUS

## 2019-09-11 NOTE — Progress Notes (Signed)
Per orders of Alma Friendly, NP, injection of Prolia given by Virl Cagey. Patient tolerated injection well.

## 2019-09-12 ENCOUNTER — Ambulatory Visit: Payer: Medicare Other

## 2019-09-16 ENCOUNTER — Other Ambulatory Visit: Payer: Self-pay | Admitting: Neurology

## 2019-09-17 ENCOUNTER — Other Ambulatory Visit: Payer: Self-pay

## 2019-09-17 MED ORDER — CLONAZEPAM 0.5 MG PO TABS: ORAL_TABLET | ORAL | 1 refills | Status: DC

## 2019-09-17 NOTE — Telephone Encounter (Signed)
Received call from pharmacist at [facility] requesting a refill on Clonazepam because she accidentally deleted the rx. Gave a verbal; because Dr Tat had already agreed to refill rx. Pharmacist voiced understanding.

## 2019-09-29 ENCOUNTER — Other Ambulatory Visit: Payer: Self-pay | Admitting: Primary Care

## 2019-09-29 DIAGNOSIS — E782 Mixed hyperlipidemia: Secondary | ICD-10-CM

## 2019-10-24 ENCOUNTER — Other Ambulatory Visit: Payer: Self-pay | Admitting: Neurology

## 2019-11-22 ENCOUNTER — Other Ambulatory Visit: Payer: Self-pay | Admitting: Primary Care

## 2019-11-22 DIAGNOSIS — F341 Dysthymic disorder: Secondary | ICD-10-CM

## 2019-11-26 ENCOUNTER — Telehealth: Payer: Self-pay | Admitting: *Deleted

## 2019-11-26 ENCOUNTER — Other Ambulatory Visit: Payer: Self-pay | Admitting: Neurology

## 2019-11-26 DIAGNOSIS — F341 Dysthymic disorder: Secondary | ICD-10-CM

## 2019-11-26 NOTE — Telephone Encounter (Signed)
I did not deny her refill request of mirtazapine. [name], did you?  I also see where we gave her a 1 year supply in July 2020 so she should have enough for now. She is due for follow up in late May-early June, please schedule. She will need this appointment for further refills.

## 2019-11-26 NOTE — Telephone Encounter (Signed)
A message was left on voicemail wanting to know why patient's  Mirtazapine was denied. Patient requested a call back regarding this.

## 2019-11-26 NOTE — Telephone Encounter (Signed)
Spoken to [facility] and was told that it was last filled on 10/29/2019 so according to [facility], patient should have plenty.   I have called patient and daughter again. They stated that they are going to look for the other bottles, they are not sure where they misplaced them at. However, will call later today if they cannot find the bottles.

## 2019-11-26 NOTE — Telephone Encounter (Signed)
I have called patient and her daughter. They stated that they have about 30 tablets left. I have inform that would not be right and I will have [facility]. They stated that the bottle have 06/28/2019 date on it and it is 1 of 3.

## 2019-12-07 MED ORDER — MIRTAZAPINE 30 MG PO TABS: 30.0000 mg | ORAL_TABLET | Freq: Every day | ORAL | 0 refills | Status: DC

## 2019-12-07 NOTE — Addendum Note (Signed)
Addended by: Kris Mouton on: 12/07/2019 03:02 PM   Modules accepted: Orders

## 2019-12-07 NOTE — Telephone Encounter (Signed)
Spoke with Kennyth Lose, patient's daughter, they could not find patient's bottle of Mirtazapine. They are aware that they would have to pay out of pocket for the RX since insurance just covered this supply. RX sent in for 30 days and appointment made for follow up on 12/14/19

## 2019-12-12 ENCOUNTER — Telehealth: Payer: Self-pay | Admitting: Neurology

## 2019-12-12 ENCOUNTER — Ambulatory Visit (INDEPENDENT_AMBULATORY_CARE_PROVIDER_SITE_OTHER): Payer: Medicare Other

## 2019-12-12 DIAGNOSIS — Z0000 Encounter for general adult medical examination without abnormal findings: Secondary | ICD-10-CM

## 2019-12-12 DIAGNOSIS — R413 Other amnesia: Secondary | ICD-10-CM

## 2019-12-12 NOTE — Telephone Encounter (Signed)
Patient's daughter called in wanting advice. She said the patient has been getting constant bad headaches and stumbling around.

## 2019-12-12 NOTE — Progress Notes (Signed)
PCP notes:  Health Maintenance: No gaps noted   Abnormal Screenings: none   Patient concerns: Referral to audiologist   Nurse concerns: none   Next PCP appt.: 12/14/2019 @ 2:20 pm

## 2019-12-12 NOTE — Progress Notes (Addendum)
Subjective:   [name] is a 74 y.o. female who presents for Medicare Annual (Subsequent) preventive examination.  Review of Systems: N/A   I connected with the patient today by telephone and verified that I am speaking with the correct person using two identifiers. Location patient: home Location nurse: work Persons participating in the virtual visit: patient, Marine scientist.   I discussed the limitations, risks, security and privacy concerns of performing an evaluation and management service by telephone and the availability of in person appointments. I also discussed with the patient that there may be a patient responsible charge related to this service. The patient expressed understanding and verbally consented to this telephonic visit.    Interactive audio and video telecommunications were attempted between this nurse and patient, however failed, due to patient having technical difficulties OR patient did not have access to video capability.  We continued and completed visit with audio only.     Cardiac Risk Factors include: diabetes mellitus;advanced age (>86men, >40 women);hypertension;dyslipidemia     Objective:     Vitals: There were no vitals taken for this visit.  There is no height or weight on file to calculate BMI.  Advanced Directives 12/12/2019 09/04/2019 02/13/2019 12/14/2018 12/07/2017 12/06/2016 06/11/2016  Does Patient Have a Medical Advance Directive? Yes Yes Yes Yes Yes Yes Yes  Type of Paramedic of [place];Living will - Living will;Healthcare Power of [place];Living will [place];Living will [place];Living will [place];Living will;Out of facility DNR (pink MOST or yellow form)  Does patient want to make changes to medical advance directive? - - - - No - Patient declined - No - Patient declined  Copy of [place] in Chart? No - copy  requested - - Yes - validated most recent copy scanned in chart (See row information) Yes No - copy requested No - copy requested  Would patient like information on creating a medical advance directive? - - - - - - -  Pre-existing out of facility DNR order (yellow form or pink MOST form) - - - - - - Physician notified to receive inpatient order    Tobacco Social History   Tobacco Use  Smoking Status Never Smoker  Smokeless Tobacco Never Used     Counseling given: Not Answered   Clinical Intake:  Pre-visit preparation completed: Yes  Pain : 0-10 Pain Score: 7  Pain Type: Acute pain Pain Location: Head Pain Descriptors / Indicators: Aching Pain Onset: More than a month ago Pain Frequency: Intermittent     Nutritional Risks: None Diabetes: Yes CBG done?: No Did pt. bring in CBG monitor from home?: No  How often do you need to have someone help you when you read instructions, pamphlets, or other written materials from your doctor or pharmacy?: 1 - Never What is the last grade level you completed in school?: 12th  Interpreter Needed?: No  Information entered by :: CJohnson, LPN  Past Medical History:  Diagnosis Date  . Adjustment disorder with depressed mood   . Anxiety   . Bell's palsy    1985  . Dementia   . Depressive disorder, not elsewhere classified   . 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 DVT (deep venous thrombosis), left "early 2000's"   RLE  . Dysmetabolic syndrome X   . Essential hypertension, benign   . [id number]) 05/2011   "pretty often  since they put me on Parkinson's medicine"  . History of shingles    "as a teen and in my 31's"  . Mixed hyperlipidemia   . Morbid obesity ([place])   . Neurodegenerative gait disorder   . Personal history of thrombophlebitis   . Syncope and collapse 12/06/11   "this am; I pass out fairly often"  . Type II or unspecified type diabetes mellitus without mention of complication, not  stated as uncontrolled 12/06/11   "not anymore; had lap band OR"   Past Surgical History:  Procedure Laterality Date  . bladder tack  11/1995  . BREAST BIOPSY  05/20/2000   bilaterally  . BREAST CYST ASPIRATION  12/1998   bilaterally  . CARDIAC CATHETERIZATION  2004  . CATARACT EXTRACTION W/ INTRAOCULAR LENS  IMPLANT, BILATERAL    . FRACTURE SURGERY  02/09/2006   bilateral elbows  . FRACTURE SURGERY     right knee  . FRACTURE SURGERY     tib plateau  . KNEE ARTHROSCOPY  12/12/2002   right   . LAPAROSCOPIC GASTRIC BANDING  2008  . SEPTOPLASTY  08/1998   with antral window   . TONSILLECTOMY AND ADENOIDECTOMY  1970  . TUBAL LIGATION  1970's   Family History  Problem Relation Age of Onset  . Asthma Mother   . COPD Father   . Heart disease Father   . Diabetes Sister   . Vision loss Sister        legally blind  . Hypertension Child   . Hyperlipidemia Child   . Diabetes Child   . Diabetes Son   . Breast cancer Neg Hx    Social History   Socioeconomic History  . Marital status: Widowed    Spouse name: Not on file  . Number of children: 2  . Years of education: Not on file  . Highest education level: Some college, no degree  Occupational History  . 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 Smoking status: Never Smoker  . Smokeless tobacco: Never Used  Substance and Sexual Activity  . Alcohol use: No    Comment: none  . Drug use: No  . Sexual activity: Not Currently  Other Topics Concern  . Not on file  Social History Narrative   Right handed   One story home   Lives with daughter and granddaughter.   Daughter is HPOA.   DNR.   Social Determinants of Health   Financial Resource Strain: Low Risk   . Difficulty of Paying Living Expenses: Not hard at all  Food Insecurity: No Food Insecurity  . Worried About Charity fundraiser in the Last Year: Never true  . Ran Out of Food in the Last Year: Never true   Transportation Needs: No Transportation Needs  . Lack of Transportation (Medical): No  . Lack of Transportation (Non-Medical): No  Physical Activity: Inactive  . Days of Exercise per Week: 0 days  . Minutes of Exercise per Session: 0 min  Stress: No Stress Concern Present  . Feeling of Stress : Not at all  Social Connections:   . Frequency of Communication with Friends and Family:   . Frequency of Social Gatherings with Friends and Family:   . Attends Religious Services:   . Active Member of Clubs or Organizations:   . Attends Archivist Meetings:   Marland Kitchen Marital Status:     Outpatient Encounter Medications  as of 12/12/2019  Medication Sig  . acetaminophen (TYLENOL) 325 MG tablet Take 650 mg by mouth every 6 (six) hours as needed.  Marland Kitchen aspirin 81 MG tablet Take 81 mg by mouth daily.   Marland Kitchen Bioflavonoid Products (GRAPE SEED PO) Take 1 tablet by mouth daily.   . calcium carbonate (TUMS EX) 750 MG chewable tablet Chew 1 tablet by mouth as needed.   . Calcium Carbonate-Vitamin D (CALCIUM + D PO) Take 600 mg by mouth daily.  . carbidopa-levodopa (SINEMET IR) 25-100 MG tablet TAKE 3 TABLETS BY MOUTH 3 TIMES A DAY  . cetirizine (ZYRTEC) 10 MG tablet Take 10 mg by mouth daily.  . Cholecalciferol (VITAMIN D3) 1000 UNITS CAPS Take 1 capsule by mouth daily.    . clonazePAM (KLONOPIN) 0.5 MG tablet TAKE 1/2 TABLET BY MOUTH AT BEDTIME AND 1/2 TABLET DAILY AS NEEDED  . escitalopram (LEXAPRO) 10 MG tablet TAKE 1 TABLET BY MOUTH EVERY DAY  . feeding supplement (ENSURE COMPLETE) LIQD Take 237 mLs by mouth 2 (two) times daily between meals. (Patient taking differently: Take 237 mLs by mouth once. )  . gabapentin (NEURONTIN) 100 MG capsule TAKE 1 CAPSULE BY MOUTH THREE TIMES A DAY  . losartan-hydrochlorothiazide (HYZAAR) 50-12.5 MG tablet TAKE 1 TABLET BY MOUTH DAILY FOR BLOOD PRESSURE  . mirtazapine (REMERON) 30 MG tablet Take 1 tablet (30 mg total) by mouth at bedtime. For sleep.  . Multiple  Vitamins-Minerals (CENTRUM SILVER ADULT 50+) TABS Take by mouth.  . simvastatin (ZOCOR) 20 MG tablet TAKE 1 TABLET BY MOUTH EVERY DAY AT BEDTIME FOR CHOLESTEROL  . vitamin B-12 (CYANOCOBALAMIN) 1000 MCG tablet Take 1,000 mcg by mouth daily.     No facility-administered encounter medications on file as of 12/12/2019.    Activities of Daily Living In your present state of health, do you have any difficulty performing the following activities: 12/12/2019 12/14/2018  Hearing? Y Y  Comment has follow up with audiology -  Vision? N N  Difficulty concentrating or making decisions? Y Y  Comment sometimes -  Walking or climbing stairs? N N  Dressing or bathing? N N  Doing errands, shopping? N Y  Conservation officer, nature and eating ? N N  Using the Toilet? N N  In the past six months, have you accidently leaked urine? Tempie Donning  Comment wears a pad -  Do you have problems with loss of bowel control? N N  Managing your Medications? N N  Managing your Finances? N N  Housekeeping or managing your Housekeeping? N N  Some recent data might be hidden    Patient Care Team: Pleas Koch, NP as PCP - General (Internal Medicine) Tat, Eustace Quail, DO as Consulting Physician (Neurology)    Assessment:   This is a routine wellness examination for [place].  Exercise Activities and Dietary recommendations Current Exercise Habits: The patient does not participate in regular exercise at present, Exercise limited by: None identified  Goals    . Patient Stated     Starting 12/14/2018, I will continue to take medication as prescribed.     . Patient Stated     12/12/2019, I will maintain and continue medications as prescribed.        Fall Risk Fall Risk  12/12/2019 09/04/2019 02/13/2019 12/14/2018 08/10/2018  Falls in the past year? 1 1 0 0 0  Comment - - - - -  Number falls in past yr: 1 1 0 - 0  Injury with Fall? 0 0  0 - 0  Risk Factor Category  - - - - -  Risk for fall due to : Medication side effect - - - -    Follow up Falls evaluation completed;Falls prevention discussed - - - Falls evaluation completed   Is the patient's home free of loose throw rugs in walkways, pet beds, electrical cords, etc?   yes      Grab bars in the bathroom? yes      Handrails on the stairs?   yes      Adequate lighting?   yes  Timed Get Up and Go performed: N/A  Depression Screen PHQ 2/9 Scores 12/12/2019 12/14/2018 12/12/2017 12/07/2017  PHQ - 2 Score 6 3 1  0  PHQ- 9 Score 6 6 12  -     Cognitive Function MMSE - Mini Mental State Exam 12/12/2019 12/14/2018 12/07/2017 09/09/2017 12/06/2016  Not completed: Refused - - Refused -  Orientation to time - 5 4 - 5  Orientation to Place - 5 5 - 5  Registration - 3 3 - 3  Attention/ Calculation - 0 5 - 0  Recall - 2 2 - 3  Recall-comments - unable to recall 1 of 3 words  - - -  Language- name 2 objects - 0 2 - 0  Language- repeat - 1 1 - 1  Language- follow 3 step command - 0 3 - 3  Language- read & follow direction - 0 1 - 0  Write a sentence - 0 1 - 0  Copy design - 0 1 - 0  Total score - 16 28 - 20  Mini Cog  Mini-Cog screen was not completed. Patient refused.  Maximum score is 22. A value of 0 denotes this part of the MMSE was not completed or the patient failed this part of the Mini-Cog screening.  Montreal Cognitive Assessment  05/07/2014  Visuospatial/ Executive (0/5) 2  Naming (0/3) 2  Attention: Read list of digits (0/2) 1  Attention: Read list of letters (0/1) 1  Attention: Serial 7 subtraction starting at 100 (0/3) 3  Language: Repeat phrase (0/2) 2  Language : Fluency (0/1) 1  Abstraction (0/2) 2  Delayed Recall (0/5) 5  Orientation (0/6) 6  Total 25  Adjusted Score (based on education) 26      Immunization History  Administered Date(s) Administered  . Fluad Quad(high Dose 65+) 03/30/2019  . H1N1 08/29/2008  . Influenza Split 04/07/2012  . Influenza Whole 04/16/2010  . Influenza, High Dose Seasonal PF 05/12/2017  . Influenza,inj,Quad PF,6+  Mos 04/03/2013, 06/13/2014, 05/27/2015, 04/22/2016, 04/03/2018  . Pneumococcal Conjugate-13 11/26/2013  . Pneumococcal Polysaccharide-23 04/16/2010  . Td 08/29/2008  . Tdap 06/21/2012  . Zoster 04/18/2008    Qualifies for Shingles Vaccine: yes   Screening Tests Health Maintenance  Topic Date Due  . COVID-19 Vaccine (1) Never done  . FOOT EXAM  12/13/2018  . HEMOGLOBIN A1C  06/17/2019  . INFLUENZA VACCINE  02/24/2020  . OPHTHALMOLOGY EXAM  04/02/2020  . MAMMOGRAM  08/14/2020  . Fecal DNA (Cologuard)  12/15/2021  . TETANUS/TDAP  06/21/2022  . DEXA SCAN  Completed  . Hepatitis C Screening  Completed  . PNA vac Low Risk Adult  Completed    Cancer Screenings: Lung: Low Dose CT Chest recommended if Age 53-80 years, 30 pack-year currently smoking OR have quit w/in 15 years. Patient does not qualify. Breast:  Up to date on Mammogram: Yes, completed 08/15/2019   Up to date of Bone Density/Dexa: Yes,  completed 08/15/2019 Colorectal: Cologuard completed 12/16/2018  Additional Screenings:  Hepatitis C Screening: N/A     Plan:    Patient will maintain and continue medications as prescribed.    I have personally reviewed and noted the following in the patient's chart:   . Medical and social history . Use of alcohol, tobacco or illicit drugs  . Current medications and supplements . Functional ability and status . Nutritional status . Physical activity . Advanced directives . List of other physicians . Hospitalizations, surgeries, and ER visits in previous 12 months . Vitals . Screenings to include cognitive, depression, and falls . Referrals and appointments  In addition, I have reviewed and discussed with patient certain preventive protocols, quality metrics, and best practice recommendations. A written personalized care plan for preventive services as well as general preventive health recommendations were provided to patient.     Andrez Grime, LPN  [id number]

## 2019-12-12 NOTE — Patient Instructions (Signed)
[name] , Thank you for taking time to come for your Medicare Wellness Visit. I appreciate your ongoing commitment to your health goals. Please review the following plan we discussed and let me know if I can assist you in the future.   Screening recommendations/referrals: Colonoscopy: Cologuard completed 12/16/2018 Mammogram: Up to date, completed 08/15/2019 Bone Density: Up to date, completed 08/15/2019 Recommended yearly ophthalmology/optometry visit for glaucoma screening and checkup Recommended yearly dental visit for hygiene and checkup  Vaccinations: Influenza vaccine: Up to date, completed 03/30/2019 Pneumococcal vaccine: Completed series Tdap vaccine: Up to date, completed 06/21/2012 Shingles vaccine: discussed    Advanced directives: Please bring a copy of your POA (Power of [place]) and/or Living Will to your next appointment.  Conditions/risks identified: diabetes, hypertension, hyperlipidemia  Next appointment: 12/14/2019 @ 2:20 pm    Preventive Care 65 Years and Older, Female Preventive care refers to lifestyle choices and visits with your health care provider that can promote health and wellness. What does preventive care include?  A yearly physical exam. This is also called an annual well check.  Dental exams once or twice a year.  Routine eye exams. Ask your health care provider how often you should have your eyes checked.  Personal lifestyle choices, including:  Daily care of your teeth and gums.  Regular physical activity.  Eating a healthy diet.  Avoiding tobacco and drug use.  Limiting alcohol use.  Practicing safe sex.  Taking low-dose aspirin every day.  Taking vitamin and mineral supplements as recommended by your health care provider. What happens during an annual well check? The services and screenings done by your health care provider during your annual well check will depend on your age, overall health, lifestyle risk factors, and family  history of disease. Counseling  Your health care provider may ask you questions about your:  Alcohol use.  Tobacco use.  Drug use.  Emotional well-being.  Home and relationship well-being.  Sexual activity.  Eating habits.  History of falls.  Memory and ability to understand (cognition).  Work and work Statistician.  Reproductive health. Screening  You may have the following tests or measurements:  Height, weight, and BMI.  Blood pressure.  Lipid and cholesterol levels. These may be checked every 5 years, or more frequently if you are over 48 years old.  Skin check.  Lung cancer screening. You may have this screening every year starting at age 38 if you have a 30-pack-year history of smoking and currently smoke or have quit within the past 15 years.  Fecal occult blood test (FOBT) of the stool. You may have this test every year starting at age 37.  Flexible sigmoidoscopy or colonoscopy. You may have a sigmoidoscopy every 5 years or a colonoscopy every 10 years starting at age 18.  Hepatitis C blood test.  Hepatitis B blood test.  Sexually transmitted disease (STD) testing.  Diabetes screening. This is done by checking your blood sugar (glucose) after you have not eaten for a while (fasting). You may have this done every 1-3 years.  Bone density scan. This is done to screen for osteoporosis. You may have this done starting at age 12.  Mammogram. This may be done every 1-2 years. Talk to your health care provider about how often you should have regular mammograms. Talk with your health care provider about your test results, treatment options, and if necessary, the need for more tests. Vaccines  Your health care provider may recommend certain vaccines, such as:  Influenza  vaccine. This is recommended every year.  Tetanus, diphtheria, and acellular pertussis (Tdap, Td) vaccine. You may need a Td booster every 10 years.  Zoster vaccine. You may need this after  age 50.  Pneumococcal 13-valent conjugate (PCV13) vaccine. One dose is recommended after age 36.  Pneumococcal polysaccharide (PPSV23) vaccine. One dose is recommended after age 87. Talk to your health care provider about which screenings and vaccines you need and how often you need them. This information is not intended to replace advice given to you by your health care provider. Make sure you discuss any questions you have with your health care provider. Document Released: 08/08/2015 Document Revised: 03/31/2016 Document Reviewed: 05/13/2015 Elsevier Interactive Patient Education  2017 [place] Prevention in the Home Falls can cause injuries. They can happen to people of all ages. There are many things you can do to make your home safe and to help prevent falls. What can I do on the outside of my home?  Regularly fix the edges of walkways and driveways and fix any cracks.  Remove anything that might make you trip as you walk through a door, such as a raised step or threshold.  Trim any bushes or trees on the path to your home.  Use bright outdoor lighting.  Clear any walking paths of anything that might make someone trip, such as rocks or tools.  Regularly check to see if handrails are loose or broken. Make sure that both sides of any steps have handrails.  Any raised decks and porches should have guardrails on the edges.  Have any leaves, snow, or ice cleared regularly.  Use sand or salt on walking paths during winter.  Clean up any spills in your garage right away. This includes oil or grease spills. What can I do in the bathroom?  Use night lights.  Install grab bars by the toilet and in the tub and shower. Do not use towel bars as grab bars.  Use non-skid mats or decals in the tub or shower.  If you need to sit down in the shower, use a plastic, non-slip stool.  Keep the floor dry. Clean up any water that spills on the floor as soon as it  happens.  Remove soap buildup in the tub or shower regularly.  Attach bath mats securely with double-sided non-slip rug tape.  Do not have throw rugs and other things on the floor that can make you trip. What can I do in the bedroom?  Use night lights.  Make sure that you have a light by your bed that is easy to reach.  Do not use any sheets or blankets that are too big for your bed. They should not hang down onto the floor.  Have a firm chair that has side arms. You can use this for support while you get dressed.  Do not have throw rugs and other things on the floor that can make you trip. What can I do in the kitchen?  Clean up any spills right away.  Avoid walking on wet floors.  Keep items that you use a lot in easy-to-reach places.  If you need to reach something above you, use a strong step stool that has a grab bar.  Keep electrical cords out of the way.  Do not use floor polish or wax that makes floors slippery. If you must use wax, use non-skid floor wax.  Do not have throw rugs and other things on the floor that can  make you trip. What can I do with my stairs?  Do not leave any items on the stairs.  Make sure that there are handrails on both sides of the stairs and use them. Fix handrails that are broken or loose. Make sure that handrails are as long as the stairways.  Check any carpeting to make sure that it is firmly attached to the stairs. Fix any carpet that is loose or worn.  Avoid having throw rugs at the top or bottom of the stairs. If you do have throw rugs, attach them to the floor with carpet tape.  Make sure that you have a light switch at the top of the stairs and the bottom of the stairs. If you do not have them, ask someone to add them for you. What else can I do to help prevent falls?  Wear shoes that:  Do not have high heels.  Have rubber bottoms.  Are comfortable and fit you well.  Are closed at the toe. Do not wear sandals.  If you  use a stepladder:  Make sure that it is fully opened. Do not climb a closed stepladder.  Make sure that both sides of the stepladder are locked into place.  Ask someone to hold it for you, if possible.  Clearly mark and make sure that you can see:  Any grab bars or handrails.  First and last steps.  Where the edge of each step is.  Use tools that help you move around (mobility aids) if they are needed. These include:  Canes.  Walkers.  Scooters.  Crutches.  Turn on the lights when you go into a dark area. Replace any light bulbs as soon as they burn out.  Set up your furniture so you have a clear path. Avoid moving your furniture around.  If any of your floors are uneven, fix them.  If there are any pets around you, be aware of where they are.  Review your medicines with your doctor. Some medicines can make you feel dizzy. This can increase your chance of falling. Ask your doctor what other things that you can do to help prevent falls. This information is not intended to replace advice given to you by your health care provider. Make sure you discuss any questions you have with your health care provider. Document Released: 05/08/2009 Document Revised: 12/18/2015 Document Reviewed: 08/16/2014 Elsevier Interactive Patient Education  2017 [employer].

## 2019-12-13 NOTE — Telephone Encounter (Signed)
She used to see Dr. Tommi Rumps at [facility] clinic.  Maybe she needs to make another appt? We can schedule her for neurocogn testing if she wants.  She has had this in the past (remote past)

## 2019-12-13 NOTE — Telephone Encounter (Signed)
Spoke with patients daughter and she states her mom is having bad headaches and having some issues with balance. She states her mom has an appt in July and wants to know if her mom needs an appt sooner?

## 2019-12-13 NOTE — Telephone Encounter (Signed)
Spoke with patients daughter and gave her the headache wellness center phone number. She states she will call and make an appt. She is interested in doing the neurocog testing.

## 2019-12-13 NOTE — Telephone Encounter (Signed)
Place referral

## 2019-12-14 ENCOUNTER — Encounter: Payer: Self-pay | Admitting: Primary Care

## 2019-12-14 ENCOUNTER — Other Ambulatory Visit: Payer: Self-pay

## 2019-12-14 ENCOUNTER — Encounter: Payer: Self-pay | Admitting: Counselor

## 2019-12-14 ENCOUNTER — Ambulatory Visit (INDEPENDENT_AMBULATORY_CARE_PROVIDER_SITE_OTHER): Payer: Medicare Other | Admitting: Primary Care

## 2019-12-14 VITALS — BP 116/76 | HR 83 | Temp 96.1°F | Ht 61.0 in | Wt 144.0 lb

## 2019-12-14 DIAGNOSIS — Z0000 Encounter for general adult medical examination without abnormal findings: Secondary | ICD-10-CM

## 2019-12-14 DIAGNOSIS — I10 Essential (primary) hypertension: Secondary | ICD-10-CM

## 2019-12-14 DIAGNOSIS — G214 Vascular parkinsonism: Secondary | ICD-10-CM | POA: Insufficient documentation

## 2019-12-14 DIAGNOSIS — E119 Type 2 diabetes mellitus without complications: Secondary | ICD-10-CM | POA: Diagnosis not present

## 2019-12-14 DIAGNOSIS — Z23 Encounter for immunization: Secondary | ICD-10-CM | POA: Diagnosis not present

## 2019-12-14 DIAGNOSIS — F341 Dysthymic disorder: Secondary | ICD-10-CM

## 2019-12-14 DIAGNOSIS — E782 Mixed hyperlipidemia: Secondary | ICD-10-CM | POA: Diagnosis not present

## 2019-12-14 DIAGNOSIS — M810 Age-related osteoporosis without current pathological fracture: Secondary | ICD-10-CM

## 2019-12-14 HISTORY — DX: Vascular parkinsonism: G21.4

## 2019-12-14 MED ORDER — ZOSTER VAC RECOMB ADJUVANTED 50 MCG/0.5ML IM SUSR: 0.5000 mL | Freq: Once | INTRAMUSCULAR | 1 refills | Status: AC

## 2019-12-14 NOTE — Assessment & Plan Note (Signed)
Rx for Shingrix vaccine provided. Other immunizations UTD. Colon cancer screening UTD. Mammogram UTD. Encouraged a healthy diet and regular exercise. Exam today stable. Labs pending.

## 2019-12-14 NOTE — Assessment & Plan Note (Signed)
Noted on bone density scan from January 2021.  Managed on Prolia injections. Repeat calcium pending.

## 2019-12-14 NOTE — Assessment & Plan Note (Signed)
Well controlled in the office today, continue losartan-HCTZ. CMP pending.

## 2019-12-14 NOTE — Assessment & Plan Note (Signed)
Repeat A1C pending. Encouraged a healthy diet and regular exercise.  Not managed on medication.

## 2019-12-14 NOTE — Patient Instructions (Signed)
Stop by the lab prior to leaving today. I will notify you of your results once received.   Take the shingles vaccine prescription to the pharmacy.  Start exercising. You should be getting 150 minutes of exercise weekly.  Work on a healthy diet. Ensure you are consuming 64 ounces of water daily.  It was a pleasure to see you today!

## 2019-12-14 NOTE — Progress Notes (Signed)
Subjective:    Patient ID: [name], female    DOB: [birth year], 74 y.o.   MRN: [id number]  HPI  This visit occurred during the SARS-CoV-2 public health emergency.  Safety protocols were in place, including screening questions prior to the visit, additional usage of staff PPE, and extensive cleaning of exam room while observing appropriate contact time as indicated for disinfecting solutions.   [name] is a 74 year old female with a history of hypertension, GERD, type 2 diabetes, recurrent UTI, hyperlipidemia, osteoporosis who presents today for complete physical.  Immunizations: -Tetanus: Completed in 2013 -Influenza: Completed last season  -Shingles: Completed Zostavax in 2009 -Pneumonia: Completed Prevnar and Pneumovax -Covid-19: Completed series  Diet: She endorses a healthy diet. She is mostly eating soup with little meat.  Exercise: She is not exercising.   Eye exam: Due in Summer 2021 Dental exam: Completes semi-annually   Mammogram: Completed in 2021 Dexa: Completed in 2021. Completed Prolia injection in February 2021. Colonoscopy: Completed Cologuard in 2020, negative Hep C Screen: Negative  BP Readings from Last 3 Encounters:  12/14/19 116/76  02/14/19 (!) 188/85  02/13/19 (!) 115/58     Review of Systems  Constitutional: Negative for unexpected weight change.  HENT: Negative for rhinorrhea.   Respiratory: Negative for cough and shortness of breath.   Cardiovascular: Negative for chest pain.  Gastrointestinal: Negative for constipation and diarrhea.  Genitourinary: Negative for difficulty urinating.  Musculoskeletal: Positive for arthralgias. Negative for myalgias.  Skin: Negative for rash.  Allergic/Immunologic: Negative for environmental allergies.  Neurological: Positive for headaches. Negative for dizziness.       Altered gait, more stumbling with ambulation   Psychiatric/Behavioral: Negative for sleep disturbance.       Past Medical  History:  Diagnosis Date  . Adjustment disorder with depressed mood   . Anxiety   . Bell's palsy    1985  . Dementia   . Depressive disorder, not elsewhere classified   . 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 DVT (deep venous thrombosis), left "early 2000's"   RLE  . Dysmetabolic syndrome X   . Essential hypertension, benign   . [id number]) 05/2011   "pretty often since they put me on Parkinson's medicine"  . History of shingles    "as a teen and in my 73's"  . Mixed hyperlipidemia   . Morbid obesity ([place])   . Neurodegenerative gait disorder   . Personal history of thrombophlebitis   . Syncope and collapse 12/06/11   "this am; I pass out fairly often"  . Type II or unspecified type diabetes mellitus without mention of complication, not stated as uncontrolled 12/06/11   "not anymore; had lap band OR"     Social History   Socioeconomic History  . Marital status: Widowed    Spouse name: Not on file  . Number of children: 2  . Years of education: Not on file  . Highest education level: Some college, no degree  Occupational History  . 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 Smoking status: Never Smoker  . Smokeless tobacco: Never Used  Substance and Sexual Activity  . Alcohol use: No    Comment: none  . Drug use: No  . Sexual activity: Not Currently  Other Topics Concern  . Not on file  Social History Narrative   Right handed   One  story home   Lives with daughter and granddaughter.   Daughter is HPOA.   DNR.   Social Determinants of Health   Financial Resource Strain: Low Risk   . Difficulty of Paying Living Expenses: Not hard at all  Food Insecurity: No Food Insecurity  . Worried About Charity fundraiser in the Last Year: Never true  . Ran Out of Food in the Last Year: Never true  Transportation Needs: No Transportation Needs  . Lack of Transportation (Medical): No   . Lack of Transportation (Non-Medical): No  Physical Activity: Inactive  . Days of Exercise per Week: 0 days  . Minutes of Exercise per Session: 0 min  Stress: No Stress Concern Present  . Feeling of Stress : Not at all  Social Connections:   . Frequency of Communication with Friends and Family:   . Frequency of Social Gatherings with Friends and Family:   . Attends Religious Services:   . Active Member of Clubs or Organizations:   . Attends Archivist Meetings:   Marland Kitchen Marital Status:   Intimate Partner Violence: Not At Risk  . Fear of Current or Ex-Partner: No  . Emotionally Abused: No  . Physically Abused: No  . Sexually Abused: No    Past Surgical History:  Procedure Laterality Date  . bladder tack  11/1995  . BREAST BIOPSY  05/20/2000   bilaterally  . BREAST CYST ASPIRATION  12/1998   bilaterally  . CARDIAC CATHETERIZATION  2004  . CATARACT EXTRACTION W/ INTRAOCULAR LENS  IMPLANT, BILATERAL    . FRACTURE SURGERY  02/09/2006   bilateral elbows  . FRACTURE SURGERY     right knee  . FRACTURE SURGERY     tib plateau  . KNEE ARTHROSCOPY  12/12/2002   right   . LAPAROSCOPIC GASTRIC BANDING  2008  . SEPTOPLASTY  08/1998   with antral window   . TONSILLECTOMY AND ADENOIDECTOMY  1970  . TUBAL LIGATION  1970's    Family History  Problem Relation Age of Onset  . Asthma Mother   . COPD Father   . Heart disease Father   . Diabetes Sister   . Vision loss Sister        legally blind  . Hypertension Child   . Hyperlipidemia Child   . Diabetes Child   . Diabetes Son   . Breast cancer Neg Hx     Allergies  Allergen Reactions  . Codeine Sulfate     REACTION: hallucinations  . Penicillins Anaphylaxis, Swelling and Rash  . Wellbutrin [Bupropion] Other (See Comments)    Seizures and hallucinations    Current Outpatient Medications on File Prior to Visit  Medication Sig Dispense Refill  . acetaminophen (TYLENOL) 325 MG tablet Take 650 mg by mouth every 6  (six) hours as needed.    Marland Kitchen aspirin 81 MG tablet Take 81 mg by mouth daily.     Marland Kitchen Bioflavonoid Products (GRAPE SEED PO) Take 1 tablet by mouth daily.     . calcium carbonate (TUMS EX) 750 MG chewable tablet Chew 1 tablet by mouth as needed.     . Calcium Carbonate-Vitamin D (CALCIUM + D PO) Take 600 mg by mouth daily.    . carbidopa-levodopa (SINEMET IR) 25-100 MG tablet TAKE 3 TABLETS BY MOUTH 3 TIMES A DAY 810 tablet 1  . cetirizine (ZYRTEC) 10 MG tablet Take 10 mg by mouth daily.    . Cholecalciferol (VITAMIN D3) 1000 UNITS CAPS Take 1 capsule  by mouth daily.      . clonazePAM (KLONOPIN) 0.5 MG tablet TAKE 1/2 TABLET BY MOUTH AT BEDTIME AND 1/2 TABLET DAILY AS NEEDED 90 tablet 1  . escitalopram (LEXAPRO) 10 MG tablet TAKE 1 TABLET BY MOUTH EVERY DAY 90 tablet 2  . feeding supplement (ENSURE COMPLETE) LIQD Take 237 mLs by mouth 2 (two) times daily between meals. (Patient taking differently: Take 237 mLs by mouth once. ) 1 Bottle 11  . gabapentin (NEURONTIN) 100 MG capsule TAKE 1 CAPSULE BY MOUTH THREE TIMES A DAY 270 capsule 1  . losartan-hydrochlorothiazide (HYZAAR) 50-12.5 MG tablet TAKE 1 TABLET BY MOUTH DAILY FOR BLOOD PRESSURE 90 tablet 1  . mirtazapine (REMERON) 30 MG tablet Take 1 tablet (30 mg total) by mouth at bedtime. For sleep. 30 tablet 0  . Multiple Vitamins-Minerals (CENTRUM SILVER ADULT 50+) TABS Take by mouth.    . simvastatin (ZOCOR) 20 MG tablet TAKE 1 TABLET BY MOUTH EVERY DAY AT BEDTIME FOR CHOLESTEROL 90 tablet 0  . vitamin B-12 (CYANOCOBALAMIN) 1000 MCG tablet Take 1,000 mcg by mouth daily.       No current facility-administered medications on file prior to visit.    BP 116/76   Pulse 83   Temp (!) 96.1 F (35.6 C) (Temporal)   Ht 5\' 1"  (1.549 m)   Wt 144 lb (65.3 kg)   SpO2 98%   BMI 27.21 kg/m    Objective:   Physical Exam  Constitutional: She is oriented to person, place, and time. She appears well-nourished.  HENT:  Right Ear: Tympanic membrane and ear  canal normal.  Left Ear: Tympanic membrane and ear canal normal.  Mouth/Throat: Oropharynx is clear and moist.  Eyes: Pupils are equal, round, and reactive to light. EOM are normal.  Cardiovascular: Normal rate and regular rhythm.  Respiratory: Effort normal and breath sounds normal.  GI: Soft. Bowel sounds are normal. There is no abdominal tenderness.  Musculoskeletal:        General: Normal range of motion.     Cervical back: Neck supple.  Neurological: She is alert and oriented to person, place, and time. No cranial nerve deficit.  Reflex Scores:      Patellar reflexes are 2+ on the right side and 2+ on the left side. Skin: Skin is warm and dry.  Psychiatric: She has a normal mood and affect.           Assessment & Plan:

## 2019-12-14 NOTE — Telephone Encounter (Signed)
Orders placed, patients daughter has been made aware. [place] office staff notified to contact patient.

## 2019-12-14 NOTE — Assessment & Plan Note (Signed)
Following with neurology with upcoming appointment soon. Continue current regimen.

## 2019-12-14 NOTE — Assessment & Plan Note (Signed)
Compliant to simvastatin, repeat lipids pending.  °

## 2019-12-14 NOTE — Assessment & Plan Note (Signed)
Following with neurology, compliant to Lexapro, Clonazepam, and Mirtazapine.   Denies concerns for depression/anxiety today.

## 2019-12-15 LAB — LIPID PANEL
Cholesterol: 162 mg/dL (ref <200)
HDL: 53 mg/dL (ref >=50)
LDL Cholesterol (Calc): 72 mg/dL (calc)
Non-HDL Cholesterol (Calc): 109 mg/dL (calc) (ref <130)
Total CHOL/HDL Ratio: 3.1 (calc) (ref <5.0)
Triglycerides: 306 mg/dL — ABNORMAL HIGH (ref <150)

## 2019-12-15 LAB — COMPREHENSIVE METABOLIC PANEL
AG Ratio: 1.8 (calc) (ref 1.0–2.5)
ALT: 3 U/L — ABNORMAL LOW (ref 6–29)
AST: 10 U/L (ref 10–35)
Albumin: 3.9 g/dL (ref 3.6–5.1)
Alkaline phosphatase (APISO): 55 U/L (ref 37–153)
BUN: 19 mg/dL (ref 7–25)
CO2: 28 mmol/L (ref 20–32)
Calcium: 10.2 mg/dL (ref 8.6–10.4)
Chloride: 100 mmol/L (ref 98–110)
Creat: 0.71 mg/dL (ref 0.60–0.93)
Globulin: 2.2 g/dL (calc) (ref 1.9–3.7)
Glucose, Bld: 125 mg/dL — ABNORMAL HIGH (ref 65–99)
Potassium: 4.6 mmol/L (ref 3.5–5.3)
Sodium: 140 mmol/L (ref 135–146)
Total Bilirubin: 0.5 mg/dL (ref 0.2–1.2)
Total Protein: 6.1 g/dL (ref 6.1–8.1)

## 2019-12-15 LAB — CBC
HCT: 41 % (ref 35.0–45.0)
Hemoglobin: 13.6 g/dL (ref 11.7–15.5)
MCH: 31.9 pg (ref 27.0–33.0)
MCHC: 33.2 g/dL (ref 32.0–36.0)
MCV: 96.2 fL (ref 80.0–100.0)
MPV: 11.1 fL (ref 7.5–12.5)
Platelets: 257 10*3/uL (ref 140–400)
RBC: 4.26 10*6/uL (ref 3.80–5.10)
RDW: 12.7 % (ref 11.0–15.0)
WBC: 9.7 10*3/uL (ref 3.8–10.8)

## 2019-12-15 LAB — HEMOGLOBIN A1C
Hgb A1c MFr Bld: 6.5 % of total Hgb — ABNORMAL HIGH (ref <5.7)
Mean Plasma Glucose: 140 (calc)
eAG (mmol/L): 7.7 (calc)

## 2019-12-17 ENCOUNTER — Other Ambulatory Visit: Payer: Self-pay | Admitting: Neurology

## 2019-12-30 ENCOUNTER — Other Ambulatory Visit: Payer: Self-pay | Admitting: Primary Care

## 2019-12-30 DIAGNOSIS — E782 Mixed hyperlipidemia: Secondary | ICD-10-CM

## 2019-12-31 NOTE — Telephone Encounter (Signed)
Message left for patient to return my call.  

## 2020-01-02 NOTE — Telephone Encounter (Signed)
Noted, prescription changed to rosuvastatin.

## 2020-01-02 NOTE — Telephone Encounter (Signed)
Spoken to patient's daughter. She stated that they forgot to check patient's results.  Notified patient's daughter of Tawni Millers comments.   Patient was not fasting and okay to change to rosuvastatin

## 2020-01-14 ENCOUNTER — Encounter: Payer: Medicare Other | Admitting: Counselor

## 2020-01-16 ENCOUNTER — Telehealth: Payer: Self-pay

## 2020-01-16 DIAGNOSIS — F341 Dysthymic disorder: Secondary | ICD-10-CM

## 2020-01-16 MED ORDER — MIRTAZAPINE 30 MG PO TABS: 30.0000 mg | ORAL_TABLET | Freq: Every day | ORAL | 1 refills | Status: DC

## 2020-01-16 NOTE — Telephone Encounter (Signed)
[name] (DPR signed) left v/m requesting status of refill request by [facility] for Mirtazapine and request cb why not refilled. I do not see refill request for Mirtazapine per chart review tab; did paper fax come in from [place] requesting refill Mirtazapine. [name] request cb.

## 2020-01-16 NOTE — Telephone Encounter (Signed)
I did not get the request but will sent as requested. Per Allie Bossier last note, patient continue  to take.  Kennyth Lose have been notified

## 2020-01-21 ENCOUNTER — Encounter: Payer: Medicare Other | Admitting: Counselor

## 2020-01-25 ENCOUNTER — Telehealth: Payer: Self-pay

## 2020-01-25 DIAGNOSIS — M810 Age-related osteoporosis without current pathological fracture: Secondary | ICD-10-CM

## 2020-01-25 NOTE — Telephone Encounter (Signed)
Last Prolia injection 09-11-19.  Pt has [employer] MCR and CHAMPVA, no PA.  Pt owes approximately $0.

## 2020-01-31 ENCOUNTER — Other Ambulatory Visit: Payer: Self-pay | Admitting: Primary Care

## 2020-02-04 NOTE — Progress Notes (Signed)
Assessment/Plan:   1.  Vascular parkinsonism  -Continue carbidopa/levodopa 25/100, 3 tablets 3 times per day  -really thinks that she needs PT.  She declines it.   2.  Depression  -Managed by primary care.  On Lexapro and mirtazapine. 3.  RBD and GAD  -On clonazepam, 0.5 mg, half tablet at bedtime and takes half tablet as needed in the day for anxiety.  I have concerns for this causing falls and EDS.    -get tuck in bed rails for bed.   4.  Headache  -On gabapentin, 100 mg, and NP lowered to 1 po tid from 2/1/1  -pt to make appointment with Dr. Tommi Rumps 5.  History near syncope  -improved.  Is on low dose losartan.   6.  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  Has neurocog testing with Dr. Nicole Kindred on 7/26   Subjective:   [name] was seen today in follow up for vascular parkinsonism.  My previous records were reviewed prior to todays visit as well as outside records available to me. Pt had few falls since our last visit - with one she fell out of the bed while asleep.  With one she stumbled over something and fell down.  Pt denies lightheadedness, near syncope.  No hallucinations.  Last saw primary NP on May 21.  Daughter states that gabapentin decreased to 1 po tid due to EDS.  She is on klonopin 1/2 po bid.    Current prescribed movement disorder medications: Carbidopa/levodopa 25/100, 3 tablets 3 times per day Mirtazapine, 30 mg at night Gabapentin, 100 mg, 2 tablets in the morning, 1 in the afternoon, 1 in the evening (daughter states that doing 1 po tid) Clonazepam, 0.25 mg twice daily   ALLERGIES:   Allergies  Allergen Reactions  . Codeine Sulfate     REACTION: hallucinations  . Penicillins Anaphylaxis, Swelling and Rash  . Wellbutrin [Bupropion] Other (See Comments)    Seizures and hallucinations    CURRENT MEDICATIONS:  Outpatient Encounter Medications as of 02/07/2020  Medication Sig  . aspirin 81 MG tablet Take 81 mg by mouth daily.   Marland Kitchen  Bioflavonoid Products (GRAPE SEED PO) Take 1 tablet by mouth daily.   . calcium carbonate (TUMS EX) 750 MG chewable tablet Chew 1 tablet by mouth as needed.   . Calcium Carbonate-Vitamin D (CALCIUM + D PO) Take 600 mg by mouth daily.  . carbidopa-levodopa (SINEMET IR) 25-100 MG tablet TAKE 3 TABLETS BY MOUTH 3 TIMES A DAY  . cetirizine (ZYRTEC) 10 MG tablet Take 10 mg by mouth daily.  . Cholecalciferol (VITAMIN D3) 1000 UNITS CAPS Take 1 capsule by mouth daily.    . clonazePAM (KLONOPIN) 0.5 MG tablet TAKE 1/2 TABLET BY MOUTH AT BEDTIME AND 1/2 TABLET DAILY AS NEEDED  . escitalopram (LEXAPRO) 10 MG tablet TAKE 1 TABLET BY MOUTH EVERY DAY  . feeding supplement (ENSURE COMPLETE) LIQD Take 237 mLs by mouth 2 (two) times daily between meals. (Patient taking differently: Take 237 mLs by mouth once. )  . gabapentin (NEURONTIN) 100 MG capsule TAKE 1 CAPSULE BY MOUTH THREE TIMES A DAY  . losartan-hydrochlorothiazide (HYZAAR) 50-12.5 MG tablet TAKE 1 TABLET BY MOUTH EVERY DAY FOR BLOOD PRESSURE  . mirtazapine (REMERON) 30 MG tablet Take 1 tablet (30 mg total) by mouth at bedtime. For sleep.  . Multiple Vitamins-Minerals (CENTRUM SILVER ADULT 50+) TABS Take by mouth.  . naproxen sodium (ALEVE) 220 MG tablet Take 220 mg by  mouth in the morning and at bedtime.  . rosuvastatin (CRESTOR) 5 MG tablet Take 1 tablet (5 mg total) by mouth every evening. For cholesterol.  . vitamin B-12 (CYANOCOBALAMIN) 1000 MCG tablet Take 1,000 mcg by mouth daily.    . [DISCONTINUED] acetaminophen (TYLENOL) 325 MG tablet Take 650 mg by mouth every 6 (six) hours as needed. (Patient not taking: Reported on 02/07/2020)  . [DISCONTINUED] simvastatin (ZOCOR) 20 MG tablet TAKE 1 TABLET BY MOUTH EVERY DAY AT BEDTIME FOR CHOLESTEROL   No facility-administered encounter medications on file as of 02/07/2020.    Objective:   PHYSICAL EXAMINATION:    VITALS:   Vitals:   02/07/20 0813  BP: 116/80  Pulse: 74  SpO2: 97%  Weight: 164  lb (74.4 kg)  Height: 5\' 1"  (1.549 m)    GEN:  The patient appears stated age and is in NAD. HEENT:  Normocephalic, atraumatic.  The mucous membranes are moist. The superficial temporal arteries are without ropiness or tenderness. CV:  RRR Lungs:  CTAB Neck/HEME:  There are no carotid bruits bilaterally.  Neurological examination:  Orientation: The patient is alert and oriented x3. Cranial nerves: The speech is fluent and clear. Soft palate rises symmetrically and there is no tongue deviation. Hearing is intact to conversational tone. Sensation: Sensation is intact to light touch throughout Motor: Strength is at least antigravity x4.  Movement examination: Tone: There is normal tone in the UE/LE Abnormal movements: none Coordination:  There is min decremation with RAM's, mostly on the R.  She is more slow than has Patent examiner and Station: The patient has minimal difficulty arising out of a deep-seated chair without the use of the hands. The patient's stride length is slightly decreased, cautious and has decreased arm swing bilaterally.    I have reviewed and interpreted the following labs independently    Chemistry      Component Value Date/Time   NA 140 12/14/2019 1504   K 4.6 12/14/2019 1504   CL 100 12/14/2019 1504   CO2 28 12/14/2019 1504   BUN 19 12/14/2019 1504   CREATININE 0.71 12/14/2019 1504      Component Value Date/Time   CALCIUM 10.2 12/14/2019 1504   ALKPHOS 61 12/15/2018 0900   AST 10 12/14/2019 1504   ALT 3 (L) 12/14/2019 1504   BILITOT 0.5 12/14/2019 1504       Lab Results  Component Value Date   WBC 9.7 12/14/2019   HGB 13.6 12/14/2019   HCT 41.0 12/14/2019   MCV 96.2 12/14/2019   PLT 257 12/14/2019    Lab Results  Component Value Date   TSH 1.04 12/07/2017     Total time spent on today's visit was 30 minutes, including both face-to-face time and nonface-to-face time.  Time included that spent on review of records (prior notes  available to me/labs/imaging if pertinent), discussing treatment and goals, answering patient's questions and coordinating care.  Cc:  Pleas Koch, NP

## 2020-02-07 ENCOUNTER — Other Ambulatory Visit: Payer: Self-pay

## 2020-02-07 ENCOUNTER — Ambulatory Visit (INDEPENDENT_AMBULATORY_CARE_PROVIDER_SITE_OTHER): Payer: Medicare Other | Admitting: Neurology

## 2020-02-07 ENCOUNTER — Encounter: Payer: Self-pay | Admitting: Neurology

## 2020-02-07 VITALS — BP 116/80 | HR 74 | Ht 61.0 in | Wt 164.0 lb

## 2020-02-07 DIAGNOSIS — R519 Headache, unspecified: Secondary | ICD-10-CM | POA: Diagnosis not present

## 2020-02-07 DIAGNOSIS — G4752 REM sleep behavior disorder: Secondary | ICD-10-CM

## 2020-02-07 DIAGNOSIS — G214 Vascular parkinsonism: Secondary | ICD-10-CM | POA: Diagnosis not present

## 2020-02-07 DIAGNOSIS — Z5181 Encounter for therapeutic drug level monitoring: Secondary | ICD-10-CM

## 2020-02-07 NOTE — Patient Instructions (Signed)
1.  I really would like to see you do PT 2.  Buy the portable tuck in bed rails for the beds 3.  Make an appointment with Dr. Tommi Rumps regarding headache  The physicians and staff at [facility] are committed to providing excellent care. You may receive a survey requesting feedback about your experience at our office. We strive to receive "very good" responses to the survey questions. If you feel that your experience would prevent you from giving the office a "very good " response, please contact our office to try to remedy the situation. We may be reached at [phone]. Thank you for taking the time out of your busy day to complete the survey.

## 2020-02-12 ENCOUNTER — Other Ambulatory Visit: Payer: Self-pay | Admitting: Primary Care

## 2020-02-12 DIAGNOSIS — E782 Mixed hyperlipidemia: Secondary | ICD-10-CM

## 2020-02-12 NOTE — Telephone Encounter (Signed)
Contacted pt's daughter, Jethro Bastos, and advised pt due for Prolia after 8/17. Scheduled lab for 8/3 and prolia inj on 8/18.

## 2020-02-18 ENCOUNTER — Ambulatory Visit (INDEPENDENT_AMBULATORY_CARE_PROVIDER_SITE_OTHER): Payer: Medicare Other | Admitting: Counselor

## 2020-02-18 ENCOUNTER — Ambulatory Visit: Payer: Medicare Other

## 2020-02-18 ENCOUNTER — Encounter: Payer: Self-pay | Admitting: Counselor

## 2020-02-18 ENCOUNTER — Other Ambulatory Visit: Payer: Self-pay

## 2020-02-18 DIAGNOSIS — G3184 Mild cognitive impairment, so stated: Secondary | ICD-10-CM

## 2020-02-18 DIAGNOSIS — G214 Vascular parkinsonism: Secondary | ICD-10-CM

## 2020-02-18 DIAGNOSIS — F09 Unspecified mental disorder due to known physiological condition: Secondary | ICD-10-CM

## 2020-02-18 DIAGNOSIS — F418 Other specified anxiety disorders: Secondary | ICD-10-CM | POA: Diagnosis not present

## 2020-02-18 HISTORY — DX: Mild cognitive impairment of uncertain or unknown etiology: G31.84

## 2020-02-18 NOTE — Progress Notes (Signed)
   Psychometrist Note   Cognitive testing was administered to [name] by Lamar Benes, B.S. (Technician) under the supervision of Alphonzo Severance, Psy.D., ABN. [name] was able to tolerate all test procedures. Dr. Nicole Kindred met with the patient as needed to manage any emotional reactions to the testing procedures. Rest breaks were offered.    The battery of tests administered was selected by Dr. Nicole Kindred with consideration to the patient's current level of functioning, the nature of her symptoms, emotional and behavioral responses during the interview, level of literacy, observed level of motivation/effort, and the nature of the referral question. This battery was communicated to the psychometrist. Communication between Dr. Nicole Kindred and the psychometrist was ongoing throughout the evaluation and Dr. Nicole Kindred was immediately accessible at all times. Dr. Nicole Kindred provided supervision to the technician on the date of this service, to the extent necessary to assure the quality of all services provided.    [name] will return in approximately one week for an interactive feedback session with Dr. Nicole Kindred, at which time female test performance, clinical impressions, and treatment recommendations will be reviewed in detail. The patient understands she can contact our office should she require our assistance before this time.   A total of 100 minutes of billable time were spent with [name] by the technician, including test administration and scoring time. Billing for these services is reflected in Dr. Les Pou note.   This note reflects time spent with the psychometrician and does not include test scores, clinical history, or any interpretations made by Dr. Nicole Kindred. The full report will follow in a separate note.

## 2020-02-18 NOTE — Progress Notes (Signed)
[facility]  Patient Name: [name] MRN: [id number] Date of Birth: [birth year] Age: 74 y.o. Education: 12 years  Referral Circumstances and Background Information  [name] is a 74 y.o., right-hand dominant, widowed woman with a history of gait instability and slowness of movement felt to be due to vascular Parkinsonism. There was initially concern for a primary Parkinsonian disorder but she has not declined much over nearly a decade. She has been neuropsychologically evaluated multiple times including in October, 2012 [facility]), in March, 2016 [facility]), and at our practice with Dr. Bonita Quin in 2019. 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 She returns for reevaluation on referral from Dr. Carles Collet who is following her for her movement disorder.   On interview, the patient reported that she has "begun to stumble and fall again" and she wanted reevaluation to see "how far my dementia is progressing." She has been complaining of problems with memory and thinking since 2012 and the history she provides today is that the problems have been going on for 3 or 4 years. Her day-to-day symptoms include forgetting why she goes into a room and that was her only spontaneous complaint. On detailed review of symptoms, she acknowledged problems with memory, problems with attention and concentration, problems with processing speed, minor difficulties with orientation (no losing the month or the year, just the day of week), although she denied much in the way of problems with language, decision making, or problem solving. With respect to mood, the patient says that she is doing "pretty good." I asked about her history of depression and she feels like it is better. She stated that her energy is good. She stated that she is sleeping well, she usually gets about 10 hours of sleep and  sleeps through the night. She denied any dream enactment behavior or  signs/symptoms suggestive of RBD. I see there is a mention of RBD in the chart. Her appetite is normal for her. With respect to movement, she hasn't fallen since Christmas, she thinks that her tremors are better, although she does continue to have slowness and takes a while to do things.   With respect to functioning, the patient stopped driving after she had a seizure (felt to be medication induced from Wellbutrin). I asked if she wishes to go back to driving and she said that she knows her daughter "won't let" her drive, so she has given up. She doesn't sound upset about it though and stated that her daughter is good about giving her rides places. She is not big on going out anyways. Her daughter also manages most of the finances for the house, although she has her own bank accounts and is able to manage money. She stated that she is functioning at her normal level around the house, such as with hobbies and chores. She cooks with her granddaughter and does the laundry and cleaning adequately. She reads a lot and was able to tell me about the book she is reading currently. She stated that she doesn't go out into the community alone much, because of the driving, although she is able to go shopping and do other things with her daughter or with friends.   Past Medical History and Review of Relevant Studies   Patient Active Problem List   Diagnosis Date Noted   Vascular parkinsonism ([place]) 12/14/2019   Preventative health care 12/12/2017   Cramping of hands  03/23/2017   Osteoporosis 01/31/2017   GERD (gastroesophageal reflux disease) 08/26/2015   Recurrent UTI 05/27/2015   Diabetes mellitus type 2, diet-controlled ([place]) 12/03/2014   DNR (do not resuscitate) 11/23/2012   Dysphagia 12/07/2011   Orthostasis 12/07/2011   Bell's palsy    Hypersomnia 01/18/2011   ANXIETY DEPRESSION 04/16/2010   OBESITY, MORBID 02/16/2007     HYPERLIPIDEMIA, 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 There is a mild burden of volume loss showing more in the ventricles and temporal horns than the cortex although that may be on the basis of CSF dynamics as opposed to bona fide volume loss. Mid brain volume appears reasonable.    She has a CT scan from 03/23/2016 that showed stable volume loss as per the report (actual images not available).   Current Outpatient Medications  Medication Sig Dispense Refill   aspirin 81 MG tablet Take 81 mg by mouth daily.      Bioflavonoid Products (GRAPE SEED PO) Take 1 tablet by mouth daily.      calcium carbonate (TUMS EX) 750 MG chewable tablet Chew 1 tablet by mouth as needed.      Calcium Carbonate-Vitamin D (CALCIUM + D PO) Take 600 mg by mouth daily.     carbidopa-levodopa (SINEMET IR) 25-100 MG tablet TAKE 3 TABLETS BY MOUTH 3 TIMES A DAY 810 tablet 1   cetirizine (ZYRTEC) 10 MG tablet Take 10 mg by mouth daily.     Cholecalciferol (VITAMIN D3) 1000 UNITS CAPS Take 1 capsule by mouth daily.       clonazePAM (KLONOPIN) 0.5 MG tablet TAKE 1/2 TABLET BY MOUTH AT BEDTIME AND 1/2 TABLET DAILY AS NEEDED 90 tablet 1   escitalopram (LEXAPRO) 10 MG tablet TAKE 1 TABLET BY MOUTH EVERY DAY 90 tablet 2   feeding supplement (ENSURE COMPLETE) LIQD Take 237 mLs by mouth 2 (two) times daily between meals. (Patient taking differently: Take 237 mLs by mouth once. ) 1 Bottle 11   gabapentin (NEURONTIN) 100 MG capsule TAKE 1 CAPSULE BY MOUTH THREE TIMES A DAY 270 capsule 1   losartan-hydrochlorothiazide (HYZAAR) 50-12.5 MG tablet TAKE 1 TABLET BY MOUTH EVERY DAY FOR BLOOD PRESSURE 90 tablet 1   mirtazapine (REMERON) 30 MG tablet  Take 1 tablet (30 mg total) by mouth at bedtime. For sleep. 90 tablet 1   Multiple Vitamins-Minerals (CENTRUM SILVER ADULT 50+) TABS Take by mouth.     naproxen sodium (ALEVE) 220 MG tablet Take 220 mg by mouth in the morning and at bedtime.     rosuvastatin (CRESTOR) 5 MG tablet Take 1 tablet (5 mg total) by mouth every evening. For cholesterol. 90 tablet 3   vitamin B-12 (CYANOCOBALAMIN) 1000 MCG tablet Take 1,000 mcg by mouth daily.       No current facility-administered medications for this visit.   Family History  Problem Relation Age of Onset   Asthma Mother    COPD Father    Heart disease Father    Diabetes Sister    Vision loss Sister        legally blind   Hypertension Child    Hyperlipidemia Child    Diabetes Child    Diabetes Son    Breast cancer Neg Hx    There is  no  family history of dementia. There is a chart diagnosis of rapidly progressive dementia but I don't see the basis for that. She specifically denied any first degree relatives having Alzheimer's disease or dementia. There is no  family history of psychiatric illness.  Psychosocial History  Developmental, Educational and Employment History: The patient is a native of the [place] area. She reported a normal childhood development, [place]. She denied any history of abuse or neglect. In school, she reported that she did well, was never held back, and had no difficulties in particular areas. She graduated high school and then worked for [employer] for about 48 years. 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   Psychiatric History: The patient has a history of some involvement in counseling, which she said was many years ago (before she went to [place] in 2016). She is currently taking Lexapro and Remeron and it appears as though those are working with desired effect. She wasn't sure how long she has been on then. She is also on Clonazepam although that is for  RBD.   Substance Use History: The patient has never been a smoker, she doesn't drink alcohol, and she doesn't use any drugs.   Relationship History and Living Cimcumstances: The patient's husband passed 6 years ago, they were married for about 72 years. She was married once before that.   Mental Status and Behavioral Observations  Sensorium/Arousal: The patient's level of arousal was awake and alert. Hearing and vision were adequate with correction (glasses) for testing purposes. Orientation: The patient was alert and fully oriented.  Appearance: The patient was dressed in appropriate, casual clothing with reasonable grooming and hygiene.  Behavior: Pleasant, appropriate. Did start crying at one point in response to testing but re-gathered herself and persisted with some support.  Speech/language: The patient's speech was normal in rate, rhythm, volume, and prosody without any significant word finding pauses or paraphasic errors.  Gait/Posture: Small steps, intact but decreased armswing on exam with Dr. Carles Collet last visit Movement: No obvious bradykinesia, hypokinesia, she did appear somewhat tremulous in the upper extremities but no clear rest tremor was observed Social Comportment: Pleasant and appropriate Mood: "Good" Affect: Mainly euthymic Thought process/content: The patient's thought process was logical, linear, and goal directed. She presented as a reliable historian and had minimal difficulties assembling a reasonable detailed personal timeline.  Safety: No thoughts of harming self or others on direct questioning Insight: [employer].   Test Procedures  Wide Range Achievement Test - 4             Word Reading Neuropsychological Assessment Battery  List Learning  Story Learning  Daily Living Memory  Naming  Digit Span Repeatable Battery for the Assessment of Neuropsychological Status (Form A)  Figure Copy  Judgment of Line Orientation  Coding  Figure Recall The Dot Counting Test A  Random Letter Test Controlled Oral Word Association (F-A-S) Semantic Fluency (Animals) Trail Making Test A & B Complex Ideational Material Modified [place] Card Sorting Test Geriatric Depression Scale - Short Form Quick Dementia Rating System (completed by daughter, Kennyth Lose)  Plan  [name] was seen for a psychiatric diagnostic evaluation and neuropsychological testing. On interview, she presents as though she is doing much better from both a mood and cognition standpoint. She did not bring an informant into the appointment, although I was able to provider her daughter a QDRS for her input. She is screening at an MCI level.  Full and complete note with impressions, recommendations, and interpretation of test data to follow.   Viviano Simas Nicole Kindred, PsyD, [place] Clinical Neuropsychologist  Informed Consent and Coding/Compliance  Risks and benefits of the evaluation were discussed with the patient prior to all testing procedures. I conducted a clinical interview and neuropsychological testing (at least two tests) with Wilburt Finlay and Lamar Benes, B.S. (Technician) assisted me in administering additional test procedures. The patient was able to tolerate the testing procedures and the patient (and/or family if applicable) is likely to benefit from further follow up to receive the diagnosis and treatment recommendations, which will be rendered at the next encounter. Billing below reflects technician time, my direct face-to-face time with the patient, time spent in test administration, and time spent in professional activities including but not limited to: neuropsychological test interpretation, integration of neuropsychological test data with clinical history, report preparation, treatment planning, care coordination, and review of diagnostically pertinent medical history or studies.   Services associated with this encounter: Clinical Interview [id number]) plus 60 minutes (21747;  Neuropsychological Evaluation by Professional)  130 minutes ([id number]; Neuropsychological Evaluation by Professional, Adl.) 19 minutes (96728; Test Administration by Professional) 30 minutes ([id number]; Neuropsychological Testing by Technician) 70 minutes (04136; Neuropsychological Testing by Technician, Adl.)

## 2020-02-19 NOTE — Progress Notes (Signed)
[facility]  Patient Name: [name] MRN: [id number] Date of Birth: [birth year] Age: 74 y.o. Education: 12 years  Measurement properties of test scores: IQ, Index, and Standard Scores (SS): Mean = 100; Standard Deviation = 15 Scaled Scores (Ss): Mean = 10; Standard Deviation = 3 Z scores (Z): Mean = 0; Standard Deviation = 1 T scores (T); Mean = 50; Standard Deviation = 10  TEST SCORES:    Note: This summary of test scores accompanies the interpretive report and should not be interpreted by unqualified individuals or in isolation without reference to the report. Test scores are relative to age, gender, and educational history as available and appropriate.   Performance Validity        "A" Random Letter Test Raw  Descriptor      Errors 1 Within Expectation  The Dot Counting Test: 16 Within Expectation      Mental Status Screening     Total Score Descriptor  MMSE 25 MCI      Expected Functioning        Wide Range Achievement Test: Standard/Scaled Score Percentile      Word Reading 90 25      Attention/Processing Speed        Neuropsychological Assessment Battery (Attention Module, Form 1): T-score Percentile      Digits Forward 47 38      Digits Backwards 40 16      Repeatable Battery for the Assessment of Neuropsychological Status (Form A): Scaled Score Percentile      Coding 8 25      Language        Neuropsychological Assessment Battery (Language Module, Form 1): T-score Percentile      Naming   (30) 60 84      Verbal Fluency: T-score Percentile      Controlled Oral Word Association (F-A-S) 40 16      Semantic Fluency (Animals) 38 12      Memory:        Neuropsychological Assessment Battery (Memory Module, Form 1): T-score Percentile      List Learning           List A Immediate Recall   (3, 5, 4) 33 5         List B Immediate Recall   (4) 52 58         List A Short Delayed Recall   (2) 31 3         List A Long  Delayed Recall   (4) 41 18         List A Percent Retention   (200 %) --- >99         List A Long Delayed Yes/No Recognition Hits   (12) --- 79         List A Long Delayed Yes/No Recognition False Alarms   (13) --- 1         List A Recognition Discriminability Index --- 2     Story Learning           Immediate Recall   (23, 33) 34 38         Delayed Recall   (29) 47 38         Percent Retention   (88 %) --- 54      Daily Living Memory            Immediate Recall   (15, 12) 36 8  Delayed Recall   (4, 0) 24 <1          Percent Retention (31 %) --- <1          Recognition Hits    (4) --- <1      Repeatable Battery for the Assessment of Neuropsychological Status (Form A): Scaled Score Percentile         Figure Recall   (6) 6 9      Visuospatial/Constructional Functioning        Repeatable Battery for the Assessment of Neuropsychological Status (Form A): Standard/Scaled Score Percentile     Visuospatial/Constructional Index 78 7         Figure Copy   (17) 9 37         Judgment of Line Orientation   (9) --- <2      Executive Functioning        Modified [place] Card Sorting Test (MWCST): Standard/T-Score Percentile      Number of Categories Correct 30 2      Number of Perseverative Errors 40 16      Number of Total Errors 32 4      Percent Perseverative Errors 39 14  Executive Function Composite 75 5      Trail Making Test: T-Score Percentile      Part A 43 25      Part B 39 14      [place] Diagnostic Aphasia Exam: Raw Score Scaled Score      Complex Ideational Material 11 9      Clock Drawing Raw Score Descriptor      Command 6 Mild Impairment      Rating Scales        Clinical Dementia Rating Raw Score Descriptor      Sum of Boxes 3.0 Very Mild Dementia      Global Score 0.5 MCI      Quick Dementia Rating System Raw Score Descriptor      Sum of Boxes 3.5 Very Mild Dementia      Total Score 6 Mild Dementia  Geriatric Depression Scale - Short Form 6 Positive     Horace Wishon V. Nicole Kindred PsyD, [place] Clinical Neuropsychologist

## 2020-02-25 ENCOUNTER — Encounter: Payer: Medicare Other | Admitting: Counselor

## 2020-02-26 ENCOUNTER — Other Ambulatory Visit: Payer: Self-pay

## 2020-02-26 ENCOUNTER — Ambulatory Visit (INDEPENDENT_AMBULATORY_CARE_PROVIDER_SITE_OTHER): Payer: Medicare Other | Admitting: Counselor

## 2020-02-26 ENCOUNTER — Encounter: Payer: Self-pay | Admitting: Counselor

## 2020-02-26 ENCOUNTER — Other Ambulatory Visit (INDEPENDENT_AMBULATORY_CARE_PROVIDER_SITE_OTHER): Payer: Medicare Other

## 2020-02-26 DIAGNOSIS — F09 Unspecified mental disorder due to known physiological condition: Secondary | ICD-10-CM | POA: Diagnosis not present

## 2020-02-26 DIAGNOSIS — I679 Cerebrovascular disease, unspecified: Secondary | ICD-10-CM

## 2020-02-26 DIAGNOSIS — G4752 REM sleep behavior disorder: Secondary | ICD-10-CM

## 2020-02-26 DIAGNOSIS — E782 Mixed hyperlipidemia: Secondary | ICD-10-CM

## 2020-02-26 LAB — LIPID PANEL
Cholesterol: 133 mg/dL (ref 0–200)
HDL: 48.5 mg/dL (ref >39.00)
LDL Cholesterol: 48 mg/dL (ref 0–99)
NonHDL: 84.24
Total CHOL/HDL Ratio: 3
Triglycerides: 180 mg/dL — ABNORMAL HIGH (ref 0.0–149.0)
VLDL: 36 mg/dL (ref 0.0–40.0)

## 2020-02-26 NOTE — Progress Notes (Signed)
[facility]  Patient Name: [name] MRN: [id number] Date of Birth: 01-12-1946 Age: 74 y.o. Education: 12 years  Clinical Impressions  [name] is a 74 y.o., right-hand dominant, widowed woman with a history of vascular Parkinsonism and multiple previous neuropsychological evaluations due to cognitive complaints since 2012. At her last evaluation with Dr. Bonita Quin, her scores had improved and she was felt to have no more than a mild cognitive impairment level problem, whereas she was felt to have a dementia level problem in the past. Within the present evaluaztion, there was a trend toward worse performance in some areas but there are no very significant differences relative to the last testing and the best diagnosis remains mild cognitive impairment. Her MCI involves mainly frontal-subcortical type problems, making vascular etiologies high on the differential, although similar problems can also be seen in other conditions involving frontal-subcortical system dysfunction. Medication side effects and psychiatric issues may also be contributory. Regarding driving safety, there are no specific indications that she is unsafe to drive from a cognitive perspective.   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 I would like to underscore that this is a trend, and there are no very significant or concerning differences. The best diagnosis thus remains mild cognitive impairment.  The major difference between mild cognitive impairment (MCI) and dementia is in severity and potential prognosis. Once someone reaches a level of severity adequate to be diagnosed with a dementia, there is usually progression over time,  though this may be years. On the other hand, mild cognitive impairment, while a significant risk for dementia in future, does not always progress to dementia, and in some instances stays the same or can even revert to normal. It is important to realize that if MCI is due to underlying Alzheimer's disease, it will most likely progress to dementia eventually. The rate of conversion to Alzheimer's dementia from amnestic MCI is about 15% per year versus the general population risk of conversion of 2% per year.   In addition to those standard aspects of care, I suggest that you engage in healthy lifestyle changes, such as eating a heart healthy brain healthy diet. One excellent choice is the MIND diet, which is below. I would also suggest that you follow Dr. Doristine Devoid recommendation for physical therapy, because it would be beneficial both for your brain and for your movement disorder to increase your exercise level. I would also suggest that you increase your activity level to treat your depression.   There is now good quality evidence from at least one large scale study that a modified mediterranean diet may help slow cognitive decline. This is known as the "MIND" diet. The Mind diet is not so much a specific diet as it is a set of recommendations for things that you should and should not eat.   Foods that are ENCOURAGED on the MIND Diet:  Green, leafy vegetables: Aim for six or more servings per week. This includes kale, spinach, cooked greens and salads.  All other vegetables: Try to eat another vegetable in addition to the green leafy vegetables at least once a day. It is best to choose non-starchy vegetables because they have a lot of nutrients with a low number of calories.  Berries: Eat berries at least twice a  week. There is a plethora of research on strawberries, and other berries such as blueberries, raspberries and blackberries have also been found to have antioxidant and brain health benefits.  Nuts:  Try to get five servings of nuts or more each week. The creators of the [place] don't specify what kind of nuts to consume, but it is probably best to vary the type of nuts you eat to obtain a variety of nutrients. Peanuts are a legume and do not fall into this category.  Olive oil: Use olive oil as your main cooking oil. There may be other heart-healthy alternatives such as algae oil, though there is not yet sufficient research upon which to base a formal recommendation.  Whole grains: Aim for at least three servings daily. Choose minimally processed grains like oatmeal, quinoa, brown rice, whole-wheat pasta and 100% whole-wheat bread.  Fish: Eat fish at least once a week. It is best to choose fatty fish like salmon, sardines, trout, tuna and mackerel for their high amounts of omega-3 fatty acids.  Beans: Include beans in at least four meals every week. This includes all beans, lentils and soybeans.  Poultry: Try to eat chicken or [place] at least twice a week. Note that fried chicken is not encouraged on the MIND diet.  Wine: Aim for no more than one glass of alcohol daily. Both red and white wine may benefit the brain. However, much research has focused on the red wine compound resveratrol, which may help protect against Alzheimer's disease.  Foods that are DISCOURAGED on the MIND Diet: Butter and margarine: Try to eat less than 1 tablespoon (about 14 grams) daily. Instead, try using olive oil as your primary cooking fat, and dipping your bread in olive oil with herbs.  Cheese: The MIND diet recommends limiting your cheese consumption to less than once per week.  Red meat: Aim for no more than three servings each week. This includes all beef, pork, lamb and products made from these meats.  Maceo Pro food: The MIND diet highly discourages fried food, especially the kind from fast-food restaurants. Limit your consumption to less than once per week.  Pastries and sweets: This includes most of the processed  junk food and desserts you can think of. Ice cream, cookies, brownies, snack cakes, donuts, candy and more. Try to limit these to no more than four times a week.  There is a significant research base and evidence of effectiveness for something called "behavioral activation," which is a fancy way of saying that you should increase your activity level. In general, people do not feel as happy or do as well when they are not doing much. This can include things like getting out for walks, re-engaging in hobbies, spending time with family or friends, or learning a new hobby. It's not so important what you do as that you enjoy it and stick with it. Depression can start a vicious cycle where you are not doing a lot because you don't feel well, which leads to less things to be excited and happy about, and thus more depression and behavioral avoidance. It can be hard to change this pattern once it has started but most people find that they feel better when they start doing more even if they don't enjoy it at first.   Test Findings  Test scores are summarized in additional documentation associated with this encounter. Test scores are relative to age, gender, and educational history as available and appropriate. There were no concerns about performance validity  as all findings fell within normal expectations.   General Intellectual Functioning/Achievement:  Performance on single word reading was toward the lower end of the average range, which is consistent with the previous estimate. This was used in conjunction with data from her evaluation with Dr. Bonita Quin as a standard of comparison.   Attention and Processing Efficiency: Performance on indicators of attention and processing efficiency was similar to her previous scores. Digit repetition forward was average and digit repetition backward was low average. Performance on timed number symbol coding was average. Simple numeric sequencing generated a  low average  score.   Language: With respect to language functioning, there was a trend toward lower performance on semantic verbal fluency whereas her scores in other areas were the same. Visual object confrontation naming was normal. Generation of words in response to the letters F-A-S was low average and generation of words in response to the category prompt "animals" was low average.   Visuospatial Function: Performance was mixed on measures of visuospatial and constructional functioning. The findings were similar to a bit weaker than at the previous evaluation, with an unusually low score on the overall index. Copy of a line drawing was average and would have been near errorless had she had better planning (drew the diagonal cross in a piece meal fashion). By contrast, she had a hard time with judgment of angular line orientations, achieving an extremely low score.   Learning and Memory: Learning and memory measures suggested inconsistent performance that is viewed as likely on the basis of executive control problems. Nevertheless, there is no clear indication of memory storage problems. There is a trend toward weaker performance than at her previous assessment.   In the verbal realm, her immeidate recall for a 12-item word list was weak with 3, 5, and 4 words across three trials generating an unusually low score. She only recalled 2 words on short delayed recall after the presentation of a distractor alternate word list (retroactive interference) but then she recalled all 4 words on delayed recall, which is low average. Recognition discriminability for target words vs. False choices was low (likely reflecting source memory problems). Memory for a short story was significantly better with the patient achieving average scores for immediate and delayed recall. Immediate recall of brief daily-living type information was unusually low whereas retention was poor and delayed recall was extremely low. Recognition  discriminability for the information was also extremely low.   In the visual realm, delayed recall of a modestly complex figure stimulus was unusually low.   Executive Functions: Findings from across the test battery (e.g., memory profile, poor planning on figure copy) are suggestive of executive control problems and there were also several low scores within this domain. The Executive Function Composite of the Modified [employer] generated an unusually low score. Alternating sequencing of numbers and letters of the alphabet was low average (almost unusually low). Generation of words in response to the letters F-A-S was unusually low. Clock drawing was suggestive of "mild impairment" with minor errors in spatial arrangement of the numbers and poor representation of two hands. By contrast, she did well when reasoning with verbal information on the Complex Ideational Material.   Rating Scale(s): Ms. Scibilia screened positive for the presence of depression. She was characterized by her daughter as functioning at an MCI level. I was able to rate a CDR for her and her global score is 0.5, her Sum of Boxes is 3.0, which is viewed as consistent with  mild cognitive impairment as opposed to dementia.   Viviano Simas Nicole Kindred PsyD, [place] Clinical Neuropsychologist

## 2020-02-26 NOTE — Patient Instructions (Signed)
Your performance and presentation were very similar to your previous testing, although there were indications of a trend toward lower performance in a few areas. I would like to underscore that this is a trend, and there are no very significant or concerning differences. The best diagnosis thus remains mild cognitive impairment.  The major difference between mild cognitive impairment (MCI) and dementia is in severity and potential prognosis. Once someone reaches a level of severity adequate to be diagnosed with a dementia, there is usually progression over time, though this may be years. On the other hand, mild cognitive impairment, while a significant risk for dementia in future, does not always progress to dementia, and in some instances stays the same or can even revert to normal.It is important to realize that if MCI is due to underlying Alzheimer's disease, it will most likely progress to dementia eventually. The rate of conversion to Alzheimer's dementiafrom amnestic MCI is about 15% per year versus the general population risk of conversion of 2% per year.  In your case, your mild cognitive impairment primarily involves "frontal-subcortical" systems dysfunction, which means that it could be due to vascular disease or, if Parkinson's is a consideration, it could be due to that. The type of vascular disease you have involves wear and tear on blood vessels in the brain over time. This is a common issue in the elderly and is not necessarily progressive so long as you are assertively managing risk factors including high blood pressure, high cholesterol, and blood sugar issues.   In addition to those standard aspects of care, I suggest that you engage in healthy lifestyle changes, such as eating a heart healthy brain healthy diet. One excellent choice is the MIND diet, which is below. I would also suggest that you follow Dr. Doristine Devoid recommendation for physical therapy, because it would be beneficial both for  your brain and for your movement disorder to increase your exercise level. I would also suggest that you increase your activity level to treat your depression.   There is now good quality evidence from at least one large scale study that a modified mediterranean diet may help slow cognitive decline. This is known as the "MIND" diet. The Mind diet is not so much a specific diet as it is a set of recommendations for things that you should and should not eat.   Foods that are ENCOURAGED on the MIND Diet:  Green, leafy vegetables: Aim for six or more servings per week. This includes kale, spinach, cooked greens and salads.  All other vegetables: Try to eat another vegetable in addition to the green leafy vegetables at least once a day. It is best to choose non-starchy vegetables because they have a lot of nutrients with a low number of calories.  Berries: Eat berries at least twice a week. There is a plethora of research on strawberries, and other berries such as blueberries, raspberries and blackberries have also been found to have antioxidant and brain health benefits.  Nuts: Try to get five servings of nuts or more each week. The creators of the [place] don't specify what kind of nuts to consume, but it is probably best to vary the type of nuts you eat to obtain a variety of nutrients. Peanuts are a legume and do not fall into this category.  Olive oil: Use olive oil as your main cooking oil. There may be other heart-healthy alternatives such as algae oil, though there is not yet sufficient research upon which to base  a formal recommendation.  Whole grains: Aim for at least three servings daily. Choose minimally processed grains like oatmeal, quinoa, brown rice, whole-wheat pasta and 100% whole-wheat bread.  Fish: Eat fish at least once a week. It is best to choose fatty fish like salmon, sardines, trout, tuna and mackerel for their high amounts of omega-3 fatty acids.  Beans: Include beans in at least  four meals every week. This includes all beans, lentils and soybeans.  Poultry: Try to eat chicken or [place] at least twice a week. Note that fried chicken is not encouraged on the MIND diet.  Wine: Aim for no more than one glass of alcohol daily. Both red and white wine may benefit the brain. However, much research has focused on the red wine compound resveratrol, which may help protect against Alzheimer's disease.  Foods that are DISCOURAGED on the MIND Diet: Butter and margarine: Try to eat less than 1 tablespoon (about 14 grams) daily. Instead, try using olive oil as your primary cooking fat, and dipping your bread in olive oil with herbs.  Cheese: The MIND diet recommends limiting your cheese consumption to less than once per week.  Red meat: Aim for no more than three servings each week. This includes all beef, pork, lamb and products made from these meats.  Maceo Pro food: The MIND diet highly discourages fried food, especially the kind from fast-food restaurants. Limit your consumption to less than once per week.  Pastries and sweets: This includes most of the processed junk food and desserts you can think of. Ice cream, cookies, brownies, snack cakes, donuts, candy and more. Try to limit these to no more than four times a week.  There is a significant research base and evidence of effectiveness for something called "behavioral activation," which is a fancy way of saying that you should increase your activity level. In general, people do not feel as happy or do as well when they are not doing much. This can include things like getting out for walks, re-engaging in hobbies, spending time with family or friends, or learning a new hobby. It's not so important what you do as that you enjoy it and stick with it. Depression can start a vicious cycle where you are not doing a lot because you don't feel well, which leads to less things to be excited and happy about, and thus more depression and behavioral  avoidance. It can be hard to change this pattern once it has started but most people find that they feel better when they start doing more even if they don't enjoy it at first.

## 2020-02-26 NOTE — Progress Notes (Signed)
[facility]  Telemedicine statement:  I discussed the limitations of neuropsychological care via telemedicine and the availability of in person appointments. The patient expressed understanding and agreed to proceed. The patient was verified with two identifiers.  The visit modality was: telephonic The patient location was: home The provider location was: office  The following individuals participated: Leanora Ivanoff  Feedback Note: I met with Wilburt Finlay to review the findings resulting from her neuropsychological evaluation. Since the last appointment, she has been about the same. She continues to have problems with "stumbling." Her daughter was present and I took the opportunity to clarify some of the history. She stated that the patient does in fact act out her dreams (she had denied this previously). She will get up, put on her glasses, and make her bed all while she is asleep. "It's almost like sleep walking." Time was spent reviewing the impressions and recommendations that are detailed in the evaluation report. 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 We reviewed healthy lifestyle changes for treatment of MCI and the importance of exercise. I provided some motivational enhancement surrounding PT, which was discussed at her last visit with Dr. Carles Collet, although she is quite insistent she doesn't want to get it because she doesn't like to be around people she doesn't know. They will follow up with Dr. Carles Collet.  I took time to explain the findings and answer all the patient's questions. I encouraged Ms. Hintze to contact me should she have any further questions or if further follow up is desired.   Current Medications and Medical History   Current  Outpatient Medications  Medication Sig Dispense Refill  . aspirin 81 MG tablet Take 81 mg by mouth daily.     Marland Kitchen Bioflavonoid Products (GRAPE SEED PO) Take 1 tablet by mouth daily.     . calcium carbonate (TUMS EX) 750 MG chewable tablet Chew 1 tablet by mouth as needed.     . Calcium Carbonate-Vitamin D (CALCIUM + D PO) Take 600 mg by mouth daily.    . carbidopa-levodopa (SINEMET IR) 25-100 MG tablet TAKE 3 TABLETS BY MOUTH 3 TIMES A DAY 810 tablet 1  . cetirizine (ZYRTEC) 10 MG tablet Take 10 mg by mouth daily.    . Cholecalciferol (VITAMIN D3) 1000 UNITS CAPS Take 1 capsule by mouth daily.      . clonazePAM (KLONOPIN) 0.5 MG tablet TAKE 1/2 TABLET BY MOUTH AT BEDTIME AND 1/2 TABLET DAILY AS NEEDED 90 tablet 1  . escitalopram (LEXAPRO) 10 MG tablet TAKE 1 TABLET BY MOUTH EVERY DAY 90 tablet 2  . feeding supplement (ENSURE COMPLETE) LIQD Take 237 mLs by mouth 2 (two) times daily between meals. (Patient taking differently: Take 237 mLs by mouth once. ) 1 Bottle 11  . gabapentin (NEURONTIN) 100 MG capsule TAKE 1 CAPSULE BY MOUTH THREE TIMES A DAY 270 capsule 1  . losartan-hydrochlorothiazide (HYZAAR) 50-12.5 MG tablet TAKE 1 TABLET BY MOUTH EVERY DAY FOR BLOOD PRESSURE 90 tablet 1  . mirtazapine (REMERON) 30 MG tablet Take 1 tablet (30 mg total) by mouth at bedtime. For sleep. 90 tablet 1  . Multiple Vitamins-Minerals (CENTRUM SILVER ADULT 50+) TABS Take by mouth.    . naproxen sodium (ALEVE) 220 MG tablet Take 220 mg by mouth in the morning and at bedtime.    . rosuvastatin (  CRESTOR) 5 MG tablet Take 1 tablet (5 mg total) by mouth every evening. For cholesterol. 90 tablet 3  . vitamin B-12 (CYANOCOBALAMIN) 1000 MCG tablet Take 1,000 mcg by mouth daily.       No current facility-administered medications for this visit.    Patient Active Problem List   Diagnosis Date Noted  . 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 Preventative health care 12/12/2017  . Cramping of hands 03/23/2017  .  Osteoporosis 01/31/2017  . GERD (gastroesophageal reflux disease) 08/26/2015  . Recurrent UTI 05/27/2015  . Diabetes mellitus type 2, diet-controlled ([place]) 12/03/2014  . DNR (do not resuscitate) 11/23/2012  . Dysphagia 12/07/2011  . Orthostasis 12/07/2011  . Bell's palsy   . Hypersomnia 01/18/2011  . ANXIETY DEPRESSION 04/16/2010  . OBESITY, MORBID 02/16/2007  . HYPERLIPIDEMIA, MIXED 01/31/2007  . HYPERTENSION, BENIGN ESSENTIAL 01/31/2007    Mental Status and Behavioral Observations  YARITHZA MINK was available at the prespecified time for this telephonic appointment and was alert and generally oriented (orientation not formally assessed). Speech was normal in rate, rhythm, volume, and prosody. Self-reported mood was "ok" and affect as assessed by vocal quality was mainly neutral. Thought process was logical and goal oriented, to the extent I could tell given her verbal participation in the encounter and thought content was appropriate. There were no safety concerns identified at today's encounter, such as thoughts of harming self or others.   Plan  Feedback provided regarding the patient's neuropsychological evaluation. She has MCI involving mainly frontal-subcortical systems dysfunction, which could certainly be due to vascular disease but could be due to other conditions affecting those functional units. Wilburt Finlay was encouraged to contact me if any questions arise or if further follow up is desired.   Viviano Simas Nicole Kindred, PsyD, [employer] Clinical Neuropsychologist  Service(s) Provided at This Encounter: 27 minutes [phone]; Conjoint therapy with patient present)

## 2020-02-28 NOTE — Addendum Note (Signed)
Addended by: Randall An on: 02/28/2020 10:44 AM   Modules accepted: Orders

## 2020-02-28 NOTE — Telephone Encounter (Signed)
Pt was in on 8/3 for lab draw but BMP was not drawn, a lipid panel was drawn. Tried to add a BMP but sample was too old.  Contacted pt's daughter, Geni Bers and she agreed to bring pt back in tomorrow for another lab draw.

## 2020-02-29 ENCOUNTER — Other Ambulatory Visit: Payer: Self-pay

## 2020-02-29 ENCOUNTER — Other Ambulatory Visit (INDEPENDENT_AMBULATORY_CARE_PROVIDER_SITE_OTHER): Payer: Medicare Other

## 2020-02-29 DIAGNOSIS — M810 Age-related osteoporosis without current pathological fracture: Secondary | ICD-10-CM

## 2020-02-29 LAB — BASIC METABOLIC PANEL
BUN: 20 mg/dL (ref 6–23)
CO2: 34 mEq/L — ABNORMAL HIGH (ref 19–32)
Calcium: 10.5 mg/dL (ref 8.4–10.5)
Chloride: 102 mEq/L (ref 96–112)
Creatinine, Ser: 0.71 mg/dL (ref 0.40–1.20)
GFR: 80.21 mL/min (ref >60.00)
Glucose, Bld: 141 mg/dL — ABNORMAL HIGH (ref 70–99)
Potassium: 4.3 mEq/L (ref 3.5–5.1)
Sodium: 141 mEq/L (ref 135–145)

## 2020-03-03 NOTE — Telephone Encounter (Signed)
Calcium is normal and Cr Cl is 80.82mL/min. Labs normal, ok for inj on 8/18

## 2020-03-04 ENCOUNTER — Encounter: Payer: Self-pay | Admitting: *Deleted

## 2020-03-12 ENCOUNTER — Ambulatory Visit (INDEPENDENT_AMBULATORY_CARE_PROVIDER_SITE_OTHER): Payer: Medicare Other

## 2020-03-12 ENCOUNTER — Other Ambulatory Visit: Payer: Self-pay

## 2020-03-12 DIAGNOSIS — M810 Age-related osteoporosis without current pathological fracture: Secondary | ICD-10-CM

## 2020-03-12 MED ORDER — DENOSUMAB 60 MG/ML ~~LOC~~ SOSY
60.0000 mg | PREFILLED_SYRINGE | Freq: Once | SUBCUTANEOUS | Status: AC
  Administered 2020-03-12: 60 mg via SUBCUTANEOUS

## 2020-03-12 NOTE — Progress Notes (Signed)
Per orders of Allie Bossier, NP, injection of Prolia, given by Randall An. Patient tolerated injection well.

## 2020-03-20 ENCOUNTER — Other Ambulatory Visit: Payer: Self-pay | Admitting: Neurology

## 2020-04-14 ENCOUNTER — Other Ambulatory Visit: Payer: Self-pay | Admitting: Neurology

## 2020-04-14 NOTE — Telephone Encounter (Signed)
Rx(s) sent to pharmacy electronically.  

## 2020-04-15 ENCOUNTER — Other Ambulatory Visit: Payer: Self-pay | Admitting: Neurology

## 2020-04-18 ENCOUNTER — Encounter: Payer: Self-pay | Admitting: Family Medicine

## 2020-04-18 ENCOUNTER — Telehealth (INDEPENDENT_AMBULATORY_CARE_PROVIDER_SITE_OTHER): Payer: Medicare Other | Admitting: Family Medicine

## 2020-04-18 ENCOUNTER — Other Ambulatory Visit: Payer: Medicare Other

## 2020-04-18 ENCOUNTER — Other Ambulatory Visit: Payer: Self-pay | Admitting: Primary Care

## 2020-04-18 ENCOUNTER — Other Ambulatory Visit: Payer: Self-pay | Admitting: Internal Medicine

## 2020-04-18 ENCOUNTER — Other Ambulatory Visit: Payer: Self-pay

## 2020-04-18 DIAGNOSIS — Z20822 Contact with and (suspected) exposure to covid-19: Secondary | ICD-10-CM

## 2020-04-18 DIAGNOSIS — R05 Cough: Secondary | ICD-10-CM | POA: Diagnosis not present

## 2020-04-18 DIAGNOSIS — R059 Cough, unspecified: Secondary | ICD-10-CM | POA: Insufficient documentation

## 2020-04-18 MED ORDER — AZITHROMYCIN 250 MG PO TABS: ORAL_TABLET | ORAL | 0 refills | Status: DC

## 2020-04-18 MED ORDER — BENZONATATE 200 MG PO CAPS: 200.0000 mg | ORAL_CAPSULE | Freq: Two times a day (BID) | ORAL | 0 refills | Status: DC | PRN

## 2020-04-18 NOTE — Progress Notes (Signed)
VIRTUAL VISIT Due to national recommendations of social distancing due to [place] 19, a virtual visit is felt to be most appropriate for this patient at this time.   I connected with the patient on 04/18/20 at  2:40 PM EDT by virtual telehealth platform and verified that I am speaking with the correct person using two identifiers.   I discussed the limitations, risks, security and privacy concerns of performing an evaluation and management service by  virtual telehealth platform and the availability of in person appointments. I also discussed with the patient that there may be a patient responsible charge related to this service. The patient expressed understanding and agreed to proceed.  Patient location: Home Provider Location: [place] [facility] Participants: Eliezer Lofts and Wilburt Finlay   Chief Complaint  Patient presents with  . Cough    x2-3 days phlegm    History of Present Illness: Cough This is a new problem. The problem has been gradually worsening. The cough is productive of sputum. Associated symptoms include nasal congestion, postnasal drip and a sore throat. Pertinent negatives include no chills, ear congestion, ear pain, fever, myalgias, shortness of breath or wheezing. The symptoms are aggravated by lying down. Treatments tried: Mucinex. The treatment provided mild relief. There is no history of asthma, bronchiectasis, bronchitis, emphysema, environmental allergies or pneumonia.    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   HX of: No chronic lung disease, nonsmoker COVID risk for complications: 5 Patient Active Problem List   Diagnosis Date Noted  . 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 Preventative health care 12/12/2017  . Cramping of hands 03/23/2017  . Osteoporosis 01/31/2017  . GERD (gastroesophageal reflux disease) 08/26/2015  . Recurrent UTI 05/27/2015  . Diabetes mellitus type 2, diet-controlled ([place]) 12/03/2014   . DNR (do not resuscitate) 11/23/2012  . Dysphagia 12/07/2011  . Orthostasis 12/07/2011  . Bell's palsy   . Hypersomnia 01/18/2011  . ANXIETY DEPRESSION 04/16/2010  . OBESITY, MORBID 02/16/2007  . HYPERLIPIDEMIA, MIXED 01/31/2007  . HYPERTENSION, BENIGN ESSENTIAL 01/31/2007    COVID 19 screen No recent travel or known exposure to COVID19 The patient denies respiratory symptoms of COVID 19 at this time.  The importance of social distancing was discussed today.   Review of Systems  Constitutional: Negative for chills and fever.  HENT: Positive for postnasal drip and sore throat. Negative for ear pain.   Respiratory: Positive for cough. Negative for shortness of breath and wheezing.   Musculoskeletal: Negative for myalgias.  Endo/Heme/Allergies: Negative for environmental allergies.      Past Medical History:  Diagnosis Date  . Adjustment disorder with depressed mood   . Anxiety   . Bell's palsy    1985  . Dementia   . Depressive disorder, not elsewhere classified   . 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 DVT (deep venous thrombosis), left "early 2000's"   RLE  . Dysmetabolic syndrome X   . Essential hypertension, benign   . YCXKGYJE(563.1) 05/2011   "pretty often since they put me on Parkinson's medicine"  . History of shingles    "as a teen and in my 66's"  . Mixed hyperlipidemia   . Morbid obesity ([place])   . Neurodegenerative gait disorder   . Personal history of thrombophlebitis   . Syncope and collapse 12/06/11   "this am; I pass out fairly often"  . Type II or unspecified type diabetes mellitus  without mention of complication, not stated as uncontrolled 12/06/11   "not anymore; had lap band OR"    reports that she has never smoked. She has never used smokeless tobacco. She reports that she does not drink alcohol and does not use drugs.   Current Outpatient Medications:  .  aspirin 81 MG tablet, Take 81 mg by mouth daily. , Disp: ,  Rfl:  .  Bioflavonoid Products (GRAPE SEED PO), Take 1 tablet by mouth daily. , Disp: , Rfl:  .  calcium carbonate (TUMS EX) 750 MG chewable tablet, Chew 1 tablet by mouth as needed. , Disp: , Rfl:  .  Calcium Carbonate-Vitamin D (CALCIUM + D PO), Take 600 mg by mouth daily., Disp: , Rfl:  .  carbidopa-levodopa (SINEMET IR) 25-100 MG tablet, TAKE 3 TABLETS BY MOUTH 3 TIMES A DAY, Disp: 810 tablet, Rfl: 1 .  cetirizine (ZYRTEC) 10 MG tablet, Take 10 mg by mouth daily., Disp: , Rfl:  .  Cholecalciferol (VITAMIN D3) 1000 UNITS CAPS, Take 1 capsule by mouth daily.  , Disp: , Rfl:  .  clonazePAM (KLONOPIN) 0.5 MG tablet, TAKE 1/2 TABLET BY MOUTH AT BEDTIME AND 1/2 DAILY AS NEEDED, Disp: 90 tablet, Rfl: 1 .  escitalopram (LEXAPRO) 10 MG tablet, TAKE 1 TABLET BY MOUTH EVERY DAY, Disp: 90 tablet, Rfl: 2 .  feeding supplement (ENSURE COMPLETE) LIQD, Take 237 mLs by mouth 2 (two) times daily between meals. (Patient taking differently: Take 237 mLs by mouth once. ), Disp: 1 Bottle, Rfl: 11 .  gabapentin (NEURONTIN) 100 MG capsule, TAKE 1 CAPSULE BY MOUTH THREE TIMES A DAY, Disp: 270 capsule, Rfl: 1 .  losartan-hydrochlorothiazide (HYZAAR) 50-12.5 MG tablet, TAKE 1 TABLET BY MOUTH EVERY DAY FOR BLOOD PRESSURE, Disp: 90 tablet, Rfl: 1 .  mirtazapine (REMERON) 30 MG tablet, Take 1 tablet (30 mg total) by mouth at bedtime. For sleep., Disp: 90 tablet, Rfl: 1 .  Multiple Vitamins-Minerals (CENTRUM SILVER ADULT 50+) TABS, Take by mouth., Disp: , Rfl:  .  naproxen sodium (ALEVE) 220 MG tablet, Take 220 mg by mouth in the morning and at bedtime., Disp: , Rfl:  .  rosuvastatin (CRESTOR) 5 MG tablet, Take 1 tablet (5 mg total) by mouth every evening. For cholesterol., Disp: 90 tablet, Rfl: 3 .  vitamin B-12 (CYANOCOBALAMIN) 1000 MCG tablet, Take 1,000 mcg by mouth daily.  , Disp: , Rfl:    Observations/Objective: There were no vitals taken for this visit.  Physical Exam  Physical Exam Constitutional:       General: The patient is not in acute distress. Pulmonary:     Effort: Pulmonary effort is normal. No respiratory distress.  Neurological:     Mental Status: The patient is alert and oriented to person, place, and time.  Psychiatric:        Mood and Affect: Mood normal.        Behavior: Behavior normal.   Assessment and Plan Cough  Likely COVID19  infection vs other viral infection. No clear sign of bacterial infection at this time.  Recommend testing .. info on how to obtain testing provided through [facility]. No SOB.  No red flags/need for ER visit or in-person exam at respiratory clinic at this time.   If not improving as expected... can fill azithromycin rx for coverage of bacterial bronchitis.    Pt moderate risk for COVID complications. If COVID positive.. pt notified to call ASAP as she is a candidate for MAB infusion. If SOB begins  symptoms worsening.. have low threshold for in-person exam, if severe shortness of breath ER visit recommended.  Can monitor Oxygen saturation at home with home monitor if able to obtain.  Go to ER if O2 sat < 90% on room air.   Provided info about prevention of spread of COVID 19.      I discussed the assessment and treatment plan with the patient. The patient was provided an opportunity to ask questions and all were answered. The patient agreed with the plan and demonstrated an understanding of the instructions.   The patient was advised to call back or seek an in-person evaluation if the symptoms worsen or if the condition fails to improve as anticipated.     Eliezer Lofts, MD

## 2020-04-18 NOTE — Patient Instructions (Signed)
Likely COVID19  infection vs other viral infection. No clear sign of bacterial infection at this time.  Recommend testing .. info on how to obtain testing provided through [facility]. No SOB.  No red flags/need for ER visit or in-person exam at respiratory clinic at this time.  Symptomatic care with benzonatate  for cough as well as mucinex.   If not improving as expected... can fill azithromycin rx for coverage of bacterial bronchitis.    Pt moderate risk for COVID complications. If COVID positive.. pt notified to call ASAP as she is a candidate for MAB infusion. If SOB begins symptoms worsening.. have low threshold for in-person exam, if severe shortness of breath ER visit recommended.  Can monitor Oxygen saturation at home with home monitor if able to obtain.  Go to E

## 2020-04-18 NOTE — Assessment & Plan Note (Signed)
Likely COVID19  infection vs other viral infection. No clear sign of bacterial infection at this time.  Recommend testing .. info on how to obtain testing provided through [facility]. No SOB.  No red flags/need for ER visit or in-person exam at respiratory clinic at this time.   If not improving as expected... can fill azithromycin rx for coverage of bacterial bronchitis.    Pt moderate risk for COVID complications. If COVID positive.. pt notified to call ASAP as she is a candidate for MAB infusion. If SOB begins symptoms worsening.. have low threshold for in-person exam, if severe shortness of breath ER visit recommended.  Can monitor Oxygen saturation at home with home monitor if able to obtain.  Go to ER if O2 sat < 90% on room air.   Provided info about prevention of spread of COVID 19.

## 2020-04-19 LAB — NOVEL CORONAVIRUS, NAA: SARS-CoV-2, NAA: NOT DETECTED

## 2020-04-19 LAB — SARS-COV-2, NAA 2 DAY TAT

## 2020-06-04 ENCOUNTER — Ambulatory Visit (INDEPENDENT_AMBULATORY_CARE_PROVIDER_SITE_OTHER): Payer: Medicare Other

## 2020-06-04 DIAGNOSIS — Z23 Encounter for immunization: Secondary | ICD-10-CM | POA: Diagnosis not present

## 2020-06-18 ENCOUNTER — Other Ambulatory Visit: Payer: Self-pay | Admitting: Primary Care

## 2020-07-04 DIAGNOSIS — H903 Sensorineural hearing loss, bilateral: Secondary | ICD-10-CM | POA: Diagnosis not present

## 2020-07-04 DIAGNOSIS — J342 Deviated nasal septum: Secondary | ICD-10-CM | POA: Diagnosis not present

## 2020-07-04 DIAGNOSIS — J343 Hypertrophy of nasal turbinates: Secondary | ICD-10-CM | POA: Diagnosis not present

## 2020-07-10 ENCOUNTER — Other Ambulatory Visit: Payer: Self-pay | Admitting: Primary Care

## 2020-07-10 DIAGNOSIS — F341 Dysthymic disorder: Secondary | ICD-10-CM

## 2020-07-14 ENCOUNTER — Other Ambulatory Visit: Payer: Self-pay | Admitting: Neurology

## 2020-07-15 NOTE — Telephone Encounter (Signed)
Rx(s) sent to pharmacy electronically.  

## 2020-08-05 NOTE — Progress Notes (Signed)
Virtual Visit Via Video   The purpose of this virtual visit is to provide medical care while limiting exposure to the novel coronavirus.    Consent was obtained for video visit:  Yes.   Answered questions that patient had about telehealth interaction:  Yes.   I discussed the limitations, risks, security and privacy concerns of performing an evaluation and management service by telemedicine. I also discussed with the patient that there may be a patient responsible charge related to this service. The patient expressed understanding and agreed to proceed.  Pt location: Home Physician Location: office Name of referring provider:  Pleas Koch, NP I connected with [name] at patients initiation/request on 08/11/2020 at  8:15 AM EST by video enabled telemedicine application and verified that I am speaking with the correct person using two identifiers. Pt MRN:  [id number] Pt DOB:  Dec 05, 1945 Video Participants:  [name];  Daughter supplements hx Assessment/Plan:   1.  Vascular parkinsonism             -Continue carbidopa/levodopa 25/100, 3 tablets 3 times per day  -pt declines PT.  Encouraged her to rethink that  2.  Depression             -Managed by primary care.  On Lexapro and mirtazapine. 3.  RBD and GAD             -On clonazepam, 0.5 mg, half tablet at bedtime and takes half tablet as needed in the day for anxiety.  I have concerns for this causing falls and EDS.               -get tuck in bed rails for bed.   4.  Headache             -On gabapentin, 100 mg,  3 times per day  -Has seen Dr. Tommi Rumps in the past.  Not for a long time. 5.    MCI             -Patient with neurocognitive testing in August, 2021 with Dr. Nicole Kindred with evidence of MCI and depression.  Subjective:   [name] was seen today in follow up for Parkinsons disease.  My previous records were reviewed prior to todays visit as well as outside records available to me. Pt denies falls  but having some stumbling.  Pt denies lightheadedness, near syncope.  No hallucinations.  Patient had neurocognitive testing since last visit.  That has been reviewed and discussed with Dr. Nicole Kindred.  Evidence of MCI and depression.  She is reading for mental activity but not much physical activity.  Current prescribed movement disorder medications: Carbidopa/levodopa 25/100, 3 tablets 3 times per day Mirtazapine, 30 mg at night Gabapentin, 100 mg 3 times per day Clonazepam, 0.25 mg twice daily    ALLERGIES:   Allergies  Allergen Reactions  . Codeine Sulfate     REACTION: hallucinations  . Penicillins Anaphylaxis, Swelling and Rash  . Wellbutrin [Bupropion] Other (See Comments)    Seizures and hallucinations    CURRENT MEDICATIONS:  Outpatient Encounter Medications as of 08/11/2020  Medication Sig  . aspirin 81 MG tablet Take 81 mg by mouth daily.   Marland Kitchen azithromycin (ZITHROMAX) 250 MG tablet 2 tab po x 1 day then 1 tab po daily, Fill if not improving in next 3-4 days, VOID after 04/29/2020  . benzonatate (TESSALON) 200 MG capsule Take 1 capsule (200 mg total) by mouth 2 (two) times daily as  needed for cough.  Marland Kitchen Bioflavonoid Products (GRAPE SEED PO) Take 1 tablet by mouth daily.   . calcium carbonate (TUMS EX) 750 MG chewable tablet Chew 1 tablet by mouth as needed.   . Calcium Carbonate-Vitamin D (CALCIUM + D PO) Take 600 mg by mouth daily.  . carbidopa-levodopa (SINEMET IR) 25-100 MG tablet TAKE 3 TABLETS BY MOUTH 3 TIMES A DAY  . cetirizine (ZYRTEC) 10 MG tablet Take 10 mg by mouth daily.  . Cholecalciferol (VITAMIN D3) 1000 UNITS CAPS Take 1 capsule by mouth daily.    . clonazePAM (KLONOPIN) 0.5 MG tablet TAKE 1/2 TABLET BY MOUTH AT BEDTIME AND 1/2 DAILY AS NEEDED  . escitalopram (LEXAPRO) 10 MG tablet TAKE 1 TABLET BY MOUTH EVERY DAY  . feeding supplement (ENSURE COMPLETE) LIQD Take 237 mLs by mouth 2 (two) times daily between meals. (Patient taking differently: Take 237 mLs by mouth  once. )  . gabapentin (NEURONTIN) 100 MG capsule TAKE 1 CAPSULE BY MOUTH THREE TIMES A DAY  . losartan-hydrochlorothiazide (HYZAAR) 50-12.5 MG tablet TAKE 1 TABLET BY MOUTH EVERY DAY FOR BLOOD PRESSURE  . mirtazapine (REMERON) 30 MG tablet TAKE 1 TABLET (30 MG TOTAL) BY MOUTH AT BEDTIME. FOR SLEEP.  . Multiple Vitamins-Minerals (CENTRUM SILVER ADULT 50+) TABS Take by mouth.  . naproxen sodium (ALEVE) 220 MG tablet Take 220 mg by mouth in the morning and at bedtime.  . rosuvastatin (CRESTOR) 5 MG tablet Take 1 tablet (5 mg total) by mouth every evening. For cholesterol.  . vitamin B-12 (CYANOCOBALAMIN) 1000 MCG tablet Take 1,000 mcg by mouth daily.    . [DISCONTINUED] simvastatin (ZOCOR) 20 MG tablet TAKE 1 TABLET BY MOUTH EVERY DAY AT BEDTIME FOR CHOLESTEROL   No facility-administered encounter medications on file as of 08/11/2020.    Objective:   PHYSICAL EXAMINATION:    VITALS:  There were no vitals filed for this visit.  GEN:  The patient appears stated age and is in NAD.  Neurological examination:  Orientation: The patient is alert and oriented x3. Cranial nerves: There is good facial symmetry. There is min facial hypomimia.  The speech is fluent and clear. Soft palate rises symmetrically and there is no tongue deviation. Hearing is slightly decreased to conversational tone. Motor: Strength is at least antigravity x 4.   Shoulder shrug is equal and symmetric.  There is no pronator drift.  Movement examination: Tone: unable Abnormal movements: none seen Coordination:  There is no significant decremation with RAM's, in the UE bilaterally.  Unable to see LE Gait and Station: The patient states unable to show me her walk and stay within camera   I have reviewed and interpreted the following labs independently    Chemistry      Component Value Date/Time   NA 141 02/29/2020 0801   K 4.3 02/29/2020 0801   CL 102 02/29/2020 0801   CO2 34 (H) 02/29/2020 0801   BUN 20 02/29/2020  0801   CREATININE 0.71 02/29/2020 0801   CREATININE 0.71 12/14/2019 1504      Component Value Date/Time   CALCIUM 10.5 02/29/2020 0801   ALKPHOS 61 12/15/2018 0900   AST 10 12/14/2019 1504   ALT 3 (L) 12/14/2019 1504   BILITOT 0.5 12/14/2019 1504       Lab Results  Component Value Date   WBC 9.7 12/14/2019   HGB 13.6 12/14/2019   HCT 41.0 12/14/2019   MCV 96.2 12/14/2019   PLT 257 12/14/2019    Lab Results  Component Value Date   TSH 1.04 12/07/2017    Follow up Instructions      -I discussed the assessment and treatment plan with the patient. The patient was provided an opportunity to ask questions and all were answered. The patient agreed with the plan and demonstrated an understanding of the instructions.   The patient was advised to call back or seek an in-person evaluation if the symptoms worsen or if the condition fails to improve as anticipated.    Total time spent on today's visit was 20 minutes, including both face-to-face time and nonface-to-face time.  Time included that spent on review of records (prior notes available to me/labs/imaging if pertinent), discussing treatment and goals, answering patient's questions and coordinating care.   Cc:  Pleas Koch, NP

## 2020-08-11 ENCOUNTER — Other Ambulatory Visit: Payer: Self-pay

## 2020-08-11 ENCOUNTER — Telehealth (INDEPENDENT_AMBULATORY_CARE_PROVIDER_SITE_OTHER): Payer: Medicare Other | Admitting: Neurology

## 2020-08-11 ENCOUNTER — Encounter: Payer: Self-pay | Admitting: Neurology

## 2020-08-11 DIAGNOSIS — G20 Parkinson's disease: Secondary | ICD-10-CM | POA: Diagnosis not present

## 2020-08-26 ENCOUNTER — Telehealth: Payer: Self-pay | Admitting: Primary Care

## 2020-08-26 DIAGNOSIS — M810 Age-related osteoporosis without current pathological fracture: Secondary | ICD-10-CM

## 2020-08-26 NOTE — Telephone Encounter (Signed)
Patient believes she is due for Prolia Injection. Please advise and schedule patient EM

## 2020-08-28 NOTE — Telephone Encounter (Signed)
Pt called checking on the question about the prolia shot

## 2020-09-05 NOTE — Telephone Encounter (Signed)
Called spoke to patient let her know we are just a tad behind on the prolia due to first of the year. That I will send message to make sure that we pull her to get started on this. I will run benefits now.

## 2020-09-17 ENCOUNTER — Other Ambulatory Visit: Payer: Self-pay | Admitting: Neurology

## 2020-09-17 NOTE — Telephone Encounter (Signed)
Called and spoke with patients daughter Kennyth Lose (ok per [facility]) regarding Prolia injection. Informed daughter that it would be $0 OOP. Patient and daughter verbalized understanding. Labs scheduled for 2/25 and NV 3/10.

## 2020-09-17 NOTE — Addendum Note (Signed)
Addended by: Ronna Polio on: 09/17/2020 12:14 PM   Modules accepted: Orders

## 2020-09-19 ENCOUNTER — Other Ambulatory Visit: Payer: Self-pay

## 2020-09-19 ENCOUNTER — Other Ambulatory Visit (INDEPENDENT_AMBULATORY_CARE_PROVIDER_SITE_OTHER): Payer: Medicare Other

## 2020-09-19 DIAGNOSIS — M810 Age-related osteoporosis without current pathological fracture: Secondary | ICD-10-CM

## 2020-09-19 LAB — BASIC METABOLIC PANEL
BUN: 22 mg/dL (ref 6–23)
CO2: 34 mEq/L — ABNORMAL HIGH (ref 19–32)
Calcium: 11 mg/dL — ABNORMAL HIGH (ref 8.4–10.5)
Chloride: 100 mEq/L (ref 96–112)
Creatinine, Ser: 0.75 mg/dL (ref 0.40–1.20)
GFR: 77.71 mL/min (ref >60.00)
Glucose, Bld: 142 mg/dL — ABNORMAL HIGH (ref 70–99)
Potassium: 4.7 mEq/L (ref 3.5–5.1)
Sodium: 142 mEq/L (ref 135–145)

## 2020-10-01 ENCOUNTER — Telehealth: Payer: Self-pay

## 2020-10-01 NOTE — Telephone Encounter (Signed)
Per Allie Bossier, NP patients calcium is high and NP recommended holding off on Prolia injection at this time (refer to result note). Called and spoke with patients daughter Geni Bers and informed her D/T elevated calcium, Anda Kraft wants her to hold off on getting the injection at this point. NV cancelled. Patients daughter voiced understanding.

## 2020-10-01 NOTE — Telephone Encounter (Signed)
Per [name] Pt's Ca is elevated and she wants pt to hold getting the prolia inj right now. CrCl is 76.12 mL/min.  Per Anda Kraft, Calcium level is too high. Not sure what the protocol is, but would refrain from her Prolia injection at this time.Calcium level is too high. Not sure what the protocol is, but would refrain from her Prolia injection at this time.

## 2020-10-02 ENCOUNTER — Ambulatory Visit: Payer: Medicare Other

## 2020-10-13 ENCOUNTER — Other Ambulatory Visit: Payer: Self-pay | Admitting: Neurology

## 2020-11-05 ENCOUNTER — Encounter: Payer: Self-pay | Admitting: Primary Care

## 2020-11-05 ENCOUNTER — Other Ambulatory Visit: Payer: Self-pay

## 2020-11-05 ENCOUNTER — Ambulatory Visit (INDEPENDENT_AMBULATORY_CARE_PROVIDER_SITE_OTHER): Payer: Medicare Other | Admitting: Primary Care

## 2020-11-05 VITALS — BP 160/84 | HR 70 | Temp 97.6°F | Ht 61.0 in | Wt 143.0 lb

## 2020-11-05 DIAGNOSIS — G214 Vascular parkinsonism: Secondary | ICD-10-CM

## 2020-11-05 DIAGNOSIS — Z1231 Encounter for screening mammogram for malignant neoplasm of breast: Secondary | ICD-10-CM | POA: Diagnosis not present

## 2020-11-05 DIAGNOSIS — J309 Allergic rhinitis, unspecified: Secondary | ICD-10-CM

## 2020-11-05 DIAGNOSIS — E782 Mixed hyperlipidemia: Secondary | ICD-10-CM | POA: Diagnosis not present

## 2020-11-05 DIAGNOSIS — Z0000 Encounter for general adult medical examination without abnormal findings: Secondary | ICD-10-CM | POA: Diagnosis not present

## 2020-11-05 DIAGNOSIS — E119 Type 2 diabetes mellitus without complications: Secondary | ICD-10-CM | POA: Diagnosis not present

## 2020-11-05 DIAGNOSIS — K219 Gastro-esophageal reflux disease without esophagitis: Secondary | ICD-10-CM

## 2020-11-05 DIAGNOSIS — M810 Age-related osteoporosis without current pathological fracture: Secondary | ICD-10-CM | POA: Diagnosis not present

## 2020-11-05 DIAGNOSIS — F341 Dysthymic disorder: Secondary | ICD-10-CM

## 2020-11-05 DIAGNOSIS — I10 Essential (primary) hypertension: Secondary | ICD-10-CM | POA: Diagnosis not present

## 2020-11-05 LAB — COMPREHENSIVE METABOLIC PANEL
ALT: 6 U/L (ref 0–35)
AST: 11 U/L (ref 0–37)
Albumin: 3.9 g/dL (ref 3.5–5.2)
Alkaline Phosphatase: 67 U/L (ref 39–117)
BUN: 18 mg/dL (ref 6–23)
CO2: 33 mEq/L — ABNORMAL HIGH (ref 19–32)
Calcium: 10.2 mg/dL (ref 8.4–10.5)
Chloride: 101 mEq/L (ref 96–112)
Creatinine, Ser: 0.61 mg/dL (ref 0.40–1.20)
GFR: 87.18 mL/min (ref >60.00)
Glucose, Bld: 118 mg/dL — ABNORMAL HIGH (ref 70–99)
Potassium: 4 mEq/L (ref 3.5–5.1)
Sodium: 141 mEq/L (ref 135–145)
Total Bilirubin: 0.6 mg/dL (ref 0.2–1.2)
Total Protein: 6.9 g/dL (ref 6.0–8.3)

## 2020-11-05 LAB — LIPID PANEL
Cholesterol: 108 mg/dL (ref 0–200)
HDL: 46.3 mg/dL (ref >39.00)
LDL Cholesterol: 29 mg/dL (ref 0–99)
NonHDL: 61.82
Total CHOL/HDL Ratio: 2
Triglycerides: 163 mg/dL — ABNORMAL HIGH (ref 0.0–149.0)
VLDL: 32.6 mg/dL (ref 0.0–40.0)

## 2020-11-05 LAB — CBC
HCT: 41.1 % (ref 36.0–46.0)
Hemoglobin: 13.7 g/dL (ref 12.0–15.0)
MCHC: 33.3 g/dL (ref 30.0–36.0)
MCV: 93.9 fl (ref 78.0–100.0)
Platelets: 205 10*3/uL (ref 150.0–400.0)
RBC: 4.37 Mil/uL (ref 3.87–5.11)
RDW: 14 % (ref 11.5–15.5)
WBC: 7.9 10*3/uL (ref 4.0–10.5)

## 2020-11-05 LAB — POCT GLYCOSYLATED HEMOGLOBIN (HGB A1C): Hemoglobin A1C: 6.9 % — AB (ref 4.0–5.6)

## 2020-11-05 MED ORDER — MONTELUKAST SODIUM 10 MG PO TABS: 10.0000 mg | ORAL_TABLET | Freq: Every day | ORAL | 0 refills | Status: DC

## 2020-11-05 NOTE — Assessment & Plan Note (Signed)
Doing well on Lexapro 10 mg and PRN clonazepam 0.5 mg per neurology. Continue same.

## 2020-11-05 NOTE — Patient Instructions (Signed)
Stop by the lab prior to leaving today. I will notify you of your results once received.   You can try taking montelukast (Singulair) 10 mg at bedtime for allergies.   Increase your water intake to at least 4 bottles daily.  [facility] to schedule your mamm[facility].   It was a pleasure to see you today!   Preventive Care 7 Years and Older, Female Preventive care refers to lifestyle choices and visits with your health care provider that can promote health and wellness. This includes:  A yearly physical exam. This is also called an annual wellness visit.  Regular dental and eye exams.  Immunizations.  Screening for certain conditions.  Healthy lifestyle choices, such as: ? Eating a healthy diet. ? Getting regular exercise. ? Not using drugs or products that contain nicotine and tobacco. ? Limiting alcohol use. What can I expect for my preventive care visit? Physical exam Your health care provider will check your:  Height and weight. These may be used to calculate your BMI (body mass index). BMI is a measurement that tells if you are at a healthy weight.  Heart rate and blood pressure.  Body temperature.  Skin for abnormal spots. Counseling Your health care provider may ask you questions about your:  Past medical problems.  Family's medical history.  Alcohol, tobacco, and drug use.  Emotional well-being.  Home life and relationship well-being.  Sexual activity.  Diet, exercise, and sleep habits.  History of falls.  Memory and ability to understand (cognition).  Work and work Statistician.  Pregnancy and menstrual history.  Access to firearms. What immunizations do I need? Vaccines are usually given at various ages, according to a schedule. Your health care provider will recommend vaccines for you based on your age, medical history, and lifestyle or other factors, such as travel or where you work.   What tests do I need? Blood tests  Lipid and  cholesterol levels. These may be checked every 5 years, or more often depending on your overall health.  Hepatitis C test.  Hepatitis B test. Screening  Lung cancer screening. You may have this screening every year starting at age 76 if you have a 30-pack-year history of smoking and currently smoke or have quit within the past 15 years.  Colorectal cancer screening. ? All adults should have this screening starting at age 76 and continuing until age 76. ? Your health care provider may recommend screening at age 76 if you are at increased risk. ? You will have tests every 1-10 years, depending on your results and the type of screening test.  Diabetes screening. ? This is done by checking your blood sugar (glucose) after you have not eaten for a while (fasting). ? You may have this done every 1-3 years.  Mammogram. ? This may be done every 1-2 years. ? Talk with your health care provider about how often you should have regular mammograms.  Abdominal aortic aneurysm (AAA) screening. You may need this if you are a current or former smoker.  BRCA-related cancer screening. This may be done if you have a family history of breast, ovarian, tubal, or peritoneal cancers. Other tests  STD (sexually transmitted disease) testing, if you are at risk.  Bone density scan. This is done to screen for osteoporosis. You may have this done starting at age 76. Talk with your health care provider about your test results, treatment options, and if necessary, the need for more tests. Follow these instructions at home:  Eating and drinking  Eat a diet that includes fresh fruits and vegetables, whole grains, lean protein, and low-fat dairy products. Limit your intake of foods with high amounts of sugar, saturated fats, and salt.  Take vitamin and mineral supplements as recommended by your health care provider.  Do not drink alcohol if your health care provider tells you not to drink.  If you drink  alcohol: ? Limit how much you have to 0-1 drink a day. ? Be aware of how much alcohol is in your drink. In the [place] one drink equals one 12 oz bottle of beer (355 mL), one 5 oz glass of wine (148 mL), or one 1 oz glass of hard liquor (44 mL).   Lifestyle  Take daily care of your teeth and gums. Brush your teeth every morning and night with fluoride toothpaste. Floss one time each day.  Stay active. Exercise for at least 30 minutes 5 or more days each week.  Do not use any products that contain nicotine or tobacco, such as cigarettes, e-cigarettes, and chewing tobacco. If you need help quitting, ask your health care provider.  Do not use drugs.  If you are sexually active, practice safe sex. Use a condom or other form of protection in order to prevent STIs (sexually transmitted infections).  Talk with your health care provider about taking a low-dose aspirin or statin.  Find healthy ways to cope with stress, such as: ? Meditation, yoga, or listening to music. ? Journaling. ? Talking to a trusted person. ? Spending time with friends and family. Safety  Always wear your seat belt while driving or riding in a vehicle.  Do not drive: ? If you have been drinking alcohol. Do not ride with someone who has been drinking. ? When you are tired or distracted. ? While texting.  Wear a helmet and other protective equipment during sports activities.  If you have firearms in your house, make sure you follow all gun safety procedures. What's next?  Visit your health care provider once a year for an annual wellness visit.  Ask your health care provider how often you should have your eyes and teeth checked.  Stay up to date on all vaccines. This information is not intended to replace advice given to you by your health care provider. Make sure you discuss any questions you have with your health care provider. Document Revised: 07/02/2020 Document Reviewed: 07/06/2018 Elsevier Patient  Education  2021 [employer].

## 2020-11-05 NOTE — Progress Notes (Signed)
Subjective:    Patient ID: [name], female    DOB: [birth year], 75 y.o.   MRN: [id number]  HPI  [name] is a very pleasant 75 y.o. female who presents today for complete physical.  Her daughter is with her today who is providing some of the HPI information. She mentions that her mother is reporting rhinorrhea, post nasal drip, watery eyes. She's been taking Zyrtec without improvement. Her daughter is on Singulair and does well, she would like to request a prescription for her mother.    Immunizations: -Tetanus: 2013 -Influenza: Completed this season  -Covid-19: Completed three doses -Shingles: Completed Zostavax and Shingrix -Pneumonia: Prevnar in 2015, Pneumovax in 2011  Diet: She endorses a fair diet.  Exercise: No regular exercise  Eye exam: Completes annually  Dental exam: Completes annually  Mammogram: 2021 Dexa: 2021 Colonoscopy: Completed Cologuard in 2020   BP Readings from Last 3 Encounters:  11/05/20 (!) 160/84  02/07/20 116/80  12/14/19 116/76      Review of Systems  Constitutional: Negative for unexpected weight change.  HENT: Positive for postnasal drip, rhinorrhea and sneezing.   Eyes: Negative for visual disturbance.  Respiratory: Negative for cough and shortness of breath.   Cardiovascular: Negative for chest pain.  Gastrointestinal: Positive for constipation. Negative for diarrhea.  Genitourinary: Negative for difficulty urinating.  Musculoskeletal: Negative for arthralgias.  Skin: Negative for rash.  Allergic/Immunologic: Positive for environmental allergies.  Neurological: Negative for dizziness, numbness and headaches.  Psychiatric/Behavioral: The patient is not nervous/anxious.          Past Medical History:  Diagnosis Date  . Adjustment disorder with depressed mood   . Anxiety   . Bell's palsy    1985  . Dementia   . Depressive disorder, not elsewhere classified   . 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 DVT (deep venous thrombosis), left "early 2000's"   RLE  . Dysmetabolic syndrome X   . Essential hypertension, benign   . [facility](947.0) 05/2011   "pretty often since they put me on Parkinson's medicine"  . History of shingles    "as a teen and in my 70's"  . Mixed hyperlipidemia   . Morbid obesity ([place])   . Neurodegenerative gait disorder   . Personal history of thrombophlebitis   . Syncope and collapse 12/06/11   "this am; I pass out fairly often"  . Type II or unspecified type diabetes mellitus without mention of complication, not stated as uncontrolled 12/06/11   "not anymore; had lap band OR"    Social History   Socioeconomic History  . Marital status: Widowed    Spouse name: Not on file  . Number of children: 2  . Years of education: Not on file  . Highest education level: Some college, no degree  Occupational History  . 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 Smoking status: Never Smoker  . Smokeless tobacco: Never Used  Vaping Use  . Vaping Use: Never used  Substance and Sexual Activity  . Alcohol use: No    Comment: none  . Drug use: No  . Sexual activity: Not Currently  Other Topics Concern  . Not on file  Social History Narrative   Right handed   One story home   Lives with daughter and granddaughter.   Daughter is HPOA.   DNR.   Social  Determinants of Health   Financial Resource Strain: Low Risk   . Difficulty of Paying Living Expenses: Not hard at all  Food Insecurity: No Food Insecurity  . Worried About Charity fundraiser in the Last Year: Never true  . Ran Out of Food in the Last Year: Never true  Transportation Needs: No Transportation Needs  . Lack of Transportation (Medical): No  . Lack of Transportation (Non-Medical): No  Physical Activity: Inactive  . Days of Exercise per Week: 0 days  . Minutes of Exercise per Session: 0 min  Stress: No Stress Concern  Present  . Feeling of Stress : Not at all  Social Connections: Not on file  Intimate Partner Violence: Not At Risk  . Fear of Current or Ex-Partner: No  . Emotionally Abused: No  . Physically Abused: No  . Sexually Abused: No    Past Surgical History:  Procedure Laterality Date  . bladder tack  11/1995  . BREAST BIOPSY  05/20/2000   bilaterally  . BREAST CYST ASPIRATION  12/1998   bilaterally  . CARDIAC CATHETERIZATION  2004  . CATARACT EXTRACTION W/ INTRAOCULAR LENS  IMPLANT, BILATERAL    . FRACTURE SURGERY  02/09/2006   bilateral elbows  . FRACTURE SURGERY     right knee  . FRACTURE SURGERY     tib plateau  . KNEE ARTHROSCOPY  12/12/2002   right   . LAPAROSCOPIC GASTRIC BANDING  2008  . SEPTOPLASTY  08/1998   with antral window   . TONSILLECTOMY AND ADENOIDECTOMY  1970  . TUBAL LIGATION  1970's    Family History  Problem Relation Age of Onset  . Asthma Mother   . COPD Father   . Heart disease Father   . Diabetes Sister   . Vision loss Sister        legally blind  . Hypertension Child   . Hyperlipidemia Child   . Diabetes Child   . Diabetes Son   . Breast cancer Neg Hx     Allergies  Allergen Reactions  . Codeine Sulfate     REACTION: hallucinations  . Penicillins Anaphylaxis, Swelling and Rash  . Wellbutrin [Bupropion] Other (See Comments)    Seizures and hallucinations    Current Outpatient Medications on File Prior to Visit  Medication Sig Dispense Refill  . aspirin 81 MG tablet Take 81 mg by mouth daily.    Marland Kitchen Bioflavonoid Products (GRAPE SEED PO) Take 1 tablet by mouth daily.     . calcium carbonate (TUMS EX) 750 MG chewable tablet Chew 1 tablet by mouth as needed.     . Calcium Carbonate-Vitamin D (CALCIUM + D PO) Take 600 mg by mouth daily.    . carbidopa-levodopa (SINEMET IR) 25-100 MG tablet TAKE 3 TABLETS BY MOUTH 3 TIMES A DAY 810 tablet 0  . cetirizine (ZYRTEC) 10 MG tablet Take 10 mg by mouth daily.    . Cholecalciferol (VITAMIN D3) 1000  UNITS CAPS Take 1 capsule by mouth daily.    . clonazePAM (KLONOPIN) 0.5 MG tablet TAKE 1/2 TABLET BY MOUTH AT BEDTIME AND 1/2 DAILY AS NEEDED 90 tablet 1  . escitalopram (LEXAPRO) 10 MG tablet TAKE 1 TABLET BY MOUTH EVERY DAY 90 tablet 2  . feeding supplement (ENSURE COMPLETE) LIQD Take 237 mLs by mouth 2 (two) times daily between meals. (Patient taking differently: Take 237 mLs by mouth once.) 1 Bottle 11  . gabapentin (NEURONTIN) 100 MG capsule TAKE 1 CAPSULE BY MOUTH THREE TIMES  A DAY 270 capsule 0  . losartan-hydrochlorothiazide (HYZAAR) 50-12.5 MG tablet TAKE 1 TABLET BY MOUTH EVERY DAY FOR BLOOD PRESSURE 90 tablet 3  . mirtazapine (REMERON) 30 MG tablet TAKE 1 TABLET (30 MG TOTAL) BY MOUTH AT BEDTIME. FOR SLEEP. 90 tablet 1  . Multiple Vitamins-Minerals (CENTRUM SILVER ADULT 50+) TABS Take by mouth.    . naproxen sodium (ALEVE) 220 MG tablet Take 220 mg by mouth in the morning and at bedtime.    . rosuvastatin (CRESTOR) 5 MG tablet Take 1 tablet (5 mg total) by mouth every evening. For cholesterol. 90 tablet 3  . vitamin B-12 (CYANOCOBALAMIN) 1000 MCG tablet Take 1,000 mcg by mouth daily.    . fluticasone (FLONASE) 50 MCG/ACT nasal spray Place 2 sprays into both nostrils daily.    . [DISCONTINUED] simvastatin (ZOCOR) 20 MG tablet TAKE 1 TABLET BY MOUTH EVERY DAY AT BEDTIME FOR CHOLESTEROL 90 tablet 0   No current facility-administered medications on file prior to visit.    BP (!) 160/84   Pulse 70   Temp 97.6 F (36.4 C) (Temporal)   Ht 5\' 1"  (1.549 m)   Wt 143 lb (64.9 kg)   SpO2 100%   BMI 27.02 kg/m  Objective:   Physical Exam HENT:     Right Ear: Tympanic membrane and ear canal normal.     Left Ear: Tympanic membrane and ear canal normal.     Nose: Nose normal.  Eyes:     Conjunctiva/sclera: Conjunctivae normal.     Pupils: Pupils are equal, round, and reactive to light.  Neck:     Thyroid: No thyromegaly.  Cardiovascular:     Rate and Rhythm: Normal rate and regular  rhythm.     Heart sounds: No murmur heard.   Pulmonary:     Effort: Pulmonary effort is normal.     Breath sounds: Normal breath sounds. No rales.  Abdominal:     General: Bowel sounds are normal.     Palpations: Abdomen is soft.     Tenderness: There is no abdominal tenderness.  Musculoskeletal:        General: Normal range of motion.     Cervical back: Neck supple.  Lymphadenopathy:     Cervical: No cervical adenopathy.  Skin:    General: Skin is warm and dry.     Findings: No rash.  Neurological:     Mental Status: She is alert and oriented to person, place, and time.     Cranial Nerves: No cranial nerve deficit.     Deep Tendon Reflexes: Reflexes are normal and symmetric.  Psychiatric:        Mood and Affect: Mood normal.           Assessment & Plan:      This visit occurred during the SARS-CoV-2 public health emergency.  Safety protocols were in place, including screening questions prior to the visit, additional usage of staff PPE, and extensive cleaning of exam room while observing appropriate contact time as indicated for disinfecting solutions.

## 2020-11-05 NOTE — Assessment & Plan Note (Signed)
Above goal in the office today, patient has not had medications today.   Continue Hyzaar 50-12.5 mg daily.  CMP pending today.

## 2020-11-05 NOTE — Assessment & Plan Note (Signed)
Immunizations UTD. Mammogram due and ordered. Colon cancer screening UTD. Bone density scan UTD.  Discussed the importance of a healthy diet and regular exercise in order for weight loss, and to reduce the risk of any potential medical problems.  Exam today stable. Labs pending.

## 2020-11-05 NOTE — Assessment & Plan Note (Signed)
Recommended to work on diet, increase activity level. Continue Crestor 5 mg. Repeat lipid pending.

## 2020-11-05 NOTE — Assessment & Plan Note (Signed)
Did not complete Prolia injection due to elevated calcium level in February 2022.  She continues to take oral calcium. Repeat calcium level pending today.  Bone density scan UTD.

## 2020-11-05 NOTE — Assessment & Plan Note (Signed)
Overall stable, using Tums sparingly. Continue same.

## 2020-11-05 NOTE — Assessment & Plan Note (Signed)
Following with neurology, continue Carbidopa-levodopa 25-100 mg per Dr. Carles Collet.

## 2020-11-05 NOTE — Assessment & Plan Note (Signed)
Controlled with A1C today of 6.9. We did discuss the need to work on diet and regular activity, increase water intake.  Continue off medications. Follow up in 6 months.

## 2020-11-13 NOTE — Telephone Encounter (Signed)
[name], I pulled down lab for future draw but was not sure which DX code you wanted to use. Thank you

## 2020-11-13 NOTE — Telephone Encounter (Signed)
Spoke with patient's daughter, ok per DPR on file. Patient scheduled for NV on 11/27/20 and re check lab on 12/29/20.

## 2020-11-13 NOTE — Telephone Encounter (Signed)
Yes, okay to reschedule for Prolia injection. Would like to repeat calcium one month after her injection, can we set up a lab appointment for one month after injection?

## 2020-11-13 NOTE — Telephone Encounter (Signed)
[name], wanted to follow up on the patient and to see if it is ok to proceed with Prolia injection now. Calcium was elevated in February 2022. Labs re checked on 11/04/20 and Calcium is now normal at 10.2 and Creatinine clearance is 81.64. Let me know if ok to reschedule. Thank you

## 2020-11-13 NOTE — Addendum Note (Signed)
Addended by: Kris Mouton on: 11/13/2020 03:53 PM   Modules accepted: Orders

## 2020-11-13 NOTE — Addendum Note (Signed)
Addended by: Pleas Koch on: 11/13/2020 04:38 PM   Modules accepted: Orders

## 2020-11-13 NOTE — Telephone Encounter (Signed)
Thanks, lab ordered.

## 2020-11-27 ENCOUNTER — Ambulatory Visit (INDEPENDENT_AMBULATORY_CARE_PROVIDER_SITE_OTHER): Payer: Medicare Other

## 2020-11-27 ENCOUNTER — Other Ambulatory Visit: Payer: Self-pay

## 2020-11-27 DIAGNOSIS — M810 Age-related osteoporosis without current pathological fracture: Secondary | ICD-10-CM | POA: Diagnosis not present

## 2020-11-27 MED ORDER — DENOSUMAB 60 MG/ML ~~LOC~~ SOSY
60.0000 mg | PREFILLED_SYRINGE | Freq: Once | SUBCUTANEOUS | Status: AC
  Administered 2020-11-27: 60 mg via SUBCUTANEOUS

## 2020-11-27 MED ORDER — DENOSUMAB 60 MG/ML ~~LOC~~ SOSY: 60.0000 mg | PREFILLED_SYRINGE | Freq: Once | SUBCUTANEOUS | 0 refills | Status: AC

## 2020-11-27 NOTE — Progress Notes (Addendum)
Per orders of Dr. Waunita Schooner , injection of Prolia  given by Francella Solian in right arm subcutaneous . Patient tolerated injection well.Will be contacted by our office when due for next injection.

## 2020-11-28 ENCOUNTER — Telehealth: Payer: Self-pay

## 2020-11-28 NOTE — Telephone Encounter (Signed)
[name] (DPR signed) said got call from [facility] about the prolia being there and ready for pick up; [name] said that pt received a prolia injection on 11/27/20 at [facility] and the prolia shot was provided by our office as usual. Pt has never had to pick up prolia at pharmacy. [name] request cb. Sending note to Anastasiya.

## 2020-11-28 NOTE — Telephone Encounter (Signed)
[facility] Per chart RX for Prolia was sent in by the provider in error, not sure why that was done that way. I have cancelled this with the pharmacies. I called [name] and left message advising her of this information.

## 2020-12-03 ENCOUNTER — Other Ambulatory Visit: Payer: Self-pay

## 2020-12-03 ENCOUNTER — Encounter: Payer: Self-pay | Admitting: Family Medicine

## 2020-12-03 ENCOUNTER — Ambulatory Visit (INDEPENDENT_AMBULATORY_CARE_PROVIDER_SITE_OTHER): Payer: Medicare Other | Admitting: Family Medicine

## 2020-12-03 VITALS — BP 122/84 | HR 58 | Temp 97.2°F | Wt 160.0 lb

## 2020-12-03 DIAGNOSIS — N3000 Acute cystitis without hematuria: Secondary | ICD-10-CM | POA: Diagnosis not present

## 2020-12-03 DIAGNOSIS — R300 Dysuria: Secondary | ICD-10-CM

## 2020-12-03 LAB — POC URINALSYSI DIPSTICK (AUTOMATED)
Bilirubin, UA: NEGATIVE
Blood, UA: POSITIVE
Glucose, UA: NEGATIVE
Nitrite, UA: NEGATIVE
Protein, UA: POSITIVE — AB
Spec Grav, UA: 1.025 (ref 1.010–1.025)
Urobilinogen, UA: 0.2 E.U./dL
pH, UA: 6 (ref 5.0–8.0)

## 2020-12-03 MED ORDER — SULFAMETHOXAZOLE-TRIMETHOPRIM 800-160 MG PO TABS: 1.0000 | ORAL_TABLET | Freq: Two times a day (BID) | ORAL | 0 refills | Status: AC

## 2020-12-03 NOTE — Progress Notes (Signed)
   Subjective:     [name] is a 75 y.o. female presenting for Dysuria (X 1 week ) and Urinary Frequency     Dysuria  This is a new problem. The current episode started in the past 7 days. The problem occurs every urination. The problem has been gradually worsening. The quality of the pain is described as burning. There has been no fever. Associated symptoms include frequency and hesitancy. Pertinent negatives include no flank pain, nausea, urgency or vomiting. She has tried increased fluids (cranberry juice) for the symptoms. The treatment provided mild relief.  Urinary Frequency  Associated symptoms include frequency and hesitancy. Pertinent negatives include no flank pain, nausea, urgency or vomiting.      Review of Systems  Gastrointestinal: Negative for nausea and vomiting.  Genitourinary: Positive for dysuria, frequency and hesitancy. Negative for flank pain and urgency.     Social History   Tobacco Use  Smoking Status Never Smoker  Smokeless Tobacco Never Used        Objective:    BP Readings from Last 3 Encounters:  12/03/20 122/84  11/05/20 (!) 160/84  02/07/20 116/80   Wt Readings from Last 3 Encounters:  12/03/20 160 lb (72.6 kg)  11/05/20 143 lb (64.9 kg)  02/07/20 164 lb (74.4 kg)    BP 122/84   Pulse (!) 58   Temp (!) 97.2 F (36.2 C) (Temporal)   Wt 160 lb (72.6 kg)   SpO2 99%   BMI 30.23 kg/m    Physical Exam Constitutional:      General: She is not in acute distress.    Appearance: She is well-developed. She is not diaphoretic.  HENT:     Head: Normocephalic and atraumatic.  Eyes:     Conjunctiva/sclera: Conjunctivae normal.  Cardiovascular:     Rate and Rhythm: Normal rate and regular rhythm.     Heart sounds: Normal heart sounds.  Pulmonary:     Effort: Pulmonary effort is normal.  Abdominal:     General: Bowel sounds are normal. There is no distension.     Palpations: Abdomen is soft.     Tenderness: There is no  abdominal tenderness. There is no right CVA tenderness, left CVA tenderness or guarding.  Musculoskeletal:     Cervical back: Neck supple.  Skin:    General: Skin is warm and dry.  Neurological:     Mental Status: She is alert.    UA: +LE, neg nitrites, +protein       Assessment & Plan:   Problem List Items Addressed This Visit   None   Visit Diagnoses    Acute cystitis without hematuria    -  Primary   Relevant Medications   sulfamethoxazole-trimethoprim (BACTRIM DS) 800-160 MG tablet   Other Relevant Orders   Urine Culture   Dysuria       Relevant Orders   POCT Urinalysis Dipstick (Automated) (Completed)     UA consistent with prior + urine with infection Previously treated with bactrim will do the same Mychart with culture results    Return if symptoms worsen or fail to improve.  Lesleigh Noe, MD  This visit occurred during the SARS-CoV-2 public health emergency.  Safety protocols were in place, including screening questions prior to the visit, additional usage of staff PPE, and extensive cleaning of exam room while observing appropriate contact time as indicated for disinfecting solutions.

## 2020-12-03 NOTE — Patient Instructions (Signed)
Drink lots of water  Take the antibiotic Culture will be back in a couple of days and available on Mychart

## 2020-12-04 LAB — URINE CULTURE
MICRO NUMBER:: 11877243
SPECIMEN QUALITY:: ADEQUATE

## 2020-12-08 ENCOUNTER — Encounter: Payer: Self-pay | Admitting: Family Medicine

## 2020-12-16 NOTE — Telephone Encounter (Signed)
Daughter Geni Bers called and left VM at Triage pt is still getting auto call saying Prolia Rx was sent [facility] and [facility]. She thought this was taken care of[facility]  And that I would need to call them directly and cancel Rx. Called [phone] and spoke with pharmacist and Prolia inj was still in their sxs. They cancelled the Rx and then verified it was cancelled and also stopped pt from getting auto calls from them.   Called daughter Geni Bers and no answer so left VM letting her know that it was taken care of and she doesn't need to get it from pharmacy and that I cancelled Rx with pharmacy

## 2020-12-29 ENCOUNTER — Other Ambulatory Visit: Payer: Medicare Other

## 2020-12-31 ENCOUNTER — Other Ambulatory Visit: Payer: Self-pay | Admitting: Neurology

## 2020-12-31 ENCOUNTER — Other Ambulatory Visit: Payer: Self-pay | Admitting: Primary Care

## 2020-12-31 DIAGNOSIS — E782 Mixed hyperlipidemia: Secondary | ICD-10-CM

## 2021-01-12 ENCOUNTER — Other Ambulatory Visit: Payer: Self-pay

## 2021-01-12 ENCOUNTER — Encounter (HOSPITAL_COMMUNITY): Payer: Self-pay | Admitting: Student

## 2021-01-12 ENCOUNTER — Observation Stay (HOSPITAL_COMMUNITY)
Admission: EM | Admit: 2021-01-12 | Discharge: 2021-01-13 | Disposition: A | Discharge status: Home / Self Care | Payer: Medicare Other | Attending: Family Medicine | Admitting: Family Medicine

## 2021-01-12 ENCOUNTER — Emergency Department (HOSPITAL_COMMUNITY): Payer: Medicare Other

## 2021-01-12 DIAGNOSIS — R7989 Other specified abnormal findings of blood chemistry: Secondary | ICD-10-CM

## 2021-01-12 DIAGNOSIS — I10 Essential (primary) hypertension: Secondary | ICD-10-CM | POA: Diagnosis present

## 2021-01-12 DIAGNOSIS — Z20822 Contact with and (suspected) exposure to covid-19: Secondary | ICD-10-CM | POA: Diagnosis not present

## 2021-01-12 DIAGNOSIS — E119 Type 2 diabetes mellitus without complications: Secondary | ICD-10-CM | POA: Insufficient documentation

## 2021-01-12 DIAGNOSIS — R55 Syncope and collapse: Secondary | ICD-10-CM | POA: Diagnosis not present

## 2021-01-12 DIAGNOSIS — G20 Parkinson's disease: Secondary | ICD-10-CM | POA: Insufficient documentation

## 2021-01-12 DIAGNOSIS — Z743 Need for continuous supervision: Secondary | ICD-10-CM | POA: Diagnosis not present

## 2021-01-12 DIAGNOSIS — E860 Dehydration: Secondary | ICD-10-CM | POA: Insufficient documentation

## 2021-01-12 DIAGNOSIS — F0390 Unspecified dementia without behavioral disturbance: Secondary | ICD-10-CM | POA: Diagnosis not present

## 2021-01-12 DIAGNOSIS — Z7982 Long term (current) use of aspirin: Secondary | ICD-10-CM | POA: Insufficient documentation

## 2021-01-12 DIAGNOSIS — R7402 Elevation of levels of lactic acid dehydrogenase (LDH): Secondary | ICD-10-CM | POA: Insufficient documentation

## 2021-01-12 DIAGNOSIS — G214 Vascular parkinsonism: Secondary | ICD-10-CM | POA: Diagnosis present

## 2021-01-12 DIAGNOSIS — R531 Weakness: Secondary | ICD-10-CM | POA: Diagnosis not present

## 2021-01-12 DIAGNOSIS — Z79899 Other long term (current) drug therapy: Secondary | ICD-10-CM | POA: Diagnosis not present

## 2021-01-12 DIAGNOSIS — R0902 Hypoxemia: Secondary | ICD-10-CM | POA: Diagnosis not present

## 2021-01-12 DIAGNOSIS — R404 Transient alteration of awareness: Secondary | ICD-10-CM | POA: Diagnosis not present

## 2021-01-12 DIAGNOSIS — Z0389 Encounter for observation for other suspected diseases and conditions ruled out: Secondary | ICD-10-CM | POA: Diagnosis not present

## 2021-01-12 DIAGNOSIS — R6889 Other general symptoms and signs: Secondary | ICD-10-CM | POA: Diagnosis not present

## 2021-01-12 DIAGNOSIS — R42 Dizziness and giddiness: Secondary | ICD-10-CM | POA: Diagnosis present

## 2021-01-12 DIAGNOSIS — R001 Bradycardia, unspecified: Secondary | ICD-10-CM | POA: Diagnosis not present

## 2021-01-12 LAB — LACTIC ACID, PLASMA
Lactic Acid, Venous: 3.6 mmol/L (ref 0.5–1.9)
Lactic Acid, Venous: 4.2 mmol/L (ref 0.5–1.9)
Lactic Acid, Venous: 2.1 mmol/L (ref 0.5–1.9)

## 2021-01-12 LAB — COMPREHENSIVE METABOLIC PANEL
ALT: 5 U/L (ref 0–44)
AST: 18 U/L (ref 15–41)
Albumin: 3.9 g/dL (ref 3.5–5.0)
Alkaline Phosphatase: 60 U/L (ref 38–126)
Anion gap: 10 (ref 5–15)
BUN: 19 mg/dL (ref 8–23)
CO2: 27 mmol/L (ref 22–32)
Calcium: 9.3 mg/dL (ref 8.9–10.3)
Chloride: 102 mmol/L (ref 98–111)
Creatinine, Ser: 0.72 mg/dL (ref 0.44–1.00)
GFR, Estimated: >60 mL/min (ref >60)
Glucose, Bld: 213 mg/dL — ABNORMAL HIGH (ref 70–99)
Potassium: 4.8 mmol/L (ref 3.5–5.1)
Sodium: 139 mmol/L (ref 135–145)
Total Bilirubin: 0.3 mg/dL (ref 0.3–1.2)
Total Protein: 7.1 g/dL (ref 6.5–8.1)

## 2021-01-12 LAB — CK: Total CK: 30 U/L — ABNORMAL LOW (ref 38–234)

## 2021-01-12 LAB — URINALYSIS, ROUTINE W REFLEX MICROSCOPIC
Bilirubin Urine: NEGATIVE
Glucose, UA: NEGATIVE mg/dL
Hgb urine dipstick: NEGATIVE
Ketones, ur: 5 mg/dL — AB
Leukocytes,Ua: NEGATIVE
Nitrite: NEGATIVE
Protein, ur: NEGATIVE mg/dL
Specific Gravity, Urine: 1.018 (ref 1.005–1.030)
pH: 6 (ref 5.0–8.0)

## 2021-01-12 LAB — CBC WITH DIFFERENTIAL/PLATELET
Abs Immature Granulocytes: 0.05 10*3/uL (ref 0.00–0.07)
Basophils Absolute: 0.1 10*3/uL (ref 0.0–0.1)
Basophils Relative: 1 %
Eosinophils Absolute: 0.2 10*3/uL (ref 0.0–0.5)
Eosinophils Relative: 1 %
HCT: 43.6 % (ref 36.0–46.0)
Hemoglobin: 13.8 g/dL (ref 12.0–15.0)
Immature Granulocytes: 1 %
Lymphocytes Relative: 9 %
Lymphs Abs: 0.9 10*3/uL (ref 0.7–4.0)
MCH: 31.2 pg (ref 26.0–34.0)
MCHC: 31.7 g/dL (ref 30.0–36.0)
MCV: 98.4 fL (ref 80.0–100.0)
Monocytes Absolute: 0.4 10*3/uL (ref 0.1–1.0)
Monocytes Relative: 4 %
Neutro Abs: 9.4 10*3/uL — ABNORMAL HIGH (ref 1.7–7.7)
Neutrophils Relative %: 84 %
Platelets: 211 10*3/uL (ref 150–400)
RBC: 4.43 MIL/uL (ref 3.87–5.11)
RDW: 13.4 % (ref 11.5–15.5)
WBC: 11 10*3/uL — ABNORMAL HIGH (ref 4.0–10.5)
nRBC: 0 % (ref 0.0–0.2)

## 2021-01-12 LAB — TROPONIN I (HIGH SENSITIVITY): Troponin I (High Sensitivity): 7 ng/L (ref <18)

## 2021-01-12 LAB — RESP PANEL BY RT-PCR (FLU A&B, COVID) ARPGX2
Influenza A by PCR: NEGATIVE
Influenza B by PCR: NEGATIVE
SARS Coronavirus 2 by RT PCR: NEGATIVE

## 2021-01-12 LAB — PROTIME-INR
INR: 1 (ref 0.8–1.2)
Prothrombin Time: 13.3 seconds (ref 11.4–15.2)

## 2021-01-12 LAB — APTT: aPTT: 22 seconds — ABNORMAL LOW (ref 24–36)

## 2021-01-12 MED ORDER — LACTATED RINGERS IV SOLN: INTRAVENOUS | Status: AC

## 2021-01-12 MED ORDER — SODIUM CHLORIDE 0.9 % IV BOLUS
1000.0000 mL | Freq: Once | INTRAVENOUS | Status: AC
Start: 2021-01-12 — End: 2021-01-12
  Administered 2021-01-12: 1000 mL via INTRAVENOUS

## 2021-01-12 NOTE — H&P (Signed)
History and Physical    [name] QQV:[id number] DOB: [birth year] DOA: 01/12/2021  PCP: Pleas Koch, NP   Patient coming from: Home  Chief Complaint: near syncope  HPI: [name] is a 75 y.o. female with medical history significant for hypertension, Parkinson disease, history of Bell palsy 1985, dementia who presents after near syncopal episode at home.  She was sitting at the table watching TV she states when she did not feel well and saw bright lights flashing in her field of vision.  States he felt very weak and tired and then had a near syncopal episode.  Her granddaughter who is an EMT took her blood pressure and was unable to get a blood pressure so EMS was called.  CPR was done according to the grandmother.  She has not had any nausea vomiting diarrhea and there is no report of any convulsions or tremors.  Reportedly EMS had a systolic blood pressure 76 and she was given 500 cc of normal saline in route.  In the emergency room her blood pressure has been 120-160/60-70 with a normal heart rate.  She has no neurological deficits and no complaints at this time.  She states that she feels perfectly normal.  During her work-up she was found to have an elevated lactic acid level and was given some IV fluids and repeat lactic acid was a little bit higher so ER called to have patient admitted.  She has no signs of infection or signs of sepsis.  ED Course: She has been hemodynamically stable in the emergency room.  CBC shows a WBC of 11,000 with a hemoglobin of 13.8 hematocrit 43.6 and platelet count of 211,000.  Sodium is 139, potassium 4.8, chloride 102, bicarb 27, glucose 213, creatinine 0.72, BUN 19, LFTs normal.  Lactic acid initially was 3.6.  Repeat after some fluid in the ER was 4.2.  Review of Systems:  General: Denies fever, chills, weight loss, night sweats.  Denies dizziness.  Denies change in appetite HENT: Denies head trauma, headache, denies change in hearing,  tinnitus.  Denies nasal congestion.  Denies sore throat.  Denies difficulty swallowing Eyes: Denies blurry vision, pain in eye, drainage.  Denies discoloration of eyes. Neck: Denies pain.  Denies swelling.  Denies pain with movement. Cardiovascular: Denies chest pain, palpitations.  Denies edema.  Denies orthopnea Respiratory: Denies shortness of breath, cough.  Denies wheezing.  Denies sputum production Gastrointestinal: Denies abdominal pain, swelling.  Denies nausea, vomiting, diarrhea.  Denies melena.  Denies hematemesis. Musculoskeletal: Denies limitation of movement.  Denies deformity or swelling.  Denies pain.  Denies arthralgias or myalgias. Genitourinary: Denies pelvic pain.  Denies urinary frequency or hesitancy.  Denies dysuria.  Skin: Denies rash.  Denies petechiae, purpura, ecchymosis. Neurological: Denies headache. Denies seizure activity. Denies paresthesia. Denies slurred speech, drooping face.  Denies visual change. Psychiatric: Denies depression, anxiety.  Denies hallucinations.  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    "can't do either"   DVT (deep venous thrombosis), left "early 2000's"   RLE   Dysmetabolic syndrome X    Essential hypertension, benign    Headache(784.0) 05/2011   "pretty often since they put me on Parkinson's medicine"   History of shingles    "as a teen  and in my 30's"   Mixed hyperlipidemia    Morbid obesity ([place])    Neurodegenerative gait disorder    Personal history of thrombophlebitis    Syncope and collapse 12/06/11   "this am; I pass out fairly often"   Type II or unspecified type diabetes mellitus without mention of complication, not stated as uncontrolled 12/06/11   "not anymore; had lap band OR"    Past Surgical History:  Procedure Laterality Date    bladder tack  11/1995   BREAST BIOPSY  05/20/2000   bilaterally   BREAST CYST ASPIRATION  12/1998   bilaterally   CARDIAC CATHETERIZATION  2004   CATARACT EXTRACTION W/ INTRAOCULAR LENS  IMPLANT, BILATERAL     FRACTURE SURGERY  02/09/2006   bilateral elbows   FRACTURE SURGERY     right knee   FRACTURE SURGERY     tib plateau   KNEE ARTHROSCOPY  12/12/2002   right    LAPAROSCOPIC GASTRIC BANDING  2008   SEPTOPLASTY  08/1998   with antral window    [place]  1970's    Social History  reports that she has never smoked. She has never used smokeless tobacco. She reports that she does not drink alcohol and does not use drugs.  Allergies  Allergen Reactions   Codeine Sulfate     REACTION: hallucinations   Penicillins Anaphylaxis, Swelling and Rash   Wellbutrin [Bupropion] Other (See Comments)    Seizures and hallucinations    Family History  Problem Relation Age of Onset   Asthma Mother    COPD Father    Heart disease Father    Diabetes Sister    Vision loss Sister        legally blind   Hypertension Child    Hyperlipidemia Child    Diabetes Child    Diabetes Son    Breast cancer Neg Hx      Prior to Admission medications   Medication Sig Start Date End Date Taking? Authorizing Provider  aspirin 81 MG tablet Take 81 mg by mouth daily.    [provider]  Bioflavonoid Products (GRAPE SEED PO) Take 1 tablet by mouth daily.     [provider]  calcium carbonate (TUMS EX) 750 MG chewable tablet Chew 1 tablet by mouth as needed.     [provider]  Calcium Carbonate-Vitamin D (CALCIUM + D PO) Take 600 mg by mouth daily.    [provider]  carbidopa-levodopa (SINEMET IR) 25-100 MG tablet TAKE 3 TABLETS BY MOUTH 3 TIMES A DAY 10/15/20   Tat, Eustace Quail, DO  cetirizine (ZYRTEC) 10 MG tablet Take 10 mg by mouth daily.    [provider]  Cholecalciferol (VITAMIN D3) 1000 UNITS CAPS  Take 1 capsule by mouth daily.    [provider]  clonazePAM (KLONOPIN) 0.5 MG tablet TAKE 1/2 TABLET BY MOUTH AT BEDTIME AND 1/2 DAILY AS NEEDED 09/17/20   Tat, Eustace Quail, DO  escitalopram (LEXAPRO) 10 MG tablet TAKE 1 TABLET BY MOUTH EVERY DAY 07/15/20   Tat, Eustace Quail, DO  feeding supplement (ENSURE COMPLETE) LIQD Take 237 mLs by mouth 2 (two) times daily between meals. Patient taking differently: Take 237 mLs by mouth once. 04/01/12   Robbie Lis, MD  fluticasone [facility]) 50 MCG/ACT nasal spray Place 2 sprays into both nostrils daily. 10/02/20   [provider]  gabapentin (NEURONTIN) 100 MG capsule TAKE 1 CAPSULE BY  MOUTH THREE TIMES A DAY 10/15/20   Tat, Eustace Quail, DO  losartan-hydrochlorothiazide (HYZAAR) 50-12.5 MG tablet TAKE 1 TABLET BY MOUTH EVERY DAY FOR BLOOD PRESSURE 06/20/20   Pleas Koch, NP  mirtazapine (REMERON) 30 MG tablet TAKE 1 TABLET (30 MG TOTAL) BY MOUTH AT BEDTIME. FOR SLEEP. 07/10/20   Pleas Koch, NP  montelukast (SINGULAIR) 10 MG tablet Take 1 tablet (10 mg total) by mouth at bedtime. For allergies 11/05/20   Pleas Koch, NP  Multiple Vitamins-Minerals (CENTRUM SILVER ADULT 50+) TABS Take by mouth.    [provider]  naproxen sodium (ALEVE) 220 MG tablet Take 220 mg by mouth in the morning and at bedtime.    [provider]  rosuvastatin (CRESTOR) 5 MG tablet TAKE 1 TABLET (5 MG TOTAL) BY MOUTH EVERY EVENING. FOR CHOLESTEROL. 12/31/20   Pleas Koch, NP  vitamin B-12 (CYANOCOBALAMIN) 1000 MCG tablet Take 1,000 mcg by mouth daily.    [provider]  simvastatin (ZOCOR) 20 MG tablet TAKE 1 TABLET BY MOUTH EVERY DAY AT BEDTIME FOR CHOLESTEROL 10/01/19 01/02/20  Pleas Koch, NP    Physical Exam: Vitals:   01/12/21 2019 01/12/21 2030 01/12/21 2100 01/12/21 2109  BP:  (!) 146/69  140/70  Pulse: 64 65 72 66  Resp: 13 16 15 17   Temp:      TempSrc:      SpO2: 99% 98% 100% 98%  Weight:      Height:         Constitutional: NAD, calm, comfortable Vitals:   01/12/21 2019 01/12/21 2030 01/12/21 2100 01/12/21 2109  BP:  (!) 146/69  140/70  Pulse: 64 65 72 66  Resp: 13 16 15 17   Temp:      TempSrc:      SpO2: 99% 98% 100% 98%  Weight:      Height:       General: WDWN, Alert and oriented x3.  Eyes: EOMI, PERRL, conjunctivae normal.  Sclera nonicteric HENT:  [place]/AT, external ears normal.  Nares patent without epistasis.  Mucous membranes are moist.  Neck: Soft, normal range of motion, supple, no masses, no thyromegaly.  Trachea midline Respiratory: clear to auscultation bilaterally, no wheezing, no crackles. Normal respiratory effort. No accessory muscle use.  Cardiovascular: Regular rate and rhythm, no murmurs / rubs / gallops. No extremity edema. 2+ pedal pulses. No carotid bruits.  Abdomen: Soft, no tenderness, nondistended, no rebound or guarding. No masses palpated. Bowel sounds normoactive Musculoskeletal: FROM. no cyanosis. No joint deformity upper and lower extremities. Normal muscle tone.  Skin: Warm, dry, intact no rashes, lesions, ulcers. No induration Neurologic: CN 2-12 grossly intact.  Normal speech.  Sensation intact. Mild droop on left side of face that is chronic. Strength 5/5 in all extremities.   Psychiatric: Normal judgment and insight.  Normal mood.    Labs on Admission: I have personally reviewed following labs and imaging studies  CBC: Recent Labs  Lab 01/12/21 1340  WBC 11.0*  NEUTROABS 9.4*  HGB 13.8  HCT 43.6  MCV 98.4  PLT 937    Basic Metabolic Panel: Recent Labs  Lab 01/12/21 1340  NA 139  K 4.8  CL 102  CO2 27  GLUCOSE 213*  BUN 19  CREATININE 0.72  CALCIUM 9.3    GFR: Estimated Creatinine Clearance: 54.9 mL/min (by C-G formula based on SCr of 0.72 mg/dL).  Liver Function Tests: Recent Labs  Lab 01/12/21 1340  AST 18  ALT 5  ALKPHOS 60  BILITOT 0.3  PROT 7.1  ALBUMIN 3.9    Urine analysis:    Component Value Date/Time    COLORURINE YELLOW 01/12/2021 1442   APPEARANCEUR HAZY (A) 01/12/2021 1442   LABSPEC 1.018 01/12/2021 1442   PHURINE 6.0 01/12/2021 1442   GLUCOSEU NEGATIVE 01/12/2021 1442   HGBUR NEGATIVE 01/12/2021 1442   BILIRUBINUR NEGATIVE 01/12/2021 1442   BILIRUBINUR negative 12/03/2020 0839   KETONESUR 5 (A) 01/12/2021 1442   PROTEINUR NEGATIVE 01/12/2021 1442   UROBILINOGEN 0.2 12/03/2020 0839   UROBILINOGEN 0.2 02/06/2017 1236   NITRITE NEGATIVE 01/12/2021 1442   LEUKOCYTESUR NEGATIVE 01/12/2021 1442    Radiological Exams on Admission: DG Chest Port 1 View  Result Date: 01/12/2021 CLINICAL DATA:  Possible sepsis. EXAM: PORTABLE CHEST 1 VIEW COMPARISON:  02/06/2017 FINDINGS: 1411 hours. The lungs are clear without focal pneumonia, edema, pneumothorax or pleural effusion. The cardiopericardial silhouette is within normal limits for size. The visualized bony structures of the thorax show no acute abnormality. Telemetry leads overlie the chest. IMPRESSION: No active disease. Electronically Signed   By: Misty Stanley M.D.   On: 01/12/2021 14:45    EKG: Independently reviewed.  EKG shows normal sinus rhythm with no acute ST elevation or depression.  QTc 435  Assessment/Plan Principal Problem:   Near syncope Ms Fair is placed on telemetry for further evaluation and monitoring. She did not have LOC at time of near syncope.  No neurological deficits.  Patient states she is at her baseline level of functioning and mentation.  She reports that she saw floaters and flashing lights in her field of vision before the near syncopal event and felt very weak but does not have any of that now. We will check serial troponin levels to make sure she does not have a cardiac event as the etiology.  Troponin was not done in the emergency room  Active Problems:   HYPERTENSION, BENIGN ESSENTIAL Sinew Hyzaar.  Monitor blood pressure.  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 Recheck level now. Pt has no signs of infection. This is helping me investigate if Sinemet may cause an elevated lactic acid level.   DVT prophylaxis: Lovenox for DVT prophylaxis Code Status:   Full Code  Family Communication:  Diagnosis and plan discussed with patient who verbalizes understanding and agrees with plan.  Further recommendations to follow as clinically indicated Disposition Plan:   Patient is from:  Home  Anticipated DC to:  Home  Anticipated DC date:  Anticipate less than 2 midnight stay  Anticipated DC barriers: No barriers to discharge identified at this time  Admission status:  Observation   Eben Burow MD [facility]  How to contact the [facility] Attending or Consulting provider [place] or covering provider during after hours [place], for this patient?   Check the care team in [facility] and look for a) attending/consulting [facility] provider listed and b) the [facility] team listed Log into [email or web address] and use [place]'s universal password to access. If you do not have the password, please contact the hospital operator. Locate the [facility] provider you are looking for under [facility] and page to a number that you can be directly reached. If you still have difficulty reaching the provider, please page the [facility] (Director  on Call) for the Hospitalists listed on amion for assistance.  01/12/2021, 9:42 PM

## 2021-01-12 NOTE — ED Notes (Signed)
Crackers and diet ginger ale provided.

## 2021-01-12 NOTE — ED Triage Notes (Addendum)
Patient BIB [facility] from home.  EMS was called out a diabetic problem but when they got there that was not the case.  When EMS arrived she had generalized weakness, dizziness, and bradycardia @ 52.  EMS placed 18 g left hand, patient received 500 ml NS.  Patient lives with her and recently had covid. Hx of bells palsy, parkinson, and dementia.   Vital were 109/54 62-R 96% room air 203-CBG

## 2021-01-12 NOTE — ED Notes (Signed)
Patient is resting comfortably. 

## 2021-01-12 NOTE — ED Notes (Signed)
ED Provider at bedside. 

## 2021-01-12 NOTE — ED Provider Notes (Signed)
[facility] DEPT Provider Note   CSN: [id number] Arrival date & time: 01/12/21  1257     History Chief Complaint  Patient presents with   Dizziness   Bradycardia   Weakness    [name] is a 75 y.o. female.  Pt presents to the ED today with generalized weakness.  Pt said she woke up this morning around 0600 and was fine.  She was sitting at the computer and started seeing floaters in both eyes.  She felt very tired and very weak.  Pt's granddaughter is an EMT and could not get a good blood pressure, so EMS was called.  Pt denies any n/v/d.  She does have a family member who has recently had covid.  She's been fully vaccinated.  Pt's initial sbp for EMS was 76.  She was given 500 cc NS and pt feels much better.  She has no complaints now.      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    "can't do either"   DVT (deep venous thrombosis), left "early 2000's"   RLE   Dysmetabolic syndrome X    Essential hypertension, benign    Headache(784.0) 05/2011   "pretty often since they put me on Parkinson's medicine"   History of shingles    "as a teen and in my 53's"   Mixed hyperlipidemia    Morbid obesity ([place])    Neurodegenerative gait disorder    Personal history of thrombophlebitis    Syncope and collapse 12/06/11   "this am; I pass out fairly often"   Type II or unspecified type diabetes mellitus without mention of complication, not stated as uncontrolled 12/06/11   "not anymore; had lap band OR"    Patient Active Problem List   Diagnosis Date Noted   Cough 04/18/2020   Vascular parkinsonism ([place]) 12/14/2019   Preventative health care 12/12/2017   Cramping of hands 03/23/2017   Osteoporosis 01/31/2017   GERD (gastroesophageal reflux disease) 08/26/2015    Recurrent UTI 05/27/2015   Diabetes mellitus type 2, diet-controlled ([place]) 12/03/2014   DNR (do not resuscitate) 11/23/2012   Dysphagia 12/07/2011   Orthostasis 12/07/2011   Bell's palsy    Hypersomnia 01/18/2011   ANXIETY DEPRESSION 04/16/2010   OBESITY, MORBID 02/16/2007   HYPERLIPIDEMIA, MIXED 01/31/2007   HYPERTENSION, BENIGN ESSENTIAL 01/31/2007    Past Surgical History:  Procedure Laterality Date   bladder tack  11/1995   BREAST BIOPSY  05/20/2000   bilaterally   BREAST CYST ASPIRATION  12/1998   bilaterally   CARDIAC CATHETERIZATION  2004   CATARACT EXTRACTION W/ INTRAOCULAR LENS  IMPLANT, BILATERAL     FRACTURE SURGERY  02/09/2006   bilateral elbows   FRACTURE SURGERY     right knee   FRACTURE SURGERY     tib plateau   KNEE ARTHROSCOPY  12/12/2002   right    LAPAROSCOPIC GASTRIC BANDING  2008   SEPTOPLASTY  08/1998   with antral window    TONSILLECTOMY AND ADENOIDECTOMY  1970   TUBAL LIGATION  1970's     OB History   No obstetric history on file.     Family History  Problem Relation Age of Onset   Asthma Mother    COPD Father  Heart disease Father    Diabetes Sister    Vision loss Sister        legally blind   Hypertension Child    Hyperlipidemia Child    Diabetes Child    Diabetes Son    Breast cancer Neg Hx     Social History   Tobacco Use   Smoking status: Never   Smokeless tobacco: Never  Vaping Use   Vaping Use: Never used  Substance Use Topics   Alcohol use: No    Comment: none   Drug use: No    Home Medications Prior to Admission medications   Medication Sig Start Date End Date Taking? Authorizing Provider  aspirin 81 MG tablet Take 81 mg by mouth daily.    [provider]  Bioflavonoid Products (GRAPE SEED PO) Take 1 tablet by mouth daily.     [provider]  calcium carbonate (TUMS EX) 750 MG chewable tablet Chew 1 tablet by mouth as needed.     [provider]  Calcium Carbonate-Vitamin D  (CALCIUM + D PO) Take 600 mg by mouth daily.    [provider]  carbidopa-levodopa (SINEMET IR) 25-100 MG tablet TAKE 3 TABLETS BY MOUTH 3 TIMES A DAY 10/15/20   Tat, Eustace Quail, DO  cetirizine (ZYRTEC) 10 MG tablet Take 10 mg by mouth daily.    [provider]  Cholecalciferol (VITAMIN D3) 1000 UNITS CAPS Take 1 capsule by mouth daily.    [provider]  clonazePAM (KLONOPIN) 0.5 MG tablet TAKE 1/2 TABLET BY MOUTH AT BEDTIME AND 1/2 DAILY AS NEEDED 09/17/20   Tat, Eustace Quail, DO  escitalopram (LEXAPRO) 10 MG tablet TAKE 1 TABLET BY MOUTH EVERY DAY 07/15/20   Tat, Eustace Quail, DO  feeding supplement (ENSURE COMPLETE) LIQD Take 237 mLs by mouth 2 (two) times daily between meals. Patient taking differently: Take 237 mLs by mouth once. 04/01/12   Robbie Lis, MD  fluticasone [facility]) 50 MCG/ACT nasal spray Place 2 sprays into both nostrils daily. 10/02/20   [provider]  gabapentin (NEURONTIN) 100 MG capsule TAKE 1 CAPSULE BY MOUTH THREE TIMES A DAY 10/15/20   Tat, Eustace Quail, DO  losartan-hydrochlorothiazide (HYZAAR) 50-12.5 MG tablet TAKE 1 TABLET BY MOUTH EVERY DAY FOR BLOOD PRESSURE 06/20/20   Pleas Koch, NP  mirtazapine (REMERON) 30 MG tablet TAKE 1 TABLET (30 MG TOTAL) BY MOUTH AT BEDTIME. FOR SLEEP. 07/10/20   Pleas Koch, NP  montelukast (SINGULAIR) 10 MG tablet Take 1 tablet (10 mg total) by mouth at bedtime. For allergies 11/05/20   Pleas Koch, NP  Multiple Vitamins-Minerals (CENTRUM SILVER ADULT 50+) TABS Take by mouth.    [provider]  naproxen sodium (ALEVE) 220 MG tablet Take 220 mg by mouth in the morning and at bedtime.    [provider]  rosuvastatin (CRESTOR) 5 MG tablet TAKE 1 TABLET (5 MG TOTAL) BY MOUTH EVERY EVENING. FOR CHOLESTEROL. 12/31/20   Pleas Koch, NP  vitamin B-12 (CYANOCOBALAMIN) 1000 MCG tablet Take 1,000 mcg by mouth daily.    [provider]  simvastatin (ZOCOR) 20 MG tablet  TAKE 1 TABLET BY MOUTH EVERY DAY AT BEDTIME FOR CHOLESTEROL 10/01/19 01/02/20  Pleas Koch, NP    Allergies    Codeine sulfate, Penicillins, and Wellbutrin [bupropion]  Review of Systems   Review of Systems  Neurological:  Positive for dizziness and weakness.  All other systems reviewed and are negative.  Physical  Exam Updated Vital Signs BP 135/63   Pulse 72   Temp (!) 97.5 F (36.4 C) (Oral)   Resp 19   Ht 5\' 1"  (1.549 m)   Wt 73.5 kg   SpO2 93%   BMI 30.61 kg/m   Physical Exam Vitals and nursing note reviewed.  Constitutional:      Appearance: Normal appearance.  HENT:     Head: Normocephalic and atraumatic.     Nose: Nose normal.     Mouth/Throat:     Mouth: Mucous membranes are moist.     Pharynx: Oropharynx is clear.  Eyes:     Extraocular Movements: Extraocular movements intact.     Conjunctiva/sclera: Conjunctivae normal.     Pupils: Pupils are equal, round, and reactive to light.  Cardiovascular:     Rate and Rhythm: Normal rate and regular rhythm.     Pulses: Normal pulses.     Heart sounds: Normal heart sounds.  Pulmonary:     Effort: Pulmonary effort is normal.     Breath sounds: Normal breath sounds.  Abdominal:     General: Abdomen is flat. Bowel sounds are normal.     Palpations: Abdomen is soft.  Musculoskeletal:        General: Normal range of motion.     Cervical back: Normal range of motion and neck supple.  Skin:    General: Skin is warm.     Capillary Refill: Capillary refill takes less than 2 seconds.  Neurological:     Mental Status: She is alert and oriented to person, place, and time.     Comments: Left facial droop (chronic due to hx of bells palsy)  Psychiatric:        Mood and Affect: Mood normal.        Behavior: Behavior normal.    ED Results / Procedures / Treatments   Labs (all labs ordered are listed, but only abnormal results are displayed) Labs Reviewed  LACTIC ACID, PLASMA - Abnormal; Notable for the following  components:      Result Value   Lactic Acid, Venous 3.6 (*)    All other components within normal limits  COMPREHENSIVE METABOLIC PANEL - Abnormal; Notable for the following components:   Glucose, Bld 213 (*)    All other components within normal limits  CBC WITH DIFFERENTIAL/PLATELET - Abnormal; Notable for the following components:   WBC 11.0 (*)    Neutro Abs 9.4 (*)    All other components within normal limits  APTT - Abnormal; Notable for the following components:   aPTT 22 (*)    All other components within normal limits  URINALYSIS, ROUTINE W REFLEX MICROSCOPIC - Abnormal; Notable for the following components:   APPearance HAZY (*)    Ketones, ur 5 (*)    All other components within normal limits  RESP PANEL BY RT-PCR (FLU A&B, COVID) ARPGX2  URINE CULTURE  CULTURE, BLOOD (ROUTINE X 2)  CULTURE, BLOOD (ROUTINE X 2)  PROTIME-INR  LACTIC ACID, PLASMA    EKG None  Radiology DG Chest Port 1 View  Result Date: 01/12/2021 CLINICAL DATA:  Possible sepsis. EXAM: PORTABLE CHEST 1 VIEW COMPARISON:  02/06/2017 FINDINGS: 1411 hours. The lungs are clear without focal pneumonia, edema, pneumothorax or pleural effusion. The cardiopericardial silhouette is within normal limits for size. The visualized bony structures of the thorax show no acute abnormality. Telemetry leads overlie the chest. IMPRESSION: No active disease. Electronically Signed   By: Verda Cumins.D.  On: 01/12/2021 14:45    Procedures Procedures   Medications Ordered in ED Medications  lactated ringers infusion ( Intravenous New Bag/Given 01/12/21 1416)  sodium chloride 0.9 % bolus 1,000 mL (1,000 mLs Intravenous New Bag/Given 01/12/21 1523)    ED Course  I have reviewed the triage vital signs and the nursing notes.  Pertinent labs & imaging results that were available during my care of the patient were reviewed by me and considered in my medical decision making (see chart for details).    MDM  Rules/Calculators/A&P                          Pt is feeling much better after fluids.  Lactic is elevated, but I suspect that will improve after fluids.    Final Clinical Impression(s) / ED Diagnoses Final diagnoses:  Dehydration    Rx / DC Orders ED Discharge Orders     None        Isla Pence, MD 01/12/21 1601

## 2021-01-12 NOTE — ED Notes (Signed)
Vital signs stable. 

## 2021-01-13 ENCOUNTER — Other Ambulatory Visit: Payer: Self-pay | Admitting: Primary Care

## 2021-01-13 DIAGNOSIS — R55 Syncope and collapse: Secondary | ICD-10-CM | POA: Diagnosis not present

## 2021-01-13 DIAGNOSIS — F341 Dysthymic disorder: Secondary | ICD-10-CM

## 2021-01-13 LAB — BASIC METABOLIC PANEL
Anion gap: 6 (ref 5–15)
BUN: 13 mg/dL (ref 8–23)
CO2: 29 mmol/L (ref 22–32)
Calcium: 8.7 mg/dL — ABNORMAL LOW (ref 8.9–10.3)
Chloride: 107 mmol/L (ref 98–111)
Creatinine, Ser: 0.58 mg/dL (ref 0.44–1.00)
GFR, Estimated: >60 mL/min (ref >60)
Glucose, Bld: 105 mg/dL — ABNORMAL HIGH (ref 70–99)
Potassium: 3.7 mmol/L (ref 3.5–5.1)
Sodium: 142 mmol/L (ref 135–145)

## 2021-01-13 LAB — TROPONIN I (HIGH SENSITIVITY)
Troponin I (High Sensitivity): 8 ng/L (ref <18)
Troponin I (High Sensitivity): 9 ng/L (ref <18)

## 2021-01-13 LAB — CBC
HCT: 35.7 % — ABNORMAL LOW (ref 36.0–46.0)
Hemoglobin: 11.2 g/dL — ABNORMAL LOW (ref 12.0–15.0)
MCH: 30.8 pg (ref 26.0–34.0)
MCHC: 31.4 g/dL (ref 30.0–36.0)
MCV: 98.1 fL (ref 80.0–100.0)
Platelets: 185 10*3/uL (ref 150–400)
RBC: 3.64 MIL/uL — ABNORMAL LOW (ref 3.87–5.11)
RDW: 13.2 % (ref 11.5–15.5)
WBC: 8.3 10*3/uL (ref 4.0–10.5)
nRBC: 0 % (ref 0.0–0.2)

## 2021-01-13 LAB — LACTIC ACID, PLASMA: Lactic Acid, Venous: 1 mmol/L (ref 0.5–1.9)

## 2021-01-13 MED ORDER — LOSARTAN POTASSIUM-HCTZ 50-12.5 MG PO TABS: 1.0000 | ORAL_TABLET | Freq: Every day | ORAL | Status: DC

## 2021-01-13 MED ORDER — ACETAMINOPHEN 325 MG PO TABS
650.0000 mg | ORAL_TABLET | Freq: Four times a day (QID) | ORAL | Status: DC | PRN
  Administered 2021-01-13: 650 mg via ORAL
  Filled 2021-01-13: qty 2

## 2021-01-13 MED ORDER — LOSARTAN POTASSIUM 50 MG PO TABS
50.0000 mg | ORAL_TABLET | Freq: Every day | ORAL | Status: DC
  Filled 2021-01-13: qty 1

## 2021-01-13 MED ORDER — CLONAZEPAM 0.5 MG PO TABS: 0.2500 mg | ORAL_TABLET | Freq: Two times a day (BID) | ORAL | Status: DC | PRN

## 2021-01-13 MED ORDER — MONTELUKAST SODIUM 10 MG PO TABS: 10.0000 mg | ORAL_TABLET | Freq: Every day | ORAL | Status: DC

## 2021-01-13 MED ORDER — ESCITALOPRAM OXALATE 10 MG PO TABS
10.0000 mg | ORAL_TABLET | Freq: Every day | ORAL | Status: DC
  Filled 2021-01-13: qty 1

## 2021-01-13 MED ORDER — CARBIDOPA-LEVODOPA 25-100 MG PO TABS
3.0000 | ORAL_TABLET | Freq: Three times a day (TID) | ORAL | Status: DC
  Filled 2021-01-13: qty 3

## 2021-01-13 MED ORDER — ENOXAPARIN SODIUM 40 MG/0.4ML IJ SOSY: 40.0000 mg | PREFILLED_SYRINGE | INTRAMUSCULAR | Status: DC

## 2021-01-13 MED ORDER — FLUTICASONE PROPIONATE 50 MCG/ACT NA SUSP
2.0000 | Freq: Every day | NASAL | Status: DC
  Filled 2021-01-13: qty 16

## 2021-01-13 MED ORDER — HYDROCHLOROTHIAZIDE 12.5 MG PO CAPS
12.5000 mg | ORAL_CAPSULE | Freq: Every day | ORAL | Status: DC
  Filled 2021-01-13: qty 1

## 2021-01-13 MED ORDER — ROSUVASTATIN CALCIUM 5 MG PO TABS: 5.0000 mg | ORAL_TABLET | Freq: Every evening | ORAL | Status: DC

## 2021-01-13 MED ORDER — ASPIRIN 81 MG PO CHEW
81.0000 mg | CHEWABLE_TABLET | Freq: Every day | ORAL | Status: DC
  Filled 2021-01-13: qty 1

## 2021-01-13 MED ORDER — MIRTAZAPINE 15 MG PO TABS
30.0000 mg | ORAL_TABLET | Freq: Every day | ORAL | Status: DC
  Administered 2021-01-13: 30 mg via ORAL
  Filled 2021-01-13: qty 1

## 2021-01-13 MED ORDER — ACETAMINOPHEN 650 MG RE SUPP: 650.0000 mg | Freq: Four times a day (QID) | RECTAL | Status: DC | PRN

## 2021-01-13 MED ORDER — LACTATED RINGERS IV SOLN: INTRAVENOUS | Status: DC

## 2021-01-13 MED ORDER — MIRTAZAPINE 15 MG PO TABS: 30.0000 mg | ORAL_TABLET | Freq: Every day | ORAL | Status: DC

## 2021-01-13 NOTE — ED Notes (Signed)
Pt voided in bsc.

## 2021-01-13 NOTE — Evaluation (Signed)
Occupational Therapy Evaluation Patient Details Name: [name] MRN: [id number] DOB: 1945/09/14 Today's Date: 01/13/2021    History of Present Illness Patient is a 75 year old female presents to ED presents after near syncopal episode at home. Granddaughter who works for EMS was unable to get manual BP at home post episode therefore brought to ED. PMH includes hypertension, Parkinson disease, history of Bell palsy 1985, dementia   Clinical Impression   Patient lives in single level house 2 steps to enter, daughter and granddaughter live with her. DTR works from home since the pandemic. Patient independent with all self care tasks, no AD use. Patient demonstrate functional transfers, ambulation and ADL tasks during eval without any assistance, no acute OT needs. Signing off, thank you for referral.     Follow Up Recommendations  No OT follow up    Equipment Recommendations  None recommended by OT       Precautions / Restrictions Precautions Precautions: None Restrictions Weight Bearing Restrictions: No      Mobility Bed Mobility Overal bed mobility: Modified Independent                  Transfers Overall transfer level: Independent                    Balance Overall balance assessment: Independent                                         ADL either performed or assessed with clinical judgement   ADL Overall ADL's : Independent                                       General ADL Comments: patient able to ambulate to bathroom, transfer on/off toilet, perform peri care and clothing management as well as hand hygiene all without any physical assistance      Pertinent Vitals/Pain Pain Assessment: No/denies pain     Hand Dominance Right   Extremity/Trunk Assessment Upper Extremity Assessment Upper Extremity Assessment: Overall WFL for tasks assessed   Lower Extremity Assessment Lower Extremity Assessment: Overall  WFL for tasks assessed;Defer to PT evaluation   Cervical / Trunk Assessment Cervical / Trunk Assessment: Normal   Communication Communication Communication: No difficulties   Cognition Arousal/Alertness: Awake/alert Behavior During Therapy: WFL for tasks assessed/performed Overall Cognitive Status: Within Functional Limits for tasks assessed                                                Home Living Family/patient expects to be discharged to:: Private residence Living Arrangements: Children;Other (Comment) (daughter and granddaughtet) Available Help at Discharge: Family;Available 24 hours/day (DTR works from home) Type of Home: House Home Access: Stairs to enter [employer] of Steps: 2   [place]: One level     Bathroom Shower/Tub: Teacher, early years/pre: Handicapped height     Home Equipment: Environmental consultant - 2 wheels;Cane - single point;Shower seat;Wheelchair - manual          Prior Functioning/Environment Level of Independence: Independent                 OT Problem List: Decreased  activity tolerance         OT Goals(Current goals can be found in the care plan section) Acute Rehab OT Goals Patient Stated Goal: home OT Goal Formulation: All assessment and education complete, DC therapy   AM-PAC OT "6 Clicks" Daily Activity     Outcome Measure Help from another person eating meals?: None Help from another person taking care of personal grooming?: None Help from another person toileting, which includes using toliet, bedpan, or urinal?: None Help from another person bathing (including washing, rinsing, drying)?: None Help from another person to put on and taking off regular upper body clothing?: None Help from another person to put on and taking off regular lower body clothing?: None 6 Click Score: 24   End of Session Nurse Communication: Mobility status  Activity Tolerance: Patient tolerated treatment well Patient  left: in bed;with call bell/phone within reach  OT Visit Diagnosis: Other abnormalities of gait and mobility (R26.89)                Time: 6578-4696 OT Time Calculation (min): 10 min Charges:  OT General Charges $OT Visit: 1 Visit OT Evaluation $OT Eval Low Complexity: 1 Low  Delbert Phenix OT OT pager: [phone] 01/13/2021, 10:03 AM

## 2021-01-13 NOTE — Plan of Care (Signed)
  Problem: Education: Goal: Knowledge of General Education information will improve Description: Including pain rating scale, medication(s)/side effects and non-pharmacologic comfort measures 01/13/2021 1047 by Barrington Ellison, RN Outcome: Adequate for Discharge 01/13/2021 1047 by Barrington Ellison, RN Outcome: Adequate for Discharge   Problem: Health Behavior/Discharge Planning: Goal: Ability to manage health-related needs will improve 01/13/2021 1047 by Barrington Ellison, RN Outcome: Adequate for Discharge 01/13/2021 1047 by Barrington Ellison, RN Outcome: Adequate for Discharge   Problem: Clinical Measurements: Goal: Ability to maintain clinical measurements within normal limits will improve 01/13/2021 1047 by Barrington Ellison, RN Outcome: Adequate for Discharge 01/13/2021 1047 by Barrington Ellison, RN Outcome: Adequate for Discharge Goal: Will remain free from infection 01/13/2021 1047 by Barrington Ellison, RN Outcome: Adequate for Discharge 01/13/2021 1047 by Barrington Ellison, RN Outcome: Adequate for Discharge Goal: Diagnostic test results will improve 01/13/2021 1047 by Barrington Ellison, RN Outcome: Adequate for Discharge 01/13/2021 1047 by Barrington Ellison, RN Outcome: Adequate for Discharge Goal: Respiratory complications will improve 01/13/2021 1047 by Barrington Ellison, RN Outcome: Adequate for Discharge 01/13/2021 1047 by Barrington Ellison, RN Outcome: Adequate for Discharge Goal: Cardiovascular complication will be avoided 01/13/2021 1047 by Barrington Ellison, RN Outcome: Adequate for Discharge 01/13/2021 1047 by Barrington Ellison, RN Outcome: Adequate for Discharge   Problem: Activity: Goal: Risk for activity intolerance will decrease 01/13/2021 1047 by Barrington Ellison, RN Outcome: Adequate for Discharge 01/13/2021 1047 by Barrington Ellison, RN Outcome: Adequate for Discharge   Problem: Nutrition: Goal: Adequate nutrition will be maintained 01/13/2021 1047 by Barrington Ellison,  RN Outcome: Adequate for Discharge 01/13/2021 1047 by Barrington Ellison, RN Outcome: Adequate for Discharge   Problem: Coping: Goal: Level of anxiety will decrease 01/13/2021 1047 by Barrington Ellison, RN Outcome: Adequate for Discharge 01/13/2021 1047 by Barrington Ellison, RN Outcome: Adequate for Discharge   Problem: Elimination: Goal: Will not experience complications related to bowel motility 01/13/2021 1047 by Barrington Ellison, RN Outcome: Adequate for Discharge 01/13/2021 1047 by Barrington Ellison, RN Outcome: Adequate for Discharge Goal: Will not experience complications related to urinary retention 01/13/2021 1047 by Barrington Ellison, RN Outcome: Adequate for Discharge 01/13/2021 1047 by Barrington Ellison, RN Outcome: Adequate for Discharge   Problem: Pain Managment: Goal: General experience of comfort will improve 01/13/2021 1047 by Barrington Ellison, RN Outcome: Adequate for Discharge 01/13/2021 1047 by Barrington Ellison, RN Outcome: Adequate for Discharge   Problem: Safety: Goal: Ability to remain free from injury will improve 01/13/2021 1047 by Barrington Ellison, RN Outcome: Adequate for Discharge 01/13/2021 1047 by Barrington Ellison, RN Outcome: Adequate for Discharge   Problem: Skin Integrity: Goal: Risk for impaired skin integrity will decrease 01/13/2021 1047 by Barrington Ellison, RN Outcome: Adequate for Discharge 01/13/2021 1047 by Barrington Ellison, RN Outcome: Adequate for Discharge

## 2021-01-13 NOTE — Progress Notes (Signed)
PT Cancellation Note  Patient Details Name: [name] MRN: [id number] DOB: 12-Nov-1945   Cancelled Treatment:    Reason Eval/Treat Not Completed: PT screened, no needs identified, will sign off  Family in room and reports pt back to baseline, was only dehydrated at home leading to admission.  Pt and family member report no DME or therapy needs upon d/c.  Pt awaiting d/c home today.     Izik Bingman,KATHrine E 01/13/2021, 10:47 AM Jannette Spanner PT, DPT Acute Rehabilitation Services Pager: [phone] Office: [phone]

## 2021-01-13 NOTE — Discharge Summary (Signed)
Physician Discharge Summary  [name] ZDG:[id number] DOB: [birth year] DOA: 01/12/2021  PCP: Pleas Koch, NP  Admit date: 01/12/2021 Discharge date: 01/13/2021    Admitted From: Home Disposition: Home  Recommendations for Outpatient Follow-up:  Follow up with PCP in 1-2 weeks Please obtain BMP/CBC in one week Please follow up with your PCP on the following pending results: Unresulted Labs (From admission, onward)     Start     Ordered   01/19/21 0500  Creatinine, serum  (enoxaparin (LOVENOX)    CrCl >/= 30 ml/min)  Weekly,   R     Comments: while on enoxaparin therapy    01/13/21 0055   01/13/21 0813  Lactic acid, plasma  STAT Now then every 3 hours,   R (with STAT occurrences)      01/13/21 0812   01/12/21 1307  Urine culture  (Undifferentiated presentation (screening labs and basic nursing orders))  ONCE - STAT,   STAT        01/12/21 [place]: None Equipment/Devices: None  Discharge Condition: Stable CODE STATUS: Full code Diet recommendation: Cardiac  Subjective: Seen and examined.  Feels much better.  Back to baseline.  No complaints.  Wants to go home.  Following HPI and ED course is copied from my colleague hospitalist Dr. Leonidas Romberg H&P.  HPI: [name] is a 75 y.o. female with medical history significant for hypertension, Parkinson disease, history of Bell palsy 1985, dementia who presents after near syncopal episode at home.  She was sitting at the table watching TV she states when she did not feel well and saw bright lights flashing in her field of vision.  States he felt very weak and tired and then had a near syncopal episode.  Her granddaughter who is an EMT took her blood pressure and was unable to get a blood pressure so EMS was called.  CPR was done according to the grandmother.  She has not had any nausea vomiting diarrhea and there is no report of any convulsions or tremors.  Reportedly EMS had a systolic blood  pressure 76 and she was given 500 cc of normal saline in route.  In the emergency room her blood pressure has been 120-160/60-70 with a normal heart rate.  She has no neurological deficits and no complaints at this time.  She states that she feels perfectly normal.  During her work-up she was found to have an elevated lactic acid level and was given some IV fluids and repeat lactic acid was a little bit higher so ER called to have patient admitted.  She has no signs of infection or signs of sepsis.   ED Course: She has been hemodynamically stable in the emergency room.  CBC shows a WBC of 11,000 with a hemoglobin of 13.8 hematocrit 43.6 and platelet count of 211,000.  Sodium is 139, potassium 4.8, chloride 102, bicarb 27, glucose 213, creatinine 0.72, BUN 19, LFTs normal.  Lactic acid initially was 3.6.  Repeat after some fluid in the ER was 4.2.  Brief/Interim Summary: Patient was admitted with near syncope.  No neurological deficits upon presentation or during the short hospitalization.  She denies seeing any more floaters or flashing lights in her field of vision this morning.  Her cardiac enzymes x3 are completely within normal range.  Patient had some lactic acidosis which was likely secondary to dehydration which has resolved after she received IV hydration.  Rest of the labs are within normal range.  All of her symptoms are likely secondary to dehydration.  Patient is back to baseline and this is verified by the family so she is going to be discharged home.  She was also evaluated by PT OT.  Patient fully alert and oriented this morning.  Discharge Diagnoses:  Principal Problem:   Near syncope Active Problems:   HYPERTENSION, BENIGN ESSENTIAL   Vascular parkinsonism (HCC)   Elevated lactic acid level    Discharge Instructions   Allergies as of 01/13/2021       Reactions   Codeine Sulfate    REACTION: hallucinations   Penicillins Anaphylaxis, Swelling, Rash   Wellbutrin [bupropion]  Other (See Comments)   Seizures and hallucinations        Medication List     TAKE these medications    aspirin 81 MG tablet Take 81 mg by mouth daily.   CALCIUM + D PO Take 600 mg by mouth daily.   calcium carbonate 750 MG chewable tablet Commonly known as: TUMS EX Chew 1 tablet by mouth as needed.   carbidopa-levodopa 25-100 MG tablet Commonly known as: SINEMET IR TAKE 3 TABLETS BY MOUTH 3 TIMES A DAY   Centrum Silver Adult 50+ Tabs Take by mouth.   cetirizine 10 MG tablet Commonly known as: ZYRTEC Take 10 mg by mouth daily.   clonazePAM 0.5 MG tablet Commonly known as: KLONOPIN TAKE 1/2 TABLET BY MOUTH AT BEDTIME AND 1/2 DAILY AS NEEDED   escitalopram 10 MG tablet Commonly known as: LEXAPRO TAKE 1 TABLET BY MOUTH EVERY DAY   feeding supplement (ENSURE COMPLETE) Liqd Take 237 mLs by mouth 2 (two) times daily between meals. What changed: when to take this   fluticasone 50 MCG/ACT nasal spray Commonly known as: FLONASE Place 2 sprays into both nostrils daily.   gabapentin 100 MG capsule Commonly known as: NEURONTIN TAKE 1 CAPSULE BY MOUTH THREE TIMES A DAY   GRAPE SEED PO Take 1 tablet by mouth daily.   losartan-hydrochlorothiazide 50-12.5 MG tablet Commonly known as: HYZAAR TAKE 1 TABLET BY MOUTH EVERY DAY FOR BLOOD PRESSURE   mirtazapine 30 MG tablet Commonly known as: REMERON TAKE 1 TABLET (30 MG TOTAL) BY MOUTH AT BEDTIME. FOR SLEEP.   montelukast 10 MG tablet Commonly known as: SINGULAIR Take 1 tablet (10 mg total) by mouth at bedtime. For allergies   naproxen sodium 220 MG tablet Commonly known as: ALEVE Take 220 mg by mouth in the morning and at bedtime.   rosuvastatin 5 MG tablet Commonly known as: CRESTOR TAKE 1 TABLET (5 MG TOTAL) BY MOUTH EVERY EVENING. FOR CHOLESTEROL.   vitamin B-12 1000 MCG tablet Commonly known as: CYANOCOBALAMIN Take 1,000 mcg by mouth daily.   Vitamin D3 25 MCG (1000 UT) Caps Take 1 capsule by mouth  daily.        Follow-up Information     Pleas Koch, NP Follow up in 1 week(s).   Specialty: Internal Medicine Contact information: [place] [address] [phone]                Allergies  Allergen Reactions   Codeine Sulfate     REACTION: hallucinations   Penicillins Anaphylaxis, Swelling and Rash   Wellbutrin [Bupropion] Other (See Comments)    Seizures and hallucinations    Consultations: None   Procedures/Studies: DG Chest Port 1 View  Result Date: 01/12/2021 CLINICAL DATA:  Possible sepsis. EXAM: PORTABLE CHEST  1 VIEW COMPARISON:  02/06/2017 FINDINGS: 1411 hours. The lungs are clear without focal pneumonia, edema, pneumothorax or pleural effusion. The cardiopericardial silhouette is within normal limits for size. The visualized bony structures of the thorax show no acute abnormality. Telemetry leads overlie the chest. IMPRESSION: No active disease. Electronically Signed   By: Misty Stanley M.D.   On: 01/12/2021 14:45     Discharge Exam: Vitals:   01/13/21 0400 01/13/21 0600  BP: (!) 118/51 (!) 151/78  Pulse: (!) 58   Resp: 17 16  Temp:  98.5 F (36.9 C)  SpO2: 98% 100%   Vitals:   01/13/21 0300 01/13/21 0400 01/13/21 0548 01/13/21 0600  BP: (!) 97/47 (!) 118/51  (!) 151/78  Pulse: (!) 59 (!) 58    Resp: 19 17  16   Temp:    98.5 F (36.9 C)  TempSrc:    Oral  SpO2: 95% 98%  100%  Weight:   74.9 kg   Height:   5\' 1"  (1.549 m)     General: Pt is alert, awake, not in acute distress Cardiovascular: RRR, S1/S2 +, no rubs, no gallops Respiratory: CTA bilaterally, no wheezing, no rhonchi Abdominal: Soft, NT, ND, bowel sounds + Extremities: no edema, no cyanosis    The results of significant diagnostics from this hospitalization (including imaging, microbiology, ancillary and laboratory) are listed below for reference.     Microbiology: Recent Results (from the past 240 hour(s))  Resp Panel by RT-PCR (Flu A&B, Covid)  Nasopharyngeal Swab     Status: None   Collection Time: 01/12/21  1:37 PM   Specimen: Nasopharyngeal Swab; Nasopharyngeal(NP) swabs in vial transport medium  Result Value Ref Range Status   SARS Coronavirus 2 by RT PCR NEGATIVE NEGATIVE Final    Comment: (NOTE) SARS-CoV-2 target nucleic acids are NOT DETECTED.  The SARS-CoV-2 RNA is generally detectable in upper respiratory specimens during the acute phase of infection. The lowest concentration of SARS-CoV-2 viral copies this assay can detect is 138 copies/mL. A negative result does not preclude SARS-Cov-2 infection and should not be used as the sole basis for treatment or other patient management decisions. A negative result may occur with  improper specimen collection/handling, submission of specimen other than nasopharyngeal swab, presence of viral mutation(s) within the areas targeted by this assay, and inadequate number of viral copies(<138 copies/mL). A negative result must be combined with clinical observations, patient history, and epidemiological information. The expected result is Negative.  Fact Sheet for Patients:  EntrepreneurPulse.com.au  Fact Sheet for Healthcare Providers:  IncredibleEmployment.be  This test is no t yet approved or cleared by the [place] FDA and  has been authorized for detection and/or diagnosis of SARS-CoV-2 by FDA under an Emergency Use Authorization (EUA). This EUA will remain  in effect (meaning this test can be used) for the duration of the COVID-19 declaration under Section 564(b)(1) of the Act, 21 U.S.C.section 360bbb-3(b)(1), unless the authorization is terminated  or revoked sooner.       Influenza A by PCR NEGATIVE NEGATIVE Final   Influenza B by PCR NEGATIVE NEGATIVE Final    Comment: (NOTE) The Xpert Xpress SARS-CoV-2/FLU/RSV plus assay is intended as an aid in the diagnosis of influenza from Nasopharyngeal swab specimens and should not be  used as a sole basis for treatment. Nasal washings and aspirates are unacceptable for Xpert Xpress SARS-CoV-2/FLU/RSV testing.  Fact Sheet for Patients: EntrepreneurPulse.com.au  Fact Sheet for Healthcare Providers: IncredibleEmployment.be  This test is not yet approved or  cleared by the [place] and has been authorized for detection and/or diagnosis of SARS-CoV-2 by FDA under an Emergency Use Authorization (EUA). This EUA will remain in effect (meaning this test can be used) for the duration of the COVID-19 declaration under Section 564(b)(1) of the Act, 21 U.S.C. section 360bbb-3(b)(1), unless the authorization is terminated or revoked.  Performed at [facility], [place] [address], [place], [place] [address]   Blood Culture (routine x 2)     Status: None (Preliminary result)   Collection Time: 01/12/21  1:40 PM   Specimen: BLOOD LEFT HAND  Result Value Ref Range Status   Specimen Description   Final    BLOOD LEFT HAND Performed at [facility] [address] [place], [place] [address]    Special Requests   Final    BOTTLES DRAWN AEROBIC AND ANAEROBIC Blood Culture adequate volume Performed at [facility] [address] [place], [place] [address]    Culture   Final    NO GROWTH < 12 HOURS Performed at [facility] [address], [place], [place] [address]    Report Status PENDING  Incomplete  Blood Culture (routine x 2)     Status: None (Preliminary result)   Collection Time: 01/12/21  1:40 PM   Specimen: BLOOD LEFT FOREARM  Result Value Ref Range Status   Specimen Description   Final    BLOOD LEFT FOREARM Performed at [facility] [address] [place], [place] [address]    Special Requests   Final    BOTTLES DRAWN AEROBIC AND ANAEROBIC Blood Culture adequate volume Performed at [facility] [address],  [place], [place] [address]    Culture   Final    NO GROWTH < 12 HOURS Performed at [facility] [address], [place], [place] [address]    Report Status PENDING  Incomplete     Labs: BNP (last 3 results) No results for input(s): BNP in the last 8760 hours. Basic Metabolic Panel: Recent Labs  Lab 01/12/21 1340 01/13/21 0500  NA 139 142  K 4.8 3.7  CL 102 107  CO2 27 29  GLUCOSE 213* 105*  BUN 19 13  CREATININE 0.72 0.58  CALCIUM 9.3 8.7*   Liver Function Tests: Recent Labs  Lab 01/12/21 1340  AST 18  ALT 5  ALKPHOS 60  BILITOT 0.3  PROT 7.1  ALBUMIN 3.9   No results for input(s): LIPASE, AMYLASE in the last 168 hours. No results for input(s): AMMONIA in the last 168 hours. CBC: Recent Labs  Lab 01/12/21 1340 01/13/21 0500  WBC 11.0* 8.3  NEUTROABS 9.4*  --   HGB 13.8 11.2*  HCT 43.6 35.7*  MCV 98.4 98.1  PLT 211 185   Cardiac Enzymes: Recent Labs  Lab 01/12/21 2242  CKTOTAL 30*   BNP: Invalid input(s): POCBNP CBG: No results for input(s): GLUCAP in the last 168 hours. D-Dimer No results for input(s): DDIMER in the last 72 hours. Hgb A1c No results for input(s): HGBA1C in the last 72 hours. Lipid Profile No results for input(s): CHOL, HDL, LDLCALC, TRIG, CHOLHDL, LDLDIRECT in the last 72 hours. Thyroid function studies No results for input(s): TSH, T4TOTAL, T3FREE, THYROIDAB in the last 72 hours.  Invalid input(s): FREET3 Anemia work up No results for input(s): VITAMINB12, FOLATE, FERRITIN, TIBC, IRON, RETICCTPCT in the last 72 hours. Urinalysis    Component Value Date/Time  COLORURINE YELLOW 01/12/2021 1442   APPEARANCEUR HAZY (A) 01/12/2021 1442   LABSPEC 1.018 01/12/2021 1442   PHURINE 6.0 01/12/2021 1442   GLUCOSEU NEGATIVE 01/12/2021 1442   HGBUR NEGATIVE 01/12/2021 1442   BILIRUBINUR NEGATIVE 01/12/2021 1442   BILIRUBINUR negative 12/03/2020 0839   KETONESUR 5 (A) 01/12/2021 1442   PROTEINUR NEGATIVE 01/12/2021 1442    UROBILINOGEN 0.2 12/03/2020 0839   UROBILINOGEN 0.2 02/06/2017 1236   NITRITE NEGATIVE 01/12/2021 1442   LEUKOCYTESUR NEGATIVE 01/12/2021 1442   Sepsis Labs Invalid input(s): PROCALCITONIN,  WBC,  LACTICIDVEN Microbiology Recent Results (from the past 240 hour(s))  Resp Panel by RT-PCR (Flu A&B, Covid) Nasopharyngeal Swab     Status: None   Collection Time: 01/12/21  1:37 PM   Specimen: Nasopharyngeal Swab; Nasopharyngeal(NP) swabs in vial transport medium  Result Value Ref Range Status   SARS Coronavirus 2 by RT PCR NEGATIVE NEGATIVE Final    Comment: (NOTE) SARS-CoV-2 target nucleic acids are NOT DETECTED.  The SARS-CoV-2 RNA is generally detectable in upper respiratory specimens during the acute phase of infection. The lowest concentration of SARS-CoV-2 viral copies this assay can detect is 138 copies/mL. A negative result does not preclude SARS-Cov-2 infection and should not be used as the sole basis for treatment or other patient management decisions. A negative result may occur with  improper specimen collection/handling, submission of specimen other than nasopharyngeal swab, presence of viral mutation(s) within the areas targeted by this assay, and inadequate number of viral copies(<138 copies/mL). A negative result must be combined with clinical observations, patient history, and epidemiological information. The expected result is Negative.  Fact Sheet for Patients:  EntrepreneurPulse.com.au  Fact Sheet for Healthcare Providers:  IncredibleEmployment.be  This test is no t yet approved or cleared by the [place] FDA and  has been authorized for detection and/or diagnosis of SARS-CoV-2 by FDA under an Emergency Use Authorization (EUA). This EUA will remain  in effect (meaning this test can be used) for the duration of the COVID-19 declaration under Section 564(b)(1) of the Act, 21 U.S.C.section 360bbb-3(b)(1), unless the  authorization is terminated  or revoked sooner.       Influenza A by PCR NEGATIVE NEGATIVE Final   Influenza B by PCR NEGATIVE NEGATIVE Final    Comment: (NOTE) The Xpert Xpress SARS-CoV-2/FLU/RSV plus assay is intended as an aid in the diagnosis of influenza from Nasopharyngeal swab specimens and should not be used as a sole basis for treatment. Nasal washings and aspirates are unacceptable for Xpert Xpress SARS-CoV-2/FLU/RSV testing.  Fact Sheet for Patients: EntrepreneurPulse.com.au  Fact Sheet for Healthcare Providers: IncredibleEmployment.be  This test is not yet approved or cleared by the [place] FDA and has been authorized for detection and/or diagnosis of SARS-CoV-2 by FDA under an Emergency Use Authorization (EUA). This EUA will remain in effect (meaning this test can be used) for the duration of the COVID-19 declaration under Section 564(b)(1) of the Act, 21 U.S.C. section 360bbb-3(b)(1), unless the authorization is terminated or revoked.  Performed at [facility], [place] [address] [place], [place] [address]   Blood Culture (routine x 2)     Status: None (Preliminary result)   Collection Time: 01/12/21  1:40 PM   Specimen: BLOOD LEFT HAND  Result Value Ref Range Status   Specimen Description   Final    BLOOD LEFT HAND Performed at [facility] [address], [place], [place] [address]    Special Requests   Final  BOTTLES DRAWN AEROBIC AND ANAEROBIC Blood Culture adequate volume Performed at [facility] [address] [place], [place] [address]    Culture   Final    NO GROWTH < 12 HOURS Performed at [facility] [address], [place], [place] [address]    Report Status PENDING  Incomplete  Blood Culture (routine x 2)     Status: None (Preliminary result)   Collection Time: 01/12/21  1:40 PM   Specimen: BLOOD LEFT FOREARM  Result Value Ref Range  Status   Specimen Description   Final    BLOOD LEFT FOREARM Performed at [facility] [address] [place], [place] [address]    Special Requests   Final    BOTTLES DRAWN AEROBIC AND ANAEROBIC Blood Culture adequate volume Performed at [facility] [address] [place], [place] [address]    Culture   Final    NO GROWTH < 12 HOURS Performed at [facility] [address] [place], [place] [address]    Report Status PENDING  Incomplete     Time coordinating discharge: Over 30 minutes  SIGNED:   Darliss Cheney, MD  [facility] 01/13/2021, 10:50 AM  If 7PM-7AM, please contact night-coverage [email or web address]

## 2021-01-13 NOTE — Plan of Care (Signed)
  Problem: Activity: Goal: Risk for activity intolerance will decrease Reactivated

## 2021-01-13 NOTE — ED Notes (Signed)
Patient is resting comfortably. 

## 2021-01-13 NOTE — ED Notes (Signed)
Vital signs stable. 

## 2021-01-14 ENCOUNTER — Other Ambulatory Visit: Payer: Self-pay | Admitting: Neurology

## 2021-01-14 LAB — URINE CULTURE: Culture: 30000 — AB

## 2021-01-14 NOTE — Telephone Encounter (Signed)
Patient seen in [place] has follow up July 25, enough given till appt with Dr.Rebecca Tat, no further refills until seen

## 2021-01-17 LAB — CULTURE, BLOOD (ROUTINE X 2)
Culture: NO GROWTH
Culture: NO GROWTH
Special Requests: ADEQUATE
Special Requests: ADEQUATE

## 2021-01-19 ENCOUNTER — Other Ambulatory Visit: Payer: Self-pay | Admitting: Neurology

## 2021-01-28 ENCOUNTER — Other Ambulatory Visit: Payer: Self-pay | Admitting: Primary Care

## 2021-01-28 ENCOUNTER — Other Ambulatory Visit: Payer: Self-pay | Admitting: Neurology

## 2021-01-28 DIAGNOSIS — J309 Allergic rhinitis, unspecified: Secondary | ICD-10-CM

## 2021-02-05 ENCOUNTER — Inpatient Hospital Stay: Admission: RE | Admit: 2021-02-05 | Payer: Medicare Other | Source: Ambulatory Visit

## 2021-02-13 NOTE — Progress Notes (Signed)
Assessment/Plan:   1.  Parkinsons Disease  -Continue carbidopa/levodopa 25/100, 3 tablets 3 times per day  2.  Depression  -On Lexapro  -On mirtazapine  -Managed by PCP  3.  RBD and GAD  -On clonazepam 0.5 mg, half tablet at bedtime and will take half tablet as needed in the day for anxiety.  -PDMP reviewed.  No red flags.  4.  Chronic headache  -On gabapentin, 100 mg 3 times per day.  -Saw Dr. Tommi Rumps in the remote past.  5.  MCI  -Patient last had neurocognitive testing in August, 2021 with Dr. Nicole Kindred.   Subjective:   [name] was seen today in follow up for Parkinsons disease.  My previous records were reviewed prior to todays visit as well as outside records available to me. Pt denies falls.  Pt denies lightheadedness, near syncope.  No hallucinations.  Mood has been good.  She did recently have COVID (one positive test and then one neg test).  Only sx was sneezing.  Medical records are reviewed.  Her daughter is starting a diet and pt will be joining her in the efforts.  Current prescribed movement disorder medications:  Carbidopa/levodopa 25/100, 3 tablets 3 times per day Mirtazapine, 30 mg at night Gabapentin, 100 mg 3 times per day Clonazepam, 0.25 mg twice daily    ALLERGIES:   Allergies  Allergen Reactions   Codeine Sulfate     REACTION: hallucinations   Penicillins Anaphylaxis, Swelling and Rash   Wellbutrin [Bupropion] Other (See Comments)    Seizures and hallucinations    CURRENT MEDICATIONS:  Outpatient Encounter Medications as of 02/16/2021  Medication Sig   aspirin 81 MG tablet Take 81 mg by mouth daily.   Bioflavonoid Products (GRAPE SEED PO) Take 1 tablet by mouth daily.    calcium carbonate (TUMS EX) 750 MG chewable tablet Chew 1 tablet by mouth as needed.    Calcium Carbonate-Vitamin D (CALCIUM + D PO) Take 600 mg by mouth daily.   cetirizine (ZYRTEC) 10 MG tablet Take 10 mg by mouth daily.   Cholecalciferol (VITAMIN D3) 1000  UNITS CAPS Take 1 capsule by mouth daily.   clonazePAM (KLONOPIN) 0.5 MG tablet TAKE 1/2 TABLET BY MOUTH AT BEDTIME AND 1/2 DAILY AS NEEDED   escitalopram (LEXAPRO) 10 MG tablet TAKE 1 TABLET BY MOUTH EVERY DAY   feeding supplement (ENSURE COMPLETE) LIQD Take 237 mLs by mouth 2 (two) times daily between meals.   fluticasone (FLONASE) 50 MCG/ACT nasal spray Place 2 sprays into both nostrils daily.   losartan-hydrochlorothiazide (HYZAAR) 50-12.5 MG tablet TAKE 1 TABLET BY MOUTH EVERY DAY FOR BLOOD PRESSURE   mirtazapine (REMERON) 30 MG tablet TAKE 1 TABLET (30 MG TOTAL) BY MOUTH AT BEDTIME. FOR SLEEP.   montelukast (SINGULAIR) 10 MG tablet TAKE 1 TABLET (10 MG TOTAL) BY MOUTH AT BEDTIME. FOR ALLERGIES   Multiple Vitamins-Minerals (CENTRUM SILVER ADULT 50+) TABS Take by mouth.   naproxen sodium (ALEVE) 220 MG tablet Take 220 mg by mouth in the morning and at bedtime.   rosuvastatin (CRESTOR) 5 MG tablet TAKE 1 TABLET (5 MG TOTAL) BY MOUTH EVERY EVENING. FOR CHOLESTEROL.   vitamin B-12 (CYANOCOBALAMIN) 1000 MCG tablet Take 1,000 mcg by mouth daily.   [DISCONTINUED] carbidopa-levodopa (SINEMET IR) 25-100 MG tablet TAKE 3 TABLETS BY MOUTH 3 TIMES A DAY   [DISCONTINUED] gabapentin (NEURONTIN) 100 MG capsule TAKE 1 CAPSULE BY MOUTH THREE TIMES A DAY   carbidopa-levodopa (SINEMET IR) 25-100 MG tablet Take  3 tablets by mouth 3 (three) times daily.   gabapentin (NEURONTIN) 100 MG capsule Take 1 capsule (100 mg total) by mouth 3 (three) times daily.   [DISCONTINUED] simvastatin (ZOCOR) 20 MG tablet TAKE 1 TABLET BY MOUTH EVERY DAY AT BEDTIME FOR CHOLESTEROL   No facility-administered encounter medications on file as of 02/16/2021.    Objective:   PHYSICAL EXAMINATION:    VITALS:   Vitals:   02/16/21 1456  BP: 126/72  Pulse: 80  SpO2: 98%  Weight: 162 lb (73.5 kg)  Height: '5\' 1"'$  (1.549 m)    GEN:  The patient appears stated age and is in NAD. HEENT:  Normocephalic, atraumatic.  The mucous  membranes are moist. The superficial temporal arteries are without ropiness or tenderness.   Neurological examination:  Orientation: The patient is alert and oriented x3. Cranial nerves: There is good facial symmetry with minimal facial hypomimia. The speech is fluent and clear. Soft palate rises symmetrically and there is no tongue deviation. Hearing is intact to conversational tone. Sensation: Sensation is intact to light touch throughout Motor: Strength is at least antigravity x4.  Movement examination: Tone: There is nl tone in the UE/LE Abnormal movements: none Coordination:  There is minimal decremation with RAM's, with finger taps bilaterally Gait and Station: Patient pushes off of the chair to arise. The patient's stride length is slow and tenuous.    I have reviewed and interpreted the following labs independently    Chemistry      Component Value Date/Time   NA 142 01/13/2021 0500   K 3.7 01/13/2021 0500   CL 107 01/13/2021 0500   CO2 29 01/13/2021 0500   BUN 13 01/13/2021 0500   CREATININE 0.58 01/13/2021 0500   CREATININE 0.71 12/14/2019 1504      Component Value Date/Time   CALCIUM 8.7 (L) 01/13/2021 0500   ALKPHOS 60 01/12/2021 1340   AST 18 01/12/2021 1340   ALT 5 01/12/2021 1340   BILITOT 0.3 01/12/2021 1340       Lab Results  Component Value Date   WBC 8.3 01/13/2021   HGB 11.2 (L) 01/13/2021   HCT 35.7 (L) 01/13/2021   MCV 98.1 01/13/2021   PLT 185 01/13/2021    Lab Results  Component Value Date   TSH 1.04 12/07/2017     Total time spent on today's visit was 20 minutes, including both face-to-face time and nonface-to-face time.  Time included that spent on review of records (prior notes available to me/labs/imaging if pertinent), discussing treatment and goals, answering patient's questions and coordinating care.  Cc:  Pleas Koch, NP

## 2021-02-16 ENCOUNTER — Other Ambulatory Visit: Payer: Self-pay

## 2021-02-16 ENCOUNTER — Encounter: Payer: Self-pay | Admitting: Neurology

## 2021-02-16 ENCOUNTER — Ambulatory Visit: Payer: Medicare Other | Admitting: Neurology

## 2021-02-16 ENCOUNTER — Ambulatory Visit (INDEPENDENT_AMBULATORY_CARE_PROVIDER_SITE_OTHER): Payer: Medicare Other | Admitting: Neurology

## 2021-02-16 VITALS — BP 126/72 | HR 80 | Ht 61.0 in | Wt 162.0 lb

## 2021-02-16 DIAGNOSIS — G20 Parkinson's disease: Secondary | ICD-10-CM

## 2021-02-16 MED ORDER — GABAPENTIN 100 MG PO CAPS: 100.0000 mg | ORAL_CAPSULE | Freq: Three times a day (TID) | ORAL | 1 refills | Status: DC

## 2021-02-16 MED ORDER — CARBIDOPA-LEVODOPA 25-100 MG PO TABS: 3.0000 | ORAL_TABLET | Freq: Three times a day (TID) | ORAL | 1 refills | Status: DC

## 2021-02-16 NOTE — Patient Instructions (Signed)
Parkinson's Disease Parkinson's disease causes problems with movements. It is a long-term condition. It gets worse over time (is progressive). It affects each person in different ways. It makes it harder for you to: Control how your body moves. Move your body normally. The condition can range from mild to very bad (advanced). What are the causes? This condition results from a loss of brain cells called neurons. These brain cells make a chemical called dopamine, which is needed to control body movement. As the condition gets worse, the brain cells make less dopamine. Thismakes it hard to move or control your movements. The exact cause of this condition is not known. What increases the risk? Being female. Being age 87 or older. Having family members who had Parkinson's disease. Having had an injury to the brain. Being very sad (depressed). Being around things that are harmful or poisonous. What are the signs or symptoms? Symptoms of this condition can vary. The main symptoms have to do with movement. These include: A tremor or shaking while you are resting that you cannot control. Stiffness in your neck, arms, and legs. Slowing of movement. This may include: Losing expressions of the face. Having trouble making small movements that are needed to button your clothing or brush your teeth. Walking in a way that is not normal. You may walk with short, shuffling steps. Loss of balance when standing. You may sway, fall backward, or have trouble making turns. Other symptoms include: Being very sad, worried, or confused. Seeing or hearing things that are not real. Losing thinking abilities (dementia). Trouble speaking or swallowing. Having a hard time pooping (constipation). Needing to pee right away, peeing often, or not being able to control when you pee or poop. Sleep problems. How is this treated? There is no cure. The goal of treatment is to manage your symptoms. Treatment may  include: Medicines. Therapy to help with talking or movement. Surgery to reduce shaking and other movements that you cannot control. Follow these instructions at home: Medicines Take over-the-counter and prescription medicines only as told by your doctor. Avoid taking pain or sleeping medicines. Eating and drinking Follow instructions from your doctor about what you cannot eat or drink. Do not drink alcohol. Activity Talk with your doctor about if it is safe for you to drive. Do exercises as told by your doctor. Lifestyle     Put in grab bars and railings in your home. These help to prevent falls. Do not use any products that contain nicotine or tobacco, such as cigarettes, e-cigarettes, and chewing tobacco. If you need help quitting, ask your doctor. Join a support group. General instructions Talk with your doctor about what you need help with and what your safety needs are. Keep all follow-up visits as told by your doctor, including any therapy visits to help with talking or moving. This is important. Contact a doctor if: Medicines do not help your symptoms. You feel off-balance. You fall at home. You need more help at home. You have trouble swallowing. You have a very hard time pooping. You have a lot of side effects from your medicines. You feel very sad, worried, or confused. Get help right away if: You were hurt in a fall. You see or hear things that are not real. You cannot swallow without choking. You have chest pain or trouble breathing. You do not feel safe at home. You have thoughts about hurting yourself or others. If you ever feel like you may hurt yourself or others, or have  thoughts about taking your own life, get help right away. You can go to your nearest emergency department or call: Your local emergency services (911 in the [place] A suicide crisis helpline, such as the [facility] at 208-740-5358. This is open 24 hours a  day. Summary This condition causes problems with movements. It is a long-term condition. It gets worse over time. There is no cure. Treatment focuses on managing your symptoms. Talk with your doctor about what you need help with and what your safety needs are. Keep all follow-up visits as told by your doctor. This is important. This information is not intended to replace advice given to you by your health care provider. Make sure you discuss any questions you have with your healthcare provider. Document Revised: 06/24/2020 Document Reviewed: 09/28/2018 Elsevier Patient Education  [place].

## 2021-02-19 ENCOUNTER — Ambulatory Visit: Payer: Medicare Other

## 2021-03-19 ENCOUNTER — Other Ambulatory Visit: Payer: Self-pay | Admitting: Neurology

## 2021-04-06 ENCOUNTER — Telehealth: Payer: Self-pay | Admitting: Primary Care

## 2021-04-06 NOTE — Telephone Encounter (Signed)
Pt's daughter called to find out when pt is due for prolia. She wants to make sure she doesn't miss it.

## 2021-04-10 NOTE — Telephone Encounter (Signed)
Patient's daughter advised not due until November 5th

## 2021-05-07 ENCOUNTER — Ambulatory Visit: Payer: Medicare Other | Admitting: Primary Care

## 2021-05-14 ENCOUNTER — Telehealth: Payer: Self-pay

## 2021-05-14 DIAGNOSIS — M810 Age-related osteoporosis without current pathological fracture: Secondary | ICD-10-CM

## 2021-05-14 NOTE — Telephone Encounter (Signed)
Benefits submitted-pending Next injection due after 05/31/21   Patient has an appointment with [name] on 06/09/21 could do it then but would need labs ahead of time

## 2021-05-27 NOTE — Addendum Note (Signed)
Addended by: Kris Mouton on: 05/27/2021 12:01 PM   Modules accepted: Orders

## 2021-05-27 NOTE — Telephone Encounter (Signed)
Benefits received. OOP cost is $0. Spoke with patient's daughter, Ms Ayesha Rumpf. Lab 05/29/21 and Office visit on 06/09/21.

## 2021-05-27 NOTE — Addendum Note (Signed)
Addended by: Kris Mouton on: 05/27/2021 12:36 PM   Modules accepted: Orders

## 2021-05-29 ENCOUNTER — Other Ambulatory Visit: Payer: Medicare Other

## 2021-06-02 ENCOUNTER — Other Ambulatory Visit (INDEPENDENT_AMBULATORY_CARE_PROVIDER_SITE_OTHER): Payer: Medicare Other

## 2021-06-02 ENCOUNTER — Other Ambulatory Visit: Payer: Self-pay

## 2021-06-02 DIAGNOSIS — M810 Age-related osteoporosis without current pathological fracture: Secondary | ICD-10-CM | POA: Diagnosis not present

## 2021-06-03 LAB — BASIC METABOLIC PANEL
BUN: 25 mg/dL — ABNORMAL HIGH (ref 6–23)
CO2: 31 mEq/L (ref 19–32)
Calcium: 9.9 mg/dL (ref 8.4–10.5)
Chloride: 99 mEq/L (ref 96–112)
Creatinine, Ser: 0.66 mg/dL (ref 0.40–1.20)
GFR: 85.2 mL/min (ref >60.00)
Glucose, Bld: 126 mg/dL — ABNORMAL HIGH (ref 70–99)
Potassium: 4.4 mEq/L (ref 3.5–5.1)
Sodium: 140 mEq/L (ref 135–145)

## 2021-06-03 NOTE — Telephone Encounter (Signed)
Lab was done on 06/02/21 Calcium normal 9.9 CrCl 84.14 mL/min

## 2021-06-09 ENCOUNTER — Ambulatory Visit: Payer: Medicare Other | Admitting: Primary Care

## 2021-06-12 ENCOUNTER — Other Ambulatory Visit: Payer: Self-pay

## 2021-06-12 ENCOUNTER — Ambulatory Visit (INDEPENDENT_AMBULATORY_CARE_PROVIDER_SITE_OTHER): Payer: Medicare Other | Admitting: Primary Care

## 2021-06-12 VITALS — BP 120/62 | HR 68 | Temp 97.2°F | Ht 61.0 in | Wt 160.0 lb

## 2021-06-12 DIAGNOSIS — R300 Dysuria: Secondary | ICD-10-CM | POA: Diagnosis not present

## 2021-06-12 DIAGNOSIS — E119 Type 2 diabetes mellitus without complications: Secondary | ICD-10-CM

## 2021-06-12 DIAGNOSIS — M810 Age-related osteoporosis without current pathological fracture: Secondary | ICD-10-CM | POA: Diagnosis not present

## 2021-06-12 LAB — POCT GLYCOSYLATED HEMOGLOBIN (HGB A1C): Hemoglobin A1C: 7 % — AB (ref 4.0–5.6)

## 2021-06-12 LAB — POC URINALSYSI DIPSTICK (AUTOMATED)
Bilirubin, UA: NEGATIVE
Glucose, UA: NEGATIVE
Ketones, UA: NEGATIVE
Nitrite, UA: NEGATIVE
Protein, UA: POSITIVE — AB
Spec Grav, UA: 1.02 (ref 1.010–1.025)
Urobilinogen, UA: 0.2 E.U./dL
pH, UA: 6 (ref 5.0–8.0)

## 2021-06-12 MED ORDER — SULFAMETHOXAZOLE-TRIMETHOPRIM 800-160 MG PO TABS: 1.0000 | ORAL_TABLET | Freq: Two times a day (BID) | ORAL | 0 refills | Status: DC

## 2021-06-12 MED ORDER — DENOSUMAB 60 MG/ML ~~LOC~~ SOSY: 60.0000 mg | PREFILLED_SYRINGE | Freq: Once | SUBCUTANEOUS | 0 refills | Status: DC

## 2021-06-12 MED ORDER — DENOSUMAB 60 MG/ML ~~LOC~~ SOSY
60.0000 mg | PREFILLED_SYRINGE | Freq: Once | SUBCUTANEOUS | Status: AC
  Administered 2021-06-12: 60 mg via SUBCUTANEOUS

## 2021-06-12 NOTE — Assessment & Plan Note (Signed)
Increased but overall under control given her age.  Discussed to work on diet and exercise. Continue off medications for now.   We will see her back in 6 months for CPE and recheck A1C.  Foot exam today. Discussed to schedule eye exam. Managed on ARB and statin. Pneumonia vaccine UTD

## 2021-06-12 NOTE — Assessment & Plan Note (Signed)
UA today with 2+ leuks, 3+ blood.  Culture pending.  She doesn't appear to be experiencing renal stones, she agrees.  Will treat with Bactrim DS x 3 days.

## 2021-06-12 NOTE — Assessment & Plan Note (Signed)
Reviewed bone density scan from 2021. Prolia injection provided today.  Repeat bone density scan due in early 2023.

## 2021-06-12 NOTE — Patient Instructions (Addendum)
Start Bactrim DS (sulfamethoxazole/trimethoprim) tablets for urinary tract infection. Take 1 tablet by mouth twice daily for 3 days.  Be sure to watch your diet as your diabetes has worsened over the last year.   Schedule your physical for 6 months.  It was a pleasure to see you today!  Diabetes Mellitus and Nutrition, Adult When you have diabetes, or diabetes mellitus, it is very important to have healthy eating habits because your blood sugar (glucose) levels are greatly affected by what you eat and drink. Eating healthy foods in the right amounts, at about the same times every day, can help you: Manage your blood glucose. Lower your risk of heart disease. Improve your blood pressure. Reach or maintain a healthy weight. What can affect my meal plan? Every person with diabetes is different, and each person has different needs for a meal plan. Your health care provider may recommend that you work with a dietitian to make a meal plan that is best for you. Your meal plan may vary depending on factors such as: The calories you need. The medicines you take. Your weight. Your blood glucose, blood pressure, and cholesterol levels. Your activity level. Other health conditions you have, such as heart or kidney disease. How do carbohydrates affect me? Carbohydrates, also called carbs, affect your blood glucose level more than any other type of food. Eating carbs raises the amount of glucose in your blood. It is important to know how many carbs you can safely have in each meal. This is different for every person. Your dietitian can help you calculate how many carbs you should have at each meal and for each snack. How does alcohol affect me? Alcohol can cause a decrease in blood glucose (hypoglycemia), especially if you use insulin or take certain diabetes medicines by mouth. Hypoglycemia can be a life-threatening condition. Symptoms of hypoglycemia, such as sleepiness, dizziness, and confusion, are  similar to symptoms of having too much alcohol. Do not drink alcohol if: Your health care provider tells you not to drink. You are pregnant, may be pregnant, or are planning to become pregnant. If you drink alcohol: Limit how much you have to: 0-1 drink a day for women. 0-2 drinks a day for men. Know how much alcohol is in your drink. In the [place] one drink equals one 12 oz bottle of beer (355 mL), one 5 oz glass of wine (148 mL), or one 1 oz glass of hard liquor (44 mL). Keep yourself hydrated with water, diet soda, or unsweetened iced tea. Keep in mind that regular soda, juice, and other mixers may contain a lot of sugar and must be counted as carbs. What are tips for following this plan? Reading food labels Start by checking the serving size on the Nutrition Facts label of packaged foods and drinks. The number of calories and the amount of carbs, fats, and other nutrients listed on the label are based on one serving of the item. Many items contain more than one serving per package. Check the total grams (g) of carbs in one serving. Check the number of grams of saturated fats and trans fats in one serving. Choose foods that have a low amount or none of these fats. Check the number of milligrams (mg) of salt (sodium) in one serving. Most people should limit total sodium intake to less than 2,300 mg per day. Always check the nutrition information of foods labeled as "low-fat" or "nonfat." These foods may be higher in added sugar or refined carbs  and should be avoided. Talk to your dietitian to identify your daily goals for nutrients listed on the label. Shopping Avoid buying canned, pre-made, or processed foods. These foods tend to be high in fat, sodium, and added sugar. Shop around the outside edge of the grocery store. This is where you will most often find fresh fruits and vegetables, bulk grains, fresh meats, and fresh dairy products. Cooking Use low-heat cooking methods, such as baking,  instead of high-heat cooking methods, such as deep frying. Cook using healthy oils, such as olive, canola, or sunflower oil. Avoid cooking with butter, cream, or high-fat meats. Meal planning Eat meals and snacks regularly, preferably at the same times every day. Avoid going long periods of time without eating. Eat foods that are high in fiber, such as fresh fruits, vegetables, beans, and whole grains. Eat 4-6 oz (112-168 g) of lean protein each day, such as lean meat, chicken, fish, eggs, or tofu. One ounce (oz) (28 g) of lean protein is equal to: 1 oz (28 g) of meat, chicken, or fish. 1 egg.  cup (62 g) of tofu. Eat some foods each day that contain healthy fats, such as avocado, nuts, seeds, and fish. What foods should I eat? Fruits Berries. Apples. Oranges. Peaches. Apricots. Plums. Grapes. Mangoes. Papayas. Pomegranates. Kiwi. Cherries. Vegetables Leafy greens, including lettuce, spinach, kale, chard, collard greens, mustard greens, and cabbage. Beets. Cauliflower. Broccoli. Carrots. Green beans. Tomatoes. Peppers. Onions. Cucumbers. Brussels sprouts. Grains Whole grains, such as whole-wheat or whole-grain bread, crackers, tortillas, cereal, and pasta. Unsweetened oatmeal. Quinoa. Brown or wild rice. Meats and other proteins Seafood. Poultry without skin. Lean cuts of poultry and beef. Tofu. Nuts. Seeds. Dairy Low-fat or fat-free dairy products such as milk, yogurt, and cheese. The items listed above may not be a complete list of foods and beverages you can eat and drink. Contact a dietitian for more information. What foods should I avoid? Fruits Fruits canned with syrup. Vegetables Canned vegetables. Frozen vegetables with butter or cream sauce. Grains Refined white flour and flour products such as bread, pasta, snack foods, and cereals. Avoid all processed foods. Meats and other proteins Fatty cuts of meat. Poultry with skin. Breaded or fried meats. Processed meat. Avoid  saturated fats. Dairy Full-fat yogurt, cheese, or milk. Beverages Sweetened drinks, such as soda or iced tea. The items listed above may not be a complete list of foods and beverages you should avoid. Contact a dietitian for more information. Questions to ask a health care provider Do I need to meet with a certified diabetes care and education specialist? Do I need to meet with a dietitian? What number can I call if I have questions? When are the best times to check my blood glucose? Where to find more information: American Diabetes Association: diabetes.org Academy of Nutrition and Dietetics: eatright.[employer] of Diabetes and Digestive and Kidney Diseases: [employer] Association of Diabetes Care & Education Specialists: diabeteseducator.org Summary It is important to have healthy eating habits because your blood sugar (glucose) levels are greatly affected by what you eat and drink. It is important to use alcohol carefully. A healthy meal plan will help you manage your blood glucose and lower your risk of heart disease. Your health care provider may recommend that you work with a dietitian to make a meal plan that is best for you. This information is not intended to replace advice given to you by your health care provider. Make sure you discuss any questions you have with your  health care provider. Document Revised: 02/13/2020 Document Reviewed: 02/13/2020 Elsevier Patient Education  [name].

## 2021-06-12 NOTE — Progress Notes (Signed)
Subjective:    Patient ID: [name], female    DOB: 09-Aug-1945, 75 y.o.   MRN: [id number]  HPI  [name] is a very pleasant 75 y.o. female with a history of type 2 diabetes, hypertension, cystitis, kidney stones, vascular parkinson's, osteoporosis who presents today for diabetes follow up, UTI symptoms, and for Prolia injection.  1) Type 2 Diabetes:  Current medications include: None  She is checking his/her blood glucose 0 times daily.  Last A1C: 6.9 in April 2022,  Last Eye Exam: UTD Last Foot Exam: Due Pneumonia Vaccination: 2015 Urine Microalbumin: None. Managed on ARB Statin: Rosuvastatin   Dietary changes since last visit: None. Mostly snacks.   Exercise: Some walking.  2) Dysuria: Symptoms began about 2 weeks ago. She also endorses increased frequency, urgency, difficulty urinating, lower back pain She has noticed dark red spotting on the toilet paper a few times.   She denies fevers, abdominal pain.    Review of Systems  Eyes:  Negative for visual disturbance.  Respiratory:  Negative for shortness of breath.   Cardiovascular:  Negative for chest pain.  Neurological:  Negative for dizziness, numbness and headaches.        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    "can't do either"   DVT (deep venous thrombosis), left "early 2000's"   RLE   Dysmetabolic syndrome X    Essential hypertension, benign    Headache(784.0) 05/2011   "pretty often since they put me on Parkinson's medicine"   History of shingles    "as a teen and in my 67's"   Mixed hyperlipidemia    Morbid obesity ([place])    Neurodegenerative gait disorder    Personal history of thrombophlebitis    Syncope and collapse 12/06/11   "this am; I pass out fairly often"   Type II or unspecified type  diabetes mellitus without mention of complication, not stated as uncontrolled 12/06/11   "not anymore; had lap band OR"    Social History   Socioeconomic History   Marital status: Widowed    Spouse name: Not on file   Number of children: 2   Years of education: Not on file   Highest education level: Some college, no degree  Occupational History   Occupation: Secondary school teacher: [employer]    Comment: retired, 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   Daughter is HPOA.   DNR.   Social Determinants of Health   Financial Resource Strain: Not on file  Food Insecurity: Not on file  Transportation Needs: Not on file  Physical Activity: Not on file  Stress: Not on file  Social Connections: Not on file  Intimate Partner Violence: Not on file    Past Surgical History:  Procedure Laterality Date   bladder tack  11/1995  BREAST BIOPSY  05/20/2000   bilaterally   BREAST CYST ASPIRATION  12/1998   bilaterally   CARDIAC CATHETERIZATION  2004   CATARACT EXTRACTION W/ INTRAOCULAR LENS  IMPLANT, BILATERAL     FRACTURE SURGERY  02/09/2006   bilateral elbows   FRACTURE SURGERY     right knee   FRACTURE SURGERY     tib plateau   KNEE ARTHROSCOPY  12/12/2002   right    LAPAROSCOPIC GASTRIC BANDING  2008   SEPTOPLASTY  08/1998   with antral window    TONSILLECTOMY AND ADENOIDECTOMY  1970   TUBAL LIGATION  5's    Family History  Problem Relation Age of Onset   Asthma Mother    COPD Father    Heart disease Father    Diabetes Sister    Vision loss Sister        legally blind   Hypertension Child    Hyperlipidemia Child    Diabetes Child     Diabetes Son    Breast cancer Neg Hx     Allergies  Allergen Reactions   Codeine Sulfate     REACTION: hallucinations   Penicillins Anaphylaxis, Swelling and Rash   Wellbutrin [Bupropion] Other (See Comments)    Seizures and hallucinations    Current Outpatient Medications on File Prior to Visit  Medication Sig Dispense Refill   aspirin 81 MG tablet Take 81 mg by mouth daily.     Bioflavonoid Products (GRAPE SEED PO) Take 1 tablet by mouth daily.      calcium carbonate (TUMS EX) 750 MG chewable tablet Chew 1 tablet by mouth as needed.      Calcium Carbonate-Vitamin D (CALCIUM + D PO) Take 600 mg by mouth daily.     carbidopa-levodopa (SINEMET IR) 25-100 MG tablet Take 3 tablets by mouth 3 (three) times daily. 810 tablet 1   cetirizine (ZYRTEC) 10 MG tablet Take 10 mg by mouth daily.     Cholecalciferol (VITAMIN D3) 1000 UNITS CAPS Take 1 capsule by mouth daily.     clonazePAM (KLONOPIN) 0.5 MG tablet TAKE 1/2 TABLET BY MOUTH AT BEDTIME AND 1/2 DAILY AS NEEDED 90 tablet 1   escitalopram (LEXAPRO) 10 MG tablet TAKE 1 TABLET BY MOUTH EVERY DAY 90 tablet 2   feeding supplement (ENSURE COMPLETE) LIQD Take 237 mLs by mouth 2 (two) times daily between meals. 1 Bottle 11   fluticasone (FLONASE) 50 MCG/ACT nasal spray Place 2 sprays into both nostrils daily.     gabapentin (NEURONTIN) 100 MG capsule Take 1 capsule (100 mg total) by mouth 3 (three) times daily. 270 capsule 1   losartan-hydrochlorothiazide (HYZAAR) 50-12.5 MG tablet TAKE 1 TABLET BY MOUTH EVERY DAY FOR BLOOD PRESSURE 90 tablet 3   mirtazapine (REMERON) 30 MG tablet TAKE 1 TABLET (30 MG TOTAL) BY MOUTH AT BEDTIME. FOR SLEEP. 90 tablet 2   montelukast (SINGULAIR) 10 MG tablet TAKE 1 TABLET (10 MG TOTAL) BY MOUTH AT BEDTIME. FOR ALLERGIES 90 tablet 2   Multiple Vitamins-Minerals (CENTRUM SILVER ADULT 50+) TABS Take by mouth.     naproxen sodium (ALEVE) 220 MG tablet Take 220 mg by mouth in the morning and at bedtime.      rosuvastatin (CRESTOR) 5 MG tablet TAKE 1 TABLET (5 MG TOTAL) BY MOUTH EVERY EVENING. FOR CHOLESTEROL. 90 tablet 3   vitamin B-12 (CYANOCOBALAMIN) 1000 MCG tablet Take 1,000 mcg by mouth daily.     [DISCONTINUED] simvastatin (ZOCOR) 20 MG tablet TAKE 1 TABLET BY  MOUTH EVERY DAY AT BEDTIME FOR CHOLESTEROL 90 tablet 0   No current facility-administered medications on file prior to visit.    BP 120/62   Pulse 68   Temp (!) 97.2 F (36.2 C) (Temporal)   Ht 5\' 1"  (1.549 m)   Wt 160 lb (72.6 kg)   SpO2 97%   BMI 30.23 kg/m  Objective:   Physical Exam Cardiovascular:     Rate and Rhythm: Normal rate and regular rhythm.  Pulmonary:     Effort: Pulmonary effort is normal.     Breath sounds: Normal breath sounds.  Musculoskeletal:     Cervical back: Neck supple.  Skin:    General: Skin is warm and dry.  Psychiatric:        Mood and Affect: Mood normal.          Assessment & Plan:      This visit occurred during the SARS-CoV-2 public health emergency.  Safety protocols were in place, including screening questions prior to the visit, additional usage of staff PPE, and extensive cleaning of exam room while observing appropriate contact time as indicated for disinfecting solutions.

## 2021-06-13 LAB — URINE CULTURE
MICRO NUMBER:: 12657412
SPECIMEN QUALITY:: ADEQUATE

## 2021-07-14 ENCOUNTER — Other Ambulatory Visit: Payer: Self-pay | Admitting: Primary Care

## 2021-07-14 DIAGNOSIS — I10 Essential (primary) hypertension: Secondary | ICD-10-CM

## 2021-08-19 ENCOUNTER — Other Ambulatory Visit: Payer: Self-pay | Admitting: Neurology

## 2021-08-19 ENCOUNTER — Other Ambulatory Visit: Payer: Self-pay | Admitting: Primary Care

## 2021-08-19 DIAGNOSIS — I10 Essential (primary) hypertension: Secondary | ICD-10-CM

## 2021-08-19 DIAGNOSIS — J309 Allergic rhinitis, unspecified: Secondary | ICD-10-CM

## 2021-08-19 DIAGNOSIS — F341 Dysthymic disorder: Secondary | ICD-10-CM

## 2021-08-19 NOTE — Progress Notes (Signed)
Subjective:   [name] is a 76 y.o. female who presents for Medicare Annual (Subsequent) preventive examination.  I connected with Sanjuan Dame today by telephone and verified that I am speaking with the correct person using two identifiers. Location patient: home Location provider: work Persons participating in the virtual visit: patient, daughter Geni Bers,  Marine scientist.    I discussed the limitations, risks, security and privacy concerns of performing an evaluation and management service by telephone and the availability of in person appointments. I also discussed with the patient that there may be a patient responsible charge related to this service. The patient expressed understanding and verbally consented to this telephonic visit.    Interactive audio and video telecommunications were attempted between this provider and patient, however failed, due to patient having technical difficulties OR patient did not have access to video capability.  We continued and completed visit with audio only.  Some vital signs may be absent or patient reported.   Time Spent with patient on telephone encounter: 25 minutes  Review of Systems     Cardiac Risk Factors include: advanced age (>85men, >14 women);dyslipidemia;hypertension     Objective:    Today's Vitals   08/20/21 1123  Weight: 160 lb (72.6 kg)  Height: 5\' 1"  (1.549 m)   Body mass index is 30.23 kg/m.  Advanced Directives 08/20/2021 02/16/2021 01/12/2021 02/07/2020 12/12/2019 09/04/2019 02/13/2019  Does Patient Have a Medical Advance Directive? Yes Yes No Yes Yes Yes Yes  Type of Paramedic of [place];Living will [place];Living will;Out of facility DNR (pink MOST or yellow form) - [place];Living will [place];Living will - Living will;Healthcare Power of Attorney  Does patient want to make changes to medical advance directive? Yes  (MAU/Ambulatory/Procedural Areas - Information given) - - - - - -  Copy of Healthcare Power of Attorney in Chart? - - - - No - copy requested - -  Would patient like information on creating a medical advance directive? - - No - Patient declined - - - -  Pre-existing out of facility DNR order (yellow form or pink MOST form) - - - - - - -    Current Medications (verified) Outpatient Encounter Medications as of 08/20/2021  Medication Sig   aspirin 81 MG tablet Take 81 mg by mouth daily.   Bioflavonoid Products (GRAPE SEED PO) Take 1 tablet by mouth daily.    calcium carbonate (TUMS EX) 750 MG chewable tablet Chew 1 tablet by mouth as needed.    Calcium Carbonate-Vitamin D (CALCIUM + D PO) Take 600 mg by mouth daily.   carbidopa-levodopa (SINEMET IR) 25-100 MG tablet Take 3 tablets by mouth 3 (three) times daily.   cetirizine (ZYRTEC) 10 MG tablet Take 10 mg by mouth daily.   Cholecalciferol (VITAMIN D3) 1000 UNITS CAPS Take 1 capsule by mouth daily.   clonazePAM (KLONOPIN) 0.5 MG tablet TAKE 1/2 TABLET BY MOUTH AT BEDTIME AND 1/2 DAILY AS NEEDED   escitalopram (LEXAPRO) 10 MG tablet TAKE 1 TABLET BY MOUTH EVERY DAY   feeding supplement (ENSURE COMPLETE) LIQD Take 237 mLs by mouth 2 (two) times daily between meals.   fluticasone (FLONASE) 50 MCG/ACT nasal spray Place 2 sprays into both nostrils daily.   gabapentin (NEURONTIN) 100 MG capsule Take 1 capsule (100 mg total) by mouth 3 (three) times daily.   losartan-hydrochlorothiazide (HYZAAR) 50-12.5 MG tablet TAKE 1 TABLET BY MOUTH EVERY DAY FOR BLOOD PRESSURE  mirtazapine (REMERON) 30 MG tablet TAKE 1 TABLET (30 MG TOTAL) BY MOUTH AT BEDTIME. FOR SLEEP.   montelukast (SINGULAIR) 10 MG tablet TAKE 1 TABLET (10 MG TOTAL) BY MOUTH AT BEDTIME. FOR ALLERGIES   Multiple Vitamins-Minerals (CENTRUM SILVER ADULT 50+) TABS Take by mouth.   naproxen sodium (ALEVE) 220 MG tablet Take 220 mg by mouth in the morning and at bedtime.   rosuvastatin (CRESTOR) 5  MG tablet TAKE 1 TABLET (5 MG TOTAL) BY MOUTH EVERY EVENING. FOR CHOLESTEROL.   sulfamethoxazole-trimethoprim (BACTRIM DS) 800-160 MG tablet Take 1 tablet by mouth 2 (two) times daily. For urinary tract infection.   vitamin B-12 (CYANOCOBALAMIN) 1000 MCG tablet Take 1,000 mcg by mouth daily.   [DISCONTINUED] mirtazapine (REMERON) 30 MG tablet TAKE 1 TABLET (30 MG TOTAL) BY MOUTH AT BEDTIME. FOR SLEEP.   [DISCONTINUED] simvastatin (ZOCOR) 20 MG tablet TAKE 1 TABLET BY MOUTH EVERY DAY AT BEDTIME FOR CHOLESTEROL   No facility-administered encounter medications on file as of 08/20/2021.    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    "can't do either"   DVT (deep venous thrombosis), left "early 2000's"   RLE   Dysmetabolic syndrome X    Essential hypertension, benign    Headache(784.0) 05/2011   "pretty often since they put me on Parkinson's medicine"   History of shingles    "as a teen and in my 60's"   Mixed hyperlipidemia    Morbid obesity ([place])    Neurodegenerative gait disorder    Personal history of thrombophlebitis    Syncope and collapse 12/06/11   "this am; I pass out fairly often"   Type II or unspecified type diabetes mellitus without mention of complication, not stated as uncontrolled 12/06/11   "not anymore; had lap band OR"   Past Surgical History:  Procedure Laterality Date   bladder tack  11/1995   BREAST BIOPSY  05/20/2000   bilaterally   BREAST CYST ASPIRATION  12/1998   bilaterally   CARDIAC CATHETERIZATION  2004   CATARACT EXTRACTION W/ INTRAOCULAR LENS  IMPLANT, BILATERAL     FRACTURE SURGERY  02/09/2006   bilateral elbows   FRACTURE SURGERY     right knee   FRACTURE SURGERY     tib  plateau   KNEE ARTHROSCOPY  12/12/2002   right    LAPAROSCOPIC GASTRIC BANDING  2008   SEPTOPLASTY  08/1998   with antral window    TONSILLECTOMY AND ADENOIDECTOMY  1970   TUBAL LIGATION  1970's   Family History  Problem Relation Age of Onset   Asthma Mother    COPD Father    Heart disease Father    Diabetes Sister    Vision loss Sister        legally blind   Hypertension Child    Hyperlipidemia Child    Diabetes Child    Diabetes Son    Breast cancer Neg Hx    Social History   Socioeconomic History   Marital status: Widowed    Spouse name: Not on file   Number of children: 2   Years of education: Not on file   Highest education level: Some college, no degree  Occupational  History   Occupation: Secondary school teacher: [employer]    Comment: retired, 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   Daughter is HPOA.   DNR.   Social Determinants of Health   Financial Resource Strain: Low Risk    Difficulty of Paying Living Expenses: Not hard at all  Food Insecurity: No Food Insecurity   Worried About Charity fundraiser in the Last Year: Never true   [place] in the Last Year: Never true  Transportation Needs: No Transportation Needs   Lack of Transportation (Medical): No   Lack of Transportation (Non-Medical): No  Physical Activity: Insufficiently Active   Days of Exercise per Week: 3 days   Minutes of Exercise per Session: 20 min  Stress: No Stress Concern Present   Feeling of Stress : Not at all  Social Connections: Socially Isolated   Frequency of Communication with Friends and Family: Never   Frequency of Social Gatherings  with Friends and Family: More than three times a week   Attends Religious Services: Never   Marine scientist or Organizations: No   Attends Archivist Meetings: Never   Marital Status: Widowed    Tobacco Counseling Counseling given: Not Answered   Clinical Intake:  Pre-visit preparation completed: Yes  Pain : No/denies pain     BMI - recorded: 30.23 Nutritional Status: BMI > 30  Obese Nutritional Risks: None Diabetes: Yes (controlled with diet) CBG done?: No Did pt. bring in CBG monitor from home?: No  How often do you need to have someone help you when you read instructions, pamphlets, or other written materials from your doctor or pharmacy?: 1 - Never  Diabetes:  Is the patient diabetic?  Yes  If diabetic, was a CBG obtained today?  No  Did the patient bring in their glucometer from home?  No  How often do you monitor your CBG's? Patient diabetes controlled with diet.   Financial Strains and Diabetes Management:  Are you having any financial strains with the device, your supplies or your medication? No .  Does the patient want to be seen by Chronic Care Management for management of their diabetes?  No  Would the patient like to be referred to a Nutritionist or for Diabetic Management?  No   Diabetic Exams:  Diabetic Eye Exam: Completed 02/2021.   Diabetic Foot Exam: Completed 06/12/21.    Interpreter Needed?: No  Information entered by :: Orrin Brigham LPN   Activities of Daily Living In your present state of health, do you have any difficulty performing the following activities: 08/20/2021 01/13/2021  Hearing? N N  Comment - -  Vision? N N  Comment - -  Difficulty concentrating or making decisions? N N  Walking or climbing stairs? Y Y  Dressing or bathing? N Y  Doing errands, shopping? Aggie Moats  Comment daughter assist -  Conservation officer, nature and eating ? N -  Using the Toilet? N -  In the past six months, have you accidently leaked urine? Y -  Comment wears pads -  Do you have problems with loss of bowel control? N -  Managing your Medications? N -  Managing your Finances? N -  Housekeeping or managing your Housekeeping? N -  Some recent data might be hidden    Patient Care Team: Pleas Koch, NP as PCP - General (Internal Medicine) Tat, Eustace Quail, DO as Consulting Physician (Neurology)  Indicate any recent Medical Services you may have received from other than [facility] providers in the past year (date may be approximate).     Assessment:   This is a routine wellness examination for [place].  Hearing/Vision screen Hearing Screening - Comments:: Decrease in hearing Vision Screening - Comments:: Last exam 02/2021, [facility] Dr. Corky Sing  Dietary issues and exercise activities discussed: Current Exercise Habits: Home exercise routine, Type of exercise: walking, Time (Minutes): 25, Frequency (Times/Week): 3, Weekly Exercise (Minutes/Week): 75, Intensity: Mild   Goals Addressed             This Visit's Progress    Patient Stated       Would like to increase fluid intake       Depression Screen PHQ 2/9 Scores 08/20/2021 06/12/2021 11/05/2020 12/12/2019 12/14/2018 12/12/2017 12/07/2017  PHQ - 2 Score 0 0 0 6 3 1  0  PHQ- 9 Score - - 1 6 6 12  -    Fall Risk Fall Risk  08/20/2021 02/16/2021 11/05/2020 02/07/2020 12/12/2019  Falls in the past year? 0 0 1 1 1   Comment - - - - -  Number falls in past yr: 0 0 0 1 1  Injury with Fall? 0 0 0 0 0  Risk Factor Category  - - - - -  Risk for fall due to : - - Impaired balance/gait - Medication side effect  Follow up Falls prevention discussed - - - Falls evaluation completed;Falls prevention discussed    FALL RISK PREVENTION PERTAINING TO THE HOME:  Any stairs in or around the home? Yes  If so, are there any without handrails? No  Home free of loose throw rugs in walkways, pet beds, electrical cords, etc? Yes  Adequate lighting in your home to reduce risk of falls?  Yes   ASSISTIVE DEVICES UTILIZED TO PREVENT FALLS:  Life alert? No  Use of a cane, walker or w/c? Yes , cane Grab bars in the bathroom? No  Shower chair or bench in shower? Yes  Elevated toilet seat or a handicapped toilet? No   TIMED UP AND GO:  Was the test performed? No .    Cognitive Function: Normal cognitive status assessed by  this Nurse Health Advisor. No abnormalities found.   MMSE - Mini Mental State Exam 02/18/2020 12/12/2019 12/14/2018 12/07/2017 09/09/2017  Not completed: - Refused - - Refused  Orientation to time 4 - 5 4 -  Orientation to Place 5 - 5 5 -  Registration 3 - 3 3 -  Attention/ Calculation 5 - 0 5 -  Recall 0 - 2 2 -  Recall-comments - - unable to recall 1 of 3 words  - -  Language- name 2 objects 2 - 0 2 -  Language- repeat 1 - 1 1 -  Language- follow 3 step command 3 - 0 3 -  Language- read & follow direction 1 - 0 1 -  Write a sentence 1 - 0 1 -  Copy design 0 - 0 1 -  Total score 25 - 16 28 -   Montreal Cognitive  Assessment  05/07/2014  Visuospatial/ Executive (0/5) 2  Naming (0/3) 2  Attention: Read list of digits (0/2) 1  Attention: Read list of letters (0/1) 1  Attention: Serial 7 subtraction starting at 100 (0/3) 3  Language: Repeat phrase (0/2) 2  Language : Fluency (0/1) 1  Abstraction (0/2) 2  Delayed Recall (0/5) 5  Orientation (0/6) 6  Total 25  Adjusted Score (based on education) 26      Immunizations Immunization History  Administered Date(s) Administered   Fluad Quad(high Dose 65+) 03/30/2019, 06/04/2020   H1N1 08/29/2008   Influenza Split 04/07/2012   Influenza Whole 04/16/2010   Influenza, High Dose Seasonal PF 05/12/2017, 04/11/2021   Influenza,inj,Quad PF,6+ Mos 04/03/2013, 06/13/2014, 05/27/2015, 04/22/2016, 04/03/2018   PFIZER(Purple Top)SARS-COV-2 Vaccination 10/17/2019, 11/07/2019, 06/13/2020   Pneumococcal Conjugate-13 11/26/2013   Pneumococcal Polysaccharide-23 04/16/2010   Td 08/29/2008   Tdap 06/21/2012    Zoster Recombinat (Shingrix) 03/15/2020, 05/15/2020   Zoster, Live 04/18/2008    TDAP status: Up to date  Flu Vaccine status: Up to date  Pneumococcal vaccine status: Up to date  Covid-19 vaccine status: Completed vaccines  Qualifies for Shingles Vaccine? Yes   Zostavax completed Yes   Shingrix Completed?: Yes  Screening Tests Health Maintenance  Topic Date Due   COVID-19 Vaccine (4 - Booster for Pfizer series) 08/08/2020   MAMMOGRAM  11/05/2021 (Originally 08/14/2020)   OPHTHALMOLOGY EXAM  11/05/2021 (Originally 04/02/2020)   HEMOGLOBIN A1C  12/10/2021   FOOT EXAM  06/12/2022   TETANUS/TDAP  06/21/2022   Pneumonia Vaccine 38+ Years old  Completed   INFLUENZA VACCINE  Completed   DEXA SCAN  Completed   Hepatitis C Screening  Completed   Zoster Vaccines- Shingrix  Completed   HPV VACCINES  Aged Out   Fecal DNA (Cologuard)  Discontinued    Health Maintenance  Health Maintenance Due  Topic Date Due   COVID-19 Vaccine (4 - Booster for [place] series) 08/08/2020    Colorectal cancer screening: No longer required.   Mammogram status: Ordered 08/20/21. Pt provided with contact info and advised to call to schedule appt.   Bone Density status: Ordered 08/20/21. Pt provided with contact info and advised to call to schedule appt.  Lung Cancer Screening: (Low Dose CT Chest recommended if Age 68-80 years, 30 pack-year currently smoking OR have quit w/in 15years.) does not qualify.     Additional Screening:  Hepatitis C Screening: does qualify; Completed 12/01/15  Vision Screening: Recommended annual ophthalmology exams for early detection of glaucoma and other disorders of the eye. Is the patient up to date with their annual eye exam?  Yes  Who is the provider or what is the name of the office in which the patient attends annual eye exams? Dr. Corky Sing  Dental Screening: Recommended annual dental exams for proper oral hygiene  Community Resource Referral / Chronic Care  Management: CRR required this visit?  No   CCM required this visit?  No      Plan:     I have personally reviewed and noted the following in the patients chart:   Medical and social history Use of alcohol, tobacco or illicit drugs  Current medications and supplements including opioid prescriptions.  Functional ability and status Nutritional status Physical activity Advanced directives List of other physicians Hospitalizations, surgeries, and ER visits in previous 12 months Vitals Screenings to include cognitive, depression, and falls Referrals and appointments  In addition, I have reviewed and discussed with patient certain preventive protocols, quality metrics,  and best practice recommendations. A written personalized care plan for preventive services as well as general preventive health recommendations were provided to patient.   Due to this being a telephonic visit, the after visit summary with patients personalized plan was offered to patient via mail or my-chart.  Patient would like to access on my-chart.   Loma Messing, LPN   2/[id number]/[id number]   Nurse Health Advisor  Nurse Notes: none

## 2021-08-19 NOTE — Progress Notes (Signed)
Virtual Visit Via Video   The purpose of this virtual visit is to provide medical care while limiting exposure to the novel coronavirus.    Consent was obtained for video visit:  Yes.   Answered questions that patient had about telehealth interaction:  Yes.   I discussed the limitations, risks, security and privacy concerns of performing an evaluation and management service by telemedicine. I also discussed with the patient that there may be a patient responsible charge related to this service. The patient expressed understanding and agreed to proceed.  Pt location: Home Physician Location: office Name of referring provider:  Pleas Koch, NP I connected with Wilburt Finlay at patients initiation/request on 08/21/2021 at  8:15 AM EST by video enabled telemedicine application and verified that I am speaking with the correct person using two identifiers. Pt MRN:  [id number] Pt DOB:  [birth year] Video Participants:  Wilburt Finlay;  daughter supplements hx  Assessment/Plan:   1.  Parkinsons Disease  -Continue carbidopa/levodopa 25/100, 3 tablets 3 times per day.  Refill today.  2.  Depression  -On Lexapro  -On mirtazapine  -Managed by PCP  3.  RBD and GAD  -On clonazepam 0.5 mg, half tablet at bedtime and will take half tablet as needed in the day for anxiety.  -PDMP reviewed.  No red flags.  -Not acting out the dreams at night, but some yelling.  Daughter asks about that.  I told her that they could add some low-dose melatonin for that, but otherwise I would not add further medication.  4.  Chronic headache  -On gabapentin, 100 mg 3 times per day.  Refilled today.  -Saw Dr. Tommi Rumps in the remote past.  5.  [facility]  -Patient last had neurocognitive testing in August, 2021 with Dr. Nicole Kindred.   Subjective:   [facility] was seen today in follow up for Parkinsons disease.  My previous records were reviewed prior to todays visit as well as outside records available to  me.   Pt denies falls.  Pt denies lightheadedness, near syncope.  Nurse practitioner noting that diabetes has been worsening.  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  PDMP reviewed.  Last filled November 22.  No red flags. Sleeping well with medication.  No hallucinations.  Daughter noting screaming a bit more at night with vivid dreams but not acting out dreams.  Current prescribed movement disorder medications:  Carbidopa/levodopa 25/100, 3 tablets 3 times per day Mirtazapine, 30 mg at night Gabapentin, 100 mg 3 times per day Clonazepam, 0.25 mg twice daily    ALLERGIES:   Allergies  Allergen Reactions   Codeine Sulfate     REACTION: hallucinations   Penicillins Anaphylaxis, Swelling and Rash   Wellbutrin [Bupropion] Other (See Comments)    Seizures and hallucinations    CURRENT MEDICATIONS:  Outpatient Encounter Medications as of 08/21/2021  Medication Sig   aspirin 81 MG tablet Take 81 mg by mouth daily.   Bioflavonoid Products (GRAPE SEED PO) Take 1 tablet by mouth daily.    calcium carbonate (TUMS EX) 750 MG chewable tablet Chew 1 tablet by mouth as needed.    Calcium Carbonate-Vitamin D (CALCIUM + D PO) Take 600 mg by mouth daily.   carbidopa-levodopa (SINEMET IR) 25-100 MG tablet Take 3 tablets by mouth 3 (three) times daily.   cetirizine (ZYRTEC) 10 MG tablet Take 10 mg by mouth daily.   Cholecalciferol (VITAMIN D3) 1000 UNITS CAPS Take 1 capsule by mouth daily.  clonazePAM (KLONOPIN) 0.5 MG tablet TAKE 1/2 TABLET BY MOUTH AT BEDTIME AND 1/2 DAILY AS NEEDED   escitalopram (LEXAPRO) 10 MG tablet TAKE 1 TABLET BY MOUTH EVERY DAY   feeding supplement (ENSURE COMPLETE) LIQD Take 237 mLs by mouth 2 (two) times daily between meals.   fluticasone (FLONASE) 50 MCG/ACT nasal spray Place 2 sprays into both nostrils daily.   gabapentin (NEURONTIN) 100 MG capsule Take 1 capsule (100 mg total) by mouth 3 (three) times daily.   losartan-hydrochlorothiazide (HYZAAR) 50-12.5 MG  tablet TAKE 1 TABLET BY MOUTH EVERY DAY FOR BLOOD PRESSURE   mirtazapine (REMERON) 30 MG tablet TAKE 1 TABLET (30 MG TOTAL) BY MOUTH AT BEDTIME. FOR SLEEP.   montelukast (SINGULAIR) 10 MG tablet TAKE 1 TABLET (10 MG TOTAL) BY MOUTH AT BEDTIME. FOR ALLERGIES   Multiple Vitamins-Minerals (CENTRUM SILVER ADULT 50+) TABS Take by mouth.   naproxen sodium (ALEVE) 220 MG tablet Take 220 mg by mouth in the morning and at bedtime.   rosuvastatin (CRESTOR) 5 MG tablet TAKE 1 TABLET (5 MG TOTAL) BY MOUTH EVERY EVENING. FOR CHOLESTEROL.   vitamin B-12 (CYANOCOBALAMIN) 1000 MCG tablet Take 1,000 mcg by mouth daily.   [DISCONTINUED] mirtazapine (REMERON) 30 MG tablet TAKE 1 TABLET (30 MG TOTAL) BY MOUTH AT BEDTIME. FOR SLEEP.   [DISCONTINUED] simvastatin (ZOCOR) 20 MG tablet TAKE 1 TABLET BY MOUTH EVERY DAY AT BEDTIME FOR CHOLESTEROL   [DISCONTINUED] sulfamethoxazole-trimethoprim (BACTRIM DS) 800-160 MG tablet Take 1 tablet by mouth 2 (two) times daily. For urinary tract infection. (Patient not taking: Reported on 08/21/2021)   No facility-administered encounter medications on file as of 08/21/2021.    Objective:   PHYSICAL EXAMINATION:    VITALS:   There were no vitals filed for this visit.   GEN:  The patient appears stated age and is in NAD. HEENT:  Normocephalic, atraumatic.  The mucous membranes are moist. The superficial temporal arteries are without ropiness or tenderness.   Neurological examination:  Orientation: The patient is alert and oriented x3. Cranial nerves: There is chronic facial droop with minimal facial hypomimia. The speech is fluent and clear.  Motor: Strength is at least antigravity x4.  Movement examination: Abnormal movements: none Coordination:  There is minimal decremation with RAM's, with finger taps bilaterally Gait and Station: Patient ambulates well within her home.  I have reviewed and interpreted the following labs independently    Chemistry      Component  Value Date/Time   NA 140 06/02/2021 1505   K 4.4 06/02/2021 1505   CL 99 06/02/2021 1505   CO2 31 06/02/2021 1505   BUN 25 (H) 06/02/2021 1505   CREATININE 0.66 06/02/2021 1505   CREATININE 0.71 12/14/2019 1504      Component Value Date/Time   CALCIUM 9.9 06/02/2021 1505   ALKPHOS 60 01/12/2021 1340   AST 18 01/12/2021 1340   ALT 5 01/12/2021 1340   BILITOT 0.3 01/12/2021 1340       Lab Results  Component Value Date   WBC 8.3 01/13/2021   HGB 11.2 (L) 01/13/2021   HCT 35.7 (L) 01/13/2021   MCV 98.1 01/13/2021   PLT 185 01/13/2021    Lab Results  Component Value Date   TSH 1.04 12/07/2017     Follow up Instructions      -I discussed the assessment and treatment plan with the patient. The patient was provided an opportunity to ask questions and all were answered. The patient agreed with the plan and demonstrated  an understanding of the instructions.   The patient was advised to call back or seek an in-person evaluation if the symptoms worsen or if the condition fails to improve as anticipated.    Alonza Bogus, DO   Cc:  Pleas Koch, NP

## 2021-08-20 ENCOUNTER — Ambulatory Visit (INDEPENDENT_AMBULATORY_CARE_PROVIDER_SITE_OTHER): Payer: Medicare Other

## 2021-08-20 VITALS — Ht 61.0 in | Wt 160.0 lb

## 2021-08-20 DIAGNOSIS — Z1231 Encounter for screening mammogram for malignant neoplasm of breast: Secondary | ICD-10-CM

## 2021-08-20 DIAGNOSIS — Z0000 Encounter for general adult medical examination without abnormal findings: Secondary | ICD-10-CM

## 2021-08-20 DIAGNOSIS — Z780 Asymptomatic menopausal state: Secondary | ICD-10-CM

## 2021-08-20 NOTE — Patient Instructions (Signed)
[name] , Thank you for taking time to complete your Medicare Wellness Visit. I appreciate your ongoing commitment to your health goals. Please review the following plan we discussed and let me know if I can assist you in the future.   Screening recommendations/referrals: Colonoscopy: no longer required  Mammogram: due, last completed 08/15/19, ordered today someone will call to schedule an appointment Bone Density: due, last completed 08/15/19, ordered today someone will call to schedule an appointment Recommended yearly ophthalmology/optometry visit for glaucoma screening and checkup Recommended yearly dental visit for hygiene and checkup  Vaccinations: Influenza vaccine: up to date Pneumococcal vaccine: up to date Tdap vaccine: up to date, completed 06/21/12, due 06/21/22 Shingles vaccine: up to date    Covid-19:up to date  Advanced directives: Please bring a copy of Living Will and/or [place] for your chart.   Conditions/risks identified: see problem list   Next appointment: Follow up in one year for your annual wellness visit 08/23/22 @ 11:15am, this will be a telephone visit   Preventive Care 65 Years and Older, Female Preventive care refers to lifestyle choices and visits with your health care provider that can promote health and wellness. What does preventive care include? A yearly physical exam. This is also called an annual well check. Dental exams once or twice a year. Routine eye exams. Ask your health care provider how often you should have your eyes checked. Personal lifestyle choices, including: Daily care of your teeth and gums. Regular physical activity. Eating a healthy diet. Avoiding tobacco and drug use. Limiting alcohol use. Practicing safe sex. Taking low-dose aspirin every day. Taking vitamin and mineral supplements as recommended by your health care provider. What happens during an annual well check? The services and screenings  done by your health care provider during your annual well check will depend on your age, overall health, lifestyle risk factors, and family history of disease. Counseling  Your health care provider may ask you questions about your: Alcohol use. Tobacco use. Drug use. Emotional well-being. Home and relationship well-being. Sexual activity. Eating habits. History of falls. Memory and ability to understand (cognition). Work and work Statistician. Reproductive health. Screening  You may have the following tests or measurements: Height, weight, and BMI. Blood pressure. Lipid and cholesterol levels. These may be checked every 5 years, or more frequently if you are over 22 years old. Skin check. Lung cancer screening. You may have this screening every year starting at age 62 if you have a 30-pack-year history of smoking and currently smoke or have quit within the past 15 years. Fecal occult blood test (FOBT) of the stool. You may have this test every year starting at age 68. Flexible sigmoidoscopy or colonoscopy. You may have a sigmoidoscopy every 5 years or a colonoscopy every 10 years starting at age 49. Hepatitis C blood test. Hepatitis B blood test. Sexually transmitted disease (STD) testing. Diabetes screening. This is done by checking your blood sugar (glucose) after you have not eaten for a while (fasting). You may have this done every 1-3 years. Bone density scan. This is done to screen for osteoporosis. You may have this done starting at age 58. Mammogram. This may be done every 1-2 years. Talk to your health care provider about how often you should have regular mammograms. Talk with your health care provider about your test results, treatment options, and if necessary, the need for more tests. Vaccines  Your health care provider may recommend certain vaccines, such as: Influenza  vaccine. This is recommended every year. Tetanus, diphtheria, and acellular pertussis (Tdap, Td)  vaccine. You may need a Td booster every 10 years. Zoster vaccine. You may need this after age 14. Pneumococcal 13-valent conjugate (PCV13) vaccine. One dose is recommended after age 75. Pneumococcal polysaccharide (PPSV23) vaccine. One dose is recommended after age 45. Talk to your health care provider about which screenings and vaccines you need and how often you need them. This information is not intended to replace advice given to you by your health care provider. Make sure you discuss any questions you have with your health care provider. Document Released: 08/08/2015 Document Revised: 03/31/2016 Document Reviewed: 05/13/2015 Elsevier Interactive Patient Education  2017 [place] Prevention in the Home Falls can cause injuries. They can happen to people of all ages. There are many things you can do to make your home safe and to help prevent falls. What can I do on the outside of my home? Regularly fix the edges of walkways and driveways and fix any cracks. Remove anything that might make you trip as you walk through a door, such as a raised step or threshold. Trim any bushes or trees on the path to your home. Use bright outdoor lighting. Clear any walking paths of anything that might make someone trip, such as rocks or tools. Regularly check to see if handrails are loose or broken. Make sure that both sides of any steps have handrails. Any raised decks and porches should have guardrails on the edges. Have any leaves, snow, or ice cleared regularly. Use sand or salt on walking paths during winter. Clean up any spills in your garage right away. This includes oil or grease spills. What can I do in the bathroom? Use night lights. Install grab bars by the toilet and in the tub and shower. Do not use towel bars as grab bars. Use non-skid mats or decals in the tub or shower. If you need to sit down in the shower, use a plastic, non-slip stool. Keep the floor dry. Clean up any  water that spills on the floor as soon as it happens. Remove soap buildup in the tub or shower regularly. Attach bath mats securely with double-sided non-slip rug tape. Do not have throw rugs and other things on the floor that can make you trip. What can I do in the bedroom? Use night lights. Make sure that you have a light by your bed that is easy to reach. Do not use any sheets or blankets that are too big for your bed. They should not hang down onto the floor. Have a firm chair that has side arms. You can use this for support while you get dressed. Do not have throw rugs and other things on the floor that can make you trip. What can I do in the kitchen? Clean up any spills right away. Avoid walking on wet floors. Keep items that you use a lot in easy-to-reach places. If you need to reach something above you, use a strong step stool that has a grab bar. Keep electrical cords out of the way. Do not use floor polish or wax that makes floors slippery. If you must use wax, use non-skid floor wax. Do not have throw rugs and other things on the floor that can make you trip. What can I do with my stairs? Do not leave any items on the stairs. Make sure that there are handrails on both sides of the stairs and use them. Fix  handrails that are broken or loose. Make sure that handrails are as long as the stairways. Check any carpeting to make sure that it is firmly attached to the stairs. Fix any carpet that is loose or worn. Avoid having throw rugs at the top or bottom of the stairs. If you do have throw rugs, attach them to the floor with carpet tape. Make sure that you have a light switch at the top of the stairs and the bottom of the stairs. If you do not have them, ask someone to add them for you. What else can I do to help prevent falls? Wear shoes that: Do not have high heels. Have rubber bottoms. Are comfortable and fit you well. Are closed at the toe. Do not wear sandals. If you use a  stepladder: Make sure that it is fully opened. Do not climb a closed stepladder. Make sure that both sides of the stepladder are locked into place. Ask someone to hold it for you, if possible. Clearly mark and make sure that you can see: Any grab bars or handrails. First and last steps. Where the edge of each step is. Use tools that help you move around (mobility aids) if they are needed. These include: Canes. Walkers. Scooters. Crutches. Turn on the lights when you go into a dark area. Replace any light bulbs as soon as they burn out. Set up your furniture so you have a clear path. Avoid moving your furniture around. If any of your floors are uneven, fix them. If there are any pets around you, be aware of where they are. Review your medicines with your doctor. Some medicines can make you feel dizzy. This can increase your chance of falling. Ask your doctor what other things that you can do to help prevent falls. This information is not intended to replace advice given to you by your health care provider. Make sure you discuss any questions you have with your health care provider. Document Released: 05/08/2009 Document Revised: 12/18/2015 Document Reviewed: 08/16/2014 Elsevier Interactive Patient Education  2017 [employer].

## 2021-08-21 ENCOUNTER — Encounter: Payer: Self-pay | Admitting: Neurology

## 2021-08-21 ENCOUNTER — Other Ambulatory Visit: Payer: Self-pay

## 2021-08-21 ENCOUNTER — Telehealth (INDEPENDENT_AMBULATORY_CARE_PROVIDER_SITE_OTHER): Payer: Medicare Other | Admitting: Neurology

## 2021-08-21 DIAGNOSIS — G20 Parkinson's disease: Secondary | ICD-10-CM

## 2021-08-21 DIAGNOSIS — G4752 REM sleep behavior disorder: Secondary | ICD-10-CM | POA: Diagnosis not present

## 2021-08-22 ENCOUNTER — Other Ambulatory Visit: Payer: Self-pay | Admitting: Neurology

## 2021-08-22 DIAGNOSIS — G20 Parkinson's disease: Secondary | ICD-10-CM

## 2021-08-22 DIAGNOSIS — F418 Other specified anxiety disorders: Secondary | ICD-10-CM

## 2021-08-24 ENCOUNTER — Other Ambulatory Visit: Payer: Self-pay

## 2021-09-13 ENCOUNTER — Other Ambulatory Visit: Payer: Self-pay | Admitting: Neurology

## 2021-09-13 ENCOUNTER — Other Ambulatory Visit: Payer: Self-pay | Admitting: Primary Care

## 2021-09-13 DIAGNOSIS — J309 Allergic rhinitis, unspecified: Secondary | ICD-10-CM

## 2021-09-13 DIAGNOSIS — I10 Essential (primary) hypertension: Secondary | ICD-10-CM

## 2021-09-16 ENCOUNTER — Other Ambulatory Visit: Payer: Self-pay | Admitting: Neurology

## 2021-09-16 DIAGNOSIS — F418 Other specified anxiety disorders: Secondary | ICD-10-CM

## 2021-09-17 ENCOUNTER — Other Ambulatory Visit: Payer: Self-pay

## 2021-09-21 ENCOUNTER — Other Ambulatory Visit: Payer: Self-pay

## 2021-09-21 ENCOUNTER — Telehealth: Payer: Self-pay | Admitting: Neurology

## 2021-09-21 DIAGNOSIS — F418 Other specified anxiety disorders: Secondary | ICD-10-CM

## 2021-09-21 MED ORDER — CLONAZEPAM 0.5 MG PO TABS: ORAL_TABLET | ORAL | 0 refills | Status: DC

## 2021-09-21 NOTE — Telephone Encounter (Signed)
CALLED [facility] AND DID A VERBAL ORDER CALLED DAUGHTER AND PATIENT

## 2021-09-21 NOTE — Telephone Encounter (Signed)
Patients daughter is calling about her mothers clonazepam. She needs a refill , she is completely out.

## 2021-10-01 ENCOUNTER — Other Ambulatory Visit: Payer: Self-pay | Admitting: Primary Care

## 2021-10-01 DIAGNOSIS — F341 Dysthymic disorder: Secondary | ICD-10-CM

## 2021-10-01 DIAGNOSIS — E782 Mixed hyperlipidemia: Secondary | ICD-10-CM

## 2021-11-24 ENCOUNTER — Telehealth: Payer: Self-pay

## 2021-11-24 NOTE — Telephone Encounter (Signed)
Benefits submitted ?Next injection due 12/11/21 ?Patient is overdue for Candace Gallus scan also ?

## 2021-12-14 LAB — HM DIABETES EYE EXAM

## 2021-12-15 ENCOUNTER — Ambulatory Visit (INDEPENDENT_AMBULATORY_CARE_PROVIDER_SITE_OTHER): Payer: Medicare Other | Admitting: Primary Care

## 2021-12-15 VITALS — BP 124/62 | HR 72 | Temp 97.7°F | Ht 61.0 in | Wt 166.0 lb

## 2021-12-15 DIAGNOSIS — E119 Type 2 diabetes mellitus without complications: Secondary | ICD-10-CM

## 2021-12-15 DIAGNOSIS — G214 Vascular parkinsonism: Secondary | ICD-10-CM

## 2021-12-15 DIAGNOSIS — Z0000 Encounter for general adult medical examination without abnormal findings: Secondary | ICD-10-CM | POA: Diagnosis not present

## 2021-12-15 DIAGNOSIS — E782 Mixed hyperlipidemia: Secondary | ICD-10-CM

## 2021-12-15 DIAGNOSIS — M810 Age-related osteoporosis without current pathological fracture: Secondary | ICD-10-CM

## 2021-12-15 DIAGNOSIS — F341 Dysthymic disorder: Secondary | ICD-10-CM

## 2021-12-15 DIAGNOSIS — I10 Essential (primary) hypertension: Secondary | ICD-10-CM

## 2021-12-15 LAB — COMPREHENSIVE METABOLIC PANEL
ALT: 2 U/L (ref 0–35)
AST: 11 U/L (ref 0–37)
Albumin: 3.9 g/dL (ref 3.5–5.2)
Alkaline Phosphatase: 63 U/L (ref 39–117)
BUN: 19 mg/dL (ref 6–23)
CO2: 34 mEq/L — ABNORMAL HIGH (ref 19–32)
Calcium: 10.2 mg/dL (ref 8.4–10.5)
Chloride: 100 mEq/L (ref 96–112)
Creatinine, Ser: 0.65 mg/dL (ref 0.40–1.20)
GFR: 85.19 mL/min (ref >60.00)
Glucose, Bld: 155 mg/dL — ABNORMAL HIGH (ref 70–99)
Potassium: 4.1 mEq/L (ref 3.5–5.1)
Sodium: 142 mEq/L (ref 135–145)
Total Bilirubin: 0.5 mg/dL (ref 0.2–1.2)
Total Protein: 6.6 g/dL (ref 6.0–8.3)

## 2021-12-15 LAB — CBC
HCT: 40.2 % (ref 36.0–46.0)
Hemoglobin: 13.4 g/dL (ref 12.0–15.0)
MCHC: 33.3 g/dL (ref 30.0–36.0)
MCV: 94.5 fl (ref 78.0–100.0)
Platelets: 210 10*3/uL (ref 150.0–400.0)
RBC: 4.25 Mil/uL (ref 3.87–5.11)
RDW: 13.8 % (ref 11.5–15.5)
WBC: 7.9 10*3/uL (ref 4.0–10.5)

## 2021-12-15 LAB — LIPID PANEL
Cholesterol: 114 mg/dL (ref 0–200)
HDL: 45.4 mg/dL (ref >39.00)
LDL Cholesterol: 50 mg/dL (ref 0–99)
NonHDL: 68.9
Total CHOL/HDL Ratio: 3
Triglycerides: 96 mg/dL (ref 0.0–149.0)
VLDL: 19.2 mg/dL (ref 0.0–40.0)

## 2021-12-15 LAB — HEMOGLOBIN A1C: Hgb A1c MFr Bld: 7.6 % — ABNORMAL HIGH (ref 4.6–6.5)

## 2021-12-15 NOTE — Progress Notes (Signed)
Subjective:    Patient ID: [name], female    DOB: July 17, 1946, 76 y.o.   MRN: [id number]  HPI  ALLYSA GOVERNALE is a very pleasant 76 y.o. female who presents today for complete physical and follow up of chronic conditions.  Immunizations: -Tetanus: 2013 -Influenza: Completed last season -Covid-19: 3 vaccines  -Shingles: Completed Zostavax and Shingrix -Pneumonia: Prevnar 13 in 2015, Pneumovax 23 in 2011   Diet: [place].  Exercise: No regular exercise. Some walking at times.   Eye exam: Completes annually  Dental exam: Completes semi-annually   Mammogram: Completed in 2021 Colonoscopy: Completed in 2020, no further imaging needed per patient  Dexa: Completed in January 2021  BP Readings from Last 3 Encounters:  12/15/21 124/62  06/12/21 120/62  02/16/21 126/72       Review of Systems  Constitutional:  Negative for unexpected weight change.  HENT:  Negative for rhinorrhea.   Eyes:  Negative for visual disturbance.  Respiratory:  Negative for shortness of breath.   Cardiovascular:  Negative for chest pain.  Gastrointestinal:  Negative for constipation and diarrhea.  Genitourinary:  Negative for difficulty urinating.  Musculoskeletal:  Negative for arthralgias and myalgias.  Skin:  Negative for rash.  Allergic/Immunologic: Positive for environmental allergies.  Neurological:  Negative for dizziness and headaches.  Psychiatric/Behavioral:  The patient is not nervous/anxious.         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    "can't do either"   DVT (deep venous thrombosis), left "early 2000's"   RLE   Dysmetabolic syndrome X    Essential hypertension, benign    Headache(784.0) 05/2011   "pretty often since they put me on Parkinson's medicine"   History of  shingles    "as a teen and in my 36's"   Mixed hyperlipidemia    Morbid obesity ([place])    Neurodegenerative gait disorder    Personal history of thrombophlebitis    Syncope and collapse 12/06/11   "this am; I pass out fairly often"   Type II or unspecified type diabetes mellitus without mention of complication, not stated as uncontrolled 12/06/11   "not anymore; had lap band OR"    Social History   Socioeconomic History   Marital status: Widowed    Spouse name: Not on file   Number of children: 2   Years of education: Not on file   Highest education level: Some college, no degree  Occupational History   Occupation: Secondary school teacher: [employer]    Comment: retired, 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   Daughter is HPOA.   DNR.   Social Determinants of Health   Financial Resource Strain: Low Risk    Difficulty of Paying Living Expenses: Not hard at all  Food Insecurity: No Food Insecurity   Worried  About Running Out of Food in the Last Year: Never true   Ran Out of Food in the Last Year: Never true  Transportation Needs: No Transportation Needs   Lack of Transportation (Medical): No   Lack of Transportation (Non-Medical): No  Physical Activity: Insufficiently Active   Days of Exercise per Week: 3 days   Minutes of Exercise per Session: 20 min  Stress: No Stress Concern Present   Feeling of Stress : Not at all  Social Connections: Socially Isolated   Frequency of Communication with Friends and Family: Never   Frequency of Social Gatherings with Friends and Family: More than three times a week   Attends Religious Services: Never    Marine scientist or Organizations: No   Attends Archivist Meetings: Never   Marital Status: Widowed  Intimate Partner Violence: Not At Risk   Fear of Current or Ex-Partner: No   Emotionally Abused: No   Physically Abused: No   Sexually Abused: No    Past Surgical History:  Procedure Laterality Date   bladder tack  11/1995   BREAST BIOPSY  05/20/2000   bilaterally   BREAST CYST ASPIRATION  12/1998   bilaterally   CARDIAC CATHETERIZATION  2004   CATARACT EXTRACTION W/ INTRAOCULAR LENS  IMPLANT, BILATERAL     FRACTURE SURGERY  02/09/2006   bilateral elbows   FRACTURE SURGERY     right knee   FRACTURE SURGERY     tib plateau   KNEE ARTHROSCOPY  12/12/2002   right    LAPAROSCOPIC GASTRIC BANDING  2008   SEPTOPLASTY  08/1998   with antral window    TONSILLECTOMY AND ADENOIDECTOMY  1970   TUBAL LIGATION  58's    Family History  Problem Relation Age of Onset   Asthma Mother    COPD Father    Heart disease Father    Diabetes Sister    Vision loss Sister        legally blind   Hypertension Child    Hyperlipidemia Child    Diabetes Child    Diabetes Son    Breast cancer Neg Hx     Allergies  Allergen Reactions   Codeine Sulfate     REACTION: hallucinations   Penicillins Anaphylaxis, Swelling and Rash   Wellbutrin [Bupropion] Other (See Comments)    Seizures and hallucinations    Current Outpatient Medications on File Prior to Visit  Medication Sig Dispense Refill   aspirin 81 MG tablet Take 81 mg by mouth daily.     Bioflavonoid Products (GRAPE SEED PO) Take 1 tablet by mouth daily.      calcium carbonate (TUMS EX) 750 MG chewable tablet Chew 1 tablet by mouth as needed.      Calcium Carbonate-Vitamin D (CALCIUM + D PO) Take 600 mg by mouth daily.     carbidopa-levodopa (SINEMET IR) 25-100 MG tablet TAKE 3 TABLETS BY MOUTH 3 TIMES DAILY. 810 tablet 1   cetirizine (ZYRTEC) 10 MG tablet Take 10 mg by mouth daily.     Cholecalciferol (VITAMIN D3)  1000 UNITS CAPS Take 1 capsule by mouth daily.     clonazePAM (KLONOPIN) 0.5 MG tablet TAKE 1/2 TABLET BY MOUTH AT BEDTIME AND 1/2 DAILY AS NEEDED 90 tablet 0   escitalopram (LEXAPRO) 10 MG tablet TAKE 1 TABLET BY MOUTH EVERY DAY 90 tablet 1   feeding supplement (ENSURE COMPLETE) LIQD Take 237 mLs by mouth 2 (two) times daily between meals. 1 Bottle 11  fluticasone (FLONASE) 50 MCG/ACT nasal spray Place 2 sprays into both nostrils daily.     gabapentin (NEURONTIN) 100 MG capsule TAKE 1 CAPSULE (100 MG TOTAL) BY MOUTH THREE TIMES DAILY. 270 capsule 1   losartan-hydrochlorothiazide (HYZAAR) 50-12.5 MG tablet TAKE 1 TABLET BY MOUTH EVERY DAY FOR BLOOD PRESSURE 90 tablet 0   mirtazapine (REMERON) 30 MG tablet TAKE 1 TABLET (30 MG TOTAL) BY MOUTH AT BEDTIME. FOR SLEEP. 90 tablet 0   montelukast (SINGULAIR) 10 MG tablet TAKE 1 TABLET (10 MG TOTAL) BY MOUTH AT BEDTIME. FOR ALLERGIES 90 tablet 0   Multiple Vitamins-Minerals (CENTRUM SILVER ADULT 50+) TABS Take by mouth.     naproxen sodium (ALEVE) 220 MG tablet Take 220 mg by mouth in the morning and at bedtime.     rosuvastatin (CRESTOR) 5 MG tablet TAKE 1 TABLET (5 MG TOTAL) BY MOUTH EVERY EVENING. FOR CHOLESTEROL. 90 tablet 3   vitamin B-12 (CYANOCOBALAMIN) 1000 MCG tablet Take 1,000 mcg by mouth daily.     [DISCONTINUED] simvastatin (ZOCOR) 20 MG tablet TAKE 1 TABLET BY MOUTH EVERY DAY AT BEDTIME FOR CHOLESTEROL 90 tablet 0   No current facility-administered medications on file prior to visit.    BP 124/62   Pulse 72   Temp 97.7 F (36.5 C) (Oral)   Ht '5\' 1"'$  (1.549 m)   Wt 166 lb (75.3 kg)   SpO2 95%   BMI 31.37 kg/m  Objective:   Physical Exam HENT:     Right Ear: Tympanic membrane and ear canal normal.     Left Ear: Tympanic membrane and ear canal normal.     Nose: Nose normal.  Eyes:     Conjunctiva/sclera: Conjunctivae normal.     Pupils: Pupils are equal, round, and reactive to light.  Neck:     Thyroid: No thyromegaly.   Cardiovascular:     Rate and Rhythm: Normal rate and regular rhythm.     Heart sounds: No murmur heard. Pulmonary:     Effort: Pulmonary effort is normal.     Breath sounds: Normal breath sounds. No rales.  Abdominal:     General: Bowel sounds are normal.     Palpations: Abdomen is soft.     Tenderness: There is no abdominal tenderness.  Musculoskeletal:        General: Normal range of motion.     Cervical back: Neck supple.  Lymphadenopathy:     Cervical: No cervical adenopathy.  Skin:    General: Skin is warm and dry.     Findings: No rash.  Neurological:     Mental Status: She is alert and oriented to person, place, and time.     Cranial Nerves: No cranial nerve deficit.     Deep Tendon Reflexes: Reflexes are normal and symmetric.  Psychiatric:        Mood and Affect: Mood normal.          Assessment & Plan:

## 2021-12-15 NOTE — Assessment & Plan Note (Signed)
Repeat lipid panel pending. Continue rosuvastatin 5 mg daily. 

## 2021-12-15 NOTE — Assessment & Plan Note (Signed)
Continue off medications. Repeat A1C pending.

## 2021-12-15 NOTE — Assessment & Plan Note (Signed)
Controlled.  Continue Lexapro 10 mg HS, mirtazipine 30 mg HS.

## 2021-12-15 NOTE — Assessment & Plan Note (Signed)
Following with neurology, office notes from January 2023 reviewed.  Continue carbidopa-levodopa 25-100 mg, 3 tabs, TID. Continue clonazepam 0.5 mg PRN.

## 2021-12-15 NOTE — Patient Instructions (Signed)
Stop by the lab prior to leaving today. I will notify you of your results once received.   [facility] to schedule your mammogram and bone density scan.   It was a pleasure to see you today!  Preventive Care 65 Years and Older, Female Preventive care refers to lifestyle choices and visits with your health care provider that can promote health and wellness. Preventive care visits are also called wellness exams. What can I expect for my preventive care visit? Counseling Your health care provider may ask you questions about your: Medical history, including: Past medical problems. Family medical history. Pregnancy and menstrual history. History of falls. Current health, including: Memory and ability to understand (cognition). Emotional well-being. Home life and relationship well-being. Sexual activity and sexual health. Lifestyle, including: Alcohol, nicotine or tobacco, and drug use. Access to firearms. Diet, exercise, and sleep habits. Work and work environment. Sunscreen use. Safety issues such as seatbelt and bike helmet use. Physical exam Your health care provider will check your: Height and weight. These may be used to calculate your BMI (body mass index). BMI is a measurement that tells if you are at a healthy weight. Waist circumference. This measures the distance around your waistline. This measurement also tells if you are at a healthy weight and may help predict your risk of certain diseases, such as type 2 diabetes and high blood pressure. Heart rate and blood pressure. Body temperature. Skin for abnormal spots. What immunizations do I need?  Vaccines are usually given at various ages, according to a schedule. Your health care provider will recommend vaccines for you based on your age, medical history, and lifestyle or other factors, such as travel or where you work. What tests do I need? Screening Your health care provider may recommend screening tests for  certain conditions. This may include: Lipid and cholesterol levels. Hepatitis C test. Hepatitis B test. HIV (human immunodeficiency virus) test. STI (sexually transmitted infection) testing, if you are at risk. Lung cancer screening. Colorectal cancer screening. Diabetes screening. This is done by checking your blood sugar (glucose) after you have not eaten for a while (fasting). Mammogram. Talk with your health care provider about how often you should have regular mammograms. BRCA-related cancer screening. This may be done if you have a family history of breast, ovarian, tubal, or peritoneal cancers. Bone density scan. This is done to screen for osteoporosis. Talk with your health care provider about your test results, treatment options, and if necessary, the need for more tests. Follow these instructions at home: Eating and drinking  Eat a diet that includes fresh fruits and vegetables, whole grains, lean protein, and low-fat dairy products. Limit your intake of foods with high amounts of sugar, saturated fats, and salt. Take vitamin and mineral supplements as recommended by your health care provider. Do not drink alcohol if your health care provider tells you not to drink. If you drink alcohol: Limit how much you have to 0-1 drink a day. Know how much alcohol is in your drink. In the [place] one drink equals one 12 oz bottle of beer (355 mL), one 5 oz glass of wine (148 mL), or one 1 oz glass of hard liquor (44 mL). Lifestyle Brush your teeth every morning and night with fluoride toothpaste. Floss one time each day. Exercise for at least 30 minutes 5 or more days each week. Do not use any products that contain nicotine or tobacco. These products include cigarettes, chewing tobacco, and vaping devices, such as   e-cigarettes. If you need help quitting, ask your health care provider. Do not use drugs. If you are sexually active, practice safe sex. Use a condom or other form of protection in  order to prevent STIs. Take aspirin only as told by your health care provider. Make sure that you understand how much to take and what form to take. Work with your health care provider to find out whether it is safe and beneficial for you to take aspirin daily. Ask your health care provider if you need to take a cholesterol-lowering medicine (statin). Find healthy ways to manage stress, such as: Meditation, yoga, or listening to music. Journaling. Talking to a trusted person. Spending time with friends and family. Minimize exposure to UV radiation to reduce your risk of skin cancer. Safety Always wear your seat belt while driving or riding in a vehicle. Do not drive: If you have been drinking alcohol. Do not ride with someone who has been drinking. When you are tired or distracted. While texting. If you have been using any mind-altering substances or drugs. Wear a helmet and other protective equipment during sports activities. If you have firearms in your house, make sure you follow all gun safety procedures. What's next? Visit your health care provider once a year for an annual wellness visit. Ask your health care provider how often you should have your eyes and teeth checked. Stay up to date on all vaccines. This information is not intended to replace advice given to you by your health care provider. Make sure you discuss any questions you have with your health care provider. Document Revised: 01/07/2021 Document Reviewed: 01/07/2021 Elsevier Patient Education  [place].

## 2021-12-15 NOTE — Assessment & Plan Note (Signed)
Immunizations UTD. Bone density scan and mammogram due, discussed for patient to call and schedule. Orders are in.  Colonoscopy N/A given age.  Discussed the importance of a healthy diet and regular exercise in order for weight loss, and to reduce the risk of further co-morbidity.  Exam today stable Labs pending

## 2021-12-15 NOTE — Assessment & Plan Note (Signed)
Controlled.   Continue losartan-HCTZ 50-12.5 mg daily. CMP due and pending.

## 2021-12-15 NOTE — Assessment & Plan Note (Signed)
Bone density scan due, orders are in the system, discussed for patient to call and schedule. Continue Prolia injections semi-annually

## 2021-12-18 NOTE — Telephone Encounter (Signed)
Authorization Status Approved Authorization Number [id number]  Authorization Start Date 12-18-2021 Authorization End Date 12-19-2022

## 2021-12-18 NOTE — Telephone Encounter (Signed)
OOP cost is $0 Check [employer] website for PA Labs done on 12/15/21 CrCl is 87.53 mL/min, calcium normal at 10.2 Nurse visit on 12/22/21

## 2021-12-22 ENCOUNTER — Ambulatory Visit (INDEPENDENT_AMBULATORY_CARE_PROVIDER_SITE_OTHER): Payer: Medicare Other

## 2021-12-22 DIAGNOSIS — Z780 Asymptomatic menopausal state: Secondary | ICD-10-CM

## 2021-12-22 DIAGNOSIS — M810 Age-related osteoporosis without current pathological fracture: Secondary | ICD-10-CM

## 2021-12-22 MED ORDER — DENOSUMAB 60 MG/ML ~~LOC~~ SOSY
60.0000 mg | PREFILLED_SYRINGE | Freq: Once | SUBCUTANEOUS | Status: AC
  Administered 2021-12-22: 60 mg via SUBCUTANEOUS

## 2021-12-22 NOTE — Progress Notes (Signed)
Per orders of Allie Bossier, AGNP-C, injection of Prolia given by Francella Solian in left arm.  Patient tolerated injection well.

## 2021-12-30 ENCOUNTER — Other Ambulatory Visit: Payer: Self-pay | Admitting: Primary Care

## 2021-12-30 ENCOUNTER — Other Ambulatory Visit: Payer: Self-pay | Admitting: Neurology

## 2021-12-30 DIAGNOSIS — F341 Dysthymic disorder: Secondary | ICD-10-CM

## 2021-12-30 DIAGNOSIS — I10 Essential (primary) hypertension: Secondary | ICD-10-CM

## 2021-12-30 DIAGNOSIS — F418 Other specified anxiety disorders: Secondary | ICD-10-CM

## 2021-12-30 DIAGNOSIS — G20 Parkinson's disease: Secondary | ICD-10-CM

## 2021-12-30 DIAGNOSIS — E782 Mixed hyperlipidemia: Secondary | ICD-10-CM

## 2021-12-30 DIAGNOSIS — J309 Allergic rhinitis, unspecified: Secondary | ICD-10-CM

## 2021-12-31 ENCOUNTER — Other Ambulatory Visit: Payer: Self-pay

## 2022-01-15 ENCOUNTER — Encounter: Payer: Self-pay | Admitting: Family

## 2022-01-15 ENCOUNTER — Ambulatory Visit (INDEPENDENT_AMBULATORY_CARE_PROVIDER_SITE_OTHER): Payer: Medicare Other | Admitting: Family

## 2022-01-15 VITALS — BP 128/66 | HR 79 | Temp 98.6°F | Resp 16 | Ht 61.0 in | Wt 166.5 lb

## 2022-01-15 DIAGNOSIS — N3000 Acute cystitis without hematuria: Secondary | ICD-10-CM

## 2022-01-15 DIAGNOSIS — R3915 Urgency of urination: Secondary | ICD-10-CM | POA: Insufficient documentation

## 2022-01-15 HISTORY — DX: Urgency of urination: R39.15

## 2022-01-15 HISTORY — DX: Acute cystitis without hematuria: N30.00

## 2022-01-15 LAB — POCT URINALYSIS DIP (CLINITEK)
Bilirubin, UA: NEGATIVE
Blood, UA: NEGATIVE
Glucose, UA: NEGATIVE mg/dL
Nitrite, UA: NEGATIVE
Spec Grav, UA: 1.025 (ref 1.010–1.025)
Urobilinogen, UA: 0.2 E.U./dL
pH, UA: 6 (ref 5.0–8.0)

## 2022-01-15 MED ORDER — CIPROFLOXACIN HCL 500 MG PO TABS: 500.0000 mg | ORAL_TABLET | Freq: Two times a day (BID) | ORAL | 0 refills | Status: AC

## 2022-01-15 NOTE — Progress Notes (Signed)
Established Patient Office Visit  Subjective:  Patient ID: [name], female    DOB: 1945-10-29  Age: 76 y.o. MRN: [id number]  CC:  Chief Complaint  Patient presents with   Urinary Tract Infection    HPI DEYANNA JABOUR is here today with concerns.   Increased urinary incontinence, has had bladder tacked in the past, years ago. Increased urinary frequency. A little dysuria with peeing. Wears pull up diaper pads.   No fever no chills.  No back pain.   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    "can't do either"   DVT (deep venous thrombosis), left "early 2000's"   RLE   Dysmetabolic syndrome X    Essential hypertension, benign    Headache(784.0) 05/2011   "pretty often since they put me on Parkinson's medicine"   History of shingles    "as a teen and in my 30's"   Mixed hyperlipidemia    Morbid obesity (HCC)    Neurodegenerative gait disorder    Personal history of thrombophlebitis    Syncope and collapse 12/06/11   "this am; I pass out fairly often"   Type II or unspecified type diabetes mellitus without mention of complication, not stated as uncontrolled 12/06/11   "not anymore; had lap band OR"    Past Surgical History:  Procedure Laterality Date   bladder tack  11/1995   BREAST BIOPSY  05/20/2000   bilaterally   BREAST CYST ASPIRATION  12/1998   bilaterally   CARDIAC CATHETERIZATION  2004   CATARACT EXTRACTION W/ INTRAOCULAR LENS  IMPLANT, BILATERAL     FRACTURE SURGERY  02/09/2006   bilateral elbows   FRACTURE SURGERY     right knee   FRACTURE SURGERY     tib plateau   KNEE ARTHROSCOPY  12/12/2002   right    LAPAROSCOPIC GASTRIC BANDING  2008   SEPTOPLASTY  08/1998   with antral window    TONSILLECTOMY AND ADENOIDECTOMY  1970   TUBAL LIGATION  1970's     Family History  Problem Relation Age of Onset   Asthma Mother    COPD Father    Heart disease Father    Diabetes Sister    Vision loss Sister        legally blind   Hypertension Child    Hyperlipidemia Child    Diabetes Child    Diabetes Son    Breast cancer Neg Hx     Social History   Socioeconomic History   Marital status: Widowed    Spouse name: Not on file   Number of children: 2   Years of education: Not on file   Highest education level: Some college, no degree  Occupational History   Occupation: Restaurant manager, fast food: [employer]    Comment: retired, 04/2012  Tobacco Use   Smoking status: Never   Smokeless tobacco: Never  Vaping Use   Vaping Use: Never used  Substance and Sexual Activity   Alcohol use: No    Comment: none   Drug use: No   Sexual activity: Not Currently  Other Topics Concern   Not on file  Social History Narrative   Right handed   One story home   Lives with  daughter and granddaughter.   Daughter is HPOA.   DNR.   Social Determinants of Health   Financial Resource Strain: Low Risk  (08/20/2021)   Overall Financial Resource Strain (CARDIA)    Difficulty of Paying Living Expenses: Not hard at all  Food Insecurity: No Food Insecurity (08/20/2021)   Hunger Vital Sign    Worried About Running Out of Food in the Last Year: Never true    Ran Out of Food in the Last Year: Never true  Transportation Needs: No Transportation Needs (08/20/2021)   PRAPARE - Administrator, Civil Service (Medical): No    Lack of Transportation (Non-Medical): No  Physical Activity: Insufficiently Active (08/20/2021)   Exercise Vital Sign    Days of Exercise per Week: 3 days    Minutes of Exercise per Session: 20 min  Stress: No Stress Concern Present (08/20/2021)   [employer] of Occupational Health - Occupational Stress Questionnaire    Feeling of Stress : Not at all  Social Connections: Socially Isolated (08/20/2021)   Social  Connection and Isolation Panel [NHANES]    Frequency of Communication with Friends and Family: Never    Frequency of Social Gatherings with Friends and Family: More than three times a week    Attends Religious Services: Never    Database administrator or Organizations: No    Attends Banker Meetings: Never    Marital Status: Widowed  Intimate Partner Violence: Not At Risk (08/20/2021)   Humiliation, Afraid, Rape, and Kick questionnaire    Fear of Current or Ex-Partner: No    Emotionally Abused: No    Physically Abused: No    Sexually Abused: No    Outpatient Medications Prior to Visit  Medication Sig Dispense Refill   aspirin 81 MG tablet Take 81 mg by mouth daily.     Bacillus Coagulans-Inulin (ALIGN PREBIOTIC-PROBIOTIC PO) Take by mouth.     Bioflavonoid Products (GRAPE SEED PO) Take 1 tablet by mouth daily.      calcium carbonate (TUMS EX) 750 MG chewable tablet Chew 1 tablet by mouth as needed.      Calcium Carbonate-Vitamin D (CALCIUM + D PO) Take 600 mg by mouth daily.     carbidopa-levodopa (SINEMET IR) 25-100 MG tablet TAKE 3 TABLETS BY MOUTH 3 TIMES DAILY. 810 tablet 0   cetirizine (ZYRTEC) 10 MG tablet Take 10 mg by mouth daily.     Cholecalciferol (VITAMIN D3) 1000 UNITS CAPS Take 1 capsule by mouth daily.     clonazePAM (KLONOPIN) 0.5 MG tablet TAKE 1/2 TABLET BY MOUTH AT BEDTIME AND 1/2 DAILY AS NEEDED 90 tablet 0   escitalopram (LEXAPRO) 10 MG tablet TAKE 1 TABLET BY MOUTH EVERY DAY 90 tablet 0   feeding supplement (ENSURE COMPLETE) LIQD Take 237 mLs by mouth 2 (two) times daily between meals. 1 Bottle 11   fluticasone (FLONASE) 50 MCG/ACT nasal spray Place 2 sprays into both nostrils daily.     gabapentin (NEURONTIN) 100 MG capsule TAKE 1 CAPSULE (100 MG TOTAL) BY MOUTH 3 TIMES A DAY 270 capsule 0   losartan-hydrochlorothiazide (HYZAAR) 50-12.5 MG tablet TAKE 1 TABLET BY MOUTH EVERY DAY FOR BLOOD PRESSURE 90 tablet 3   mirtazapine (REMERON) 30 MG tablet TAKE  1 TABLET (30 MG TOTAL) BY MOUTH AT BEDTIME. FOR SLEEP. 90 tablet 3   montelukast (SINGULAIR) 10 MG tablet TAKE 1 TABLET (10 MG TOTAL) BY MOUTH AT BEDTIME. FOR ALLERGIES 90 tablet 3   Multiple Vitamins-Minerals (  CENTRUM SILVER ADULT 50+) TABS Take by mouth.     naproxen sodium (ALEVE) 220 MG tablet Take 220 mg by mouth in the morning and at bedtime.     rosuvastatin (CRESTOR) 5 MG tablet TAKE 1 TABLET (5 MG TOTAL) BY MOUTH EVERY EVENING. FOR CHOLESTEROL. 90 tablet 3   vitamin B-12 (CYANOCOBALAMIN) 1000 MCG tablet Take 1,000 mcg by mouth daily.     No facility-administered medications prior to visit.    Allergies  Allergen Reactions   Codeine Sulfate     REACTION: hallucinations   Penicillins Anaphylaxis, Swelling and Rash   Wellbutrin [Bupropion] Other (See Comments)    Seizures and hallucinations        Objective:    Physical Exam Constitutional:      Appearance: Normal appearance. She is obese.  Pulmonary:     Effort: Pulmonary effort is normal.  Abdominal:     General: Abdomen is flat.     Tenderness: There is no abdominal tenderness. There is no right CVA tenderness or left CVA tenderness.  Neurological:     General: No focal deficit present.     Mental Status: She is alert.  Psychiatric:        Mood and Affect: Mood normal.        Behavior: Behavior normal.        Thought Content: Thought content normal.        Judgment: Judgment normal.     BP 128/66   Pulse 79   Temp 98.6 F (37 C)   Resp 16   Ht 5\' 1"  (1.549 m)   Wt 166 lb 8 oz (75.5 kg)   SpO2 95%   BMI 31.46 kg/m  Wt Readings from Last 3 Encounters:  01/15/22 166 lb 8 oz (75.5 kg)  12/15/21 166 lb (75.3 kg)  08/20/21 160 lb (72.6 kg)     Health Maintenance Due  Topic Date Due   COVID-19 Vaccine (4 - Pfizer series) 08/08/2020   MAMMOGRAM  08/14/2020    There are no preventive care reminders to display for this patient.  Lab Results  Component Value Date   TSH 1.04 12/07/2017   Lab  Results  Component Value Date   WBC 7.9 12/15/2021   HGB 13.4 12/15/2021   HCT 40.2 12/15/2021   MCV 94.5 12/15/2021   PLT 210.0 12/15/2021   Lab Results  Component Value Date   NA 142 12/15/2021   K 4.1 12/15/2021   CO2 34 (H) 12/15/2021   GLUCOSE 155 (H) 12/15/2021   BUN 19 12/15/2021   CREATININE 0.65 12/15/2021   BILITOT 0.5 12/15/2021   ALKPHOS 63 12/15/2021   AST 11 12/15/2021   ALT 2 12/15/2021   PROT 6.6 12/15/2021   ALBUMIN 3.9 12/15/2021   CALCIUM 10.2 12/15/2021   ANIONGAP 6 01/13/2021   GFR 85.19 12/15/2021   Lab Results  Component Value Date   HGBA1C 7.6 (H) 12/15/2021      Assessment & Plan:   Problem List Items Addressed This Visit       Genitourinary   Acute cystitis without hematuria - Primary    rx cipro 500 mg po bid x 3 days  antbx sent to pharmacy, pt to take as directed. Encouraged increased water intake throughout the day. Urine culture/reflex pending results. Choosing to treat due to being symptomatic. If no improvement in the next 2 days pt advised to let me know.       Relevant Medications   ciprofloxacin (CIPRO)  500 MG tablet   Other Relevant Orders   Urine Culture     Other   Urinary urgency    poct urine dip in office Urine culture ordered pending results       Relevant Medications   ciprofloxacin (CIPRO) 500 MG tablet   Other Relevant Orders   POCT URINALYSIS DIP (CLINITEK) (Completed)   Urine Culture    Meds ordered this encounter  Medications   ciprofloxacin (CIPRO) 500 MG tablet    Sig: Take 1 tablet (500 mg total) by mouth 2 (two) times daily for 3 days.    Dispense:  6 tablet    Refill:  0    Order Specific Question:   Supervising Provider    Answer:   BEDSOLE, AMY E [[id number]]    Follow-up: Return if symptoms worsen or fail to improve with pcp.    Mort Sawyers, FNP

## 2022-01-15 NOTE — Assessment & Plan Note (Signed)
rx cipro 500 mg po bid x 3 days antbx sent to pharmacy, pt to take as directed. Encouraged increased water intake throughout the day. Urine culture/reflex pending results. Choosing to treat due to being symptomatic. If no improvement in the next 2 days pt advised to let me know.

## 2022-01-18 LAB — URINE CULTURE
MICRO NUMBER:: 13564925
SPECIMEN QUALITY:: ADEQUATE

## 2022-01-28 ENCOUNTER — Other Ambulatory Visit: Payer: Self-pay | Admitting: Primary Care

## 2022-01-28 DIAGNOSIS — F341 Dysthymic disorder: Secondary | ICD-10-CM

## 2022-03-04 ENCOUNTER — Other Ambulatory Visit: Payer: Self-pay | Admitting: Primary Care

## 2022-03-04 DIAGNOSIS — F341 Dysthymic disorder: Secondary | ICD-10-CM

## 2022-03-13 ENCOUNTER — Other Ambulatory Visit: Payer: Self-pay | Admitting: Neurology

## 2022-03-13 DIAGNOSIS — G20 Parkinson's disease: Secondary | ICD-10-CM

## 2022-03-13 DIAGNOSIS — F418 Other specified anxiety disorders: Secondary | ICD-10-CM

## 2022-03-16 NOTE — Progress Notes (Deleted)
Assessment/Plan:   1.  Parkinsons Disease  -Continue carbidopa/levodopa 25/100, 3 tablets 3 times per day  2.  Depression  -On Lexapro  -On mirtazapine  -Managed by PCP  3.  RBD and GAD  -On clonazepam 0.5 mg, half tablet at bedtime and will take half tablet as needed in the day for anxiety.  -PDMP reviewed.  No red flags.  4.  Chronic headache  -On gabapentin, 100 mg 3 times per day.  -Saw Dr. Tommi Rumps in the remote past.  5.  MCI  -Patient last had neurocognitive testing in August, 2021 with Dr. Nicole Kindred.  6.  DM   -This appears to be fairly new in onset.  Last A1c was fairly significantly elevated at 7.6.  Primary care did not recommend medication and recommended diet changes.  Subjective:   [name] was seen today in follow up for Parkinsons disease.  My previous records were reviewed prior to todays visit as well as outside records available to me.  Pt denies falls.  Pt denies lightheadedness, near syncope.  No hallucinations.  Mood has been good.  Remains on low-dose clonazepam for RBD and generalized anxiety disorder.  Last filled May 25.  PDMP reviewed.  No red flags.  Labs from primary care reviewed.  Last blood sugars on blood work were 155 (was all the way up to 213 a year ago).  Hemoglobin A1c was 7.6.  Current prescribed movement disorder medications:  Carbidopa/levodopa 25/100, 3 tablets 3 times per day Mirtazapine, 30 mg at night Gabapentin, 100 mg 3 times per day Clonazepam, 0.25 mg twice daily    ALLERGIES:   Allergies  Allergen Reactions   Codeine Sulfate     REACTION: hallucinations   Penicillins Anaphylaxis, Swelling and Rash   Wellbutrin [Bupropion] Other (See Comments)    Seizures and hallucinations    CURRENT MEDICATIONS:  Outpatient Encounter Medications as of 03/22/2022  Medication Sig   aspirin 81 MG tablet Take 81 mg by mouth daily.   Bacillus Coagulans-Inulin (ALIGN PREBIOTIC-PROBIOTIC PO) Take by mouth.   Bioflavonoid  Products (GRAPE SEED PO) Take 1 tablet by mouth daily.    calcium carbonate (TUMS EX) 750 MG chewable tablet Chew 1 tablet by mouth as needed.    Calcium Carbonate-Vitamin D (CALCIUM + D PO) Take 600 mg by mouth daily.   carbidopa-levodopa (SINEMET IR) 25-100 MG tablet TAKE 3 TABLETS BY MOUTH 3 TIMES DAILY.   cetirizine (ZYRTEC) 10 MG tablet Take 10 mg by mouth daily.   Cholecalciferol (VITAMIN D3) 1000 UNITS CAPS Take 1 capsule by mouth daily.   clonazePAM (KLONOPIN) 0.5 MG tablet TAKE 1/2 TABLET BY MOUTH AT BEDTIME AND 1/2 DAILY AS NEEDED   escitalopram (LEXAPRO) 10 MG tablet TAKE 1 TABLET BY MOUTH EVERY DAY   feeding supplement (ENSURE COMPLETE) LIQD Take 237 mLs by mouth 2 (two) times daily between meals.   fluticasone (FLONASE) 50 MCG/ACT nasal spray Place 2 sprays into both nostrils daily.   gabapentin (NEURONTIN) 100 MG capsule TAKE 1 CAPSULE BY MOUTH THREE TIMES A DAY   losartan-hydrochlorothiazide (HYZAAR) 50-12.5 MG tablet TAKE 1 TABLET BY MOUTH EVERY DAY FOR BLOOD PRESSURE   mirtazapine (REMERON) 30 MG tablet TAKE 1 TABLET (30 MG TOTAL) BY MOUTH AT BEDTIME. FOR SLEEP.   montelukast (SINGULAIR) 10 MG tablet TAKE 1 TABLET (10 MG TOTAL) BY MOUTH AT BEDTIME. FOR ALLERGIES   Multiple Vitamins-Minerals (CENTRUM SILVER ADULT 50+) TABS Take by mouth.   naproxen sodium (ALEVE) 220 MG  tablet Take 220 mg by mouth in the morning and at bedtime.   rosuvastatin (CRESTOR) 5 MG tablet TAKE 1 TABLET (5 MG TOTAL) BY MOUTH EVERY EVENING. FOR CHOLESTEROL.   vitamin B-12 (CYANOCOBALAMIN) 1000 MCG tablet Take 1,000 mcg by mouth daily.   [DISCONTINUED] simvastatin (ZOCOR) 20 MG tablet TAKE 1 TABLET BY MOUTH EVERY DAY AT BEDTIME FOR CHOLESTEROL   No facility-administered encounter medications on file as of 03/22/2022.    Objective:   PHYSICAL EXAMINATION:    VITALS:   There were no vitals filed for this visit.   GEN:  The patient appears stated age and is in NAD. HEENT:  Normocephalic, atraumatic.   The mucous membranes are moist. The superficial temporal arteries are without ropiness or tenderness.   Neurological examination:  Orientation: The patient is alert and oriented x3. Cranial nerves: There is good facial symmetry with minimal facial hypomimia. The speech is fluent and clear. Soft palate rises symmetrically and there is no tongue deviation. Hearing is intact to conversational tone. Sensation: Sensation is intact to light touch throughout Motor: Strength is at least antigravity x4.  Movement examination: Tone: There is nl tone in the UE/LE Abnormal movements: none Coordination:  There is minimal decremation with RAM's, with finger taps bilaterally Gait and Station: Patient pushes off of the chair to arise. The patient's stride length is slow and tenuous.    I have reviewed and interpreted the following labs independently    Chemistry      Component Value Date/Time   NA 142 12/15/2021 0842   K 4.1 12/15/2021 0842   CL 100 12/15/2021 0842   CO2 34 (H) 12/15/2021 0842   BUN 19 12/15/2021 0842   CREATININE 0.65 12/15/2021 0842   CREATININE 0.71 12/14/2019 1504      Component Value Date/Time   CALCIUM 10.2 12/15/2021 0842   ALKPHOS 63 12/15/2021 0842   AST 11 12/15/2021 0842   ALT 2 12/15/2021 0842   BILITOT 0.5 12/15/2021 0842       Lab Results  Component Value Date   WBC 7.9 12/15/2021   HGB 13.4 12/15/2021   HCT 40.2 12/15/2021   MCV 94.5 12/15/2021   PLT 210.0 12/15/2021    Lab Results  Component Value Date   TSH 1.04 12/07/2017   Lab Results  Component Value Date   HGBA1C 7.6 (H) 12/15/2021     Total time spent on today's visit was *** minutes, including both face-to-face time and nonface-to-face time.  Time included that spent on review of records (prior notes available to me/labs/imaging if pertinent), discussing treatment and goals, answering patient's questions and coordinating care.  Cc:  Pleas Koch, NP

## 2022-03-21 ENCOUNTER — Emergency Department (HOSPITAL_COMMUNITY): Payer: Medicare Other

## 2022-03-21 ENCOUNTER — Other Ambulatory Visit: Payer: Self-pay

## 2022-03-21 ENCOUNTER — Encounter (HOSPITAL_COMMUNITY): Payer: Self-pay

## 2022-03-21 ENCOUNTER — Emergency Department (HOSPITAL_COMMUNITY)
Admission: EM | Admit: 2022-03-21 | Discharge: 2022-03-21 | Disposition: A | Discharge status: Home / Self Care | Payer: Medicare Other | Attending: Emergency Medicine | Admitting: Emergency Medicine

## 2022-03-21 DIAGNOSIS — Z743 Need for continuous supervision: Secondary | ICD-10-CM | POA: Diagnosis not present

## 2022-03-21 DIAGNOSIS — S8001XA Contusion of right knee, initial encounter: Secondary | ICD-10-CM | POA: Diagnosis not present

## 2022-03-21 DIAGNOSIS — Z79899 Other long term (current) drug therapy: Secondary | ICD-10-CM | POA: Insufficient documentation

## 2022-03-21 DIAGNOSIS — R6889 Other general symptoms and signs: Secondary | ICD-10-CM | POA: Diagnosis not present

## 2022-03-21 DIAGNOSIS — S0003XA Contusion of scalp, initial encounter: Secondary | ICD-10-CM

## 2022-03-21 DIAGNOSIS — Y92003 Bedroom of unspecified non-institutional (private) residence as the place of occurrence of the external cause: Secondary | ICD-10-CM | POA: Insufficient documentation

## 2022-03-21 DIAGNOSIS — R519 Headache, unspecified: Secondary | ICD-10-CM | POA: Insufficient documentation

## 2022-03-21 DIAGNOSIS — Z043 Encounter for examination and observation following other accident: Secondary | ICD-10-CM | POA: Diagnosis not present

## 2022-03-21 DIAGNOSIS — W19XXXA Unspecified fall, initial encounter: Secondary | ICD-10-CM

## 2022-03-21 DIAGNOSIS — W01190A Fall on same level from slipping, tripping and stumbling with subsequent striking against furniture, initial encounter: Secondary | ICD-10-CM | POA: Insufficient documentation

## 2022-03-21 DIAGNOSIS — I6523 Occlusion and stenosis of bilateral carotid arteries: Secondary | ICD-10-CM | POA: Diagnosis not present

## 2022-03-21 DIAGNOSIS — S0990XA Unspecified injury of head, initial encounter: Secondary | ICD-10-CM | POA: Diagnosis not present

## 2022-03-21 DIAGNOSIS — E119 Type 2 diabetes mellitus without complications: Secondary | ICD-10-CM | POA: Diagnosis not present

## 2022-03-21 DIAGNOSIS — Z7982 Long term (current) use of aspirin: Secondary | ICD-10-CM | POA: Insufficient documentation

## 2022-03-21 DIAGNOSIS — M1711 Unilateral primary osteoarthritis, right knee: Secondary | ICD-10-CM | POA: Diagnosis not present

## 2022-03-21 DIAGNOSIS — I10 Essential (primary) hypertension: Secondary | ICD-10-CM | POA: Diagnosis not present

## 2022-03-21 DIAGNOSIS — F0390 Unspecified dementia without behavioral disturbance: Secondary | ICD-10-CM | POA: Insufficient documentation

## 2022-03-21 DIAGNOSIS — M25561 Pain in right knee: Secondary | ICD-10-CM | POA: Diagnosis not present

## 2022-03-21 DIAGNOSIS — S80919A Unspecified superficial injury of unspecified knee, initial encounter: Secondary | ICD-10-CM | POA: Diagnosis not present

## 2022-03-21 DIAGNOSIS — M810 Age-related osteoporosis without current pathological fracture: Secondary | ICD-10-CM | POA: Diagnosis not present

## 2022-03-21 DIAGNOSIS — I672 Cerebral atherosclerosis: Secondary | ICD-10-CM | POA: Diagnosis not present

## 2022-03-21 DIAGNOSIS — S8991XA Unspecified injury of right lower leg, initial encounter: Secondary | ICD-10-CM | POA: Diagnosis present

## 2022-03-21 MED ORDER — LIDOCAINE 5 % EX PTCH: 1.0000 | MEDICATED_PATCH | CUTANEOUS | 0 refills | Status: DC

## 2022-03-21 MED ORDER — LIDOCAINE 5 % EX PTCH
1.0000 | MEDICATED_PATCH | CUTANEOUS | Status: DC
  Administered 2022-03-21: 1 via TRANSDERMAL
  Filled 2022-03-21: qty 1

## 2022-03-21 MED ORDER — ACETAMINOPHEN 500 MG PO TABS
1000.0000 mg | ORAL_TABLET | Freq: Once | ORAL | Status: AC
Start: 2022-03-21 — End: 2022-03-21
  Administered 2022-03-21: 1000 mg via ORAL
  Filled 2022-03-21: qty 2

## 2022-03-21 NOTE — ED Provider Notes (Signed)
[facility] DEPT Provider Note   CSN: [id number] Arrival date & time: 03/21/22  1253     History  Chief Complaint  Patient presents with   [name]    [name] is a 76 y.o. female.  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  Patient reports that she had woken up this morning and sometime between 10 and 11:00 she heard her cats fighting in the bedroom and she went to check on him.  As she was coming back out she got her foot tangled in a cord which caused her to fall forward.  She does recall hitting her shoulder on the door frame but falling on her right knee.  She also recalls hitting her head.  She denies any loss of consciousness.  She is not exactly sure how long she was on the floor but was eventually able to call someone who came and helped her.  She currently is complaining of a headache and pain in her right knee.  She denies any pain in her shoulders, chest, abdomen, elbows, wrists or hips.  The history is provided by the patient.  Fall       Home Medications Prior to Admission medications   Medication Sig Start Date End Date Taking? Authorizing Provider  lidocaine (LIDODERM) 5 % Place 1 patch onto the skin daily. Remove & Discard patch within 12 hours or as directed by MD.  Place on the right knee for pain control 03/21/22  Yes Blanchie Dessert, MD  aspirin 81 MG tablet Take 81 mg by mouth daily.    [provider]  Bacillus Coagulans-Inulin (ALIGN PREBIOTIC-PROBIOTIC PO) Take by mouth.    [provider]  Bioflavonoid Products (GRAPE SEED PO) Take 1 tablet by mouth daily.     [provider]  calcium carbonate (TUMS EX) 750 MG chewable tablet Chew 1 tablet by mouth as needed.     [provider]  Calcium Carbonate-Vitamin D (CALCIUM + D PO) Take 600 mg by  mouth daily.    [provider]  carbidopa-levodopa (SINEMET IR) 25-100 MG tablet TAKE 3 TABLETS BY MOUTH 3 TIMES DAILY. 03/15/22   Tat, Eustace Quail, DO  cetirizine (ZYRTEC) 10 MG tablet Take 10 mg by mouth daily.    [provider]  Cholecalciferol (VITAMIN D3) 1000 UNITS CAPS Take 1 capsule by mouth daily.    [provider]  clonazePAM (KLONOPIN) 0.5 MG tablet TAKE 1/2 TABLET BY MOUTH AT BEDTIME AND 1/2 DAILY AS NEEDED 09/21/21   Tat, Eustace Quail, DO  escitalopram (LEXAPRO) 10 MG tablet TAKE 1 TABLET BY MOUTH EVERY DAY 03/15/22   Tat, Eustace Quail, DO  feeding supplement (ENSURE COMPLETE) LIQD Take 237 mLs by mouth 2 (two) times daily between meals. 04/01/12   Robbie Lis, MD  fluticasone [facility] 50 MCG/ACT nasal spray Place 2 sprays into both nostrils daily. 10/02/20   [provider]  gabapentin (NEURONTIN) 100 MG capsule TAKE 1 CAPSULE BY MOUTH THREE TIMES A DAY 03/15/22   Tat, Eustace Quail, DO  losartan-hydrochlorothiazide (HYZAAR) 50-12.5 MG tablet TAKE 1 TABLET BY MOUTH EVERY DAY FOR BLOOD PRESSURE 12/31/21   Pleas Koch, NP  mirtazapine (REMERON) 30 MG tablet TAKE 1 TABLET (30 MG TOTAL) BY MOUTH AT BEDTIME. FOR SLEEP. 12/31/21   Pleas Koch, NP  montelukast (SINGULAIR) 10 MG tablet  TAKE 1 TABLET (10 MG TOTAL) BY MOUTH AT BEDTIME. FOR ALLERGIES 12/31/21   Pleas Koch, NP  Multiple Vitamins-Minerals (CENTRUM SILVER ADULT 50+) TABS Take by mouth.    [provider]  naproxen sodium (ALEVE) 220 MG tablet Take 220 mg by mouth in the morning and at bedtime.    [provider]  rosuvastatin (CRESTOR) 5 MG tablet TAKE 1 TABLET (5 MG TOTAL) BY MOUTH EVERY EVENING. FOR CHOLESTEROL. 12/31/21   Pleas Koch, NP  vitamin B-12 (CYANOCOBALAMIN) 1000 MCG tablet Take 1,000 mcg by mouth daily.    [provider]  simvastatin (ZOCOR) 20 MG tablet TAKE 1 TABLET BY MOUTH EVERY DAY AT BEDTIME FOR CHOLESTEROL 10/01/19 01/02/20  Pleas Koch,  NP      Allergies    Codeine sulfate, Penicillins, Amoxapine and related, and Wellbutrin [bupropion]    Review of Systems   Review of Systems  Physical Exam Updated Vital Signs BP 132/64 (BP Location: Left Arm)   Pulse 69   Temp 99.1 F (37.3 C) (Oral)   Resp 16   Ht 5' 1.5" (1.562 m)   Wt 73.9 kg   SpO2 96%   BMI 30.30 kg/m  Physical Exam Vitals and nursing note reviewed.  Constitutional:      General: She is not in acute distress.    Appearance: She is well-developed.  HENT:     Head: Normocephalic.     Comments: Several small contusions noted to the scalp Eyes:     Pupils: Pupils are equal, round, and reactive to light.  Cardiovascular:     Rate and Rhythm: Normal rate and regular rhythm.     Heart sounds: Normal heart sounds. No murmur heard.    No friction rub.  Pulmonary:     Effort: Pulmonary effort is normal.     Breath sounds: Normal breath sounds. No wheezing or rales.  Abdominal:     General: Bowel sounds are normal. There is no distension.     Palpations: Abdomen is soft.     Tenderness: There is no abdominal tenderness. There is no guarding or rebound.  Musculoskeletal:        General: Tenderness present. Normal range of motion.     Right shoulder: Normal.     Left shoulder: Normal.     Right elbow: Normal.     Left elbow: Normal.     Right wrist: Normal.     Left wrist: Normal.     Cervical back: Normal range of motion and neck supple. No tenderness. No spinous process tenderness or muscular tenderness.     Right hip: Normal.     Left hip: Normal.     Right ankle: Normal.       Legs:     Comments: No edema  Skin:    General: Skin is warm and dry.     Findings: No rash.  Neurological:     Mental Status: She is alert and oriented to person, place, and time.     Cranial Nerves: No cranial nerve deficit.  Psychiatric:        Mood and Affect: Mood normal.        Behavior: Behavior normal.     ED Results / Procedures / Treatments    Labs (all labs ordered are listed, but only abnormal results are displayed) Labs Reviewed - No data to display  EKG None  Radiology CT Head Wo Contrast  Result Date: 03/21/2022 CLINICAL DATA:  Head trauma  EXAM: CT HEAD WITHOUT CONTRAST TECHNIQUE: Contiguous axial images were obtained from the base of the skull through the vertex without intravenous contrast. RADIATION DOSE REDUCTION: This exam was performed according to the departmental dose-optimization program which includes automated exposure control, adjustment of the mA and/or kV according to patient size and/or use of iterative reconstruction technique. COMPARISON:  04/03/2012 FINDINGS: Brain: Generalized atrophy. Normal ventricular morphology. No midline shift or mass effect. Small vessel chronic ischemic changes of deep cerebral white matter. No intracranial hemorrhage, mass lesion, evidence of acute infarction, or extra-axial fluid collection. Vascular: No hyperdense vessels. Atherosclerotic calcification of internal carotid arteries at skull base Skull: Demineralized but intact Sinuses/Orbits: Clear.  Prior maxillary sinus decompressions. Other: N/A IMPRESSION: Atrophy with small vessel chronic ischemic changes of deep cerebral white matter. No acute intracranial abnormalities. Electronically Signed   By: Lavonia Dana M.D.   On: 03/21/2022 16:23   DG Knee Complete 4 Views Right  Result Date: 03/21/2022 CLINICAL DATA:  Fall EXAM: RIGHT KNEE - COMPLETE 4 VIEW COMPARISON:  Tibia and fibula radiograph dated November 27th 2013 FINDINGS: No evidence of fracture, dislocation, or joint effusion. Tricompartmental degenerative changes, severe at the lateral compartment, moderate of the patellofemoral compartment and mild of the medial compartment, increased when compared with prior. Soft tissues are unremarkable. IMPRESSION: 1. No acute osseous abnormality. 2. Tricompartmental degenerative changes, increased when compared with prior exam.  Electronically Signed   By: Yetta Glassman M.D.   On: 03/21/2022 13:43    Procedures Procedures    Medications Ordered in ED Medications  lidocaine (LIDODERM) 5 % 1 patch (1 patch Transdermal Patch Applied 03/21/22 1614)  acetaminophen (TYLENOL) tablet 1,000 mg (1,000 mg Oral Given 03/21/22 1614)    ED Course/ Medical Decision Making/ A&P                           Medical Decision Making Amount and/or Complexity of Data Reviewed Radiology: ordered and independent interpretation performed. Decision-making details documented in ED Course.  Risk OTC drugs. Prescription drug management.   Pt with multiple medical problems and comorbidities and presenting today with a complaint that caries a high risk for morbidity and mortality.  Here today after a fall when she tripped.  She is complaining of knee pain, headache.  She does not take any antiemetic coagulation.  Patient appears intact and is able to tell the whole story.  She reports being unable to ambulate since the fall due to pain in her knee.  There is no evidence of deformity but concern for possible fracture and a plain film will be done.  Also because of her complaint of headache will ensure no evidence of intracranial hemorrhage.  Patient given Tylenol and Lidoderm patch applied to her knee.  CT head is neg. Pt d/ced home in good condition.          Final Clinical Impression(s) / ED Diagnoses Final diagnoses:  Fall, initial encounter  Contusion of right knee, initial encounter  Contusion of scalp, initial encounter    Rx / DC Orders ED Discharge Orders          Ordered    lidocaine (LIDODERM) 5 %  Every 24 hours        03/21/22 1712              Blanchie Dessert, MD 03/21/22 1712

## 2022-03-21 NOTE — Discharge Instructions (Addendum)
The x-ray of the knee and the scan of the head are both normal today.  There is no sign of broken bones or internal bleeding.  You will be sore over the next few days and you need to take extra strength Tylenol every 6 hours and use the Lidoderm patches as needed for pain.

## 2022-03-21 NOTE — ED Triage Notes (Signed)
Pt BIB [facility] with injuries from a mechanical fall caused by her cats. Pt c/o pain to R knee. EMS report pt did hit her head. Denies LOC or anticoagulants. A/Ox4 on arrival. EMS report pt ambulated after fall.  EMS Vitals  164/90 HR 70 SPO2 98% on R/A

## 2022-03-22 ENCOUNTER — Ambulatory Visit: Payer: Medicare Other | Admitting: Neurology

## 2022-03-24 ENCOUNTER — Ambulatory Visit: Payer: Medicare Other | Admitting: Primary Care

## 2022-04-03 ENCOUNTER — Other Ambulatory Visit: Payer: Self-pay | Admitting: Primary Care

## 2022-04-03 DIAGNOSIS — F341 Dysthymic disorder: Secondary | ICD-10-CM

## 2022-04-15 ENCOUNTER — Encounter: Payer: Self-pay | Admitting: Primary Care

## 2022-04-15 ENCOUNTER — Ambulatory Visit (INDEPENDENT_AMBULATORY_CARE_PROVIDER_SITE_OTHER): Payer: Medicare Other | Admitting: Primary Care

## 2022-04-15 VITALS — BP 150/92 | HR 76 | Temp 97.7°F | Ht 61.5 in | Wt 169.0 lb

## 2022-04-15 DIAGNOSIS — E1165 Type 2 diabetes mellitus with hyperglycemia: Secondary | ICD-10-CM | POA: Diagnosis not present

## 2022-04-15 DIAGNOSIS — Z23 Encounter for immunization: Secondary | ICD-10-CM | POA: Diagnosis not present

## 2022-04-15 LAB — POCT GLYCOSYLATED HEMOGLOBIN (HGB A1C): Hemoglobin A1C: 8.2 % — AB (ref 4.0–5.6)

## 2022-04-15 MED ORDER — METFORMIN HCL ER 500 MG PO TB24: 500.0000 mg | ORAL_TABLET | Freq: Every day | ORAL | 1 refills | Status: DC

## 2022-04-15 MED ORDER — BLOOD GLUCOSE MONITOR KIT: PACK | 0 refills | Status: DC

## 2022-04-15 NOTE — Patient Instructions (Signed)
Start Metformin with breakfast daily. Let us know if you have any adverse effects.   Check your blood sugar once a daily. Best time to check your blood sugar is first thing in the morning or an hour before you eat or two hours after you eat or at bed time.   We will see you in three months for a follow up.

## 2022-04-15 NOTE — Assessment & Plan Note (Addendum)
Uncontrolled. A1C 8.2 today.  Will start Metformin XR 500 mg once daily. Recommended checking blood sugars at home. Will send in a prescription for a glucometer.  Discussed to work on diet and regular exercise.  Follow up in three months.  I evaluated patient, was consulted regarding treatment, and agree with assessment and plan per Tinnie Gens, RN, DNP student.   Allie Bossier, NP-C

## 2022-04-15 NOTE — Progress Notes (Signed)
Established Patient Office Visit  Subjective   Patient ID: [name], female    DOB: 02-Apr-1946  Age: 76 y.o. MRN: [id number]  Chief Complaint  Patient presents with   Follow-up    3 month follow up for diabetes     HPI  [name] is a 76 year old female with past medical history of hypertension, GERD, diabetes mellitus type 2, vascular parkinsonism, osteoporosis and hyperlipidemia, who presents today for diabetes follow up.   Type 2 diabetes:   Current medications include: none  She is not checking her blood glucose daily. She does not have a meter at home.  Last A1C: 7.6 on Dec 15, 2021. 8.2 today.  Last Eye Exam: Completed Last Foot Exam: Due. 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   Cramping of hands 03/23/2017   Osteoporosis 01/31/2017   GERD (gastroesophageal reflux disease) 08/26/2015   Recurrent UTI 05/27/2015   Type 2 diabetes mellitus with hyperglycemia (HCC) 12/03/2014   DNR (do not resuscitate) 11/23/2012   Dysuria 02/07/2012   Dysphagia 12/07/2011   Orthostasis 12/07/2011   Bell's palsy    Hypersomnia 01/18/2011   ANXIETY DEPRESSION 04/16/2010   OBESITY, MORBID 02/16/2007   HYPERLIPIDEMIA, MIXED 01/31/2007   HYPERTENSION, BENIGN ESSENTIAL 01/31/2007   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    "can't do either"   DVT (deep venous thrombosis), left "early 2000's"   RLE   Dysmetabolic syndrome X     Essential hypertension, benign    Headache(784.0) 05/2011   "pretty often since they put me on Parkinson's medicine"   History of shingles    "as a teen and in my 55's"   Mixed hyperlipidemia    Morbid obesity (HCC)    Neurodegenerative gait disorder    Personal history of thrombophlebitis    Syncope and collapse 12/06/11   "this am; I pass out fairly often"   Type II or unspecified type diabetes mellitus without mention of complication, not stated as uncontrolled 12/06/11   "not anymore; had lap band OR"   Past Surgical History:  Procedure Laterality Date   bladder tack  11/1995   BREAST BIOPSY  05/20/2000   bilaterally   BREAST CYST ASPIRATION  12/1998   bilaterally   CARDIAC CATHETERIZATION  2004   CATARACT EXTRACTION W/ INTRAOCULAR LENS  IMPLANT, BILATERAL     FRACTURE SURGERY  02/09/2006   bilateral elbows   FRACTURE SURGERY     right knee   FRACTURE SURGERY     tib plateau   KNEE ARTHROSCOPY  12/12/2002   right    LAPAROSCOPIC GASTRIC BANDING  2008   SEPTOPLASTY  08/1998   with antral window    TONSILLECTOMY AND ADENOIDECTOMY  1970   TUBAL LIGATION  1970's   Social History   Tobacco Use   Smoking status: Never   Smokeless tobacco: Never  Vaping Use   Vaping Use: Never used  Substance Use Topics  Alcohol use: No    Comment: none   Drug use: No   Allergies  Allergen Reactions   Codeine Sulfate     REACTION: hallucinations   Penicillins Anaphylaxis, Swelling and Rash   Amoxapine And Related    Wellbutrin [Bupropion] Other (See Comments)    Seizures and hallucinations      Review of Systems  Gastrointestinal: Negative.   Genitourinary: Negative.   Skin: Negative.   Neurological: Negative.   Endo/Heme/Allergies:  Negative for polydipsia.      Objective:     BP (!) 150/92   Pulse 76   Temp 97.7 F (36.5 C) (Temporal)   Ht 5' 1.5" (1.562 m)   Wt 169 lb (76.7 kg)   SpO2 98%   BMI 31.42 kg/m  BP Readings from Last 3 Encounters:  04/15/22  (!) 150/92  03/21/22 132/64  01/15/22 128/66   Wt Readings from Last 3 Encounters:  04/15/22 169 lb (76.7 kg)  03/21/22 163 lb (73.9 kg)  01/15/22 166 lb 8 oz (75.5 kg)      Physical Exam Vitals and nursing note reviewed.  Cardiovascular:     Rate and Rhythm: Normal rate and regular rhythm.     Pulses: Normal pulses.     Heart sounds: Normal heart sounds.  Pulmonary:     Effort: Pulmonary effort is normal.     Breath sounds: Normal breath sounds.  Neurological:     Mental Status: She is alert.  Psychiatric:        Mood and Affect: Mood normal.      Results for orders placed or performed in visit on 04/15/22  POCT glycosylated hemoglobin (Hb A1C)  Result Value Ref Range   Hemoglobin A1C 8.2 (A) 4.0 - 5.6 %   HbA1c POC (<> result, manual entry)     HbA1c, POC (prediabetic range)     HbA1c, POC (controlled diabetic range)         The ASCVD Risk score (Arnett DK, et al., 2019) failed to calculate for the following reasons:   The valid total cholesterol range is 130 to 320 mg/dL    Assessment & Plan:   Problem List Items Addressed This Visit       Endocrine   Type 2 diabetes mellitus with hyperglycemia ([place]) - Primary    Uncontrolled. A1C 8.2 today.  Will start Metformin XR 500 mg once daily. Recommended checking blood sugars at home. Will send in a prescription for a glucometer.  Discussed to work on diet and regular exercise.  Follow up in three months.  I evaluated patient, was consulted regarding treatment, and agree with assessment and plan per Tinnie Gens, RN, DNP student.   Allie Bossier, NP-C       Relevant Medications   blood glucose meter kit and supplies KIT   metFORMIN (GLUCOPHAGE-XR) 500 MG 24 hr tablet   Other Relevant Orders   Microalbumin/Creatinine Ratio, Urine   POCT glycosylated hemoglobin (Hb A1C) (Completed)   Other Visit Diagnoses     Need for immunization against influenza       Relevant Orders   Flu Vaccine QUAD High  Dose(Fluad) (Completed)       Return in about 3 months (around 07/15/2022) for diabetes follow up.    Tinnie Gens, BSN-RN, DNP STUDENT

## 2022-04-15 NOTE — Progress Notes (Signed)
Subjective:    Patient ID: [name], female    DOB: 1945/10/20, 76 y.o.   MRN: [id number]  HPI  [name] is a very pleasant 76 y.o. female with a history of type 2 diabetes, vascular parkinsonism, hyperlipidemia, who presents today for follow up of diabetes.   Her daughter joins [place] today.  Current medications include: None.   She is checking her blood glucose 0 times daily.   Last A1C: 7.6 in May 2023, 8.2 today.  Last Eye Exam: UTD Last Foot Exam: Due today Pneumonia Vaccination: UTD Urine Microalbumin: Due Statin: rosuvastatin   Dietary changes since last visit: Milkshakes, eating whatever she wants. Not watching her diet.    Exercise: No regular exercise  BP Readings from Last 3 Encounters:  04/15/22 (!) 150/92  03/21/22 132/64  01/15/22 128/66     Review of Systems  Respiratory:  Negative for shortness of breath.   Cardiovascular:  Negative for chest pain.  Neurological:  Negative for dizziness and numbness.         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    "can't do either"   DVT (deep venous thrombosis), left "early 2000's"   RLE   Dysmetabolic syndrome X    Essential hypertension, benign    Headache(784.0) 05/2011   "pretty often since they put me on Parkinson's medicine"   History of shingles    "as a teen and in my 72's"   Mixed hyperlipidemia    Morbid obesity ([place])    Neurodegenerative gait disorder    Personal history of thrombophlebitis    Syncope and collapse 12/06/11   "this am; I pass out fairly often"   Type II or unspecified type diabetes mellitus without mention of complication, not stated as uncontrolled 12/06/11   "not anymore; had lap band OR"    Social History   Socioeconomic History   Marital status: Widowed    Spouse name:  Not on file   Number of children: 2   Years of education: Not on file   Highest education level: Some college, no degree  Occupational History   Occupation: Secondary school teacher: [employer]    Comment: retired, 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   Daughter is HPOA.   DNR.   Social Determinants of Health   Financial Resource Strain: Low Risk  (08/20/2021)   Overall Financial Resource Strain (CARDIA)    Difficulty of Paying Living Expenses: Not hard at all  Food Insecurity: No Food Insecurity (08/20/2021)   Hunger Vital Sign    Worried About Running Out of Food in the Last Year: Never true    Ran Out of Food in the Last Year: Never true  Transportation Needs: No Transportation Needs (08/20/2021)   PRAPARE - Hydrologist (Medical):  No    Lack of Transportation (Non-Medical): No  Physical Activity: Insufficiently Active (08/20/2021)   Exercise Vital Sign    Days of Exercise per Week: 3 days    Minutes of Exercise per Session: 20 min  Stress: No Stress Concern Present (08/20/2021)   [place]    Feeling of Stress : Not at all  Social Connections: Socially Isolated (08/20/2021)   Social Connection and Isolation Panel [NHANES]    Frequency of Communication with Friends and Family: Never    Frequency of Social Gatherings with Friends and Family: More than three times a week    Attends Religious Services: Never    Marine scientist or Organizations: No    Attends Archivist Meetings: Never    Marital Status: Widowed  Intimate Partner Violence:  Not At Risk (08/20/2021)   Humiliation, Afraid, Rape, and Kick questionnaire    Fear of Current or Ex-Partner: No    Emotionally Abused: No    Physically Abused: No    Sexually Abused: No    Past Surgical History:  Procedure Laterality Date   bladder tack  11/1995   BREAST BIOPSY  05/20/2000   bilaterally   BREAST CYST ASPIRATION  12/1998   bilaterally   CARDIAC CATHETERIZATION  2004   CATARACT EXTRACTION W/ INTRAOCULAR LENS  IMPLANT, BILATERAL     FRACTURE SURGERY  02/09/2006   bilateral elbows   FRACTURE SURGERY     right knee   FRACTURE SURGERY     tib plateau   KNEE ARTHROSCOPY  12/12/2002   right    LAPAROSCOPIC GASTRIC BANDING  2008   SEPTOPLASTY  08/1998   with antral window    TONSILLECTOMY AND ADENOIDECTOMY  1970   TUBAL LIGATION  1's    Family History  Problem Relation Age of Onset   Asthma Mother    COPD Father    Heart disease Father    Diabetes Sister    Vision loss Sister        legally blind   Hypertension Child    Hyperlipidemia Child    Diabetes Child    Diabetes Son    Breast cancer Neg Hx     Allergies  Allergen Reactions   Codeine Sulfate     REACTION: hallucinations   Penicillins Anaphylaxis, Swelling and Rash   Amoxapine And Related    Wellbutrin [Bupropion] Other (See Comments)    Seizures and hallucinations    Current Outpatient Medications on File Prior to Visit  Medication Sig Dispense Refill   aspirin 81 MG tablet Take 81 mg by mouth daily.     Bioflavonoid Products (GRAPE SEED PO) Take 1 tablet by mouth daily.      calcium carbonate (TUMS EX) 750 MG chewable tablet Chew 1 tablet by mouth as needed.      Calcium Carbonate-Vitamin D (CALCIUM + D PO) Take 600 mg by mouth daily.     carbidopa-levodopa (SINEMET IR) 25-100 MG tablet TAKE 3 TABLETS BY MOUTH 3 TIMES DAILY. 810 tablet 0   cetirizine (ZYRTEC) 10 MG tablet Take 10 mg by mouth daily.     Cholecalciferol (VITAMIN D3) 1000 UNITS CAPS Take 1 capsule by mouth daily.      clonazePAM (KLONOPIN) 0.5 MG tablet TAKE 1/2 TABLET BY MOUTH AT BEDTIME AND 1/2 DAILY AS NEEDED 90 tablet 0   escitalopram (LEXAPRO) 10 MG tablet TAKE 1 TABLET BY MOUTH EVERY DAY 90 tablet 0  feeding supplement (ENSURE COMPLETE) LIQD Take 237 mLs by mouth 2 (two) times daily between meals. 1 Bottle 11   fluticasone (FLONASE) 50 MCG/ACT nasal spray Place 2 sprays into both nostrils daily.     gabapentin (NEURONTIN) 100 MG capsule TAKE 1 CAPSULE BY MOUTH THREE TIMES A DAY 270 capsule 0   losartan-hydrochlorothiazide (HYZAAR) 50-12.5 MG tablet TAKE 1 TABLET BY MOUTH EVERY DAY FOR BLOOD PRESSURE 90 tablet 3   mirtazapine (REMERON) 30 MG tablet TAKE 1 TABLET (30 MG TOTAL) BY MOUTH AT BEDTIME. FOR SLEEP. 90 tablet 3   montelukast (SINGULAIR) 10 MG tablet TAKE 1 TABLET (10 MG TOTAL) BY MOUTH AT BEDTIME. FOR ALLERGIES 90 tablet 3   Multiple Vitamins-Minerals (CENTRUM SILVER ADULT 50+) TABS Take by mouth.     naproxen sodium (ALEVE) 220 MG tablet Take 220 mg by mouth in the morning and at bedtime.     rosuvastatin (CRESTOR) 5 MG tablet TAKE 1 TABLET (5 MG TOTAL) BY MOUTH EVERY EVENING. FOR CHOLESTEROL. 90 tablet 3   vitamin B-12 (CYANOCOBALAMIN) 1000 MCG tablet Take 1,000 mcg by mouth daily.     [DISCONTINUED] simvastatin (ZOCOR) 20 MG tablet TAKE 1 TABLET BY MOUTH EVERY DAY AT BEDTIME FOR CHOLESTEROL 90 tablet 0   No current facility-administered medications on file prior to visit.    BP (!) 150/92   Pulse 76   Temp 97.7 F (36.5 C) (Temporal)   Ht 5' 1.5" (1.562 m)   Wt 169 lb (76.7 kg)   SpO2 98%   BMI 31.42 kg/m  Objective:   Physical Exam Cardiovascular:     Rate and Rhythm: Normal rate and regular rhythm.  Pulmonary:     Effort: Pulmonary effort is normal.     Breath sounds: Normal breath sounds.  Musculoskeletal:     Cervical back: Neck supple.  Skin:    General: Skin is warm and dry.           Assessment & Plan:   Problem List Items Addressed This Visit        Endocrine   Type 2 diabetes mellitus with hyperglycemia ([place]) - Primary    Uncontrolled. A1C 8.2 today.  Will start Metformin XR 500 mg once daily. Recommended checking blood sugars at home. Will send in a prescription for a glucometer.  Discussed to work on diet and regular exercise.  Follow up in three months.  I evaluated patient, was consulted regarding treatment, and agree with assessment and plan per Tinnie Gens, RN, DNP student.   Allie Bossier, NP-C       Relevant Medications   blood glucose meter kit and supplies KIT   metFORMIN (GLUCOPHAGE-XR) 500 MG 24 hr tablet   Other Relevant Orders   Microalbumin/Creatinine Ratio, Urine   POCT glycosylated hemoglobin (Hb A1C) (Completed)   Other Visit Diagnoses     Need for immunization against influenza       Relevant Orders   Flu Vaccine QUAD High Dose(Fluad) (Completed)          Pleas Koch, NP

## 2022-05-03 ENCOUNTER — Other Ambulatory Visit: Payer: Self-pay | Admitting: Primary Care

## 2022-05-03 DIAGNOSIS — F341 Dysthymic disorder: Secondary | ICD-10-CM

## 2022-05-17 ENCOUNTER — Ambulatory Visit
Admission: RE | Admit: 2022-05-17 | Discharge: 2022-05-17 | Disposition: A | Discharge status: Home / Self Care | Payer: Medicare Other | Source: Ambulatory Visit | Attending: Primary Care | Admitting: Primary Care

## 2022-05-17 DIAGNOSIS — Z1231 Encounter for screening mammogram for malignant neoplasm of breast: Secondary | ICD-10-CM | POA: Diagnosis not present

## 2022-05-21 NOTE — Progress Notes (Deleted)
Assessment/Plan:   1.  Parkinsons Disease             -Continue carbidopa/levodopa 25/100, 3 tablets 3 times per day   2.  Depression             -On Lexapro             -On mirtazapine             -Managed by PCP   3.  RBD and GAD             -On clonazepam 0.5 mg, half tablet at bedtime and will take half tablet as needed in the day for anxiety.             -PDMP reviewed.  No red flags.   4.  Chronic headache             -On gabapentin, 100 mg 3 times per day.             -Saw Dr. Tommi Rumps in the remote past.   5.  MCI             -Patient last had neurocognitive testing in August, 2021 with Dr. Nicole Kindred.   6.  DM              -Metformin was just started on September 21.  Her A1c has been climbing.   Subjective:   [name] was seen today in follow up for Parkinsons disease.  My previous records were reviewed prior to todays visit as well as outside records available to me.  Pt was in the emergency room August 27 after a fall.  She got her foot tangled in a cord, causing her to fall and hitting her knee and hitting her head.  She was evaluated in the emergency room.  Notes are reviewed.  CT brain was done and was nonacute.  She was discharged home.  She actually had an appointment with me the next day, but she did not feel well enough to come and and appointment was rescheduled to today.  Pt denies lightheadedness, near syncope.  No hallucinations.  Mood has been good.  Remains on low-dose clonazepam for RBD and generalized anxiety disorder.  Last filled May 25.  PDMP reviewed.  No red flags.  Labs from primary care reviewed.  Her blood sugars and A1c have been consistently climbing.  Her A1c was 8.2 last, and she was now started on metformin on September 21.  Current prescribed movement disorder medications:  Carbidopa/levodopa 25/100, 3 tablets 3 times per day Mirtazapine, 30 mg at night Gabapentin, 100 mg 3 times per day Clonazepam, 0.25 mg twice daily     ALLERGIES:   Allergies  Allergen Reactions   Codeine Sulfate     REACTION: hallucinations   Penicillins Anaphylaxis, Swelling and Rash   Amoxapine And Related    Wellbutrin [Bupropion] Other (See Comments)    Seizures and hallucinations    CURRENT MEDICATIONS:  Outpatient Encounter Medications as of 05/25/2022  Medication Sig   aspirin 81 MG tablet Take 81 mg by mouth daily.   Bioflavonoid Products (GRAPE SEED PO) Take 1 tablet by mouth daily.    blood glucose meter kit and supplies KIT Dispense based on patient and insurance preference. Use up to four times daily as directed. (FOR ICD-9 250.00, 250.01).   calcium carbonate (TUMS EX) 750 MG chewable tablet Chew 1 tablet by mouth as needed.    Calcium Carbonate-Vitamin D (CALCIUM +  D PO) Take 600 mg by mouth daily.   carbidopa-levodopa (SINEMET IR) 25-100 MG tablet TAKE 3 TABLETS BY MOUTH 3 TIMES DAILY.   cetirizine (ZYRTEC) 10 MG tablet Take 10 mg by mouth daily.   Cholecalciferol (VITAMIN D3) 1000 UNITS CAPS Take 1 capsule by mouth daily.   clonazePAM (KLONOPIN) 0.5 MG tablet TAKE 1/2 TABLET BY MOUTH AT BEDTIME AND 1/2 DAILY AS NEEDED   escitalopram (LEXAPRO) 10 MG tablet TAKE 1 TABLET BY MOUTH EVERY DAY   feeding supplement (ENSURE COMPLETE) LIQD Take 237 mLs by mouth 2 (two) times daily between meals.   fluticasone (FLONASE) 50 MCG/ACT nasal spray Place 2 sprays into both nostrils daily.   gabapentin (NEURONTIN) 100 MG capsule TAKE 1 CAPSULE BY MOUTH THREE TIMES A DAY   losartan-hydrochlorothiazide (HYZAAR) 50-12.5 MG tablet TAKE 1 TABLET BY MOUTH EVERY DAY FOR BLOOD PRESSURE   metFORMIN (GLUCOPHAGE-XR) 500 MG 24 hr tablet Take 1 tablet (500 mg total) by mouth daily with breakfast. for diabetes.   mirtazapine (REMERON) 30 MG tablet TAKE 1 TABLET (30 MG TOTAL) BY MOUTH AT BEDTIME. FOR SLEEP.   montelukast (SINGULAIR) 10 MG tablet TAKE 1 TABLET (10 MG TOTAL) BY MOUTH AT BEDTIME. FOR ALLERGIES   Multiple Vitamins-Minerals (CENTRUM  SILVER ADULT 50+) TABS Take by mouth.   naproxen sodium (ALEVE) 220 MG tablet Take 220 mg by mouth in the morning and at bedtime.   rosuvastatin (CRESTOR) 5 MG tablet TAKE 1 TABLET (5 MG TOTAL) BY MOUTH EVERY EVENING. FOR CHOLESTEROL.   vitamin B-12 (CYANOCOBALAMIN) 1000 MCG tablet Take 1,000 mcg by mouth daily.   [DISCONTINUED] simvastatin (ZOCOR) 20 MG tablet TAKE 1 TABLET BY MOUTH EVERY DAY AT BEDTIME FOR CHOLESTEROL   No facility-administered encounter medications on file as of 05/25/2022.    Objective:   PHYSICAL EXAMINATION:    VITALS:   There were no vitals filed for this visit.   GEN:  The patient appears stated age and is in NAD. HEENT:  Normocephalic, atraumatic.  The mucous membranes are moist. The superficial temporal arteries are without ropiness or tenderness.   Neurological examination:  Orientation: The patient is alert and oriented x3. Cranial nerves: There is good facial symmetry with minimal facial hypomimia. The speech is fluent and clear. Soft palate rises symmetrically and there is no tongue deviation. Hearing is intact to conversational tone. Sensation: Sensation is intact to light touch throughout Motor: Strength is at least antigravity x4.  Movement examination: Tone: There is nl tone in the UE/LE Abnormal movements: none Coordination:  There is minimal decremation with RAM's, with finger taps bilaterally Gait and Station: Patient pushes off of the chair to arise. The patient's stride length is slow and tenuous.    I have reviewed and interpreted the following labs independently    Chemistry      Component Value Date/Time   NA 142 12/15/2021 0842   K 4.1 12/15/2021 0842   CL 100 12/15/2021 0842   CO2 34 (H) 12/15/2021 0842   BUN 19 12/15/2021 0842   CREATININE 0.65 12/15/2021 0842   CREATININE 0.71 12/14/2019 1504      Component Value Date/Time   CALCIUM 10.2 12/15/2021 0842   ALKPHOS 63 12/15/2021 0842   AST 11 12/15/2021 0842   ALT 2  12/15/2021 0842   BILITOT 0.5 12/15/2021 0842       Lab Results  Component Value Date   WBC 7.9 12/15/2021   HGB 13.4 12/15/2021   HCT 40.2 12/15/2021  MCV 94.5 12/15/2021   PLT 210.0 12/15/2021    Lab Results  Component Value Date   TSH 1.04 12/07/2017     Total time spent on today's visit was *** minutes, including both face-to-face time and nonface-to-face time.  Time included that spent on review of records (prior notes available to me/labs/imaging if pertinent), discussing treatment and goals, answering patient's questions and coordinating care.  Cc:  Pleas Koch, NP

## 2022-05-24 NOTE — Progress Notes (Unsigned)
Assessment/Plan:   1.  Parkinsons Disease             -Continue carbidopa/levodopa 25/100, 3 tablets 3 times per day  -discussed PT.  She needs it but declined.     2.  Depression             -On Lexapro             -On mirtazapine             -Managed by PCP   3.  RBD and GAD             -On clonazepam 0.5 mg, half tablet at bedtime and will take half tablet as needed in the day for anxiety.             -PDMP reviewed.  No red flags.   4.  Chronic headache             -On gabapentin, 100 mg 3 times per day.             -Saw Dr. Tommi Rumps in the remote past.   5.  MCI             -Patient last had neurocognitive testing in August, 2021 with Dr. Nicole Kindred.  -daughter thinks may have progressed some.  They asked about prevagen and it is not recommended.  We discussed donepezil and we will start 5 mg daily.  R/b/se.     6.  DM              -Metformin was just started on September 21.  Her A1c has been climbing.   Subjective:   [name] was seen today in follow up for Parkinsons disease.  My previous records were reviewed prior to todays visit as well as outside records available to me.  Pt was in the emergency room August 27 after a fall.  She got her foot tangled in a cord, causing her to fall and hitting her knee and hitting her head.  She was evaluated in the emergency room.  Notes are reviewed.  CT brain was done and was nonacute.  She was discharged home.  She actually had an appointment with me the next day, but she did not feel well enough to come and and appointment was rescheduled to today.  Daughter states that she has had some other "slides" off the bed.  Pt denies lightheadedness, near syncope.  No hallucinations.  daughter c/o memory issues. she will have trouble with day of the week and recognizing people in pics.  She is still able to do pill container; does that daily.  Remains on low-dose clonazepam for RBD and generalized anxiety disorder.  Last filled May 25.   PDMP reviewed.  No red flags.  Labs from primary care reviewed.  Her blood sugars and A1c have been consistently climbing.  Her A1c was 8.2 last, and she was now started on metformin on September 21.  She is tolerating it well.  She is walking on the weekend for exercise but otherwise no exercise.  Current prescribed movement disorder medications:  Carbidopa/levodopa 25/100, 3 tablets 3 times per day Mirtazapine, 30 mg at night Gabapentin, 100 mg 3 times per day Clonazepam, 0.25 mg twice daily    ALLERGIES:   Allergies  Allergen Reactions   Codeine Sulfate     REACTION: hallucinations   Penicillins Anaphylaxis, Swelling and Rash   Amoxapine And Related    Wellbutrin [Bupropion] Other (  See Comments)    Seizures and hallucinations    CURRENT MEDICATIONS:  Outpatient Encounter Medications as of 05/26/2022  Medication Sig   aspirin 81 MG tablet Take 81 mg by mouth daily.   Bioflavonoid Products (GRAPE SEED PO) Take 1 tablet by mouth daily.    blood glucose meter kit and supplies KIT Dispense based on patient and insurance preference. Use up to four times daily as directed. (FOR ICD-9 250.00, 250.01).   calcium carbonate (TUMS EX) 750 MG chewable tablet Chew 1 tablet by mouth as needed.    Calcium Carbonate-Vitamin D (CALCIUM + D PO) Take 600 mg by mouth daily.   carbidopa-levodopa (SINEMET IR) 25-100 MG tablet TAKE 3 TABLETS BY MOUTH 3 TIMES DAILY.   cetirizine (ZYRTEC) 10 MG tablet Take 10 mg by mouth daily.   Cholecalciferol (VITAMIN D3) 1000 UNITS CAPS Take 1 capsule by mouth daily.   clonazePAM (KLONOPIN) 0.5 MG tablet TAKE 1/2 TABLET BY MOUTH AT BEDTIME AND 1/2 DAILY AS NEEDED   escitalopram (LEXAPRO) 10 MG tablet TAKE 1 TABLET BY MOUTH EVERY DAY   feeding supplement (ENSURE COMPLETE) LIQD Take 237 mLs by mouth 2 (two) times daily between meals.   fluticasone (FLONASE) 50 MCG/ACT nasal spray Place 2 sprays into both nostrils daily.   gabapentin (NEURONTIN) 100 MG capsule TAKE 1  CAPSULE BY MOUTH THREE TIMES A DAY   losartan-hydrochlorothiazide (HYZAAR) 50-12.5 MG tablet TAKE 1 TABLET BY MOUTH EVERY DAY FOR BLOOD PRESSURE   metFORMIN (GLUCOPHAGE-XR) 500 MG 24 hr tablet Take 1 tablet (500 mg total) by mouth daily with breakfast. for diabetes.   mirtazapine (REMERON) 30 MG tablet TAKE 1 TABLET (30 MG TOTAL) BY MOUTH AT BEDTIME. FOR SLEEP.   montelukast (SINGULAIR) 10 MG tablet TAKE 1 TABLET (10 MG TOTAL) BY MOUTH AT BEDTIME. FOR ALLERGIES   Multiple Vitamins-Minerals (CENTRUM SILVER ADULT 50+) TABS Take by mouth.   naproxen sodium (ALEVE) 220 MG tablet Take 220 mg by mouth in the morning and at bedtime.   rosuvastatin (CRESTOR) 5 MG tablet TAKE 1 TABLET (5 MG TOTAL) BY MOUTH EVERY EVENING. FOR CHOLESTEROL.   vitamin B-12 (CYANOCOBALAMIN) 1000 MCG tablet Take 1,000 mcg by mouth daily.   [DISCONTINUED] simvastatin (ZOCOR) 20 MG tablet TAKE 1 TABLET BY MOUTH EVERY DAY AT BEDTIME FOR CHOLESTEROL   No facility-administered encounter medications on file as of 05/26/2022.    Objective:   PHYSICAL EXAMINATION:    VITALS:   Vitals:   05/26/22 0811  BP: 134/82  Pulse: 73  SpO2: 96%  Weight: 167 lb 12.8 oz (76.1 kg)  Height: 5' 1.5" (1.562 m)     GEN:  The patient appears stated age and is in NAD. HEENT:  Normocephalic, atraumatic.  The mucous membranes are moist. The superficial temporal arteries are without ropiness or tenderness.   Neurological examination:  Orientation: The patient is alert and oriented x3. Cranial nerves: There is good facial symmetry with minimal facial hypomimia. The speech is fluent and clear. Soft palate rises symmetrically and there is no tongue deviation. Hearing is decreased  to conversational tone. Sensation: Sensation is intact to light touch throughout Motor: Strength is at least antigravity x4.  Movement examination: Tone: There is nl tone in the UE/LE Abnormal movements: none Coordination:  There is minimal decremation with  RAM's, with finger taps bilaterally Gait and Station: Patient pushes off of the chair to arise. The patient's stride length is slow and tenuous and a bit unstable (she attributes to the R  knee)  I have reviewed and interpreted the following labs independently    Chemistry      Component Value Date/Time   NA 142 12/15/2021 0842   K 4.1 12/15/2021 0842   CL 100 12/15/2021 0842   CO2 34 (H) 12/15/2021 0842   BUN 19 12/15/2021 0842   CREATININE 0.65 12/15/2021 0842   CREATININE 0.71 12/14/2019 1504      Component Value Date/Time   CALCIUM 10.2 12/15/2021 0842   ALKPHOS 63 12/15/2021 0842   AST 11 12/15/2021 0842   ALT 2 12/15/2021 0842   BILITOT 0.5 12/15/2021 0842       Lab Results  Component Value Date   WBC 7.9 12/15/2021   HGB 13.4 12/15/2021   HCT 40.2 12/15/2021   MCV 94.5 12/15/2021   PLT 210.0 12/15/2021    Lab Results  Component Value Date   TSH 1.04 12/07/2017     Total time spent on today's visit was 22 minutes, including both face-to-face time and nonface-to-face time.  Time included that spent on review of records (prior notes available to me/labs/imaging if pertinent), discussing treatment and goals, answering patient's questions and coordinating care.  Cc:  Pleas Koch, NP

## 2022-05-25 ENCOUNTER — Ambulatory Visit: Payer: Medicare Other | Admitting: Neurology

## 2022-05-26 ENCOUNTER — Encounter: Payer: Self-pay | Admitting: Neurology

## 2022-05-26 ENCOUNTER — Ambulatory Visit (INDEPENDENT_AMBULATORY_CARE_PROVIDER_SITE_OTHER): Payer: Medicare Other | Admitting: Neurology

## 2022-05-26 VITALS — BP 134/82 | HR 73 | Ht 61.5 in | Wt 167.8 lb

## 2022-05-26 DIAGNOSIS — R413 Other amnesia: Secondary | ICD-10-CM | POA: Diagnosis not present

## 2022-05-26 DIAGNOSIS — G20A1 Parkinson's disease without dyskinesia, without mention of fluctuations: Secondary | ICD-10-CM

## 2022-05-26 MED ORDER — DONEPEZIL HCL 5 MG PO TABS: 5.0000 mg | ORAL_TABLET | Freq: Every day | ORAL | 1 refills | Status: DC

## 2022-05-26 NOTE — Patient Instructions (Signed)
Start donepezil, 5 mg at bedtime  I think that you need PT.  Let me know if you change your mind.  Local and Online Resources for Power over Parkinson's Group  October 2023    LOCAL [place] PARKINSON'S GROUPS   Power over Parkinson's Group:    Power Over Parkinson's Patient Education Group will be Wednesday, October 11th-*Hybrid meting*- in person at [facility] location and via [facility] at 2 pm.   Upcoming Power over [employer] Meetings:  2nd Wednesdays of the month at 2 pm:   October 11th, November 8th, December 13th  Contact Amy Marriott at [email or web address] if interested in participating in this group    [place]! Moves [employer] Instructor-Led Classes offering at [employer]!  TUESDAYS and Wednesdays 1-2 pm.   Contact Vonna Kotyk at  [email or web address] or Caron Presume at [place], [email or web address]  Dance for Parkinson 's classes will be on Tuesdays 9:30am-10:30am starting October 3-December 12 with a break the week of November 21st. Located in the [employer]  which is in the first floor of the [employer] ([place] To register:  [email or web address] or [phone]  Drumming for Parkinson's will be held on 2nd and 4th Mondays at 11:00 am.   Located at the [place]  [place] at [email or web address] or [phone]  Through support from the [place] for Parkinson's classes are free for both patients and caregivers.    [facility] [employer] Parkinson's Tai Chi Class, Mondays at 11 am.  Call [phone] for details   [place]:  [email or web address]  PD Health at Home continues:  Mindfulness Mondays, Wellness Wednesdays, Fitness Fridays   Upcoming Education:    Parkinson's 101:  What you and your family should  know.  Wednesday, Oct. 4th 1-2 pm  Expert Briefing:     Parkinson's and the Gut-Brain Connection.  Wednesday, Oct. 11th 1-2 pm  Hallucinations and Delusions in Parkinson's.  Wednesday, Nov. 8th, 1-2 pm  Register for expert briefings (webinars) at [email or web address]  Please check out their website to sign up for emails and see their full online offerings      [place]:  [email or web address]   Third Thursday Webinars:  On the third Thursday of every month at 12 p.m. ET, join our free live webinars to learn about various aspects of living with Parkinson's disease and our work to speed medical breakthroughs.  Upcoming Webinar:  Surveyor, mining for [employer]. (Replay).  Thursday, Oct. 12th at 12 noon  Check out additional information on their website to see their full online offerings    [place]:  [email or web address]  Upcoming Webinar:   Stay tuned  Webinar Series:  Living with Parkinson's Meetup.   Third Thursdays each month, 3 pm  Care Partner Monthly Meetup.  With Robin Searing Phinney.  First Tuesday of each month, 2 pm  Check out additional information to Live Well Today on their website    Parkinson and Movement Disorders (PMD) Alliance:  www.[email or web address]  [employer] Online:  Online Education Events  Sign up for emails, which are sent weekly to give you updates on programming and online offerings    Parkinson's Association of the [place]:  www.[email or web address]  Information on online support groups, education events, and online exercises including Yoga, Parkinson's exercises and more-LOTS of information on links to PD resources and  online events  Virtual Support Group through [employer] of the [place]; next one is scheduled for Wednesday, October 4th at 2 pm.  (These are typically scheduled for the 1st Wednesday of the  month at 2 pm).  Visit website for details.   MOVEMENT AND EXERCISE OPPORTUNITIES  PWR! Moves Classes at [place].  Wednesdays 10 and 11 am.   Contact Amy Marriott, PT [email or web address] if interested.  NEW PWR! Moves Class offerings at [employer].  *TUESDAYS* and Wednesdays 1-2 pm.  Contact Vonna Kotyk at  [email or web address] or Caron Presume at [place],  [email or web address]  Parkinson's Wellness Recovery (PWR! Moves)  www.[email or web address]  Info on the PWR! Virtual Experience:  You will have access to our expertise?through self-assessment, guided plans that start with the PD-specific fundamentals, educational content, tips, Q&A with an expert, and a growing Art therapist of PD-specific pre-recorded and live exercise classes of varying types and intensity - both physical and cognitive! If that is not enough, we offer 1:1 wellness consultations (in-person or virtual) to personalize your PWR! Research scientist (medical).   [place] Fridays:   As part of the PD Health @ Home program, this free video series focuses each week on one aspect of fitness designed to support people living with Parkinson's.? These weekly videos highlight the [place] fitness guidelines for people with Parkinson's disease.  [email or web address]  Dance for PD website is offering free, live-stream classes throughout the week, as well as links to [employer] of classes:  [email or web address]  Virtual dance and Pilates for Parkinson's classes: Click on the Community Tab> Parkinson's Movement Initiative Tab.  To register for classes and for more information, visit [email or web address] and click the "community" tab.   [employer] Parkinson's Cycling Classes   [facility]:  Thursdays @ Noon-Live classes at [employer] ([facility] [email or web address]?or [phone])  [facility]:  Virtual Classes Mondays and Thursdays Jeanette Caprice classes Tuesday, Wednesday and Thursday (contact [place] at [email or web address] ?or [phone])  [place]  Varied levels of classes are offered Tuesdays and Thursdays at [employer].   Stretching with Verdis Frederickson weekly class is also offered for people with Parkinson's  To observe a class or for more information, call [phone] or email Hezzie Bump at [email or web address]   ADDITIONAL SUPPORT AND RESOURCES  Well-Spring [employer]:Online Caregiver Education Opportunities:  www.well-springsolutions.org/caregiver-education/caregiver-support-group.  You may also contact Vickki Muff at [email or web address] or [phone].     Well-Spring Navigator:  Just1Navigator program, a?free service to help individuals and families through the journey of determining care for older adults.  The "Navigator" is a Education officer, museum, Arnell Asal, who will speak with a prospective client and/or loved ones to provide an assessment of the situation and a set of recommendations for a personalized care plan -- all free of charge, and whether?Well-Spring Solutions offers the needed service or not. If the need is not a service we provide, we are well-connected with reputable programs in town that we can refer you to.  www.well-springsolutions.org or to speak with the Navigator, call [phone].

## 2022-06-10 ENCOUNTER — Other Ambulatory Visit: Payer: Self-pay | Admitting: Primary Care

## 2022-06-10 ENCOUNTER — Other Ambulatory Visit: Payer: Self-pay | Admitting: Neurology

## 2022-06-10 DIAGNOSIS — G20A1 Parkinson's disease without dyskinesia, without mention of fluctuations: Secondary | ICD-10-CM

## 2022-06-10 DIAGNOSIS — F341 Dysthymic disorder: Secondary | ICD-10-CM

## 2022-06-10 DIAGNOSIS — F418 Other specified anxiety disorders: Secondary | ICD-10-CM

## 2022-06-10 DIAGNOSIS — E1165 Type 2 diabetes mellitus with hyperglycemia: Secondary | ICD-10-CM

## 2022-06-27 ENCOUNTER — Other Ambulatory Visit: Payer: Self-pay | Admitting: Primary Care

## 2022-06-27 ENCOUNTER — Other Ambulatory Visit: Payer: Self-pay | Admitting: Neurology

## 2022-06-27 DIAGNOSIS — F418 Other specified anxiety disorders: Secondary | ICD-10-CM

## 2022-06-27 DIAGNOSIS — F341 Dysthymic disorder: Secondary | ICD-10-CM

## 2022-06-27 DIAGNOSIS — G20A1 Parkinson's disease without dyskinesia, without mention of fluctuations: Secondary | ICD-10-CM

## 2022-06-29 ENCOUNTER — Telehealth: Payer: Medicare Other

## 2022-06-29 ENCOUNTER — Other Ambulatory Visit (HOSPITAL_COMMUNITY): Payer: Self-pay

## 2022-06-29 NOTE — Progress Notes (Signed)
[facility] Quality Team Note  Name: KENNI NEWTON Date of Birth: [birth year] MRN: [id number] Date: 06/29/2022  [facility] Quality Team has reviewed this patient's chart, please see recommendations below:  [facility] Quality Other; (KED: [place] MICROALBUMIN/CREATININE RATIO COMPLETED FOR GAP CLOSURE. TEST HAS BEEN ORDERED, PATIENT HAS UPCOMING APPT WITH PCP 07/15/2022)

## 2022-06-29 NOTE — Telephone Encounter (Signed)
Pharmacy Patient Advocate Encounter  Prolia due 06/28/22  Insurance verification completed.    The patient is insured through [employer] for: Prolia.  Pharmacy benefit copay: $300.00  Prolia Benefit verification submitted.

## 2022-07-05 ENCOUNTER — Ambulatory Visit (INDEPENDENT_AMBULATORY_CARE_PROVIDER_SITE_OTHER): Payer: Medicare Other | Admitting: Primary Care

## 2022-07-05 ENCOUNTER — Encounter: Payer: Self-pay | Admitting: Primary Care

## 2022-07-05 VITALS — BP 162/90 | HR 90 | Temp 97.0°F | Ht 61.5 in | Wt 162.0 lb

## 2022-07-05 DIAGNOSIS — R519 Headache, unspecified: Secondary | ICD-10-CM

## 2022-07-05 DIAGNOSIS — R300 Dysuria: Secondary | ICD-10-CM

## 2022-07-05 DIAGNOSIS — S0993XA Unspecified injury of face, initial encounter: Secondary | ICD-10-CM | POA: Diagnosis not present

## 2022-07-05 HISTORY — DX: Headache, unspecified: R51.9

## 2022-07-05 LAB — POC URINALSYSI DIPSTICK (AUTOMATED)
Bilirubin, UA: NEGATIVE
Blood, UA: NEGATIVE
Glucose, UA: POSITIVE — AB
Ketones, UA: POSITIVE
Leukocytes, UA: NEGATIVE
Nitrite, UA: NEGATIVE
Protein, UA: POSITIVE — AB
Spec Grav, UA: 1.025 (ref 1.010–1.025)
Urobilinogen, UA: 0.2 E.U./dL
pH, UA: 6 (ref 5.0–8.0)

## 2022-07-05 NOTE — Assessment & Plan Note (Signed)
Posttraumatic fall. Fortunately, exam today is stable.  Given her tenderness on exam, coupled with increased headaches, we will obtain imaging. She does have a history of dementia and is not the best historian.  CT head and maxillofacial scans ordered and pending. Strict return/ED precautions provided.

## 2022-07-05 NOTE — Progress Notes (Signed)
Subjective:    Patient ID: [name], female    DOB: Feb 23, 1946, 76 y.o.   MRN: [id number]  Dysuria  Pertinent negatives include no hematuria.    [name] is a very pleasant 76 y.o. female with a history of type 2 diabetes, vascular parkinsonism, hypertension, urinary urgency, dysuria, acute cystitis culture positive, dysphagia who presents today to discuss dysuria and eye pain.  Her daughter joins [place] who is helping to provide information for HPI.  1) Dysuria: Symptom onset 1 week ago with dysuria.  She did developed a foul smell to her urine and increased urinary incontinence.  She is leaking through 2-3 depends diapers daily which is more than usual.   She denies hematuria, abdominal pain. She has not taken anything OTC for her symptoms. She doesn't drink many liquids, she will drink 16 ounces of liquid in two days on average. She will drink either Diet [facility] or sugar free tea on average.   2) Eye Pain/Headaches: Acute to the right upper eye and inner corner of her eye that began after rolling out of bed on Thanksgiving Day. She accidentally rolled out of bed and hit her head and right eye onto the base of the lamp. Her eye became bruised and swollen which has improved. Her daughter has noticed her reporting increased headaches since the fall. Her memory has been gradually declining, daughter denies abrupt changes in cognition.   BP Readings from Last 3 Encounters:  07/05/22 (!) 162/90  05/26/22 134/82  04/15/22 (!) 150/92     Review of Systems  Genitourinary:  Positive for dysuria. Negative for hematuria.       Increased urinary incontinence  Skin:  Positive for color change.  Neurological:  Positive for headaches. Negative for dizziness.         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    "can't do either"   DVT (deep venous thrombosis), left "early 2000's"   RLE   Dysmetabolic syndrome X    Essential hypertension, benign    Headache(784.0) 05/2011   "pretty often since they put me on Parkinson's medicine"   History of shingles    "as a teen and in my 46's"   Mixed hyperlipidemia    Morbid obesity ([place])    Neurodegenerative gait disorder    Personal history of thrombophlebitis    Syncope and collapse 12/06/11   "this am; I pass out fairly often"   Type II or unspecified type diabetes mellitus without mention of complication, not stated as uncontrolled 12/06/11   "not anymore; had lap band OR"    Social History   Socioeconomic History   Marital status: Widowed    Spouse name: Not on file   Number of children: 2   Years of education: Not on file   Highest education level: Some college, no degree  Occupational History   Occupation: Secondary school teacher: [employer]    Comment: retired, 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   Daughter is HPOA.   DNR.   Social Determinants of Health   Financial Resource Strain: Low Risk  (08/20/2021)   Overall Financial Resource Strain (CARDIA)    Difficulty of Paying Living Expenses: Not hard at all  Food Insecurity: No Food Insecurity (08/20/2021)   Hunger Vital Sign    Worried About Running Out of Food in the Last Year: Never true    Ran Out of Food in the Last Year: Never true  Transportation Needs: No Transportation Needs (08/20/2021)   PRAPARE - Hydrologist (Medical): No    Lack of Transportation (Non-Medical): No  Physical Activity:  Insufficiently Active (08/20/2021)   Exercise Vital Sign    Days of Exercise per Week: 3 days    Minutes of Exercise per Session: 20 min  Stress: No Stress Concern Present (08/20/2021)   [place]    Feeling of Stress : Not at all  Social Connections: Socially Isolated (08/20/2021)   Social Connection and Isolation Panel [NHANES]    Frequency of Communication with Friends and Family: Never    Frequency of Social Gatherings with Friends and Family: More than three times a week    Attends Religious Services: Never    Marine scientist or Organizations: No    Attends Archivist Meetings: Never    Marital Status: Widowed  Intimate Partner Violence: Not At Risk (08/20/2021)   Humiliation, Afraid, Rape, and Kick questionnaire    Fear of Current or Ex-Partner: No    Emotionally Abused: No    Physically Abused: No    Sexually Abused: No    Past Surgical History:  Procedure Laterality Date   bladder tack  11/1995   BREAST BIOPSY  05/20/2000   bilaterally   BREAST CYST ASPIRATION  12/1998   bilaterally   CARDIAC CATHETERIZATION  2004   CATARACT EXTRACTION W/ INTRAOCULAR LENS  IMPLANT, BILATERAL     FRACTURE SURGERY  02/09/2006   bilateral elbows   FRACTURE SURGERY     right knee   FRACTURE SURGERY     tib plateau   KNEE ARTHROSCOPY  12/12/2002   right    LAPAROSCOPIC GASTRIC BANDING  2008   SEPTOPLASTY  08/1998   with antral window    TONSILLECTOMY AND ADENOIDECTOMY  1970   TUBAL LIGATION  77's    Family History  Problem Relation Age of Onset   Asthma Mother    COPD Father    Heart disease Father    Diabetes Sister    Vision loss Sister        legally blind   Hypertension Child    Hyperlipidemia Child    Diabetes Child    Diabetes Son    Breast cancer Neg Hx     Allergies  Allergen Reactions   Codeine Sulfate     REACTION: hallucinations   Penicillins Anaphylaxis, Swelling and Rash    Amoxapine And Related    Wellbutrin [Bupropion] Other (See Comments)    Seizures and hallucinations    Current Outpatient Medications on File Prior to Visit  Medication Sig Dispense Refill   aspirin 81 MG tablet Take 81 mg by mouth daily.     Bioflavonoid Products (GRAPE SEED PO) Take 1 tablet by mouth daily.  blood glucose meter kit and supplies KIT Dispense based on patient and insurance preference. Use up to four times daily as directed. (FOR ICD-9 250.00, 250.01). 1 each 0   calcium carbonate (TUMS EX) 750 MG chewable tablet Chew 1 tablet by mouth as needed.      Calcium Carbonate-Vitamin D (CALCIUM + D PO) Take 600 mg by mouth daily.     carbidopa-levodopa (SINEMET IR) 25-100 MG tablet TAKE 3 TABLETS BY MOUTH 3 TIMES DAILY. 810 tablet 0   cetirizine (ZYRTEC) 10 MG tablet Take 10 mg by mouth daily.     Cholecalciferol (VITAMIN D3) 1000 UNITS CAPS Take 1 capsule by mouth daily.     clonazePAM (KLONOPIN) 0.5 MG tablet TAKE 1/2 TABLET BY MOUTH AT BEDTIME AND 1/2 DAILY AS NEEDED 90 tablet 1   donepezil (ARICEPT) 5 MG tablet Take 1 tablet (5 mg total) by mouth at bedtime. 90 tablet 1   escitalopram (LEXAPRO) 10 MG tablet TAKE 1 TABLET BY MOUTH EVERY DAY 90 tablet 0   feeding supplement (ENSURE COMPLETE) LIQD Take 237 mLs by mouth 2 (two) times daily between meals. 1 Bottle 11   fluticasone (FLONASE) 50 MCG/ACT nasal spray Place 2 sprays into both nostrils daily.     gabapentin (NEURONTIN) 100 MG capsule TAKE 1 CAPSULE BY MOUTH THREE TIMES A DAY 270 capsule 0   glucose blood (ACCU-CHEK GUIDE) test strip USE UP TO 4 TIMES DAILY AS DIRECTED 300 strip 1   losartan-hydrochlorothiazide (HYZAAR) 50-12.5 MG tablet TAKE 1 TABLET BY MOUTH EVERY DAY FOR BLOOD PRESSURE 90 tablet 3   mirtazapine (REMERON) 30 MG tablet TAKE 1 TABLET (30 MG TOTAL) BY MOUTH AT BEDTIME. FOR SLEEP. 90 tablet 3   montelukast (SINGULAIR) 10 MG tablet TAKE 1 TABLET (10 MG TOTAL) BY MOUTH AT BEDTIME. FOR ALLERGIES 90 tablet  3   Multiple Vitamins-Minerals (CENTRUM SILVER ADULT 50+) TABS Take by mouth.     naproxen sodium (ALEVE) 220 MG tablet Take 220 mg by mouth in the morning and at bedtime.     rosuvastatin (CRESTOR) 5 MG tablet TAKE 1 TABLET (5 MG TOTAL) BY MOUTH EVERY EVENING. FOR CHOLESTEROL. 90 tablet 3   vitamin B-12 (CYANOCOBALAMIN) 1000 MCG tablet Take 1,000 mcg by mouth daily.     metFORMIN (GLUCOPHAGE-XR) 500 MG 24 hr tablet Take 1 tablet (500 mg total) by mouth daily with breakfast. for diabetes. (Patient not taking: Reported on 07/05/2022) 90 tablet 1   [DISCONTINUED] simvastatin (ZOCOR) 20 MG tablet TAKE 1 TABLET BY MOUTH EVERY DAY AT BEDTIME FOR CHOLESTEROL 90 tablet 0   No current facility-administered medications on file prior to visit.    BP (!) 162/90   Pulse 90   Temp (!) 97 F (36.1 C) (Temporal)   Ht 5' 1.5" (1.562 m)   Wt 162 lb (73.5 kg)   SpO2 97%   BMI 30.11 kg/m  Objective:   Physical Exam Eyes:     Extraocular Movements: Extraocular movements intact.  Cardiovascular:     Rate and Rhythm: Normal rate.  Skin:    General: Skin is warm and dry.     Findings: Bruising present.     Comments: Mild bruising to right upper eyelid and lower eyelid. Bony tenderness to upper right eye.  Skin intact.  Neurological:     Mental Status: She is alert.     Cranial Nerves: No cranial nerve deficit.     Comments: Answers some questions, follows commands.  Assessment & Plan:   Problem List Items Addressed This Visit       Other   Dysuria - Primary    UA today negative for leukocytes, nitrates, blood. Culture ordered and pending.  Await urine culture results.      Relevant Orders   POCT Urinalysis Dipstick (Automated) (Completed)   Urine Culture   Acute facial pain    Posttraumatic fall. Fortunately, exam today is stable.  Given her tenderness on exam, coupled with increased headaches, we will obtain imaging. She does have a history of dementia and is not  the best historian.  CT head and maxillofacial scans ordered and pending. Strict return/ED precautions provided.      Relevant Orders   CT HEAD WO CONTRAST (5MM)   CT Maxillofacial WO CM   Other Visit Diagnoses     Facial injury, initial encounter       Relevant Orders   CT HEAD WO CONTRAST (5MM)   CT Maxillofacial WO CM          Pleas Koch, NP

## 2022-07-05 NOTE — Assessment & Plan Note (Signed)
UA today negative for leukocytes, nitrates, blood. Culture ordered and pending.  Await urine culture results.

## 2022-07-05 NOTE — Patient Instructions (Signed)
You will receive a phone call today regarding your CT scans.  We will be in touch on Wednesday regarding your urine test results.  It was a pleasure to see you today!

## 2022-07-06 ENCOUNTER — Ambulatory Visit
Admission: RE | Admit: 2022-07-06 | Discharge: 2022-07-06 | Disposition: A | Discharge status: Home / Self Care | Payer: Medicare Other | Source: Ambulatory Visit | Attending: Primary Care | Admitting: Primary Care

## 2022-07-06 DIAGNOSIS — R519 Headache, unspecified: Secondary | ICD-10-CM | POA: Insufficient documentation

## 2022-07-06 DIAGNOSIS — S0993XA Unspecified injury of face, initial encounter: Secondary | ICD-10-CM | POA: Diagnosis not present

## 2022-07-06 LAB — URINE CULTURE
MICRO NUMBER:: 14297263
SPECIMEN QUALITY:: ADEQUATE

## 2022-07-13 NOTE — Telephone Encounter (Signed)
Left message to return call to our office.  

## 2022-07-15 ENCOUNTER — Ambulatory Visit: Payer: Medicare Other | Admitting: Primary Care

## 2022-07-22 NOTE — Telephone Encounter (Signed)
Left message to return call to our office.  

## 2022-07-25 ENCOUNTER — Other Ambulatory Visit: Payer: Self-pay | Admitting: Primary Care

## 2022-07-25 DIAGNOSIS — E1165 Type 2 diabetes mellitus with hyperglycemia: Secondary | ICD-10-CM

## 2022-08-12 NOTE — Telephone Encounter (Signed)
Was not able to reach before end of the year can you resubmit benefits

## 2022-08-13 NOTE — Telephone Encounter (Signed)
Prolia VOB initiated via [email or web address]

## 2022-08-23 ENCOUNTER — Telehealth: Payer: Self-pay

## 2022-08-23 NOTE — Telephone Encounter (Signed)
No answer when called for scheduled AWV. Left message. Okay to reschedule.

## 2022-08-23 NOTE — Telephone Encounter (Signed)
NHA again attempts to reach patient for scheduled AWV. No answer. Left message on voicemail. Okay to reschedule.

## 2022-08-31 ENCOUNTER — Other Ambulatory Visit: Payer: Self-pay

## 2022-08-31 DIAGNOSIS — M810 Age-related osteoporosis without current pathological fracture: Secondary | ICD-10-CM

## 2022-08-31 MED ORDER — DENOSUMAB 60 MG/ML ~~LOC~~ SOSY: 60.0000 mg | PREFILLED_SYRINGE | Freq: Once | SUBCUTANEOUS | 0 refills | Status: AC

## 2022-08-31 NOTE — Telephone Encounter (Signed)
Called patient's daughter, Geni Bers, to schedule appts.  Lab appt for BMP on 09/03/22.  Prolia inj on 09/09/22.  Patient's daughter aware they need to pick up [facility] appr [facility] [address] [facility] [place] prior to inj appt.  Need to check labs are ok for Prolia when available.

## 2022-09-03 ENCOUNTER — Other Ambulatory Visit (HOSPITAL_COMMUNITY): Payer: Self-pay

## 2022-09-03 ENCOUNTER — Other Ambulatory Visit (INDEPENDENT_AMBULATORY_CARE_PROVIDER_SITE_OTHER): Payer: Medicare Other

## 2022-09-03 DIAGNOSIS — M810 Age-related osteoporosis without current pathological fracture: Secondary | ICD-10-CM | POA: Diagnosis not present

## 2022-09-03 NOTE — Addendum Note (Signed)
Addended by: Pilar Grammes on: 09/03/2022 04:07 PM   Modules accepted: Orders

## 2022-09-04 LAB — BASIC METABOLIC PANEL
BUN: 23 mg/dL (ref 7–25)
CO2: 27 mmol/L (ref 20–32)
Calcium: 9.7 mg/dL (ref 8.6–10.4)
Chloride: 100 mmol/L (ref 98–110)
Creat: 0.69 mg/dL (ref 0.60–1.00)
Glucose, Bld: 223 mg/dL — ABNORMAL HIGH (ref 65–99)
Potassium: 4.2 mmol/L (ref 3.5–5.3)
Sodium: 142 mmol/L (ref 135–146)

## 2022-09-06 NOTE — Telephone Encounter (Signed)
Patient's labs ok for inj.

## 2022-09-09 ENCOUNTER — Ambulatory Visit: Payer: Medicare Other

## 2022-09-09 ENCOUNTER — Ambulatory Visit: Payer: Medicare Other | Admitting: Primary Care

## 2022-09-10 ENCOUNTER — Other Ambulatory Visit: Payer: Medicare Other

## 2022-09-10 NOTE — Telephone Encounter (Signed)
I spoke to [place], patient's daughter.  The Prolia inj appt was cancelled, because they have not received the Rx yet.  They were told that it may need to come from a specialty pharmacy.  They will be calling on 09/13/22 to see where the pharmacy is in the process.  Geni Bers will call us when they have the Rx and are ready to resch.

## 2022-09-13 ENCOUNTER — Other Ambulatory Visit: Payer: Self-pay | Admitting: Neurology

## 2022-09-13 ENCOUNTER — Other Ambulatory Visit: Payer: Self-pay | Admitting: Primary Care

## 2022-09-13 DIAGNOSIS — R413 Other amnesia: Secondary | ICD-10-CM

## 2022-09-13 DIAGNOSIS — F341 Dysthymic disorder: Secondary | ICD-10-CM

## 2022-09-13 DIAGNOSIS — G20A1 Parkinson's disease without dyskinesia, without mention of fluctuations: Secondary | ICD-10-CM

## 2022-09-13 DIAGNOSIS — F418 Other specified anxiety disorders: Secondary | ICD-10-CM

## 2022-09-20 NOTE — Telephone Encounter (Signed)
Left vmail for Geni Bers, patient's daughter, to call me back.  If they have received the Prolia inj in the mail, we can schedule Ms Formoso's inj appt.

## 2022-09-27 ENCOUNTER — Encounter: Payer: Self-pay | Admitting: Neurology

## 2022-10-09 ENCOUNTER — Other Ambulatory Visit: Payer: Self-pay | Admitting: Primary Care

## 2022-10-09 DIAGNOSIS — E1165 Type 2 diabetes mellitus with hyperglycemia: Secondary | ICD-10-CM

## 2022-10-10 NOTE — Telephone Encounter (Signed)
Patient is overdue for diabetes follow up, this will be required prior to any further refills.  Please schedule, thank you!

## 2022-10-11 NOTE — Telephone Encounter (Signed)
Lvmtcb, sent mychart message  

## 2022-10-14 ENCOUNTER — Telehealth: Payer: Self-pay | Admitting: Primary Care

## 2022-10-14 NOTE — Telephone Encounter (Signed)
Called patient to schedule Medicare Annual Wellness Visit (AWV). Left message for patient to call back and schedule Medicare Annual Wellness Visit (AWV).  Last date of AWV: 08/17/2021  Please schedule an appointment at any time with NHA.  If any questions, please contact me at [phone].  Thank you ,  [place] Direct Dial: [phone]

## 2022-11-13 ENCOUNTER — Other Ambulatory Visit: Payer: Self-pay | Admitting: Primary Care

## 2022-11-13 DIAGNOSIS — E1165 Type 2 diabetes mellitus with hyperglycemia: Secondary | ICD-10-CM

## 2022-11-22 NOTE — Progress Notes (Signed)
Assessment/Plan:   1.  Parkinsons Disease             -Continue carbidopa/levodopa 25/100, 3 tablets 3 times per day  -discussed need for PT again but declined again.  They just ordered a walker for her    2.  Depression             -On Lexapro             -On mirtazapine             -Managed by PCP   3.  RBD and GAD             -On clonazepam 0.5 mg, half tablet at bedtime and will take half tablet as needed in the day for anxiety.             -PDMP reviewed.  No red flags.   4.  Chronic headache             -On gabapentin, 100 mg 3 times per day.             -Saw Dr. Zachery Conch in the remote past.   5.  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             -Patient last had neurocognitive testing in August, 2021 with Dr. Roseanne Reno.   -she thought that low dose donepezil 5 mg caused eds  -add memantine and work to 10 mg bid     Subjective:   [name] was seen today in follow up for Parkinsons disease.  My previous records were reviewed prior to todays visit as well as outside records available to me.  started on donepezil last visit but she stated it made her tired and she d/c it.  They cannot recall if she was taking AM or PM but think it made her sleepy.   She is still concerned about memory.  Daughter states she has trouble remembering date/day, etc and they have to remind her of conversations.  I did recommend physical therapy (declined).  They are looking for a new walker.  Current prescribed movement disorder medications:  Carbidopa/levodopa 25/100, 3 tablets 3 times per day Mirtazapine, 30 mg at night Gabapentin, 100 mg 3 times per day Clonazepam, 0.25 mg twice daily (PDMP reviewed and last filled 09/13/2022).  Prior meds: aricept, 5 mg (tired and pt d/c)    ALLERGIES:   Allergies  Allergen Reactions   Codeine Sulfate     REACTION: hallucinations   Penicillins Anaphylaxis, Swelling and Rash   Amoxapine And Related    Wellbutrin [Bupropion] Other (See  Comments)    Seizures and hallucinations    CURRENT MEDICATIONS:  Outpatient Encounter Medications as of 11/30/2022  Medication Sig   ACCU-CHEK GUIDE test strip USE UP TO 4 TIMES DAILY AS DIRECTED   Accu-Chek Softclix Lancets lancets USE UP TO 4 TIMES DAILY AS DIRECTED   aspirin 81 MG tablet Take 81 mg by mouth daily.   Bioflavonoid Products (GRAPE SEED PO) Take 1 tablet by mouth daily.    blood glucose meter kit and supplies KIT Dispense based on patient and insurance preference. Use up to four times daily as directed. (FOR ICD-9 250.00, 250.01).   calcium carbonate (TUMS EX) 750 MG chewable tablet Chew 1 tablet by mouth as needed.    Calcium Carbonate-Vitamin D (CALCIUM + D PO) Take 600 mg by mouth daily.   carbidopa-levodopa (SINEMET IR) 25-100 MG tablet TAKE 3  TABLETS BY MOUTH 3 TIMES DAILY.   cetirizine (ZYRTEC) 10 MG tablet Take 10 mg by mouth daily.   Cholecalciferol (VITAMIN D3) 1000 UNITS CAPS Take 1 capsule by mouth daily.   clonazePAM (KLONOPIN) 0.5 MG tablet TAKE 1/2 TABLET BY MOUTH AT BEDTIME AND 1/2 DAILY AS NEEDED   donepezil (ARICEPT) 5 MG tablet TAKE 1 TABLET BY MOUTH EVERYDAY AT BEDTIME (Patient not taking: Reported on 11/30/2022)   escitalopram (LEXAPRO) 10 MG tablet TAKE 1 TABLET BY MOUTH EVERY DAY   feeding supplement (ENSURE COMPLETE) LIQD Take 237 mLs by mouth 2 (two) times daily between meals.   fluticasone (FLONASE) 50 MCG/ACT nasal spray Place 2 sprays into both nostrils daily.   gabapentin (NEURONTIN) 100 MG capsule TAKE 1 CAPSULE BY MOUTH THREE TIMES A DAY   losartan-hydrochlorothiazide (HYZAAR) 50-12.5 MG tablet TAKE 1 TABLET BY MOUTH EVERY DAY FOR BLOOD PRESSURE   metFORMIN (GLUCOPHAGE-XR) 500 MG 24 hr tablet TAKE 1 TABLET (500 MG TOTAL) BY MOUTH DAILY WITH BREAKFAST. FOR DIABETES.   mirtazapine (REMERON) 30 MG tablet TAKE 1 TABLET (30 MG TOTAL) BY MOUTH AT BEDTIME. FOR SLEEP.   montelukast (SINGULAIR) 10 MG tablet TAKE 1 TABLET (10 MG TOTAL) BY MOUTH AT BEDTIME.  FOR ALLERGIES   Multiple Vitamins-Minerals (CENTRUM SILVER ADULT 50+) TABS Take by mouth.   naproxen sodium (ALEVE) 220 MG tablet Take 220 mg by mouth in the morning and at bedtime.   rosuvastatin (CRESTOR) 5 MG tablet TAKE 1 TABLET (5 MG TOTAL) BY MOUTH EVERY EVENING. FOR CHOLESTEROL.   vitamin B-12 (CYANOCOBALAMIN) 1000 MCG tablet Take 1,000 mcg by mouth daily.   [DISCONTINUED] simvastatin (ZOCOR) 20 MG tablet TAKE 1 TABLET BY MOUTH EVERY DAY AT BEDTIME FOR CHOLESTEROL   No facility-administered encounter medications on file as of 11/30/2022.    Objective:   PHYSICAL EXAMINATION:    VITALS:   Vitals:   11/30/22 0814  BP: 136/86  Pulse: 77  SpO2: 95%  Weight: 165 lb 3.2 oz (74.9 kg)  Height: 5' 1.2" (1.554 m)      GEN:  The patient appears stated age and is in NAD. HEENT:  Normocephalic, atraumatic.  The mucous membranes are moist. The superficial temporal arteries are without ropiness or tenderness. CV:  RRR Lungs:  CTAB but there is upper airway noises   Neurological examination:  Orientation: The patient is alert and oriented x3. Cranial nerves: There is good facial symmetry with minimal facial hypomimia. The speech is fluent and clear. Soft palate rises symmetrically and there is no tongue deviation. Hearing is decreased  to conversational tone. Sensation: Sensation is intact to light touch throughout Motor: Strength is at least antigravity x4.  Movement examination: Tone: There is nl tone in the UE/LE Abnormal movements: none Coordination:  There is no decremation with any form of RAMS, including alternating supination and pronation of the forearm, hand opening and closing, finger taps, heel taps and toe taps.  Gait and Station: Patient pushes off of the chair to arise. The patient's stride length is slow and tenuous and a bit unstable (similar to last visit) I have reviewed and interpreted the following labs independently    Chemistry      Component Value  Date/Time   NA 142 09/03/2022 1614   K 4.2 09/03/2022 1614   CL 100 09/03/2022 1614   CO2 27 09/03/2022 1614   BUN 23 09/03/2022 1614   CREATININE 0.69 09/03/2022 1614      Component Value Date/Time   CALCIUM  9.7 09/03/2022 1614   ALKPHOS 63 12/15/2021 0842   AST 11 12/15/2021 0842   ALT 2 12/15/2021 0842   BILITOT 0.5 12/15/2021 0842       Lab Results  Component Value Date   WBC 7.9 12/15/2021   HGB 13.4 12/15/2021   HCT 40.2 12/15/2021   MCV 94.5 12/15/2021   PLT 210.0 12/15/2021    Lab Results  Component Value Date   TSH 1.04 12/07/2017     Total time spent on today's visit was 21 minutes, including both face-to-face time and nonface-to-face time.  Time included that spent on review of records (prior notes available to me/labs/imaging if pertinent), discussing treatment and goals, answering patient's questions and coordinating care.  Cc:  Doreene Nest, NP

## 2022-11-24 ENCOUNTER — Ambulatory Visit: Payer: Medicare Other | Admitting: Neurology

## 2022-11-30 ENCOUNTER — Ambulatory Visit (INDEPENDENT_AMBULATORY_CARE_PROVIDER_SITE_OTHER): Payer: Medicare Other | Admitting: Neurology

## 2022-11-30 ENCOUNTER — Encounter: Payer: Self-pay | Admitting: Neurology

## 2022-11-30 VITALS — BP 136/86 | HR 77 | Ht 61.2 in | Wt 165.2 lb

## 2022-11-30 DIAGNOSIS — R413 Other amnesia: Secondary | ICD-10-CM | POA: Diagnosis not present

## 2022-11-30 DIAGNOSIS — G20A1 Parkinson's disease without dyskinesia, without mention of fluctuations: Secondary | ICD-10-CM

## 2022-11-30 MED ORDER — MEMANTINE HCL 10 MG PO TABS: 10.0000 mg | ORAL_TABLET | Freq: Two times a day (BID) | ORAL | 1 refills | Status: DC

## 2022-11-30 MED ORDER — MEMANTINE HCL 28 X 5 MG & 21 X 10 MG PO TABS: ORAL_TABLET | ORAL | 0 refills | Status: DC

## 2022-11-30 NOTE — Patient Instructions (Signed)
You have been referred for a neurocognitive evaluation (i.e., evaluation of memory and thinking abilities). Please bring someone with you to this appointment if possible, as it is helpful for the neuropsychologist to hear from both you and another adult who knows you well. Please bring eyeglasses and hearing aids if you wear them and take any medications as you normally would.  °  °The evaluation will take approximately 2-3 hours and has two parts: °  °The first part is a clinical interview with the neuropsychologist, Dr. Merz.  During the interview, the neuropsychologist will speak with you and the individual you brought to the appointment.  °  °The second part of the evaluation is testing with the doctor's technician, aka psychometrician, Dana or Kim. During the testing, the technician will ask you to remember different types of material, solve problems, and answer some questionnaires. Your family member will not be present for this portion of the evaluation. °  °Please note: We have to reserve several hours of the neuropsychologist's time and the psychometrician's time for your evaluation appointment. As such, there is a No-Show fee of $100. If you are unable to attend any of your appointments, please contact our office as soon as possible to reschedule. ° °

## 2022-12-08 ENCOUNTER — Other Ambulatory Visit: Payer: Self-pay | Admitting: Neurology

## 2022-12-08 DIAGNOSIS — F418 Other specified anxiety disorders: Secondary | ICD-10-CM

## 2022-12-25 ENCOUNTER — Other Ambulatory Visit: Payer: Self-pay | Admitting: Primary Care

## 2022-12-25 DIAGNOSIS — E782 Mixed hyperlipidemia: Secondary | ICD-10-CM

## 2022-12-25 DIAGNOSIS — I10 Essential (primary) hypertension: Secondary | ICD-10-CM

## 2022-12-25 DIAGNOSIS — J309 Allergic rhinitis, unspecified: Secondary | ICD-10-CM

## 2022-12-28 ENCOUNTER — Other Ambulatory Visit: Payer: Self-pay | Admitting: Neurology

## 2022-12-28 DIAGNOSIS — G20A1 Parkinson's disease without dyskinesia, without mention of fluctuations: Secondary | ICD-10-CM

## 2022-12-28 DIAGNOSIS — R413 Other amnesia: Secondary | ICD-10-CM

## 2022-12-28 MED ORDER — MEMANTINE HCL 10 MG PO TABS
ORAL_TABLET | ORAL | 0 refills | Status: DC
Start: 2022-12-28 — End: 2023-06-29

## 2023-01-04 DIAGNOSIS — Z961 Presence of intraocular lens: Secondary | ICD-10-CM | POA: Diagnosis not present

## 2023-01-04 DIAGNOSIS — E119 Type 2 diabetes mellitus without complications: Secondary | ICD-10-CM | POA: Diagnosis not present

## 2023-01-04 DIAGNOSIS — H52223 Regular astigmatism, bilateral: Secondary | ICD-10-CM | POA: Diagnosis not present

## 2023-01-04 DIAGNOSIS — H16211 Exposure keratoconjunctivitis, right eye: Secondary | ICD-10-CM | POA: Diagnosis not present

## 2023-01-06 ENCOUNTER — Other Ambulatory Visit: Payer: Self-pay | Admitting: Primary Care

## 2023-01-06 DIAGNOSIS — F341 Dysthymic disorder: Secondary | ICD-10-CM

## 2023-01-07 ENCOUNTER — Other Ambulatory Visit: Payer: Self-pay | Admitting: Neurology

## 2023-01-07 DIAGNOSIS — F418 Other specified anxiety disorders: Secondary | ICD-10-CM

## 2023-01-07 DIAGNOSIS — G20A1 Parkinson's disease without dyskinesia, without mention of fluctuations: Secondary | ICD-10-CM

## 2023-01-07 MED ORDER — MEMANTINE HCL 10 MG PO TABS: 10.0000 mg | ORAL_TABLET | Freq: Two times a day (BID) | ORAL | 1 refills | Status: DC

## 2023-01-08 ENCOUNTER — Other Ambulatory Visit: Payer: Self-pay | Admitting: Primary Care

## 2023-01-08 DIAGNOSIS — E1165 Type 2 diabetes mellitus with hyperglycemia: Secondary | ICD-10-CM

## 2023-01-20 ENCOUNTER — Ambulatory Visit: Payer: Medicare Other | Admitting: Primary Care

## 2023-02-08 ENCOUNTER — Ambulatory Visit: Payer: Medicare Other | Admitting: Primary Care

## 2023-02-08 ENCOUNTER — Encounter: Payer: Self-pay | Admitting: Primary Care

## 2023-02-08 VITALS — BP 110/70 | HR 71 | Temp 97.9°F | Ht 61.2 in | Wt 162.0 lb

## 2023-02-08 DIAGNOSIS — R8290 Unspecified abnormal findings in urine: Secondary | ICD-10-CM | POA: Diagnosis not present

## 2023-02-08 LAB — POC URINALSYSI DIPSTICK (AUTOMATED)
Bilirubin, UA: NEGATIVE
Blood, UA: NEGATIVE
Glucose, UA: NEGATIVE
Ketones, UA: POSITIVE
Nitrite, UA: NEGATIVE
Protein, UA: POSITIVE — AB
Spec Grav, UA: 1.02 (ref 1.010–1.025)
Urobilinogen, UA: 0.2 E.U./dL
pH, UA: 6 (ref 5.0–8.0)

## 2023-02-08 MED ORDER — NITROFURANTOIN MONOHYD MACRO 100 MG PO CAPS
100.0000 mg | ORAL_CAPSULE | Freq: Two times a day (BID) | ORAL | 0 refills | Status: AC
Start: 2023-02-08 — End: 2023-02-13

## 2023-02-08 NOTE — Assessment & Plan Note (Signed)
Symptoms suspicious for cystitis.  Urinalysis with 3+ leuks. Urine culture ordered and pending.  Given symptoms, coupled with leukocytosis and urinalysis, will treat for presumed infection.  Start Macrobid (nitrofurantoin) tablets. Take 1 tablet by mouth twice daily for 5 days.  Return/ED precautions provided.

## 2023-02-08 NOTE — Progress Notes (Signed)
Subjective:    Patient ID: [name], female    DOB: August 06, 1945, 77 y.o.   MRN: [id number]  Urinary Tract Infection  Pertinent negatives include no flank pain, hematuria or urgency.    [name] is a very pleasant 77 y.o. female with a history of type 2 diabetes, recurrent UTI, vascular parkinsonism, urinary urgency, dysuria who presents today to discuss foul-smelling urine.  Her daughter joins [place] today who helps to provide information for HPI.   Symptom onset about 1 week ago with increased foul smelling urine. She's fallen three three times within the last one week without injury. Her daughter mentions a change in her mentation and is moving a lot slower. Her appetite has decreased.   She denies pelvic pressure, hematuria, flank pain, fevers. She does not drink water during the day, only drinks about 24 oz of Diet [facility].   Her last culture positive infection was in June 2023, Aerococcus urinae.   Review of Systems  Constitutional:  Positive for fatigue. Negative for fever.  Cardiovascular:  Negative for chest pain.  Gastrointestinal:  Negative for abdominal pain, constipation and diarrhea.  Genitourinary:  Negative for dysuria, flank pain, hematuria and urgency.       Foul smelling urine  Psychiatric/Behavioral:  Positive for confusion.          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    "can't do either"   DVT (deep venous thrombosis), left "early 2000's"   RLE   Dysmetabolic syndrome X    Essential hypertension, benign    Headache(784.0) 05/2011   "pretty often since they put me on Parkinson's medicine"   History of shingles    "as a teen and in my 30's"   Mixed hyperlipidemia    Morbid obesity (HCC)    Neurodegenerative gait disorder    Personal history of  thrombophlebitis    Syncope and collapse 12/06/11   "this am; I pass out fairly often"   Type II or unspecified type diabetes mellitus without mention of complication, not stated as uncontrolled 12/06/11   "not anymore; had lap band OR"    Social History   Socioeconomic History   Marital status: Widowed    Spouse name: Not on file   Number of children: 2   Years of education: Not on file   Highest education level: Some college, no degree  Occupational History   Occupation: Restaurant manager, fast food: [employer]    Comment: retired, 04/2012  Tobacco Use   Smoking status: Never   Smokeless tobacco: Never  Vaping Use   Vaping status: Never Used  Substance and Sexual Activity   Alcohol use: No    Comment: none   Drug use: No   Sexual activity: Not Currently  Other Topics Concern   Not on file  Social History Narrative   Right handed   One story home   Lives with daughter and granddaughter.   Daughter is HPOA.   DNR.   Social Determinants of Health   Financial Resource Strain: Low Risk  (08/20/2021)   Overall Financial Resource Strain (CARDIA)    Difficulty of Paying Living Expenses: Not hard at all  Food Insecurity: No Food Insecurity (08/20/2021)   Hunger  Vital Sign    Worried About Programme researcher, broadcasting/film/video in the Last Year: Never true    Ran Out of Food in the Last Year: Never true  Transportation Needs: No Transportation Needs (08/20/2021)   PRAPARE - Administrator, Civil Service (Medical): No    Lack of Transportation (Non-Medical): No  Physical Activity: Insufficiently Active (08/20/2021)   Exercise Vital Sign    Days of Exercise per Week: 3 days    Minutes of Exercise per Session: 20 min  Stress: No Stress Concern Present (08/20/2021)   [employer] of Occupational Health - Occupational Stress Questionnaire    Feeling of Stress : Not at all  Social Connections: Socially Isolated (08/20/2021)   Social Connection and Isolation Panel [NHANES]     Frequency of Communication with Friends and Family: Never    Frequency of Social Gatherings with Friends and Family: More than three times a week    Attends Religious Services: Never    Database administrator or Organizations: No    Attends Banker Meetings: Never    Marital Status: Widowed  Intimate Partner Violence: Not At Risk (08/20/2021)   Humiliation, Afraid, Rape, and Kick questionnaire    Fear of Current or Ex-Partner: No    Emotionally Abused: No    Physically Abused: No    Sexually Abused: No    Past Surgical History:  Procedure Laterality Date   bladder tack  11/1995   BREAST BIOPSY  05/20/2000   bilaterally   BREAST CYST ASPIRATION  12/1998   bilaterally   CARDIAC CATHETERIZATION  2004   CATARACT EXTRACTION W/ INTRAOCULAR LENS  IMPLANT, BILATERAL     FRACTURE SURGERY  02/09/2006   bilateral elbows   FRACTURE SURGERY     right knee   FRACTURE SURGERY     tib plateau   KNEE ARTHROSCOPY  12/12/2002   right    LAPAROSCOPIC GASTRIC BANDING  2008   SEPTOPLASTY  08/1998   with antral window    TONSILLECTOMY AND ADENOIDECTOMY  1970   TUBAL LIGATION  1970's    Family History  Problem Relation Age of Onset   Asthma Mother    COPD Father    Heart disease Father    Diabetes Sister    Vision loss Sister        legally blind   Hypertension Child    Hyperlipidemia Child    Diabetes Child    Diabetes Son    Breast cancer Neg Hx     Allergies  Allergen Reactions   Codeine Sulfate     REACTION: hallucinations   Penicillins Anaphylaxis, Swelling and Rash   Amoxapine And Related    Wellbutrin [Bupropion] Other (See Comments)    Seizures and hallucinations    Current Outpatient Medications on File Prior to Visit  Medication Sig Dispense Refill   ACCU-CHEK GUIDE test strip USE UP TO 4 TIMES DAILY AS DIRECTED 300 strip 1   Accu-Chek Softclix Lancets lancets USE UP TO 4 TIMES DAILY AS DIRECTED 200 each 1   aspirin 81 MG tablet Take 81 mg by mouth  daily.     Bioflavonoid Products (GRAPE SEED PO) Take 1 tablet by mouth daily.      blood glucose meter kit and supplies KIT Dispense based on patient and insurance preference. Use up to four times daily as directed. (FOR ICD-9 250.00, 250.01). 1 each 0   calcium carbonate (TUMS EX) 750 MG chewable tablet Chew 1 tablet  by mouth as needed.      Calcium Carbonate-Vitamin D (CALCIUM + D PO) Take 600 mg by mouth daily.     carbidopa-levodopa (SINEMET IR) 25-100 MG tablet TAKE 3 TABLETS BY MOUTH 3 TIMES DAILY. 810 tablet 0   cetirizine (ZYRTEC) 10 MG tablet Take 10 mg by mouth daily.     Cholecalciferol (VITAMIN D3) 1000 UNITS CAPS Take 1 capsule by mouth daily.     clonazePAM (KLONOPIN) 0.5 MG tablet TAKE 1/2 TABLET BY MOUTH AT BEDTIME AND 1/2 DAILY AS NEEDED 90 tablet 1   escitalopram (LEXAPRO) 10 MG tablet TAKE 1 TABLET BY MOUTH EVERY DAY 90 tablet 0   feeding supplement (ENSURE COMPLETE) LIQD Take 237 mLs by mouth 2 (two) times daily between meals. 1 Bottle 11   fluticasone (FLONASE) 50 MCG/ACT nasal spray Place 2 sprays into both nostrils daily.     gabapentin (NEURONTIN) 100 MG capsule TAKE 1 CAPSULE BY MOUTH THREE TIMES A DAY 270 capsule 0   losartan-hydrochlorothiazide (HYZAAR) 50-12.5 MG tablet TAKE 1 TABLET BY MOUTH EVERY DAY FOR BLOOD PRESSURE 90 tablet 0   memantine (NAMENDA TITRATION PACK) tablet pack 5 mg/day x 1 week; 5 mg twice bid x 1 week; 5 mg in AM, 10 mg in PM x 1 week; then 10 mg twice daily 1 tablet 0   memantine (NAMENDA) 10 MG tablet Take 1 tablet (10 mg total) by mouth 2 (two) times daily. 180 tablet 1   memantine (NAMENDA) 10 MG tablet Take 1 pill twice a day 180 tablet 0   memantine (NAMENDA) 10 MG tablet Take 1 tablet (10 mg total) by mouth 2 (two) times daily. 180 tablet 1   metFORMIN (GLUCOPHAGE-XR) 500 MG 24 hr tablet TAKE 1 TABLET (500 MG TOTAL) BY MOUTH DAILY WITH BREAKFAST. FOR DIABETES. 90 tablet 0   mirtazapine (REMERON) 30 MG tablet TAKE 1 TABLET (30 MG TOTAL) BY  MOUTH AT BEDTIME. FOR SLEEP. 90 tablet 0   montelukast (SINGULAIR) 10 MG tablet TAKE 1 TABLET (10 MG TOTAL) BY MOUTH AT BEDTIME. FOR ALLERGIES 90 tablet 0   Multiple Vitamins-Minerals (CENTRUM SILVER ADULT 50+) TABS Take by mouth.     naproxen sodium (ALEVE) 220 MG tablet Take 220 mg by mouth in the morning and at bedtime.     rosuvastatin (CRESTOR) 5 MG tablet TAKE 1 TABLET (5 MG TOTAL) BY MOUTH EVERY EVENING. FOR CHOLESTEROL. 90 tablet 0   vitamin B-12 (CYANOCOBALAMIN) 1000 MCG tablet Take 1,000 mcg by mouth daily.     donepezil (ARICEPT) 5 MG tablet TAKE 1 TABLET BY MOUTH EVERYDAY AT BEDTIME (Patient not taking: Reported on 11/30/2022) 90 tablet 0   [DISCONTINUED] simvastatin (ZOCOR) 20 MG tablet TAKE 1 TABLET BY MOUTH EVERY DAY AT BEDTIME FOR CHOLESTEROL 90 tablet 0   No current facility-administered medications on file prior to visit.    BP 110/70   Pulse 71   Temp 97.9 F (36.6 C) (Temporal)   Ht 5' 1.2" (1.554 m)   Wt 162 lb (73.5 kg)   SpO2 94%   BMI 30.41 kg/m  Objective:   Physical Exam Constitutional:      General: She is not in acute distress. Cardiovascular:     Rate and Rhythm: Normal rate and regular rhythm.  Pulmonary:     Effort: Pulmonary effort is normal.     Breath sounds: Normal breath sounds.  Abdominal:     Palpations: Abdomen is soft.     Tenderness: There is no abdominal  tenderness. There is no right CVA tenderness or left CVA tenderness.  Musculoskeletal:     Cervical back: Neck supple.  Skin:    General: Skin is warm and dry.  Neurological:     Mental Status: She is alert and oriented to person, place, and time.           Assessment & Plan:  Foul smelling urine Assessment & Plan: Symptoms suspicious for cystitis.  Urinalysis with 3+ leuks. Urine culture ordered and pending.  Given symptoms, coupled with leukocytosis and urinalysis, will treat for presumed infection.  Start Macrobid (nitrofurantoin) tablets. Take 1 tablet by mouth twice  daily for 5 days.  Return/ED precautions provided.    Orders: -     POCT Urinalysis Dipstick (Automated) -     Urine Culture -     Nitrofurantoin Monohyd Macro; Take 1 capsule (100 mg total) by mouth 2 (two) times daily for 5 days.  Dispense: 10 capsule; Refill: 0        Doreene Nest, NP

## 2023-02-08 NOTE — Patient Instructions (Signed)
Start Macrobid (nitrofurantoin) tablets. Take 1 tablet by mouth twice daily for 5 days.  We will be in touch in a few days regarding your final urine test results.   Increase water intake.   It was a pleasure to see you today!

## 2023-02-09 LAB — URINE CULTURE
MICRO NUMBER:: 15206372
Result:: NO GROWTH
SPECIMEN QUALITY:: ADEQUATE

## 2023-02-28 ENCOUNTER — Other Ambulatory Visit: Payer: Self-pay | Admitting: Neurology

## 2023-02-28 DIAGNOSIS — G20A1 Parkinson's disease without dyskinesia, without mention of fluctuations: Secondary | ICD-10-CM

## 2023-03-02 ENCOUNTER — Other Ambulatory Visit: Payer: Self-pay | Admitting: Neurology

## 2023-03-02 DIAGNOSIS — R413 Other amnesia: Secondary | ICD-10-CM

## 2023-03-02 DIAGNOSIS — G20A1 Parkinson's disease without dyskinesia, without mention of fluctuations: Secondary | ICD-10-CM

## 2023-03-02 DIAGNOSIS — F418 Other specified anxiety disorders: Secondary | ICD-10-CM

## 2023-03-10 ENCOUNTER — Encounter: Payer: Self-pay | Admitting: Neurology

## 2023-03-17 ENCOUNTER — Emergency Department (HOSPITAL_COMMUNITY)
Admission: EM | Admit: 2023-03-17 | Discharge: 2023-03-17 | Disposition: A | Discharge status: Home / Self Care | Payer: Medicare Other | Source: Home / Self Care | Attending: Emergency Medicine | Admitting: Emergency Medicine

## 2023-03-17 ENCOUNTER — Other Ambulatory Visit: Payer: Self-pay

## 2023-03-17 ENCOUNTER — Encounter (HOSPITAL_COMMUNITY): Payer: Self-pay

## 2023-03-17 ENCOUNTER — Emergency Department (HOSPITAL_COMMUNITY): Payer: Medicare Other

## 2023-03-17 DIAGNOSIS — Z79899 Other long term (current) drug therapy: Secondary | ICD-10-CM | POA: Diagnosis not present

## 2023-03-17 DIAGNOSIS — I10 Essential (primary) hypertension: Secondary | ICD-10-CM | POA: Diagnosis not present

## 2023-03-17 DIAGNOSIS — D72829 Elevated white blood cell count, unspecified: Secondary | ICD-10-CM | POA: Insufficient documentation

## 2023-03-17 DIAGNOSIS — R296 Repeated falls: Secondary | ICD-10-CM | POA: Insufficient documentation

## 2023-03-17 DIAGNOSIS — N3000 Acute cystitis without hematuria: Secondary | ICD-10-CM | POA: Insufficient documentation

## 2023-03-17 DIAGNOSIS — W19XXXA Unspecified fall, initial encounter: Secondary | ICD-10-CM | POA: Diagnosis not present

## 2023-03-17 DIAGNOSIS — F0390 Unspecified dementia without behavioral disturbance: Secondary | ICD-10-CM | POA: Insufficient documentation

## 2023-03-17 DIAGNOSIS — Z743 Need for continuous supervision: Secondary | ICD-10-CM | POA: Diagnosis not present

## 2023-03-17 DIAGNOSIS — Z043 Encounter for examination and observation following other accident: Secondary | ICD-10-CM | POA: Diagnosis not present

## 2023-03-17 DIAGNOSIS — S0990XA Unspecified injury of head, initial encounter: Secondary | ICD-10-CM | POA: Diagnosis not present

## 2023-03-17 DIAGNOSIS — R0902 Hypoxemia: Secondary | ICD-10-CM | POA: Diagnosis not present

## 2023-03-17 DIAGNOSIS — Z7982 Long term (current) use of aspirin: Secondary | ICD-10-CM | POA: Insufficient documentation

## 2023-03-17 LAB — CBC WITH DIFFERENTIAL/PLATELET
Abs Immature Granulocytes: 0.05 10*3/uL (ref 0.00–0.07)
Basophils Absolute: 0.1 10*3/uL (ref 0.0–0.1)
Basophils Relative: 0 %
Eosinophils Absolute: 0.2 10*3/uL (ref 0.0–0.5)
Eosinophils Relative: 2 %
HCT: 41.9 % (ref 36.0–46.0)
Hemoglobin: 13.5 g/dL (ref 12.0–15.0)
Immature Granulocytes: 0 %
Lymphocytes Relative: 8 %
Lymphs Abs: 0.9 10*3/uL (ref 0.7–4.0)
MCH: 31 pg (ref 26.0–34.0)
MCHC: 32.2 g/dL (ref 30.0–36.0)
MCV: 96.3 fL (ref 80.0–100.0)
Monocytes Absolute: 0.8 10*3/uL (ref 0.1–1.0)
Monocytes Relative: 7 %
Neutro Abs: 9.6 10*3/uL — ABNORMAL HIGH (ref 1.7–7.7)
Neutrophils Relative %: 83 %
Platelets: 199 10*3/uL (ref 150–400)
RBC: 4.35 MIL/uL (ref 3.87–5.11)
RDW: 12.5 % (ref 11.5–15.5)
WBC: 11.6 10*3/uL — ABNORMAL HIGH (ref 4.0–10.5)
nRBC: 0 % (ref 0.0–0.2)

## 2023-03-17 LAB — CBG MONITORING, ED: Glucose-Capillary: 158 mg/dL — ABNORMAL HIGH (ref 70–99)

## 2023-03-17 LAB — URINALYSIS, ROUTINE W REFLEX MICROSCOPIC
Bilirubin Urine: NEGATIVE
Glucose, UA: NEGATIVE mg/dL
Hgb urine dipstick: NEGATIVE
Ketones, ur: 5 mg/dL — AB
Nitrite: NEGATIVE
Protein, ur: NEGATIVE mg/dL
Specific Gravity, Urine: 1.023 (ref 1.005–1.030)
pH: 5 (ref 5.0–8.0)

## 2023-03-17 LAB — BASIC METABOLIC PANEL
Anion gap: 8 (ref 5–15)
BUN: 27 mg/dL — ABNORMAL HIGH (ref 8–23)
CO2: 27 mmol/L (ref 22–32)
Calcium: 9.6 mg/dL (ref 8.9–10.3)
Chloride: 102 mmol/L (ref 98–111)
Creatinine, Ser: 0.76 mg/dL (ref 0.44–1.00)
GFR, Estimated: >60 mL/min (ref >60)
Glucose, Bld: 164 mg/dL — ABNORMAL HIGH (ref 70–99)
Potassium: 3.8 mmol/L (ref 3.5–5.1)
Sodium: 137 mmol/L (ref 135–145)

## 2023-03-17 MED ORDER — SODIUM CHLORIDE 0.9 % IV SOLN
1.0000 g | Freq: Once | INTRAVENOUS | Status: AC
  Administered 2023-03-17: 1 g via INTRAVENOUS
  Filled 2023-03-17: qty 10

## 2023-03-17 MED ORDER — CEPHALEXIN 250 MG PO CAPS: 250.0000 mg | ORAL_CAPSULE | Freq: Four times a day (QID) | ORAL | 0 refills | Status: AC

## 2023-03-17 NOTE — Discharge Instructions (Addendum)
Thank you for coming to [facility] Emergency Department. You were seen for recurrent falls. We did an exam, labs, and imaging, and these showed likely a urinary tract infection. We treated with IV antibiotics in the ED and discharged with oral Keflex for 5 days.  Please follow up with your primary care provider within 1 week.   Do not hesitate to return to the ED or call 911 if you experience: -Worsening symptoms -Abdominal pain, flank pain -Lightheadedness, passing out -Fevers/chills -Anything else that concerns you

## 2023-03-17 NOTE — ED Notes (Signed)
Patient up to bathroom to get urine sample.

## 2023-03-17 NOTE — ED Provider Notes (Signed)
[place] EMERGENCY DEPARTMENT AT [facility] Provider Note   CSN: [id number] Arrival date & time: 03/17/23  1446     History  Chief Complaint  Patient presents with   [name]    [name] is a 77 y.o. female with PMH as listed below who presents BIB EMS accompanied by her daughter [name] for 2 falls today. Daughter witnessed the first one. Did not hit her head or lose consciousness. More slid out of bed. For the second one, unwitnessed, unsure what happened. Patient does not remember the second fall. Patient denies dizziness. Patient denies any complaints, denies pain anywhere. Denies f/c, dysuria/hematuria, cough, N/V, abdominal or chest pain, dizziness, asymmetric weakness, numbness/tingling. Not on blood thinners. Patient has hx of dementia and Bell's Palsy. Patient is at baseline per family. Daughter states that she normally falls a couple of times per week. Patient lives with her daughter and granddaughter. Sometimes walks with a walker and sometimes uses a wheelchair, mostly when they leave the house. She doesn't really walk much, that isn't a new thing. Patient states she feels like she has enough help at home. Falls today weren't special, but daughter brought her in "just to be checked out." She has mirtazapine 30 mg at bedtime, gabapentin 100 mg TID, and klonopin 0.5 mg BID listed among other home medicines and daughter states she sleeps all the time.    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    "can't do either"   DVT (deep venous thrombosis), left "early 2000's"   RLE   Dysmetabolic syndrome X    Essential hypertension, benign    Headache(784.0) 05/2011   "pretty often since they put me on Parkinson's medicine"   History of shingles    "as a teen and in my 30's"   Mixed  hyperlipidemia    Morbid obesity (HCC)    Neurodegenerative gait disorder    Personal history of thrombophlebitis    Syncope and collapse 12/06/11   "this am; I pass out fairly often"   Type II or unspecified type diabetes mellitus without mention of complication, not stated as uncontrolled 12/06/11   "not anymore; had lap band OR"       Home Medications Prior to Admission medications   Medication Sig Start Date End Date Taking? Authorizing Provider  cephALEXin (KEFLEX) 250 MG capsule Take 1 capsule (250 mg total) by mouth 4 (four) times daily for 5 days. 03/17/23 03/22/23 Yes Loetta Rough, MD  ACCU-CHEK GUIDE test strip USE UP TO 4 TIMES DAILY AS DIRECTED 11/14/22   Doreene Nest, NP  Accu-Chek Softclix Lancets lancets USE UP TO 4 TIMES DAILY AS DIRECTED 07/25/22   Doreene Nest, NP  aspirin 81 MG tablet Take 81 mg by mouth daily.    [provider]  Bioflavonoid Products (GRAPE SEED PO) Take 1 tablet by mouth daily.     [provider]  blood glucose meter kit and supplies KIT Dispense based on patient and insurance preference. Use up to four times daily as directed. (FOR ICD-9 250.00, 250.01). 04/15/22   Doreene Nest, NP  calcium carbonate (TUMS EX) 750 MG chewable tablet Chew 1 tablet by mouth as needed.     [provider]  Calcium  Carbonate-Vitamin D (CALCIUM + D PO) Take 600 mg by mouth daily.    [provider]  carbidopa-levodopa (SINEMET IR) 25-100 MG tablet TAKE 3 TABLETS BY MOUTH 3 TIMES DAILY. 01/07/23   Tat, Octaviano Batty, DO  cetirizine (ZYRTEC) 10 MG tablet Take 10 mg by mouth daily.    [provider]  Cholecalciferol (VITAMIN D3) 1000 UNITS CAPS Take 1 capsule by mouth daily.    [provider]  clonazePAM (KLONOPIN) 0.5 MG tablet TAKE 1/2 TABLET BY MOUTH AT BEDTIME AND 1/2 DAILY AS NEEDED 12/09/22   Tat, Octaviano Batty, DO  donepezil (ARICEPT) 5 MG tablet TAKE 1 TABLET BY MOUTH EVERYDAY AT BEDTIME Patient not  taking: Reported on 11/30/2022 09/14/22   Tat, Octaviano Batty, DO  escitalopram (LEXAPRO) 10 MG tablet TAKE 1 TABLET BY MOUTH EVERY DAY 01/07/23   Tat, Octaviano Batty, DO  feeding supplement (ENSURE COMPLETE) LIQD Take 237 mLs by mouth 2 (two) times daily between meals. 04/01/12   Alison Murray, MD  fluticasone [facility]) 50 MCG/ACT nasal spray Place 2 sprays into both nostrils daily. 10/02/20   [provider]  gabapentin (NEURONTIN) 100 MG capsule TAKE 1 CAPSULE BY MOUTH THREE TIMES A DAY 03/01/23   Tat, Octaviano Batty, DO  losartan-hydrochlorothiazide (HYZAAR) 50-12.5 MG tablet TAKE 1 TABLET BY MOUTH EVERY DAY FOR BLOOD PRESSURE 12/26/22   Doreene Nest, NP  memantine [facility] TITRATION PACK) tablet pack 5 mg/day x 1 week; 5 mg twice bid x 1 week; 5 mg in AM, 10 mg in PM x 1 week; then 10 mg twice daily 11/30/22   Tat, Octaviano Batty, DO  memantine (NAMENDA) 10 MG tablet Take 1 tablet (10 mg total) by mouth 2 (two) times daily. 11/30/22   Tat, Octaviano Batty, DO  memantine (NAMENDA) 10 MG tablet Take 1 pill twice a day 12/28/22   Tat, Octaviano Batty, DO  memantine (NAMENDA) 10 MG tablet Take 1 tablet (10 mg total) by mouth 2 (two) times daily. 01/07/23   Tat, Octaviano Batty, DO  metFORMIN (GLUCOPHAGE-XR) 500 MG 24 hr tablet TAKE 1 TABLET (500 MG TOTAL) BY MOUTH DAILY WITH BREAKFAST. FOR DIABETES. 01/08/23   Doreene Nest, NP  mirtazapine (REMERON) 30 MG tablet TAKE 1 TABLET (30 MG TOTAL) BY MOUTH AT BEDTIME. FOR SLEEP. 01/07/23   Doreene Nest, NP  montelukast (SINGULAIR) 10 MG tablet TAKE 1 TABLET (10 MG TOTAL) BY MOUTH AT BEDTIME. FOR ALLERGIES 12/26/22   Doreene Nest, NP  Multiple Vitamins-Minerals (CENTRUM SILVER ADULT 50+) TABS Take by mouth.    [provider]  naproxen sodium (ALEVE) 220 MG tablet Take 220 mg by mouth in the morning and at bedtime.    [provider]  rosuvastatin (CRESTOR) 5 MG tablet TAKE 1 TABLET (5 MG TOTAL) BY MOUTH EVERY EVENING. FOR CHOLESTEROL. 12/26/22   Doreene Nest, NP   vitamin B-12 (CYANOCOBALAMIN) 1000 MCG tablet Take 1,000 mcg by mouth daily.    [provider]  simvastatin (ZOCOR) 20 MG tablet TAKE 1 TABLET BY MOUTH EVERY DAY AT BEDTIME FOR CHOLESTEROL 10/01/19 01/02/20  Doreene Nest, NP      Allergies    Codeine sulfate, Penicillins, Amoxapine and related, and Wellbutrin [bupropion]    Review of Systems   Review of Systems A 10 point review of systems was performed and is negative unless otherwise reported in HPI.  Physical Exam Updated Vital Signs BP 121/62   Pulse 62   Temp 97.8 F (  36.6 C) (Oral)   Resp 19   Ht 5\' 2"  (1.575 m)   Wt 73.5 kg   SpO2 95%   BMI 29.64 kg/m  Physical Exam General: Normal appearing elderly female, lying in bed.  HEENT: PERRLA, Sclera anicteric, MMM, trachea midline. NCAT. Baseline facial droop.  Cardiology: RRR, no murmurs/rubs/gallops. BL radial and DP pulses equal bilaterally.  Resp: Normal respiratory rate and effort. CTAB, no wheezes, rhonchi, crackles.  Abd: Soft, non-tender, non-distended. No rebound tenderness or guarding.  Pelvis: Pelvis stable, nontender.  MSK: No peripheral edema or signs of trauma. Extremities without deformity or TTP.  Skin: warm, dry.  Back: No midline C, T or L spine Ttp. No CVA TTP.  Neuro: A&Ox4, CNs II-XII grossly intact. 5/5 strength all extremities. Sensation grossly intact.  Psych: Normal/pleasant mood and affect.   ED Results / Procedures / Treatments   Labs (all labs ordered are listed, but only abnormal results are displayed) Labs Reviewed  CBC WITH DIFFERENTIAL/PLATELET - Abnormal; Notable for the following components:      Result Value   WBC 11.6 (*)    Neutro Abs 9.6 (*)    All other components within normal limits  BASIC METABOLIC PANEL - Abnormal; Notable for the following components:   Glucose, Bld 164 (*)    BUN 27 (*)    All other components within normal limits  URINALYSIS, ROUTINE W REFLEX MICROSCOPIC - Abnormal; Notable for the following  components:   APPearance CLOUDY (*)    Ketones, ur 5 (*)    Leukocytes,Ua SMALL (*)    Bacteria, UA MANY (*)    All other components within normal limits  CBG MONITORING, ED - Abnormal; Notable for the following components:   Glucose-Capillary 158 (*)    All other components within normal limits  URINE CULTURE    EKG EKG Interpretation Date/Time:  Thursday March 17 2023 15:18:25 EDT Ventricular Rate:  65 PR Interval:  159 QRS Duration:  97 QT Interval:  431 QTC Calculation: 449 R Axis:   -86  Text Interpretation: Sinus rhythm RSR' in V1 or V2, right VCD or RVH Inferior infarct, old Confirmed by Vivi Barrack [phone]) on 03/17/2023 5:27:11 PM  Radiology DG Chest 2 View  Result Date: 03/17/2023 CLINICAL DATA:  Fall EXAM: CHEST - 2 VIEW COMPARISON:  01/12/2021 FINDINGS: The heart size and mediastinal contours are within normal limits. Both lungs are clear. The visualized skeletal structures are unremarkable. No pneumothorax IMPRESSION: No active cardiopulmonary disease. Electronically Signed   By: Charlett Nose M.D.   On: 03/17/2023 17:34   CT Head Wo Contrast  Result Date: 03/17/2023 CLINICAL DATA:  Head trauma, minor (Age >= 65y).  Fall. EXAM: CT HEAD WITHOUT CONTRAST TECHNIQUE: Contiguous axial images were obtained from the base of the skull through the vertex without intravenous contrast. RADIATION DOSE REDUCTION: This exam was performed according to the departmental dose-optimization program which includes automated exposure control, adjustment of the mA and/or kV according to patient size and/or use of iterative reconstruction technique. COMPARISON:  07/06/2022 FINDINGS: Brain: There is atrophy and chronic small vessel disease changes. No acute intracranial abnormality. Specifically, no hemorrhage, hydrocephalus, mass lesion, acute infarction, or significant intracranial injury. Vascular: No hyperdense vessel or unexpected calcification. Skull: No acute calvarial abnormality.  Sinuses/Orbits: No acute findings Other: None IMPRESSION: Atrophy, chronic microvascular disease. No acute intracranial abnormality. Electronically Signed   By: Charlett Nose M.D.   On: 03/17/2023 17:33    Procedures Procedures    Medications Ordered  in ED Medications  cefTRIAXone (ROCEPHIN) 1 g in sodium chloride 0.9 % 100 mL IVPB (0 g Intravenous Stopped 03/17/23 1849)    ED Course/ Medical Decision Making/ A&P                          Medical Decision Making Amount and/or Complexity of Data Reviewed Labs: ordered. Decision-making details documented in ED Course. Radiology: ordered.  Risk Prescription drug management.    This patient presents to the ED for concern of 2 falls today, this involves an extensive number of treatment options, and is a complaint that carries with it a high risk of complications and morbidity.  I considered the following differential and admission for this acute, potentially life threatening condition. Pt is overall HDS, well-appearing, and afebrile.  MDM:    DDX for trauma includes but is not limited to:  -Patient is NCAT on exam but states that she has amnesia to the second fall.  Unclear if she lost consciousness, hit her head, and patient takes a blood thinner.  Will obtain a CT head to be safe.  She has no midline C-spine tender palpation or movement of the neck, she does rule out by Nexus criteria for C-spine imaging. No chest or abdominal pain reported, no tenderness palpation on exam, very low concern for intrathoracic or intra-abdominal traumatic injury. -No pain in the extremities, no midline CT or L-spine tender palpation, no chest wall pain.  Very low concern for extremity fracture, vertebral fracture, or rib fracture.  -Will assess for underlying cause of falls such as electrolyte derangements, UTI. She has no upper respiratory symptoms to indicate PNA or covid. Consider polypharmacy as a possible cause of symptoms as well.  -Daughter and  patient both state that they have enough care for the patient at home.   Clinical Course as of 03/17/23 1950  Thu Mar 17, 2023  1727 BUN(!): 27 Consider possible dehydration w/ BUN:Cr 35, no electrolyte derangements.  [HN]  1728 WBC(!): 11.6 +Mild leukocytosis w/ left shift [HN]  1741 Urinalysis, Routine w reflex microscopic -Urine, Clean Catch(!) +UTI on UA. Does have some squamous cells but with leukocytosis w/ left shift and increased falls do believe this is UTI. Will treat w/ IV ceftriaxone and get culture. Has allergy listed to penicillins but has tolerated cephalosporins in the past. [HN]  1805 CXR and CTH without any acute abnormalities [HN]    Clinical Course User Index [HN] Loetta Rough, MD    Labs: I Ordered, and personally interpreted labs.  The pertinent results include:  those listed above  Imaging Studies ordered: I ordered imaging studies including CTH, CXR I independently visualized and interpreted imaging. I agree with the radiologist interpretation  Additional history obtained from chart review, daughter at bedside.    Social Determinants of Health: Lives with daughter  Disposition:  DC w/ discharge instructions/return precautions. DC with keflex x 5 days and PCP f/u in 1-2 weeks. All questions answered to patient's satisfaction.    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    "can't do either"   DVT (deep venous thrombosis), left "early 2000's"   RLE   Dysmetabolic syndrome X  Essential hypertension, benign    Headache(784.0) 05/2011   "pretty often since they put me on Parkinson's medicine"   History of shingles    "as a teen and in my 33's"   Mixed hyperlipidemia    Morbid obesity (HCC)     Neurodegenerative gait disorder    Personal history of thrombophlebitis    Syncope and collapse 12/06/11   "this am; I pass out fairly often"   Type II or unspecified type diabetes mellitus without mention of complication, not stated as uncontrolled 12/06/11   "not anymore; had lap band OR"     Medicines Meds ordered this encounter  Medications   cefTRIAXone (ROCEPHIN) 1 g in sodium chloride 0.9 % 100 mL IVPB    Order Specific Question:   Antibiotic Indication:    Answer:   UTI   cephALEXin (KEFLEX) 250 MG capsule    Sig: Take 1 capsule (250 mg total) by mouth 4 (four) times daily for 5 days.    Dispense:  20 capsule    Refill:  0    I have reviewed the patients home medicines and have made adjustments as needed  Problem List / ED Course: Problem List Items Addressed This Visit       Genitourinary   Acute cystitis without hematuria   Other Visit Diagnoses     Recurrent falls    -  Primary                   This note was created using dictation software, which may contain spelling or grammatical errors.    Loetta Rough, MD 03/17/23 [phone]

## 2023-03-17 NOTE — ED Triage Notes (Addendum)
Patient BIB EMS for 2 witnessed mechanical falls today. Patient denies dizziness, hitting head, and LOC. Patient denies any complaints. Not on blood thinners. Patient has hx of dementia and Bell's Palsy. Patient is at baseline per family.

## 2023-03-18 LAB — URINE CULTURE: Culture: <10000 — AB

## 2023-03-26 ENCOUNTER — Other Ambulatory Visit: Payer: Self-pay | Admitting: Primary Care

## 2023-03-26 DIAGNOSIS — E782 Mixed hyperlipidemia: Secondary | ICD-10-CM

## 2023-03-26 DIAGNOSIS — J309 Allergic rhinitis, unspecified: Secondary | ICD-10-CM

## 2023-03-26 NOTE — Telephone Encounter (Signed)
Patient is overdue for CPE/follow up, this will be required prior to any further refills.  Please schedule, thank you!   

## 2023-03-29 ENCOUNTER — Telehealth: Payer: Self-pay

## 2023-03-29 NOTE — Telephone Encounter (Signed)
Lvm for patient to cb and schedule 

## 2023-03-29 NOTE — Telephone Encounter (Signed)
Transition Care Management Unsuccessful Follow-up Telephone Call  Date of discharge and from where:  [facility] 8/22  Attempts:  1st Attempt  Reason for unsuccessful TCM follow-up call:  No answer/busy   Lenard Forth   [facility], [facility] Guide, Phone: [phone] Website: [email or web address]

## 2023-03-29 NOTE — Telephone Encounter (Signed)
Transition Care Management Follow-up Telephone Call Date of discharge and from where: [name] 8/22 How have you been since you were released from the hospital? Doing better and following up with PCP over the phone on Friday 9/6 Any questions or concerns? No  Items Reviewed: Did the pt receive and understand the discharge instructions provided? Yes  Medications obtained and verified? No  Other? No  Any new allergies since your discharge? No  Dietary orders reviewed? No Do you have support at home? Yes    Follow up appointments reviewed:  PCP Hospital f/u appt confirmed? Yes  Scheduled to see PCP  on 9/6 @ . Specialist Hospital f/u appt confirmed? No  Scheduled to see  on  @ . Are transportation arrangements needed? No  If their condition worsens, is the pt aware to call PCP or go to the Emergency Dept.? No Was the patient provided with contact information for the PCP's office or ED? No Was to pt encouraged to call back with questions or concerns? No

## 2023-03-30 ENCOUNTER — Other Ambulatory Visit: Payer: Self-pay | Admitting: Primary Care

## 2023-03-30 DIAGNOSIS — I10 Essential (primary) hypertension: Secondary | ICD-10-CM

## 2023-03-30 NOTE — Telephone Encounter (Signed)
Patient is due for CPE/follow up, this will be required prior to any further refills.  Please schedule, thank you!   

## 2023-03-31 NOTE — Telephone Encounter (Signed)
Lvmtcb, sent mychart message  

## 2023-04-06 ENCOUNTER — Other Ambulatory Visit: Payer: Self-pay | Admitting: Primary Care

## 2023-04-06 DIAGNOSIS — E1165 Type 2 diabetes mellitus with hyperglycemia: Secondary | ICD-10-CM

## 2023-04-11 ENCOUNTER — Other Ambulatory Visit: Payer: Self-pay | Admitting: Primary Care

## 2023-04-11 DIAGNOSIS — F341 Dysthymic disorder: Secondary | ICD-10-CM

## 2023-04-11 NOTE — Telephone Encounter (Signed)
Called patients daughter and scheduled CPE for 10.01.24.

## 2023-04-11 NOTE — Telephone Encounter (Signed)
Please call patient:  Needs office visit for CPE. Looks like we've tried sending my chart messages but she doesn't seem to have read them. Can we call?

## 2023-04-12 ENCOUNTER — Telehealth: Payer: Self-pay | Admitting: Neurology

## 2023-04-12 ENCOUNTER — Telehealth: Payer: Self-pay | Admitting: Primary Care

## 2023-04-12 DIAGNOSIS — F341 Dysthymic disorder: Secondary | ICD-10-CM

## 2023-04-12 MED ORDER — MIRTAZAPINE 30 MG PO TABS
30.0000 mg | ORAL_TABLET | Freq: Every day | ORAL | 0 refills | Status: DC
Start: 2023-04-12 — End: 2023-06-20

## 2023-04-12 NOTE — Telephone Encounter (Signed)
Looks like PCP is managing her Mirataztine.

## 2023-04-12 NOTE — Telephone Encounter (Signed)
Prescription Request  04/12/2023  LOV: 02/08/2023  What is the name of the medication or equipment? mirtazapine (REMERON) 30 MG tablet   Have you contacted your pharmacy to request a refill? No   Which pharmacy would you like this sent to?  [id number] [place],  - [address] [place] [address] Phone: [phone] Fax: [phone]    Patient notified that their request is being sent to the clinical staff for review and that they should receive a response within 2 business days.   Please advise at Mobile [phone] (mobile)

## 2023-04-12 NOTE — Telephone Encounter (Signed)
Refills sent to pharmacy. 

## 2023-04-12 NOTE — Addendum Note (Signed)
Addended by: Doreene Nest on: 04/12/2023 03:33 PM   Modules accepted: Orders

## 2023-04-12 NOTE — Telephone Encounter (Signed)
Pt daughter called and states that we decline to refill a medication due to the patient not having a upcoming appt.  When they were here last they made appt for her to follow up in 6 months  on 06-16-23. She would like  [facility] on [address] in [place]   She needs a refill on [employer]

## 2023-04-12 NOTE — Telephone Encounter (Signed)
Called patient and left a detailed message per DPR that Mirtazapine is managed by PCP, Vernona Rieger, NP and refills will need to be requested by her. Left detailed message incase there were any questions or concerns.

## 2023-04-24 IMAGING — DX DG CHEST 1V PORT
1 series · 1 of 1 positions shown · non-contrast
Comparison: 02/06/2017

CLINICAL DATA: Possible sepsis.

EXAM:
PORTABLE CHEST 1 VIEW

[chest ap]
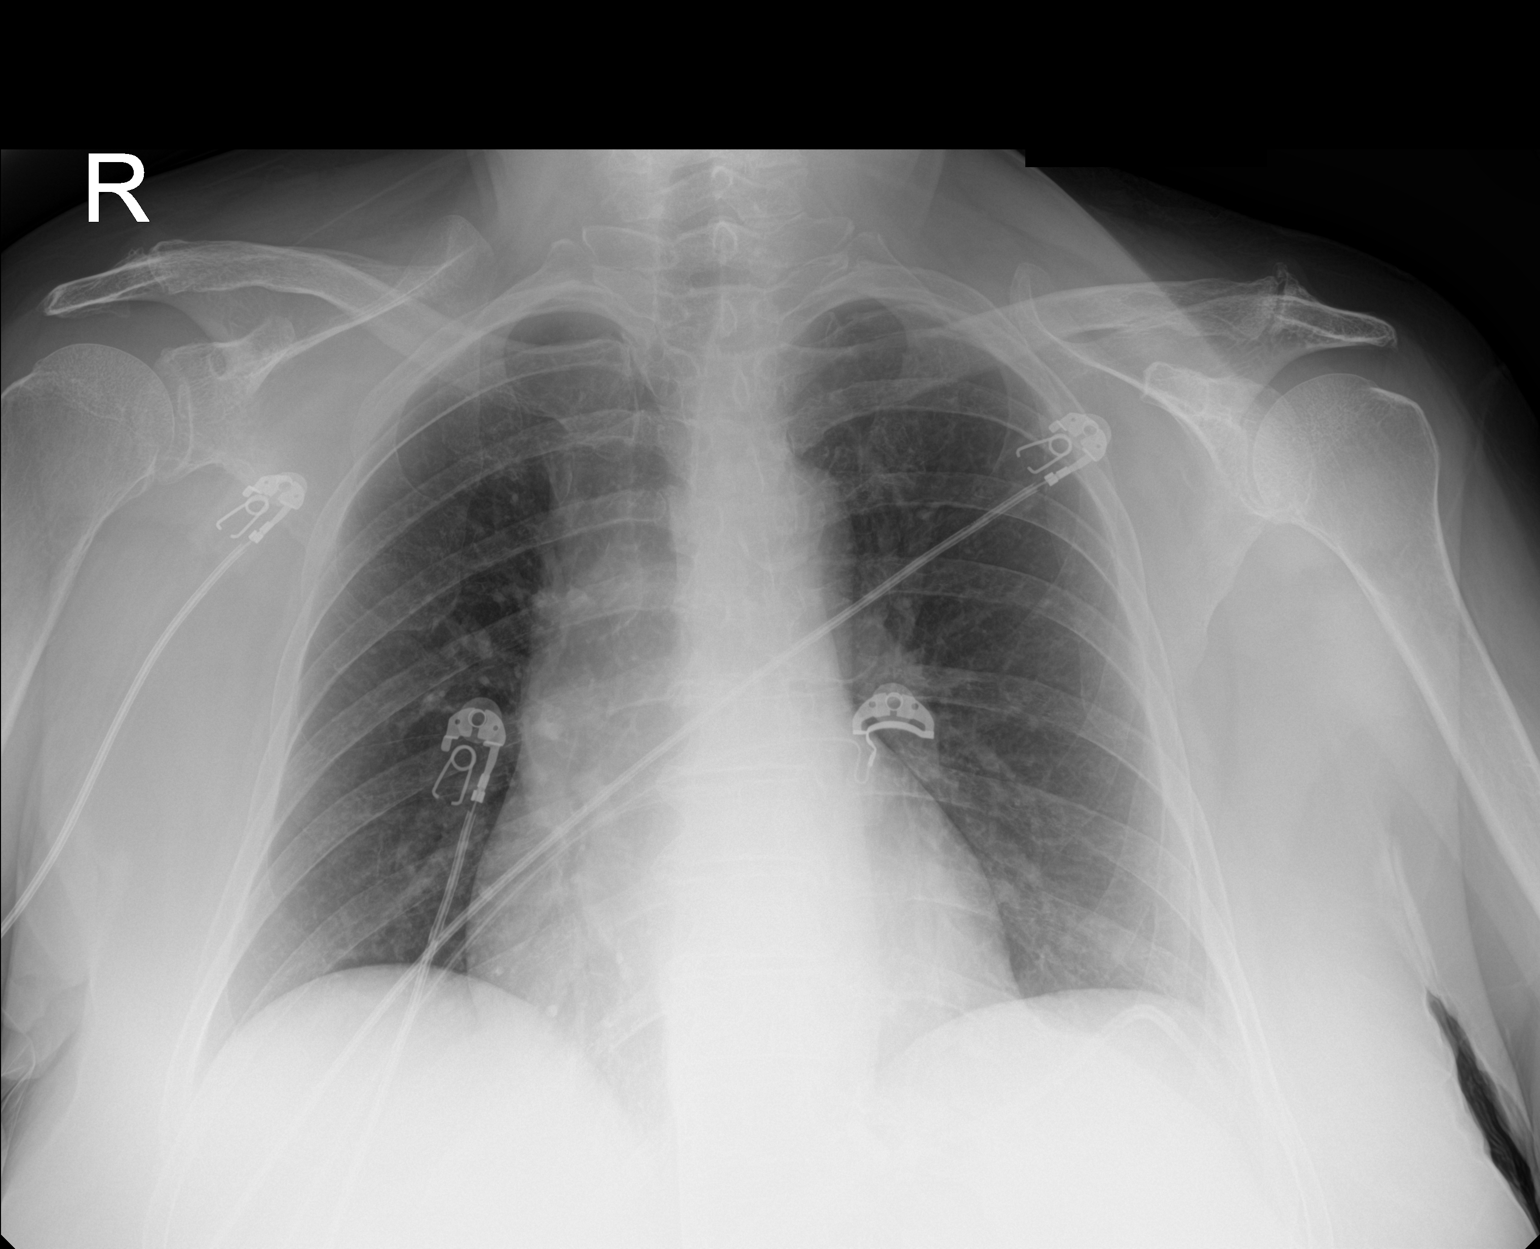

[1 of 1 positions shown; findings below may reference images not displayed]

FINDINGS: 0400 hours. The lungs are clear without focal pneumonia, edema,
pneumothorax or pleural effusion. The cardiopericardial silhouette
is within normal limits for size. The visualized bony structures of
the thorax show no acute abnormality. Telemetry leads overlie the
chest.
IMPRESSION: No active disease.

## 2023-04-26 ENCOUNTER — Ambulatory Visit (INDEPENDENT_AMBULATORY_CARE_PROVIDER_SITE_OTHER): Payer: Medicare Other | Admitting: Primary Care

## 2023-04-26 ENCOUNTER — Encounter: Payer: Self-pay | Admitting: Primary Care

## 2023-04-26 VITALS — BP 124/76 | HR 76 | Temp 97.2°F | Ht 62.0 in | Wt 155.0 lb

## 2023-04-26 DIAGNOSIS — Z23 Encounter for immunization: Secondary | ICD-10-CM | POA: Diagnosis not present

## 2023-04-26 DIAGNOSIS — Z1231 Encounter for screening mammogram for malignant neoplasm of breast: Secondary | ICD-10-CM

## 2023-04-26 DIAGNOSIS — E1165 Type 2 diabetes mellitus with hyperglycemia: Secondary | ICD-10-CM | POA: Diagnosis not present

## 2023-04-26 DIAGNOSIS — G214 Vascular parkinsonism: Secondary | ICD-10-CM | POA: Diagnosis not present

## 2023-04-26 DIAGNOSIS — F341 Dysthymic disorder: Secondary | ICD-10-CM

## 2023-04-26 DIAGNOSIS — E782 Mixed hyperlipidemia: Secondary | ICD-10-CM | POA: Diagnosis not present

## 2023-04-26 DIAGNOSIS — H6121 Impacted cerumen, right ear: Secondary | ICD-10-CM

## 2023-04-26 DIAGNOSIS — E2839 Other primary ovarian failure: Secondary | ICD-10-CM

## 2023-04-26 DIAGNOSIS — Z0001 Encounter for general adult medical examination with abnormal findings: Secondary | ICD-10-CM | POA: Diagnosis not present

## 2023-04-26 DIAGNOSIS — I10 Essential (primary) hypertension: Secondary | ICD-10-CM | POA: Diagnosis not present

## 2023-04-26 DIAGNOSIS — H6120 Impacted cerumen, unspecified ear: Secondary | ICD-10-CM | POA: Insufficient documentation

## 2023-04-26 DIAGNOSIS — M810 Age-related osteoporosis without current pathological fracture: Secondary | ICD-10-CM

## 2023-04-26 DIAGNOSIS — Z0000 Encounter for general adult medical examination without abnormal findings: Secondary | ICD-10-CM

## 2023-04-26 LAB — HEPATIC FUNCTION PANEL
ALT: 1 U/L (ref 0–35)
AST: 7 U/L (ref 0–37)
Albumin: 3.9 g/dL (ref 3.5–5.2)
Alkaline Phosphatase: 84 U/L (ref 39–117)
Bilirubin, Direct: 0.1 mg/dL (ref 0.0–0.3)
Total Bilirubin: 0.8 mg/dL (ref 0.2–1.2)
Total Protein: 6.3 g/dL (ref 6.0–8.3)

## 2023-04-26 LAB — MICROALBUMIN / CREATININE URINE RATIO
Creatinine,U: 180 mg/dL
Microalb Creat Ratio: 1.9 mg/g (ref 0.0–30.0)
Microalb, Ur: 3.4 mg/dL — ABNORMAL HIGH (ref 0.0–1.9)

## 2023-04-26 LAB — LIPID PANEL
Cholesterol: 119 mg/dL (ref 0–200)
HDL: 44.3 mg/dL (ref >39.00)
LDL Cholesterol: 34 mg/dL (ref 0–99)
NonHDL: 74.43
Total CHOL/HDL Ratio: 3
Triglycerides: 204 mg/dL — ABNORMAL HIGH (ref 0.0–149.0)
VLDL: 40.8 mg/dL — ABNORMAL HIGH (ref 0.0–40.0)

## 2023-04-26 LAB — HEMOGLOBIN A1C: Hgb A1c MFr Bld: 7.2 % — ABNORMAL HIGH (ref 4.6–6.5)

## 2023-04-26 NOTE — Assessment & Plan Note (Signed)
Overdue for follow up, also intolerant to metformin XR.  Remain off metformin XR.  Repeat A1C pending. Urine microalbumin due and pending.  Follow up in 3-6 months based on A1C result.

## 2023-04-26 NOTE — Assessment & Plan Note (Signed)
Repeat lipid panel pending. Continue rosuvastatin 5 mg daily.

## 2023-04-26 NOTE — Assessment & Plan Note (Signed)
Prolia now cost prohibitive. Repeat bone density scan pending.  Consider other options.

## 2023-04-26 NOTE — Assessment & Plan Note (Signed)
Immunizations UTD. Influenza vaccine provided today. Out of Prevnar 20 in the office, will update at next visit.  Mammogram and bone density scan due, orders placed. Colonoscopy N/A given age  Discussed the importance of a healthy diet and regular exercise in order for weight loss, and to reduce the risk of further co-morbidity.  Exam stable. Labs pending.  Follow up in 1 year for repeat physical.

## 2023-04-26 NOTE — Assessment & Plan Note (Signed)
Controlled.  Continue losartan-hydrochlorothiazide 50-12.5 mg daily. BMP reviewed from August 2024.

## 2023-04-26 NOTE — Progress Notes (Signed)
Subjective:    Patient ID: [name], female    DOB: 1946-06-17, 77 y.o.   MRN: [id number]  HPI  [name] is a very pleasant 77 y.o. female who presents today for complete physical and follow up of chronic conditions.  Immunizations: -Tetanus: Completed in 2013 -Influenza: Influenza vaccine provided today. -Shingles: Completed Shingrix series -Pneumonia: Completed Prevnar 13 in 2015 and pneumovax 23 in 2011  Diet: Fair diet.  Exercise: No regular exercise.   Eye exam: Completes annually  Dental exam: Completes semi-annually    Mammogram: October 2023 Bone Density Scan: Completed in 2021  Colonoscopy: Completed in 2020, no further imaging needed.  BP Readings from Last 3 Encounters:  04/26/23 124/76  03/17/23 121/62  02/08/23 110/70       Review of Systems  Constitutional:  Negative for unexpected weight change.  HENT:  Negative for rhinorrhea.   Respiratory:  Negative for cough and shortness of breath.   Cardiovascular:  Negative for chest pain.  Gastrointestinal:  Negative for constipation and diarrhea.  Genitourinary:  Negative for difficulty urinating and menstrual problem.  Musculoskeletal:  Negative for arthralgias and myalgias.  Skin:  Negative for rash.  Allergic/Immunologic: Negative for environmental allergies.  Neurological:  Negative for dizziness, numbness and headaches.  Psychiatric/Behavioral:  The patient is not nervous/anxious.          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    "can't do either"   DVT (deep venous thrombosis), left "early 2000's"   RLE   Dysmetabolic syndrome X    Essential hypertension, benign    Headache(784.0) 05/2011   "pretty often since they put me on Parkinson's  medicine"   History of shingles    "as a teen and in my 30's"   Mixed hyperlipidemia    Morbid obesity (HCC)    Neurodegenerative gait disorder    Personal history of thrombophlebitis    Syncope and collapse 12/06/2011   "this am; I pass out fairly often"   Type II or unspecified type diabetes mellitus without mention of complication, not stated as uncontrolled 12/06/2011   "not anymore; had lap band OR"   Urinary urgency 01/15/2022    Social History   Socioeconomic History   Marital status: Widowed    Spouse name: Not on file   Number of children: 2   Years of education: Not on file   Highest education level: Some college, no degree  Occupational History   Occupation: Restaurant manager, fast food: [employer]    Comment: retired, 04/2012  Tobacco Use   Smoking status: Never   Smokeless tobacco: Never  Vaping Use   Vaping status: Never Used  Substance and Sexual Activity   Alcohol use: No    Comment: none   Drug use: No   Sexual activity: Not Currently  Other Topics Concern   Not on file  Social History Narrative   Right handed   One story home   Lives with daughter and granddaughter.   Daughter is HPOA.   DNR.   Social Determinants of Health   Financial Resource Strain: Low Risk  (08/20/2021)   Overall Financial Resource Strain (CARDIA)    Difficulty of Paying  Living Expenses: Not hard at all  Food Insecurity: No Food Insecurity (08/20/2021)   Hunger Vital Sign    Worried About Running Out of Food in the Last Year: Never true    Ran Out of Food in the Last Year: Never true  Transportation Needs: No Transportation Needs (08/20/2021)   PRAPARE - Administrator, Civil Service (Medical): No    Lack of Transportation (Non-Medical): No  Physical Activity: Insufficiently Active (08/20/2021)   Exercise Vital Sign    Days of Exercise per Week: 3 days    Minutes of Exercise per Session: 20 min  Stress: No Stress Concern Present (08/20/2021)   [employer] of [employer]ational [employer] - Occupational Stress Questionnaire    Feeling of Stress : Not at all  Social Connections: Socially Isolated (08/20/2021)   Social Connection and Isolation Panel [NHANES]    Frequency of Communication with Friends and Family: Never    Frequency of Social Gatherings with Friends and Family: More than three times a week    Attends Religious Services: Never    Database administrator or Organizations: No    Attends Banker Meetings: Never    Marital Status: Widowed  Intimate Partner Violence: Not At Risk (08/20/2021)   Humiliation, Afraid, Rape, and Kick questionnaire    Fear of Current or Ex-Partner: No    Emotionally Abused: No    Physically Abused: No    Sexually Abused: No    Past Surgical History:  Procedure Laterality Date   bladder tack  11/1995   BREAST BIOPSY  05/20/2000   bilaterally   BREAST CYST ASPIRATION  12/1998   bilaterally   CARDIAC CATHETERIZATION  2004   CATARACT EXTRACTION W/ INTRAOCULAR LENS  IMPLANT, BILATERAL     FRACTURE SURGERY  02/09/2006   bilateral elbows   FRACTURE SURGERY     right knee   FRACTURE SURGERY     tib plateau   KNEE ARTHROSCOPY  12/12/2002   right    LAPAROSCOPIC GASTRIC BANDING  2008   SEPTOPLASTY  08/1998   with antral window    TONSILLECTOMY AND ADENOIDECTOMY  1970   TUBAL LIGATION  1970's    Family History  Problem Relation Age of Onset   Asthma Mother    COPD Father    Heart disease Father    Diabetes Sister    Vision loss Sister        legally blind   Hypertension Child    Hyperlipidemia Child    Diabetes Child    Diabetes Son    Breast cancer Neg Hx     Allergies  Allergen Reactions   Codeine Sulfate     REACTION: hallucinations   Penicillins Anaphylaxis, Swelling and Rash   Amoxapine And Related    Wellbutrin [Bupropion] Other (See Comments)    Seizures and hallucinations    Current Outpatient Medications on File Prior to Visit  Medication Sig Dispense Refill    ACCU-CHEK GUIDE test strip USE UP TO 4 TIMES DAILY AS DIRECTED 300 strip 1   Accu-Chek Softclix Lancets lancets USE UP TO 4 TIMES DAILY AS DIRECTED 200 each 1   aspirin 81 MG tablet Take 81 mg by mouth daily.     Bioflavonoid Products (GRAPE SEED PO) Take 1 tablet by mouth daily.      blood glucose meter kit and supplies KIT Dispense based on patient and insurance preference. Use up to four times daily as directed. (FOR ICD-9 250.00, 250.01).  1 each 0   calcium carbonate (TUMS EX) 750 MG chewable tablet Chew 1 tablet by mouth as needed.      Calcium Carbonate-Vitamin D (CALCIUM + D PO) Take 600 mg by mouth daily.     carbidopa-levodopa (SINEMET IR) 25-100 MG tablet TAKE 3 TABLETS BY MOUTH 3 TIMES DAILY. 810 tablet 0   cetirizine (ZYRTEC) 10 MG tablet Take 10 mg by mouth daily.     Cholecalciferol (VITAMIN D3) 1000 UNITS CAPS Take 1 capsule by mouth daily.     clonazePAM (KLONOPIN) 0.5 MG tablet TAKE 1/2 TABLET BY MOUTH AT BEDTIME AND 1/2 DAILY AS NEEDED 90 tablet 1   escitalopram (LEXAPRO) 10 MG tablet TAKE 1 TABLET BY MOUTH EVERY DAY 90 tablet 0   feeding supplement (ENSURE COMPLETE) LIQD Take 237 mLs by mouth 2 (two) times daily between meals. 1 Bottle 11   fluticasone (FLONASE) 50 MCG/ACT nasal spray Place 2 sprays into both nostrils daily.     gabapentin (NEURONTIN) 100 MG capsule TAKE 1 CAPSULE BY MOUTH THREE TIMES A DAY 270 capsule 0   losartan-hydrochlorothiazide (HYZAAR) 50-12.5 MG tablet TAKE 1 TABLET BY MOUTH EVERY DAY FOR BLOOD PRESSURE 30 tablet 0   memantine (NAMENDA TITRATION PACK) tablet pack 5 mg/day x 1 week; 5 mg twice bid x 1 week; 5 mg in AM, 10 mg in PM x 1 week; then 10 mg twice daily 1 tablet 0   memantine (NAMENDA) 10 MG tablet Take 1 tablet (10 mg total) by mouth 2 (two) times daily. 180 tablet 1   memantine (NAMENDA) 10 MG tablet Take 1 pill twice a day 180 tablet 0   memantine (NAMENDA) 10 MG tablet Take 1 tablet (10 mg total) by mouth 2 (two) times daily. 180 tablet  1   mirtazapine (REMERON) 30 MG tablet Take 1 tablet (30 mg total) by mouth at bedtime. For sleep. 90 tablet 0   montelukast (SINGULAIR) 10 MG tablet TAKE 1 TABLET (10 MG TOTAL) BY MOUTH AT BEDTIME. FOR ALLERGIES 30 tablet 0   Multiple Vitamins-Minerals (CENTRUM SILVER ADULT 50+) TABS Take by mouth.     naproxen sodium (ALEVE) 220 MG tablet Take 220 mg by mouth in the morning and at bedtime.     rosuvastatin (CRESTOR) 5 MG tablet TAKE 1 TABLET (5 MG TOTAL) BY MOUTH EVERY EVENING. FOR CHOLESTEROL. 30 tablet 0   vitamin B-12 (CYANOCOBALAMIN) 1000 MCG tablet Take 1,000 mcg by mouth daily.     [DISCONTINUED] simvastatin (ZOCOR) 20 MG tablet TAKE 1 TABLET BY MOUTH EVERY DAY AT BEDTIME FOR CHOLESTEROL 90 tablet 0   No current facility-administered medications on file prior to visit.    BP 124/76   Pulse 76   Temp (!) 97.2 F (36.2 C) (Temporal)   Ht 5\' 2"  (1.575 m)   Wt 155 lb (70.3 kg)   SpO2 98%   BMI 28.35 kg/m  Objective:   Physical Exam HENT:     Right Ear: Tympanic membrane and ear canal normal. There is impacted cerumen.     Left Ear: Tympanic membrane and ear canal normal.  Eyes:     Pupils: Pupils are equal, round, and reactive to light.  Cardiovascular:     Rate and Rhythm: Normal rate and regular rhythm.  Pulmonary:     Effort: Pulmonary effort is normal.     Breath sounds: Normal breath sounds.  Abdominal:     General: Bowel sounds are normal.     Palpations: Abdomen is soft.  Tenderness: There is no abdominal tenderness.  Musculoskeletal:        General: Normal range of motion.     Cervical back: Neck supple.  Skin:    General: Skin is warm and dry.  Neurological:     Mental Status: She is alert and oriented to person, place, and time.     Cranial Nerves: No cranial nerve deficit.     Deep Tendon Reflexes:     Reflex Scores:      Patellar reflexes are 2+ on the right side and 2+ on the left side. Psychiatric:        Mood and Affect: Mood normal.            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  Continue carbidopa levodopa 25-100 mg, 3 tabs 3 times daily, memantine 10 mg twice daily, gabapentin 100 mg 3 times daily, clonazepam 0.5 mg as needed.   HYPERLIPIDEMIA, MIXED Assessment & Plan: Repeat lipid panel pending. Continue rosuvastatin 5 mg daily.  Orders: -     Lipid panel  Essential hypertension, benign Assessment & Plan: Controlled.  Continue losartan-hydrochlorothiazide 50-12.5 mg daily. BMP reviewed from August 2024.  Orders: -     Hepatic function panel  Type 2 diabetes mellitus with hyperglycemia, without long-term current use of insulin (HCC) Assessment & Plan: Overdue for follow up, also intolerant to metformin XR.  Remain off metformin XR.  Repeat A1C pending. Urine microalbumin due and pending.  Follow up in 3-6 months based on A1C result.  Orders: -     Microalbumin / creatinine urine ratio -     Hepatic function panel -     Hemoglobin A1c  Screening mammogram for breast cancer -     3D Screening Mammogram, Left and Right; Future  Estrogen deficiency -     DG Bone Density; Future  Impacted cerumen of right ear Assessment & Plan: Right cerumen impaction identified on exam. Patient consented to irrigation of canals bilaterally.  Right canal irrigated. Patient tolerated well. TM's and canals post irrigation unremarkable.   Discussed home care instructions.     Age-related osteoporosis without current pathological fracture Assessment & Plan: Prolia now cost prohibitive. Repeat bone density scan pending.  Consider other options.   ANXIETY DEPRESSION Assessment & Plan: Improved.  Continue Lexapro 10 mg daily and mirtazipine 30 mg HS   Preventative health care Assessment & Plan: Immunizations UTD. Influenza vaccine provided today. Out of Prevnar 20 in the office, will update at  next visit.  Mammogram and bone density scan due, orders placed. Colonoscopy N/A given age  Discussed the importance of a healthy diet and regular exercise in order for weight loss, and to reduce the risk of further co-morbidity.  Exam stable. Labs pending.  Follow up in 1 year for repeat physical.          Doreene Nest, NP

## 2023-04-26 NOTE — Assessment & Plan Note (Addendum)
Right cerumen impaction identified on exam. Patient consented to irrigation of canals bilaterally.  Right canal irrigated. Patient tolerated well. TM's and canals post irrigation unremarkable.   Discussed home care instructions.   

## 2023-04-26 NOTE — Assessment & Plan Note (Addendum)
Following with neurology, office notes reviewed from May 2024.  Continue carbidopa levodopa 25-100 mg, 3 tabs 3 times daily, memantine 10 mg twice daily, gabapentin 100 mg 3 times daily, clonazepam 0.5 mg as needed.

## 2023-04-26 NOTE — Assessment & Plan Note (Signed)
Improved.  Continue Lexapro 10 mg daily and mirtazipine 30 mg HS

## 2023-04-26 NOTE — Patient Instructions (Signed)
Stop by the lab prior to leaving today. I will notify you of your results once received.   [facility] your mamm[facility] and bone density scan.   It was a pleasure to see you today!   

## 2023-04-27 ENCOUNTER — Emergency Department (HOSPITAL_COMMUNITY): Payer: Medicare Other

## 2023-04-27 ENCOUNTER — Encounter (HOSPITAL_COMMUNITY): Payer: Self-pay

## 2023-04-27 ENCOUNTER — Emergency Department (HOSPITAL_COMMUNITY)
Admission: EM | Admit: 2023-04-27 | Discharge: 2023-04-27 | Disposition: A | Discharge status: Home / Self Care | Payer: Medicare Other | Attending: Emergency Medicine | Admitting: Emergency Medicine

## 2023-04-27 ENCOUNTER — Other Ambulatory Visit: Payer: Self-pay

## 2023-04-27 DIAGNOSIS — I10 Essential (primary) hypertension: Secondary | ICD-10-CM | POA: Insufficient documentation

## 2023-04-27 DIAGNOSIS — M79661 Pain in right lower leg: Secondary | ICD-10-CM

## 2023-04-27 DIAGNOSIS — F0390 Unspecified dementia without behavioral disturbance: Secondary | ICD-10-CM | POA: Insufficient documentation

## 2023-04-27 DIAGNOSIS — E119 Type 2 diabetes mellitus without complications: Secondary | ICD-10-CM | POA: Diagnosis not present

## 2023-04-27 DIAGNOSIS — M25561 Pain in right knee: Secondary | ICD-10-CM

## 2023-04-27 DIAGNOSIS — Z743 Need for continuous supervision: Secondary | ICD-10-CM | POA: Diagnosis not present

## 2023-04-27 DIAGNOSIS — Z79899 Other long term (current) drug therapy: Secondary | ICD-10-CM | POA: Diagnosis not present

## 2023-04-27 DIAGNOSIS — R6889 Other general symptoms and signs: Secondary | ICD-10-CM | POA: Diagnosis not present

## 2023-04-27 DIAGNOSIS — G20C Parkinsonism, unspecified: Secondary | ICD-10-CM | POA: Insufficient documentation

## 2023-04-27 DIAGNOSIS — M179 Osteoarthritis of knee, unspecified: Secondary | ICD-10-CM | POA: Diagnosis not present

## 2023-04-27 DIAGNOSIS — M1711 Unilateral primary osteoarthritis, right knee: Secondary | ICD-10-CM | POA: Diagnosis not present

## 2023-04-27 DIAGNOSIS — M25562 Pain in left knee: Secondary | ICD-10-CM | POA: Insufficient documentation

## 2023-04-27 DIAGNOSIS — Z7982 Long term (current) use of aspirin: Secondary | ICD-10-CM | POA: Insufficient documentation

## 2023-04-27 DIAGNOSIS — M25461 Effusion, right knee: Secondary | ICD-10-CM | POA: Diagnosis not present

## 2023-04-27 DIAGNOSIS — M1990 Unspecified osteoarthritis, unspecified site: Secondary | ICD-10-CM

## 2023-04-27 MED ORDER — IBUPROFEN 200 MG PO TABS
600.0000 mg | ORAL_TABLET | Freq: Once | ORAL | Status: AC
  Administered 2023-04-27: 600 mg via ORAL
  Filled 2023-04-27: qty 3

## 2023-04-27 NOTE — Discharge Instructions (Addendum)
It was a pleasure caring for you today.  X-ray showed your arthritis.  I recommend following up with your primary care provider.  Seek emergency care if experiencing any new or worsening symptoms.  Alternating between 650 mg Tylenol and 400 mg Advil: The best way to alternate taking Acetaminophen (example Tylenol) and Ibuprofen (example Advil/Motrin) is to take them 3 hours apart. For example, if you take ibuprofen at 6 am you can then take Tylenol at 9 am. You can continue this regimen throughout the day, making sure you do not exceed the recommended maximum dose for each drug.

## 2023-04-27 NOTE — Progress Notes (Signed)
Orthopedic Tech Progress Note Patient Details:  [name] 07-Jan-1946 [id number]  Ortho Devices Type of Ortho Device: Knee Sleeve Ortho Device/Splint Location: right Ortho Device/Splint Interventions: Ordered, Application, Adjustment   Post Interventions Patient Tolerated: Well Instructions Provided: Adjustment of device, Care of device  Kizzie Fantasia 04/27/2023, 8:16 PM

## 2023-04-27 NOTE — ED Triage Notes (Signed)
Patient reports sudden onset of left knee pain. No injuries. Patient has hx of dementia and is poor historian.

## 2023-04-27 NOTE — Progress Notes (Signed)
Right lower extremity venous duplex has been completed. Preliminary results can be found in CV Proc through chart review.  Results were given to [facility] PA.  04/27/23 7:38 PM Olen Cordial RVT

## 2023-04-27 NOTE — ED Provider Notes (Signed)
[place] EMERGENCY DEPARTMENT AT [facility] Provider Note   CSN: [id number] Arrival date & time: 04/27/23  1812     History  Chief Complaint  Patient presents with   Knee Pain    [name] is a 77 y.o. female with PMHx DM, Bell's palsy, dementia, Parkinson's, HTN, HLD who presents to the ED concerned for right knee pain. Patient was sleeping on couch earlier today when she woke up and started crying in pain. PMHx of blood clots in this leg many years ago.  Denies fever, chest pain, dyspnea, cough, nausea, vomiting.    Knee Pain      Home Medications Prior to Admission medications   Medication Sig Start Date End Date Taking? Authorizing Provider  ACCU-CHEK GUIDE test strip USE UP TO 4 TIMES DAILY AS DIRECTED 11/14/22   Doreene Nest, NP  Accu-Chek Softclix Lancets lancets USE UP TO 4 TIMES DAILY AS DIRECTED 07/25/22   Doreene Nest, NP  aspirin 81 MG tablet Take 81 mg by mouth daily.    [provider]  Bioflavonoid Products (GRAPE SEED PO) Take 1 tablet by mouth daily.     [provider]  blood glucose meter kit and supplies KIT Dispense based on patient and insurance preference. Use up to four times daily as directed. (FOR ICD-9 250.00, 250.01). 04/15/22   Doreene Nest, NP  calcium carbonate (TUMS EX) 750 MG chewable tablet Chew 1 tablet by mouth as needed.     [provider]  Calcium Carbonate-Vitamin D (CALCIUM + D PO) Take 600 mg by mouth daily.    [provider]  carbidopa-levodopa (SINEMET IR) 25-100 MG tablet TAKE 3 TABLETS BY MOUTH 3 TIMES DAILY. 01/07/23   Tat, Octaviano Batty, DO  cetirizine (ZYRTEC) 10 MG tablet Take 10 mg by mouth daily.    [provider]  Cholecalciferol (VITAMIN D3) 1000 UNITS CAPS Take 1 capsule by mouth daily.    [provider]  clonazePAM (KLONOPIN) 0.5 MG tablet TAKE 1/2 TABLET BY MOUTH AT BEDTIME AND 1/2 DAILY AS NEEDED 12/09/22   Tat, Octaviano Batty, DO   escitalopram (LEXAPRO) 10 MG tablet TAKE 1 TABLET BY MOUTH EVERY DAY 01/07/23   Tat, Octaviano Batty, DO  feeding supplement (ENSURE COMPLETE) LIQD Take 237 mLs by mouth 2 (two) times daily between meals. 04/01/12   Alison Murray, MD  fluticasone [facility]) 50 MCG/ACT nasal spray Place 2 sprays into both nostrils daily. 10/02/20   [provider]  gabapentin (NEURONTIN) 100 MG capsule TAKE 1 CAPSULE BY MOUTH THREE TIMES A DAY 03/01/23   Tat, Octaviano Batty, DO  losartan-hydrochlorothiazide (HYZAAR) 50-12.5 MG tablet TAKE 1 TABLET BY MOUTH EVERY DAY FOR BLOOD PRESSURE 03/30/23   Doreene Nest, NP  memantine [facility] TITRATION PACK) tablet pack 5 mg/day x 1 week; 5 mg twice bid x 1 week; 5 mg in AM, 10 mg in PM x 1 week; then 10 mg twice daily 11/30/22   Tat, Octaviano Batty, DO  memantine (NAMENDA) 10 MG tablet Take 1 tablet (10 mg total) by mouth 2 (two) times daily. 11/30/22   Tat, Octaviano Batty, DO  memantine (NAMENDA) 10 MG tablet Take 1 pill twice a day 12/28/22   Tat, Octaviano Batty, DO  memantine (NAMENDA) 10 MG tablet Take 1 tablet (10 mg total) by mouth 2 (two) times daily. 01/07/23   Tat, Octaviano Batty, DO  mirtazapine (REMERON) 30 MG tablet Take 1 tablet (30 mg total) by  mouth at bedtime. For sleep. 04/12/23   Doreene Nest, NP  montelukast (SINGULAIR) 10 MG tablet TAKE 1 TABLET (10 MG TOTAL) BY MOUTH AT BEDTIME. FOR ALLERGIES 03/26/23   Doreene Nest, NP  Multiple Vitamins-Minerals (CENTRUM SILVER ADULT 50+) TABS Take by mouth.    [provider]  naproxen sodium (ALEVE) 220 MG tablet Take 220 mg by mouth in the morning and at bedtime.    [provider]  rosuvastatin (CRESTOR) 5 MG tablet TAKE 1 TABLET (5 MG TOTAL) BY MOUTH EVERY EVENING. FOR CHOLESTEROL. 03/26/23   Doreene Nest, NP  vitamin B-12 (CYANOCOBALAMIN) 1000 MCG tablet Take 1,000 mcg by mouth daily.    [provider]  simvastatin (ZOCOR) 20 MG tablet TAKE 1 TABLET BY MOUTH EVERY DAY AT BEDTIME FOR CHOLESTEROL 10/01/19  01/02/20  Doreene Nest, NP      Allergies    Codeine sulfate, Penicillins, Amoxapine and related, and Wellbutrin [bupropion]    Review of Systems   Review of Systems  Musculoskeletal:        Knee pain    Physical Exam Updated Vital Signs BP (!) 154/81   Pulse 69   Temp 98.5 F (36.9 C) (Oral)   Resp 16   Ht 5\' 2"  (1.575 m)   Wt 70.3 kg   SpO2 94%   BMI 28.35 kg/m  Physical Exam Vitals and nursing note reviewed.  Constitutional:      General: She is not in acute distress.    Appearance: She is not ill-appearing or toxic-appearing.  HENT:     Head: Normocephalic and atraumatic.  Eyes:     General: No scleral icterus.       Right eye: No discharge.        Left eye: No discharge.     Conjunctiva/sclera: Conjunctivae normal.  Cardiovascular:     Rate and Rhythm: Normal rate.  Pulmonary:     Effort: Pulmonary effort is normal.  Abdominal:     General: Abdomen is flat.  Musculoskeletal:     Comments: Right knee: +2 pedal pulses; sensation to light touch intact; no erythema, increased warmth, or swelling; Active ROM intact.   Skin:    General: Skin is warm and dry.  Neurological:     General: No focal deficit present.     Mental Status: She is alert. Mental status is at baseline.  Psychiatric:        Mood and Affect: Mood normal.        Behavior: Behavior normal.     ED Results / Procedures / Treatments   Labs (all labs ordered are listed, but only abnormal results are displayed) Labs Reviewed - No data to display  EKG None  Radiology DG Knee Complete 4 Views Right  Result Date: 04/27/2023 CLINICAL DATA:  Right lateral knee pain after a series of falls EXAM: RIGHT KNEE - COMPLETE 4+ VIEW COMPARISON:  03/21/2022 FINDINGS: No acute fracture or dislocation. Degenerative arthritis right knee with advanced lateral compartment narrowing. Trace knee joint effusion. Soft tissues are unremarkable. IMPRESSION: No acute fracture or dislocation. Right knee  osteoarthritis with advanced lateral compartment narrowing. Electronically Signed   By: Minerva Fester M.D.   On: 04/27/2023 19:45   VAS [place] LOWER EXTREMITY VENOUS (DVT) (7a-7p)  Result Date: 04/27/2023  Lower Venous DVT Study Patient Name:  AARA JACQUOT  Date of Exam:   04/27/2023 Medical Rec #: [id number]         Accession #:  [id number] Date of Birth: 10-Jun-1946         Patient Gender: F Patient Age:   86 years Exam Location:  [facility] Procedure:      VAS [place] LOWER EXTREMITY VENOUS (DVT) Referring Phys: [facility] Cyara Devoto --------------------------------------------------------------------------------  Indications: Pain.  Risk Factors: None identified. Limitations: Poor ultrasound/tissue interface. Comparison Study: No prior studies. Performing Technologist: Chanda Busing RVT  Examination Guidelines: A complete evaluation includes B-mode imaging, spectral Doppler, color Doppler, and power Doppler as needed of all accessible portions of each vessel. Bilateral testing is considered an integral part of a complete examination. Limited examinations for reoccurring indications may be performed as noted. The reflux portion of the exam is performed with the patient in reverse Trendelenburg.  +---------+---------------+---------+-----------+----------+--------------+ RIGHT    CompressibilityPhasicitySpontaneityPropertiesThrombus Aging +---------+---------------+---------+-----------+----------+--------------+ CFV      Full           Yes      Yes                                 +---------+---------------+---------+-----------+----------+--------------+ SFJ      Full                                                        +---------+---------------+---------+-----------+----------+--------------+ FV Prox  Full                                                        +---------+---------------+---------+-----------+----------+--------------+ FV Mid   Full                                                         +---------+---------------+---------+-----------+----------+--------------+ FV DistalFull                                                        +---------+---------------+---------+-----------+----------+--------------+ PFV      Full                                                        +---------+---------------+---------+-----------+----------+--------------+ POP      Full           Yes      Yes                                 +---------+---------------+---------+-----------+----------+--------------+ PTV      Full                                                        +---------+---------------+---------+-----------+----------+--------------+  PERO     Full                                                        +---------+---------------+---------+-----------+----------+--------------+   +----+---------------+---------+-----------+----------+--------------+ LEFTCompressibilityPhasicitySpontaneityPropertiesThrombus Aging +----+---------------+---------+-----------+----------+--------------+ CFV Full           Yes      Yes                                 +----+---------------+---------+-----------+----------+--------------+    Summary: RIGHT: - There is no evidence of deep vein thrombosis in the lower extremity.  - No cystic structure found in the popliteal fossa.  LEFT: - No evidence of common femoral vein obstruction.   *See table(s) above for measurements and observations.    Preliminary     Procedures Procedures    Medications Ordered in ED Medications  ibuprofen (ADVIL) tablet 600 mg (600 mg Oral Given 04/27/23 1915)    ED Course/ Medical Decision Making/ A&P                                 Medical Decision Making Amount and/or Complexity of Data Reviewed Radiology: ordered.   This patient presents to the ED for concern of right knee pain, this involves an extensive number of treatment options, and is a  complaint that carries with it a high risk of complications and morbidity.  The differential diagnosis includes hemarthrosis, gout, septic joint, fracture, compartment syndrome   Co morbidities that complicate the patient evaluation  DM, Bell's palsy, dementia, Parkinson's, HTN, HLD    Additional history obtained:  Additional history obtained from previous labs 03/17/2023 BMP reassuring   Imaging Studies ordered:  I ordered imaging studies including  - knee xray: to assess for process contributing to patient's symptoms I independently visualized and interpreted imaging Shared findings with patient I agree with the radiologist interpretation   Problem List / ED Course / Critical interventions / Medication management  Patient presents to ED concerned with right knee pain.  Does not remember any recent trauma to his knee.  No hx of gout. Physical exam unremarkable.  Patient is afebrile with stable vitals.  Denying any other infectious symptoms today.  Vascular extremity without concern for DVT.  Right knee x-ray showing patient's significant arthritis - no acute concerns.  Provided patient with Advil in ED which provided some relief. Will provide patient with knee brace for further symptom management.  Recommend following up with PCP.  Patient and family member at bedside endorsed understanding of plan. I have reviewed the patients home medicines and have made adjustments as needed. Patient afebrile with stable vitals.  Provided with return precautions.  Discharged in good condition.   Ddx these are considered less likely due to history of present illness and physical exam -hemarthrosis: joint without swelling; ROM intact -gout: no warmth or erythema; ROM intact  -septic joint: afebrile; no warmth or erythema; no skin changes; ROM intact  -fracture: xray without concern  -compartment syndrome: area not tense; neurovascularly intact   Social Determinants of  Health:  none          Final Clinical Impression(s) / ED Diagnoses Final diagnoses:  Arthritis  Acute pain of right  knee    Rx / DC Orders ED Discharge Orders     None         Margarita Rana 04/27/23 Nolon Bussing, MD 04/28/23 [phone]

## 2023-04-30 ENCOUNTER — Other Ambulatory Visit: Payer: Self-pay | Admitting: Primary Care

## 2023-04-30 DIAGNOSIS — E782 Mixed hyperlipidemia: Secondary | ICD-10-CM

## 2023-05-01 ENCOUNTER — Other Ambulatory Visit: Payer: Self-pay | Admitting: Primary Care

## 2023-05-01 DIAGNOSIS — I10 Essential (primary) hypertension: Secondary | ICD-10-CM

## 2023-05-01 DIAGNOSIS — J309 Allergic rhinitis, unspecified: Secondary | ICD-10-CM

## 2023-05-03 DIAGNOSIS — H903 Sensorineural hearing loss, bilateral: Secondary | ICD-10-CM | POA: Diagnosis not present

## 2023-05-03 HISTORY — DX: Sensorineural hearing loss, bilateral: H90.3

## 2023-05-24 ENCOUNTER — Other Ambulatory Visit: Payer: Self-pay | Admitting: Neurology

## 2023-05-24 DIAGNOSIS — G20A1 Parkinson's disease without dyskinesia, without mention of fluctuations: Secondary | ICD-10-CM

## 2023-05-28 ENCOUNTER — Encounter (HOSPITAL_COMMUNITY): Payer: Self-pay

## 2023-05-28 ENCOUNTER — Emergency Department (HOSPITAL_COMMUNITY)
Admission: EM | Admit: 2023-05-28 | Discharge: 2023-05-29 | Disposition: A | Discharge status: Home / Self Care | Payer: Medicare Other | Attending: Emergency Medicine | Admitting: Emergency Medicine

## 2023-05-28 ENCOUNTER — Other Ambulatory Visit: Payer: Self-pay

## 2023-05-28 ENCOUNTER — Emergency Department (HOSPITAL_COMMUNITY): Payer: Medicare Other

## 2023-05-28 DIAGNOSIS — R55 Syncope and collapse: Secondary | ICD-10-CM | POA: Insufficient documentation

## 2023-05-28 DIAGNOSIS — R6889 Other general symptoms and signs: Secondary | ICD-10-CM | POA: Diagnosis not present

## 2023-05-28 DIAGNOSIS — Z7982 Long term (current) use of aspirin: Secondary | ICD-10-CM | POA: Insufficient documentation

## 2023-05-28 DIAGNOSIS — K449 Diaphragmatic hernia without obstruction or gangrene: Secondary | ICD-10-CM | POA: Diagnosis not present

## 2023-05-28 DIAGNOSIS — N289 Disorder of kidney and ureter, unspecified: Secondary | ICD-10-CM | POA: Diagnosis not present

## 2023-05-28 DIAGNOSIS — R410 Disorientation, unspecified: Secondary | ICD-10-CM | POA: Diagnosis not present

## 2023-05-28 DIAGNOSIS — R109 Unspecified abdominal pain: Secondary | ICD-10-CM | POA: Diagnosis not present

## 2023-05-28 DIAGNOSIS — R29810 Facial weakness: Secondary | ICD-10-CM | POA: Insufficient documentation

## 2023-05-28 DIAGNOSIS — Z743 Need for continuous supervision: Secondary | ICD-10-CM | POA: Diagnosis not present

## 2023-05-28 DIAGNOSIS — R42 Dizziness and giddiness: Secondary | ICD-10-CM | POA: Diagnosis not present

## 2023-05-28 DIAGNOSIS — R404 Transient alteration of awareness: Secondary | ICD-10-CM | POA: Diagnosis not present

## 2023-05-28 LAB — CBG MONITORING, ED: Glucose-Capillary: 177 mg/dL — ABNORMAL HIGH (ref 70–99)

## 2023-05-28 NOTE — ED Provider Notes (Signed)
[place] EMERGENCY DEPARTMENT AT [facility] Provider Note   CSN: [id number] Arrival date & time: 05/28/23  2249     History {Add pertinent medical, surgical, social history, OB history to HPI:1} Chief Complaint  Patient presents with   Near Syncope    [name] is a 77 y.o. female.  The history is provided by the patient.  Near Syncope  [name] is a 77 y.o. female who presents to the Emergency Department complaining of near syncope.  She presents to the emergency department by EMS for evaluation following a near syncopal event.  She was straining to have a bowel movement when she became lightheaded.  She was assisted to a chair.  She did not fully lose consciousness.  On EMS arrival she was found to have a blood pressure of 60 systolic.  It did improve without intervention.  She was hypotensive for approximately 10 minutes.  She denies any fevers, chest pain, abdominal pain, nausea, vomiting, hematochezia or melena.  No complaints at time of ED arrival.     Home Medications Prior to Admission medications   Medication Sig Start Date End Date Taking? Authorizing Provider  ACCU-CHEK GUIDE test strip USE UP TO 4 TIMES DAILY AS DIRECTED 11/14/22   Doreene Nest, NP  Accu-Chek Softclix Lancets lancets USE UP TO 4 TIMES DAILY AS DIRECTED 07/25/22   Doreene Nest, NP  aspirin 81 MG tablet Take 81 mg by mouth daily.    [provider]  Bioflavonoid Products (GRAPE SEED PO) Take 1 tablet by mouth daily.     [provider]  blood glucose meter kit and supplies KIT Dispense based on patient and insurance preference. Use up to four times daily as directed. (FOR ICD-9 250.00, 250.01). 04/15/22   Doreene Nest, NP  calcium carbonate (TUMS EX) 750 MG chewable tablet Chew 1 tablet by mouth as needed.     [provider]  Calcium Carbonate-Vitamin D (CALCIUM + D PO) Take 600 mg by mouth daily.    [provider]   carbidopa-levodopa (SINEMET IR) 25-100 MG tablet TAKE 3 TABLETS BY MOUTH 3 TIMES DAILY. 05/24/23   Tat, Octaviano Batty, DO  cetirizine (ZYRTEC) 10 MG tablet Take 10 mg by mouth daily.    [provider]  Cholecalciferol (VITAMIN D3) 1000 UNITS CAPS Take 1 capsule by mouth daily.    [provider]  clonazePAM (KLONOPIN) 0.5 MG tablet TAKE 1/2 TABLET BY MOUTH AT BEDTIME AND 1/2 DAILY AS NEEDED 12/09/22   Tat, Octaviano Batty, DO  escitalopram (LEXAPRO) 10 MG tablet TAKE 1 TABLET BY MOUTH EVERY DAY 01/07/23   Tat, Octaviano Batty, DO  feeding supplement (ENSURE COMPLETE) LIQD Take 237 mLs by mouth 2 (two) times daily between meals. 04/01/12   Alison Murray, MD  fluticasone [facility]) 50 MCG/ACT nasal spray Place 2 sprays into both nostrils daily. 10/02/20   [provider]  gabapentin (NEURONTIN) 100 MG capsule TAKE 1 CAPSULE BY MOUTH THREE TIMES A DAY 03/01/23   Tat, Octaviano Batty, DO  losartan-hydrochlorothiazide (HYZAAR) 50-12.5 MG tablet TAKE 1 TABLET BY MOUTH EVERY DAY FOR BLOOD PRESSURE 05/01/23   Doreene Nest, NP  memantine [facility] TITRATION PACK) tablet pack 5 mg/day x 1 week; 5 mg twice bid x 1 week; 5 mg in AM, 10 mg in PM x 1 week; then 10 mg twice daily 11/30/22   Tat, Octaviano Batty, DO  memantine (NAMENDA) 10 MG tablet Take 1 tablet (  10 mg total) by mouth 2 (two) times daily. 11/30/22   Tat, Octaviano Batty, DO  memantine (NAMENDA) 10 MG tablet Take 1 pill twice a day 12/28/22   Tat, Octaviano Batty, DO  memantine (NAMENDA) 10 MG tablet Take 1 tablet (10 mg total) by mouth 2 (two) times daily. 01/07/23   Tat, Octaviano Batty, DO  mirtazapine (REMERON) 30 MG tablet Take 1 tablet (30 mg total) by mouth at bedtime. For sleep. 04/12/23   Doreene Nest, NP  montelukast (SINGULAIR) 10 MG tablet TAKE 1 TABLET (10 MG TOTAL) BY MOUTH AT BEDTIME. FOR ALLERGIES 05/01/23   Doreene Nest, NP  Multiple Vitamins-Minerals (CENTRUM SILVER ADULT 50+) TABS Take by mouth.    [provider]  naproxen sodium  (ALEVE) 220 MG tablet Take 220 mg by mouth in the morning and at bedtime.    [provider]  rosuvastatin (CRESTOR) 5 MG tablet TAKE 1 TABLET (5 MG TOTAL) BY MOUTH EVERY EVENING. FOR CHOLESTEROL. 05/01/23   Doreene Nest, NP  vitamin B-12 (CYANOCOBALAMIN) 1000 MCG tablet Take 1,000 mcg by mouth daily.    [provider]  simvastatin (ZOCOR) 20 MG tablet TAKE 1 TABLET BY MOUTH EVERY DAY AT BEDTIME FOR CHOLESTEROL 10/01/19 01/02/20  Doreene Nest, NP      Allergies    Codeine sulfate, Penicillins, Amoxapine and related, and Wellbutrin [bupropion]    Review of Systems   Review of Systems  Cardiovascular:  Positive for near-syncope.  All other systems reviewed and are negative.   Physical Exam Updated Vital Signs BP (!) 128/56 (BP Location: Right Arm)   Pulse 64   Temp 98.7 F (37.1 C) (Oral)   Resp 18   Ht 5\' 2"  (1.575 m)   Wt 70.3 kg   SpO2 99%   BMI 28.35 kg/m  Physical Exam Vitals and nursing note reviewed.  Constitutional:      Appearance: She is well-developed.  HENT:     Head: Normocephalic and atraumatic.  Cardiovascular:     Rate and Rhythm: Normal rate and regular rhythm.     Heart sounds: No murmur heard. Pulmonary:     Effort: Pulmonary effort is normal. No respiratory distress.     Breath sounds: Normal breath sounds.  Abdominal:     Palpations: Abdomen is soft.     Tenderness: There is no abdominal tenderness. There is no guarding or rebound.  Musculoskeletal:        General: No tenderness.  Skin:    General: Skin is warm and dry.  Neurological:     Mental Status: She is alert and oriented to person, place, and time.     Comments: Left facial droop. Oriented to person, place and recent events.  Disoriented to time.  5 out of 5 strength in all 4 extremities.  Visual fields grossly intact.  Psychiatric:        Behavior: Behavior normal.     ED Results / Procedures / Treatments   Labs (all labs ordered are listed, but only  abnormal results are displayed) Labs Reviewed - No data to display  EKG None  Radiology No results found.  Procedures Procedures  {Document cardiac monitor, telemetry assessment procedure when appropriate:1}  Medications Ordered in ED Medications - No data to display  ED Course/ Medical Decision Making/ A&P   {   Click here for ABCD2, HEART and other calculatorsREFRESH Note before signing :1}  Medical Decision Making Amount and/or Complexity of Data Reviewed Labs: ordered. Radiology: ordered.   ***  {Document critical care time when appropriate:1} {Document review of labs and clinical decision tools ie heart score, Chads2Vasc2 etc:1}  {Document your independent review of radiology images, and any outside records:1} {Document your discussion with family members, caretakers, and with consultants:1} {Document social determinants of health affecting pt's care:1} {Document your decision making why or why not admission, treatments were needed:1} Final Clinical Impression(s) / ED Diagnoses Final diagnoses:  None    Rx / DC Orders ED Discharge Orders     None

## 2023-05-28 NOTE — ED Triage Notes (Signed)
Pt went to the bathroom this evening and had a hard bowel movement. Almost passed out on the toilet. Cbg 238, bp was low palpated 64, has rebounded since 92/64. Now she is alert and oriented x 4.

## 2023-05-29 ENCOUNTER — Emergency Department (HOSPITAL_COMMUNITY): Payer: Medicare Other

## 2023-05-29 ENCOUNTER — Other Ambulatory Visit: Payer: Self-pay | Admitting: Neurology

## 2023-05-29 DIAGNOSIS — G20A1 Parkinson's disease without dyskinesia, without mention of fluctuations: Secondary | ICD-10-CM

## 2023-05-29 DIAGNOSIS — K449 Diaphragmatic hernia without obstruction or gangrene: Secondary | ICD-10-CM | POA: Diagnosis not present

## 2023-05-29 DIAGNOSIS — F418 Other specified anxiety disorders: Secondary | ICD-10-CM

## 2023-05-29 DIAGNOSIS — N289 Disorder of kidney and ureter, unspecified: Secondary | ICD-10-CM | POA: Diagnosis not present

## 2023-05-29 DIAGNOSIS — R55 Syncope and collapse: Secondary | ICD-10-CM | POA: Diagnosis not present

## 2023-05-29 DIAGNOSIS — R109 Unspecified abdominal pain: Secondary | ICD-10-CM | POA: Diagnosis not present

## 2023-05-29 LAB — CBC WITH DIFFERENTIAL/PLATELET
Abs Immature Granulocytes: 0.19 10*3/uL — ABNORMAL HIGH (ref 0.00–0.07)
Basophils Absolute: 0.1 10*3/uL (ref 0.0–0.1)
Basophils Relative: 1 %
Eosinophils Absolute: 0.1 10*3/uL (ref 0.0–0.5)
Eosinophils Relative: 1 %
HCT: 37.7 % (ref 36.0–46.0)
Hemoglobin: 12.2 g/dL (ref 12.0–15.0)
Immature Granulocytes: 2 %
Lymphocytes Relative: 7 %
Lymphs Abs: 0.7 10*3/uL (ref 0.7–4.0)
MCH: 31.1 pg (ref 26.0–34.0)
MCHC: 32.4 g/dL (ref 30.0–36.0)
MCV: 96.2 fL (ref 80.0–100.0)
Monocytes Absolute: 0.6 10*3/uL (ref 0.1–1.0)
Monocytes Relative: 6 %
Neutro Abs: 8.6 10*3/uL — ABNORMAL HIGH (ref 1.7–7.7)
Neutrophils Relative %: 83 %
Platelets: 160 10*3/uL (ref 150–400)
RBC: 3.92 MIL/uL (ref 3.87–5.11)
RDW: 13.5 % (ref 11.5–15.5)
WBC: 10.3 10*3/uL (ref 4.0–10.5)
nRBC: 0 % (ref 0.0–0.2)

## 2023-05-29 LAB — COMPREHENSIVE METABOLIC PANEL
ALT: 6 U/L (ref 0–44)
AST: 11 U/L — ABNORMAL LOW (ref 15–41)
Albumin: 3.7 g/dL (ref 3.5–5.0)
Alkaline Phosphatase: 70 U/L (ref 38–126)
Anion gap: 9 (ref 5–15)
BUN: 18 mg/dL (ref 8–23)
CO2: 29 mmol/L (ref 22–32)
Calcium: 9.2 mg/dL (ref 8.9–10.3)
Chloride: 101 mmol/L (ref 98–111)
Creatinine, Ser: 0.8 mg/dL (ref 0.44–1.00)
GFR, Estimated: >60 mL/min (ref >60)
Glucose, Bld: 127 mg/dL — ABNORMAL HIGH (ref 70–99)
Potassium: 3.5 mmol/L (ref 3.5–5.1)
Sodium: 139 mmol/L (ref 135–145)
Total Bilirubin: 0.6 mg/dL (ref 0.3–1.2)
Total Protein: 6.8 g/dL (ref 6.5–8.1)

## 2023-05-29 LAB — URINALYSIS, W/ REFLEX TO CULTURE (INFECTION SUSPECTED)
Bilirubin Urine: NEGATIVE
Glucose, UA: NEGATIVE mg/dL
Hgb urine dipstick: NEGATIVE
Ketones, ur: 5 mg/dL — AB
Leukocytes,Ua: NEGATIVE
Nitrite: NEGATIVE
Protein, ur: NEGATIVE mg/dL
Specific Gravity, Urine: 1.016 (ref 1.005–1.030)
pH: 6 (ref 5.0–8.0)

## 2023-05-29 LAB — MAGNESIUM: Magnesium: 1.9 mg/dL (ref 1.7–2.4)

## 2023-05-29 NOTE — Discharge Instructions (Signed)
You had a CT scan performed in the emergency department today.  There was a spot on your kidney.  Please follow-up with your family doctor to see if you want to have this reevaluated in the next year with repeat CT scan.  Get rechecked sooner if you have new or concerning symptoms.

## 2023-05-29 NOTE — ED Notes (Signed)
Pt successfully ambulated down the hallway and back with one person assist

## 2023-05-31 ENCOUNTER — Ambulatory Visit (INDEPENDENT_AMBULATORY_CARE_PROVIDER_SITE_OTHER): Payer: Medicare Other | Admitting: Primary Care

## 2023-05-31 ENCOUNTER — Encounter: Payer: Self-pay | Admitting: Primary Care

## 2023-05-31 VITALS — BP 116/72 | HR 76 | Temp 96.0°F | Wt 148.4 lb

## 2023-05-31 DIAGNOSIS — R55 Syncope and collapse: Secondary | ICD-10-CM

## 2023-05-31 DIAGNOSIS — I10 Essential (primary) hypertension: Secondary | ICD-10-CM | POA: Diagnosis not present

## 2023-05-31 DIAGNOSIS — Z23 Encounter for immunization: Secondary | ICD-10-CM | POA: Diagnosis not present

## 2023-05-31 NOTE — Progress Notes (Signed)
Subjective:    Patient ID: [name], female    DOB: Jan 09, 1946, 77 y.o.   MRN: [id number]  HPI  [name] is a very pleasant 77 y.o. female with a history of hypertension, GERD, type 2 diabetes, Parkinson's disease, recurrent UTI, osteoporosis, anxiety depression who presents today for emergency department follow-up.  She is also due for her Prevnar 20 vaccine.  She presented to [facility] emergency department via EMS on 05/28/2023 for syncope.  Prior to her syncopal event she felt like she needed to have a large bowel movement, experienced fecal incontinence with a large firm movement.  Soon after she became dizzy and unresponsive.  Her daughter assisted her to the bed.  Upon paramedics arrival her blood pressure was 60 systolic but did not improve with intervention so she was transported to [facility] long hospital for treatment.  During her stay in the ED she underwent lab work which showed normal renal function, normal hemogram without anemia.  Urinalysis was not consistent with cystitis.  She underwent CT abdomen/pelvis which was negative for acute process.  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  Her symptoms were suspected to be secondary to vasovagal syncope from her large bowel movement.  She was discharged home early the following day with recommendations for PCP follow-up.  Since her ED visit she's continued to experience symptoms. Her daughter reports that she's been having near syncopal and syncopal episodes multiple times weekly for the last 1 month. Symptoms will start with slurred speech, inability to move, and then near syncope or syncope.   Her most recent episode was yesterday, she was putting food into the microwave, and fell hitting the kitchen floor. Her granddaughter witnessed the fall and denies injury from the fall. The fire department came out  yesterday, checked her BP,  she doesn't recall the reading. She is managed on losartan-hydrochlorothiazide 50-12.5 mg daily for hypertension.   She has a prior history of seizure disorder which was thought to be secondary to Wellbutrin. No anti-seizure treatment in years.  Her appetite has decreased, she is not eating much.  Wt Readings from Last 3 Encounters:  05/31/23 148 lb 6.4 oz (67.3 kg)  05/28/23 155 lb (70.3 kg)  04/27/23 155 lb (70.3 kg)     BP Readings from Last 3 Encounters:  05/31/23 116/72  05/29/23 133/67  04/27/23 138/78      Review of Systems  Constitutional:  Negative for fever.  Respiratory:  Negative for shortness of breath.   Cardiovascular:  Negative for chest pain.  Gastrointestinal:  Negative for nausea and vomiting.  Neurological:  Positive for syncope. Negative for headaches.  Psychiatric/Behavioral:  The patient is not nervous/anxious.          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    "can't do either"   DVT (deep venous thrombosis), left "early 2000's"   RLE   Dysmetabolic syndrome X    Essential hypertension, benign    Headache(784.0) 05/2011   "pretty often since they put me on Parkinson's medicine"   History of shingles    "as a  teen and in my 63's"   Mixed hyperlipidemia    Morbid obesity (HCC)    Neurodegenerative gait disorder    Personal history of thrombophlebitis    Syncope and collapse 12/06/2011   "this am; I pass out fairly often"   Type II or unspecified type diabetes mellitus without mention of complication, not stated as uncontrolled 12/06/2011   "not anymore; had lap band OR"   Urinary urgency 01/15/2022    Social History   Socioeconomic History   Marital status: Widowed    Spouse  name: Not on file   Number of children: 2   Years of education: Not on file   Highest education level: Some college, no degree  Occupational History   Occupation: Restaurant manager, fast food: [employer]    Comment: retired, 04/2012  Tobacco Use   Smoking status: Never   Smokeless tobacco: Never  Vaping Use   Vaping status: Never Used  Substance and Sexual Activity   Alcohol use: No    Comment: none   Drug use: No   Sexual activity: Not Currently  Other Topics Concern   Not on file  Social History Narrative   Right handed   One story home   Lives with daughter and granddaughter.   Daughter is HPOA.   DNR.   Social Determinants of Health   Financial Resource Strain: Low Risk  (08/20/2021)   Overall Financial Resource Strain (CARDIA)    Difficulty of Paying Living Expenses: Not hard at all  Food Insecurity: No Food Insecurity (08/20/2021)   Hunger Vital Sign    Worried About Running Out of Food in the Last Year: Never true    Ran Out of Food in the Last Year: Never true  Transportation Needs: No Transportation Needs (08/20/2021)   PRAPARE - Administrator, Civil Service (Medical): No    Lack of Transportation (Non-Medical): No  Physical Activity: Insufficiently Active (08/20/2021)   Exercise Vital Sign    Days of Exercise per Week: 3 days    Minutes of Exercise per Session: 20 min  Stress: No Stress Concern Present (08/20/2021)   [employer] of Occupational Health - Occupational Stress Questionnaire    Feeling of Stress : Not at all  Social Connections: Socially Isolated (08/20/2021)   Social Connection and Isolation Panel [NHANES]    Frequency of Communication with Friends and Family: Never    Frequency of Social Gatherings with Friends and Family: More than three times a week    Attends Religious Services: Never    Database administrator or Organizations: No    Attends Banker Meetings: Never    Marital Status: Widowed  Intimate Partner  Violence: Not At Risk (08/20/2021)   Humiliation, Afraid, Rape, and Kick questionnaire    Fear of Current or Ex-Partner: No    Emotionally Abused: No    Physically Abused: No    Sexually Abused: No    Past Surgical History:  Procedure Laterality Date   bladder tack  11/1995   BREAST BIOPSY  05/20/2000   bilaterally   BREAST CYST ASPIRATION  12/1998   bilaterally   CARDIAC CATHETERIZATION  2004   CATARACT EXTRACTION W/ INTRAOCULAR LENS  IMPLANT, BILATERAL     FRACTURE SURGERY  02/09/2006   bilateral elbows   FRACTURE SURGERY     right knee   FRACTURE SURGERY     tib plateau   KNEE ARTHROSCOPY  12/12/2002   right  LAPAROSCOPIC GASTRIC BANDING  2008   SEPTOPLASTY  08/1998   with antral window    TONSILLECTOMY AND ADENOIDECTOMY  1970   TUBAL LIGATION  1970's    Family History  Problem Relation Age of Onset   Asthma Mother    COPD Father    Heart disease Father    Diabetes Sister    Vision loss Sister        legally blind   Hypertension Child    Hyperlipidemia Child    Diabetes Child    Diabetes Son    Breast cancer Neg Hx     Allergies  Allergen Reactions   Codeine Sulfate     REACTION: hallucinations   Penicillins Anaphylaxis, Swelling and Rash   Amoxapine And Related    Wellbutrin [Bupropion] Other (See Comments)    Seizures and hallucinations    Current Outpatient Medications on File Prior to Visit  Medication Sig Dispense Refill   ACCU-CHEK GUIDE test strip USE UP TO 4 TIMES DAILY AS DIRECTED 300 strip 1   Accu-Chek Softclix Lancets lancets USE UP TO 4 TIMES DAILY AS DIRECTED 200 each 1   aspirin 81 MG tablet Take 81 mg by mouth daily.     Bioflavonoid Products (GRAPE SEED PO) Take 1 tablet by mouth daily.      blood glucose meter kit and supplies KIT Dispense based on patient and insurance preference. Use up to four times daily as directed. (FOR ICD-9 250.00, 250.01). 1 each 0   calcium carbonate (TUMS EX) 750 MG chewable tablet Chew 1 tablet by mouth  as needed.      Calcium Carbonate-Vitamin D (CALCIUM + D PO) Take 600 mg by mouth daily.     carbidopa-levodopa (SINEMET IR) 25-100 MG tablet TAKE 3 TABLETS BY MOUTH 3 TIMES DAILY. 810 tablet 0   cetirizine (ZYRTEC) 10 MG tablet Take 10 mg by mouth daily.     Cholecalciferol (VITAMIN D3) 1000 UNITS CAPS Take 1 capsule by mouth daily.     clonazePAM (KLONOPIN) 0.5 MG tablet TAKE 1/2 TABLET BY MOUTH AT BEDTIME AND 1/2 DAILY AS NEEDED 90 tablet 1   escitalopram (LEXAPRO) 10 MG tablet TAKE 1 TABLET BY MOUTH EVERY DAY 90 tablet 0   feeding supplement (ENSURE COMPLETE) LIQD Take 237 mLs by mouth 2 (two) times daily between meals. 1 Bottle 11   fluticasone (FLONASE) 50 MCG/ACT nasal spray Place 2 sprays into both nostrils daily.     gabapentin (NEURONTIN) 100 MG capsule TAKE 1 CAPSULE BY MOUTH THREE TIMES A DAY 270 capsule 0   memantine (NAMENDA TITRATION PACK) tablet pack 5 mg/day x 1 week; 5 mg twice bid x 1 week; 5 mg in AM, 10 mg in PM x 1 week; then 10 mg twice daily 1 tablet 0   memantine (NAMENDA) 10 MG tablet Take 1 tablet (10 mg total) by mouth 2 (two) times daily. 180 tablet 1   memantine (NAMENDA) 10 MG tablet Take 1 pill twice a day 180 tablet 0   memantine (NAMENDA) 10 MG tablet Take 1 tablet (10 mg total) by mouth 2 (two) times daily. 180 tablet 1   mirtazapine (REMERON) 30 MG tablet Take 1 tablet (30 mg total) by mouth at bedtime. For sleep. 90 tablet 0   montelukast (SINGULAIR) 10 MG tablet TAKE 1 TABLET (10 MG TOTAL) BY MOUTH AT BEDTIME. FOR ALLERGIES 90 tablet 3   Multiple Vitamins-Minerals (CENTRUM SILVER ADULT 50+) TABS Take by mouth.     naproxen  sodium (ALEVE) 220 MG tablet Take 220 mg by mouth in the morning and at bedtime.     rosuvastatin (CRESTOR) 5 MG tablet TAKE 1 TABLET (5 MG TOTAL) BY MOUTH EVERY EVENING. FOR CHOLESTEROL. 90 tablet 3   vitamin B-12 (CYANOCOBALAMIN) 1000 MCG tablet Take 1,000 mcg by mouth daily.     [DISCONTINUED] simvastatin (ZOCOR) 20 MG tablet TAKE 1  TABLET BY MOUTH EVERY DAY AT BEDTIME FOR CHOLESTEROL 90 tablet 0   No current facility-administered medications on file prior to visit.    BP 116/72   Pulse 76   Temp (!) 96 F (35.6 C) (Temporal)   Wt 148 lb 6.4 oz (67.3 kg)   SpO2 94%   BMI 27.14 kg/m  Objective:   Physical Exam Eyes:     Extraocular Movements: Extraocular movements intact.  Cardiovascular:     Rate and Rhythm: Normal rate and regular rhythm.  Pulmonary:     Effort: Pulmonary effort is normal.     Breath sounds: Normal breath sounds.  Musculoskeletal:     Cervical back: Neck supple.  Skin:    General: Skin is warm and dry.  Neurological:     Mental Status: She is alert and oriented to person, place, and time.     Sensory: No sensory deficit.     Comments: Left sided facial droop more pronounced   Psychiatric:        Mood and Affect: Mood normal.           Assessment & Plan:  Syncope, unspecified syncope type Assessment & Plan: Recurrent with multiple falls.  Stat CT head without contrast ordered and pending.  Stop losartan-hydrochlorothiazide 50-12.5 mg daily.  Increase intake of water.  Follow-up with neurology as scheduled.  Reviewed emergency department labs, imaging, notes.  Orders: -     CT HEAD WO CONTRAST ( ); Future  HYPERTENSION, BENIGN ESSENTIAL Assessment & Plan: Given weight loss, coupled with recurrent syncope, discontinue losartan-hydrochlorothiazide.  Her daughter will monitor blood pressure readings at home.    Need for vaccination -     Pneumococcal conjugate vaccine 20-valent        Doreene Nest, NP

## 2023-05-31 NOTE — Assessment & Plan Note (Signed)
Given weight loss, coupled with recurrent syncope, discontinue losartan-hydrochlorothiazide.  Her daughter will monitor blood pressure readings at home.

## 2023-05-31 NOTE — Patient Instructions (Signed)
Stop taking losartan-hydrochlorothiazide medication for blood pressure.  Continue to work on hydration with water.  You will be contacted soon regarding the CT scan of your head.  Follow-up with your neurologist as scheduled.  It was a pleasure to see you today!

## 2023-05-31 NOTE — Assessment & Plan Note (Signed)
Recurrent with multiple falls.  Stat CT head without contrast ordered and pending.  Stop losartan-hydrochlorothiazide 50-12.5 mg daily.  Increase intake of water.  Follow-up with neurology as scheduled.  Reviewed emergency department labs, imaging, notes.

## 2023-06-02 ENCOUNTER — Ambulatory Visit: Payer: Medicare Other | Admitting: Neurology

## 2023-06-03 ENCOUNTER — Ambulatory Visit
Admission: RE | Admit: 2023-06-03 | Discharge: 2023-06-03 | Disposition: A | Discharge status: Home / Self Care | Payer: Medicare Other | Source: Ambulatory Visit | Attending: Primary Care

## 2023-06-03 DIAGNOSIS — R296 Repeated falls: Secondary | ICD-10-CM | POA: Diagnosis not present

## 2023-06-03 DIAGNOSIS — R55 Syncope and collapse: Secondary | ICD-10-CM | POA: Diagnosis not present

## 2023-06-03 DIAGNOSIS — R29810 Facial weakness: Secondary | ICD-10-CM | POA: Diagnosis not present

## 2023-06-10 ENCOUNTER — Ambulatory Visit (INDEPENDENT_AMBULATORY_CARE_PROVIDER_SITE_OTHER): Payer: Medicare Other

## 2023-06-10 VITALS — Ht 62.0 in | Wt 148.0 lb

## 2023-06-10 DIAGNOSIS — Z0000 Encounter for general adult medical examination without abnormal findings: Secondary | ICD-10-CM | POA: Diagnosis not present

## 2023-06-10 NOTE — Progress Notes (Signed)
Subjective:   [name] is a 77 y.o. female who presents for Medicare Annual (Subsequent) preventive examination.  Visit Complete: Virtual I connected with  [name] on 06/10/23 by a audio enabled telemedicine application and verified that I am speaking with the correct person using two identifiers.  Patient Location: Home  Provider Location: Office/Clinic  I discussed the limitations of evaluation and management by telemedicine. The patient expressed understanding and agreed to proceed.  Vital Signs: Because this visit was a virtual/telehealth visit, some criteria may be missing or patient reported. Any vitals not documented were not able to be obtained and vitals that have been documented are patient reported.  Patient Medicare AWV questionnaire was completed by the patient on (not done); I have confirmed that all information answered by patient is correct and no changes since this date. Cardiac Risk Factors include: advanced age (>21men, >46 women);diabetes mellitus;dyslipidemia;hypertension;sedentary lifestyle    Objective:    Today's Vitals   06/10/23 1304  Weight: 148 lb (67.1 kg)  Height: 5\' 2"  (1.575 m)   Body mass index is 27.07 kg/m.     06/10/2023    1:14 PM 05/28/2023   11:00 PM 04/27/2023    6:23 PM 03/17/2023    3:17 PM 11/30/2022    8:15 AM 05/26/2022    8:12 AM 03/21/2022    1:05 PM  Advanced Directives  Does Patient Have a Medical Advance Directive? Yes No No No Yes Yes Yes  Type of Estate agent of [place];Living will    Living will  Healthcare Power of [place];Living will  Copy of Healthcare Power of Attorney in Chart? No - copy requested          Current Medications (verified) Outpatient Encounter Medications as of 06/10/2023  Medication Sig   ACCU-CHEK GUIDE test strip USE UP TO 4 TIMES DAILY AS DIRECTED   Accu-Chek Softclix Lancets lancets USE UP TO 4 TIMES DAILY AS DIRECTED   aspirin 81 MG tablet Take 81 mg by  mouth daily.   Bioflavonoid Products (GRAPE SEED PO) Take 1 tablet by mouth daily.    blood glucose meter kit and supplies KIT Dispense based on patient and insurance preference. Use up to four times daily as directed. (FOR ICD-9 250.00, 250.01).   calcium carbonate (TUMS EX) 750 MG chewable tablet Chew 1 tablet by mouth as needed.    Calcium Carbonate-Vitamin D (CALCIUM + D PO) Take 600 mg by mouth daily.   carbidopa-levodopa (SINEMET IR) 25-100 MG tablet TAKE 3 TABLETS BY MOUTH 3 TIMES DAILY.   cetirizine (ZYRTEC) 10 MG tablet Take 10 mg by mouth daily.   Cholecalciferol (VITAMIN D3) 1000 UNITS CAPS Take 1 capsule by mouth daily.   clonazePAM (KLONOPIN) 0.5 MG tablet TAKE 1/2 TABLET BY MOUTH AT BEDTIME AND 1/2 DAILY AS NEEDED   escitalopram (LEXAPRO) 10 MG tablet TAKE 1 TABLET BY MOUTH EVERY DAY   feeding supplement (ENSURE COMPLETE) LIQD Take 237 mLs by mouth 2 (two) times daily between meals.   fluticasone (FLONASE) 50 MCG/ACT nasal spray Place 2 sprays into both nostrils daily.   gabapentin (NEURONTIN) 100 MG capsule TAKE 1 CAPSULE BY MOUTH THREE TIMES A DAY   memantine (NAMENDA TITRATION PACK) tablet pack 5 mg/day x 1 week; 5 mg twice bid x 1 week; 5 mg in AM, 10 mg in PM x 1 week; then 10 mg twice daily   memantine (NAMENDA) 10 MG tablet Take 1 tablet (10 mg total) by mouth  2 (two) times daily.   memantine (NAMENDA) 10 MG tablet Take 1 pill twice a day   memantine (NAMENDA) 10 MG tablet Take 1 tablet (10 mg total) by mouth 2 (two) times daily.   mirtazapine (REMERON) 30 MG tablet Take 1 tablet (30 mg total) by mouth at bedtime. For sleep.   montelukast (SINGULAIR) 10 MG tablet TAKE 1 TABLET (10 MG TOTAL) BY MOUTH AT BEDTIME. FOR ALLERGIES   Multiple Vitamins-Minerals (CENTRUM SILVER ADULT 50+) TABS Take by mouth.   naproxen sodium (ALEVE) 220 MG tablet Take 220 mg by mouth in the morning and at bedtime.   rosuvastatin (CRESTOR) 5 MG tablet TAKE 1 TABLET (5 MG TOTAL) BY MOUTH EVERY  EVENING. FOR CHOLESTEROL.   vitamin B-12 (CYANOCOBALAMIN) 1000 MCG tablet Take 1,000 mcg by mouth daily.   [DISCONTINUED] simvastatin (ZOCOR) 20 MG tablet TAKE 1 TABLET BY MOUTH EVERY DAY AT BEDTIME FOR CHOLESTEROL   No facility-administered encounter medications on file as of 06/10/2023.    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    "can't do either"   DVT (deep venous thrombosis), left "early 2000's"   RLE   Dysmetabolic syndrome X    Essential hypertension, benign    Headache(784.0) 05/2011   "pretty often since they put me on Parkinson's medicine"   History of shingles    "as a teen and in my 30's"   Mixed hyperlipidemia    Morbid obesity (HCC)    Neurodegenerative gait disorder    Personal history of thrombophlebitis    Syncope and collapse 12/06/2011   "this am; I pass out fairly often"   Type II or unspecified type diabetes mellitus without mention of complication, not stated as uncontrolled 12/06/2011   "not anymore; had lap band OR"   Urinary urgency 01/15/2022   Past Surgical History:  Procedure Laterality Date   bladder tack  11/1995   BREAST BIOPSY  05/20/2000   bilaterally   BREAST CYST ASPIRATION  12/1998   bilaterally   CARDIAC CATHETERIZATION  2004   CATARACT EXTRACTION W/ INTRAOCULAR LENS  IMPLANT, BILATERAL     FRACTURE SURGERY  02/09/2006   bilateral elbows   FRACTURE SURGERY     right knee   FRACTURE SURGERY     tib plateau   KNEE ARTHROSCOPY  12/12/2002   right    LAPAROSCOPIC GASTRIC BANDING  2008   SEPTOPLASTY  08/1998   with antral window    TONSILLECTOMY AND ADENOIDECTOMY  1970   TUBAL LIGATION   1970's   Family History  Problem Relation Age of Onset   Asthma Mother    COPD Father    Heart disease Father    Diabetes Sister    Vision loss Sister        legally blind   Hypertension Child    Hyperlipidemia Child    Diabetes Child    Diabetes Son    Breast cancer Neg Hx    Social History   Socioeconomic History   Marital status: Widowed    Spouse name: Not on file   Number of children: 2   Years of education: Not on  file   Highest education level: Some college, no degree  Occupational History   Occupation: Restaurant manager, fast food: [employer]    Comment: retired, 04/2012  Tobacco Use   Smoking status: Never   Smokeless tobacco: Never  Vaping Use   Vaping status: Never Used  Substance and Sexual Activity   Alcohol use: No    Comment: none   Drug use: No   Sexual activity: Not Currently  Other Topics Concern   Not on file  Social History Narrative   Right handed   One story home   Lives with daughter and granddaughter.   Daughter is HPOA.   DNR.   Social Determinants of Health   Financial Resource Strain: Low Risk  (06/10/2023)   Overall Financial Resource Strain (CARDIA)    Difficulty of Paying Living Expenses: Not hard at all  Food Insecurity: No Food Insecurity (06/10/2023)   Hunger Vital Sign    Worried About Running Out of Food in the Last Year: Never true    Ran Out of Food in the Last Year: Never true  Transportation Needs: No Transportation Needs (06/10/2023)   PRAPARE - Administrator, Civil Service (Medical): No    Lack of Transportation (Non-Medical): No  Physical Activity: Inactive (06/10/2023)   Exercise Vital Sign    Days of Exercise per Week: 0 days    Minutes of Exercise per Session: 0 min  Stress: No Stress Concern Present (06/10/2023)   [employer] of Occupational Health - Occupational Stress Questionnaire    Feeling of Stress : Not at all  Social Connections: Socially Isolated (06/10/2023)   Social  Connection and Isolation Panel [NHANES]    Frequency of Communication with Friends and Family: Never    Frequency of Social Gatherings with Friends and Family: More than three times a week    Attends Religious Services: Never    Database administrator or Organizations: No    Attends Banker Meetings: Never    Marital Status: Widowed    Tobacco Counseling Counseling given: Not Answered   Clinical Intake:  Pre-visit preparation completed: No  Pain : No/denies pain     BMI - recorded: 27.07 Nutritional Status: BMI 25 -29 Overweight Nutritional Risks: None Diabetes: No  How often do you need to have someone help you when you read instructions, pamphlets, or other written materials from your doctor or pharmacy?: 1 - Never  Interpreter Needed?: No  Comments: daughter lives with pt Information entered by :: B.Margorie Renner,LPN   Activities of Daily Living    06/10/2023    1:14 PM  In your present state of health, do you have any difficulty performing the following activities:  Hearing? 1  Vision? 0  Difficulty concentrating or making decisions? 1  Walking or climbing stairs? 1  Dressing or bathing? 0  Doing errands, shopping? 1  Preparing Food and eating ? N  Using the Toilet? N  In the past six months, have you accidently leaked urine? Y  Do you have problems with loss of bowel control? N  Managing your Medications? N  Managing your Finances? N  Housekeeping or managing your Housekeeping? N    Patient Care Team: Doreene Nest, NP as PCP - General (Internal Medicine) Tat, Octaviano Batty, DO as Consulting Physician (Neurology) Pa, [facility] (Optometry) Eli Phillips, OD (Optometry)  Indicate any recent Medical Services you may have received from other than [facility] providers in the past year (  date may be approximate).     Assessment:   This is a routine wellness examination for [place].  Hearing/Vision screen Hearing Screening - Comments:: Pt  waiting on hearing aids Vision Screening - Comments:: Pt says vision is good with glasses Appt June 24 Dr Rolinda Roan   Goals Addressed             This Visit's Progress    COMPLETED: Patient Stated   On track    Starting 12/14/2018, I will continue to take medication as prescribed.      COMPLETED: Patient Stated   On track    12/12/2019, I will maintain and continue medications as prescribed.      Patient Stated   Not on track    Would like to increase fluid intake       Depression Screen    06/10/2023    1:11 PM 05/31/2023    3:41 PM 04/26/2023    9:22 AM 02/08/2023    3:23 PM 12/15/2021    8:09 AM 08/20/2021   11:31 AM 06/12/2021   10:24 AM  PHQ 2/9 Scores  PHQ - 2 Score 0 4 3 4  0 0 0  PHQ- 9 Score  19 12 12  0      Fall Risk    06/10/2023    1:07 PM 04/26/2023    9:22 AM 02/08/2023    3:25 PM 02/08/2023    3:23 PM 11/30/2022    8:16 AM  Fall Risk   Falls in the past year? 0 0 1 1 0  Number falls in past yr: 0 0 1 1 0  Injury with Fall? 0 0 0 0 0  Risk for fall due to : No Fall Risks No Fall Risks History of fall(s) History of fall(s);Other (Comment)   Follow up Education provided;Falls prevention discussed Falls evaluation completed Falls evaluation completed Falls evaluation completed Falls evaluation completed    MEDICARE RISK AT HOME: Medicare Risk at Home Any stairs in or around the home?: Yes If so, are there any without handrails?: Yes Home free of loose throw rugs in walkways, pet beds, electrical cords, etc?: Yes Adequate lighting in your home to reduce risk of falls?: Yes Life alert?: No Use of a cane, walker or w/c?: Yes Grab bars in the bathroom?: Yes Shower chair or bench in shower?: Yes Elevated toilet seat or a handicapped toilet?: Yes  TIMED UP AND GO:  Was the test performed?  No    Cognitive Function:    02/18/2020    9:00 AM 12/12/2019    3:37 PM 12/14/2018    1:48 PM 12/07/2017    8:36 AM 09/09/2017    8:28 AM  MMSE - Mini Mental State  Exam  Not completed:  Refused   Refused  Orientation to time 4  5 4    Orientation to Place 5  5 5    Registration 3  3 3    Attention/ Calculation 5  0 5   Recall 0  2 2   Recall-comments   unable to recall 1 of 3 words     Language- name 2 objects 2  0 2   Language- repeat 1  1 1    Language- follow 3 step command 3  0 3   Language- read & follow direction 1  0 1   Write a sentence 1  0 1   Copy design 0  0 1   Total score 25  16 28  05/07/2014    8:39 AM  Montreal Cognitive Assessment   Visuospatial/ Executive (0/5) 2  Naming (0/3) 2  Attention: Read list of digits (0/2) 1  Attention: Read list of letters (0/1) 1  Attention: Serial 7 subtraction starting at 100 (0/3) 3  Language: Repeat phrase (0/2) 2  Language : Fluency (0/1) 1  Abstraction (0/2) 2  Delayed Recall (0/5) 5  Orientation (0/6) 6  Total 25  Adjusted Score (based on education) 26      06/10/2023    1:17 PM  6CIT Screen  What Year? 0 points  What month? 0 points  What time? 0 points  Count back from 20 0 points  Months in reverse 2 points  Repeat phrase 10 points  Total Score 12 points    Immunizations Immunization History  Administered Date(s) Administered   Fluad Quad(high Dose 65+) 03/30/2019, 06/04/2020, 04/15/2022   Fluad Trivalent(High Dose 65+) 04/26/2023   H1N1 08/29/2008   Influenza Split 04/07/2012   Influenza Whole 04/16/2010   Influenza, High Dose Seasonal PF 05/12/2017, 04/11/2021   Influenza,inj,Quad PF,6+ Mos 04/03/2013, 06/13/2014, 05/27/2015, 04/22/2016, 04/03/2018   PFIZER(Purple Top)SARS-COV-2 Vaccination 10/17/2019, 11/07/2019, 06/13/2020   PNEUMOCOCCAL CONJUGATE-20 05/31/2023   Pneumococcal Conjugate-13 11/26/2013   Pneumococcal Polysaccharide-23 04/16/2010   Td 08/29/2008   Tdap 06/21/2012   Zoster Recombinant(Shingrix) 03/15/2020, 05/15/2020   Zoster, Live 04/18/2008    TDAP status: Up to date  Flu Vaccine status: Up to date  Pneumococcal vaccine status: Up  to date  Covid-19 vaccine status: Completed vaccines  Qualifies for Shingles Vaccine? Yes   Zostavax completed Yes   Shingrix Completed?: Yes  Screening Tests Health Maintenance  Topic Date Due   MAMMOGRAM  05/18/2023   COVID-19 Vaccine (4 - 2023-24 season) 06/26/2023 (Originally 03/27/2023)   FOOT EXAM  08/26/2023 (Originally 06/12/2022)   HEMOGLOBIN A1C  10/25/2023   OPHTHALMOLOGY EXAM  01/04/2024   Diabetic kidney evaluation - Urine ACR  04/25/2024   Diabetic kidney evaluation - eGFR measurement  05/28/2024   Medicare Annual Wellness (AWV)  06/09/2024   Pneumonia Vaccine 69+ Years old  Completed   INFLUENZA VACCINE  Completed   DEXA SCAN  Completed   Hepatitis C Screening  Completed   Zoster Vaccines- Shingrix  Completed   HPV VACCINES  Aged Out   DTaP/Tdap/Td  Discontinued   Fecal DNA (Cologuard)  Discontinued    Health Maintenance  Health Maintenance Due  Topic Date Due   MAMMOGRAM  05/18/2023    Colorectal cancer screening: No longer required.   Mammogram status: No longer required due to age.  Bone Density status: Completed 04/26/2023. Results reflect: Bone density results: OSTEOPOROSIS. Repeat every 3 years.  Lung Cancer Screening: (Low Dose CT Chest recommended if Age 30-80 years, 20 pack-year currently smoking OR have quit w/in 15years.) does not qualify.   Lung Cancer Screening Referral: no  Additional Screening:  Hepatitis C Screening: does not qualify; Completed 12/01/2015  Vision Screening: Recommended annual ophthalmology exams for early detection of glaucoma and other disorders of the eye. Is the patient up to date with their annual eye exam?  Yes  Who is the provider or what is the name of the office in which the patient attends annual eye exams? Dr Rolinda Roan If pt is not established with a provider, would they like to be referred to a provider to establish care? No .   Dental Screening: Recommended annual dental exams for proper oral  hygiene  Diabetic Foot Exam: Diabetic Foot Exam: Completed  06/12/2022  Community Resource Referral / Chronic Care Management: CRR required this visit?  No   CCM required this visit?  No    Plan:     I have personally reviewed and noted the following in the patient's chart:   Medical and social history Use of alcohol, tobacco or illicit drugs  Current medications and supplements including opioid prescriptions. Patient is not currently taking opioid prescriptions. Functional ability and status Nutritional status Physical activity Advanced directives List of other physicians Hospitalizations, surgeries, and ER visits in previous 12 months Vitals Screenings to include cognitive, depression, and falls Referrals and appointments  In addition, I have reviewed and discussed with patient certain preventive protocols, quality metrics, and best practice recommendations. A written personalized care plan for preventive services as well as general preventive health recommendations were provided to patient.     Sue Lush, LPN   04/54/0981   After Visit Summary: (MyChart) Due to this being a telephonic visit, the after visit summary with patients personalized plan was offered to patient via MyChart   Nurse Notes: The spoke with patient and her daughter states she is doing well and has no concerns or questions at this time.

## 2023-06-10 NOTE — Patient Instructions (Signed)
Ms. Kattner , Thank you for taking time to come for your Medicare Wellness Visit. I appreciate your ongoing commitment to your health goals. Please review the following plan we discussed and let me know if I can assist you in the future.   Referrals/Orders/Follow-Ups/Clinician Recommendations: none  This is a list of the screening recommended for you and due dates:  Health Maintenance  Topic Date Due   Complete foot exam   06/12/2022   Eye exam for diabetics  12/15/2022   COVID-19 Vaccine (4 - 2023-24 season) 03/27/2023   Mammogram  05/18/2023   Hemoglobin A1C  10/25/2023   Yearly kidney health urinalysis for diabetes  04/25/2024   Yearly kidney function blood test for diabetes  05/28/2024   Medicare Annual Wellness Visit  06/09/2024   Pneumonia Vaccine  Completed   Flu Shot  Completed   DEXA scan (bone density measurement)  Completed   Hepatitis C Screening  Completed   Zoster (Shingles) Vaccine  Completed   HPV Vaccine  Aged Out   DTaP/Tdap/Td vaccine  Discontinued   Cologuard (Stool DNA test)  Discontinued    Advanced directives: (Copy Requested) Please bring a copy of your health care power of attorney and living will to the office to be added to your chart at your convenience.  Next Medicare Annual Wellness Visit scheduled for next year: Yes 06/12/24 @ 1pm telephone

## 2023-06-14 NOTE — Progress Notes (Deleted)
Assessment/Plan:   1.  Parkinsons Disease             -Continue carbidopa/levodopa 25/100, 3 tablets 3 times per day  -discussed need for PT again but declined again.  They just ordered a walker for her    2.  Depression             -On Lexapro             -On mirtazapine             -Managed by PCP   3.  RBD and GAD             -On clonazepam 0.5 mg, half tablet at bedtime and will take half tablet as needed in the day for anxiety.             -PDMP reviewed.  No red flags.   4.  Chronic headache             -On gabapentin, 100 mg 3 times per day.             -Saw Dr. Zachery Conch in the remote past.   5.  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             -Patient last had neurocognitive testing in August, 2021 with Dr. Roseanne Reno.   -she thought that low dose donepezil 5 mg caused eds  -Continue memantine, 10 mg twice per day   6.  History of vasovagal syncope (associated with bowel movement) in November, 2024  -Patient had several episodes around that time.  Her blood pressure medication was stopped after that.  -Discussed concept of permissive hypertension in Parkinsons.  -Doubt daytime clonazepam contributes, but if symptoms continue I am going to stop the daytime clonazepam   Subjective:   [name] was seen today in follow up for Parkinsons disease.  My previous records were reviewed prior to todays visit as well as outside records available to me.  She was started on memantine last visit and work to 10 mg twice per day.  She is tolerating medication well, without side effects.  She was in the emergency room November 2 after a near syncopal episode.  She was going to the bathroom to have a bowel movement but did not quite make it to the bathroom and stated that she had a large, firm bowel movement.  She became dizzy and called for assistance.  Her daughter assisted her to the bed, but then she became unresponsive.  EMS was contacted.  Her systolic blood pressure was 60  systolic upon arrival that quickly improved without intervention.  Blood pressure in the emergency room was 133/67.  Emergency room workup was negative.  Daughter reports that she has had several similar episodes over the last month.  She followed up with her primary nurse practitioner November 5 and reported similar.  She had an episode where she was putting food in the microwave and had a syncopal episode.  Emergency personnel came out from the fire department and checked her blood pressure, but they do not recall the reading.  Patient was on blood pressure medication (losartan hydrochlorothiazide).  Blood pressure in nurse practitioner office was 116/72.  Her blood pressure medication was stopped.  CT brain was completed November 8 and was negative.  Current prescribed movement disorder medications:  Carbidopa/levodopa 25/100, 3 tablets 3 times per day Mirtazapine, 30 mg at night Gabapentin, 100 mg 3 times per day  Clonazepam, 0.25 mg twice daily (PDMP reviewed and last filled 09/13/2022). Memantine, 10 mg twice daily (started last visit)  Prior meds: aricept, 5 mg (tired and pt d/c)    ALLERGIES:   Allergies  Allergen Reactions   Codeine Sulfate     REACTION: hallucinations   Penicillins Anaphylaxis, Swelling and Rash   Amoxapine And Related    Wellbutrin [Bupropion] Other (See Comments)    Seizures and hallucinations    CURRENT MEDICATIONS:  Outpatient Encounter Medications as of 06/16/2023  Medication Sig   ACCU-CHEK GUIDE test strip USE UP TO 4 TIMES DAILY AS DIRECTED   Accu-Chek Softclix Lancets lancets USE UP TO 4 TIMES DAILY AS DIRECTED   aspirin 81 MG tablet Take 81 mg by mouth daily.   Bioflavonoid Products (GRAPE SEED PO) Take 1 tablet by mouth daily.    blood glucose meter kit and supplies KIT Dispense based on patient and insurance preference. Use up to four times daily as directed. (FOR ICD-9 250.00, 250.01).   calcium carbonate (TUMS EX) 750 MG chewable tablet Chew 1  tablet by mouth as needed.    Calcium Carbonate-Vitamin D (CALCIUM + D PO) Take 600 mg by mouth daily.   carbidopa-levodopa (SINEMET IR) 25-100 MG tablet TAKE 3 TABLETS BY MOUTH 3 TIMES DAILY.   cetirizine (ZYRTEC) 10 MG tablet Take 10 mg by mouth daily.   Cholecalciferol (VITAMIN D3) 1000 UNITS CAPS Take 1 capsule by mouth daily.   clonazePAM (KLONOPIN) 0.5 MG tablet TAKE 1/2 TABLET BY MOUTH AT BEDTIME AND 1/2 DAILY AS NEEDED   escitalopram (LEXAPRO) 10 MG tablet TAKE 1 TABLET BY MOUTH EVERY DAY   feeding supplement (ENSURE COMPLETE) LIQD Take 237 mLs by mouth 2 (two) times daily between meals.   fluticasone (FLONASE) 50 MCG/ACT nasal spray Place 2 sprays into both nostrils daily.   gabapentin (NEURONTIN) 100 MG capsule TAKE 1 CAPSULE BY MOUTH THREE TIMES A DAY   memantine (NAMENDA TITRATION PACK) tablet pack 5 mg/day x 1 week; 5 mg twice bid x 1 week; 5 mg in AM, 10 mg in PM x 1 week; then 10 mg twice daily   memantine (NAMENDA) 10 MG tablet Take 1 tablet (10 mg total) by mouth 2 (two) times daily.   memantine (NAMENDA) 10 MG tablet Take 1 pill twice a day   memantine (NAMENDA) 10 MG tablet Take 1 tablet (10 mg total) by mouth 2 (two) times daily.   mirtazapine (REMERON) 30 MG tablet Take 1 tablet (30 mg total) by mouth at bedtime. For sleep.   montelukast (SINGULAIR) 10 MG tablet TAKE 1 TABLET (10 MG TOTAL) BY MOUTH AT BEDTIME. FOR ALLERGIES   Multiple Vitamins-Minerals (CENTRUM SILVER ADULT 50+) TABS Take by mouth.   naproxen sodium (ALEVE) 220 MG tablet Take 220 mg by mouth in the morning and at bedtime.   rosuvastatin (CRESTOR) 5 MG tablet TAKE 1 TABLET (5 MG TOTAL) BY MOUTH EVERY EVENING. FOR CHOLESTEROL.   vitamin B-12 (CYANOCOBALAMIN) 1000 MCG tablet Take 1,000 mcg by mouth daily.   [DISCONTINUED] simvastatin (ZOCOR) 20 MG tablet TAKE 1 TABLET BY MOUTH EVERY DAY AT BEDTIME FOR CHOLESTEROL   No facility-administered encounter medications on file as of 06/16/2023.    Objective:    PHYSICAL EXAMINATION:    VITALS:   There were no vitals filed for this visit.     GEN:  The patient appears stated age and is in NAD. HEENT:  Normocephalic, atraumatic.  The mucous membranes are moist. The superficial  temporal arteries are without ropiness or tenderness. CV:  RRR Lungs:  CTAB but there is upper airway noises   Neurological examination:  Orientation: The patient is alert and oriented x3. Cranial nerves: There is good facial symmetry with minimal facial hypomimia. The speech is fluent and clear. Soft palate rises symmetrically and there is no tongue deviation. Hearing is decreased  to conversational tone. Sensation: Sensation is intact to light touch throughout Motor: Strength is at least antigravity x4.  Movement examination: Tone: There is nl tone in the UE/LE Abnormal movements: none Coordination:  There is no decremation with any form of RAMS, including alternating supination and pronation of the forearm, hand opening and closing, finger taps, heel taps and toe taps.  Gait and Station: Patient pushes off of the chair to arise. The patient's stride length is slow and tenuous and a bit unstable (similar to last visit) I have reviewed and interpreted the following labs independently    Chemistry      Component Value Date/Time   NA 139 05/29/2023 0340   K 3.5 05/29/2023 0340   CL 101 05/29/2023 0340   CO2 29 05/29/2023 0340   BUN 18 05/29/2023 0340   CREATININE 0.80 05/29/2023 0340   CREATININE 0.69 09/03/2022 1614      Component Value Date/Time   CALCIUM 9.2 05/29/2023 0340   ALKPHOS 70 05/29/2023 0340   AST 11 (L) 05/29/2023 0340   ALT 6 05/29/2023 0340   BILITOT 0.6 05/29/2023 0340       Lab Results  Component Value Date   WBC 10.3 05/28/2023   HGB 12.2 05/28/2023   HCT 37.7 05/28/2023   MCV 96.2 05/28/2023   PLT 160 05/28/2023    Lab Results  Component Value Date   TSH 1.04 12/07/2017     Total time spent on today's visit was ***  minutes, including both face-to-face time and nonface-to-face time.  Time included that spent on review of records (prior notes available to me/labs/imaging if pertinent), discussing treatment and goals, answering patient's questions and coordinating care.  Cc:  Doreene Nest, NP

## 2023-06-16 ENCOUNTER — Ambulatory Visit: Payer: Medicare Other | Admitting: Neurology

## 2023-06-19 ENCOUNTER — Other Ambulatory Visit: Payer: Self-pay | Admitting: Neurology

## 2023-06-19 ENCOUNTER — Other Ambulatory Visit: Payer: Self-pay | Admitting: Primary Care

## 2023-06-19 DIAGNOSIS — F341 Dysthymic disorder: Secondary | ICD-10-CM

## 2023-06-19 DIAGNOSIS — R413 Other amnesia: Secondary | ICD-10-CM

## 2023-06-19 DIAGNOSIS — G20A1 Parkinson's disease without dyskinesia, without mention of fluctuations: Secondary | ICD-10-CM

## 2023-06-19 DIAGNOSIS — F418 Other specified anxiety disorders: Secondary | ICD-10-CM

## 2023-06-29 ENCOUNTER — Emergency Department (HOSPITAL_COMMUNITY): Payer: Medicare Other

## 2023-06-29 ENCOUNTER — Encounter (HOSPITAL_COMMUNITY): Payer: Self-pay

## 2023-06-29 ENCOUNTER — Inpatient Hospital Stay (HOSPITAL_COMMUNITY)
Admission: EM | Admit: 2023-06-29 | Discharge: 2023-07-02 | DRG: 689 | Disposition: A | Discharge status: Home / Self Care | Payer: Medicare Other | Attending: Internal Medicine | Admitting: Internal Medicine

## 2023-06-29 ENCOUNTER — Ambulatory Visit: Payer: Medicare Other | Admitting: Family Medicine

## 2023-06-29 DIAGNOSIS — Z8249 Family history of ischemic heart disease and other diseases of the circulatory system: Secondary | ICD-10-CM

## 2023-06-29 DIAGNOSIS — Z96651 Presence of right artificial knee joint: Secondary | ICD-10-CM | POA: Diagnosis present

## 2023-06-29 DIAGNOSIS — R2681 Unsteadiness on feet: Secondary | ICD-10-CM | POA: Diagnosis present

## 2023-06-29 DIAGNOSIS — I10 Essential (primary) hypertension: Secondary | ICD-10-CM | POA: Diagnosis present

## 2023-06-29 DIAGNOSIS — N390 Urinary tract infection, site not specified: Secondary | ICD-10-CM | POA: Diagnosis present

## 2023-06-29 DIAGNOSIS — Z66 Do not resuscitate: Secondary | ICD-10-CM | POA: Diagnosis present

## 2023-06-29 DIAGNOSIS — Z1152 Encounter for screening for COVID-19: Secondary | ICD-10-CM | POA: Diagnosis not present

## 2023-06-29 DIAGNOSIS — E876 Hypokalemia: Secondary | ICD-10-CM | POA: Diagnosis present

## 2023-06-29 DIAGNOSIS — N3000 Acute cystitis without hematuria: Principal | ICD-10-CM | POA: Diagnosis present

## 2023-06-29 DIAGNOSIS — R531 Weakness: Secondary | ICD-10-CM | POA: Diagnosis present

## 2023-06-29 DIAGNOSIS — F0280 Dementia in other diseases classified elsewhere without behavioral disturbance: Secondary | ICD-10-CM | POA: Diagnosis present

## 2023-06-29 DIAGNOSIS — Z79899 Other long term (current) drug therapy: Secondary | ICD-10-CM | POA: Diagnosis not present

## 2023-06-29 DIAGNOSIS — Z961 Presence of intraocular lens: Secondary | ICD-10-CM | POA: Diagnosis present

## 2023-06-29 DIAGNOSIS — R410 Disorientation, unspecified: Secondary | ICD-10-CM | POA: Diagnosis not present

## 2023-06-29 DIAGNOSIS — B9620 Unspecified Escherichia coli [E. coli] as the cause of diseases classified elsewhere: Secondary | ICD-10-CM | POA: Diagnosis present

## 2023-06-29 DIAGNOSIS — Z7982 Long term (current) use of aspirin: Secondary | ICD-10-CM | POA: Diagnosis not present

## 2023-06-29 DIAGNOSIS — Z794 Long term (current) use of insulin: Secondary | ICD-10-CM | POA: Diagnosis not present

## 2023-06-29 DIAGNOSIS — Z888 Allergy status to other drugs, medicaments and biological substances status: Secondary | ICD-10-CM | POA: Diagnosis not present

## 2023-06-29 DIAGNOSIS — Z885 Allergy status to narcotic agent status: Secondary | ICD-10-CM | POA: Diagnosis not present

## 2023-06-29 DIAGNOSIS — F0390 Unspecified dementia without behavioral disturbance: Secondary | ICD-10-CM | POA: Diagnosis not present

## 2023-06-29 DIAGNOSIS — E1165 Type 2 diabetes mellitus with hyperglycemia: Secondary | ICD-10-CM | POA: Diagnosis present

## 2023-06-29 DIAGNOSIS — Z833 Family history of diabetes mellitus: Secondary | ICD-10-CM | POA: Diagnosis not present

## 2023-06-29 DIAGNOSIS — G20A1 Parkinson's disease without dyskinesia, without mention of fluctuations: Secondary | ICD-10-CM | POA: Diagnosis present

## 2023-06-29 DIAGNOSIS — Z9842 Cataract extraction status, left eye: Secondary | ICD-10-CM | POA: Diagnosis not present

## 2023-06-29 DIAGNOSIS — Z9841 Cataract extraction status, right eye: Secondary | ICD-10-CM | POA: Diagnosis not present

## 2023-06-29 DIAGNOSIS — R0989 Other specified symptoms and signs involving the circulatory and respiratory systems: Secondary | ICD-10-CM | POA: Diagnosis not present

## 2023-06-29 DIAGNOSIS — G510 Bell's palsy: Secondary | ICD-10-CM | POA: Diagnosis present

## 2023-06-29 DIAGNOSIS — R29810 Facial weakness: Secondary | ICD-10-CM | POA: Diagnosis not present

## 2023-06-29 DIAGNOSIS — F341 Dysthymic disorder: Secondary | ICD-10-CM | POA: Diagnosis present

## 2023-06-29 DIAGNOSIS — Z880 Allergy status to penicillin: Secondary | ICD-10-CM | POA: Diagnosis not present

## 2023-06-29 DIAGNOSIS — F32A Depression, unspecified: Secondary | ICD-10-CM | POA: Diagnosis present

## 2023-06-29 DIAGNOSIS — G9341 Metabolic encephalopathy: Secondary | ICD-10-CM | POA: Diagnosis present

## 2023-06-29 DIAGNOSIS — R739 Hyperglycemia, unspecified: Secondary | ICD-10-CM | POA: Diagnosis not present

## 2023-06-29 DIAGNOSIS — Z8669 Personal history of other diseases of the nervous system and sense organs: Secondary | ICD-10-CM

## 2023-06-29 DIAGNOSIS — G3184 Mild cognitive impairment, so stated: Secondary | ICD-10-CM | POA: Insufficient documentation

## 2023-06-29 LAB — URINALYSIS, ROUTINE W REFLEX MICROSCOPIC
Bilirubin Urine: NEGATIVE
Glucose, UA: NEGATIVE mg/dL
Ketones, ur: 5 mg/dL — AB
Nitrite: NEGATIVE
Protein, ur: NEGATIVE mg/dL
Specific Gravity, Urine: 1.019 (ref 1.005–1.030)
WBC, UA: >50 WBC/hpf (ref 0–5)
pH: 6 (ref 5.0–8.0)

## 2023-06-29 LAB — HEPATIC FUNCTION PANEL
ALT: 6 U/L (ref 0–44)
AST: 10 U/L — ABNORMAL LOW (ref 15–41)
Albumin: 3.5 g/dL (ref 3.5–5.0)
Alkaline Phosphatase: 70 U/L (ref 38–126)
Bilirubin, Direct: <0.1 mg/dL (ref 0.0–0.2)
Total Bilirubin: 0.5 mg/dL (ref <1.2)
Total Protein: 6.4 g/dL — ABNORMAL LOW (ref 6.5–8.1)

## 2023-06-29 LAB — BASIC METABOLIC PANEL
Anion gap: 9 (ref 5–15)
BUN: 20 mg/dL (ref 8–23)
CO2: 30 mmol/L (ref 22–32)
Calcium: 9.3 mg/dL (ref 8.9–10.3)
Chloride: 104 mmol/L (ref 98–111)
Creatinine, Ser: 0.81 mg/dL (ref 0.44–1.00)
GFR, Estimated: >60 mL/min (ref >60)
Glucose, Bld: 213 mg/dL — ABNORMAL HIGH (ref 70–99)
Potassium: 3.4 mmol/L — ABNORMAL LOW (ref 3.5–5.1)
Sodium: 143 mmol/L (ref 135–145)

## 2023-06-29 LAB — RESP PANEL BY RT-PCR (RSV, FLU A&B, COVID)  RVPGX2
Influenza A by PCR: NEGATIVE
Influenza B by PCR: NEGATIVE
Resp Syncytial Virus by PCR: NEGATIVE
SARS Coronavirus 2 by RT PCR: NEGATIVE

## 2023-06-29 LAB — CBG MONITORING, ED: Glucose-Capillary: 184 mg/dL — ABNORMAL HIGH (ref 70–99)

## 2023-06-29 LAB — CBC
HCT: 38.4 % (ref 36.0–46.0)
Hemoglobin: 12.2 g/dL (ref 12.0–15.0)
MCH: 29.7 pg (ref 26.0–34.0)
MCHC: 31.8 g/dL (ref 30.0–36.0)
MCV: 93.4 fL (ref 80.0–100.0)
Platelets: 169 10*3/uL (ref 150–400)
RBC: 4.11 MIL/uL (ref 3.87–5.11)
RDW: 13.9 % (ref 11.5–15.5)
WBC: 8.4 10*3/uL (ref 4.0–10.5)
nRBC: 0 % (ref 0.0–0.2)

## 2023-06-29 LAB — MAGNESIUM: Magnesium: 2.1 mg/dL (ref 1.7–2.4)

## 2023-06-29 LAB — TROPONIN I (HIGH SENSITIVITY): Troponin I (High Sensitivity): 9 ng/L (ref <18)

## 2023-06-29 LAB — GLUCOSE, CAPILLARY: Glucose-Capillary: 141 mg/dL — ABNORMAL HIGH (ref 70–99)

## 2023-06-29 MED ORDER — POTASSIUM CHLORIDE CRYS ER 20 MEQ PO TBCR
60.0000 meq | EXTENDED_RELEASE_TABLET | Freq: Once | ORAL | Status: AC
  Administered 2023-06-29: 60 meq via ORAL
  Filled 2023-06-29: qty 3

## 2023-06-29 MED ORDER — ROSUVASTATIN CALCIUM 5 MG PO TABS
5.0000 mg | ORAL_TABLET | Freq: Every evening | ORAL | Status: DC
  Administered 2023-06-29 – 2023-07-01 (×3): 5 mg via ORAL
  Filled 2023-06-29 (×3): qty 1

## 2023-06-29 MED ORDER — ESCITALOPRAM OXALATE 10 MG PO TABS
10.0000 mg | ORAL_TABLET | Freq: Every day | ORAL | Status: DC
  Administered 2023-06-30 – 2023-07-02 (×3): 10 mg via ORAL
  Filled 2023-06-29 (×3): qty 1

## 2023-06-29 MED ORDER — LORATADINE 10 MG PO TABS
10.0000 mg | ORAL_TABLET | Freq: Every day | ORAL | Status: DC
  Administered 2023-06-29 – 2023-07-01 (×3): 10 mg via ORAL
  Filled 2023-06-29 (×3): qty 1

## 2023-06-29 MED ORDER — VITAMIN D 25 MCG (1000 UNIT) PO TABS
1000.0000 [IU] | ORAL_TABLET | Freq: Every day | ORAL | Status: DC
Start: 2023-06-30 — End: 2023-07-02
  Administered 2023-06-30 – 2023-07-02 (×3): 1000 [IU] via ORAL
  Filled 2023-06-29 (×3): qty 1

## 2023-06-29 MED ORDER — MIRTAZAPINE 15 MG PO TABS
30.0000 mg | ORAL_TABLET | Freq: Every day | ORAL | Status: DC
  Administered 2023-06-29 – 2023-07-01 (×3): 30 mg via ORAL
  Filled 2023-06-29 (×3): qty 2

## 2023-06-29 MED ORDER — ENSURE ENLIVE PO LIQD
237.0000 mL | Freq: Two times a day (BID) | ORAL | Status: DC
  Administered 2023-06-30 – 2023-07-01 (×3): 237 mL via ORAL

## 2023-06-29 MED ORDER — DONEPEZIL HCL 10 MG PO TABS
5.0000 mg | ORAL_TABLET | Freq: Every day | ORAL | Status: DC
  Administered 2023-06-29 – 2023-07-01 (×3): 5 mg via ORAL
  Filled 2023-06-29 (×3): qty 1

## 2023-06-29 MED ORDER — INSULIN ASPART 100 UNIT/ML IJ SOLN
0.0000 [IU] | Freq: Every day | INTRAMUSCULAR | Status: DC
  Filled 2023-06-29: qty 0.05

## 2023-06-29 MED ORDER — ENOXAPARIN SODIUM 40 MG/0.4ML IJ SOSY
40.0000 mg | PREFILLED_SYRINGE | INTRAMUSCULAR | Status: DC
  Administered 2023-06-29 – 2023-07-01 (×3): 40 mg via SUBCUTANEOUS
  Filled 2023-06-29 (×3): qty 0.4

## 2023-06-29 MED ORDER — ASPIRIN 81 MG PO TBEC
81.0000 mg | DELAYED_RELEASE_TABLET | Freq: Every day | ORAL | Status: DC
  Administered 2023-06-30 – 2023-07-02 (×3): 81 mg via ORAL
  Filled 2023-06-29 (×3): qty 1

## 2023-06-29 MED ORDER — FLUTICASONE PROPIONATE 50 MCG/ACT NA SUSP: 2.0000 | Freq: Every day | NASAL | Status: DC | PRN

## 2023-06-29 MED ORDER — MONTELUKAST SODIUM 10 MG PO TABS
10.0000 mg | ORAL_TABLET | Freq: Every day | ORAL | Status: DC
  Administered 2023-06-29 – 2023-07-01 (×3): 10 mg via ORAL
  Filled 2023-06-29 (×3): qty 1

## 2023-06-29 MED ORDER — GABAPENTIN 100 MG PO CAPS
100.0000 mg | ORAL_CAPSULE | Freq: Three times a day (TID) | ORAL | Status: DC
  Administered 2023-06-29 – 2023-07-02 (×8): 100 mg via ORAL
  Filled 2023-06-29 (×8): qty 1

## 2023-06-29 MED ORDER — CARBIDOPA-LEVODOPA 25-100 MG PO TABS
3.0000 | ORAL_TABLET | Freq: Three times a day (TID) | ORAL | Status: DC
  Administered 2023-06-29 – 2023-07-02 (×8): 3 via ORAL
  Filled 2023-06-29 (×7): qty 3

## 2023-06-29 MED ORDER — MEMANTINE HCL 10 MG PO TABS
10.0000 mg | ORAL_TABLET | Freq: Two times a day (BID) | ORAL | Status: DC
  Administered 2023-06-29 – 2023-07-02 (×6): 10 mg via ORAL
  Filled 2023-06-29 (×6): qty 1

## 2023-06-29 MED ORDER — INSULIN ASPART 100 UNIT/ML IJ SOLN
0.0000 [IU] | Freq: Three times a day (TID) | INTRAMUSCULAR | Status: DC
  Administered 2023-06-30: 2 [IU] via SUBCUTANEOUS
  Administered 2023-06-30 (×2): 1 [IU] via SUBCUTANEOUS
  Administered 2023-07-01: 2 [IU] via SUBCUTANEOUS
  Administered 2023-07-01: 1 [IU] via SUBCUTANEOUS
  Administered 2023-07-01: 2 [IU] via SUBCUTANEOUS
  Administered 2023-07-02: 1 [IU] via SUBCUTANEOUS
  Filled 2023-06-29: qty 0.09

## 2023-06-29 NOTE — H&P (Signed)
History and Physical    Patient: [name] [name]:[id number] DOB: 11/30/1945 DOA: 06/29/2023 DOS: the patient was seen and examined on 06/29/2023 PCP: Doreene Nest, NP  Patient coming from: Home.  Lives with his daughter who is a primary caregiver.  Recently started using walker.  Chief Complaint:  Chief Complaint  Patient presents with   Weakness   HPI: [name] is a 77 y.o. female with PMH of Parkinson's, dementia, Bell's palsy, anxiety, depression, HTN and DM-2 presenting with progressive generalized weakness, unsteady gait, confusion and decreased oral intake.  History provided by patient's daughter at bedside.  Patient was able to ambulate independently before 1 months.  She has progressive weakness with unsteady gait, confusion and decreased oral intake over the last 1 months.  No acute change over the last days or week.  No recent changes to home medications.  No fever, chills, runny nose, sore throat, chest pain, cough, shortness of breath, nausea, vomiting, abdominal pain or UTI symptoms.  Daughter reports foul-smelling urine but no report of dysuria, frequency, urgency or incontinence.  Per daughter, does not smoke cigarettes, drink alcohol or recreational drug.  She has DNR/DNI.  In ED, slightly hypertensive.  Other vitals normal.  K3.4.  Glucose 213.  CBC without significant finding.  Troponin 9.0.  COVID-19, influenza and RSV PCR nonreactive.  UA with pyuria and bacteriuria with small ketonuria.  CT head without acute finding.  CT head and CXR without acute finding.  EKG features sinus rhythm with LAFB and PRWP.  Urine culture sent.  Patient was started on IV ceftriaxone.  Admission requested for UTI and generalized weakness.  Review of Systems: As mentioned in the history of present illness. All other systems reviewed and are negative. 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    "can't do either"   DVT (deep venous thrombosis), left "early 2000's"   RLE   Dysmetabolic syndrome X    Essential hypertension, benign    Headache(784.0) 05/2011   "pretty often since they put me on Parkinson's medicine"   History of shingles    "as a teen and in my 30's"   Mixed hyperlipidemia    Morbid obesity (HCC)    Neurodegenerative gait disorder    Personal history of thrombophlebitis    Syncope and collapse 12/06/2011   "this am; I pass out fairly often"   Type II or unspecified type diabetes mellitus without mention of complication, not stated as uncontrolled 12/06/2011   "not anymore; had lap band OR"   Urinary urgency 01/15/2022   Past Surgical History:  Procedure Laterality Date   bladder tack  11/1995   BREAST BIOPSY  05/20/2000   bilaterally   BREAST CYST ASPIRATION  12/1998   bilaterally   CARDIAC CATHETERIZATION  2004   CATARACT EXTRACTION W/ INTRAOCULAR LENS  IMPLANT, BILATERAL     FRACTURE SURGERY  02/09/2006   bilateral elbows   FRACTURE SURGERY     right knee   FRACTURE SURGERY     tib plateau   KNEE ARTHROSCOPY  12/12/2002   right    LAPAROSCOPIC GASTRIC BANDING  2008   SEPTOPLASTY  08/1998   with antral window    TONSILLECTOMY AND ADENOIDECTOMY  1970   TUBAL LIGATION  1970's   Social History:  reports that she has never smoked. She has never used smokeless tobacco. She reports that she does not drink alcohol and does not use drugs.  Allergies  Allergen Reactions   Codeine Sulfate     REACTION: hallucinations   Penicillins Anaphylaxis, Swelling and Rash   Amoxapine And Related    Wellbutrin [Bupropion] Other (See Comments)    Seizures and hallucinations    Family History  Problem Relation Age of Onset   Asthma Mother    COPD Father    Heart disease Father    Diabetes Sister    Vision  loss Sister        legally blind   Hypertension Child    Hyperlipidemia Child    Diabetes Child    Diabetes Son    Breast cancer Neg Hx     Prior to Admission medications   Medication Sig Start Date End Date Taking? Authorizing Provider  ACCU-CHEK GUIDE test strip USE UP TO 4 TIMES DAILY AS DIRECTED 11/14/22   Doreene Nest, NP  Accu-Chek Softclix Lancets lancets USE UP TO 4 TIMES DAILY AS DIRECTED 07/25/22   Doreene Nest, NP  aspirin 81 MG tablet Take 81 mg by mouth daily.    [provider]  Bioflavonoid Products (GRAPE SEED PO) Take 1 tablet by mouth daily.     [provider]  blood glucose meter kit and supplies KIT Dispense based on patient and insurance preference. Use up to four times daily as directed. (FOR ICD-9 250.00, 250.01). 04/15/22   Doreene Nest, NP  calcium carbonate (TUMS EX) 750 MG chewable tablet Chew 1 tablet by mouth as needed.     [provider]  Calcium Carbonate-Vitamin D (CALCIUM + D PO) Take 600 mg by mouth daily.    [provider]  carbidopa-levodopa (SINEMET IR) 25-100 MG tablet TAKE 3 TABLETS BY MOUTH 3 TIMES DAILY. Patient taking differently: Take 3 tablets by mouth 3 (three) times daily. 05/24/23   Tat, Octaviano Batty, DO  cetirizine (ZYRTEC) 10 MG tablet Take 10 mg by mouth daily.    [provider]  Cholecalciferol (VITAMIN D3) 1000 UNITS CAPS Take 1 capsule by mouth daily.    [provider]  clonazePAM (KLONOPIN) 0.5 MG tablet TAKE 1/2 TABLET BY MOUTH AT BEDTIME AND 1/2 DAILY AS NEEDED Patient taking differently: Take 0.25 mg by mouth See admin instructions. TAKE 1/2 TABLET BY MOUTH AT BEDTIME AND 1/2 DAILY AS NEEDED 06/20/23   Tat, Octaviano Batty, DO  donepezil (ARICEPT) 5 MG tablet TAKE 1 TABLET BY MOUTH EVERYDAY AT BEDTIME Patient taking differently: Take 5 mg by mouth at bedtime. 06/20/23   Tat, Octaviano Batty, DO  escitalopram (LEXAPRO) 10 MG tablet TAKE 1 TABLET BY MOUTH EVERY DAY 06/20/23    Tat, Octaviano Batty, DO  feeding supplement (ENSURE COMPLETE) LIQD Take 237 mLs by mouth 2 (two) times daily between meals. 04/01/12   Alison Murray, MD  fluticasone [facility]) 50 MCG/ACT nasal spray Place 2 sprays into both nostrils daily. 10/02/20   [provider]  gabapentin (NEURONTIN) 100 MG capsule TAKE 1 CAPSULE BY MOUTH THREE TIMES A DAY Patient taking differently: Take 100 mg by mouth 3 (three) times daily. 06/20/23   Tat, Octaviano Batty, DO  memantine (NAMENDA) 10 MG tablet Take 1 tablet (10 mg total) by mouth 2 (two) times daily. 01/07/23   Tat, Octaviano Batty, DO  mirtazapine (REMERON)  30 MG tablet TAKE 1 TABLET (30 MG TOTAL) BY MOUTH AT BEDTIME. FOR SLEEP. 06/20/23   Doreene Nest, NP  montelukast (SINGULAIR) 10 MG tablet TAKE 1 TABLET (10 MG TOTAL) BY MOUTH AT BEDTIME. FOR ALLERGIES 05/01/23   Doreene Nest, NP  Multiple Vitamins-Minerals (CENTRUM SILVER ADULT 50+) TABS Take by mouth.    [provider]  naproxen sodium (ALEVE) 220 MG tablet Take 220 mg by mouth in the morning and at bedtime.    [provider]  rosuvastatin (CRESTOR) 5 MG tablet TAKE 1 TABLET (5 MG TOTAL) BY MOUTH EVERY EVENING. FOR CHOLESTEROL. 05/01/23   Doreene Nest, NP  vitamin B-12 (CYANOCOBALAMIN) 1000 MCG tablet Take 1,000 mcg by mouth daily.    [provider]  simvastatin (ZOCOR) 20 MG tablet TAKE 1 TABLET BY MOUTH EVERY DAY AT BEDTIME FOR CHOLESTEROL 10/01/19 01/02/20  Doreene Nest, NP    Physical Exam: Vitals:   06/29/23 1324 06/29/23 1330  BP: (!) 169/71   Pulse: 63   Resp: 18   Temp: 98 F (36.7 C)   TempSrc: Oral   SpO2: 97% 96%   GENERAL: No apparent distress.  Nontoxic. HEENT: MMM.  Vision and hearing grossly intact.  NECK: Supple.  No apparent JVD.  RESP:  No IWOB.  Fair aeration bilaterally. CVS:  RRR. Heart sounds normal.  ABD/GI/GU: BS+. Abd soft, NTND.  MSK/EXT:   No apparent deformity. Moves extremities. No edema.  SKIN: no apparent skin lesion or  wound NEURO: Awake and alert.  Oriented to self, person and place but not time.  Follows commands.  No apparent focal neuro deficit other than facial droop from Bell's palsy. PSYCH: Calm. Normal affect.   Data Reviewed: See HPI  Assessment and Plan: Possible urinary tract infection: Based on urinalysis.  No report of bladder habit change, suprapubic tenderness, fever or leukocytosis.  Progressive decline likely from progressive dementia/Parkinson disease versus UTI. -Will continue ceftriaxone pending urine culture  Dementia/Parkinson disease: Daughter reports progressive weakness, confusion and poor p.o. intake.  Likely progressive disease.  CT head without acute finding. -Reorientation and delirium precaution -Continue home meds after med rec  NIDDM-2 with hyperglycemia: Last A1c 7.2% on 04/26/2023.  Does not seem to be on meds Recent Labs  Lab 06/29/23 1438  GLUCAP 184*  -Liberate diet given poor p.o. intake -SSI-sensitive  Anxiety and depression: Stable -Resume home meds after med rec.  Generalized weakness: Concern for failure to thrive -Check TSH -PT/OT  Hypokalemia: K3.4 -P.o. KCl 40 x 1 -Check mag    Advance Care Planning:   Code Status: Limited: Do not attempt resuscitation (DNR) -DNR-LIMITED -Do Not Intubate/DNI -discussed with patient's daughter at bedside  Consults: None   Family Communication: updated patient's daughter at bedside  Severity of Illness: The appropriate patient status for this patient is INPATIENT. Inpatient status is judged to be reasonable and necessary in order to provide the required intensity of service to ensure the patient's safety. The patient's presenting symptoms, physical exam findings, and initial radiographic and laboratory data in the context of their chronic comorbidities is felt to place them at high risk for further clinical deterioration. Furthermore, it is not anticipated that the patient will be medically stable for discharge  from the hospital within 2 midnights of admission.   * I certify that at the point of admission it is my clinical judgment that the patient will require inpatient hospital care spanning beyond 2 midnights from the point of admission due  to high intensity of service, high risk for further deterioration and high frequency of surveillance required.*  Author: Almon Hercules, MD 06/29/2023 5:14 PM  For on call review [email or web address].

## 2023-06-29 NOTE — ED Notes (Signed)
ED TO INPATIENT HANDOFF REPORT  Name/Age/Gender [name] 77 y.o. female  Code Status    Code Status Orders  (From admission, onward)           Start     Ordered   06/29/23 1628  Do not attempt resuscitation (DNR)- Limited -Do Not Intubate (DNI)  Continuous       Question Answer Comment  If pulseless and not breathing No CPR or chest compressions.   In Pre-Arrest Conditions (Patient Is Breathing and Has A Pulse) Do not intubate. Provide all appropriate non-invasive medical interventions. Avoid ICU transfer unless indicated or required.   Consent: Discussion documented in EHR or advanced directives reviewed      06/29/23 1629           Code Status History     Date Active Date Inactive Code Status Order ID Comments User Context   01/13/2021 0055 01/13/2021 1636 Full Code [id number]  Chotiner, Claudean Severance, MD ED   06/11/2016 0821 06/14/2016 1344 DNR [id number]  Marcos Eke, PA-C ED   04/04/2012 0240 04/07/2012 1823 DNR [id number]  Sharyl Nimrod D, RN Inpatient   03/27/2012 1548 04/01/2012 1855 DNR [id number]  Karma Greaser, RN Inpatient   12/06/2011 1202 12/07/2011 2106 Full Code [id number]  Lorane Gell, MD Inpatient      Advance Directive Documentation    Flowsheet Row Most Recent Value  Type of Advance Directive Healthcare Power of Attorney  Pre-existing out of facility DNR order (yellow form or pink MOST form) --  "MOST" Form in Place? --       Home/SNF/Other Home  Chief Complaint UTI (urinary tract infection) [N39.0]  Level of Care/Admitting Diagnosis ED Disposition     ED Disposition  Admit   Condition  --   Comment  Hospital Area: [facility] [[id number]]  Level of Care: Med-Surg [16]  May admit patient to [facility] or [facility] if equivalent level of care is available:: No  Covid Evaluation: Confirmed COVID Negative  Diagnosis: UTI (urinary tract infection) [283151]  Admitting Physician: Almon Hercules [[id number]]  Attending  Physician: Almon Hercules [id number]  Certification:: I certify this patient will need inpatient services for at least 2 midnights  Expected Medical Readiness: 07/01/2023          Medical History 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    "can't do either"   DVT (deep venous thrombosis), left "early 2000's"   RLE   Dysmetabolic syndrome X    Essential hypertension, benign    Headache(784.0) 05/2011   "pretty often since they put me on Parkinson's medicine"   History of shingles    "as a teen and in my 30's"   Mixed hyperlipidemia    Morbid obesity (HCC)    Neurodegenerative gait disorder    Personal history of thrombophlebitis    Syncope and collapse 12/06/2011   "this am; I pass out fairly often"   Type II or unspecified type diabetes mellitus without mention of complication, not stated as uncontrolled 12/06/2011   "not anymore; had lap band OR"   Urinary urgency 01/15/2022    Allergies Allergies  Allergen Reactions   Codeine Sulfate  REACTION: hallucinations   Penicillins Anaphylaxis, Swelling and Rash   Amoxapine And Related    Wellbutrin [Bupropion] Other (See Comments)    Seizures and hallucinations    IV Location/Drains/Wounds Patient Lines/Drains/Airways Status     Active Line/Drains/Airways     Name Placement date Placement time Site Days   Peripheral IV 05/28/23 20 G 1" Anterior;Left Forearm 05/28/23  2230  Forearm  32   Peripheral IV 06/29/23 22 G Anterior;Right Forearm 06/29/23  1433  Forearm  less than 1            Labs/Imaging Results for orders placed or performed during the hospital encounter of 06/29/23 (from the past 48 hour(s))  Urinalysis, Routine w reflex microscopic -Urine, Clean Catch     Status:  Abnormal   Collection Time: 06/29/23  2:07 PM  Result Value Ref Range   Color, Urine AMBER (A) YELLOW    Comment: BIOCHEMICALS MAY BE AFFECTED BY COLOR   APPearance CLOUDY (A) CLEAR   Specific Gravity, Urine 1.019 1.005 - 1.030   pH 6.0 5.0 - 8.0   Glucose, UA NEGATIVE NEGATIVE mg/dL   Hgb urine dipstick SMALL (A) NEGATIVE   Bilirubin Urine NEGATIVE NEGATIVE   Ketones, ur 5 (A) NEGATIVE mg/dL   Protein, ur NEGATIVE NEGATIVE mg/dL   Nitrite NEGATIVE NEGATIVE   Leukocytes,Ua LARGE (A) NEGATIVE   RBC / HPF 0-5 0 - 5 RBC/hpf   WBC, UA >50 0 - 5 WBC/hpf   Bacteria, UA MANY (A) NONE SEEN   Squamous Epithelial / HPF 0-5 0 - 5 /HPF   Mucus PRESENT    Amorphous Crystal PRESENT     Comment: Performed at [facility], [address] [place], [place] [address]  Resp panel by RT-PCR (RSV, Flu A&B, Covid) Anterior Nasal Swab     Status: None   Collection Time: 06/29/23  2:07 PM   Specimen: Anterior Nasal Swab  Result Value Ref Range   SARS Coronavirus 2 by RT PCR NEGATIVE NEGATIVE    Comment: (NOTE) SARS-CoV-2 target nucleic acids are NOT DETECTED.  The SARS-CoV-2 RNA is generally detectable in upper respiratory specimens during the acute phase of infection. The lowest concentration of SARS-CoV-2 viral copies this assay can detect is 138 copies/mL. A negative result does not preclude SARS-Cov-2 infection and should not be used as the sole basis for treatment or other patient management decisions. A negative result may occur with  improper specimen collection/handling, submission of specimen other than nasopharyngeal swab, presence of viral mutation(s) within the areas targeted by this assay, and inadequate number of viral copies(<138 copies/mL). A negative result must be combined with clinical observations, patient history, and epidemiological information. The expected result is Negative.  Fact Sheet for Patients:  BloggerCourse.com  Fact  Sheet for Healthcare Providers:  SeriousBroker.it  This test is no t yet approved or cleared by the [place] FDA and  has been authorized for detection and/or diagnosis of SARS-CoV-2 by FDA under an Emergency Use Authorization (EUA). This EUA will remain  in effect (meaning this test can be used) for the duration of the COVID-19 declaration under Section 564(b)(1) of the Act, 21 U.S.C.section 360bbb-3(b)(1), unless the authorization is terminated  or revoked sooner.       Influenza A by PCR NEGATIVE NEGATIVE   Influenza B by PCR NEGATIVE NEGATIVE    Comment: (NOTE) The Xpert Xpress SARS-CoV-2/FLU/RSV plus assay is intended as an aid in the diagnosis of influenza from Nasopharyngeal swab specimens and  should not be used as a sole basis for treatment. Nasal washings and aspirates are unacceptable for Xpert Xpress SARS-CoV-2/FLU/RSV testing.  Fact Sheet for Patients: BloggerCourse.com  Fact Sheet for Healthcare Providers: SeriousBroker.it  This test is not yet approved or cleared by the [place] FDA and has been authorized for detection and/or diagnosis of SARS-CoV-2 by FDA under an Emergency Use Authorization (EUA). This EUA will remain in effect (meaning this test can be used) for the duration of the COVID-19 declaration under Section 564(b)(1) of the Act, 21 U.S.C. section 360bbb-3(b)(1), unless the authorization is terminated or revoked.     Resp Syncytial Virus by PCR NEGATIVE NEGATIVE    Comment: (NOTE) Fact Sheet for Patients: BloggerCourse.com  Fact Sheet for Healthcare Providers: SeriousBroker.it  This test is not yet approved or cleared by the [place] FDA and has been authorized for detection and/or diagnosis of SARS-CoV-2 by FDA under an Emergency Use Authorization (EUA). This EUA will remain in effect (meaning this test can  be used) for the duration of the COVID-19 declaration under Section 564(b)(1) of the Act, 21 U.S.C. section 360bbb-3(b)(1), unless the authorization is terminated or revoked.  Performed at [facility], [address] [place], [place] [address]   CBG monitoring, ED     Status: Abnormal   Collection Time: 06/29/23  2:38 PM  Result Value Ref Range   Glucose-Capillary 184 (H) 70 - 99 mg/dL    Comment: Glucose reference range applies only to samples taken after fasting for at least 8 hours.  Basic metabolic panel     Status: Abnormal   Collection Time: 06/29/23  2:40 PM  Result Value Ref Range   Sodium 143 135 - 145 mmol/L   Potassium 3.4 (L) 3.5 - 5.1 mmol/L   Chloride 104 98 - 111 mmol/L   CO2 30 22 - 32 mmol/L   Glucose, Bld 213 (H) 70 - 99 mg/dL    Comment: Glucose reference range applies only to samples taken after fasting for at least 8 hours.   BUN 20 8 - 23 mg/dL   Creatinine, Ser 0.86 0.44 - 1.00 mg/dL   Calcium 9.3 8.9 - 57.8 mg/dL   GFR, Estimated >46 >96 mL/min    Comment: (NOTE) Calculated using the CKD-EPI Creatinine Equation (2021)    Anion gap 9 5 - 15    Comment: Performed at [facility], [address], [place], [place] [address]  CBC     Status: None   Collection Time: 06/29/23  2:40 PM  Result Value Ref Range   WBC 8.4 4.0 - 10.5 K/uL   RBC 4.11 3.87 - 5.11 MIL/uL   Hemoglobin 12.2 12.0 - 15.0 g/dL   HCT 41.3 24.4 - 01.0 %   MCV 93.4 80.0 - 100.0 fL   MCH 29.7 26.0 - 34.0 pg   MCHC 31.8 30.0 - 36.0 g/dL   RDW 27.2 53.6 - 64.4 %   Platelets 169 150 - 400 K/uL   nRBC 0.0 0.0 - 0.2 %    Comment: Performed at [facility], [address], [place], [place] [address]  Troponin I (High Sensitivity)     Status: None   Collection Time: 06/29/23  2:40 PM  Result Value Ref Range   Troponin I (High Sensitivity) 9 <18 ng/L    Comment: (NOTE) Elevated high sensitivity troponin I (hsTnI) values and  significant  changes across serial measurements may suggest ACS but many other  chronic and  acute conditions are known to elevate hsTnI results.  Refer to the "Links" section for chest pain algorithms and additional  guidance. Performed at [facility], [address] [place], [place] [address]   Hepatic function panel     Status: Abnormal   Collection Time: 06/29/23  2:40 PM  Result Value Ref Range   Total Protein 6.4 (L) 6.5 - 8.1 g/dL   Albumin 3.5 3.5 - 5.0 g/dL   AST 10 (L) 15 - 41 U/L   ALT 6 0 - 44 U/L   Alkaline Phosphatase 70 38 - 126 U/L   Total Bilirubin 0.5 <1.2 mg/dL   Bilirubin, Direct <9.5 0.0 - 0.2 mg/dL   Indirect Bilirubin NOT CALCULATED 0.3 - 0.9 mg/dL    Comment: Performed at [facility], [address], [place], [place] [address]  Magnesium     Status: None   Collection Time: 06/29/23  2:40 PM  Result Value Ref Range   Magnesium 2.1 1.7 - 2.4 mg/dL    Comment: Performed at [facility], [address] [place], [place] [address]   DG Chest 2 View  Result Date: 06/29/2023 CLINICAL DATA:  Two-week history of weakness and increasing confusion EXAM: CHEST - 2 VIEW COMPARISON:  Chest radiograph dated 05/28/2023 FINDINGS: Low lung volumes with bronchovascular crowding. No focal consolidations. No pleural effusion or pneumothorax. Similar mildly enlarged cardiomediastinal silhouette. No acute osseous abnormality. Partially imaged gastric band. IMPRESSION: 1. Low lung volumes with bronchovascular crowding. No focal consolidations. 2. Similar mild cardiomegaly. Electronically Signed   By: Agustin Cree M.D.   On: 06/29/2023 16:16   CT Head Wo Contrast  Result Date: 06/29/2023 CLINICAL DATA:  Altered mental status EXAM: CT HEAD WITHOUT CONTRAST TECHNIQUE: Contiguous axial images were obtained from the base of the skull through the vertex without intravenous contrast. RADIATION DOSE REDUCTION: This exam was performed  according to the departmental dose-optimization program which includes automated exposure control, adjustment of the mA and/or kV according to patient size and/or use of iterative reconstruction technique. COMPARISON:  06/03/2023 FINDINGS: Brain: No evidence of acute infarction, hemorrhage, mass, mass effect, or midline shift. No hydrocephalus or extra-axial fluid collection. Mildly advanced cerebral atrophy for age. Periventricular white matter changes, likely the sequela of chronic small vessel ischemic disease. Vascular: No hyperdense vessel. Skull: Negative for fracture or focal lesion. Sinuses/Orbits: Sequela of prior endoscopic sinus surgery. Mucosal thickening in the right maxillary sinus, with osseous thickening compatible with chronic right maxillary sinusitis. Mild mucosal thickening right sphenoid sinus and bilateral ethmoid air cells. Status post bilateral lens replacements. Other: The mastoid air cells are well aerated. IMPRESSION: No acute intracranial process. Electronically Signed   By: Wiliam Ke M.D.   On: 06/29/2023 14:57    Pending Labs Unresulted Labs (From admission, onward)     Start     Ordered   07/06/23 0500  Creatinine, serum  (enoxaparin (LOVENOX)    CrCl >/= 30 ml/min)  Weekly,   R     Comments: while on enoxaparin therapy    06/29/23 1629   06/30/23 0500  CBC  Tomorrow morning,   R        06/29/23 1629   06/30/23 0500  TSH  Tomorrow morning,   R        06/29/23 1724   06/30/23 0500  Renal function panel  Tomorrow morning,   R        06/29/23 1724   06/30/23 0500  Magnesium  Tomorrow morning,   R        06/29/23 1724   06/29/23 1515  Urine Culture  Add-on,   AD       Question:  Indication  Answer:  Bacteriuria screening (OB/GYN or Uro)   06/29/23 1514            Vitals/Pain Today's Vitals   06/29/23 1324 06/29/23 1330 06/29/23 1332 06/29/23 1738  BP: (!) 169/71     Pulse: 63     Resp: 18     Temp: 98 F (36.7 C)   97.8 F (36.6 C)  TempSrc: Oral    Oral  SpO2: 97% 96%    PainSc:   0-No pain     Isolation Precautions No active isolations  Medications Medications  enoxaparin (LOVENOX) injection 40 mg (has no administration in time range)  insulin aspart (novoLOG) injection 0-9 Units (has no administration in time range)  insulin aspart (novoLOG) injection 0-5 Units (has no administration in time range)  potassium chloride SA (KLOR-CON M) CR tablet 60 mEq (60 mEq Oral Given 06/29/23 1759)    Mobility walks

## 2023-06-29 NOTE — ED Provider Notes (Signed)
[place] EMERGENCY DEPARTMENT AT [facility] Provider Note   CSN: [id number] Arrival date & time: 06/29/23  1306     History {Add pertinent medical, surgical, social history, OB history to HPI:1} Chief Complaint  Patient presents with   Weakness    [name] is a 77 y.o. female.  77 year old female with a history of Parkinson's on Sinemet, dementia, generalized anxiety, depression, and left-sided Bell's palsy who presents to the emergency department with generalized weakness and confusion.  History obtained predominantly from the patient's daughter.  Says that she has been much weaker than usual over the past month.  Says that they are having a difficult time getting her to eat and that she will occasionally just sip on a [facility] for several days at the time.  Has been also getting confused and will stare off into space blankly.  Will occasionally say that she is seeing people that are not present in her room.  She can no longer manage her own medications.  Used to walk without assistance but is now requiring a walker and significant assistance to get around.  Has had a nonproductive cough.  Also has had some foul-smelling urine.  Lives at home with her daughter who feels that she would benefit from inpatient rehab.  Did have a head CT on 06/03/2023 that did not reveal any acute abnormalities.       Home Medications Prior to Admission medications   Medication Sig Start Date End Date Taking? Authorizing Provider  ACCU-CHEK GUIDE test strip USE UP TO 4 TIMES DAILY AS DIRECTED 11/14/22   Doreene Nest, NP  Accu-Chek Softclix Lancets lancets USE UP TO 4 TIMES DAILY AS DIRECTED 07/25/22   Doreene Nest, NP  aspirin 81 MG tablet Take 81 mg by mouth daily.    [provider]  Bioflavonoid Products (GRAPE SEED PO) Take 1 tablet by mouth daily.     [provider]  blood glucose meter kit and supplies KIT Dispense based on patient and insurance  preference. Use up to four times daily as directed. (FOR ICD-9 250.00, 250.01). 04/15/22   Doreene Nest, NP  calcium carbonate (TUMS EX) 750 MG chewable tablet Chew 1 tablet by mouth as needed.     [provider]  Calcium Carbonate-Vitamin D (CALCIUM + D PO) Take 600 mg by mouth daily.    [provider]  carbidopa-levodopa (SINEMET IR) 25-100 MG tablet TAKE 3 TABLETS BY MOUTH 3 TIMES DAILY. 05/24/23   Tat, Octaviano Batty, DO  cetirizine (ZYRTEC) 10 MG tablet Take 10 mg by mouth daily.    [provider]  Cholecalciferol (VITAMIN D3) 1000 UNITS CAPS Take 1 capsule by mouth daily.    [provider]  clonazePAM (KLONOPIN) 0.5 MG tablet TAKE 1/2 TABLET BY MOUTH AT BEDTIME AND 1/2 DAILY AS NEEDED 06/20/23   Tat, Octaviano Batty, DO  donepezil (ARICEPT) 5 MG tablet TAKE 1 TABLET BY MOUTH EVERYDAY AT BEDTIME 06/20/23   Tat, Octaviano Batty, DO  escitalopram (LEXAPRO) 10 MG tablet TAKE 1 TABLET BY MOUTH EVERY DAY 06/20/23   Tat, Octaviano Batty, DO  feeding supplement (ENSURE COMPLETE) LIQD Take 237 mLs by mouth 2 (two) times daily between meals. 04/01/12   Alison Murray, MD  fluticasone [facility]) 50 MCG/ACT nasal spray Place 2 sprays into both nostrils daily. 10/02/20   [provider]  gabapentin (NEURONTIN) 100 MG capsule TAKE 1 CAPSULE BY MOUTH THREE TIMES A DAY 06/20/23  Tat, Octaviano Batty, DO  memantine (NAMENDA TITRATION PACK) tablet pack 5 mg/day x 1 week; 5 mg twice bid x 1 week; 5 mg in AM, 10 mg in PM x 1 week; then 10 mg twice daily 11/30/22   Tat, Octaviano Batty, DO  memantine (NAMENDA) 10 MG tablet Take 1 tablet (10 mg total) by mouth 2 (two) times daily. 11/30/22   Tat, Octaviano Batty, DO  memantine (NAMENDA) 10 MG tablet Take 1 pill twice a day 12/28/22   Tat, Octaviano Batty, DO  memantine (NAMENDA) 10 MG tablet Take 1 tablet (10 mg total) by mouth 2 (two) times daily. 01/07/23   Tat, Octaviano Batty, DO  mirtazapine (REMERON) 30 MG tablet TAKE 1 TABLET (30 MG TOTAL) BY MOUTH AT BEDTIME. FOR  SLEEP. 06/20/23   Doreene Nest, NP  montelukast (SINGULAIR) 10 MG tablet TAKE 1 TABLET (10 MG TOTAL) BY MOUTH AT BEDTIME. FOR ALLERGIES 05/01/23   Doreene Nest, NP  Multiple Vitamins-Minerals (CENTRUM SILVER ADULT 50+) TABS Take by mouth.    [provider]  naproxen sodium (ALEVE) 220 MG tablet Take 220 mg by mouth in the morning and at bedtime.    [provider]  rosuvastatin (CRESTOR) 5 MG tablet TAKE 1 TABLET (5 MG TOTAL) BY MOUTH EVERY EVENING. FOR CHOLESTEROL. 05/01/23   Doreene Nest, NP  vitamin B-12 (CYANOCOBALAMIN) 1000 MCG tablet Take 1,000 mcg by mouth daily.    [provider]  simvastatin (ZOCOR) 20 MG tablet TAKE 1 TABLET BY MOUTH EVERY DAY AT BEDTIME FOR CHOLESTEROL 10/01/19 01/02/20  Doreene Nest, NP      Allergies    Codeine sulfate, Penicillins, Amoxapine and related, and Wellbutrin [bupropion]    Review of Systems   Review of Systems  Physical Exam Updated Vital Signs BP (!) 169/71 (BP Location: Right Arm)   Pulse 63   Temp 98 F (36.7 C) (Oral)   Resp 18   SpO2 96%  Physical Exam Vitals and nursing note reviewed.  Constitutional:      General: She is not in acute distress.    Appearance: She is well-developed.     Comments: Alert and oriented x 2 (did not know year which may be baseline per patient's daughter)  HENT:     Head: Normocephalic and atraumatic.     Right Ear: External ear normal.     Left Ear: External ear normal.     Nose: Nose normal.  Eyes:     Extraocular Movements: Extraocular movements intact.     Conjunctiva/sclera: Conjunctivae normal.     Pupils: Pupils are equal, round, and reactive to light.     Comments: Pupils 3 mm bilaterally  Cardiovascular:     Rate and Rhythm: Normal rate and regular rhythm.     Heart sounds: No murmur heard. Pulmonary:     Effort: Pulmonary effort is normal. No respiratory distress.     Breath sounds: Normal breath sounds.  Musculoskeletal:     Cervical back:  Normal range of motion and neck supple.     Right lower leg: No edema.     Left lower leg: No edema.  Skin:    General: Skin is warm and dry.  Neurological:     Mental Status: She is alert. Mental status is at baseline.     Cranial Nerves: Cranial nerve deficit (Left-sided facial droop that involves the forehead.  This is chronic from Bell's palsy per the patient's daughter.) present.  Sensory: No sensory deficit.     Motor: No weakness.     Coordination: Coordination normal.     Gait: Gait normal.  Psychiatric:        Mood and Affect: Mood normal.     ED Results / Procedures / Treatments   Labs (all labs ordered are listed, but only abnormal results are displayed) Labs Reviewed - No data to display  EKG None  Radiology No results found.  Procedures Procedures  {Document cardiac monitor, telemetry assessment procedure when appropriate:1}  Medications Ordered in ED Medications - No data to display  ED Course/ Medical Decision Making/ A&P   {   Click here for ABCD2, HEART and other calculatorsREFRESH Note before signing :1}                              Medical Decision Making Amount and/or Complexity of Data Reviewed Labs: ordered. Radiology: ordered.   ***  {Document critical care time when appropriate:1} {Document review of labs and clinical decision tools ie heart score, Chads2Vasc2 etc:1}  {Document your independent review of radiology images, and any outside records:1} {Document your discussion with family members, caretakers, and with consultants:1} {Document social determinants of health affecting pt's care:1} {Document your decision making why or why not admission, treatments were needed:1} Final Clinical Impression(s) / ED Diagnoses Final diagnoses:  None    Rx / DC Orders ED Discharge Orders     None

## 2023-06-29 NOTE — ED Triage Notes (Signed)
Pt BIBA for weakness and increasing confusion x 2 weeks,becoming increasingly unstable when ambulating. Pt is A&Ox2, baseline with dementia.  Daughter at bedside lives with pt. States foul smelling urine recently.   Pt has L side droop from Bells Palsy.   CBG in route was 334- pt ate lunch right before calling EMS.    Pt is A&O x 2 and in no obvious distress at this time

## 2023-06-30 ENCOUNTER — Other Ambulatory Visit: Payer: Self-pay

## 2023-06-30 DIAGNOSIS — R531 Weakness: Secondary | ICD-10-CM | POA: Diagnosis not present

## 2023-06-30 DIAGNOSIS — N3000 Acute cystitis without hematuria: Secondary | ICD-10-CM | POA: Diagnosis not present

## 2023-06-30 DIAGNOSIS — E1165 Type 2 diabetes mellitus with hyperglycemia: Secondary | ICD-10-CM | POA: Diagnosis not present

## 2023-06-30 DIAGNOSIS — Z8669 Personal history of other diseases of the nervous system and sense organs: Secondary | ICD-10-CM | POA: Diagnosis not present

## 2023-06-30 LAB — CBC
HCT: 39.1 % (ref 36.0–46.0)
Hemoglobin: 12.1 g/dL (ref 12.0–15.0)
MCH: 29.4 pg (ref 26.0–34.0)
MCHC: 30.9 g/dL (ref 30.0–36.0)
MCV: 95.1 fL (ref 80.0–100.0)
Platelets: 196 10*3/uL (ref 150–400)
RBC: 4.11 MIL/uL (ref 3.87–5.11)
RDW: 14.1 % (ref 11.5–15.5)
WBC: 7.8 10*3/uL (ref 4.0–10.5)
nRBC: 0 % (ref 0.0–0.2)

## 2023-06-30 LAB — RENAL FUNCTION PANEL
Albumin: 3.1 g/dL — ABNORMAL LOW (ref 3.5–5.0)
Anion gap: 10 (ref 5–15)
BUN: 17 mg/dL (ref 8–23)
CO2: 25 mmol/L (ref 22–32)
Calcium: 9.1 mg/dL (ref 8.9–10.3)
Chloride: 105 mmol/L (ref 98–111)
Creatinine, Ser: 0.47 mg/dL (ref 0.44–1.00)
GFR, Estimated: >60 mL/min (ref >60)
Glucose, Bld: 169 mg/dL — ABNORMAL HIGH (ref 70–99)
Phosphorus: 2.9 mg/dL (ref 2.5–4.6)
Potassium: 3.8 mmol/L (ref 3.5–5.1)
Sodium: 140 mmol/L (ref 135–145)

## 2023-06-30 LAB — GLUCOSE, CAPILLARY
Glucose-Capillary: 152 mg/dL — ABNORMAL HIGH (ref 70–99)
Glucose-Capillary: 149 mg/dL — ABNORMAL HIGH (ref 70–99)
Glucose-Capillary: 136 mg/dL — ABNORMAL HIGH (ref 70–99)
Glucose-Capillary: 101 mg/dL — ABNORMAL HIGH (ref 70–99)

## 2023-06-30 LAB — TSH: TSH: 3.071 u[IU]/mL (ref 0.350–4.500)

## 2023-06-30 LAB — MAGNESIUM: Magnesium: 2 mg/dL (ref 1.7–2.4)

## 2023-06-30 MED ORDER — HYDRALAZINE HCL 20 MG/ML IJ SOLN: 5.0000 mg | Freq: Four times a day (QID) | INTRAMUSCULAR | Status: DC | PRN

## 2023-06-30 MED ORDER — SODIUM CHLORIDE 0.9 % IV SOLN
1.0000 g | INTRAVENOUS | Status: DC
  Administered 2023-06-30 – 2023-07-01 (×2): 1 g via INTRAVENOUS
  Filled 2023-06-30 (×3): qty 10

## 2023-06-30 MED ORDER — SODIUM CHLORIDE 0.45 % IV SOLN: INTRAVENOUS | Status: AC

## 2023-06-30 NOTE — Evaluation (Signed)
Occupational Therapy Evaluation Patient Details Name: [name] MRN: [id number] DOB: 1946/07/03 Today's Date: 06/30/2023   History of Present Illness 77 y.o. female with PMH of Parkinson's, dementia, Bell's palsy, anxiety, depression, HTN and DM-2 presented 12/4 with progressive generalized weakness, unsteady gait, confusion and decreased oral intake. Urinary tract infection.   Clinical Impression   Patient admitted for the diagnosis above.  PTA patient stating she lives with her daughter and granddaughter.  Patient stating she uses a 2WRW for mobility, and her daughter will assist as needed for ADL completion.  Patient is close to her baseline, but is needing CGA for transfers at a RW level, Min A for lower body ADL and initial Min A for sit to stand.  OT will follow in the acute setting to address deficits, mobility referral made, and no post acute OT is anticipated.  Recommend home with prior level of supports when cleared medically.         If plan is discharge home, recommend the following: Assist for transportation;Assistance with cooking/housework;A little help with bathing/dressing/bathroom;Direct supervision/assist for medications management;Supervision due to cognitive status    Functional Status Assessment  Patient has had a recent decline in their functional status and demonstrates the ability to make significant improvements in function in a reasonable and predictable amount of time.  Equipment Recommendations  None recommended by OT    Recommendations for Other Services       Precautions / Restrictions Precautions Precautions: Fall Restrictions Weight Bearing Restrictions: No      Mobility Bed Mobility Overal bed mobility: Needs Assistance Bed Mobility: Supine to Sit     Supine to sit: Min assist          Transfers Overall transfer level: Needs assistance   Transfers: Sit to/from Stand, Bed to chair/wheelchair/BSC Sit to Stand: Min assist      Step pivot transfers: Contact guard assist            Balance Overall balance assessment: Needs assistance Sitting-balance support: Feet supported Sitting balance-[name] Scale: Good     Standing balance support: Reliant on assistive device for balance Standing balance-[name] Scale: Poor                             ADL either performed or assessed with clinical judgement   ADL       Grooming: Wash/dry hands;Contact guard assist;Standing               Lower Body Dressing: Minimal assistance;Sit to/from stand   Toilet Transfer: Mudlogger Patient Visual Report: No change from baseline       Perception Perception: Not tested       Praxis Praxis: Not tested       Pertinent Vitals/Pain Pain Assessment Pain Assessment: No/denies pain     Extremity/Trunk Assessment Upper Extremity Assessment Upper Extremity Assessment: Overall WFL for tasks assessed   Lower Extremity Assessment Lower Extremity Assessment: Defer to PT evaluation   Cervical / Trunk Assessment Cervical / Trunk Assessment: Normal   Communication Communication Communication: No apparent difficulties   Cognition Arousal: Alert Behavior During Therapy: WFL for tasks assessed/performed Overall Cognitive Status: History of cognitive impairments - at baseline  General Comments: follows commands     General Comments   VSS on RA    Exercises     Shoulder Instructions      Home Living Family/patient expects to be discharged to:: Private residence Living Arrangements: Children Available Help at Discharge: Family;Available 24 hours/day Type of Home: House Home Access: Stairs to enter [employer] of Steps: 2 Entrance Stairs-Rails: Right;Left Home Layout: One level     Bathroom Shower/Tub: Chief Strategy Officer: Handicapped  height Bathroom Accessibility: Yes How Accessible: Accessible via walker Home Equipment: Rolling Walker (2 wheels);Cane - single point;Shower seat;Wheelchair - manual          Prior Functioning/Environment Prior Level of Function : Needs assist  Cognitive Assist : ADLs (cognitive)   ADLs (Cognitive): Set up cues Physical Assist : ADLs (physical)   ADLs (physical): Bathing;Dressing;Toileting   ADLs Comments: Patient states daughter assists as needed, generaly walks with her when she is up and moving.  Daughter completes iADL, meals, meds and community mobility.        OT Problem List: Impaired balance (sitting and/or standing)      OT Treatment/Interventions: Self-care/ADL training;Therapeutic activities    OT Goals(Current goals can be found in the care plan section) Acute Rehab OT Goals Patient Stated Goal: Return home OT Goal Formulation: With patient Time For Goal Achievement: 07/14/23 Potential to Achieve Goals: Good ADL Goals Pt Will Perform Grooming: with set-up;standing Pt Will Perform Lower Body Dressing: with contact guard assist;sit to/from stand Pt Will Transfer to Toilet: with supervision;ambulating;regular height toilet  OT Frequency: Min 1X/week    Co-evaluation              AM-PAC OT "6 Clicks" Daily Activity     Outcome Measure Help from another person eating meals?: None Help from another person taking care of personal grooming?: A Little Help from another person toileting, which includes using toliet, bedpan, or urinal?: A Little Help from another person bathing (including washing, rinsing, drying)?: A Little Help from another person to put on and taking off regular upper body clothing?: A Little Help from another person to put on and taking off regular lower body clothing?: A Little 6 Click Score: 19   End of Session Equipment Utilized During Treatment: Rolling walker (2 wheels) Nurse Communication: Mobility status  Activity Tolerance:  Patient tolerated treatment well Patient left: in chair;with call bell/phone within reach;with chair alarm set;with nursing/sitter in room  OT Visit Diagnosis: Unsteadiness on feet (R26.81)                Time: 1884-1660 OT Time Calculation (min): 21 min Charges:  OT General Charges $OT Visit: 1 Visit OT Evaluation $OT Eval Moderate Complexity: 1 Mod  06/30/2023  RP, OTR/L  Acute Rehabilitation Services  Office:  [phone]   [name] 06/30/2023, 12:38 PM

## 2023-06-30 NOTE — Progress Notes (Signed)
[facility] PROGRESS NOTE   DER FEI NWG:[id number] DOB: 1945/10/14 DOA: 06/29/2023  PCP: Doreene Nest, NP  Brief History: 77 y.o. female with PMH of Parkinson's, dementia, Bell's palsy, anxiety, depression, HTN and DM-2 presenting with progressive generalized weakness, unsteady gait, confusion and decreased oral intake.  UA was noted to be abnormal.  Patient was hospitalized for further management.  Consultants: None  Procedures: None    Subjective/Interval History: Patient mildly distracted but able to answer most questions appropriately.  She denies any abdominal pain nausea or vomiting.  No chest pain or shortness of breath.    Assessment/Plan:  Urinary tract infection UA noted to be grossly abnormal.  Patient however denies any dysuria but she does have dementia.  Empirically started on ceftriaxone. Follow-up on cultures.  Dementia/Parkinson's disease/acute metabolic encephalopathy Noted to have worsening mentation according to family.  No focal neurological deficits noted.  CT head without acute findings.  Confusion likely due to UTI.  PT evaluation. Continue her home medications.  Diabetes mellitus type 2, with hyperglycemia HbA1c 7.2 in October.  Monitor CBGs.  Will not be too aggressive with glycemic control.  SSI.  History of depression and anxiety Continue home medications.  Generalized weakness Likely due to UTI.  No other source of infection identified.  TSH is normal.  PT and OT evaluation.  Hypokalemia Corrected.  Magnesium 2.0.  DVT Prophylaxis: Lovenox Code Status: DNR Family Communication: No family at bedside.  Will update daughter later today Disposition Plan: To be determined  Status is: Inpatient Remains inpatient appropriate because: UTI, altered mental status    Medications: Scheduled:  aspirin EC  81 mg Oral Daily   carbidopa-levodopa  3 tablet Oral TID   cholecalciferol  1,000 Units Oral Daily   donepezil  5 mg  Oral QHS   enoxaparin (LOVENOX) injection  40 mg Subcutaneous Q24H   escitalopram  10 mg Oral Daily   feeding supplement  237 mL Oral BID BM   gabapentin  100 mg Oral TID   insulin aspart  0-5 Units Subcutaneous QHS   insulin aspart  0-9 Units Subcutaneous TID WC   loratadine  10 mg Oral QHS   memantine  10 mg Oral BID   mirtazapine  30 mg Oral QHS   montelukast  10 mg Oral QHS   rosuvastatin  5 mg Oral QPM   Continuous: VHQ:[id number]     Objective:  Vital Signs  Vitals:   06/29/23 2050 06/30/23 0157 06/30/23 0509 06/30/23 0915  BP: (!) 154/75 (!) 151/64 (!) 143/66 (!) 181/107  Pulse: 62 63 (!) 59 68  Resp: 18 18 18 19   Temp: 99.8 F (37.7 C) 98.3 F (36.8 C) 98.9 F (37.2 C) 97.6 F (36.4 C)  TempSrc: Oral Oral Oral Oral  SpO2: 98% 99% 98% 98%    Intake/Output Summary (Last 24 hours) at 06/30/2023 0949 Last data filed at 06/30/2023 0600 Gross per 24 hour  Intake 240 ml  Output 0 ml  Net 240 ml   There were no vitals filed for this visit.  General appearance: Awake alert.  In no distress.  Mildly distracted Resp: Clear to auscultation bilaterally.  Normal effort Cardio: S1-S2 is normal regular.  No S3-S4.  No rubs murmurs or bruit GI: Abdomen is soft.  Nontender nondistended.  Bowel sounds are present normal.  No masses organomegaly Extremities: No edema.  Full range of motion of lower extremities. Neurologic:  No focal neurological deficits.  Oriented to place  Lab Results:  Data Reviewed: I have personally reviewed following labs and reports of the imaging studies  CBC: Recent Labs  Lab 06/29/23 1440 06/30/23 0815  WBC 8.4 7.8  HGB 12.2 12.1  HCT 38.4 39.1  MCV 93.4 95.1  PLT 169 196    Basic Metabolic Panel: Recent Labs  Lab 06/29/23 1440 06/30/23 0815  NA 143 140  K 3.4* 3.8  CL 104 105  CO2 30 25  GLUCOSE 213* 169*  BUN 20 17  CREATININE 0.81 0.47  CALCIUM 9.3 9.1  MG 2.1 2.0  PHOS  --  2.9    GFR: CrCl cannot be calculated  (Unknown ideal weight.).  Liver Function Tests: Recent Labs  Lab 06/29/23 1440 06/30/23 0815  AST 10*  --   ALT 6  --   ALKPHOS 70  --   BILITOT 0.5  --   PROT 6.4*  --   ALBUMIN 3.5 3.1*    CBG: Recent Labs  Lab 06/29/23 1438 06/29/23 2223 06/30/23 0751  GLUCAP 184* 141* 152*    Thyroid Function Tests: Recent Labs    06/30/23 0815  TSH 3.071     Recent Results (from the past 240 hour(s))  Resp panel by RT-PCR (RSV, Flu A&B, Covid) Anterior Nasal Swab     Status: None   Collection Time: 06/29/23  2:07 PM   Specimen: Anterior Nasal Swab  Result Value Ref Range Status   SARS Coronavirus 2 by RT PCR NEGATIVE NEGATIVE Final    Comment: (NOTE) SARS-CoV-2 target nucleic acids are NOT DETECTED.  The SARS-CoV-2 RNA is generally detectable in upper respiratory specimens during the acute phase of infection. The lowest concentration of SARS-CoV-2 viral copies this assay can detect is 138 copies/mL. A negative result does not preclude SARS-Cov-2 infection and should not be used as the sole basis for treatment or other patient management decisions. A negative result may occur with  improper specimen collection/handling, submission of specimen other than nasopharyngeal swab, presence of viral mutation(s) within the areas targeted by this assay, and inadequate number of viral copies(<138 copies/mL). A negative result must be combined with clinical observations, patient history, and epidemiological information. The expected result is Negative.  Fact Sheet for Patients:  BloggerCourse.com  Fact Sheet for Healthcare Providers:  SeriousBroker.it  This test is no t yet approved or cleared by the [place] FDA and  has been authorized for detection and/or diagnosis of SARS-CoV-2 by FDA under an Emergency Use Authorization (EUA). This EUA will remain  in effect (meaning this test can be used) for the duration of  the COVID-19 declaration under Section 564(b)(1) of the Act, 21 U.S.C.section 360bbb-3(b)(1), unless the authorization is terminated  or revoked sooner.       Influenza A by PCR NEGATIVE NEGATIVE Final   Influenza B by PCR NEGATIVE NEGATIVE Final    Comment: (NOTE) The Xpert Xpress SARS-CoV-2/FLU/RSV plus assay is intended as an aid in the diagnosis of influenza from Nasopharyngeal swab specimens and should not be used as a sole basis for treatment. Nasal washings and aspirates are unacceptable for Xpert Xpress SARS-CoV-2/FLU/RSV testing.  Fact Sheet for Patients: BloggerCourse.com  Fact Sheet for Healthcare Providers: SeriousBroker.it  This test is not yet approved or cleared by the [place] FDA and has been authorized for detection and/or diagnosis of SARS-CoV-2 by FDA under an Emergency Use Authorization (EUA). This EUA will remain in effect (meaning this test can be used) for the duration of the COVID-19 declaration under Section  564(b)(1) of the Act, 21 U.S.C. section 360bbb-3(b)(1), unless the authorization is terminated or revoked.     Resp Syncytial Virus by PCR NEGATIVE NEGATIVE Final    Comment: (NOTE) Fact Sheet for Patients: BloggerCourse.com  Fact Sheet for Healthcare Providers: SeriousBroker.it  This test is not yet approved or cleared by the [place] FDA and has been authorized for detection and/or diagnosis of SARS-CoV-2 by FDA under an Emergency Use Authorization (EUA). This EUA will remain in effect (meaning this test can be used) for the duration of the COVID-19 declaration under Section 564(b)(1) of the Act, 21 U.S.C. section 360bbb-3(b)(1), unless the authorization is terminated or revoked.  Performed at [facility], [address], [place], [place] [address]       Radiology Studies: DG Chest 2 View  Result Date:  06/29/2023 CLINICAL DATA:  Two-week history of weakness and increasing confusion EXAM: CHEST - 2 VIEW COMPARISON:  Chest radiograph dated 05/28/2023 FINDINGS: Low lung volumes with bronchovascular crowding. No focal consolidations. No pleural effusion or pneumothorax. Similar mildly enlarged cardiomediastinal silhouette. No acute osseous abnormality. Partially imaged gastric band. IMPRESSION: 1. Low lung volumes with bronchovascular crowding. No focal consolidations. 2. Similar mild cardiomegaly. Electronically Signed   By: Agustin Cree M.D.   On: 06/29/2023 16:16   CT Head Wo Contrast  Result Date: 06/29/2023 CLINICAL DATA:  Altered mental status EXAM: CT HEAD WITHOUT CONTRAST TECHNIQUE: Contiguous axial images were obtained from the base of the skull through the vertex without intravenous contrast. RADIATION DOSE REDUCTION: This exam was performed according to the departmental dose-optimization program which includes automated exposure control, adjustment of the mA and/or kV according to patient size and/or use of iterative reconstruction technique. COMPARISON:  06/03/2023 FINDINGS: Brain: No evidence of acute infarction, hemorrhage, mass, mass effect, or midline shift. No hydrocephalus or extra-axial fluid collection. Mildly advanced cerebral atrophy for age. Periventricular white matter changes, likely the sequela of chronic small vessel ischemic disease. Vascular: No hyperdense vessel. Skull: Negative for fracture or focal lesion. Sinuses/Orbits: Sequela of prior endoscopic sinus surgery. Mucosal thickening in the right maxillary sinus, with osseous thickening compatible with chronic right maxillary sinusitis. Mild mucosal thickening right sphenoid sinus and bilateral ethmoid air cells. Status post bilateral lens replacements. Other: The mastoid air cells are well aerated. IMPRESSION: No acute intracranial process. Electronically Signed   By: Wiliam Ke M.D.   On: 06/29/2023 14:57       LOS: 1 day    Cullen Vanallen  [facility] Pager on [email or web address]  06/30/2023, 9:49 AM

## 2023-07-01 DIAGNOSIS — N3000 Acute cystitis without hematuria: Secondary | ICD-10-CM | POA: Diagnosis not present

## 2023-07-01 DIAGNOSIS — E1165 Type 2 diabetes mellitus with hyperglycemia: Secondary | ICD-10-CM | POA: Diagnosis not present

## 2023-07-01 DIAGNOSIS — F0390 Unspecified dementia without behavioral disturbance: Secondary | ICD-10-CM | POA: Diagnosis not present

## 2023-07-01 LAB — GLUCOSE, CAPILLARY
Glucose-Capillary: 134 mg/dL — ABNORMAL HIGH (ref 70–99)
Glucose-Capillary: 184 mg/dL — ABNORMAL HIGH (ref 70–99)
Glucose-Capillary: 153 mg/dL — ABNORMAL HIGH (ref 70–99)
Glucose-Capillary: 144 mg/dL — ABNORMAL HIGH (ref 70–99)

## 2023-07-01 NOTE — Progress Notes (Signed)
[facility] PROGRESS NOTE   [name] NUU:[id number] DOB: 06/22/1946 DOA: 06/29/2023  PCP: Doreene Nest, NP  Brief History: 77 y.o. female with PMH of Parkinson's, dementia, Bell's palsy, anxiety, depression, HTN and DM-2 presenting with progressive generalized weakness, unsteady gait, confusion and decreased oral intake.  UA was noted to be abnormal.  Patient was hospitalized for further management.  Consultants: None  Procedures: None    Subjective/Interval History: Patient noted to be more awake alert today compared to yesterday.  Still somewhat distracted.  Denies any complaints this morning.      Assessment/Plan:  Urinary tract infection UA noted to be grossly abnormal.  Patient however denies any dysuria but she does have dementia.  It does not appear that she got any antibiotics on 12/4.  She was given ceftriaxone starting 12/5.  Urine culture growing gram-negative bacteria.  Await final identification and sensitivities.    Dementia/Parkinson's disease/acute metabolic encephalopathy Noted to have worsening mentation according to family.  No focal neurological deficits noted.  CT head without acute findings.  Confusion likely due to UTI.   PT evaluation. Continue her home medications.  Diabetes mellitus type 2, with hyperglycemia HbA1c 7.2 in October.  Monitor CBGs.  Will not be too aggressive with glycemic control.  SSI.  History of depression and anxiety Continue home medications.  Generalized weakness Likely due to UTI.  No other source of infection identified.  TSH is normal.  PT and OT evaluation.  Hypokalemia Corrected.  Magnesium 2.0.  DVT Prophylaxis: Lovenox Code Status: DNR Family Communication: Daughter was updated yesterday. Disposition Plan: Looks like she did fairly well with OT.  Await PT eval.  Status is: Inpatient Remains inpatient appropriate because: UTI, altered mental status    Medications: Scheduled:  aspirin EC   81 mg Oral Daily   carbidopa-levodopa  3 tablet Oral TID   cholecalciferol  1,000 Units Oral Daily   donepezil  5 mg Oral QHS   enoxaparin (LOVENOX) injection  40 mg Subcutaneous Q24H   escitalopram  10 mg Oral Daily   feeding supplement  237 mL Oral BID BM   gabapentin  100 mg Oral TID   insulin aspart  0-9 Units Subcutaneous TID WC   loratadine  10 mg Oral QHS   memantine  10 mg Oral BID   mirtazapine  30 mg Oral QHS   montelukast  10 mg Oral QHS   rosuvastatin  5 mg Oral QPM   Continuous:  sodium chloride 75 mL/hr at 06/30/23 1132   cefTRIAXone (ROCEPHIN)  IV 1 g (06/30/23 1143)   [id number], hydrALAZINE     Objective:  Vital Signs  Vitals:   06/30/23 1609 06/30/23 1633 06/30/23 2114 07/01/23 0520  BP: (!) 147/65  131/62 (!) 162/78  Pulse: 62  61 69  Resp:   17 17  Temp: 98.9 F (37.2 C)  99.1 F (37.3 C) 99.6 F (37.6 C)  TempSrc: Oral  Oral Oral  SpO2: 100% 99% 93% 93%  Weight:      Height:        Intake/Output Summary (Last 24 hours) at 07/01/2023 1028 Last data filed at 07/01/2023 0400 Gross per 24 hour  Intake 1293.16 ml  Output --  Net 1293.16 ml   Filed Weights   06/30/23 1420  Weight: 67.5 kg    General appearance: Awake alert.  In no distress.  Distracted Resp: Clear to auscultation bilaterally.  Normal effort Cardio: S1-S2 is normal regular.  No S3-S4.  No rubs murmurs or bruit GI: Abdomen is soft.  Nontender nondistended.  Bowel sounds are present normal.  No masses organomegaly    Lab Results:  Data Reviewed: I have personally reviewed following labs and reports of the imaging studies  CBC: Recent Labs  Lab 06/29/23 1440 06/30/23 0815  WBC 8.4 7.8  HGB 12.2 12.1  HCT 38.4 39.1  MCV 93.4 95.1  PLT 169 196    Basic Metabolic Panel: Recent Labs  Lab 06/29/23 1440 06/30/23 0815  NA 143 140  K 3.4* 3.8  CL 104 105  CO2 30 25  GLUCOSE 213* 169*  BUN 20 17  CREATININE 0.81 0.47  CALCIUM 9.3 9.1  MG 2.1 2.0  PHOS   --  2.9    GFR: Estimated Creatinine Clearance: 51 mL/min (by C-G formula based on SCr of 0.47 mg/dL).  Liver Function Tests: Recent Labs  Lab 06/29/23 1440 06/30/23 0815  AST 10*  --   ALT 6  --   ALKPHOS 70  --   BILITOT 0.5  --   PROT 6.4*  --   ALBUMIN 3.5 3.1*    CBG: Recent Labs  Lab 06/30/23 0751 06/30/23 1145 06/30/23 1709 06/30/23 2114 07/01/23 0718  GLUCAP 152* 149* 136* 101* 134*    Thyroid Function Tests: Recent Labs    06/30/23 0815  TSH 3.071     Recent Results (from the past 240 hour(s))  Resp panel by RT-PCR (RSV, Flu A&B, Covid) Anterior Nasal Swab     Status: None   Collection Time: 06/29/23  2:07 PM   Specimen: Anterior Nasal Swab  Result Value Ref Range Status   SARS Coronavirus 2 by RT PCR NEGATIVE NEGATIVE Final    Comment: (NOTE) SARS-CoV-2 target nucleic acids are NOT DETECTED.  The SARS-CoV-2 RNA is generally detectable in upper respiratory specimens during the acute phase of infection. The lowest concentration of SARS-CoV-2 viral copies this assay can detect is 138 copies/mL. A negative result does not preclude SARS-Cov-2 infection and should not be used as the sole basis for treatment or other patient management decisions. A negative result may occur with  improper specimen collection/handling, submission of specimen other than nasopharyngeal swab, presence of viral mutation(s) within the areas targeted by this assay, and inadequate number of viral copies(<138 copies/mL). A negative result must be combined with clinical observations, patient history, and epidemiological information. The expected result is Negative.  Fact Sheet for Patients:  BloggerCourse.com  Fact Sheet for Healthcare Providers:  SeriousBroker.it  This test is no t yet approved or cleared by the [place] FDA and  has been authorized for detection and/or diagnosis of SARS-CoV-2 by FDA under an Emergency  Use Authorization (EUA). This EUA will remain  in effect (meaning this test can be used) for the duration of the COVID-19 declaration under Section 564(b)(1) of the Act, 21 U.S.C.section 360bbb-3(b)(1), unless the authorization is terminated  or revoked sooner.       Influenza A by PCR NEGATIVE NEGATIVE Final   Influenza B by PCR NEGATIVE NEGATIVE Final    Comment: (NOTE) The Xpert Xpress SARS-CoV-2/FLU/RSV plus assay is intended as an aid in the diagnosis of influenza from Nasopharyngeal swab specimens and should not be used as a sole basis for treatment. Nasal washings and aspirates are unacceptable for Xpert Xpress SARS-CoV-2/FLU/RSV testing.  Fact Sheet for Patients: BloggerCourse.com  Fact Sheet for Healthcare Providers: SeriousBroker.it  This test is not yet approved or cleared by the [place] and  has been authorized for detection and/or diagnosis of SARS-CoV-2 by FDA under an Emergency Use Authorization (EUA). This EUA will remain in effect (meaning this test can be used) for the duration of the COVID-19 declaration under Section 564(b)(1) of the Act, 21 U.S.C. section 360bbb-3(b)(1), unless the authorization is terminated or revoked.     Resp Syncytial Virus by PCR NEGATIVE NEGATIVE Final    Comment: (NOTE) Fact Sheet for Patients: BloggerCourse.com  Fact Sheet for Healthcare Providers: SeriousBroker.it  This test is not yet approved or cleared by the [place] FDA and has been authorized for detection and/or diagnosis of SARS-CoV-2 by FDA under an Emergency Use Authorization (EUA). This EUA will remain in effect (meaning this test can be used) for the duration of the COVID-19 declaration under Section 564(b)(1) of the Act, 21 U.S.C. section 360bbb-3(b)(1), unless the authorization is terminated or revoked.  Performed at [facility],  [address] [name], [place] [address]   Urine Culture     Status: Abnormal (Preliminary result)   Collection Time: 06/29/23  2:07 PM   Specimen: Urine, Clean Catch  Result Value Ref Range Status   Specimen Description   Final    URINE, CLEAN CATCH Performed at [facility], [address] [place], [place] [address]    Special Requests   Final    NONE Performed at [facility], [address] [place], [place] [address]    Culture (A)  Final    >=100,000 COLONIES/mL GRAM NEGATIVE RODS SUSCEPTIBILITIES TO FOLLOW Performed at [facility], [address] [place], [place] [address]    Report Status PENDING  Incomplete      Radiology Studies: DG Chest 2 View  Result Date: 06/29/2023 CLINICAL DATA:  Two-week history of weakness and increasing confusion EXAM: CHEST - 2 VIEW COMPARISON:  Chest radiograph dated 05/28/2023 FINDINGS: Low lung volumes with bronchovascular crowding. No focal consolidations. No pleural effusion or pneumothorax. Similar mildly enlarged cardiomediastinal silhouette. No acute osseous abnormality. Partially imaged gastric band. IMPRESSION: 1. Low lung volumes with bronchovascular crowding. No focal consolidations. 2. Similar mild cardiomegaly. Electronically Signed   By: Agustin Cree M.D.   On: 06/29/2023 16:16   CT Head Wo Contrast  Result Date: 06/29/2023 CLINICAL DATA:  Altered mental status EXAM: CT HEAD WITHOUT CONTRAST TECHNIQUE: Contiguous axial images were obtained from the base of the skull through the vertex without intravenous contrast. RADIATION DOSE REDUCTION: This exam was performed according to the departmental dose-optimization program which includes automated exposure control, adjustment of the mA and/or kV according to patient size and/or use of iterative reconstruction technique. COMPARISON:  06/03/2023 FINDINGS: Brain: No evidence of acute infarction, hemorrhage, mass, mass effect, or midline shift. No  hydrocephalus or extra-axial fluid collection. Mildly advanced cerebral atrophy for age. Periventricular white matter changes, likely the sequela of chronic small vessel ischemic disease. Vascular: No hyperdense vessel. Skull: Negative for fracture or focal lesion. Sinuses/Orbits: Sequela of prior endoscopic sinus surgery. Mucosal thickening in the right maxillary sinus, with osseous thickening compatible with chronic right maxillary sinusitis. Mild mucosal thickening right sphenoid sinus and bilateral ethmoid air cells. Status post bilateral lens replacements. Other: The mastoid air cells are well aerated. IMPRESSION: No acute intracranial process. Electronically Signed   By: Wiliam Ke M.D.   On: 06/29/2023 14:57       LOS: 2 days   Elayah Klooster Rito Ehrlich  [facility] Pager on [email or web address]  07/01/2023, 10:28 AM

## 2023-07-01 NOTE — Plan of Care (Signed)

## 2023-07-01 NOTE — Evaluation (Signed)
Physical Therapy One Time Evaluation Patient Details Name: [name] MRN: [id number] DOB: [birth year] Today's Date: 07/01/2023  History of Present Illness  77 y.o. female presenting with progressive generalized weakness, unsteady gait, confusion and decreased oral intake and admitted 06/29/23 for UTI.  PMH of Parkinson's, dementia, Bell's palsy, anxiety, depression, HTN and DM-2  Clinical Impression  Patient evaluated by Physical Therapy with no further acute PT needs identified. All education has been completed and the patient has no further questions.  Pt very pleasant and agreeable to ambulate.  Pt reports her family assists her as needed and she uses either a cane or RW.  Pt reports she feels at baseline in regards to mobility.  No further PT or equipment needs identified at this time however recommend staff continue to assist pt with mobilizing during acute stay. PT is signing off. Thank you for this referral.         If plan is discharge home, recommend the following:     Can travel by private vehicle        Equipment Recommendations None recommended by PT  Recommendations for Other Services       Functional Status Assessment Patient has not had a recent decline in their functional status     Precautions / Restrictions Precautions Precautions: Fall      Mobility  Bed Mobility Overal bed mobility: Needs Assistance Bed Mobility: Supine to Sit     Supine to sit: Supervision, HOB elevated          Transfers Overall transfer level: Needs assistance Equipment used: Rolling walker (2 wheels) Transfers: Sit to/from Stand Sit to Stand: Contact guard assist           General transfer comment: a couple attempts to rise but able to perform without physical assist, reliant on UE assist/support    Ambulation/Gait Ambulation/Gait assistance: Contact guard assist Gait Distance (Feet): 160 Feet Assistive device: Rolling walker (2 wheels) Gait Pattern/deviations:  Step-through pattern, Decreased stride length, Trunk flexed Gait velocity: decr     General Gait Details: cues for RW positioning; veers to right; pt reports feeling at baseline  Stairs            Wheelchair Mobility     Tilt Bed    Modified Rankin (Stroke Patients Only)       Balance Overall balance assessment: Needs assistance, History of Falls (denies any recent falls but does report hx of falls)         Standing balance support: Reliant on assistive device for balance, Bilateral upper extremity supported, During functional activity Standing balance-Leahy Scale: Poor                               Pertinent Vitals/Pain Pain Assessment Pain Assessment: No/denies pain    Home Living Family/patient expects to be discharged to:: Private residence Living Arrangements: Children Available Help at Discharge: Family;Available 24 hours/day Type of Home: House Home Access: Stairs to enter Entrance Stairs-Rails: Doctor, general practice of Steps: 2   Home Layout: One level Home Equipment: Agricultural consultant (2 wheels);Cane - single point;Shower seat;Wheelchair - manual      Prior Function Prior Level of Function : Needs assist  Cognitive Assist : ADLs (cognitive)   ADLs (Cognitive): Set up cues Physical Assist : ADLs (physical)   ADLs (physical): Bathing;Dressing;Toileting Mobility Comments: uses SPC or RW ADLs Comments: Patient states daughter assists as needed,  Daughter completes iADL, meals,  meds and community mobility.     Extremity/Trunk Assessment        Lower Extremity Assessment Lower Extremity Assessment: Overall WFL for tasks assessed    Cervical / Trunk Assessment Cervical / Trunk Assessment: Normal  Communication   Communication Communication: No apparent difficulties  Cognition Arousal: Alert Behavior During Therapy: WFL for tasks assessed/performed Overall Cognitive Status: History of cognitive impairments - at  baseline                                 General Comments: pleasant, conversant, follows commands        General Comments      Exercises     Assessment/Plan    PT Assessment Patient does not need any further PT services  PT Problem List         PT Treatment Interventions      PT Goals (Current goals can be found in the Care Plan section)  Acute Rehab PT Goals PT Goal Formulation: All assessment and education complete, DC therapy    Frequency       Co-evaluation               AM-PAC PT "6 Clicks" Mobility  Outcome Measure Help needed turning from your back to your side while in a flat bed without using bedrails?: A Little Help needed moving from lying on your back to sitting on the side of a flat bed without using bedrails?: A Little Help needed moving to and from a bed to a chair (including a wheelchair)?: A Little Help needed standing up from a chair using your arms (e.g., wheelchair or bedside chair)?: A Little Help needed to walk in hospital room?: A Little Help needed climbing 3-5 steps with a railing? : A Little 6 Click Score: 18    End of Session Equipment Utilized During Treatment: Gait belt Activity Tolerance: Patient tolerated treatment well Patient left: in chair;with call bell/phone within reach;with chair alarm set;with nursing/sitter in room Nurse Communication: Mobility status PT Visit Diagnosis: Difficulty in walking, not elsewhere classified (R26.2)    Time: 6962-9528 PT Time Calculation (min) (ACUTE ONLY): 18 min   Charges:   PT Evaluation $PT Eval Low Complexity: 1 Low   PT General Charges $$ ACUTE PT VISIT: 1 Visit        Thomasene Mohair PT, DPT Physical Therapist Acute Rehabilitation Services Office: [phone]   Kati L Payson 07/01/2023, 1:06 PM

## 2023-07-01 NOTE — TOC Transition Note (Signed)
Transition of Care [facility]) - CM/SW Discharge Note   Patient Details  Name: [name] MRN: [id number] Date of Birth: 05/02/1946  [facility] CM/SW Contact:  Amada Jupiter, LCSW Phone Number: 07/01/2023, 3:52 PM   Clinical Narrative:     Met with pt and family following PT evaluation.  No follow up therapy recommended and family agrees that pt very close to baseline.  They have needed DME in the home.  No further TOC needs.  Final next level of care: Home/Self Care Barriers to Discharge: No Barriers Identified   Patient Goals and CMS Choice      Discharge Placement                         Discharge Plan and Services Additional resources added to the After Visit Summary for                  DME Arranged: N/A DME Agency: NA       HH Arranged: NA HH Agency: NA        Social Determinants of Health (SDOH) Interventions SDOH Screenings   Food Insecurity: No Food Insecurity (06/29/2023)  Housing: Low Risk  (06/29/2023)  Transportation Needs: No Transportation Needs (06/29/2023)  Utilities: Not At Risk (06/29/2023)  Alcohol Screen: Low Risk  (06/10/2023)  Depression (PHQ2-9): Low Risk  (06/10/2023)  Recent Concern: Depression (PHQ2-9) - High Risk (05/31/2023)  Financial Resource Strain: Low Risk  (06/10/2023)  Physical Activity: Inactive (06/10/2023)  Social Connections: Socially Isolated (06/10/2023)  Stress: No Stress Concern Present (06/10/2023)  Tobacco Use: Low Risk  (06/29/2023)  Health Literacy: Inadequate Health Literacy (06/10/2023)     Readmission Risk Interventions    07/01/2023    3:51 PM  Readmission Risk Prevention Plan  Post Dischage Appt Complete  Medication Screening Complete  Transportation Screening Complete

## 2023-07-02 ENCOUNTER — Other Ambulatory Visit (HOSPITAL_COMMUNITY): Payer: Self-pay

## 2023-07-02 DIAGNOSIS — N3000 Acute cystitis without hematuria: Secondary | ICD-10-CM | POA: Diagnosis not present

## 2023-07-02 LAB — BASIC METABOLIC PANEL
Anion gap: 7 (ref 5–15)
BUN: 14 mg/dL (ref 8–23)
CO2: 27 mmol/L (ref 22–32)
Calcium: 8.9 mg/dL (ref 8.9–10.3)
Chloride: 103 mmol/L (ref 98–111)
Creatinine, Ser: 0.41 mg/dL — ABNORMAL LOW (ref 0.44–1.00)
GFR, Estimated: >60 mL/min (ref >60)
Glucose, Bld: 123 mg/dL — ABNORMAL HIGH (ref 70–99)
Potassium: 3.2 mmol/L — ABNORMAL LOW (ref 3.5–5.1)
Sodium: 137 mmol/L (ref 135–145)

## 2023-07-02 LAB — URINE CULTURE: Culture: >=100000 — AB

## 2023-07-02 LAB — CBC
HCT: 35.3 % — ABNORMAL LOW (ref 36.0–46.0)
Hemoglobin: 11 g/dL — ABNORMAL LOW (ref 12.0–15.0)
MCH: 29 pg (ref 26.0–34.0)
MCHC: 31.2 g/dL (ref 30.0–36.0)
MCV: 93.1 fL (ref 80.0–100.0)
Platelets: 172 10*3/uL (ref 150–400)
RBC: 3.79 MIL/uL — ABNORMAL LOW (ref 3.87–5.11)
RDW: 13.9 % (ref 11.5–15.5)
WBC: 7.8 10*3/uL (ref 4.0–10.5)
nRBC: 0 % (ref 0.0–0.2)

## 2023-07-02 LAB — GLUCOSE, CAPILLARY
Glucose-Capillary: 118 mg/dL — ABNORMAL HIGH (ref 70–99)
Glucose-Capillary: 136 mg/dL — ABNORMAL HIGH (ref 70–99)

## 2023-07-02 MED ORDER — NITROFURANTOIN MONOHYD MACRO 100 MG PO CAPS
100.0000 mg | ORAL_CAPSULE | Freq: Two times a day (BID) | ORAL | 0 refills | Status: AC
  Filled 2023-07-02: qty 10, 5d supply, fill #0

## 2023-07-02 MED ORDER — CEFADROXIL 500 MG PO CAPS
500.0000 mg | ORAL_CAPSULE | Freq: Two times a day (BID) | ORAL | 0 refills | Status: DC
  Filled 2023-07-02: qty 8, 4d supply, fill #0

## 2023-07-02 MED ORDER — POTASSIUM CHLORIDE CRYS ER 20 MEQ PO TBCR
40.0000 meq | EXTENDED_RELEASE_TABLET | ORAL | Status: AC
  Administered 2023-07-02 (×2): 40 meq via ORAL
  Filled 2023-07-02 (×2): qty 2

## 2023-07-02 MED ORDER — CEFADROXIL 500 MG PO CAPS: 500.0000 mg | ORAL_CAPSULE | Freq: Two times a day (BID) | ORAL | Status: DC

## 2023-07-02 MED ORDER — NITROFURANTOIN MONOHYD MACRO 100 MG PO CAPS
100.0000 mg | ORAL_CAPSULE | Freq: Two times a day (BID) | ORAL | Status: DC
  Administered 2023-07-02: 100 mg via ORAL
  Filled 2023-07-02 (×2): qty 1

## 2023-07-02 NOTE — Plan of Care (Signed)
  Problem: Nutritional: Goal: Maintenance of adequate nutrition will improve Outcome: Progressing   Problem: Nutrition: Goal: Adequate nutrition will be maintained Outcome: Progressing   Problem: Coping: Goal: Level of anxiety will decrease Outcome: Progressing

## 2023-07-02 NOTE — Plan of Care (Signed)

## 2023-07-02 NOTE — Discharge Summary (Signed)
[facility]  Physician Discharge Summary   Patient ID: [name] MRN: [id number] DOB/AGE: 01-Nov-1945 77 y.o.  Admit date: 06/29/2023 Discharge date: 07/02/2023    PCP: Doreene Nest, NP  DISCHARGE DIAGNOSES:  Acute cystitis   ANXIETY DEPRESSION   HYPERTENSION, BENIGN ESSENTIAL   Type 2 diabetes mellitus with hyperglycemia (HCC)   Dementia without behavioral disturbance (HCC)   History of Parkinson disease   RECOMMENDATIONS FOR OUTPATIENT FOLLOW UP: Follow-up with PCP 1 week after discharge   Home Health: None Equipment/Devices: None  CODE STATUS: DNR  DISCHARGE CONDITION: fair  Diet recommendation: As before  INITIAL HISTORY: 77 y.o. female with PMH of Parkinson's, dementia, Bell's palsy, anxiety, depression, HTN and DM-2 presenting with progressive generalized weakness, unsteady gait, confusion and decreased oral intake.  UA was noted to be abnormal.  Patient was hospitalized for further management.   HOSPITAL COURSE:   Acute cystitis UA noted to be grossly abnormal.  Patient however denies any dysuria but she does have dementia.  Patient was started on ceftriaxone.  Urine culture growing E. coli.  Patient with anaphylactic reaction to penicillins previously.  She did tolerate cephalosporins here but due to high cross-reactivity especially with for generation cephalosporin it was decided to send her out on Macrobid since she has shown improvement over the last 2 days.  Did not have any systemic symptoms.  And this appears to be a cystitis more than pyelonephritis.      Dementia/Parkinson's disease/acute metabolic encephalopathy Noted to have worsening mentation according to family.  No focal neurological deficits noted.  CT head without acute findings.  Confusion likely due to UTI.   Now back to baseline   Diabetes mellitus type 2, with hyperglycemia   History of depression and anxiety Continue home medications.   Generalized weakness Likely due  to UTI.  No other source of infection identified.  TSH is normal.  Seen by PT and OT.  No needs identified.   Hypokalemia Corrected.  Magnesium 2.0.  Patient stable.  Okay for discharge home today.  Discussed with her daughter.   PERTINENT LABS:  The results of significant diagnostics from this hospitalization (including imaging, microbiology, ancillary and laboratory) are listed below for reference.    Microbiology: Recent Results (from the past 240 hour(s))  Resp panel by RT-PCR (RSV, Flu A&B, Covid) Anterior Nasal Swab     Status: None   Collection Time: 06/29/23  2:07 PM   Specimen: Anterior Nasal Swab  Result Value Ref Range Status   SARS Coronavirus 2 by RT PCR NEGATIVE NEGATIVE Final    Comment: (NOTE) SARS-CoV-2 target nucleic acids are NOT DETECTED.  The SARS-CoV-2 RNA is generally detectable in upper respiratory specimens during the acute phase of infection. The lowest concentration of SARS-CoV-2 viral copies this assay can detect is 138 copies/mL. A negative result does not preclude SARS-Cov-2 infection and should not be used as the sole basis for treatment or other patient management decisions. A negative result may occur with  improper specimen collection/handling, submission of specimen other than nasopharyngeal swab, presence of viral mutation(s) within the areas targeted by this assay, and inadequate number of viral copies(<138 copies/mL). A negative result must be combined with clinical observations, patient history, and epidemiological information. The expected result is Negative.  Fact Sheet for Patients:  BloggerCourse.com  Fact Sheet for Healthcare Providers:  SeriousBroker.it  This test is no t yet approved or cleared by the [place] FDA and  has been authorized for detection and/or  diagnosis of SARS-CoV-2 by FDA under an Emergency Use Authorization (EUA). This EUA will remain  in effect (meaning  this test can be used) for the duration of the COVID-19 declaration under Section 564(b)(1) of the Act, 21 U.S.C.section 360bbb-3(b)(1), unless the authorization is terminated  or revoked sooner.       Influenza A by PCR NEGATIVE NEGATIVE Final   Influenza B by PCR NEGATIVE NEGATIVE Final    Comment: (NOTE) The Xpert Xpress SARS-CoV-2/FLU/RSV plus assay is intended as an aid in the diagnosis of influenza from Nasopharyngeal swab specimens and should not be used as a sole basis for treatment. Nasal washings and aspirates are unacceptable for Xpert Xpress SARS-CoV-2/FLU/RSV testing.  Fact Sheet for Patients: BloggerCourse.com  Fact Sheet for Healthcare Providers: SeriousBroker.it  This test is not yet approved or cleared by the [place] FDA and has been authorized for detection and/or diagnosis of SARS-CoV-2 by FDA under an Emergency Use Authorization (EUA). This EUA will remain in effect (meaning this test can be used) for the duration of the COVID-19 declaration under Section 564(b)(1) of the Act, 21 U.S.C. section 360bbb-3(b)(1), unless the authorization is terminated or revoked.     Resp Syncytial Virus by PCR NEGATIVE NEGATIVE Final    Comment: (NOTE) Fact Sheet for Patients: BloggerCourse.com  Fact Sheet for Healthcare Providers: SeriousBroker.it  This test is not yet approved or cleared by the [place] FDA and has been authorized for detection and/or diagnosis of SARS-CoV-2 by FDA under an Emergency Use Authorization (EUA). This EUA will remain in effect (meaning this test can be used) for the duration of the COVID-19 declaration under Section 564(b)(1) of the Act, 21 U.S.C. section 360bbb-3(b)(1), unless the authorization is terminated or revoked.  Performed at [facility], [address], [place], [place] [address]   Urine Culture      Status: Abnormal   Collection Time: 06/29/23  2:07 PM   Specimen: Urine, Clean Catch  Result Value Ref Range Status   Specimen Description URINE, CLEAN CATCH  Final   Special Requests NONE  Final   Culture >=100,000 COLONIES/mL ESCHERICHIA COLI (A)  Final   Report Status 07/02/2023 FINAL  Final   Organism ID, Bacteria ESCHERICHIA COLI (A)  Final      Susceptibility   Escherichia coli - MIC*    AMPICILLIN >=32 RESISTANT Resistant     CEFEPIME <=0.12 SENSITIVE Sensitive     CEFTRIAXONE <=0.25 SENSITIVE Sensitive     CIPROFLOXACIN <=0.25 SENSITIVE Sensitive     GENTAMICIN <=1 SENSITIVE Sensitive     IMIPENEM <=0.25 SENSITIVE Sensitive     TRIMETH/SULFA >=320 RESISTANT Resistant     AMPICILLIN/SULBACTAM >=32 RESISTANT Resistant     PIP/TAZO 8 SENSITIVE Sensitive ug/mL    CEFAZOLIN Value in next row Intermediate      8 INTERMEDIATEPerformed at [facility], [address], [place], [place] [address]    * >=100,000 COLONIES/mL ESCHERICHIA COLI     Labs:   Basic Metabolic Panel: Recent Labs  Lab 06/29/23 1440 06/30/23 0815 07/02/23 0315  NA 143 140 137  K 3.4* 3.8 3.2*  CL 104 105 103  CO2 30 25 27   GLUCOSE 213* 169* 123*  BUN 20 17 14   CREATININE 0.81 0.47 0.41*  CALCIUM 9.3 9.1 8.9  MG 2.1 2.0  --   PHOS  --  2.9  --    Liver Function Tests: Recent Labs  Lab 06/29/23 1440 06/30/23 0815  AST 10*  --  ALT 6  --   ALKPHOS 70  --   BILITOT 0.5  --   PROT 6.4*  --   ALBUMIN 3.5 3.1*   CBC: Recent Labs  Lab 06/29/23 1440 06/30/23 0815 07/02/23 0315  WBC 8.4 7.8 7.8  HGB 12.2 12.1 11.0*  HCT 38.4 39.1 35.3*  MCV 93.4 95.1 93.1  PLT 169 196 172   CBG: Recent Labs  Lab 07/01/23 1112 07/01/23 1724 07/01/23 2200 07/02/23 0745 07/02/23 1124  GLUCAP 184* 153* 144* 118* 136*     IMAGING STUDIES DG Chest 2 View  Result Date: 06/29/2023 CLINICAL DATA:  Two-week history of weakness and increasing confusion EXAM: CHEST - 2 VIEW COMPARISON:  Chest  radiograph dated 05/28/2023 FINDINGS: Low lung volumes with bronchovascular crowding. No focal consolidations. No pleural effusion or pneumothorax. Similar mildly enlarged cardiomediastinal silhouette. No acute osseous abnormality. Partially imaged gastric band. IMPRESSION: 1. Low lung volumes with bronchovascular crowding. No focal consolidations. 2. Similar mild cardiomegaly. Electronically Signed   By: Agustin Cree M.D.   On: 06/29/2023 16:16   CT Head Wo Contrast  Result Date: 06/29/2023 CLINICAL DATA:  Altered mental status EXAM: CT HEAD WITHOUT CONTRAST TECHNIQUE: Contiguous axial images were obtained from the base of the skull through the vertex without intravenous contrast. RADIATION DOSE REDUCTION: This exam was performed according to the departmental dose-optimization program which includes automated exposure control, adjustment of the mA and/or kV according to patient size and/or use of iterative reconstruction technique. COMPARISON:  06/03/2023 FINDINGS: Brain: No evidence of acute infarction, hemorrhage, mass, mass effect, or midline shift. No hydrocephalus or extra-axial fluid collection. Mildly advanced cerebral atrophy for age. Periventricular white matter changes, likely the sequela of chronic small vessel ischemic disease. Vascular: No hyperdense vessel. Skull: Negative for fracture or focal lesion. Sinuses/Orbits: Sequela of prior endoscopic sinus surgery. Mucosal thickening in the right maxillary sinus, with osseous thickening compatible with chronic right maxillary sinusitis. Mild mucosal thickening right sphenoid sinus and bilateral ethmoid air cells. Status post bilateral lens replacements. Other: The mastoid air cells are well aerated. IMPRESSION: No acute intracranial process. Electronically Signed   By: Wiliam Ke M.D.   On: 06/29/2023 14:57   CT HEAD WO CONTRAST ( )  Result Date: 06/03/2023 CLINICAL DATA:  Provided history: Syncope, unspecified syncope type. Syncope/presyncope,  cerebrovascular cause suspected. Additional history provided: recurrent syncope, multiple falls, worsening left-sided facial droop. EXAM: CT HEAD WITHOUT CONTRAST TECHNIQUE: Contiguous axial images were obtained from the base of the skull through the vertex without intravenous contrast. RADIATION DOSE REDUCTION: This exam was performed according to the departmental dose-optimization program which includes automated exposure control, adjustment of the mA and/or kV according to patient size and/or use of iterative reconstruction technique. COMPARISON:  Prior head CT examinations 03/17/2023 and earlier. FINDINGS: Brain: Generalized cerebral and cerebellar atrophy. Commensurate prominence of the ventricles and sulci. Patchy and ill-defined hypoattenuation within the cerebral white matter, nonspecific but compatible with moderate chronic small vessel ischemic disease. There is no acute intracranial hemorrhage. No demarcated cortical infarct. No extra-axial fluid collection. No evidence of an intracranial mass. No midline shift. Vascular: No hyperdense vessel.  Atherosclerotic calcifications. Skull: No calvarial fracture or aggressive osseous lesion. Sinuses/Orbits: No mass or acute finding within the imaged orbits. Post-surgical appearance of the paranasal sinuses. No significant inflammatory paranasal sinus disease at the imaged levels. IMPRESSION: 1. No evidence of an acute intracranial abnormality. 2. Parenchymal atrophy and chronic small vessel ischemic disease, as described. Electronically Signed   By: Ronaldo Miyamoto  Renette Butters D.O.   On: 06/03/2023 16:07    DISCHARGE EXAMINATION: Vitals:   07/01/23 1314 07/01/23 2202 07/02/23 0531 07/02/23 1321  BP: (!) 154/67 (!) 148/81 (!) 146/70 (!) 146/60  Pulse: 66 64 (!) 59 62  Resp: 17 17 15 14   Temp: 98.5 F (36.9 C) 98.5 F (36.9 C) 97.9 F (36.6 C) 98.3 F (36.8 C)  TempSrc: Oral Oral Oral Oral  SpO2: 100% 95% 97% 98%  Weight:      Height:       General appearance:  Awake alert.  In no distress Resp: Clear to auscultation bilaterally.  Normal effort Cardio: S1-S2 is normal regular.  No S3-S4.  No rubs murmurs or bruit GI: Abdomen is soft.  Nontender nondistended.  Bowel sounds are present normal.  No masses organomegaly    DISPOSITION: Home with family  Discharge Instructions     Call MD for:  difficulty breathing, headache or visual disturbances   Complete by: As directed    Call MD for:  extreme fatigue   Complete by: As directed    Call MD for:  hives   Complete by: As directed    Call MD for:  persistant dizziness or light-headedness   Complete by: As directed    Call MD for:  persistant nausea and vomiting   Complete by: As directed    Call MD for:  severe uncontrolled pain   Complete by: As directed    Call MD for:  temperature >100.4   Complete by: As directed    Diet - low sodium heart healthy   Complete by: As directed    Discharge instructions   Complete by: As directed    Please be sure to follow-up with your primary care provider.  Take your medications as prescribed.  You were cared for by a hospitalist during your hospital stay. If you have any questions about your discharge medications or the care you received while you were in the hospital after you are discharged, you can call the unit and asked to speak with the hospitalist on call if the hospitalist that took care of you is not available. Once you are discharged, your primary care physician will handle any further medical issues. Please note that NO REFILLS for any discharge medications will be authorized once you are discharged, as it is imperative that you return to your primary care physician (or establish a relationship with a primary care physician if you do not have one) for your aftercare needs so that they can reassess your need for medications and monitor your lab values. If you do not have a primary care physician, you can call [phone] for a physician referral.    Increase activity slowly   Complete by: As directed          Allergies as of 07/02/2023       Reactions   Codeine Sulfate    REACTION: hallucinations   Penicillins Anaphylaxis, Swelling, Rash   Tolerates Rocephin   Amoxapine And Related    Wellbutrin [bupropion] Other (See Comments)   Seizures and hallucinations        Medication List     TAKE these medications    Accu-Chek Guide test strip Generic drug: glucose blood USE UP TO 4 TIMES DAILY AS DIRECTED   Accu-Chek Softclix Lancets lancets USE UP TO 4 TIMES DAILY AS DIRECTED   acetaminophen 500 MG tablet Commonly known as: TYLENOL Take 500 mg by mouth every 6 (six) hours as needed for  moderate pain (pain score 4-6) or mild pain (pain score 1-3).   aspirin 81 MG tablet Take 81 mg by mouth daily.   blood glucose meter kit and supplies Kit Dispense based on patient and insurance preference. Use up to four times daily as directed. (FOR ICD-9 250.00, 250.01).   calcium carbonate 750 MG chewable tablet Commonly known as: TUMS EX Chew 1 tablet by mouth as needed.   carbidopa-levodopa 25-100 MG tablet Commonly known as: SINEMET IR TAKE 3 TABLETS BY MOUTH 3 TIMES DAILY. What changed: See the new instructions.   cetirizine 10 MG tablet Commonly known as: ZYRTEC Take 10 mg by mouth at bedtime.   clonazePAM 0.5 MG tablet Commonly known as: KLONOPIN TAKE 1/2 TABLET BY MOUTH AT BEDTIME AND 1/2 DAILY AS NEEDED What changed: See the new instructions.   donepezil 5 MG tablet Commonly known as: ARICEPT TAKE 1 TABLET BY MOUTH EVERYDAY AT BEDTIME What changed: See the new instructions.   escitalopram 10 MG tablet Commonly known as: LEXAPRO TAKE 1 TABLET BY MOUTH EVERY DAY What changed: when to take this   feeding supplement (ENSURE COMPLETE) Liqd Take 237 mLs by mouth 2 (two) times daily between meals. What changed: when to take this   fluticasone 50 MCG/ACT nasal spray Commonly known as: FLONASE Place 2  sprays into both nostrils daily as needed for allergies.   gabapentin 100 MG capsule Commonly known as: NEURONTIN TAKE 1 CAPSULE BY MOUTH THREE TIMES A DAY What changed: See the new instructions.   ibuprofen 200 MG tablet Commonly known as: ADVIL Take 200 mg by mouth every 6 (six) hours as needed for fever, headache, mild pain (pain score 1-3) or moderate pain (pain score 4-6).   memantine 10 MG tablet Commonly known as: NAMENDA Take 1 tablet (10 mg total) by mouth 2 (two) times daily.   mirtazapine 30 MG tablet Commonly known as: REMERON TAKE 1 TABLET (30 MG TOTAL) BY MOUTH AT BEDTIME. FOR SLEEP.   montelukast 10 MG tablet Commonly known as: SINGULAIR TAKE 1 TABLET (10 MG TOTAL) BY MOUTH AT BEDTIME. FOR ALLERGIES   naproxen sodium 220 MG tablet Commonly known as: ALEVE Take 220 mg by mouth daily as needed.   nitrofurantoin (macrocrystal-monohydrate) 100 MG capsule Commonly known as: MACROBID Take 1 capsule (100 mg total) by mouth every 12 (twelve) hours for 5 days.   rosuvastatin 5 MG tablet Commonly known as: CRESTOR TAKE 1 TABLET (5 MG TOTAL) BY MOUTH EVERY EVENING. FOR CHOLESTEROL.   Vitamin D3 25 MCG (1000 UT) Caps Take 1 capsule by mouth daily.           TOTAL DISCHARGE TIME: 35 minutes  Kamylah Manzo [employer] on [email or web address]  07/03/2023, 10:40 AM

## 2023-07-02 NOTE — Progress Notes (Signed)
Mobility Specialist - Progress Note   07/02/23 1030  Mobility  Activity Ambulated with assistance in hallway  Level of Assistance Contact guard assist, steadying assist  Assistive Device Front wheel walker  Distance Ambulated (ft) 80 ft  Activity Response Tolerated well  Mobility Referral Yes  Mobility visit 1 Mobility  Mobility Specialist Start Time (ACUTE ONLY) 1017  Mobility Specialist Stop Time (ACUTE ONLY) 1030  Mobility Specialist Time Calculation (min) (ACUTE ONLY) 13 min   Pt received in bed and agreeable to mobility. Pt had x2 LOB that were corrected with the gait belt. No complaints during session. Pt to recliner after session with all needs met.    [facility]

## 2023-07-04 ENCOUNTER — Telehealth: Payer: Self-pay

## 2023-07-04 NOTE — Transitions of Care (Post Inpatient/ED Visit) (Signed)
   07/04/2023  Name: [name] MRN: [id number] DOB: 18-Jan-1946  Today's TOC FU Call Status: Today's TOC FU Call Status:: Unsuccessful Call (1st Attempt) Unsuccessful Call (1st Attempt) Date: 07/04/23  Attempted to reach the patient regarding the most recent Inpatient/ED visit.  Follow Up Plan: Additional outreach attempts will be made to reach the patient to complete the Transitions of Care (Post Inpatient/ED visit) call.   Deidre Ala, RN Medical illustrator [facility] [phone]

## 2023-07-05 ENCOUNTER — Telehealth: Payer: Self-pay | Admitting: *Deleted

## 2023-07-05 NOTE — Transitions of Care (Post Inpatient/ED Visit) (Signed)
   07/05/2023  Name: [name] MRN: [id number] DOB: [birth year]  Today's TOC FU Call Status: Today's TOC FU Call Status:: Unsuccessful Call (2nd Attempt) Unsuccessful Call (2nd Attempt) Date: 07/05/23  Attempted to reach the patient regarding the most recent Inpatient/ED visit.  Follow Up Plan: Additional outreach attempts will be made to reach the patient to complete the Transitions of Care (Post Inpatient/ED visit) call.   Gean Maidens BSN RN Population Health- Transition of Care Team.  [facility] [phone]

## 2023-07-06 ENCOUNTER — Telehealth: Payer: Self-pay | Admitting: *Deleted

## 2023-07-06 NOTE — Transitions of Care (Post Inpatient/ED Visit) (Signed)
07/06/2023  Name: [name] MRN: [id number] DOB: [birth year]  Today's TOC FU Call Status: Today's TOC FU Call Status:: Successful TOC FU Call Completed TOC FU Call Complete Date: 07/06/23 Patient's Name and Date of Birth confirmed.  Transition Care Management Follow-up Telephone Call Date of Discharge: 07/05/23 Discharge Facility: [facility]) Type of Discharge: Inpatient Admission Primary Inpatient Discharge Diagnosis:: acute cystitis without hematuria How have you been since you were released from the hospital?: Better Any questions or concerns?: No  Items Reviewed: Did you receive and understand the discharge instructions provided?: Yes Medications obtained,verified, and reconciled?: Yes (Medications Reviewed) Any new allergies since your discharge?: No Dietary orders reviewed?: Yes Type of Diet Ordered:: low sodium heart healthy Do you have support at home?: Yes People in Home: child(ren), adult Name of Support/Comfort Primary Source: Adela Lank- daughter  Medications Reviewed Today: Medications Reviewed Today     Reviewed by Luella Cook, RN (Case Manager) on 07/06/23 at 1012  Med List Status: <None>   Medication Order Taking? Sig Documenting Provider Last Dose Status Informant  ACCU-CHEK GUIDE test strip [id number] Yes USE UP TO 4 TIMES DAILY AS DIRECTED Doreene Nest, NP Taking Active Child, Pharmacy Records  Accu-Chek Softclix Lancets lancets [id number] Yes USE UP TO 4 TIMES DAILY AS DIRECTED Doreene Nest, NP Taking Active Child, Pharmacy Records  acetaminophen (TYLENOL) 500 MG tablet [id number] Yes Take 500 mg by mouth every 6 (six) hours as needed for moderate pain (pain score 4-6) or mild pain (pain score 1-3). [provider] Taking Active Child, Pharmacy Records  aspirin 81 MG tablet [id number] Yes Take 81 mg by mouth daily. [provider] Taking Active Child, Pharmacy Records  blood glucose meter kit and supplies KIT  [id number] Yes Dispense based on patient and insurance preference. Use up to four times daily as directed. (FOR ICD-9 250.00, 250.01). Doreene Nest, NP Taking Active Child, Pharmacy Records  calcium carbonate (TUMS EX) 750 MG chewable tablet [id number] Yes Chew 1 tablet by mouth as needed.  [provider] Taking Active Child, Pharmacy Records  carbidopa-levodopa (SINEMET IR) 25-100 MG tablet [id number] Yes TAKE 3 TABLETS BY MOUTH 3 TIMES DAILY.  Patient taking differently: Take 3 tablets by mouth 3 (three) times daily.   Vladimir Faster, DO Taking Active Child, Pharmacy Records  cetirizine (ZYRTEC) 10 MG tablet [id number] Yes Take 10 mg by mouth at bedtime. [provider] Taking Active Child, Pharmacy Records  Cholecalciferol (VITAMIN D3) 1000 UNITS CAPS [id number] Yes Take 1 capsule by mouth daily. [provider] Taking Active Child, Pharmacy Records  clonazePAM (KLONOPIN) 0.5 MG tablet [id number] Yes TAKE 1/2 TABLET BY MOUTH AT BEDTIME AND 1/2 DAILY AS NEEDED  Patient taking differently: Take 0.25 mg by mouth See admin instructions. TAKE 1/2 TABLET BY MOUTH AT BEDTIME AND 1/2 DAILY AS NEEDED   Tat, Octaviano Batty, DO Taking Active Child, Pharmacy Records  donepezil (ARICEPT) 5 MG tablet [id number] Yes TAKE 1 TABLET BY MOUTH EVERYDAY AT BEDTIME  Patient taking differently: Take 5 mg by mouth at bedtime.   Vladimir Faster, DO Taking Active Child, Pharmacy Records  escitalopram (LEXAPRO) 10 MG tablet [id number]  TAKE 1 TABLET BY MOUTH EVERY DAY  Patient taking differently: Take 10 mg by mouth in the morning.   Vladimir Faster, DO  Active Child, Pharmacy Records  feeding supplement [facility] COMPLETE) LIQD [id number] Yes Take 237 mLs by mouth 2 (two) times daily between meals.  Patient taking differently: Take  237 mLs by mouth in the morning.   Alison Murray, MD Taking Active Child, Pharmacy Records  fluticasone [facility]) 50 MCG/ACT nasal spray [id number] Yes Place 2 sprays into both  nostrils daily as needed for allergies. [provider] Taking Active Child, Pharmacy Records  gabapentin (NEURONTIN) 100 MG capsule [id number] Yes TAKE 1 CAPSULE BY MOUTH THREE TIMES A DAY  Patient taking differently: Take 100 mg by mouth 3 (three) times daily.   Vladimir Faster, DO Taking Active Child, Pharmacy Records  ibuprofen (ADVIL) 200 MG tablet [id number] Yes Take 200 mg by mouth every 6 (six) hours as needed for fever, headache, mild pain (pain score 1-3) or moderate pain (pain score 4-6). [provider] Taking Active Child, Pharmacy Records  memantine [facility]) 10 MG tablet [id number] Yes Take 1 tablet (10 mg total) by mouth 2 (two) times daily. Vladimir Faster, DO Taking Active Child, Pharmacy Records  mirtazapine (REMERON) 30 MG tablet [id number] Yes TAKE 1 TABLET (30 MG TOTAL) BY MOUTH AT BEDTIME. FOR SLEEP. Doreene Nest, NP Taking Active Child, Pharmacy Records  montelukast (SINGULAIR) 10 MG tablet [id number] Yes TAKE 1 TABLET (10 MG TOTAL) BY MOUTH AT BEDTIME. FOR ALLERGIES Doreene Nest, NP Taking Active Child, Pharmacy Records  naproxen sodium (ALEVE) 220 MG tablet [id number] Yes Take 220 mg by mouth daily as needed. [provider] Taking Active Child, Pharmacy Records  nitrofurantoin, macrocrystal-monohydrate, (MACROBID) 100 MG capsule [id number] Yes Take 1 capsule (100 mg total) by mouth every 12 (twelve) hours for 5 days. Osvaldo Shipper, MD Taking Active   rosuvastatin (CRESTOR) 5 MG tablet [id number] Yes TAKE 1 TABLET (5 MG TOTAL) BY MOUTH EVERY EVENING. FOR CHOLESTEROL. Doreene Nest, NP Taking Active Child, Pharmacy Records    Discontinued 01/02/20 1710             Home Care and Equipment/Supplies: Were Home Health Services Ordered?: NA Any new equipment or medical supplies ordered?: NA  Functional Questionnaire: Do you need assistance with bathing/showering or dressing?: Yes Do you need assistance with meal preparation?: Yes Do  you need assistance with eating?: No Do you have difficulty maintaining continence: No Do you need assistance with getting out of bed/getting out of a chair/moving?: No Do you have difficulty managing or taking your medications?: No  Follow up appointments reviewed: PCP Follow-up appointment confirmed?: Yes Date of PCP follow-up appointment?: 07/08/23 Follow-up Provider: [facility] Follow-up appointment confirmed?: NA Do you need transportation to your follow-up appointment?: No Do you understand care options if your condition(s) worsen?: Yes-patient verbalized understanding  SDOH Interventions Today    Flowsheet Row Most Recent Value  SDOH Interventions   Food Insecurity Interventions Intervention Not Indicated  Housing Interventions Intervention Not Indicated  Transportation Interventions Intervention Not Indicated, Patient Resources (Friends/Family)  Utilities Interventions Intervention Not Indicated     Daughter declined any further follow up outreach calls.  RN discussed with daughter how to initiate the [facility] meal plan TOC interventions discussed/reviewed: -Discussed/reviewed insurance/health plans benefits -Doctor visit discussed/reviewed -PCP -Doctor visits discussed/reviewed-Specialist -Provided Verbal Education: 30-day TOC program, nutrition, meds & their functions, symptom mgmt., fall/safety measures in the home  Gean Maidens BSN RN Population Health- Transition of Care Team.  [facility] [phone]

## 2023-07-08 ENCOUNTER — Ambulatory Visit (INDEPENDENT_AMBULATORY_CARE_PROVIDER_SITE_OTHER): Payer: Medicare Other | Admitting: Family Medicine

## 2023-07-08 VITALS — BP 124/76 | HR 66 | Temp 97.3°F | Ht 62.0 in | Wt 143.0 lb

## 2023-07-08 DIAGNOSIS — E876 Hypokalemia: Secondary | ICD-10-CM

## 2023-07-08 DIAGNOSIS — F0390 Unspecified dementia without behavioral disturbance: Secondary | ICD-10-CM | POA: Diagnosis not present

## 2023-07-08 DIAGNOSIS — N3001 Acute cystitis with hematuria: Secondary | ICD-10-CM

## 2023-07-08 DIAGNOSIS — K5900 Constipation, unspecified: Secondary | ICD-10-CM

## 2023-07-08 DIAGNOSIS — Z87440 Personal history of urinary (tract) infections: Secondary | ICD-10-CM | POA: Diagnosis not present

## 2023-07-08 DIAGNOSIS — G9341 Metabolic encephalopathy: Secondary | ICD-10-CM | POA: Diagnosis not present

## 2023-07-08 LAB — BASIC METABOLIC PANEL
BUN: 15 mg/dL (ref 6–23)
CO2: 31 meq/L (ref 19–32)
Calcium: 9.6 mg/dL (ref 8.4–10.5)
Chloride: 100 meq/L (ref 96–112)
Creatinine, Ser: 0.66 mg/dL (ref 0.40–1.20)
GFR: 83.95 mL/min (ref >60.00)
Glucose, Bld: 153 mg/dL — ABNORMAL HIGH (ref 70–99)
Potassium: 4.5 meq/L (ref 3.5–5.1)
Sodium: 140 meq/L (ref 135–145)

## 2023-07-08 NOTE — Progress Notes (Signed)
Patient ID: [name], female    DOB: 06-Dec-1945, 77 y.o.   MRN: [id number]  This visit was conducted in person.  BP 124/76   Pulse 66   Temp (!) 97.3 F (36.3 C) (Temporal)   Ht 5\' 2"  (1.575 m)   Wt 143 lb (64.9 kg)   SpO2 94%   BMI 26.16 kg/m    CC:  Chief Complaint  Patient presents with   Hospitalization Follow-up    Subjective:   HPI: [name] is a 77 y.o. female patient of [name] presenting on 07/08/2023 for Hospitalization Follow-up  Reviewed discharge summary Admitted June 29, 2023 Discharged July 02, 2023  Primary admission diagnosis acute cystitis in setting of dementia, Parkinson's with resulting mental status changes/disorientation, weakness, unsteady gait and decreased p.o. intake  Urinalysis was noted to be grossly abnormal but patient denied dysuria.  Treated with ceftriaxone.  Urine culture showed  E. Coli, resistant to ampicillin,cefazolin and Bactrim Discharged on Macrobid  x 5 days after noting significant improvement in house.  Disorientation and confusion and baseline dementia and Parkinson's disease felt secondary to UTI.  CT scan was without acute findings  Today she presents with [name], her daughter She reports that patient continues to be confused.  3 days of LLQ pain. Has had significant incontinence.. using pad... worse than baseline issus  since 12/4.  Odor of urine has resolved  She denies not dysuria.  No fever.  Has follow up with Dr. Arbutus Leas in 07/2022       Relevant past medical, surgical, family and social history reviewed and updated as indicated. Interim medical history since our last visit reviewed. Allergies and medications reviewed and updated. Outpatient Medications Prior to Visit  Medication Sig Dispense Refill   ACCU-CHEK GUIDE test strip USE UP TO 4 TIMES DAILY AS DIRECTED 300 strip 1   Accu-Chek Softclix Lancets lancets USE UP TO 4 TIMES DAILY AS DIRECTED 200 each 1   acetaminophen (TYLENOL) 500 MG  tablet Take 500 mg by mouth every 6 (six) hours as needed for moderate pain (pain score 4-6) or mild pain (pain score 1-3).     aspirin 81 MG tablet Take 81 mg by mouth daily.     blood glucose meter kit and supplies KIT Dispense based on patient and insurance preference. Use up to four times daily as directed. (FOR ICD-9 250.00, 250.01). 1 each 0   calcium carbonate (TUMS EX) 750 MG chewable tablet Chew 1 tablet by mouth as needed.      carbidopa-levodopa (SINEMET IR) 25-100 MG tablet TAKE 3 TABLETS BY MOUTH 3 TIMES DAILY. (Patient taking differently: Take 3 tablets by mouth 3 (three) times daily.) 810 tablet 0   cetirizine (ZYRTEC) 10 MG tablet Take 10 mg by mouth at bedtime.     Cholecalciferol (VITAMIN D3) 1000 UNITS CAPS Take 1 capsule by mouth daily.     clonazePAM (KLONOPIN) 0.5 MG tablet TAKE 1/2 TABLET BY MOUTH AT BEDTIME AND 1/2 DAILY AS NEEDED (Patient taking differently: Take 0.25 mg by mouth See admin instructions. TAKE 1/2 TABLET BY MOUTH AT BEDTIME AND 1/2 DAILY AS NEEDED) 90 tablet 0   donepezil (ARICEPT) 5 MG tablet TAKE 1 TABLET BY MOUTH EVERYDAY AT BEDTIME (Patient taking differently: Take 5 mg by mouth at bedtime.) 90 tablet 0   escitalopram (LEXAPRO) 10 MG tablet TAKE 1 TABLET BY MOUTH EVERY DAY (Patient taking differently: Take 10 mg by mouth in the morning.) 90 tablet 0  feeding supplement (ENSURE COMPLETE) LIQD Take 237 mLs by mouth 2 (two) times daily between meals. (Patient taking differently: Take 237 mLs by mouth in the morning.) 1 Bottle 11   fluticasone (FLONASE) 50 MCG/ACT nasal spray Place 2 sprays into both nostrils daily as needed for allergies.     gabapentin (NEURONTIN) 100 MG capsule TAKE 1 CAPSULE BY MOUTH THREE TIMES A DAY (Patient taking differently: Take 100 mg by mouth 3 (three) times daily.) 270 capsule 0   ibuprofen (ADVIL) 200 MG tablet Take 200 mg by mouth every 6 (six) hours as needed for fever, headache, mild pain (pain score 1-3) or moderate pain (pain  score 4-6).     memantine (NAMENDA) 10 MG tablet Take 1 tablet (10 mg total) by mouth 2 (two) times daily. 180 tablet 1   mirtazapine (REMERON) 30 MG tablet TAKE 1 TABLET (30 MG TOTAL) BY MOUTH AT BEDTIME. FOR SLEEP. 90 tablet 2   montelukast (SINGULAIR) 10 MG tablet TAKE 1 TABLET (10 MG TOTAL) BY MOUTH AT BEDTIME. FOR ALLERGIES 90 tablet 3   naproxen sodium (ALEVE) 220 MG tablet Take 220 mg by mouth daily as needed.     rosuvastatin (CRESTOR) 5 MG tablet TAKE 1 TABLET (5 MG TOTAL) BY MOUTH EVERY EVENING. FOR CHOLESTEROL. 90 tablet 3   No facility-administered medications prior to visit.     Per HPI unless specifically indicated in ROS section below Review of Systems  Constitutional:  Negative for fatigue and fever.  HENT:  Negative for congestion.   Eyes:  Negative for pain.  Respiratory:  Negative for cough and shortness of breath.   Cardiovascular:  Negative for chest pain, palpitations and leg swelling.  Gastrointestinal:  Negative for abdominal pain.  Genitourinary:  Negative for dysuria and vaginal bleeding.  Musculoskeletal:  Negative for back pain.  Neurological:  Negative for syncope, light-headedness and headaches.  Psychiatric/Behavioral:  Positive for confusion. Negative for dysphoric mood.    Objective:  BP 124/76   Pulse 66   Temp (!) 97.3 F (36.3 C) (Temporal)   Ht 5\' 2"  (1.575 m)   Wt 143 lb (64.9 kg)   SpO2 94%   BMI 26.16 kg/m   Wt Readings from Last 3 Encounters:  07/08/23 143 lb (64.9 kg)  06/30/23 148 lb 13 oz (67.5 kg)  06/10/23 148 lb (67.1 kg)      Physical Exam Constitutional:      General: She is not in acute distress.    Appearance: Normal appearance. She is well-developed. She is not ill-appearing or toxic-appearing.  HENT:     Head: Normocephalic.     Right Ear: Hearing, tympanic membrane, ear canal and external ear normal. Tympanic membrane is not erythematous, retracted or bulging.     Left Ear: Hearing, tympanic membrane, ear canal and  external ear normal. Tympanic membrane is not erythematous, retracted or bulging.     Nose: No mucosal edema or rhinorrhea.     Right Sinus: No maxillary sinus tenderness or frontal sinus tenderness.     Left Sinus: No maxillary sinus tenderness or frontal sinus tenderness.     Mouth/Throat:     Pharynx: Uvula midline.  Eyes:     General: Lids are normal. Lids are everted, no foreign bodies appreciated.     Conjunctiva/sclera: Conjunctivae normal.     Pupils: Pupils are equal, round, and reactive to light.  Neck:     Thyroid: No thyroid mass or thyromegaly.     Vascular: No carotid bruit.  Trachea: Trachea normal.  Cardiovascular:     Rate and Rhythm: Normal rate and regular rhythm.     Pulses: Normal pulses.     Heart sounds: Normal heart sounds, S1 normal and S2 normal. No murmur heard.    No friction rub. No gallop.  Pulmonary:     Effort: Pulmonary effort is normal. No tachypnea or respiratory distress.     Breath sounds: Normal breath sounds. No decreased breath sounds, wheezing, rhonchi or rales.  Abdominal:     General: Bowel sounds are normal.     Palpations: Abdomen is soft.     Tenderness: There is no abdominal tenderness.  Musculoskeletal:     Cervical back: Normal range of motion and neck supple.  Skin:    General: Skin is warm and dry.     Findings: No rash.  Neurological:     Mental Status: She is alert. She is confused.     Cranial Nerves: Cranial nerves 2-12 are intact.  Psychiatric:        Mood and Affect: Mood is not anxious or depressed.        Speech: Speech normal.        Behavior: Behavior normal. Behavior is cooperative.        Thought Content: Thought content normal.        Judgment: Judgment normal.       Results for orders placed or performed in visit on 07/08/23  Basic Metabolic Panel   Collection Time: 07/08/23 12:39 PM  Result Value Ref Range   Sodium 140 135 - 145 mEq/L   Potassium 4.5 3.5 - 5.1 mEq/L   Chloride 100 96 - 112 mEq/L    CO2 31 19 - 32 mEq/L   Glucose, Bld 153 (H) 70 - 99 mg/dL   BUN 15 6 - 23 mg/dL   Creatinine, Ser 8.11 0.40 - 1.20 mg/dL   GFR 91.47 >82.95 mL/min   Calcium 9.6 8.4 - 10.5 mg/dL    Assessment and Plan  Acute cystitis with hematuria Assessment & Plan: Resolved   Dementia without behavioral disturbance (HCC) Assessment & Plan: Chronic, likely progressing     Acute metabolic encephalopathy Assessment & Plan: Acute confusion on top of baseline dementia.  Some improvement in confusion but continues to have altered mental status below baseline. No clear secondary cause other than possible symptom of constipation versus electrolyte imbalance.  May be new baseline for dementia.     Hypokalemia Assessment & Plan: Due for reevaluation  Orders: -     Basic metabolic panel  Constipation, unspecified constipation type Assessment & Plan: Increase water intake as able. Can start Miralax 17 gm daily for constipation and LLQ pain.     Return in about 3 weeks (around 07/29/2023) for  follow up with PCP for frequent UTIs.   Kerby Nora, MD

## 2023-07-08 NOTE — Patient Instructions (Addendum)
Please stop at the lab to have labs drawn.  Increase water intake as able. Can start Miralax 17 gm daily for constipation and LLQ pain.

## 2023-07-14 DIAGNOSIS — R55 Syncope and collapse: Secondary | ICD-10-CM | POA: Diagnosis not present

## 2023-07-14 DIAGNOSIS — R29810 Facial weakness: Secondary | ICD-10-CM | POA: Diagnosis not present

## 2023-07-19 DIAGNOSIS — G9341 Metabolic encephalopathy: Secondary | ICD-10-CM

## 2023-07-19 DIAGNOSIS — E876 Hypokalemia: Secondary | ICD-10-CM

## 2023-07-19 DIAGNOSIS — K5900 Constipation, unspecified: Secondary | ICD-10-CM | POA: Insufficient documentation

## 2023-07-19 HISTORY — DX: Hypokalemia: E87.6

## 2023-07-19 HISTORY — DX: Constipation, unspecified: K59.00

## 2023-07-19 HISTORY — DX: Metabolic encephalopathy: G93.41

## 2023-07-19 NOTE — Assessment & Plan Note (Signed)
Resolved

## 2023-07-19 NOTE — Assessment & Plan Note (Signed)
Due for reevaluation 

## 2023-07-19 NOTE — Assessment & Plan Note (Addendum)
Chronic, likely progressing

## 2023-07-19 NOTE — Assessment & Plan Note (Signed)
Increase water intake as able. Can start Miralax 17 gm daily for constipation and LLQ pain.

## 2023-07-19 NOTE — Assessment & Plan Note (Addendum)
Acute confusion on top of baseline dementia.  Some improvement in confusion but continues to have altered mental status below baseline. No clear secondary cause other than possible symptom of constipation versus electrolyte imbalance.  May be new baseline for dementia.

## 2023-08-08 ENCOUNTER — Emergency Department (HOSPITAL_COMMUNITY): Payer: Medicare Other

## 2023-08-08 ENCOUNTER — Emergency Department (HOSPITAL_COMMUNITY)
Admission: EM | Admit: 2023-08-08 | Discharge: 2023-08-08 | Disposition: A | Discharge status: Home / Self Care | Payer: Medicare Other | Attending: Emergency Medicine | Admitting: Emergency Medicine

## 2023-08-08 ENCOUNTER — Encounter (HOSPITAL_COMMUNITY): Payer: Self-pay

## 2023-08-08 DIAGNOSIS — F0390 Unspecified dementia without behavioral disturbance: Secondary | ICD-10-CM | POA: Insufficient documentation

## 2023-08-08 DIAGNOSIS — I499 Cardiac arrhythmia, unspecified: Secondary | ICD-10-CM | POA: Diagnosis not present

## 2023-08-08 DIAGNOSIS — E119 Type 2 diabetes mellitus without complications: Secondary | ICD-10-CM | POA: Diagnosis not present

## 2023-08-08 DIAGNOSIS — G20A1 Parkinson's disease without dyskinesia, without mention of fluctuations: Secondary | ICD-10-CM | POA: Insufficient documentation

## 2023-08-08 DIAGNOSIS — I10 Essential (primary) hypertension: Secondary | ICD-10-CM | POA: Insufficient documentation

## 2023-08-08 DIAGNOSIS — Z7982 Long term (current) use of aspirin: Secondary | ICD-10-CM | POA: Insufficient documentation

## 2023-08-08 DIAGNOSIS — R569 Unspecified convulsions: Secondary | ICD-10-CM | POA: Diagnosis not present

## 2023-08-08 DIAGNOSIS — E876 Hypokalemia: Secondary | ICD-10-CM | POA: Insufficient documentation

## 2023-08-08 DIAGNOSIS — R6889 Other general symptoms and signs: Secondary | ICD-10-CM | POA: Diagnosis not present

## 2023-08-08 DIAGNOSIS — Z743 Need for continuous supervision: Secondary | ICD-10-CM | POA: Diagnosis not present

## 2023-08-08 LAB — URINALYSIS, W/ REFLEX TO CULTURE (INFECTION SUSPECTED)
Bilirubin Urine: NEGATIVE
Glucose, UA: NEGATIVE mg/dL
Hgb urine dipstick: NEGATIVE
Ketones, ur: 5 mg/dL — AB
Nitrite: NEGATIVE
Protein, ur: NEGATIVE mg/dL
Specific Gravity, Urine: 1.019 (ref 1.005–1.030)
pH: 5 (ref 5.0–8.0)

## 2023-08-08 LAB — COMPREHENSIVE METABOLIC PANEL
ALT: 6 U/L (ref 0–44)
AST: 10 U/L — ABNORMAL LOW (ref 15–41)
Albumin: 3 g/dL — ABNORMAL LOW (ref 3.5–5.0)
Alkaline Phosphatase: 65 U/L (ref 38–126)
Anion gap: 11 (ref 5–15)
BUN: 12 mg/dL (ref 8–23)
CO2: 29 mmol/L (ref 22–32)
Calcium: 9.3 mg/dL (ref 8.9–10.3)
Chloride: 99 mmol/L (ref 98–111)
Creatinine, Ser: 0.71 mg/dL (ref 0.44–1.00)
GFR, Estimated: >60 mL/min (ref >60)
Glucose, Bld: 152 mg/dL — ABNORMAL HIGH (ref 70–99)
Potassium: 3.4 mmol/L — ABNORMAL LOW (ref 3.5–5.1)
Sodium: 139 mmol/L (ref 135–145)
Total Bilirubin: 1 mg/dL (ref 0.0–1.2)
Total Protein: 5.7 g/dL — ABNORMAL LOW (ref 6.5–8.1)

## 2023-08-08 LAB — CBC WITH DIFFERENTIAL/PLATELET
Abs Immature Granulocytes: 0.05 10*3/uL (ref 0.00–0.07)
Basophils Absolute: 0 10*3/uL (ref 0.0–0.1)
Basophils Relative: 1 %
Eosinophils Absolute: 0.1 10*3/uL (ref 0.0–0.5)
Eosinophils Relative: 1 %
HCT: 39.2 % (ref 36.0–46.0)
Hemoglobin: 12.5 g/dL (ref 12.0–15.0)
Immature Granulocytes: 1 %
Lymphocytes Relative: 8 %
Lymphs Abs: 0.7 10*3/uL (ref 0.7–4.0)
MCH: 29 pg (ref 26.0–34.0)
MCHC: 31.9 g/dL (ref 30.0–36.0)
MCV: 91 fL (ref 80.0–100.0)
Monocytes Absolute: 0.4 10*3/uL (ref 0.1–1.0)
Monocytes Relative: 5 %
Neutro Abs: 7.4 10*3/uL (ref 1.7–7.7)
Neutrophils Relative %: 84 %
Platelets: 210 10*3/uL (ref 150–400)
RBC: 4.31 MIL/uL (ref 3.87–5.11)
RDW: 13.6 % (ref 11.5–15.5)
WBC: 8.6 10*3/uL (ref 4.0–10.5)
nRBC: 0 % (ref 0.0–0.2)

## 2023-08-08 LAB — CBG MONITORING, ED: Glucose-Capillary: 140 mg/dL — ABNORMAL HIGH (ref 70–99)

## 2023-08-08 LAB — RAPID URINE DRUG SCREEN, HOSP PERFORMED
Amphetamines: NOT DETECTED
Barbiturates: NOT DETECTED
Benzodiazepines: NOT DETECTED
Cocaine: NOT DETECTED
Opiates: NOT DETECTED
Tetrahydrocannabinol: NOT DETECTED

## 2023-08-08 LAB — ETHANOL: Alcohol, Ethyl (B): <10 mg/dL (ref <10)

## 2023-08-08 LAB — MAGNESIUM: Magnesium: 1.9 mg/dL (ref 1.7–2.4)

## 2023-08-08 MED ORDER — POTASSIUM CHLORIDE CRYS ER 20 MEQ PO TBCR
20.0000 meq | EXTENDED_RELEASE_TABLET | Freq: Once | ORAL | Status: AC
  Administered 2023-08-08: 20 meq via ORAL
  Filled 2023-08-08: qty 1

## 2023-08-08 NOTE — ED Provider Notes (Signed)
 [place] EMERGENCY DEPARTMENT AT [facility] Provider Note  CSN: [id number] Arrival date & time: 08/08/23 1025  Chief Complaint(s) Seizures  HPI [name] is a 78 y.o. female here today for seizure.  Patient has a history of dementia, is ANO x 1 at baseline.  Family reports that the patient had a episode of shaking.  They do not report a fall.   Past Medical History 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    can't do either   DVT (deep venous thrombosis), left early 2000's   RLE   Dysmetabolic syndrome X    Essential hypertension, benign    Headache(784.0) 05/2011   pretty often since they put me on Parkinson's medicine   History of shingles    as a teen and in my 30's   Mixed hyperlipidemia    Morbid obesity (HCC)    Neurodegenerative gait disorder    Personal history of thrombophlebitis    Syncope and collapse 12/06/2011   this am; I pass out fairly often   Type II or unspecified type diabetes mellitus without mention of complication, not stated as uncontrolled 12/06/2011   not anymore; had lap band OR   Urinary urgency 01/15/2022   Patient Active Problem List   Diagnosis Date Noted   Acute metabolic encephalopathy 07/19/2023   Hypokalemia 07/19/2023   Constipation 07/19/2023   UTI (urinary tract infection) 06/29/2023   Dementia without behavioral disturbance (HCC) 06/29/2023   History of Parkinson disease 06/29/2023   Cerumen impaction 04/26/2023   Foul smelling urine 02/08/2023   Acute facial pain 07/05/2022   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   Preventative health care 12/12/2017   Cramping of hands 03/23/2017   Osteoporosis 01/31/2017   GERD (gastroesophageal reflux disease) 08/26/2015    Recurrent UTI 05/27/2015   Type 2 diabetes mellitus with hyperglycemia (HCC) 12/03/2014   DNR (do not resuscitate) 11/23/2012   Dysphagia 12/07/2011   Orthostasis 12/07/2011   Bell's palsy    Syncope 12/06/2011   Hypersomnia 01/18/2011   ANXIETY DEPRESSION 04/16/2010   OBESITY, MORBID 02/16/2007   HYPERLIPIDEMIA, MIXED 01/31/2007   HYPERTENSION, BENIGN ESSENTIAL 01/31/2007   Home Medication(s) Prior to Admission medications   Medication Sig Start Date End Date Taking? Authorizing Provider  ACCU-CHEK GUIDE test strip USE UP TO 4 TIMES DAILY AS DIRECTED 11/14/22   Clark, Katherine K, NP  Accu-Chek Softclix Lancets lancets USE UP TO 4 TIMES DAILY AS DIRECTED 07/25/22   Clark, Katherine K, NP  acetaminophen  (TYLENOL ) 500 MG tablet Take 500 mg by mouth every 6 (six) hours as needed for moderate pain (pain score 4-6) or mild pain (pain score 1-3).    [provider]  aspirin  81 MG tablet Take 81 mg by mouth daily.    [provider]  blood glucose meter kit and supplies KIT Dispense based on patient and insurance preference. Use up to four times daily as directed. (FOR ICD-9 250.00, 250.01). 04/15/22   Gretta Comer POUR, NP  calcium  carbonate (TUMS EX) 750 MG chewable tablet Chew 1 tablet by mouth as needed.     [provider]  carbidopa -levodopa  (SINEMET  IR) 25-100 MG tablet TAKE 3 TABLETS BY MOUTH 3 TIMES DAILY. Patient taking  differently: Take 3 tablets by mouth 3 (three) times daily. 05/24/23   Tat, Asberry RAMAN, DO  cetirizine (ZYRTEC) 10 MG tablet Take 10 mg by mouth at bedtime.    [provider]  Cholecalciferol  (VITAMIN D3) 1000 UNITS CAPS Take 1 capsule by mouth daily.    [provider]  clonazePAM  (KLONOPIN ) 0.5 MG tablet TAKE 1/2 TABLET BY MOUTH AT BEDTIME AND 1/2 DAILY AS NEEDED Patient taking differently: Take 0.25 mg by mouth See admin instructions. TAKE 1/2 TABLET BY MOUTH AT BEDTIME AND 1/2 DAILY AS NEEDED 06/20/23   Tat, Asberry RAMAN, DO   donepezil  (ARICEPT ) 5 MG tablet TAKE 1 TABLET BY MOUTH EVERYDAY AT BEDTIME Patient taking differently: Take 5 mg by mouth at bedtime. 06/20/23   Tat, Asberry RAMAN, DO  escitalopram  (LEXAPRO ) 10 MG tablet TAKE 1 TABLET BY MOUTH EVERY DAY Patient taking differently: Take 10 mg by mouth in the morning. 06/20/23   Tat, Asberry RAMAN, DO  feeding supplement (ENSURE COMPLETE) LIQD Take 237 mLs by mouth 2 (two) times daily between meals. Patient taking differently: Take 237 mLs by mouth in the morning. 04/01/12   Levern Beckey HERO, MD  fluticasone  (FLONASE ) 50 MCG/ACT nasal spray Place 2 sprays into both nostrils daily as needed for allergies. 10/02/20   [provider]  gabapentin  (NEURONTIN ) 100 MG capsule TAKE 1 CAPSULE BY MOUTH THREE TIMES A DAY Patient taking differently: Take 100 mg by mouth 3 (three) times daily. 06/20/23   Tat, Asberry RAMAN, DO  ibuprofen  (ADVIL ) 200 MG tablet Take 200 mg by mouth every 6 (six) hours as needed for fever, headache, mild pain (pain score 1-3) or moderate pain (pain score 4-6).    [provider]  memantine  (NAMENDA ) 10 MG tablet Take 1 tablet (10 mg total) by mouth 2 (two) times daily. 01/07/23   Tat, Asberry RAMAN, DO  mirtazapine  (REMERON ) 30 MG tablet TAKE 1 TABLET (30 MG TOTAL) BY MOUTH AT BEDTIME. FOR SLEEP. 06/20/23   Clark, Katherine K, NP  montelukast  (SINGULAIR ) 10 MG tablet TAKE 1 TABLET (10 MG TOTAL) BY MOUTH AT BEDTIME. FOR ALLERGIES 05/01/23   Clark, Katherine K, NP  naproxen sodium (ALEVE) 220 MG tablet Take 220 mg by mouth daily as needed.    [provider]  rosuvastatin  (CRESTOR ) 5 MG tablet TAKE 1 TABLET (5 MG TOTAL) BY MOUTH EVERY EVENING. FOR CHOLESTEROL. 05/01/23   Gretta Comer POUR, NP  simvastatin  (ZOCOR ) 20 MG tablet TAKE 1 TABLET BY MOUTH EVERY DAY AT BEDTIME FOR CHOLESTEROL 10/01/19 01/02/20  Gretta Comer POUR, NP                                                                                                                                     Past Surgical History Past Surgical History:  Procedure Laterality Date   bladder tack  11/1995   BREAST BIOPSY  05/20/2000   bilaterally  BREAST CYST ASPIRATION  12/1998   bilaterally   CARDIAC CATHETERIZATION  2004   CATARACT EXTRACTION W/ INTRAOCULAR LENS  IMPLANT, BILATERAL     FRACTURE SURGERY  02/09/2006   bilateral elbows   FRACTURE SURGERY     right knee   FRACTURE SURGERY     tib plateau   KNEE ARTHROSCOPY  12/12/2002   right    LAPAROSCOPIC GASTRIC BANDING  2008   SEPTOPLASTY  08/1998   with antral window    TONSILLECTOMY AND ADENOIDECTOMY  1970   TUBAL LIGATION  1970's   Family History Family History  Problem Relation Age of Onset   Asthma Mother    COPD Father    Heart disease Father    Diabetes Sister    Vision loss Sister        legally blind   Hypertension Child    Hyperlipidemia Child    Diabetes Child    Diabetes Son    Breast cancer Neg Hx     Social History Social History   Tobacco Use   Smoking status: Never   Smokeless tobacco: Never  Vaping Use   Vaping status: Never Used  Substance Use Topics   Alcohol  use: No    Comment: none   Drug use: No   Allergies Codeine sulfate, Penicillins, Amoxapine and related, and Wellbutrin  [bupropion ]  Review of Systems Review of Systems  Physical Exam Vital Signs  I have reviewed the triage vital signs BP (!) 156/65   Pulse 60   Temp (!) 97.5 F (36.4 C) (Oral)   Resp 15   Ht 5' 2 (1.575 m)   Wt 64.9 kg   SpO2 98%   BMI 26.17 kg/m   Physical Exam Vitals and nursing note reviewed.  HENT:     Head: Normocephalic and atraumatic.  Eyes:     Pupils: Pupils are equal, round, and reactive to light.  Cardiovascular:     Rate and Rhythm: Normal rate.  Pulmonary:     Effort: Pulmonary effort is normal.  Abdominal:     General: Abdomen is flat.     Palpations: Abdomen is soft.  Musculoskeletal:        General: Normal range of motion.  Neurological:     Mental Status: She is  alert. Mental status is at baseline.     ED Results and Treatments Labs (all labs ordered are listed, but only abnormal results are displayed) Labs Reviewed  COMPREHENSIVE METABOLIC PANEL - Abnormal; Notable for the following components:      Result Value   Potassium 3.4 (*)    Glucose, Bld 152 (*)    Total Protein 5.7 (*)    Albumin 3.0 (*)    AST 10 (*)    All other components within normal limits  CBG MONITORING, ED - Abnormal; Notable for the following components:   Glucose-Capillary 140 (*)    All other components within normal limits  CBC WITH DIFFERENTIAL/PLATELET  ETHANOL  RAPID URINE DRUG SCREEN, HOSP PERFORMED  MAGNESIUM   Radiology CT Head Wo Contrast Result Date: 08/08/2023 CLINICAL DATA:  Seizure disorder, clinical change EXAM: CT HEAD WITHOUT CONTRAST TECHNIQUE: Contiguous axial images were obtained from the base of the skull through the vertex without intravenous contrast. RADIATION DOSE REDUCTION: This exam was performed according to the departmental dose-optimization program which includes automated exposure control, adjustment of the mA and/or kV according to patient size and/or use of iterative reconstruction technique. COMPARISON:  CT head 06/29/2023. FINDINGS: Brain: No evidence of acute infarction, hemorrhage, hydrocephalus, extra-axial collection or mass lesion/mass effect. Patchy white matter hypodensities are nonspecific but compatible with microvascular ischemic change. Similar cerebral atrophy. Vascular: No hyperdense vessel identified. Calcific atherosclerosis. Skull: No acute fracture. Sinuses/Orbits: Clear sinuses.  No acute orbital findings. Other: New mastoid effusions. IMPRESSION: Stable head CT.  No evidence of acute intracranial abnormality. Electronically Signed   By: Gilmore GORMAN Molt M.D.   On: 08/08/2023 13:14    Pertinent labs &  imaging results that were available during my care of the patient were reviewed by me and considered in my medical decision making (see MDM for details).  Medications Ordered in ED Medications  potassium chloride  SA (KLOR-CON  M) CR tablet 20 mEq (20 mEq Oral Given 08/08/23 1232)                                                                                                                                     Procedures Procedures  (including critical care time)  Medical Decision Making / ED Course   This patient presents to the ED for concern of possible seizure, this involves an extensive number of treatment options, and is a complaint that carries with it a high risk of complications and morbidity.  The differential diagnosis includes seizure disorder, electrolyte abnormalities, ICH, underlying infection.  MDM: Patient is alert and oriented x 1 for me, reportedly is her baseline.  Due to report provided by EMS, patient seem to have a witnessed tonic-clonic seizure, family is able to catch her and safely lowered her to the ground.  Patient's blood sugar was 171 per EMS.  Patient with a prior history of seizure disorder, not currently on medications as she had not had a seizure for 10 years.  Will obtain imaging of the patient's head, basic labs ordered.   Reassessment 3 PM-my dependent review the patient's head CT shows no cranial hemorrhage.  Patient's labs did show mild hypokalemia, have repleted.  Patient is at her baseline.  Spoke with patient's daughter Lonell.  She has a neurologist, they will follow-up with neurologist on an outpatient basis.  Additional history obtained: -Additional history obtained from daughter -External records from outside source obtained and reviewed including: Chart review including previous notes, labs, imaging, consultation notes   Lab Tests: -I ordered, reviewed, and interpreted labs.   The pertinent results include:   Labs Reviewed  COMPREHENSIVE  METABOLIC PANEL - Abnormal; Notable for the following components:  Result Value   Potassium 3.4 (*)    Glucose, Bld 152 (*)    Total Protein 5.7 (*)    Albumin 3.0 (*)    AST 10 (*)    All other components within normal limits  CBG MONITORING, ED - Abnormal; Notable for the following components:   Glucose-Capillary 140 (*)    All other components within normal limits  CBC WITH DIFFERENTIAL/PLATELET  ETHANOL  RAPID URINE DRUG SCREEN, HOSP PERFORMED  MAGNESIUM       EKG machine read of atrial fibrillation, however sinus rhythm.  EKG Interpretation Date/Time:  Monday August 08 2023 10:35:00 EST Ventricular Rate:  64 PR Interval:    QRS Duration:  101 QT Interval:  481 QTC Calculation: 497 R Axis:   -65  Text Interpretation: Atrial fibrillation Inferior infarct, old Confirmed by Mannie Pac [phone]) on 08/08/2023 3:00:58 PM         Imaging Studies ordered: I ordered imaging studies including CT imaging the head I independently visualized and interpreted imaging. I agree with the radiologist interpretation   Medicines ordered and prescription drug management: Meds ordered this encounter  Medications   potassium chloride  SA (KLOR-CON  M) CR tablet 20 mEq    -I have reviewed the patients home medicines and have made adjustments as needed   Cardiac Monitoring: The patient was maintained on a cardiac monitor.  I personally viewed and interpreted the cardiac monitored which showed an underlying rhythm of: Sinus rhythm  Social Determinants of Health:  Factors impacting patients care include: Dementia   Reevaluation: After the interventions noted above, I reevaluated the patient and found that they have :improved  Co morbidities that complicate the patient evaluation  Past Medical History:  Diagnosis Date   Acute cystitis without hematuria 01/15/2022   Adjustment disorder with depressed mood    Anxiety    Bell's palsy    1985   Dementia    Depressive  disorder, not elsewhere classified    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    can't do either   DVT (deep venous thrombosis), left early 2000's   RLE   Dysmetabolic syndrome X    Essential hypertension, benign    Headache(784.0) 05/2011   pretty often since they put me on Parkinson's medicine   History of shingles    as a teen and in my 30's   Mixed hyperlipidemia    Morbid obesity (HCC)    Neurodegenerative gait disorder    Personal history of thrombophlebitis    Syncope and collapse 12/06/2011   this am; I pass out fairly often   Type II or unspecified type diabetes mellitus without mention of complication, not stated as uncontrolled 12/06/2011   not anymore; had lap band OR   Urinary urgency 01/15/2022      Dispostion: I considered admission for this patient, however patient's labs are overall reassuring, patient is appropriate for outpatient follow-up.  Already has follow-up with neurology.     Final Clinical Impression(s) / ED Diagnoses Final diagnoses:  Seizure (HCC)     @PCDICTATION @    Mannie Pac T, DO 08/08/23 1503

## 2023-08-08 NOTE — ED Notes (Signed)
 Patient transported to CT

## 2023-08-08 NOTE — ED Notes (Signed)
 Lab contacted to add on urine

## 2023-08-08 NOTE — ED Triage Notes (Signed)
 Hx of seizures. Witnessed seizure today by family. Tonic clonic. Hx of dementia and a&ox1 at baseline. Not on seizure meds. No seizure episode for 10 years until today. Hx of Bell's Palsy and has a droop at baseline.   EMS VS: Bp 140/96 HR 60 O2 91-95% on [place] CBG 171 ETCO2 40

## 2023-08-08 NOTE — ED Provider Notes (Signed)
 UA not consistent with UTI.  Will discharge as per prior plan with Dr. Andria Meuse.   Pricilla Loveless, MD 08/08/23 [phone]

## 2023-08-08 NOTE — Discharge Instructions (Signed)
 While [name] was in the emergency room, showed a CT scan done of her head that was normal.  Her labs showed a slightly low potassium, so just encourage eating some more leafy greens, or bananas over the next few days.  Otherwise, her labs are normal.  Please have her follow-up with her neurologist at her appointment at the end of the month.  Return to the emergency department if there are any repeated seizure activities.

## 2023-08-10 NOTE — Progress Notes (Deleted)
Assessment/Plan:   1.  Parkinsons Disease             -Continue carbidopa/levodopa 25/100, 3 tablets 3 times per day  -discussed need for PT again but declined again.  They just ordered a walker for her    2.  Depression             -On Lexapro             -On mirtazapine             -Managed by PCP   3.  RBD and GAD             -On clonazepam 0.5 mg, half tablet at bedtime and will take half tablet as needed in the day for anxiety.             -PDMP reviewed.  No red flags.   4.  Chronic headache             -On gabapentin, 100 mg 3 times per day.             -Saw Dr. Zachery Conch in the remote past.   5.  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             -Patient last had neurocognitive testing in August, 2021 with Dr. Roseanne Reno.   -she thought that low dose donepezil 5 mg caused eds  -add memantine and work to 10 mg bid 6.  Vasovagal syncope  -This was in the setting of having a bowel movement in November, 2024 7.  Possible seizure  -Patient did have a seizure many years ago (possibly a decade) in the setting of Wellbutrin  -Patient was back in the emergency room January, 2025 with possible seizure  -We will do MRI brain with seizure protocol (with and without gadolinium)  Subjective:   [name] was seen today in follow up for Parkinsons disease.  My previous records were reviewed prior to todays visit as well as outside records available to me.  Much has happened since our next visit and numerous records are reviewed.  Patient presented to the hospital early November with near syncope.  This was felt vasovagal, in the setting of having a bowel movement.  Patient did not quite make it to the bathroom, however.  Emergency room workup was essentially unremarkable and she was discharged home.  Patient was back in the hospital for an admission December 4.  She presented with generalized weakness and confusion, found secondary to acute cystitis.  Patient was treated with  antibiotics and discharged home several days later.  Patient was back in the emergency room January 13 for possible seizure.  The any other history of seizure was many years ago, and was associated with Wellbutrin.  CT brain was unremarkable in the emergency room. Current prescribed movement disorder medications:  Carbidopa/levodopa 25/100, 3 tablets 3 times per day Mirtazapine, 30 mg at night Gabapentin, 100 mg 3 times per day Clonazepam, 0.25 mg twice daily (PDMP reviewed and last filled 09/13/2022).  Prior meds: aricept, 5 mg (tired and pt d/c)    ALLERGIES:   Allergies  Allergen Reactions   Codeine Sulfate     REACTION: hallucinations   Penicillins Anaphylaxis, Swelling and Rash    Tolerates Rocephin   Amoxapine And Related    Wellbutrin [Bupropion] Other (See Comments)    Seizures and hallucinations    CURRENT MEDICATIONS:  Outpatient Encounter Medications as of 08/12/2023  Medication Sig   ACCU-CHEK GUIDE test strip USE UP TO 4 TIMES DAILY AS DIRECTED   Accu-Chek Softclix Lancets lancets USE UP TO 4 TIMES DAILY AS DIRECTED   acetaminophen (TYLENOL) 500 MG tablet Take 500 mg by mouth every 6 (six) hours as needed for moderate pain (pain score 4-6) or mild pain (pain score 1-3).   aspirin 81 MG tablet Take 81 mg by mouth daily.   blood glucose meter kit and supplies KIT Dispense based on patient and insurance preference. Use up to four times daily as directed. (FOR ICD-9 250.00, 250.01).   calcium carbonate (TUMS EX) 750 MG chewable tablet Chew 1 tablet by mouth as needed.    carbidopa-levodopa (SINEMET IR) 25-100 MG tablet TAKE 3 TABLETS BY MOUTH 3 TIMES DAILY. (Patient taking differently: Take 3 tablets by mouth 3 (three) times daily.)   cetirizine (ZYRTEC) 10 MG tablet Take 10 mg by mouth at bedtime.   Cholecalciferol (VITAMIN D3) 1000 UNITS CAPS Take 1 capsule by mouth daily.   clonazePAM (KLONOPIN) 0.5 MG tablet TAKE 1/2 TABLET BY MOUTH AT BEDTIME AND 1/2 DAILY AS NEEDED  (Patient taking differently: Take 0.25 mg by mouth See admin instructions. TAKE 1/2 TABLET BY MOUTH AT BEDTIME AND 1/2 DAILY AS NEEDED)   donepezil (ARICEPT) 5 MG tablet TAKE 1 TABLET BY MOUTH EVERYDAY AT BEDTIME (Patient taking differently: Take 5 mg by mouth at bedtime.)   escitalopram (LEXAPRO) 10 MG tablet TAKE 1 TABLET BY MOUTH EVERY DAY (Patient taking differently: Take 10 mg by mouth in the morning.)   feeding supplement (ENSURE COMPLETE) LIQD Take 237 mLs by mouth 2 (two) times daily between meals. (Patient taking differently: Take 237 mLs by mouth in the morning.)   fluticasone (FLONASE) 50 MCG/ACT nasal spray Place 2 sprays into both nostrils daily as needed for allergies.   gabapentin (NEURONTIN) 100 MG capsule TAKE 1 CAPSULE BY MOUTH THREE TIMES A DAY (Patient taking differently: Take 100 mg by mouth 3 (three) times daily.)   ibuprofen (ADVIL) 200 MG tablet Take 200 mg by mouth every 6 (six) hours as needed for fever, headache, mild pain (pain score 1-3) or moderate pain (pain score 4-6).   memantine (NAMENDA) 10 MG tablet Take 1 tablet (10 mg total) by mouth 2 (two) times daily.   mirtazapine (REMERON) 30 MG tablet TAKE 1 TABLET (30 MG TOTAL) BY MOUTH AT BEDTIME. FOR SLEEP.   montelukast (SINGULAIR) 10 MG tablet TAKE 1 TABLET (10 MG TOTAL) BY MOUTH AT BEDTIME. FOR ALLERGIES   naproxen sodium (ALEVE) 220 MG tablet Take 220 mg by mouth daily as needed.   rosuvastatin (CRESTOR) 5 MG tablet TAKE 1 TABLET (5 MG TOTAL) BY MOUTH EVERY EVENING. FOR CHOLESTEROL.   [DISCONTINUED] simvastatin (ZOCOR) 20 MG tablet TAKE 1 TABLET BY MOUTH EVERY DAY AT BEDTIME FOR CHOLESTEROL   No facility-administered encounter medications on file as of 08/12/2023.    Objective:   PHYSICAL EXAMINATION:    VITALS:   There were no vitals filed for this visit.     GEN:  The patient appears stated age and is in NAD. HEENT:  Normocephalic, atraumatic.  The mucous membranes are moist. The superficial temporal  arteries are without ropiness or tenderness. CV:  RRR Lungs:  CTAB but there is upper airway noises   Neurological examination:  Orientation: The patient is alert and oriented x3. Cranial nerves: There is good facial symmetry with minimal facial hypomimia. The speech is fluent and clear. Soft palate rises symmetrically  and there is no tongue deviation. Hearing is decreased  to conversational tone. Sensation: Sensation is intact to light touch throughout Motor: Strength is at least antigravity x4.  Movement examination: Tone: There is nl tone in the UE/LE Abnormal movements: none Coordination:  There is no decremation with any form of RAMS, including alternating supination and pronation of the forearm, hand opening and closing, finger taps, heel taps and toe taps.  Gait and Station: Patient pushes off of the chair to arise. The patient's stride length is slow and tenuous and a bit unstable (similar to last visit) I have reviewed and interpreted the following labs independently    Chemistry      Component Value Date/Time   NA 139 08/08/2023 1104   K 3.4 (L) 08/08/2023 1104   CL 99 08/08/2023 1104   CO2 29 08/08/2023 1104   BUN 12 08/08/2023 1104   CREATININE 0.71 08/08/2023 1104   CREATININE 0.69 09/03/2022 1614      Component Value Date/Time   CALCIUM 9.3 08/08/2023 1104   ALKPHOS 65 08/08/2023 1104   AST 10 (L) 08/08/2023 1104   ALT 6 08/08/2023 1104   BILITOT 1.0 08/08/2023 1104       Lab Results  Component Value Date   WBC 8.6 08/08/2023   HGB 12.5 08/08/2023   HCT 39.2 08/08/2023   MCV 91.0 08/08/2023   PLT 210 08/08/2023    Lab Results  Component Value Date   TSH 3.071 06/30/2023     Total time spent on today's visit was *** minutes, including both face-to-face time and nonface-to-face time.  Time included that spent on review of records (prior notes available to me/labs/imaging if pertinent), discussing treatment and goals, answering patient's questions and  coordinating care.  Cc:  Doreene Nest, NP

## 2023-08-12 ENCOUNTER — Other Ambulatory Visit: Payer: Self-pay

## 2023-08-12 ENCOUNTER — Emergency Department
Admission: EM | Admit: 2023-08-12 | Discharge: 2023-08-12 | Discharge status: Against Medical Advice | Payer: Medicare Other | Attending: Emergency Medicine | Admitting: Emergency Medicine

## 2023-08-12 ENCOUNTER — Ambulatory Visit: Payer: Medicare Other | Admitting: Neurology

## 2023-08-12 DIAGNOSIS — F0390 Unspecified dementia without behavioral disturbance: Secondary | ICD-10-CM | POA: Insufficient documentation

## 2023-08-12 DIAGNOSIS — Z5321 Procedure and treatment not carried out due to patient leaving prior to being seen by health care provider: Secondary | ICD-10-CM | POA: Insufficient documentation

## 2023-08-12 DIAGNOSIS — R569 Unspecified convulsions: Secondary | ICD-10-CM | POA: Diagnosis not present

## 2023-08-12 LAB — CBC
HCT: 40.2 % (ref 36.0–46.0)
Hemoglobin: 12.9 g/dL (ref 12.0–15.0)
MCH: 29.3 pg (ref 26.0–34.0)
MCHC: 32.1 g/dL (ref 30.0–36.0)
MCV: 91.4 fL (ref 80.0–100.0)
Platelets: 217 10*3/uL (ref 150–400)
RBC: 4.4 MIL/uL (ref 3.87–5.11)
RDW: 13.8 % (ref 11.5–15.5)
WBC: 9.1 10*3/uL (ref 4.0–10.5)
nRBC: 0.2 % (ref 0.0–0.2)

## 2023-08-12 LAB — BASIC METABOLIC PANEL
Anion gap: 10 (ref 5–15)
BUN: 17 mg/dL (ref 8–23)
CO2: 28 mmol/L (ref 22–32)
Calcium: 9 mg/dL (ref 8.9–10.3)
Chloride: 102 mmol/L (ref 98–111)
Creatinine, Ser: 0.56 mg/dL (ref 0.44–1.00)
GFR, Estimated: >60 mL/min (ref >60)
Glucose, Bld: 208 mg/dL — ABNORMAL HIGH (ref 70–99)
Potassium: 3.4 mmol/L — ABNORMAL LOW (ref 3.5–5.1)
Sodium: 140 mmol/L (ref 135–145)

## 2023-08-12 NOTE — ED Triage Notes (Addendum)
Pt to ed from moe via POV for seizures. Pt was seen on 1/13 at [facility] for same. Pt was on her way to [facility] today for a visit and she had two witnessed seizures by the family. Per family they lasted like 5 seconds and was one right after the other. No urination on herself. No trauma noted to her tongue. Pt has drooping in her face but per family that is normal. Pt is alert and oriented to baseline per family as she has HX of dementia. Pt has apt with Neuro at [facility] in April. Pt takes no meds for seizures. Pt remembers entire event. Says she saw something purple.

## 2023-08-16 ENCOUNTER — Encounter: Payer: Self-pay | Admitting: Psychology

## 2023-08-16 DIAGNOSIS — F411 Generalized anxiety disorder: Secondary | ICD-10-CM | POA: Insufficient documentation

## 2023-08-16 DIAGNOSIS — G4752 REM sleep behavior disorder: Secondary | ICD-10-CM | POA: Insufficient documentation

## 2023-08-17 ENCOUNTER — Encounter (HOSPITAL_COMMUNITY): Payer: Self-pay | Admitting: Internal Medicine

## 2023-08-17 ENCOUNTER — Encounter: Payer: Self-pay | Admitting: Psychology

## 2023-08-17 ENCOUNTER — Ambulatory Visit: Payer: Self-pay

## 2023-08-17 ENCOUNTER — Inpatient Hospital Stay (HOSPITAL_COMMUNITY)
Admission: EM | Admit: 2023-08-17 | Discharge: 2023-08-19 | DRG: 101 | Disposition: A | Discharge status: Home Health | Payer: Medicare Other | Attending: Internal Medicine | Admitting: Internal Medicine

## 2023-08-17 ENCOUNTER — Inpatient Hospital Stay (HOSPITAL_COMMUNITY): Payer: Medicare Other

## 2023-08-17 ENCOUNTER — Emergency Department (HOSPITAL_COMMUNITY): Payer: Medicare Other

## 2023-08-17 ENCOUNTER — Institutional Professional Consult (permissible substitution): Payer: Medicare Other | Admitting: Psychology

## 2023-08-17 DIAGNOSIS — Z825 Family history of asthma and other chronic lower respiratory diseases: Secondary | ICD-10-CM

## 2023-08-17 DIAGNOSIS — Z8249 Family history of ischemic heart disease and other diseases of the circulatory system: Secondary | ICD-10-CM

## 2023-08-17 DIAGNOSIS — Z7982 Long term (current) use of aspirin: Secondary | ICD-10-CM

## 2023-08-17 DIAGNOSIS — Z79899 Other long term (current) drug therapy: Secondary | ICD-10-CM

## 2023-08-17 DIAGNOSIS — G9389 Other specified disorders of brain: Secondary | ICD-10-CM | POA: Diagnosis not present

## 2023-08-17 DIAGNOSIS — F411 Generalized anxiety disorder: Secondary | ICD-10-CM | POA: Diagnosis present

## 2023-08-17 DIAGNOSIS — F418 Other specified anxiety disorders: Secondary | ICD-10-CM

## 2023-08-17 DIAGNOSIS — M810 Age-related osteoporosis without current pathological fracture: Secondary | ICD-10-CM | POA: Diagnosis present

## 2023-08-17 DIAGNOSIS — H903 Sensorineural hearing loss, bilateral: Secondary | ICD-10-CM | POA: Diagnosis not present

## 2023-08-17 DIAGNOSIS — F02811 Dementia in other diseases classified elsewhere, unspecified severity, with agitation: Secondary | ICD-10-CM | POA: Diagnosis present

## 2023-08-17 DIAGNOSIS — Z888 Allergy status to other drugs, medicaments and biological substances status: Secondary | ICD-10-CM

## 2023-08-17 DIAGNOSIS — E782 Mixed hyperlipidemia: Secondary | ICD-10-CM | POA: Diagnosis not present

## 2023-08-17 DIAGNOSIS — Z8619 Personal history of other infectious and parasitic diseases: Secondary | ICD-10-CM | POA: Diagnosis not present

## 2023-08-17 DIAGNOSIS — Z880 Allergy status to penicillin: Secondary | ICD-10-CM

## 2023-08-17 DIAGNOSIS — Z833 Family history of diabetes mellitus: Secondary | ICD-10-CM | POA: Diagnosis not present

## 2023-08-17 DIAGNOSIS — Z821 Family history of blindness and visual loss: Secondary | ICD-10-CM | POA: Diagnosis not present

## 2023-08-17 DIAGNOSIS — G20A1 Parkinson's disease without dyskinesia, without mention of fluctuations: Secondary | ICD-10-CM | POA: Diagnosis not present

## 2023-08-17 DIAGNOSIS — Z961 Presence of intraocular lens: Secondary | ICD-10-CM | POA: Diagnosis present

## 2023-08-17 DIAGNOSIS — G214 Vascular parkinsonism: Secondary | ICD-10-CM | POA: Diagnosis present

## 2023-08-17 DIAGNOSIS — Z885 Allergy status to narcotic agent status: Secondary | ICD-10-CM

## 2023-08-17 DIAGNOSIS — E1165 Type 2 diabetes mellitus with hyperglycemia: Secondary | ICD-10-CM | POA: Diagnosis not present

## 2023-08-17 DIAGNOSIS — G4089 Other seizures: Principal | ICD-10-CM | POA: Diagnosis present

## 2023-08-17 DIAGNOSIS — W19XXXA Unspecified fall, initial encounter: Secondary | ICD-10-CM | POA: Diagnosis not present

## 2023-08-17 DIAGNOSIS — Z86718 Personal history of other venous thrombosis and embolism: Secondary | ICD-10-CM

## 2023-08-17 DIAGNOSIS — K219 Gastro-esophageal reflux disease without esophagitis: Secondary | ICD-10-CM | POA: Diagnosis not present

## 2023-08-17 DIAGNOSIS — R9431 Abnormal electrocardiogram [ECG] [EKG]: Secondary | ICD-10-CM | POA: Diagnosis not present

## 2023-08-17 DIAGNOSIS — I10 Essential (primary) hypertension: Secondary | ICD-10-CM | POA: Diagnosis present

## 2023-08-17 DIAGNOSIS — R410 Disorientation, unspecified: Secondary | ICD-10-CM

## 2023-08-17 DIAGNOSIS — G319 Degenerative disease of nervous system, unspecified: Secondary | ICD-10-CM | POA: Diagnosis not present

## 2023-08-17 DIAGNOSIS — F0284 Dementia in other diseases classified elsewhere, unspecified severity, with anxiety: Secondary | ICD-10-CM | POA: Diagnosis present

## 2023-08-17 DIAGNOSIS — S0993XA Unspecified injury of face, initial encounter: Secondary | ICD-10-CM | POA: Diagnosis not present

## 2023-08-17 DIAGNOSIS — S199XXA Unspecified injury of neck, initial encounter: Secondary | ICD-10-CM | POA: Diagnosis not present

## 2023-08-17 DIAGNOSIS — S0990XA Unspecified injury of head, initial encounter: Secondary | ICD-10-CM | POA: Diagnosis not present

## 2023-08-17 DIAGNOSIS — R296 Repeated falls: Secondary | ICD-10-CM | POA: Diagnosis present

## 2023-08-17 DIAGNOSIS — R569 Unspecified convulsions: Principal | ICD-10-CM

## 2023-08-17 LAB — MAGNESIUM: Magnesium: 2.1 mg/dL (ref 1.7–2.4)

## 2023-08-17 LAB — CREATININE, SERUM
Creatinine, Ser: 0.65 mg/dL (ref 0.44–1.00)
GFR, Estimated: >60 mL/min (ref >60)

## 2023-08-17 LAB — CBC
HCT: 39.1 % (ref 36.0–46.0)
Hemoglobin: 12.3 g/dL (ref 12.0–15.0)
MCH: 28.5 pg (ref 26.0–34.0)
MCHC: 31.5 g/dL (ref 30.0–36.0)
MCV: 90.5 fL (ref 80.0–100.0)
Platelets: 211 10*3/uL (ref 150–400)
RBC: 4.32 MIL/uL (ref 3.87–5.11)
RDW: 14 % (ref 11.5–15.5)
WBC: 10.8 10*3/uL — ABNORMAL HIGH (ref 4.0–10.5)
nRBC: 0 % (ref 0.0–0.2)

## 2023-08-17 LAB — CBC WITH DIFFERENTIAL/PLATELET
Abs Immature Granulocytes: 0.06 10*3/uL (ref 0.00–0.07)
Basophils Absolute: 0 10*3/uL (ref 0.0–0.1)
Basophils Relative: 0 %
Eosinophils Absolute: 0.1 10*3/uL (ref 0.0–0.5)
Eosinophils Relative: 0 %
HCT: 46.6 % — ABNORMAL HIGH (ref 36.0–46.0)
Hemoglobin: 14.7 g/dL (ref 12.0–15.0)
Immature Granulocytes: 1 %
Lymphocytes Relative: 7 %
Lymphs Abs: 0.8 10*3/uL (ref 0.7–4.0)
MCH: 28.4 pg (ref 26.0–34.0)
MCHC: 31.5 g/dL (ref 30.0–36.0)
MCV: 90 fL (ref 80.0–100.0)
Monocytes Absolute: 0.5 10*3/uL (ref 0.1–1.0)
Monocytes Relative: 4 %
Neutro Abs: 10.3 10*3/uL — ABNORMAL HIGH (ref 1.7–7.7)
Neutrophils Relative %: 88 %
Platelets: 152 10*3/uL (ref 150–400)
RBC: 5.18 MIL/uL — ABNORMAL HIGH (ref 3.87–5.11)
RDW: 14 % (ref 11.5–15.5)
WBC: 11.7 10*3/uL — ABNORMAL HIGH (ref 4.0–10.5)
nRBC: 0 % (ref 0.0–0.2)

## 2023-08-17 LAB — COMPREHENSIVE METABOLIC PANEL
ALT: <5 U/L (ref 0–44)
AST: 10 U/L — ABNORMAL LOW (ref 15–41)
Albumin: 3.9 g/dL (ref 3.5–5.0)
Alkaline Phosphatase: 78 U/L (ref 38–126)
Anion gap: 13 (ref 5–15)
BUN: <5 mg/dL — ABNORMAL LOW (ref 8–23)
CO2: 26 mmol/L (ref 22–32)
Calcium: 9.6 mg/dL (ref 8.9–10.3)
Chloride: 100 mmol/L (ref 98–111)
Creatinine, Ser: UNDETERMINED mg/dL (ref 0.44–1.00)
Glucose, Bld: 135 mg/dL — ABNORMAL HIGH (ref 70–99)
Potassium: 3.6 mmol/L (ref 3.5–5.1)
Sodium: 139 mmol/L (ref 135–145)
Total Bilirubin: 0.7 mg/dL (ref 0.0–1.2)
Total Protein: 7.3 g/dL (ref 6.5–8.1)

## 2023-08-17 LAB — GLUCOSE, CAPILLARY: Glucose-Capillary: 106 mg/dL — ABNORMAL HIGH (ref 70–99)

## 2023-08-17 LAB — CK: Total CK: 18 U/L — ABNORMAL LOW (ref 38–234)

## 2023-08-17 LAB — CBG MONITORING, ED
Glucose-Capillary: 126 mg/dL — ABNORMAL HIGH (ref 70–99)
Glucose-Capillary: 103 mg/dL — ABNORMAL HIGH (ref 70–99)

## 2023-08-17 MED ORDER — ESCITALOPRAM OXALATE 10 MG PO TABS
10.0000 mg | ORAL_TABLET | Freq: Every morning | ORAL | Status: DC
Start: 2023-08-18 — End: 2023-08-19
  Administered 2023-08-18 – 2023-08-19 (×2): 10 mg via ORAL
  Filled 2023-08-17 (×2): qty 1

## 2023-08-17 MED ORDER — ASPIRIN 81 MG PO TBEC
81.0000 mg | DELAYED_RELEASE_TABLET | Freq: Every day | ORAL | Status: DC
  Administered 2023-08-18 – 2023-08-19 (×2): 81 mg via ORAL
  Filled 2023-08-17 (×2): qty 1

## 2023-08-17 MED ORDER — MIRTAZAPINE 15 MG PO TABS
30.0000 mg | ORAL_TABLET | Freq: Every day | ORAL | Status: DC
Start: 2023-08-17 — End: 2023-08-19
  Administered 2023-08-18: 30 mg via ORAL
  Filled 2023-08-17: qty 2

## 2023-08-17 MED ORDER — VITAMIN D 25 MCG (1000 UNIT) PO TABS
1000.0000 [IU] | ORAL_TABLET | Freq: Every day | ORAL | Status: DC
  Administered 2023-08-18 – 2023-08-19 (×2): 1000 [IU] via ORAL
  Filled 2023-08-17 (×2): qty 1

## 2023-08-17 MED ORDER — LORAZEPAM 2 MG/ML IJ SOLN: 2.0000 mg | Freq: Once | INTRAMUSCULAR | Status: AC

## 2023-08-17 MED ORDER — INSULIN ASPART 100 UNIT/ML IJ SOLN
0.0000 [IU] | Freq: Every day | INTRAMUSCULAR | Status: DC
  Filled 2023-08-17: qty 0.05

## 2023-08-17 MED ORDER — CARBIDOPA-LEVODOPA 25-100 MG PO TABS
3.0000 | ORAL_TABLET | Freq: Three times a day (TID) | ORAL | Status: DC
  Administered 2023-08-17 – 2023-08-19 (×6): 3 via ORAL
  Filled 2023-08-17 (×7): qty 3

## 2023-08-17 MED ORDER — DONEPEZIL HCL 10 MG PO TABS
5.0000 mg | ORAL_TABLET | Freq: Every day | ORAL | Status: DC
  Administered 2023-08-18: 5 mg via ORAL
  Filled 2023-08-17: qty 1

## 2023-08-17 MED ORDER — ACETAMINOPHEN 650 MG RE SUPP: 650.0000 mg | Freq: Four times a day (QID) | RECTAL | Status: DC | PRN

## 2023-08-17 MED ORDER — ENOXAPARIN SODIUM 40 MG/0.4ML IJ SOSY
40.0000 mg | PREFILLED_SYRINGE | INTRAMUSCULAR | Status: DC
  Administered 2023-08-18 – 2023-08-19 (×2): 40 mg via SUBCUTANEOUS
  Filled 2023-08-17 (×2): qty 0.4

## 2023-08-17 MED ORDER — MONTELUKAST SODIUM 10 MG PO TABS
10.0000 mg | ORAL_TABLET | Freq: Every day | ORAL | Status: DC
Start: 2023-08-17 — End: 2023-08-19
  Administered 2023-08-18: 10 mg via ORAL
  Filled 2023-08-17 (×2): qty 1

## 2023-08-17 MED ORDER — MEMANTINE HCL 10 MG PO TABS
10.0000 mg | ORAL_TABLET | Freq: Two times a day (BID) | ORAL | Status: DC
  Administered 2023-08-18 – 2023-08-19 (×3): 10 mg via ORAL
  Filled 2023-08-17 (×3): qty 1

## 2023-08-17 MED ORDER — LORAZEPAM 1 MG PO TABS: 1.0000 mg | ORAL_TABLET | Freq: Once | ORAL | Status: DC | PRN

## 2023-08-17 MED ORDER — GABAPENTIN 100 MG PO CAPS
100.0000 mg | ORAL_CAPSULE | Freq: Three times a day (TID) | ORAL | Status: DC
  Administered 2023-08-17 – 2023-08-19 (×6): 100 mg via ORAL
  Filled 2023-08-17 (×6): qty 1

## 2023-08-17 MED ORDER — LORATADINE 10 MG PO TABS
10.0000 mg | ORAL_TABLET | Freq: Every day | ORAL | Status: DC
Start: 2023-08-17 — End: 2023-08-19
  Administered 2023-08-18 – 2023-08-19 (×2): 10 mg via ORAL
  Filled 2023-08-17 (×2): qty 1

## 2023-08-17 MED ORDER — CLONAZEPAM 0.125 MG PO TBDP
0.2500 mg | ORAL_TABLET | Freq: Two times a day (BID) | ORAL | Status: DC
  Administered 2023-08-17 – 2023-08-19 (×4): 0.25 mg via ORAL
  Filled 2023-08-17 (×4): qty 2

## 2023-08-17 MED ORDER — POLYETHYLENE GLYCOL 3350 17 G PO PACK: 17.0000 g | PACK | Freq: Every day | ORAL | Status: DC | PRN

## 2023-08-17 MED ORDER — SODIUM CHLORIDE 0.9% FLUSH
3.0000 mL | Freq: Two times a day (BID) | INTRAVENOUS | Status: DC
  Administered 2023-08-17 – 2023-08-19 (×5): 3 mL via INTRAVENOUS

## 2023-08-17 MED ORDER — LORAZEPAM 2 MG/ML IJ SOLN
INTRAMUSCULAR | Status: AC
  Administered 2023-08-17: 2 mg via INTRAVENOUS
  Filled 2023-08-17: qty 1

## 2023-08-17 MED ORDER — ACETAMINOPHEN 325 MG PO TABS: 650.0000 mg | ORAL_TABLET | Freq: Four times a day (QID) | ORAL | Status: DC | PRN

## 2023-08-17 MED ORDER — ROSUVASTATIN CALCIUM 5 MG PO TABS
5.0000 mg | ORAL_TABLET | Freq: Every evening | ORAL | Status: DC
Start: 2023-08-17 — End: 2023-08-19
  Administered 2023-08-18: 5 mg via ORAL
  Filled 2023-08-17 (×2): qty 1

## 2023-08-17 MED ORDER — INSULIN ASPART 100 UNIT/ML IJ SOLN
0.0000 [IU] | Freq: Three times a day (TID) | INTRAMUSCULAR | Status: DC
  Filled 2023-08-17: qty 0.09

## 2023-08-17 NOTE — Assessment & Plan Note (Signed)
Chronic diagnosis.  Continue with Sinemet.  Patient still has some tremors of her face and tongue.  Look forward to neurology input in this regard

## 2023-08-17 NOTE — Progress Notes (Addendum)
Addendum:   Not at baseline -- more confused than normal, intermittently not responding properly, tongue movements family attributes to dentures not being in place but which Dr. 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 Wilkie Aye.  History provided: "78 year old female presents emergency department with possible seizure-like activity, witnessed by family member at bedside who lives with her.  They report a possible history of seizures when she was on Wellbutrin, requiring antiepileptics.  But after they took her off the Wellbutrin they weaned her from the antiepileptics and she had not had any seizure-like activity until about a week ago.  She was evaluated in the ER and sent home with a reassuring workup.  She returns today after  being found on the ground next to her bed with her right arm above her head shaking with gurgling noises.  There also seems to have been a postictal period.  Patient only remembers going to bed last night to her usual time around 8 PM.  She feels fatigued with a headache but otherwise back to baseline from a neurologic standpoint.  The family member at bedside also reveals episodes of right sided facial twitching throughout the week.  Used to follow-up with neurology as an outpatient but currently has no continued care with neurology."  On chart review has a appointment with Dr. Zannie Cove in April in [place] [facility], used to see Dr. Arbutus Leas but has now moved to [place].  On her medication list there is clonazepam half tablet at night, as well as an additional as needed tablet during the day as well as gabapentin 100 mg 3 times daily which has been used for headache management per prior neurology notes.  We discussed that benzodiazepine withdrawal can be associated with seizures and this should be reviewed with family.  If the patient is taking clonazepam fairly regularly would simply increase the dose for example from half a tablet twice a day to 1 tablet twice a day. Counsel patient/family to not miss benzos due to risk of withdrawal seizures  Indications for admission / inpatient EEG monitoring would include frequent spells not responding to increase in antiseizure medications for spell characterization, or if patient is not returning to baseline after seizure activity  In addition to infectious workup started by the ED (UA), would recommend MRI brain with and without contrast to rule out PRES or other  acute intracranial process  Data reviewed:  Current vital signs: BP (!) 181/77   Pulse 67   Temp 98.8 F (37.1 C) (Oral)   Resp 18   SpO2 97%  Vital signs in last 24 hours: Temp:  [98.8 F (37.1 C)] 98.8 F (37.1 C) (01/22 1149) Pulse Rate:  [64-67] 67 (01/22 1415) Resp:  [18-23] 18 (01/22  1415) BP: (176-206)/(77-93) 181/77 (01/22 1415) SpO2:  [97 %-98 %] 97 % (01/22 1415)   CT head, C-spine, Maxillofacial reports reviewed 1. No CT evidence of intracranial injury. 2. No acute facial bone fracture. 3. No acute fracture or traumatic subluxation of the cervical spine.  Basic Metabolic Panel: Recent Labs  Lab 08/12/23 1504 08/17/23 1155  NA 140 139  K 3.4* 3.6  CL 102 100  CO2 28 26  GLUCOSE 208* 135*  BUN 17 <5*  CREATININE 0.56 QUANTITY NOT SUFFICIENT, UNABLE TO PERFORM TEST  CALCIUM 9.0 9.6    CBC: Recent Labs  Lab 08/12/23 1504 08/17/23 1155  WBC 9.1 11.7*  NEUTROABS  --  10.3*  HGB 12.9 14.7  HCT 40.2 46.6*  MCV 91.4 90.0  PLT 217 152    Coagulation Studies: No results for input(s): "LABPROT", "INR" in the last 72 hours.   12 min spent in care, majority in direct discussion with EDP who has notified patient/family of neurological consultation

## 2023-08-17 NOTE — H&P (Signed)
History and Physical    Patient: [name]:[id number] DOB: Jul 22, 1946 DOA: 08/17/2023 DOS: the patient was seen and examined on 08/17/2023 PCP: Martha Nest, NP  Patient coming from: Home  Chief Complaint:  Chief Complaint  Patient presents with   Seizures   HPI: [name] is a 78 y.o. female with medical history significant of dementia, Parkinson disease.  Patient is unable to provide much history therefore.  History is obtained primarily from patient's daughter [name] at the bedside.  Patient is chronically not on any antiepileptic drug therapy.  And patient seems to have chronic gait instability.  With chronic requirement of walker or wheelchair use.  In spite of this patient has had several falls over the last several months.  About a week ago, daughter recalls that the patient was noted to have raised her right arm up into the air while standing in seem to have a tremor on the right arm and was thereafter lowered by the daughter onto the ground.  After which patient became sleepy.  Patient regained her consciousness within the couple of minutes and was awake thereafter.  Today patient was in her bedroom by herself.  Last known normal around 8 PM last evening when she went to bed.  Patient daughter reports hearing a falling sound and rushing into the patient's bedroom at around 10 AM to find the patient on the floor with her right arm again in the air in front of her / overhead with some shaking noticed.  And the patient had her eyes closed and nonresponsive.  911 was called.  Patient woke up after about 2 minutes per daughter.  However her return to mentation took much longer including till after she got to the ER.  There is no report of fever nausea vomiting diarrhea.  No report of focal weakness.  Patient has been noted to have some tremors of her face for the last several weeks.  Medical evaluation is sought. Review of Systems: As mentioned in the history of  present illness. All other systems reviewed and are negative. 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  Really didn't use.   Weight loss 46 pounds since last visit.  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   Past Surgical History:  Procedure Laterality Date   bladder tack  11/1995   BREAST BIOPSY  05/20/2000   bilaterally   BREAST CYST ASPIRATION  12/1998   bilaterally   CARDIAC CATHETERIZATION  2004   CATARACT EXTRACTION W/ INTRAOCULAR LENS  IMPLANT, BILATERAL     FRACTURE SURGERY  02/09/2006    bilateral elbows   FRACTURE SURGERY     right knee   FRACTURE SURGERY     tib plateau   KNEE ARTHROSCOPY  12/12/2002   right    LAPAROSCOPIC GASTRIC BANDING  2008   SEPTOPLASTY  08/1998   with antral window    TONSILLECTOMY AND ADENOIDECTOMY  1970   TUBAL LIGATION  1970's   Social History:  reports that she has never smoked. She has never used smokeless tobacco. She reports that she does not drink alcohol and does not use drugs.  Allergies  Allergen Reactions   Codeine Sulfate     REACTION: hallucinations   Penicillins Anaphylaxis, Swelling and Rash    Tolerates Rocephin   Amoxapine And Related    Wellbutrin [Bupropion] Other (See Comments)    Seizures and hallucinations    Family History  Problem Relation Age of Onset   Asthma Mother    COPD Father    Heart disease Father    Diabetes Sister    Vision loss Sister        legally blind   Hypertension Child    Hyperlipidemia Child    Diabetes Child    Diabetes Son    Breast cancer Neg Hx     [name] to Admission medications   Medication Sig Start Date End Date Taking? Authorizing Provider  acetaminophen (TYLENOL) 500 MG tablet Take 500 mg by mouth every 6 (six) hours as needed for moderate pain (pain score 4-6) or mild pain (pain score 1-3).   Yes [provider]  aspirin 81 MG tablet Take 81 mg by mouth daily.   Yes [provider]  carbidopa-levodopa (SINEMET IR) 25-100 MG tablet TAKE 3 TABLETS BY MOUTH 3 TIMES DAILY. Patient taking differently: Take 3 tablets by mouth 3 (three) times daily. 05/24/23  Yes Tat, Octaviano Batty, DO  cetirizine (ZYRTEC) 10 MG tablet Take 10 mg by mouth at bedtime.   Yes [provider]  Cholecalciferol (VITAMIN D3) 1000 UNITS CAPS Take 1 capsule by mouth daily.   Yes [provider]  clonazePAM (KLONOPIN) 0.5 MG tablet TAKE 1/2 TABLET BY MOUTH AT BEDTIME AND 1/2 DAILY AS NEEDED Patient taking differently: Take 0.25 mg by mouth 2 (two) times daily. 06/20/23   Yes Tat, Octaviano Batty, DO  donepezil (ARICEPT) 5 MG tablet TAKE 1 TABLET BY MOUTH EVERYDAY AT BEDTIME Patient taking differently: Take 5 mg by mouth at bedtime. 06/20/23  Yes Tat, Octaviano Batty, DO  escitalopram (LEXAPRO) 10 MG tablet TAKE 1 TABLET BY MOUTH EVERY DAY Patient taking differently: Take 10 mg by mouth in the morning. 06/20/23  Yes Tat, Octaviano Batty, DO  gabapentin (NEURONTIN) 100 MG capsule TAKE 1 CAPSULE BY MOUTH THREE TIMES A DAY Patient taking differently: Take 100 mg by mouth 3 (three) times daily. 06/20/23  Yes Tat, Octaviano Batty, DO  memantine (NAMENDA) 10 MG tablet Take  1 tablet (10 mg total) by mouth 2 (two) times daily. 01/07/23  Yes Tat, Octaviano Batty, DO  mirtazapine (REMERON) 30 MG tablet TAKE 1 TABLET (30 MG TOTAL) BY MOUTH AT BEDTIME. FOR SLEEP. 06/20/23  Yes Martha Nest, NP  montelukast (SINGULAIR) 10 MG tablet TAKE 1 TABLET (10 MG TOTAL) BY MOUTH AT BEDTIME. FOR ALLERGIES 05/01/23  Yes Martha Nest, NP  rosuvastatin (CRESTOR) 5 MG tablet TAKE 1 TABLET (5 MG TOTAL) BY MOUTH EVERY EVENING. FOR CHOLESTEROL. 05/01/23  Yes Martha Nest, NP  ACCU-CHEK GUIDE test strip USE UP TO 4 TIMES DAILY AS DIRECTED 11/14/22   Martha Nest, NP  Accu-Chek Softclix Lancets lancets USE UP TO 4 TIMES DAILY AS DIRECTED 07/25/22   Martha Nest, NP  blood glucose meter kit and supplies KIT Dispense based on patient and insurance preference. Use up to four times daily as directed. (FOR ICD-9 250.00, 250.01). 04/15/22   Martha Nest, NP  calcium carbonate (TUMS EX) 750 MG chewable tablet Chew 1 tablet by mouth as needed.  Patient not taking: Reported on 08/17/2023    [provider]  fluticasone (FLONASE) 50 MCG/ACT nasal spray Place 2 sprays into both nostrils daily as needed for allergies. Patient not taking: Reported on 08/17/2023 10/02/20   [provider]  ibuprofen (ADVIL) 200 MG tablet Take 200 mg by mouth every 6 (six) hours as needed for fever, headache, mild  pain (pain score 1-3) or moderate pain (pain score 4-6). Patient not taking: Reported on 08/17/2023    [provider]  simvastatin (ZOCOR) 20 MG tablet TAKE 1 TABLET BY MOUTH EVERY DAY AT BEDTIME FOR CHOLESTEROL 10/01/19 01/02/20  Martha Nest, NP    Physical Exam: Vitals:   08/17/23 1149 08/17/23 1304 08/17/23 1415 08/17/23 1531  BP:  (!) 176/79 (!) 181/77   Pulse:  66 67   Resp: 18 (!) 21 18   Temp: 98.8 F (37.1 C)   98.5 F (36.9 C)  TempSrc: Oral   Oral  SpO2:  98% 97%    General: Patient is alert and awake, soft-spoken, hard of hearing, pleasantly interactive.  Does not appear to be distressed. Respiratory exam: Bilateral intravesicular Cardiovascular exam S1-2 normal Abdomen all quadrants are soft.  Nontender.  Extremities warm without edema Neurologically patient is alert and awake, she has some tremor of her tongue and facial muscles.  No resting tremor of any extremities noted.  No focal weakness is noted.  No spinal tenderness neck movements are free.  No clinical fracture is identified. Patient is not able to recall if she has had breakfast or events of the recent day today.  Patient is able to give her name and date of birth. Data Reviewed:  Labs on Admission:  Results for orders placed or performed during the hospital encounter of 08/17/23 (from the past 24 hours)  CBG monitoring, ED     Status: Abnormal   Collection Time: 08/17/23 11:48 AM  Result Value Ref Range   Glucose-Capillary 126 (H) 70 - 99 mg/dL  CBC with Differential     Status: Abnormal   Collection Time: 08/17/23 11:55 AM  Result Value Ref Range   WBC 11.7 (H) 4.0 - 10.5 K/uL   RBC 5.18 (H) 3.87 - 5.11 MIL/uL   Hemoglobin 14.7 12.0 - 15.0 g/dL   HCT 16.1 (H) 09.6 - 04.5 %   MCV 90.0 80.0 - 100.0 fL   MCH 28.4 26.0 - 34.0 pg  MCHC 31.5 30.0 - 36.0 g/dL   RDW 16.1 09.6 - 04.5 %   Platelets 152 150 - 400 K/uL   nRBC 0.0 0.0 - 0.2 %   Neutrophils Relative % 88 %   Neutro Abs 10.3 (H) 1.7  - 7.7 K/uL   Lymphocytes Relative 7 %   Lymphs Abs 0.8 0.7 - 4.0 K/uL   Monocytes Relative 4 %   Monocytes Absolute 0.5 0.1 - 1.0 K/uL   Eosinophils Relative 0 %   Eosinophils Absolute 0.1 0.0 - 0.5 K/uL   Basophils Relative 0 %   Basophils Absolute 0.0 0.0 - 0.1 K/uL   Immature Granulocytes 1 %   Abs Immature Granulocytes 0.06 0.00 - 0.07 K/uL  Comprehensive metabolic panel     Status: Abnormal   Collection Time: 08/17/23 11:55 AM  Result Value Ref Range   Sodium 139 135 - 145 mmol/L   Potassium 3.6 3.5 - 5.1 mmol/L   Chloride 100 98 - 111 mmol/L   CO2 26 22 - 32 mmol/L   Glucose, Bld 135 (H) 70 - 99 mg/dL   BUN <5 (L) 8 - 23 mg/dL   Creatinine, Ser QUANTITY NOT SUFFICIENT, UNABLE TO PERFORM TEST 0.44 - 1.00 mg/dL   Calcium 9.6 8.9 - 40.9 mg/dL   Total Protein 7.3 6.5 - 8.1 g/dL   Albumin 3.9 3.5 - 5.0 g/dL   AST 10 (L) 15 - 41 U/L   ALT <5 0 - 44 U/L   Alkaline Phosphatase 78 38 - 126 U/L   Total Bilirubin 0.7 0.0 - 1.2 mg/dL   GFR, Estimated NOT CALCULATED >60 mL/min   Anion gap 13 5 - 15  Magnesium     Status: None   Collection Time: 08/17/23  3:55 PM  Result Value Ref Range   Magnesium 2.1 1.7 - 2.4 mg/dL  CK     Status: Abnormal   Collection Time: 08/17/23  3:55 PM  Result Value Ref Range   Total CK 18 (L) 38 - 234 U/L  CBC     Status: Abnormal   Collection Time: 08/17/23  3:55 PM  Result Value Ref Range   WBC 10.8 (H) 4.0 - 10.5 K/uL   RBC 4.32 3.87 - 5.11 MIL/uL   Hemoglobin 12.3 12.0 - 15.0 g/dL   HCT 81.1 91.4 - 78.2 %   MCV 90.5 80.0 - 100.0 fL   MCH 28.5 26.0 - 34.0 pg   MCHC 31.5 30.0 - 36.0 g/dL   RDW 95.6 21.3 - 08.6 %   Platelets 211 150 - 400 K/uL   nRBC 0.0 0.0 - 0.2 %  Creatinine, serum     Status: None   Collection Time: 08/17/23  3:55 PM  Result Value Ref Range   Creatinine, Ser 0.65 0.44 - 1.00 mg/dL   GFR, Estimated >57 >84 mL/min    No results for input(s): "LIPASE", "AMYLASE" in the last 168 hours. No results for input(s): "AMMONIA"  in the last 168 hours. CBC: Recent Labs  Lab 08/12/23 1504 08/17/23 1155 08/17/23 1555  WBC 9.1 11.7* 10.8*  NEUTROABS  --  10.3*  --   HGB 12.9 14.7 12.3  HCT 40.2 46.6* 39.1  MCV 91.4 90.0 90.5  PLT 217 152 211   Cardiac Enzymes: Recent Labs  Lab 08/17/23 1555  CKTOTAL 18*    BNP (last 3 results) No results for input(s): "PROBNP" in the last 8760 hours. CBG: Recent Labs  Lab 08/17/23 1148  GLUCAP 126*  Radiological Exams on Admission:  CT Head Wo Contrast Result Date: 08/17/2023 CLINICAL DATA:  Seizure, new-onset, history of trauma; Polytrauma, blunt EXAM: CT HEAD WITHOUT CONTRAST CT MAXILLOFACIAL WITHOUT CONTRAST CT CERVICAL SPINE WITHOUT CONTRAST TECHNIQUE: Multidetector CT imaging of the head, cervical spine, and maxillofacial structures were performed using the standard protocol without intravenous contrast. Multiplanar CT image reconstructions of the cervical spine and maxillofacial structures were also generated. RADIATION DOSE REDUCTION: This exam was performed according to the departmental dose-optimization program which includes automated exposure control, adjustment of the mA and/or kV according to patient size and/or use of iterative reconstruction technique. COMPARISON:  Head CT 08/08/2023 FINDINGS: CT HEAD FINDINGS Brain: No hemorrhage. No hydrocephalus. No extra-axial fluid collection. No CT evidence of an acute cortical infarct. No mass effect. No mass lesion. Generalized volume loss. Vascular: No hyperdense vessel or unexpected calcification. Skull: Normal. Negative for fracture or focal lesion. Other: None. CT MAXILLOFACIAL FINDINGS Osseous: No fracture or mandibular dislocation. No destructive process. Orbits: Negative. No traumatic or inflammatory finding. Sinuses: No middle ear or mastoid effusion. Paranasal sinuses are notable for mucosal thickening in the left maxillary sinus. Bilateral lens replacement. Orbits are otherwise unremarkable. Postsurgical  changes from bilateral maxillary antrostomies. Soft tissues: Negative. CT CERVICAL SPINE FINDINGS Alignment: Normal. Skull base and vertebrae: No acute fracture. No primary bone lesion or focal pathologic process. Soft tissues and spinal canal: No prevertebral fluid or swelling. No visible canal hematoma. Disc levels:  No evidence of high-grade spinal canal stenosis Upper chest: Negative. Other: None IMPRESSION: 1. No CT evidence of intracranial injury. 2. No acute facial bone fracture. 3. No acute fracture or traumatic subluxation of the cervical spine. Electronically Signed   By: Lorenza Cambridge M.D.   On: 08/17/2023 13:41   CT Cervical Spine Wo Contrast Result Date: 08/17/2023 CLINICAL DATA:  Seizure, new-onset, history of trauma; Polytrauma, blunt EXAM: CT HEAD WITHOUT CONTRAST CT MAXILLOFACIAL WITHOUT CONTRAST CT CERVICAL SPINE WITHOUT CONTRAST TECHNIQUE: Multidetector CT imaging of the head, cervical spine, and maxillofacial structures were performed using the standard protocol without intravenous contrast. Multiplanar CT image reconstructions of the cervical spine and maxillofacial structures were also generated. RADIATION DOSE REDUCTION: This exam was performed according to the departmental dose-optimization program which includes automated exposure control, adjustment of the mA and/or kV according to patient size and/or use of iterative reconstruction technique. COMPARISON:  Head CT 08/08/2023 FINDINGS: CT HEAD FINDINGS Brain: No hemorrhage. No hydrocephalus. No extra-axial fluid collection. No CT evidence of an acute cortical infarct. No mass effect. No mass lesion. Generalized volume loss. Vascular: No hyperdense vessel or unexpected calcification. Skull: Normal. Negative for fracture or focal lesion. Other: None. CT MAXILLOFACIAL FINDINGS Osseous: No fracture or mandibular dislocation. No destructive process. Orbits: Negative. No traumatic or inflammatory finding. Sinuses: No middle ear or mastoid  effusion. Paranasal sinuses are notable for mucosal thickening in the left maxillary sinus. Bilateral lens replacement. Orbits are otherwise unremarkable. Postsurgical changes from bilateral maxillary antrostomies. Soft tissues: Negative. CT CERVICAL SPINE FINDINGS Alignment: Normal. Skull base and vertebrae: No acute fracture. No primary bone lesion or focal pathologic process. Soft tissues and spinal canal: No prevertebral fluid or swelling. No visible canal hematoma. Disc levels:  No evidence of high-grade spinal canal stenosis Upper chest: Negative. Other: None IMPRESSION: 1. No CT evidence of intracranial injury. 2. No acute facial bone fracture. 3. No acute fracture or traumatic subluxation of the cervical spine. Electronically Signed   By: Lorenza Cambridge M.D.   On: 08/17/2023 13:41  CT Maxillofacial WO CM Result Date: 08/17/2023 CLINICAL DATA:  Seizure, new-onset, history of trauma; Polytrauma, blunt EXAM: CT HEAD WITHOUT CONTRAST CT MAXILLOFACIAL WITHOUT CONTRAST CT CERVICAL SPINE WITHOUT CONTRAST TECHNIQUE: Multidetector CT imaging of the head, cervical spine, and maxillofacial structures were performed using the standard protocol without intravenous contrast. Multiplanar CT image reconstructions of the cervical spine and maxillofacial structures were also generated. RADIATION DOSE REDUCTION: This exam was performed according to the departmental dose-optimization program which includes automated exposure control, adjustment of the mA and/or kV according to patient size and/or use of iterative reconstruction technique. COMPARISON:  Head CT 08/08/2023 FINDINGS: CT HEAD FINDINGS Brain: No hemorrhage. No hydrocephalus. No extra-axial fluid collection. No CT evidence of an acute cortical infarct. No mass effect. No mass lesion. Generalized volume loss. Vascular: No hyperdense vessel or unexpected calcification. Skull: Normal. Negative for fracture or focal lesion. Other: None. CT MAXILLOFACIAL FINDINGS  Osseous: No fracture or mandibular dislocation. No destructive process. Orbits: Negative. No traumatic or inflammatory finding. Sinuses: No middle ear or mastoid effusion. Paranasal sinuses are notable for mucosal thickening in the left maxillary sinus. Bilateral lens replacement. Orbits are otherwise unremarkable. Postsurgical changes from bilateral maxillary antrostomies. Soft tissues: Negative. CT CERVICAL SPINE FINDINGS Alignment: Normal. Skull base and vertebrae: No acute fracture. No primary bone lesion or focal pathologic process. Soft tissues and spinal canal: No prevertebral fluid or swelling. No visible canal hematoma. Disc levels:  No evidence of high-grade spinal canal stenosis Upper chest: Negative. Other: None IMPRESSION: 1. No CT evidence of intracranial injury. 2. No acute facial bone fracture. 3. No acute fracture or traumatic subluxation of the cervical spine. Electronically Signed   By: Lorenza Cambridge M.D.   On: 08/17/2023 13:41    chest X-ray - not done.  EKG: Independently reviewed. NSR  No intake/output data recorded. No intake/output data recorded.   Assessment and Plan: * Seizures (HCC) Patient has had seizure-like activity in the last 1 week.  However definitive diagnosis of seizures is hard to make given that patient does have some tremors that are attributable to Parkinson's disease.  Further falls have been ongoing for the patient.  And she has poor recollection of events and limited cognition at baseline.  Patient currently is at her neurologic baseline.  Will do neurochecks every 4 hourly  CT head is nonactionable  Will maintain on telemetry, will get physical therapy for fall risk.  Will get an EEG and MRI brain. Will do swlllow sscreen and resume regular diet thereafter.  Generalized anxiety disorder chornic documented, continue with Lexapro and mirtazapine  Vascular parkinsonism Chronic diagnosis.  Continue with Sinemet.  Patient still has some tremors of her  face and tongue.  Look forward to neurology input in this regard  Type 2 diabetes mellitus with hyperglycemia Patient on glucose checks at home.  I will order insulin sliding scale here in the hospital.  Allergies C/w mentelukast.  Cetirizine changed to Claritin  Mixed hyperlipidemia Chronic, continue with Crestor and aspirin   Gabapentin is prescribed to the patient for headaches.  Indication for clonazepam prescription to the patient is not apparent.  Continue with same.   Advance Care Planning:   Code Status: Limited: Do not attempt resuscitation (DNR) -DNR-LIMITED -Do Not Intubate/DNI  discussed with daughter and patient at bedsdie.  Consults: Dr. Iver Nestle engaged. She will see patinet once patiet is at [facility]  Family Communication: daughter at bedside. Alll questions answered.  Severity of Illness: The appropriate patient status for this patient is  OBSERVATION. Observation status is judged to be reasonable and necessary in order to provide the required intensity of service to ensure the patient's safety. The patient's presenting symptoms, physical exam findings, and initial radiographic and laboratory data in the context of their medical condition is felt to place them at decreased risk for further clinical deterioration. Furthermore, it is anticipated that the patient will be medically stable for discharge from the hospital within 2 midnights of admission.   Author: Nolberto Hanlon, MD 08/17/2023 5:01 PM  For on call review [email or web address].

## 2023-08-17 NOTE — Progress Notes (Signed)
EEG complete - results pending 

## 2023-08-17 NOTE — Assessment & Plan Note (Addendum)
Patient has had seizure-like activity in the last 1 week.  However definitive diagnosis of seizures is hard to make given that patient does have some tremors that are attributable to Parkinson's disease.  Further falls have been ongoing for the patient.  And she has poor recollection of events and limited cognition at baseline.  Patient currently is at her neurologic baseline.  Will do neurochecks every 4 hourly  CT head is nonactionable  Will maintain on telemetry, will get physical therapy for fall risk.  Will get an EEG and MRI brain. Will do swlllow sscreen and resume regular diet thereafter.

## 2023-08-17 NOTE — Progress Notes (Signed)
Wrapped head during neuro assessment, LTM ordered and ativan challenge ordered. Per RN, pt possibly going to MRI. Neuro recommended MRI wait until morning. After discussing pt care needs with RN, no glue used, pt head left wrapped. Pt responding to questions appropriately and agreed she would not pull wires. Will monitor in office as schedule allows to verify electrode integrity.

## 2023-08-17 NOTE — Assessment & Plan Note (Signed)
Patient on glucose checks at home.  I will order insulin sliding scale here in the hospital.

## 2023-08-17 NOTE — ED Notes (Signed)
Patient transported to CT 

## 2023-08-17 NOTE — ED Provider Notes (Signed)
[facility] AT [facility] Provider Note   CSN: [id number] Arrival date & time: 08/17/23  1129     History  Chief Complaint  Patient presents with   Seizures    [name] is a 78 y.o. female.  HPI   78 year old female presents emergency department with possible seizure-like activity, witnessed by family member at bedside who lives with her.  They report a possible history of seizures when she was on Wellbutrin, requiring antiepileptics.  But after they took her off the Wellbutrin they weaned her from the antiepileptics and she had not had any seizure-like activity until about a week ago.  She was evaluated in the ER and sent home with a reassuring workup.  She returns today after being found on the ground next to her bed with her right arm above her head shaking with gurgling noises.  There also seems to have been a postictal period.  Patient only remembers going to bed last night to her usual time around 8 PM.  She feels fatigued with a headache but otherwise back to baseline from a neurologic standpoint.  The family member at bedside also reveals episodes of right sided facial twitching throughout the week.  Used to follow-up with neurology as an outpatient but currently has no continued care with neurology.  Home Medications Prior to Admission medications   Medication Sig Start Date End Date Taking? Authorizing Provider  ACCU-CHEK GUIDE test strip USE UP TO 4 TIMES DAILY AS DIRECTED 11/14/22   Doreene Nest, NP  Accu-Chek Softclix Lancets lancets USE UP TO 4 TIMES DAILY AS DIRECTED 07/25/22   Doreene Nest, NP  acetaminophen (TYLENOL) 500 MG tablet Take 500 mg by mouth every 6 (six) hours as needed for moderate pain (pain score 4-6) or mild pain (pain score 1-3).    [provider]  aspirin 81 MG tablet Take 81 mg by mouth daily.    [provider]  blood glucose meter kit and supplies KIT Dispense based on patient and  insurance preference. Use up to four times daily as directed. (FOR ICD-9 250.00, 250.01). 04/15/22   Doreene Nest, NP  calcium carbonate (TUMS EX) 750 MG chewable tablet Chew 1 tablet by mouth as needed.     [provider]  carbidopa-levodopa (SINEMET IR) 25-100 MG tablet TAKE 3 TABLETS BY MOUTH 3 TIMES DAILY. Patient taking differently: Take 3 tablets by mouth 3 (three) times daily. 05/24/23   Tat, Octaviano Batty, DO  cetirizine (ZYRTEC) 10 MG tablet Take 10 mg by mouth at bedtime.    [provider]  Cholecalciferol (VITAMIN D3) 1000 UNITS CAPS Take 1 capsule by mouth daily.    [provider]  clonazePAM (KLONOPIN) 0.5 MG tablet TAKE 1/2 TABLET BY MOUTH AT BEDTIME AND 1/2 DAILY AS NEEDED Patient taking differently: Take 0.25 mg by mouth See admin instructions. TAKE 1/2 TABLET BY MOUTH AT BEDTIME AND 1/2 DAILY AS NEEDED 06/20/23   Tat, Octaviano Batty, DO  donepezil (ARICEPT) 5 MG tablet TAKE 1 TABLET BY MOUTH EVERYDAY AT BEDTIME Patient taking differently: Take 5 mg by mouth at bedtime. 06/20/23   Tat, Octaviano Batty, DO  escitalopram (LEXAPRO) 10 MG tablet TAKE 1 TABLET BY MOUTH EVERY DAY Patient taking differently: Take 10 mg by mouth in the morning. 06/20/23   Tat, Octaviano Batty, DO  feeding supplement (ENSURE COMPLETE) LIQD Take 237 mLs by mouth 2 (two) times daily between meals. Patient taking differently: Take 237  mLs by mouth in the morning. 04/01/12   Alison Murray, MD  fluticasone [facility]) 50 MCG/ACT nasal spray Place 2 sprays into both nostrils daily as needed for allergies. 10/02/20   [provider]  gabapentin (NEURONTIN) 100 MG capsule TAKE 1 CAPSULE BY MOUTH THREE TIMES A DAY Patient taking differently: Take 100 mg by mouth 3 (three) times daily. 06/20/23   Tat, Octaviano Batty, DO  ibuprofen (ADVIL) 200 MG tablet Take 200 mg by mouth every 6 (six) hours as needed for fever, headache, mild pain (pain score 1-3) or moderate pain (pain score 4-6).    [provider]  memantine (NAMENDA) 10 MG tablet Take 1 tablet (10 mg total) by mouth 2 (two) times daily. 01/07/23   Tat, Octaviano Batty, DO  mirtazapine (REMERON) 30 MG tablet TAKE 1 TABLET (30 MG TOTAL) BY MOUTH AT BEDTIME. FOR SLEEP. 06/20/23   Doreene Nest, NP  montelukast (SINGULAIR) 10 MG tablet TAKE 1 TABLET (10 MG TOTAL) BY MOUTH AT BEDTIME. FOR ALLERGIES 05/01/23   Doreene Nest, NP  naproxen sodium (ALEVE) 220 MG tablet Take 220 mg by mouth daily as needed.    [provider]  rosuvastatin (CRESTOR) 5 MG tablet TAKE 1 TABLET (5 MG TOTAL) BY MOUTH EVERY EVENING. FOR CHOLESTEROL. 05/01/23   Doreene Nest, NP  simvastatin (ZOCOR) 20 MG tablet TAKE 1 TABLET BY MOUTH EVERY DAY AT BEDTIME FOR CHOLESTEROL 10/01/19 01/02/20  Doreene Nest, NP      Allergies    Codeine sulfate, Penicillins, Amoxapine and related, and Wellbutrin [bupropion]    Review of Systems   Review of Systems  Constitutional:  Positive for fatigue. Negative for fever.  Respiratory:  Negative for shortness of breath.   Cardiovascular:  Negative for chest pain.  Gastrointestinal:  Negative for abdominal pain, diarrhea and vomiting.  Skin:  Negative for rash.  Neurological:  Positive for seizures and headaches.    Physical Exam Updated Vital Signs BP (!) 181/77   Pulse 67   Temp 98.8 F (37.1 C) (Oral)   Resp 18   SpO2 97%  Physical Exam Vitals and nursing note reviewed.  Constitutional:      Appearance: Normal appearance.  HENT:     Head: Normocephalic.     Comments: Abrasions to left cheek    Mouth/Throat:     Mouth: Mucous membranes are moist.  Eyes:     Extraocular Movements: Extraocular movements intact.     Pupils: Pupils are equal, round, and reactive to light.  Neck:     Comments: C-collar in place Cardiovascular:     Rate and Rhythm: Normal rate.  Pulmonary:     Effort: Pulmonary effort is normal. No respiratory distress.  Abdominal:     Palpations: Abdomen is soft.      Tenderness: There is no abdominal tenderness.  Musculoskeletal:        General: No swelling or deformity.     Cervical back: No tenderness.  Skin:    General: Skin is warm.  Neurological:     Mental Status: She is alert and oriented to person, place, and time. Mental status is at baseline.  Psychiatric:        Mood and Affect: Mood normal.     ED Results / Procedures / Treatments   Labs (all labs ordered are listed, but only abnormal results are displayed) Labs Reviewed  CBC WITH DIFFERENTIAL/PLATELET - Abnormal; Notable for the following components:  Result Value   WBC 11.7 (*)    RBC 5.18 (*)    HCT 46.6 (*)    Neutro Abs 10.3 (*)    All other components within normal limits  COMPREHENSIVE METABOLIC PANEL - Abnormal; Notable for the following components:   Glucose, Bld 135 (*)    BUN <5 (*)    AST 10 (*)    All other components within normal limits  CBG MONITORING, ED - Abnormal; Notable for the following components:   Glucose-Capillary 126 (*)    All other components within normal limits  URINALYSIS, ROUTINE W REFLEX MICROSCOPIC  CBG MONITORING, ED    EKG EKG Interpretation Date/Time:  Wednesday August 17 2023 11:50:10 EST Ventricular Rate:  69 PR Interval:  145 QRS Duration:  95 QT Interval:  402 QTC Calculation: 431 R Axis:   -60  Text Interpretation: Sinus rhythm Abnormal R-wave progression, late transition Inferior infarct, old Confirmed by Coralee Pesa [phone]) on 08/17/2023 12:32:07 PM  Radiology CT Head Wo Contrast Result Date: 08/17/2023 CLINICAL DATA:  Seizure, new-onset, history of trauma; Polytrauma, blunt EXAM: CT HEAD WITHOUT CONTRAST CT MAXILLOFACIAL WITHOUT CONTRAST CT CERVICAL SPINE WITHOUT CONTRAST TECHNIQUE: Multidetector CT imaging of the head, cervical spine, and maxillofacial structures were performed using the standard protocol without intravenous contrast. Multiplanar CT image reconstructions of the cervical spine and maxillofacial  structures were also generated. RADIATION DOSE REDUCTION: This exam was performed according to the departmental dose-optimization program which includes automated exposure control, adjustment of the mA and/or kV according to patient size and/or use of iterative reconstruction technique. COMPARISON:  Head CT 08/08/2023 FINDINGS: CT HEAD FINDINGS Brain: No hemorrhage. No hydrocephalus. No extra-axial fluid collection. No CT evidence of an acute cortical infarct. No mass effect. No mass lesion. Generalized volume loss. Vascular: No hyperdense vessel or unexpected calcification. Skull: Normal. Negative for fracture or focal lesion. Other: None. CT MAXILLOFACIAL FINDINGS Osseous: No fracture or mandibular dislocation. No destructive process. Orbits: Negative. No traumatic or inflammatory finding. Sinuses: No middle ear or mastoid effusion. Paranasal sinuses are notable for mucosal thickening in the left maxillary sinus. Bilateral lens replacement. Orbits are otherwise unremarkable. Postsurgical changes from bilateral maxillary antrostomies. Soft tissues: Negative. CT CERVICAL SPINE FINDINGS Alignment: Normal. Skull base and vertebrae: No acute fracture. No primary bone lesion or focal pathologic process. Soft tissues and spinal canal: No prevertebral fluid or swelling. No visible canal hematoma. Disc levels:  No evidence of high-grade spinal canal stenosis Upper chest: Negative. Other: None IMPRESSION: 1. No CT evidence of intracranial injury. 2. No acute facial bone fracture. 3. No acute fracture or traumatic subluxation of the cervical spine. Electronically Signed   By: Lorenza Cambridge M.D.   On: 08/17/2023 13:41   CT Cervical Spine Wo Contrast Result Date: 08/17/2023 CLINICAL DATA:  Seizure, new-onset, history of trauma; Polytrauma, blunt EXAM: CT HEAD WITHOUT CONTRAST CT MAXILLOFACIAL WITHOUT CONTRAST CT CERVICAL SPINE WITHOUT CONTRAST TECHNIQUE: Multidetector CT imaging of the head, cervical spine, and  maxillofacial structures were performed using the standard protocol without intravenous contrast. Multiplanar CT image reconstructions of the cervical spine and maxillofacial structures were also generated. RADIATION DOSE REDUCTION: This exam was performed according to the departmental dose-optimization program which includes automated exposure control, adjustment of the mA and/or kV according to patient size and/or use of iterative reconstruction technique. COMPARISON:  Head CT 08/08/2023 FINDINGS: CT HEAD FINDINGS Brain: No hemorrhage. No hydrocephalus. No extra-axial fluid collection. No CT evidence of an acute cortical infarct. No mass effect. No  mass lesion. Generalized volume loss. Vascular: No hyperdense vessel or unexpected calcification. Skull: Normal. Negative for fracture or focal lesion. Other: None. CT MAXILLOFACIAL FINDINGS Osseous: No fracture or mandibular dislocation. No destructive process. Orbits: Negative. No traumatic or inflammatory finding. Sinuses: No middle ear or mastoid effusion. Paranasal sinuses are notable for mucosal thickening in the left maxillary sinus. Bilateral lens replacement. Orbits are otherwise unremarkable. Postsurgical changes from bilateral maxillary antrostomies. Soft tissues: Negative. CT CERVICAL SPINE FINDINGS Alignment: Normal. Skull base and vertebrae: No acute fracture. No primary bone lesion or focal pathologic process. Soft tissues and spinal canal: No prevertebral fluid or swelling. No visible canal hematoma. Disc levels:  No evidence of high-grade spinal canal stenosis Upper chest: Negative. Other: None IMPRESSION: 1. No CT evidence of intracranial injury. 2. No acute facial bone fracture. 3. No acute fracture or traumatic subluxation of the cervical spine. Electronically Signed   By: Lorenza Cambridge M.D.   On: 08/17/2023 13:41   CT Maxillofacial WO CM Result Date: 08/17/2023 CLINICAL DATA:  Seizure, new-onset, history of trauma; Polytrauma, blunt EXAM: CT HEAD  WITHOUT CONTRAST CT MAXILLOFACIAL WITHOUT CONTRAST CT CERVICAL SPINE WITHOUT CONTRAST TECHNIQUE: Multidetector CT imaging of the head, cervical spine, and maxillofacial structures were performed using the standard protocol without intravenous contrast. Multiplanar CT image reconstructions of the cervical spine and maxillofacial structures were also generated. RADIATION DOSE REDUCTION: This exam was performed according to the departmental dose-optimization program which includes automated exposure control, adjustment of the mA and/or kV according to patient size and/or use of iterative reconstruction technique. COMPARISON:  Head CT 08/08/2023 FINDINGS: CT HEAD FINDINGS Brain: No hemorrhage. No hydrocephalus. No extra-axial fluid collection. No CT evidence of an acute cortical infarct. No mass effect. No mass lesion. Generalized volume loss. Vascular: No hyperdense vessel or unexpected calcification. Skull: Normal. Negative for fracture or focal lesion. Other: None. CT MAXILLOFACIAL FINDINGS Osseous: No fracture or mandibular dislocation. No destructive process. Orbits: Negative. No traumatic or inflammatory finding. Sinuses: No middle ear or mastoid effusion. Paranasal sinuses are notable for mucosal thickening in the left maxillary sinus. Bilateral lens replacement. Orbits are otherwise unremarkable. Postsurgical changes from bilateral maxillary antrostomies. Soft tissues: Negative. CT CERVICAL SPINE FINDINGS Alignment: Normal. Skull base and vertebrae: No acute fracture. No primary bone lesion or focal pathologic process. Soft tissues and spinal canal: No prevertebral fluid or swelling. No visible canal hematoma. Disc levels:  No evidence of high-grade spinal canal stenosis Upper chest: Negative. Other: None IMPRESSION: 1. No CT evidence of intracranial injury. 2. No acute facial bone fracture. 3. No acute fracture or traumatic subluxation of the cervical spine. Electronically Signed   By: Lorenza Cambridge M.D.   On:  08/17/2023 13:41    Procedures Procedures    Medications Ordered in ED Medications - No data to display  ED Course/ Medical Decision Making/ A&P                                 Medical Decision Making Amount and/or Complexity of Data Reviewed Labs: ordered. Radiology: ordered.   78 year old female presents emergency department with witnessed seizure-like activity with a postictal period.  She had similar activity about a week ago, had a reassuring ER workup and was discharged for outpatient neurology follow-up.  Patient was hypertensive on arrival but this resolved on my initial evaluation.  She has no focal neuro finding but is confused and reportedly not back to baseline.  There is also report of ongoing facial twitching this past week at home.  Concern for ongoing seizure-like activity.  CT of the head, face and cervical spine showed no acute fracture, I was able to clear the C-spine at bedside.  Blood work is reassuring, still pending urinalysis.  Concerned that the patient has not returned back to baseline.  She continues to have staring like episodes and some tongue twitching on evaluation. Confirmed that the patient is compliant with clonazepam and gabapentin daily.  Consulted neurologist, Dr. Iver Nestle with concern for repetitive/ongoing seizure-like activity.  She recommends MRI of the brain with and without, additional blood work and admission to [facility] for further evaluation.  If no bed is available would recommend ER to ER transfer for more prompt EEG and neurology evaluation.  Patients evaluation and results requires admission for further treatment and care.  Spoke with hospitalist, reviewed patient's ED course and they accept admission.  Patient agrees with admission plan, offers no new complaints and is stable/unchanged at time of admit.        Final Clinical Impression(s) / ED Diagnoses Final diagnoses:  None    Rx / DC Orders ED Discharge Orders      None         Rozelle Logan, DO 08/17/23 1456

## 2023-08-17 NOTE — Assessment & Plan Note (Addendum)
chornic documented, continue with Lexapro and mirtazapine

## 2023-08-17 NOTE — Progress Notes (Signed)
LTM EEG running - no initial skin breakdown - push button tested - neuro notified. Per Melynda Ripple, ativan challenge conducted and button pressed when ativan delivered.

## 2023-08-17 NOTE — ED Notes (Addendum)
Contacted [facility])

## 2023-08-17 NOTE — Progress Notes (Incomplete)
Pt arrived via [facility] transport. In no acute distress. Pt resting comfortably in bed. Belong at bedside includes eyewear, sweatshirt.  No medication brought from home. Notified daughter Adela Lank about patient arriving to the unit and gave her room number.

## 2023-08-17 NOTE — Assessment & Plan Note (Signed)
Chronic, continue with Crestor and aspirin

## 2023-08-17 NOTE — Consult Note (Signed)
NEUROLOGY CONSULT NOTE   Date of service: August 17, 2023 Patient Name: [name] MRN:  [id number] DOB:  May 10, 1946 Chief Complaint: Possible seizures Requesting Provider: Nolberto Hanlon, MD  History of Present Illness  [name] is a 78 y.o. female with a PMHx of Bell's palsy in 1985, bilateral sensorineural hearing loss, DVT in the early 2000's, dysmetabolic syndrome X, dysphagia, HTN, GAD, GERD, hypersomnia, hypokalemia, mild neurocognitive disorder, HLD, orthostasis, Parkinson's disease, vascular Parkinsonism, osteoporosis, recurrent UTI, REM sleep behavior disorder, syncope and DM2 who presented to the [facility] ED today with possible seizure-like activity that was witnessed by family member. Family reports a history of possible seizures when she was on Wellbutrin, requiring antiepileptics. After they took her off the Wellbutrin, they weaned her from the antiepileptics and she had not had any seizure-like activity until about a week ago for which she was seen in the ER on 1/13. The patient on 1/13 was noted to have raised her right arm up into the air while standing and seemed to have a tremor of the right arm and was thereafter lowered by the daughter onto the ground, after which patient became sleepy. The patient regained her consciousness within a couple of minutes and was awake thereafter. She was seen in the ED, had a reassuring work up at that time and was sent home.   She was also seen at [facility] on 1/17. Triage RN note from that visit has been reviewed: "Pt was on her way to [facility] today for a visit and she had two witnessed seizures by the family. Per family they lasted like 5 seconds and was one right after the other. No urination on herself. No trauma noted to her tongue. Pt has drooping in her face but per family that is normal. Pt is alert and oriented to baseline per family as she has HX of dementia. Pt has apt with Neuro at [facility] in April. Pt takes no meds for seizures. Pt remembers entire  event. Says she saw something purple."   She returns today after being found on the ground next to her bed with her right arm above her head, shaking with gurgling noises. Per Hospitalist HPI "Patient daughter reports hearing a falling sound and rushing into the patient's bedroom at around 10 AM to find the patient on the floor with her right arm again in the air in front of her / overhead with some shaking noticed.  And the patient had her eyes closed and nonresponsive.  911 was called.  Patient woke up after about 2 minutes per daughter.  However her return to mentation took much longer including till after she got to the ER". The patient was LKN at 8 PM and found by family on the floor at 10 AM. The patient only remembered going to bed last night at around 8 PM. In the ED, she felt fatigued with a headache but otherwise was back to baseline from a neurologic standpoint. Family member also endorsed episodes of right sided facial twitching throughout the week. Previously she had been seen by Neurology as an outpatient, but currently has no continued care with Neurology.  In the [facility] ED, there was concern that the patient had not returned back to baseline. She continued to have staring like episodes and some tongue twitching on evaluation. It was confirmed that the patient is compliant with clonazepam and gabapentin daily. EDP called Neurology and transfer to [facility] was advised. It was felt best to hold off on antiseizure  medications prior to transfer for diagnostic clarity on subsequent neurology exam.    At baseline, the patient seems to have chronic gait instability, with chronic requirement of walker or wheelchair use. In spite of this patient has had several falls over the last several months.     ROS  As per HPI. The patient does not endorse any additional symptoms.    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  Really didn't use.   Weight loss 46 pounds since last visit.  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    Past Surgical History:  Procedure Laterality Date   bladder tack  11/1995   BREAST BIOPSY  05/20/2000   bilaterally   BREAST CYST ASPIRATION  12/1998   bilaterally   CARDIAC CATHETERIZATION  2004   CATARACT EXTRACTION W/ INTRAOCULAR LENS  IMPLANT, BILATERAL     FRACTURE SURGERY  02/09/2006   bilateral elbows   FRACTURE SURGERY     right knee   FRACTURE SURGERY     tib plateau   KNEE ARTHROSCOPY  12/12/2002   right    LAPAROSCOPIC GASTRIC BANDING  2008    SEPTOPLASTY  08/1998   with antral window    TONSILLECTOMY AND ADENOIDECTOMY  1970   TUBAL LIGATION  1970's    Family History: Family History  Problem Relation Age of Onset   Asthma Mother    COPD Father    Heart disease Father    Diabetes Sister    Vision loss Sister        legally blind   Hypertension Child    Hyperlipidemia Child    Diabetes Child    Diabetes Son    Breast cancer Neg Hx     Social History  reports that she has never smoked. She has never used smokeless tobacco. She reports that she does not drink alcohol and does not use drugs.  Allergies  Allergen Reactions   Codeine Sulfate  REACTION: hallucinations   Penicillins Anaphylaxis, Swelling and Rash    Tolerates Rocephin   Amoxapine And Related    Wellbutrin [Bupropion] Other (See Comments)    Seizures and hallucinations    Medications   Current Facility-Administered Medications:    acetaminophen (TYLENOL) tablet 650 mg, 650 mg, Oral, Q6H PRN **OR** acetaminophen (TYLENOL) suppository 650 mg, 650 mg, Rectal, Q6H PRN, Nolberto Hanlon, MD   aspirin EC tablet 81 mg, 81 mg, Oral, Daily, Nolberto Hanlon, MD   carbidopa-levodopa (SINEMET IR) 25-100 MG per tablet immediate release 3 tablet, 3 tablet, Oral, TID, Nolberto Hanlon, MD, 3 tablet at 08/17/23 1608   cholecalciferol (VITAMIN D3) 25 MCG (1000 UNIT) tablet 1,000 Units, 1,000 Units, Oral, Daily, Nolberto Hanlon, MD   clonazepam (KLONOPIN) disintegrating tablet 0.25 mg, 0.25 mg, Oral, BID, Nolberto Hanlon, MD, 0.25 mg at 08/17/23 1608   donepezil (ARICEPT) tablet 5 mg, 5 mg, Oral, QHS, Nolberto Hanlon, MD   Melene Muller ON 08/18/2023] enoxaparin (LOVENOX) injection 40 mg, 40 mg, Subcutaneous, Q24H, Nolberto Hanlon, MD   Melene Muller ON 08/18/2023] escitalopram (LEXAPRO) tablet 10 mg, 10 mg, Oral, q AM, Nolberto Hanlon, MD   gabapentin (NEURONTIN) capsule 100 mg, 100 mg, Oral, TID, Nolberto Hanlon, MD, 100 mg at 08/17/23 1608   insulin aspart (novoLOG) injection 0-5 Units, 0-5 Units, Subcutaneous, QHS,  Nolberto Hanlon, MD   insulin aspart (novoLOG) injection 0-9 Units, 0-9 Units, Subcutaneous, TID WC, Nolberto Hanlon, MD   loratadine (CLARITIN) tablet 10 mg, 10 mg, Oral, Daily, Nolberto Hanlon, MD   LORazepam (ATIVAN) tablet 1 mg, 1 mg, Oral, Once PRN, Nolberto Hanlon, MD   [facility]) tablet 10 mg, 10 mg, Oral, BID, Nolberto Hanlon, MD   mirtazapine (REMERON) tablet 30 mg, 30 mg, Oral, QHS, Nolberto Hanlon, MD   montelukast (SINGULAIR) tablet 10 mg, 10 mg, Oral, QHS, Nolberto Hanlon, MD   polyethylene glycol (MIRALAX / GLYCOLAX) packet 17 g, 17 g, Oral, Daily PRN, Nolberto Hanlon, MD   rosuvastatin (CRESTOR) tablet 5 mg, 5 mg, Oral, QPM, Nolberto Hanlon, MD   sodium chloride flush (NS) 0.9 % injection 3 mL, 3 mL, Intravenous, Q12H, Nolberto Hanlon, MD, 3 mL at 08/17/23 1614  Vitals   Vitals:   08/17/23 1531 08/17/23 1715 08/17/23 1845 08/17/23 1955  BP:  (!) 169/74 (!) 148/62 132/75  Pulse:  70 66 64  Resp:  19 20 17   Temp: 98.5 F (36.9 C)   97.8 F (36.6 C)  TempSrc: Oral   Oral  SpO2:  95% 96% 96%    There is no height or weight on file to calculate BMI.  Physical Exam   Physical Exam  HEENT-  [place]/AT    Lungs- Respirations unlabored Extremities- No edema  Neurological Examination Mental Status: Alert, oriented x 5, thought content appropriate. Attention and concentration intact. Speech fluent without evidence of aphasia, although she is somewhat shy and softspoken. No dysarthria.  Able to follow all commands without difficulty. Transiently during the exam, for about 1-2 minutes, she seemed to have decreased attention and also seemed to be inattentive more to left sided than right sided stimuli - no associated jerking or twitching and she was able to answer questions during the episode.   Cranial Nerves: II: Temporal visual fields intact with no extinction to DSS. PERRL  III,IV, VI: No ptosis. EOMI.  V: Temp sensation equal bilaterally  VII: Smile asymmetric, with more brisk contractions and facial  creasing on the LEFT relative to the right (the patient insists that her  prior Bell's palsy was also on the left).  VIII: Hearing intact to voice IX,X: No hoarseness XI: Symmetric shoulder shrug XII: Midline tongue extension Motor: BUE 5/5 proximally and distally BLE 5/5 proximally and distally  No pronator drift.  Sensory: Temp and light touch intact throughout, bilaterally. No extinction to DSS.  Deep Tendon Reflexes: 2+ and symmetric throughout.  Cerebellar: No ataxia with FNF bilaterally. Mild action tremor bilaterally.  Gait: Deferred   Labs/Imaging/Neurodiagnostic studies   CBC:  Recent Labs  Lab 09/10/2023 1155 09/10/23 1555  WBC 11.7* 10.8*  NEUTROABS 10.3*  --   HGB 14.7 12.3  HCT 46.6* 39.1  MCV 90.0 90.5  PLT 152 211   Basic Metabolic Panel:  Lab Results  Component Value Date   NA 139 Sep 10, 2023   K 3.6 10-Sep-2023   CO2 26 Sep 10, 2023   GLUCOSE 135 (H) 09/10/23   BUN <5 (L) 09/10/2023   CREATININE 0.65 09-10-23   CALCIUM 9.6 09-10-2023   GFRNONAA >60 2023/09/10   GFRAA >60 06/13/2016   Lipid Panel:  Lab Results  Component Value Date   LDLCALC 34 04/26/2023   HgbA1c:  Lab Results  Component Value Date   HGBA1C 7.2 (H) 04/26/2023   Urine Drug Screen:     Component Value Date/Time   LABOPIA NONE DETECTED 08/08/2023 1242   COCAINSCRNUR NONE DETECTED 08/08/2023 1242   LABBENZ NONE DETECTED 08/08/2023 1242   AMPHETMU NONE DETECTED 08/08/2023 1242   THCU NONE DETECTED 08/08/2023 1242   LABBARB NONE DETECTED 08/08/2023 1242    Alcohol Level     Component Value Date/Time   ETH <10 08/08/2023 1045   INR  Lab Results  Component Value Date   INR 1.0 01/12/2021   APTT  Lab Results  Component Value Date   APTT 22 (L) 01/12/2021     ASSESSMENT  78 year old female presenting with new onset of recurrent spells that have a semiology suspicious for seizures.  - Exam reveals one episode of decreased attentiveness lasting for about 2 minutes,  during which she attended less to left sided than right sided stimuli. No jerking, twitching or other abnormal motor activity noted.  - CT head, C-spine, Maxillofacial reports reviewed: No CT evidence of intracranial injury. No acute facial bone fracture. No acute fracture or traumatic subluxation of the cervical spine. 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  - Possible unwitnessed initial stages of a GTC seizure at home - Possible atypical presentation of cardiac syncopal spells  RECOMMENDATIONS  - LTM EEG (ordered) - MRI brain with and without contrast - Cardiac telemetry - Cardiology consult - Inpatient seizure precautions - Holding off on anticonvulsant initiation at this time.    Addendum:  Initial LTM EEG tracings showed continuous slowing with excessive beta activity that appeared to be unusually sharply contoured. Due to this, an Ativan challenge with 2 mg IV x 1 was initiated. After the Ativan challenge, she fell asleep, with no seizure activity seen on LTM.   ______________________________________________________________________    Dessa Phi, Cortavius Montesinos, MD [facility]

## 2023-08-17 NOTE — Progress Notes (Signed)
Dr. Otelia Limes in the room, asked care nurse to pull 2mg  of Ativan IV to give as trial challenge after patient is hooked up to EEG machine.

## 2023-08-17 NOTE — ED Triage Notes (Signed)
Pt BIBA from home for suspected seizure activity and fall sometime between 10pm and 8am this am.  Per EMS family found patient on floor this morning.

## 2023-08-17 NOTE — Assessment & Plan Note (Addendum)
C/w mentelukast.  Cetirizine changed to Claritin

## 2023-08-18 ENCOUNTER — Other Ambulatory Visit: Payer: Self-pay

## 2023-08-18 ENCOUNTER — Encounter (HOSPITAL_COMMUNITY): Payer: Self-pay | Admitting: Internal Medicine

## 2023-08-18 ENCOUNTER — Inpatient Hospital Stay (HOSPITAL_COMMUNITY): Payer: Medicare Other

## 2023-08-18 DIAGNOSIS — R569 Unspecified convulsions: Secondary | ICD-10-CM | POA: Diagnosis not present

## 2023-08-18 LAB — BASIC METABOLIC PANEL
Anion gap: 10 (ref 5–15)
BUN: 11 mg/dL (ref 8–23)
CO2: 27 mmol/L (ref 22–32)
Calcium: 9 mg/dL (ref 8.9–10.3)
Chloride: 100 mmol/L (ref 98–111)
Creatinine, Ser: 0.67 mg/dL (ref 0.44–1.00)
GFR, Estimated: >60 mL/min (ref >60)
Glucose, Bld: 97 mg/dL (ref 70–99)
Potassium: 3.2 mmol/L — ABNORMAL LOW (ref 3.5–5.1)
Sodium: 137 mmol/L (ref 135–145)

## 2023-08-18 LAB — URINALYSIS, ROUTINE W REFLEX MICROSCOPIC
Bilirubin Urine: NEGATIVE
Glucose, UA: NEGATIVE mg/dL
Hgb urine dipstick: NEGATIVE
Ketones, ur: 20 mg/dL — AB
Nitrite: NEGATIVE
Protein, ur: NEGATIVE mg/dL
Specific Gravity, Urine: 1.018 (ref 1.005–1.030)
pH: 6 (ref 5.0–8.0)

## 2023-08-18 LAB — PROTIME-INR
INR: 1.1 (ref 0.8–1.2)
Prothrombin Time: 14.5 s (ref 11.4–15.2)

## 2023-08-18 LAB — CBC
HCT: 35.7 % — ABNORMAL LOW (ref 36.0–46.0)
Hemoglobin: 11.6 g/dL — ABNORMAL LOW (ref 12.0–15.0)
MCH: 29.1 pg (ref 26.0–34.0)
MCHC: 32.5 g/dL (ref 30.0–36.0)
MCV: 89.5 fL (ref 80.0–100.0)
Platelets: 175 10*3/uL (ref 150–400)
RBC: 3.99 MIL/uL (ref 3.87–5.11)
RDW: 14 % (ref 11.5–15.5)
WBC: 8.4 10*3/uL (ref 4.0–10.5)
nRBC: 0 % (ref 0.0–0.2)

## 2023-08-18 LAB — MRSA NEXT GEN BY PCR, NASAL: MRSA by PCR Next Gen: NOT DETECTED

## 2023-08-18 LAB — GLUCOSE, CAPILLARY: Glucose-Capillary: 98 mg/dL (ref 70–99)

## 2023-08-18 LAB — APTT: aPTT: 28 s (ref 24–36)

## 2023-08-18 MED ORDER — GADOBUTROL 1 MMOL/ML IV SOLN
6.5000 mL | Freq: Once | INTRAVENOUS | Status: AC | PRN
  Administered 2023-08-18: 6.5 mL via INTRAVENOUS

## 2023-08-18 MED ORDER — LEVETIRACETAM 250 MG PO TABS
250.0000 mg | ORAL_TABLET | Freq: Two times a day (BID) | ORAL | Status: DC
  Administered 2023-08-18 – 2023-08-19 (×3): 250 mg via ORAL
  Filled 2023-08-18 (×3): qty 1

## 2023-08-18 MED ORDER — LORAZEPAM 2 MG/ML IJ SOLN: 2.0000 mg | INTRAMUSCULAR | Status: DC | PRN

## 2023-08-18 MED ORDER — POTASSIUM CHLORIDE CRYS ER 20 MEQ PO TBCR
40.0000 meq | EXTENDED_RELEASE_TABLET | Freq: Once | ORAL | Status: AC
  Administered 2023-08-18: 40 meq via ORAL
  Filled 2023-08-18: qty 2

## 2023-08-18 MED ORDER — POTASSIUM CHLORIDE 10 MEQ/100ML IV SOLN
10.0000 meq | INTRAVENOUS | Status: DC
  Administered 2023-08-18 (×2): 10 meq via INTRAVENOUS
  Filled 2023-08-18 (×2): qty 100

## 2023-08-18 MED ORDER — ORAL CARE MOUTH RINSE: 15.0000 mL | OROMUCOSAL | Status: DC | PRN

## 2023-08-18 NOTE — Progress Notes (Signed)
[facility] Progress Note Patient: [name] FAO:[id number] DOB: 1946/02/03 DOA: 08/17/2023  DOS: the patient was seen and examined on 08/18/2023  Brief Hospital Course: PMH of dementia, Parkinson disease, HTN, GERD.  Present to the hospital with complaints of seizures. Neurology was consulted. Started on Keppra now.  Assessment and Plan: Seizures (HCC) Patient has had seizure-like activity in the last 1 week.  Patient currently is at her neurologic baseline. CT head is nonactionable MRI brain with and without contrast currently pending. Neurology consulted. Initiated on Keppra. Speech therapy was consulted.  Currently on dysphagia diet.   Generalized anxiety disorder chornic documented, continue with Lexapro and mirtazapine   Vascular parkinsonism Chronic diagnosis.  Continue with Sinemet.  Patient still has some tremors of her face and tongue.   Type 2 diabetes mellitus with hyperglycemia On sliding scale insulin.   Subjective: No nausea no vomiting no fever no chills.  Mentation improving.  Physical Exam: General: in Mild distress, No Rash Cardiovascular: S1 and S2 Present, No Murmur Respiratory: Good respiratory effort, Bilateral Air entry present. No Crackles, No wheezes Abdomen: Bowel Sound present, No tenderness Extremities: No edema Neuro: Alert and oriented x3, no new focal deficit  Data Reviewed: I have Reviewed nursing notes, Vitals, and Lab results. Since last encounter, pertinent lab results CBC and BMP   . I have ordered test including CBC and BMP  . I have discussed pt's care plan and test results with neurology  .  Disposition: Status is: Inpatient Remains inpatient appropriate because: Monitor for improvement in mentation.  enoxaparin (LOVENOX) injection 40 mg Start: 08/18/23 0800 SCDs Start: 08/17/23 1514   Family Communication: No one at bedside Level of care: Progressive   Vitals:   08/18/23 0355 08/18/23 0818 08/18/23 1249 08/18/23  1531  BP: 139/70 (!) 148/63 (!) 149/85 119/67  Pulse: 64 66 80 68  Resp: 16 16 17 19   Temp: 98.6 F (37 C) 98.7 F (37.1 C)  98.5 F (36.9 C)  TempSrc: Oral   Oral  SpO2: 92% 99% 96% 95%  Weight:      Height:         Author: Lynden Oxford, MD 08/18/2023 7:05 PM  Please look on [email or web address] to find out who is on call.

## 2023-08-18 NOTE — Procedures (Addendum)
Patient Name: [name]  MRN: [id number]  Epilepsy Attending: Charlsie Quest  Referring Physician/Provider: Caryl Pina, MD  Duration: 08/17/2023 2111 to 08/18/2023 8657   Patient history: 78yo F with ams getting eeg to evaluate for seizure   Level of alertness: Awake, asleep   AEDs during EEG study: Ativan   Technical aspects: This EEG study was done with scalp electrodes positioned according to the 10-20 International system of electrode placement. Electrical activity was reviewed with band pass filter of 1-70Hz , sensitivity of 7 uV/mm, display speed of 26mm/sec with a 60Hz  notched filter applied as appropriate. EEG data were recorded continuously and digitally stored.  Video monitoring was available and reviewed as appropriate.   Description: The posterior dominant rhythm consists of 8-9 Hz activity of moderate voltage (25-35 uV) seen predominantly in posterior head regions, symmetric and reactive to eye opening and eye closing.  IV Ativan was administered on 08/17/2023 at around 2116.  Subsequently patient fell asleep.  Sleep was characterized by vertex waves, sleep symptoms (12 to 14 hours), maximal frontocentral region).  EEG also showed continuous generalized 3-5 Hz theta-delta slowing admixed within excessive amount of sharply contoured 15 to 18 Hz beta activity with irregular morphology distributed symmetrically and diffusely.  Hyperventilation and photic stimulation were not performed.     Of note, EEG was difficult to interpret between 08/18/2023 0535 to 0756 due to significant electrode artifact.   ABNORMALITY - Continuous slow, generalized - Excessive beta, generalized   IMPRESSION: This study is suggestive of mild to moderate diffuse encephalopathy. The excessive beta activity seen in the background is most likely due to the effect of benzodiazepine use.   No seizures or epileptiform discharges were seen during the study.   [name]

## 2023-08-18 NOTE — Procedures (Addendum)
Patient Name: [name]  MRN: [id number]  Epilepsy Attending: Charlsie Quest  Referring Physician/Provider: Nolberto Hanlon, MD  Date: 08/17/2023 Duration: 23.37 mins  Patient history: 78yo F with ams getting eeg to evaluate for seizure  Level of alertness: Awake  AEDs during EEG study: None  Technical aspects: This EEG study was done with scalp electrodes positioned according to the 10-20 International system of electrode placement. Electrical activity was reviewed with band pass filter of 1-70Hz , sensitivity of 7 uV/mm, display speed of 35mm/sec with a 60Hz  notched filter applied as appropriate. EEG data were recorded continuously and digitally stored.  Video monitoring was available and reviewed as appropriate.  Description: The posterior dominant rhythm consists of 8-9 Hz activity of moderate voltage (25-35 uV) seen predominantly in posterior head regions, symmetric and reactive to eye opening and eye closing.  EEG also showed continuous generalized 3-5Hz  theta- delta slowing admixed within excessive amount of sharply contoured 15 to 18 Hz beta activity with irregular morphology distributed symmetrically and diffusely. Hyperventilation and photic stimulation were not performed.     ABNORMALITY - Continuous slow, generalized - Excessive beta, generalized  IMPRESSION: This study is suggestive of mild to moderate diffuse encephalopathy. The excessive beta activity seen in the background is most likely due to the effect of benzodiazepine use.  However the morphology of beta activity is sharply contoured.  Therefore, if patient is not at baseline, can consider Ativan challenge and long-term monitoring for further evaluation of the seizure pattern.  Dr. Otelia Limes was notified.  Esaias Cleavenger Annabelle Harman

## 2023-08-18 NOTE — Plan of Care (Signed)
  Problem: Education: Goal: Ability to describe self-care measures that may prevent or decrease complications (Diabetes Survival Skills Education) will improve Outcome: Progressing   Problem: Fluid Volume: Goal: Ability to maintain a balanced intake and output will improve Outcome: Progressing   Problem: Metabolic: Goal: Ability to maintain appropriate glucose levels will improve Outcome: Progressing   Problem: Skin Integrity: Goal: Risk for impaired skin integrity will decrease Outcome: Progressing   Problem: Elimination: Goal: Will not experience complications related to bowel motility Outcome: Progressing   Problem: Safety: Goal: Ability to remain free from injury will improve Outcome: Progressing

## 2023-08-18 NOTE — Progress Notes (Signed)
Subjective: No acute events overnight.  Denies any further seizure-like activity.  ROS: negative except above  Examination  Vital signs in last 24 hours: Temp:  [97.8 F (36.6 C)-98.8 F (37.1 C)] 98.6 F (37 C) (01/23 0355) Pulse Rate:  [64-70] 66 (01/23 0818) Resp:  [16-23] 16 (01/23 0818) BP: (132-206)/(62-93) 148/63 (01/23 0818) SpO2:  [92 %-99 %] 99 % (01/23 0818) Weight:  [63.3 kg] 63.3 kg (01/22 1955)  General: lying in bed, NAD  Neuro: MS: Alert, oriented, follows commands CN: pupils equal and reactive,  EOMI, face symmetric, tongue midline, normal sensation over face Motor: 5/5 strength in all 4 extremities Coordination: normal Gait: not tested  Basic Metabolic Panel: Recent Labs  Lab 08/12/23 1504 08/17/23 1155 08/17/23 1555 08/18/23 0513  NA 140 139  --  137  K 3.4* 3.6  --  3.2*  CL 102 100  --  100  CO2 28 26  --  27  GLUCOSE 208* 135*  --  97  BUN 17 <5*  --  11  CREATININE 0.56 QUANTITY NOT SUFFICIENT, UNABLE TO PERFORM TEST 0.65 0.67  CALCIUM 9.0 9.6  --  9.0  MG  --   --  2.1  --     CBC: Recent Labs  Lab 08/12/23 1504 08/17/23 1155 08/17/23 1555 08/18/23 0513  WBC 9.1 11.7* 10.8* 8.4  NEUTROABS  --  10.3*  --   --   HGB 12.9 14.7 12.3 11.6*  HCT 40.2 46.6* 39.1 35.7*  MCV 91.4 90.0 90.5 89.5  PLT 217 152 211 175     Coagulation Studies: Recent Labs    08/18/23 0513  LABPROT 14.5  INR 1.1    Imaging personally reviewed  CT head without contrast 08/17/2023: No CT evidence of intracranial injury.   ASSESSMENT AND PLAN: 78 year old female with history of dementia who has had 3 presentations with seizure-like activity.  Per description has also had some right facial twitching.  Prior to seizure patient reported "seeing purple".  Family noticed right arm elevation followed by generalized tonic-clonic seizure, no incontinence, tongue biting.  Of note, patient is on clonazepam at home but UDS on 08/08/2023 was negative for  benzos  Seizures -The first seizure could have been in the setting of benzodiazepine withdrawal due to negative UDS even though patient states she has been compliant with medications.  However patient has had 4 seizures since and they appear to have focal onset. -EEG did not show any ictal-interictal abnormality.  However due to multiple seizures, I would recommend starting Keppra 250 mg twice daily -Continue clonazepam and gabapentin (home medications) -Will obtain MRI brain with and without contrast to look for any acute abnormality -Seizure precautions -As needed IV Ativan for clinical seizures -Discussed plan with Dr. Allena Katz via secure chat  I have spent a total of   36 minutes with the patient reviewing hospital notes,  test results, labs and examining the patient as well as establishing an assessment and plan that was discussed personally with the patient.  > 50% of time was spent in direct patient care.         Lindie Spruce Epilepsy [facility] For questions after 5pm please refer to AMION to reach the Neurologist on call

## 2023-08-18 NOTE — Hospital Course (Signed)
PMH of dementia, Parkinson disease, HTN, GERD.  Present to the hospital with complaints of seizures. Neurology was consulted. Started on Keppra now.  Assessment and Plan: Seizures (HCC) Patient has had seizure-like activity in the last 1 week.  Patient currently is at her neurologic baseline. CT head is nonactionable MRI brain with and without contrast currently pending. Neurology consulted. Initiated on Keppra. Speech therapy was consulted.  Currently on dysphagia diet.   Generalized anxiety disorder chornic documented, continue with Lexapro and mirtazapine   Vascular parkinsonism Chronic diagnosis.  Continue with Sinemet.  Patient still has some tremors of her face and tongue.   Type 2 diabetes mellitus with hyperglycemia On sliding scale insulin.

## 2023-08-18 NOTE — Evaluation (Addendum)
Physical Therapy Evaluation Patient Details Name: [name] MRN: [id number] DOB: [birth year] Today's Date: 08/18/2023  History of Present Illness  78 y.o. female presented 08/17/23 via EMS s/p ?seizures. PMH significant of dementia, Parkinson disease, DM, chronic gait instability, recent falls, Bell's palsy, hearing loss, DVT, HTN, obesity, orthostasis, osteoporosis.  Clinical Impression   Pt admitted secondary to problem above with deficits below. PTA patient was at home with daughter living with her. She has had multiple recent falls (?in part due to seizures).  Pt currently requires min assist for bed mobility, transfers, and gait.  Anticipate patient will benefit from PT to address problems listed below.Will continue to follow acutely to maximize functional mobility independence and safety.  Patient could benefit from HHPT.          If plan is discharge home, recommend the following: A little help with walking and/or transfers;A little help with bathing/dressing/bathroom;Assistance with cooking/housework;Direct supervision/assist for medications management;Direct supervision/assist for financial management;Assist for transportation;Help with stairs or ramp for entrance;Supervision due to cognitive status   Can travel by private vehicle        Equipment Recommendations None recommended by PT  Recommendations for Other Services       Functional Status Assessment Patient has had a recent decline in their functional status and/or demonstrates limited ability to make significant improvements in function in a reasonable and predictable amount of time     Precautions / Restrictions Precautions Precautions: Fall Restrictions Weight Bearing Restrictions Per Provider Order: No      Mobility  Bed Mobility Overal bed mobility: Needs Assistance Bed Mobility: Rolling, Sidelying to Sit, Sit to Supine Rolling: Supervision Sidelying to sit: Min assist   Sit to supine: Supervision    General bed mobility comments: pt required assist to fully raise torso and get her balance    Transfers Overall transfer level: Needs assistance Equipment used: Rolling walker (2 wheels) Transfers: Sit to/from Stand Sit to Stand: Min assist           General transfer comment: pt pulls up on RW to come to stand; repeated x2    Ambulation/Gait Ambulation/Gait assistance: Min assist, Contact guard assist Gait Distance (Feet): 80 Feet Assistive device: Rolling walker (2 wheels) Gait Pattern/deviations: Step-through pattern, Decreased stride length, Shuffle   Gait velocity interpretation: 1.31 - 2.62 ft/sec, indicative of limited community ambulator   General Gait Details: required assist to fully turn RW 180 degrees to return to room  Stairs            Wheelchair Mobility     Tilt Bed    Modified Rankin (Stroke Patients Only)       Balance Overall balance assessment: Needs assistance Sitting-balance support: No upper extremity supported, Feet supported Sitting balance-Leahy Scale: Fair     Standing balance support: Bilateral upper extremity supported Standing balance-Leahy Scale: Poor                               Pertinent Vitals/Pain Pain Assessment Pain Assessment: No/denies pain    Home Living Family/patient expects to be discharged to:: Private residence Living Arrangements: Children (Daughter) Available Help at Discharge: Family;Available 24 hours/day Type of Home: House Home Access: Stairs to enter Entrance Stairs-Rails: Doctor, general practice of Steps: 2   Home Layout: One level Home Equipment: Agricultural consultant (2 wheels);Cane - single point;Shower seat;Wheelchair - manual Additional Comments: pt unable to provide information (taken from recent medical record)  Prior Function Prior Level of Function : Needs assist             Mobility Comments: uses SPC or RW ADLs Comments: Patient states daughter assists as  needed,  Daughter completes iADL, meals, meds and community mobility.     Extremity/Trunk Assessment   Upper Extremity Assessment Upper Extremity Assessment: Generalized weakness    Lower Extremity Assessment Lower Extremity Assessment: Generalized weakness    Cervical / Trunk Assessment Cervical / Trunk Assessment: Normal  Communication   Communication Communication: No apparent difficulties  Cognition Arousal: Alert Behavior During Therapy: WFL for tasks assessed/performed Overall Cognitive Status: History of cognitive impairments - at baseline                                          General Comments      Exercises     Assessment/Plan    PT Assessment Patient needs continued PT services  PT Problem List Decreased strength;Decreased balance;Decreased mobility;Decreased cognition;Decreased knowledge of use of DME;Decreased safety awareness       PT Treatment Interventions DME instruction;Gait training;Stair training;Functional mobility training;Therapeutic activities;Therapeutic exercise;Balance training;Neuromuscular re-education;Cognitive remediation;Patient/family education    PT Goals (Current goals can be found in the Care Plan section)  Acute Rehab PT Goals Patient Stated Goal: none stated PT Goal Formulation: Patient unable to participate in goal setting Time For Goal Achievement: 09/01/23 Potential to Achieve Goals: Good    Frequency Min 1X/week     Co-evaluation               AM-PAC PT "6 Clicks" Mobility  Outcome Measure Help needed turning from your back to your side while in a flat bed without using bedrails?: A Little Help needed moving from lying on your back to sitting on the side of a flat bed without using bedrails?: A Little Help needed moving to and from a bed to a chair (including a wheelchair)?: A Little Help needed standing up from a chair using your arms (e.g., wheelchair or bedside chair)?: A Little Help needed  to walk in hospital room?: A Little Help needed climbing 3-5 steps with a railing? : A Little 6 Click Score: 18    End of Session Equipment Utilized During Treatment: Gait belt Activity Tolerance: Patient tolerated treatment well Patient left: in bed;with call bell/phone within reach;with bed alarm set;Other (comment) (seizure pads) Nurse Communication: Mobility status PT Visit Diagnosis: Unsteadiness on feet (R26.81);Other abnormalities of gait and mobility (R26.89);Repeated falls (R29.6)    Time: 1310-1335 PT Time Calculation (min) (ACUTE ONLY): 25 min   Charges:   PT Evaluation $PT Eval Low Complexity: 1 Low PT Treatments $Gait Training: 8-22 mins PT General Charges $$ ACUTE PT VISIT: 1 Visit          Jerolyn Center, PT Acute Rehabilitation Services  Office [phone]   Zena Amos 08/18/2023, 1:55 PM

## 2023-08-18 NOTE — Evaluation (Signed)
Clinical/Bedside Swallow Evaluation Patient Details  Name: [name] MRN: [id number] Date of Birth: [birth year]  Today's Date: 08/18/2023 Time: SLP Start Time (ACUTE ONLY): 0017 SLP Stop Time (ACUTE ONLY): 1006 SLP Time Calculation (min) (ACUTE ONLY): 18 min  Past Medical History:  Past Medical History:  Diagnosis Date   Acute cystitis without hematuria 01/15/2022   Acute facial pain 07/05/2022   Acute metabolic encephalopathy 07/19/2023   Agnosia 04/07/2011   Bell's palsy    1985   Bilateral sensorineural hearing loss 05/03/2023   Constipation 07/19/2023   Cramping of hands 03/23/2017   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    "can't do either"   DVT (deep venous thrombosis), left "early 2000's"   RLE   Dysmetabolic syndrome X    Dysphagia 12/07/2011   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  Really didn't use.   Weight loss 46 pounds since last visit.  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   Past Surgical History:  Past Surgical History:  Procedure Laterality  Date   bladder tack  11/1995   BREAST BIOPSY  05/20/2000   bilaterally   BREAST CYST ASPIRATION  12/1998   bilaterally   CARDIAC CATHETERIZATION  2004   CATARACT EXTRACTION W/ INTRAOCULAR LENS  IMPLANT, BILATERAL     FRACTURE SURGERY  02/09/2006   bilateral elbows   FRACTURE SURGERY     right knee   FRACTURE SURGERY     tib plateau   KNEE ARTHROSCOPY  12/12/2002   right    LAPAROSCOPIC GASTRIC BANDING  2008   SEPTOPLASTY  08/1998   with antral window    TONSILLECTOMY AND ADENOIDECTOMY  1970   TUBAL LIGATION  1970's   HPI:  [name] is a 78 yo female presenting to ED 1/22 after possible seizure-like activity. EEG shows mild to moderate diffuse encephalopathy. MBS 2013 showed normal oropharyngeal swallow function. PMH includes Bell's palsy, bilateral sensorineural hearing loss, DVT, dysmetabolic syndrome, dysphagia, HTN, GAD, GERD, hypersomnia, hypokalemia, mild neurocognitive disorder, HLD, orthostasis, Parkinson's disease, syncope, T2DM    Assessment / Plan / Recommendation  Clinical Impression  Pt is oriented x2, but pleasantly confused to overall situation. She followed all commands to complete an oral motor exam, which was [facility]. She has adequate lower dentition and typically wears upper dentures, which are currently unavailable. This is suspected to contribute to difficulty masticating regular solids, although she achieves adequate oral clearance given a liquid wash. Thin liquids and purees were observed without any s/s of aspiration.  SLP called pt's daughter and asked that pt's dentures be brought in as able. Given mentation and dentition, recommend initiating diet of Dys 3 solids and thin liquids. Meds can be given with liquid. No SLP f/u is needed, will s/o. SLP Visit Diagnosis: Dysphagia, unspecified (R13.10)    Aspiration Risk  Mild aspiration risk    Diet Recommendation Dysphagia 3 (Mech soft);Thin liquid    Liquid Administration via: Cup;Straw Medication  Administration: Whole meds with liquid Supervision: Patient able to self feed;Intermittent supervision to cue for compensatory strategies Compensations: Minimize environmental distractions;Slow rate;Small sips/bites Postural Changes: Seated upright at 90 degrees;Remain upright for at least 30 minutes after po intake    Other  Recommendations Oral Care Recommendations: Oral care BID    Recommendations for follow up therapy are one component of a multi-disciplinary discharge planning process, led by the attending physician.  Recommendations may be updated based on patient status, additional functional criteria and insurance authorization.  Follow up Recommendations No SLP follow up      Assistance Recommended at Discharge    Functional Status Assessment Patient has not had a recent decline in their functional status  Frequency and Duration            Prognosis Prognosis for improved oropharyngeal function: Good Barriers to Reach Goals: Cognitive deficits      Swallow Study   General HPI: [name] is a 78 yo female presenting to ED 1/22 after possible seizure-like activity. EEG shows mild to moderate diffuse encephalopathy. MBS 2013 showed normal oropharyngeal swallow function. PMH includes Bell's palsy, bilateral sensorineural hearing loss, DVT, dysmetabolic syndrome, dysphagia, HTN, GAD, GERD, hypersomnia, hypokalemia, mild neurocognitive disorder, HLD, orthostasis, Parkinson's disease, syncope, T2DM Type of Study: Bedside Swallow Evaluation Previous Swallow Assessment: see HPI Diet Prior to this Study: NPO Temperature Spikes Noted: No Respiratory Status: Room air History of Recent Intubation: No Behavior/Cognition: Alert;Cooperative;Requires cueing Oral Cavity Assessment: Within Functional Limits Oral Care Completed by SLP: No Oral Cavity - Dentition: Dentures, top;Dentures, not available Vision: Functional for self-feeding Self-Feeding Abilities: Able to feed  self Patient Positioning: Upright in bed Baseline Vocal Quality: Normal Volitional Cough: Strong Volitional Swallow: Able to elicit    Oral/Motor/Sensory Function Overall Oral Motor/Sensory Function: Within functional limits   Ice Chips Ice chips: Not tested   Thin Liquid Thin Liquid: Within functional limits Presentation: Straw;Self Fed    Nectar Thick Nectar Thick Liquid: Not tested   Honey Thick Honey Thick Liquid: Not tested   Puree Puree: Within functional limits Presentation: Spoon;Self Fed   Solid     Solid: Impaired Presentation: Self Fed Oral Phase Functional Implications: Oral residue;Impaired mastication      Gwynneth Aliment, M.A., CF-SLP Speech Language Pathology, Acute Rehabilitation Services  Secure Chat preferred [phone]  08/18/2023,10:25 AM

## 2023-08-18 NOTE — Progress Notes (Signed)
LTM EEG discontinued - no skin breakdown at unhook.   

## 2023-08-19 ENCOUNTER — Other Ambulatory Visit (HOSPITAL_COMMUNITY): Payer: Self-pay

## 2023-08-19 DIAGNOSIS — R569 Unspecified convulsions: Secondary | ICD-10-CM | POA: Diagnosis not present

## 2023-08-19 LAB — BASIC METABOLIC PANEL
Anion gap: 10 (ref 5–15)
BUN: 14 mg/dL (ref 8–23)
CO2: 27 mmol/L (ref 22–32)
Calcium: 9.1 mg/dL (ref 8.9–10.3)
Chloride: 101 mmol/L (ref 98–111)
Creatinine, Ser: 0.68 mg/dL (ref 0.44–1.00)
GFR, Estimated: >60 mL/min (ref >60)
Glucose, Bld: 94 mg/dL (ref 70–99)
Potassium: 3.9 mmol/L (ref 3.5–5.1)
Sodium: 138 mmol/L (ref 135–145)

## 2023-08-19 LAB — CBC WITH DIFFERENTIAL/PLATELET
Abs Immature Granulocytes: 0.03 10*3/uL (ref 0.00–0.07)
Basophils Absolute: 0 10*3/uL (ref 0.0–0.1)
Basophils Relative: 1 %
Eosinophils Absolute: 0.2 10*3/uL (ref 0.0–0.5)
Eosinophils Relative: 3 %
HCT: 36.1 % (ref 36.0–46.0)
Hemoglobin: 11.8 g/dL — ABNORMAL LOW (ref 12.0–15.0)
Immature Granulocytes: 0 %
Lymphocytes Relative: 12 %
Lymphs Abs: 1 10*3/uL (ref 0.7–4.0)
MCH: 28.9 pg (ref 26.0–34.0)
MCHC: 32.7 g/dL (ref 30.0–36.0)
MCV: 88.3 fL (ref 80.0–100.0)
Monocytes Absolute: 0.6 10*3/uL (ref 0.1–1.0)
Monocytes Relative: 7 %
Neutro Abs: 6.4 10*3/uL (ref 1.7–7.7)
Neutrophils Relative %: 77 %
Platelets: 181 10*3/uL (ref 150–400)
RBC: 4.09 MIL/uL (ref 3.87–5.11)
RDW: 13.8 % (ref 11.5–15.5)
WBC: 8.2 10*3/uL (ref 4.0–10.5)
nRBC: 0 % (ref 0.0–0.2)

## 2023-08-19 MED ORDER — LEVETIRACETAM 250 MG PO TABS
250.0000 mg | ORAL_TABLET | Freq: Two times a day (BID) | ORAL | 0 refills | Status: DC
  Filled 2023-08-19: qty 60, 30d supply, fill #0

## 2023-08-19 MED ORDER — HALOPERIDOL LACTATE 5 MG/ML IJ SOLN
5.0000 mg | Freq: Once | INTRAMUSCULAR | Status: AC | PRN
  Administered 2023-08-19: 5 mg via INTRAVENOUS
  Filled 2023-08-19: qty 1

## 2023-08-19 NOTE — Plan of Care (Signed)
No acute changes. Mentation same at AxOx3. No seizure noted on shift   Problem: Education: Goal: Ability to describe self-care measures that may prevent or decrease complications (Diabetes Survival Skills Education) will improve 08/19/2023 1146 by Ileana Roup, RN Outcome: Not Met (add Reason) 08/19/2023 1143 by Ileana Roup, RN Outcome: Not Met (add Reason) 08/19/2023 1143 by Ileana Roup, RN Outcome: Not Met (add Reason) 08/19/2023 1141 by Ileana Roup, RN Outcome: Not Met (add Reason) Goal: Individualized Educational Video(s) 08/19/2023 1146 by Ileana Roup, RN Outcome: Not Met (add Reason) 08/19/2023 1143 by Ileana Roup, RN Outcome: Not Met (add Reason) 08/19/2023 1143 by Ileana Roup, RN Outcome: Not Met (add Reason) 08/19/2023 1141 by Ileana Roup, RN Outcome: Not Met (add Reason)   Problem: Fluid Volume: Goal: Ability to maintain a balanced intake and output will improve 08/19/2023 1146 by Ileana Roup, RN Outcome: Not Met (add Reason) 08/19/2023 1143 by Ileana Roup, RN Outcome: Not Met (add Reason) 08/19/2023 1143 by Ileana Roup, RN Outcome: Not Met (add Reason) 08/19/2023 1141 by Ileana Roup, RN Outcome: Not Met (add Reason)   Problem: Coping: Goal: Ability to adjust to condition or change in health will improve 08/19/2023 1146 by Ileana Roup, RN Outcome: Not Met (add Reason) 08/19/2023 1143 by Ileana Roup, RN Outcome: Not Met (add Reason) 08/19/2023 1143 by Ileana Roup, RN Outcome: Not Met (add Reason) 08/19/2023 1141 by Ileana Roup, RN Outcome: Not Met (add Reason)   Problem: Nutritional: Goal: Maintenance of adequate nutrition will improve 08/19/2023 1146 by Ileana Roup, RN Outcome: Not Met (add Reason) 08/19/2023 1143 by Ileana Roup, RN Outcome: Not Met (add Reason) 08/19/2023 1143 by Ileana Roup, RN Outcome: Not Met (add Reason) 08/19/2023 1141 by  Ileana Roup, RN Outcome: Not Met (add Reason) Goal: Progress toward achieving an optimal weight will improve 08/19/2023 1146 by Ileana Roup, RN Outcome: Not Met (add Reason) 08/19/2023 1143 by Ileana Roup, RN Outcome: Not Met (add Reason) 08/19/2023 1143 by Ileana Roup, RN Outcome: Not Met (add Reason) 08/19/2023 1141 by Ileana Roup, RN Outcome: Not Met (add Reason)   Problem: Skin Integrity: Goal: Risk for impaired skin integrity will decrease 08/19/2023 1146 by Ileana Roup, RN Outcome: Not Met (add Reason) 08/19/2023 1143 by Ileana Roup, RN Outcome: Not Met (add Reason) 08/19/2023 1143 by Ileana Roup, RN Outcome: Not Met (add Reason) 08/19/2023 1141 by Ileana Roup, RN Outcome: Not Met (add Reason)   Problem: Tissue Perfusion: Goal: Adequacy of tissue perfusion will improve 08/19/2023 1146 by Ileana Roup, RN Outcome: Not Met (add Reason) 08/19/2023 1143 by Ileana Roup, RN Outcome: Not Met (add Reason) 08/19/2023 1143 by Ileana Roup, RN Outcome: Not Met (add Reason) 08/19/2023 1141 by Ileana Roup, RN Outcome: Not Met (add Reason)   Problem: Elimination: Goal: Will not experience complications related to bowel motility 08/19/2023 1146 by Ileana Roup, RN Outcome: Not Met (add Reason) 08/19/2023 1143 by Ileana Roup, RN Outcome: Not Met (add Reason) 08/19/2023 1143 by Ileana Roup, RN Outcome: Not Met (add Reason) 08/19/2023 1141 by Ileana Roup, RN Outcome: Not Met (add Reason) Goal: Will not experience complications related to urinary retention 08/19/2023 1146 by Ileana Roup, RN Outcome: Not Met (add Reason) 08/19/2023 1143 by Ileana Roup, RN Outcome: Not Met (add Reason) 08/19/2023 1143 by Ileana Roup,  RN Outcome: Not Met (add Reason) 08/19/2023 1141 by Ileana Roup, RN Outcome: Not Met (add Reason)   Problem: Pain Managment: Goal: General  experience of comfort will improve and/or be controlled 08/19/2023 1146 by Ileana Roup, RN Outcome: Not Met (add Reason) 08/19/2023 1143 by Ileana Roup, RN Outcome: Not Met (add Reason) 08/19/2023 1143 by Ileana Roup, RN Outcome: Not Met (add Reason) 08/19/2023 1141 by Ileana Roup, RN Outcome: Not Met (add Reason)   Problem: Safety: Goal: Ability to remain free from injury will improve 08/19/2023 1146 by Ileana Roup, RN Outcome: Not Met (add Reason) 08/19/2023 1143 by Ileana Roup, RN Outcome: Not Met (add Reason) 08/19/2023 1143 by Ileana Roup, RN Outcome: Not Met (add Reason) 08/19/2023 1141 by Ileana Roup, RN Outcome: Not Met (add Reason)   Problem: Education on Seizure: Goal: Expressions of having a comfortable level of knowledge regarding the disease process will increase 08/19/2023 1146 by Ileana Roup, RN Outcome: Not Met (add Reason) 08/19/2023 1145 by Ileana Roup, RN Outcome: Not Met (add Reason) 08/19/2023 1143 by Ileana Roup, RN Outcome: Not Met (add Reason) 08/19/2023 1143 by Ileana Roup, RN Outcome: Not Met (add Reason)

## 2023-08-19 NOTE — Progress Notes (Signed)
Subjective: Was confused and overnight requiring Haldol  ROS: negative except above Examination  Vital signs in last 24 hours: Temp:  [98 F (36.7 C)-98.5 F (36.9 C)] 98 F (36.7 C) (01/24 0828) Pulse Rate:  [54-80] 54 (01/24 0828) Resp:  [13-19] 13 (01/24 0828) BP: (119-149)/(61-85) 143/61 (01/24 0828) SpO2:  [94 %-96 %] 94 % (01/24 0828)  General: lying in bed, NAD   Neuro: MS: Alert, oriented to self, place: [place], time: January, follows commands CN: pupils equal and reactive,  EOMI, face symmetric, tongue midline, normal sensation over face Motor: 5/5 strength in all 4 extremities Coordination: normal Gait: not tested  Basic Metabolic Panel: Recent Labs  Lab 08/12/23 1504 08/17/23 1155 08/17/23 1555 08/18/23 0513 08/19/23 0939  NA 140 139  --  137 138  K 3.4* 3.6  --  3.2* 3.9  CL 102 100  --  100 101  CO2 28 26  --  27 27  GLUCOSE 208* 135*  --  97 94  BUN 17 <5*  --  11 14  CREATININE 0.56 QUANTITY NOT SUFFICIENT, UNABLE TO PERFORM TEST 0.65 0.67 0.68  CALCIUM 9.0 9.6  --  9.0 9.1  MG  --   --  2.1  --   --     CBC: Recent Labs  Lab 08/12/23 1504 08/17/23 1155 08/17/23 1555 08/18/23 0513 08/19/23 0939  WBC 9.1 11.7* 10.8* 8.4 8.2  NEUTROABS  --  10.3*  --   --  6.4  HGB 12.9 14.7 12.3 11.6* 11.8*  HCT 40.2 46.6* 39.1 35.7* 36.1  MCV 91.4 90.0 90.5 89.5 88.3  PLT 217 152 211 175 181     Coagulation Studies: Recent Labs    08/18/23 0513  LABPROT 14.5  INR 1.1    Imaging personally reviewed  MRI brain with and without contrast 08/18/2023: No acute intracranial process. No definite seizure focus is identified. Advanced cerebral atrophy for age.  ASSESSMENT AND PLAN; 78 year old female with history of dementia who has had 3 presentations with seizure-like activity.  Per description has also had some right facial twitching.  Prior to seizure patient reported "seeing purple".  Family noticed right arm elevation followed by generalized  tonic-clonic seizure, no incontinence, tongue biting.  Of note, patient is on clonazepam at home but UDS on 08/08/2023 was negative for benzos   Seizures -The first seizure could have been in the setting of benzodiazepine withdrawal due to negative UDS even though patient states she has been compliant with medications.  However patient has had 4 seizures since and they appear to have focal onset. -EEG did not show any ictal-interictal abnormality.  However due to multiple seizures, I would recommend starting Keppra 250 mg twice daily -Continue clonazepam and gabapentin (home medications) -Seizure precautions including no driving -As needed IV Ativan for clinical seizures -Follow-up with neurology in 3 months (order placed) -Discussed plan with Dr. Allena Katz via secure chat  Seizure precautions: Per [facility] statutes, patients with seizures are not allowed to drive until they have been seizure-free for six months and cleared by a physician    Use caution when using heavy equipment or power tools. Avoid working on ladders or at heights. Take showers instead of baths. Ensure the water temperature is not too high on the home water heater. Do not go swimming alone. Do not lock yourself in a room alone (i.e. bathroom). When caring for infants or small children, sit down when holding, feeding, or changing them to minimize  risk of injury to the child in the event you have a seizure. Maintain good sleep hygiene. Avoid alcohol.    If patient has another seizure, call 911 and bring them back to the ED if: A.  The seizure lasts longer than 5 minutes.      B.  The patient doesn't wake shortly after the seizure or has new problems such as difficulty seeing, speaking or moving following the seizure C.  The patient was injured during the seizure D.  The patient has a temperature over 102 F (39C) E.  The patient vomited during the seizure and now is having trouble breathing    During the Seizure   -  First, ensure adequate ventilation and place patients on the floor on their left side  Loosen clothing around the neck and ensure the airway is patent. If the patient is clenching the teeth, do not force the mouth open with any object as this can cause severe damage - Remove all items from the surrounding that can be hazardous. The patient may be oblivious to what's happening and may not even know what he or she is doing. If the patient is confused and wandering, either gently guide him/her away and block access to outside areas - Reassure the individual and be comforting - Call 911. In most cases, the seizure ends before EMS arrives. However, there are cases when seizures may last over 3 to 5 minutes. Or the individual may have developed breathing difficulties or severe injuries. If a pregnant patient or a person with diabetes develops a seizure, it is prudent to call an ambulance.   After the Seizure (Postictal Stage)   After a seizure, most patients experience confusion, fatigue, muscle pain and/or a headache. Thus, one should permit the individual to sleep. For the next few days, reassurance is essential. Being calm and helping reorient the person is also of importance.   Most seizures are painless and end spontaneously. Seizures are not harmful to others but can lead to complications such as stress on the lungs, brain and the heart. Individuals with prior lung problems may develop labored breathing and respiratory distress.     I have spent a total of   36 minutes with the patient reviewing hospital notes,  test results, labs and examining the patient as well as establishing an assessment and plan that was discussed personally with the patient.  > 50% of time was spent in direct patient care.          Lindie Spruce Epilepsy [facility] For questions after 5pm please refer to AMION to reach the Neurologist on call

## 2023-08-19 NOTE — TOC Transition Note (Signed)
Transition of Care [facility]) - Discharge Note   Patient Details  Name: [name] MRN: [id number] Date of Birth: 1945-09-27  Transition of Care [facility]) CM/SW Contact:  Kermit Balo, RN Phone Number: 08/19/2023, 3:45 PM   Clinical Narrative:     Pt is discharging home with home health services through [facility]. Information on the AVS.  Walker for home delivered to the room from [employer].  Pt has transportation home.  Final next level of care: Home w Home Health Services Barriers to Discharge: No Barriers Identified   Patient Goals and CMS Choice   CMS Medicare.gov Compare Post Acute Care list provided to:: Patient Choice offered to / list presented to : Patient      Discharge Placement                       Discharge Plan and Services Additional resources added to the After Visit Summary for     Discharge Planning Services: CM Consult Post Acute Care Choice: Home Health          DME Arranged: Walker rolling DME Agency: [employer] Date DME Agency Contacted: 08/19/23   Representative spoke with at DME Agency: d/c lounge HH Arranged: PT, OT HH Agency: [facility] Date [facility] Agency Contacted: 08/19/23   Representative spoke with at [facility] Agency: Kandee Keen  Social Drivers of Health (SDOH) Interventions SDOH Screenings   Food Insecurity: No Food Insecurity (08/17/2023)  Housing: Low Risk  (08/17/2023)  Transportation Needs: No Transportation Needs (08/17/2023)  Utilities: Not At Risk (08/17/2023)  Alcohol Screen: Low Risk  (06/10/2023)  Depression (PHQ2-9): Low Risk  (06/10/2023)  Recent Concern: Depression (PHQ2-9) - High Risk (05/31/2023)  Financial Resource Strain: Low Risk  (06/10/2023)  Physical Activity: Inactive (06/10/2023)  Social Connections: Socially Isolated (08/18/2023)  Stress: No Stress Concern Present (06/10/2023)  Tobacco Use: Low Risk  (08/18/2023)  Health Literacy: Inadequate Health Literacy (06/10/2023)     Readmission Risk  Interventions    07/01/2023    3:51 PM  Readmission Risk Prevention Plan  Post Dischage Appt Complete  Medication Screening Complete  Transportation Screening Complete

## 2023-08-19 NOTE — Progress Notes (Signed)
TRH night cross cover note:  I was notified by RN that this patient is agitated, pulling at their telemetry leads repeatedly attempting to get out of bed, with these behaviors refractory to attempts at verbal redirection.  In the setting of associated interference with ongoing medical treatment posing potential harm to themself, I have placed order for Haldol 5 mg IV x 1 dose prn for agitation.     Newton Pigg, DO Hospitalist

## 2023-08-19 NOTE — Progress Notes (Signed)
DC summary reviewed with patient and family. Family verbalized understanding.    08/19/23 1603  Vitals  Temp 98 F (36.7 C)  Temp Source Axillary  BP 110/62  MAP (mmHg) 78  BP Location Left Wrist  BP Method Automatic  Patient Position (if appropriate) Lying  ECG Heart Rate (!) 57  Resp 13  Level of Consciousness  Level of Consciousness Alert  MEWS COLOR  MEWS Score Color Green  Oxygen Therapy  SpO2 98 %  O2 Device Room Air  MEWS Score  MEWS Temp 0  MEWS Systolic 0  MEWS Pulse 0  MEWS RR 1  MEWS LOC 0  MEWS Score 1

## 2023-08-19 NOTE — TOC Initial Note (Signed)
Transition of Care [facility]) - Initial/Assessment Note    Patient Details  Name: [name] MRN: [id number] Date of Birth: 28-Nov-1945  Transition of Care [facility]) CM/SW Contact:    Kermit Balo, RN Phone Number: 08/19/2023, 2:59 PM  Clinical Narrative:                  Pt is from home with her daughter and granddaughter. She states someone is with her all the time. Daughter provides needed transportation and manages her medications. Home therapy arranged with [facility]. Information on the AVS. Frances Furbish will contact her for the first home visit. TOC following.  Expected Discharge Plan: Home w Home Health Services Barriers to Discharge: Continued Medical Work up   Patient Goals and CMS Choice   CMS Medicare.gov Compare Post Acute Care list provided to:: Patient Choice offered to / list presented to : Patient      Expected Discharge Plan and Services   Discharge Planning Services: CM Consult Post Acute Care Choice: Home Health Living arrangements for the past 2 months: Single Family Home                           HH Arranged: PT, OT HH Agency: [facility] Date [facility] Agency Contacted: 08/19/23   Representative spoke with at [facility] Agency: Kandee Keen  Prior Living Arrangements/Services Living arrangements for the past 2 months: Single Family Home Lives with:: Adult Children Patient language and need for interpreter reviewed:: Yes Do you feel safe going back to the place where you live?: Yes          Current home services: DME (cane/ walker/ wheelchair/ bsc) Criminal Activity/Legal Involvement Pertinent to Current Situation/Hospitalization: No - Comment as needed  Activities of Daily Living   ADL Screening (condition at time of admission) Independently performs ADLs?: No Does the patient have a NEW difficulty with bathing/dressing/toileting/self-feeding that is expected to last >3 days?: No Does the patient have a NEW difficulty with getting in/out of bed, walking,  or climbing stairs that is expected to last >3 days?: Yes (Initiates electronic notice to provider for possible PT consult) Does the patient have a NEW difficulty with communication that is expected to last >3 days?: No Is the patient deaf or have difficulty hearing?: No Does the patient have difficulty seeing, even when wearing glasses/contacts?: No Does the patient have difficulty concentrating, remembering, or making decisions?: No  Permission Sought/Granted                  Emotional Assessment Appearance:: Appears stated age Attitude/Demeanor/Rapport: Engaged Affect (typically observed): Accepting Orientation: : Oriented to Self, Oriented to Place, Oriented to  Time, Oriented to Situation   Psych Involvement: No (comment)  Admission diagnosis:  Confusion [R41.0] Seizures (HCC) [R56.9] Seizure-like activity (HCC) [R56.9] Patient Active Problem List   Diagnosis Date Noted   Seizures (HCC) 08/17/2023   REM sleep behavior disorder    Generalized anxiety disorder    Acute metabolic encephalopathy 07/19/2023   Hypokalemia 07/19/2023   Constipation 07/19/2023   Parkinson's disease    Bilateral sensorineural hearing loss 05/03/2023   Foul smelling urine 02/08/2023   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 [place] [address] Phone: [phone] Fax:  [phone]  [facility] [address] [place] [address] Phone: [phone] Fax: [phone]  [facility] [address] [place] [address] Phone: [phone] Fax: [phone]     Social Drivers of Health (SDOH) Social History: SDOH Screenings   Food Insecurity: No Food Insecurity (08/17/2023)  Housing: Low Risk  (08/17/2023)  Transportation Needs: No Transportation Needs (08/17/2023)  Utilities: Not At Risk (08/17/2023)  Alcohol Screen: Low Risk  (06/10/2023)  Depression (PHQ2-9): Low Risk  (06/10/2023)  Recent Concern: Depression (PHQ2-9) - High Risk (05/31/2023)  Financial Resource Strain: Low Risk  (06/10/2023)  Physical Activity: Inactive (06/10/2023)  Social Connections: Socially Isolated (08/18/2023)  Stress: No Stress Concern Present (06/10/2023)  Tobacco Use: Low Risk  (08/18/2023)  Health Literacy: Inadequate Health Literacy (06/10/2023)   SDOH Interventions:     Readmission Risk Interventions    07/01/2023    3:51 PM  Readmission Risk Prevention Plan  Post Dischage Appt Complete  Medication Screening Complete  Transportation Screening Complete

## 2023-08-20 NOTE — Discharge Summary (Signed)
Physician Discharge Summary   Patient: [name] MRN: [id number] DOB: 10/20/1945  Admit date:     08/17/2023  Discharge date: 08/19/2023  Discharge Physician: Lynden Oxford  PCP: Doreene Nest, NP  Recommendations at discharge: Follow-up with PCP. Follow-up with Neurology.   Follow-up Information     [facility], [facility] Follow up.   Specialty: Home Health Services Why: The home health agency will contact you for the first home visit Contact information: [address] [place] [address] [phone]         Doreene Nest, NP. Schedule an appointment as soon as possible for a visit in 1 week(s).   Specialty: Internal Medicine Contact information: [address] [place] [address] [phone]         [facility]. Schedule an appointment as soon as possible for a visit in 2 month(s).   Specialty: Neurology Contact information: [address] [place] [address] [phone]               Discharge Diagnoses: Principal Problem:   Seizures [facility]) Active Problems:   Type 2 diabetes mellitus with hyperglycemia   Vascular parkinsonism  Hospital Course: PMH of dementia, Parkinson disease, HTN, GERD.  Present to the hospital with complaints of seizures. Neurology was consulted. Started on Keppra now.  Assessment and Plan: Seizures (HCC) Patient has had seizure-like activity in the last 1 week.  Patient currently is at her neurologic baseline. CT head is nonactionable MRI brain with and without contrast currently pending. Neurology consulted. Initiated on Keppra.  No seizures seen on the LTM EEG. Speech therapy was consulted.  Currently on dysphagia diet.   Generalized anxiety disorder chornic documented, continue with Lexapro and mirtazapine   Vascular parkinsonism Chronic diagnosis.  Continue with Sinemet.  Patient still has some tremors of her face and  tongue.   Type 2 diabetes mellitus with hyperglycemia Resume home regimen.  Consultants:  Neurology  Procedures performed:  LTM EEG  DISCHARGE MEDICATION: Allergies as of 08/19/2023       Reactions   Codeine Sulfate    REACTION: hallucinations   Penicillins Anaphylaxis, Swelling, Rash   Tolerates Rocephin   Amoxapine And Related    Wellbutrin [bupropion] Other (See Comments)   Seizures and hallucinations        Medication List     STOP taking these medications    fluticasone 50 MCG/ACT nasal spray Commonly known as: FLONASE   ibuprofen 200 MG tablet Commonly known as: ADVIL       TAKE these medications    Accu-Chek Guide test strip Generic drug: glucose blood USE UP TO 4 TIMES DAILY AS DIRECTED   Accu-Chek Softclix Lancets lancets USE UP TO 4 TIMES DAILY AS DIRECTED   acetaminophen 500 MG tablet Commonly known as: TYLENOL Take 500 mg by mouth every 6 (six) hours as needed for moderate pain (pain score 4-6) or mild pain (pain score 1-3).   aspirin 81 MG tablet Take 81 mg by mouth daily.   blood glucose meter kit and supplies Kit Dispense based on patient and insurance preference. Use up to four times daily as directed. (FOR ICD-9 250.00, 250.01).   calcium carbonate 750 MG chewable tablet Commonly known as: TUMS EX Chew 1 tablet by mouth as needed.   carbidopa-levodopa 25-100 MG tablet Commonly known as: SINEMET IR TAKE 3 TABLETS BY MOUTH 3 TIMES DAILY. What changed: See the new instructions.  cetirizine 10 MG tablet Commonly known as: ZYRTEC Take 10 mg by mouth at bedtime.   clonazePAM 0.5 MG tablet Commonly known as: KLONOPIN TAKE 1/2 TABLET BY MOUTH AT BEDTIME AND 1/2 DAILY AS NEEDED What changed: See the new instructions.   donepezil 5 MG tablet Commonly known as: ARICEPT TAKE 1 TABLET BY MOUTH EVERYDAY AT BEDTIME What changed: See the new instructions.   escitalopram 10 MG tablet Commonly known as: LEXAPRO TAKE 1 TABLET BY MOUTH  EVERY DAY What changed: when to take this   gabapentin 100 MG capsule Commonly known as: NEURONTIN TAKE 1 CAPSULE BY MOUTH THREE TIMES A DAY What changed: See the new instructions.   levETIRAcetam 250 MG tablet Commonly known as: KEPPRA Take 1 tablet (250 mg total) by mouth 2 (two) times daily.   memantine 10 MG tablet Commonly known as: NAMENDA Take 1 tablet (10 mg total) by mouth 2 (two) times daily.   mirtazapine 30 MG tablet Commonly known as: REMERON TAKE 1 TABLET (30 MG TOTAL) BY MOUTH AT BEDTIME. FOR SLEEP.   montelukast 10 MG tablet Commonly known as: SINGULAIR TAKE 1 TABLET (10 MG TOTAL) BY MOUTH AT BEDTIME. FOR ALLERGIES   rosuvastatin 5 MG tablet Commonly known as: CRESTOR TAKE 1 TABLET (5 MG TOTAL) BY MOUTH EVERY EVENING. FOR CHOLESTEROL.   Vitamin D3 25 MCG (1000 UT) Caps Take 1 capsule by mouth daily.       Disposition: Home Diet recommendation: Regular diet  Discharge Exam: Vitals:   08/19/23 0427 08/19/23 0828 08/19/23 1453 08/19/23 1603  BP: (!) 140/63 (!) 143/61 127/67 110/62  Pulse:  (!) 54 70   Resp:  13 17 13   Temp: 98.4 F (36.9 C) 98 F (36.7 C) 98 F (36.7 C) 98 F (36.7 C)  TempSrc: Axillary Axillary  Axillary  SpO2:  94% 98% 98%  Weight:      Height:       General: Appear in mild distress; no visible Abnormal Neck Mass Or lumps, Conjunctiva normal Cardiovascular: S1 and S2 Present, no Murmur, Respiratory: good respiratory effort, Bilateral Air entry present and CTA, no Crackles, no wheezes Abdomen: Bowel Sound present, Non tender  Extremities: no Pedal edema Neurology: alert and oriented to place and person no focal deficit. Filed Weights   08/17/23 1955  Weight: 63.3 kg   Condition at discharge: stable  The results of significant diagnostics from this hospitalization (including imaging, microbiology, ancillary and laboratory) are listed below for reference.   Imaging Studies: MR BRAIN W WO CONTRAST Result Date:  08/18/2023 CLINICAL DATA:  Seizure, new onset, no history of trauma EXAM: MRI HEAD WITHOUT AND WITH CONTRAST TECHNIQUE: Multiplanar, multiecho pulse sequences of the brain and surrounding structures were obtained without and with intravenous contrast. CONTRAST:  6.77mL GADAVIST GADOBUTROL 1 MMOL/ML IV SOLN COMPARISON:  09/13/2013 MRI head, 08/17/2023 CT head FINDINGS: Evaluation is somewhat limited by motion artifact. Brain: No restricted diffusion to suggest acute or subacute infarct. No abnormal parenchymal or meningeal enhancement. The hippocampi are roughly symmetric in size and signal, with possible atrophy bilaterally but no definite abnormally increased T2 hyperintense signal. No heterotopia or evidence of cortical dysgenesis. No acute hemorrhage, mass, mass effect, or midline shift. No hydrocephalus or extra-axial collection. Pituitary and craniocervical junction within normal limits. Advanced cerebral atrophy for age, with ex vacuo dilatation of the ventricles. Confluent T2 hyperintense signal in the periventricular white matter, likely the sequela of moderate chronic small vessel ischemic disease. Vascular: Normal arterial flow voids. Normal  arterial and venous enhancement. Skull and upper cervical spine: Normal marrow signal. Sinuses/Orbits: Mucosal thickening in the paranasal sinuses. Status post bilateral lens replacements. Other: The mastoid air cells are well aerated. IMPRESSION: 1. No acute intracranial process. No definite seizure focus is identified. 2. Advanced cerebral atrophy for age. Electronically Signed   By: Wiliam Ke M.D.   On: 08/18/2023 19:57   Overnight EEG with video Result Date: 08/18/2023 Charlsie Quest, MD     08/18/2023 10:03 AM Patient Name: [name] MRN: [id number] Epilepsy Attending: Charlsie Quest Referring Physician/Provider: Caryl Pina, MD Duration: 08/17/2023 2111 to 08/18/2023 8469  Patient history: 78yo F with ams getting eeg to evaluate for seizure  Level  of alertness: Awake, asleep  AEDs during EEG study: Ativan  Technical aspects: This EEG study was done with scalp electrodes positioned according to the 10-20 International system of electrode placement. Electrical activity was reviewed with band pass filter of 1-70Hz , sensitivity of 7 uV/mm, display speed of 70mm/sec with a 60Hz  notched filter applied as appropriate. EEG data were recorded continuously and digitally stored.  Video monitoring was available and reviewed as appropriate.  Description: The posterior dominant rhythm consists of 8-9 Hz activity of moderate voltage (25-35 uV) seen predominantly in posterior head regions, symmetric and reactive to eye opening and eye closing.  IV Ativan was administered on 08/17/2023 at around 2116.  Subsequently patient fell asleep.  Sleep was characterized by vertex waves, sleep symptoms (12 to 14 hours), maximal frontocentral region).  EEG also showed continuous generalized 3-5 Hz theta-delta slowing admixed within excessive amount of sharply contoured 15 to 18 Hz beta activity with irregular morphology distributed symmetrically and diffusely. Hyperventilation and photic stimulation were not performed.   Of note, EEG was difficult to interpret between 08/18/2023 0535 to 0756 due to significant electrode artifact.  ABNORMALITY - Continuous slow, generalized - Excessive beta, generalized  IMPRESSION: This study is suggestive of mild to moderate diffuse encephalopathy. The excessive beta activity seen in the background is most likely due to the effect of benzodiazepine use.   No seizures or epileptiform discharges were seen during the study.  Charlsie Quest   EEG adult Result Date: 08/18/2023 Charlsie Quest, MD     08/18/2023  8:30 AM Patient Name: [name] MRN: [id number] Epilepsy Attending: Charlsie Quest Referring Physician/Provider: Nolberto Hanlon, MD Date: 08/17/2023 Duration: 23.37 mins Patient history: 78yo F with ams getting eeg to evaluate for seizure  Level of alertness: Awake AEDs during EEG study: None Technical aspects: This EEG study was done with scalp electrodes positioned according to the 10-20 International system of electrode placement. Electrical activity was reviewed with band pass filter of 1-70Hz , sensitivity of 7 uV/mm, display speed of 78mm/sec with a 60Hz  notched filter applied as appropriate. EEG data were recorded continuously and digitally stored.  Video monitoring was available and reviewed as appropriate. Description: The posterior dominant rhythm consists of 8-9 Hz activity of moderate voltage (25-35 uV) seen predominantly in posterior head regions, symmetric and reactive to eye opening and eye closing.  EEG also showed continuous generalized 3-5Hz  theta- delta slowing admixed within excessive amount of sharply contoured 15 to 18 Hz beta activity with irregular morphology distributed symmetrically and diffusely. Hyperventilation and photic stimulation were not performed.   ABNORMALITY - Continuous slow, generalized - Excessive beta, generalized IMPRESSION: This study is suggestive of mild to moderate diffuse encephalopathy. The excessive beta activity seen in the background is most likely due to the  effect of benzodiazepine use.  However the morphology of beta activity is sharply contoured.  Therefore, if patient is not at baseline, can consider Ativan challenge and long-term monitoring for further evaluation of the seizure pattern. Dr. Otelia Limes was notified. Charlsie Quest   CT Head Wo Contrast Result Date: 08/17/2023 CLINICAL DATA:  Seizure, new-onset, history of trauma; Polytrauma, blunt EXAM: CT HEAD WITHOUT CONTRAST CT MAXILLOFACIAL WITHOUT CONTRAST CT CERVICAL SPINE WITHOUT CONTRAST TECHNIQUE: Multidetector CT imaging of the head, cervical spine, and maxillofacial structures were performed using the standard protocol without intravenous contrast. Multiplanar CT image reconstructions of the cervical spine and maxillofacial  structures were also generated. RADIATION DOSE REDUCTION: This exam was performed according to the departmental dose-optimization program which includes automated exposure control, adjustment of the mA and/or kV according to patient size and/or use of iterative reconstruction technique. COMPARISON:  Head CT 08/08/2023 FINDINGS: CT HEAD FINDINGS Brain: No hemorrhage. No hydrocephalus. No extra-axial fluid collection. No CT evidence of an acute cortical infarct. No mass effect. No mass lesion. Generalized volume loss. Vascular: No hyperdense vessel or unexpected calcification. Skull: Normal. Negative for fracture or focal lesion. Other: None. CT MAXILLOFACIAL FINDINGS Osseous: No fracture or mandibular dislocation. No destructive process. Orbits: Negative. No traumatic or inflammatory finding. Sinuses: No middle ear or mastoid effusion. Paranasal sinuses are notable for mucosal thickening in the left maxillary sinus. Bilateral lens replacement. Orbits are otherwise unremarkable. Postsurgical changes from bilateral maxillary antrostomies. Soft tissues: Negative. CT CERVICAL SPINE FINDINGS Alignment: Normal. Skull base and vertebrae: No acute fracture. No primary bone lesion or focal pathologic process. Soft tissues and spinal canal: No prevertebral fluid or swelling. No visible canal hematoma. Disc levels:  No evidence of high-grade spinal canal stenosis Upper chest: Negative. Other: None IMPRESSION: 1. No CT evidence of intracranial injury. 2. No acute facial bone fracture. 3. No acute fracture or traumatic subluxation of the cervical spine. Electronically Signed   By: Lorenza Cambridge M.D.   On: 08/17/2023 13:41   CT Cervical Spine Wo Contrast Result Date: 08/17/2023 CLINICAL DATA:  Seizure, new-onset, history of trauma; Polytrauma, blunt EXAM: CT HEAD WITHOUT CONTRAST CT MAXILLOFACIAL WITHOUT CONTRAST CT CERVICAL SPINE WITHOUT CONTRAST TECHNIQUE: Multidetector CT imaging of the head, cervical spine, and  maxillofacial structures were performed using the standard protocol without intravenous contrast. Multiplanar CT image reconstructions of the cervical spine and maxillofacial structures were also generated. RADIATION DOSE REDUCTION: This exam was performed according to the departmental dose-optimization program which includes automated exposure control, adjustment of the mA and/or kV according to patient size and/or use of iterative reconstruction technique. COMPARISON:  Head CT 08/08/2023 FINDINGS: CT HEAD FINDINGS Brain: No hemorrhage. No hydrocephalus. No extra-axial fluid collection. No CT evidence of an acute cortical infarct. No mass effect. No mass lesion. Generalized volume loss. Vascular: No hyperdense vessel or unexpected calcification. Skull: Normal. Negative for fracture or focal lesion. Other: None. CT MAXILLOFACIAL FINDINGS Osseous: No fracture or mandibular dislocation. No destructive process. Orbits: Negative. No traumatic or inflammatory finding. Sinuses: No middle ear or mastoid effusion. Paranasal sinuses are notable for mucosal thickening in the left maxillary sinus. Bilateral lens replacement. Orbits are otherwise unremarkable. Postsurgical changes from bilateral maxillary antrostomies. Soft tissues: Negative. CT CERVICAL SPINE FINDINGS Alignment: Normal. Skull base and vertebrae: No acute fracture. No primary bone lesion or focal pathologic process. Soft tissues and spinal canal: No prevertebral fluid or swelling. No visible canal hematoma. Disc levels:  No evidence of high-grade spinal canal stenosis Upper chest: Negative. Other:  None IMPRESSION: 1. No CT evidence of intracranial injury. 2. No acute facial bone fracture. 3. No acute fracture or traumatic subluxation of the cervical spine. Electronically Signed   By: Lorenza Cambridge M.D.   On: 08/17/2023 13:41   CT Maxillofacial WO CM Result Date: 08/17/2023 CLINICAL DATA:  Seizure, new-onset, history of trauma; Polytrauma, blunt EXAM: CT HEAD  WITHOUT CONTRAST CT MAXILLOFACIAL WITHOUT CONTRAST CT CERVICAL SPINE WITHOUT CONTRAST TECHNIQUE: Multidetector CT imaging of the head, cervical spine, and maxillofacial structures were performed using the standard protocol without intravenous contrast. Multiplanar CT image reconstructions of the cervical spine and maxillofacial structures were also generated. RADIATION DOSE REDUCTION: This exam was performed according to the departmental dose-optimization program which includes automated exposure control, adjustment of the mA and/or kV according to patient size and/or use of iterative reconstruction technique. COMPARISON:  Head CT 08/08/2023 FINDINGS: CT HEAD FINDINGS Brain: No hemorrhage. No hydrocephalus. No extra-axial fluid collection. No CT evidence of an acute cortical infarct. No mass effect. No mass lesion. Generalized volume loss. Vascular: No hyperdense vessel or unexpected calcification. Skull: Normal. Negative for fracture or focal lesion. Other: None. CT MAXILLOFACIAL FINDINGS Osseous: No fracture or mandibular dislocation. No destructive process. Orbits: Negative. No traumatic or inflammatory finding. Sinuses: No middle ear or mastoid effusion. Paranasal sinuses are notable for mucosal thickening in the left maxillary sinus. Bilateral lens replacement. Orbits are otherwise unremarkable. Postsurgical changes from bilateral maxillary antrostomies. Soft tissues: Negative. CT CERVICAL SPINE FINDINGS Alignment: Normal. Skull base and vertebrae: No acute fracture. No primary bone lesion or focal pathologic process. Soft tissues and spinal canal: No prevertebral fluid or swelling. No visible canal hematoma. Disc levels:  No evidence of high-grade spinal canal stenosis Upper chest: Negative. Other: None IMPRESSION: 1. No CT evidence of intracranial injury. 2. No acute facial bone fracture. 3. No acute fracture or traumatic subluxation of the cervical spine. Electronically Signed   By: Lorenza Cambridge M.D.   On:  08/17/2023 13:41   CT Head Wo Contrast Result Date: 08/08/2023 CLINICAL DATA:  Seizure disorder, clinical change EXAM: CT HEAD WITHOUT CONTRAST TECHNIQUE: Contiguous axial images were obtained from the base of the skull through the vertex without intravenous contrast. RADIATION DOSE REDUCTION: This exam was performed according to the departmental dose-optimization program which includes automated exposure control, adjustment of the mA and/or kV according to patient size and/or use of iterative reconstruction technique. COMPARISON:  CT head 06/29/2023. FINDINGS: Brain: No evidence of acute infarction, hemorrhage, hydrocephalus, extra-axial collection or mass lesion/mass effect. Patchy white matter hypodensities are nonspecific but compatible with microvascular ischemic change. Similar cerebral atrophy. Vascular: No hyperdense vessel identified. Calcific atherosclerosis. Skull: No acute fracture. Sinuses/Orbits: Clear sinuses.  No acute orbital findings. Other: New mastoid effusions. IMPRESSION: Stable head CT.  No evidence of acute intracranial abnormality. Electronically Signed   By: Feliberto Harts M.D.   On: 08/08/2023 13:14    Microbiology: Results for orders placed or performed during the hospital encounter of 08/17/23  MRSA Next Gen by PCR, Nasal     Status: None   Collection Time: 08/18/23  3:19 AM   Specimen: Nasal Mucosa; Nasal Swab  Result Value Ref Range Status   MRSA by PCR Next Gen NOT DETECTED NOT DETECTED Final    Comment: (NOTE) The GeneXpert MRSA Assay (FDA approved for NASAL specimens only), is one component of a comprehensive MRSA colonization surveillance program. It is not intended to diagnose MRSA infection nor to guide or monitor treatment for MRSA infections. Test  performance is not FDA approved in patients less than 33 years old. Performed at [facility], [address] [place], [place] [address]    Labs: CBC: Recent Labs  Lab 08/17/23 1155 08/17/23 1555  08/18/23 0513 08/19/23 0939  WBC 11.7* 10.8* 8.4 8.2  NEUTROABS 10.3*  --   --  6.4  HGB 14.7 12.3 11.6* 11.8*  HCT 46.6* 39.1 35.7* 36.1  MCV 90.0 90.5 89.5 88.3  PLT 152 211 175 181   Basic Metabolic Panel: Recent Labs  Lab 08/17/23 1155 08/17/23 1555 08/18/23 0513 08/19/23 0939  NA 139  --  137 138  K 3.6  --  3.2* 3.9  CL 100  --  100 101  CO2 26  --  27 27  GLUCOSE 135*  --  97 94  BUN <5*  --  11 14  CREATININE QUANTITY NOT SUFFICIENT, UNABLE TO PERFORM TEST 0.65 0.67 0.68  CALCIUM 9.6  --  9.0 9.1  MG  --  2.1  --   --    Liver Function Tests: Recent Labs  Lab 08/17/23 1155  AST 10*  ALT <5  ALKPHOS 78  BILITOT 0.7  PROT 7.3  ALBUMIN 3.9   CBG: Recent Labs  Lab 08/17/23 1148 08/17/23 1708 08/17/23 2301 08/18/23 0613  GLUCAP 126* 103* 106* 98    Discharge time spent: greater than 30 minutes.  Author: Lynden Oxford, MD  [facility] 08/19/2023

## 2023-08-21 ENCOUNTER — Other Ambulatory Visit: Payer: Self-pay

## 2023-08-21 ENCOUNTER — Emergency Department (HOSPITAL_COMMUNITY)
Admission: EM | Admit: 2023-08-21 | Discharge: 2023-08-21 | Disposition: A | Discharge status: Home / Self Care | Payer: Medicare Other | Attending: Emergency Medicine | Admitting: Emergency Medicine

## 2023-08-21 ENCOUNTER — Encounter (HOSPITAL_COMMUNITY): Payer: Self-pay | Admitting: Emergency Medicine

## 2023-08-21 DIAGNOSIS — R569 Unspecified convulsions: Secondary | ICD-10-CM | POA: Insufficient documentation

## 2023-08-21 DIAGNOSIS — S00512A Abrasion of oral cavity, initial encounter: Secondary | ICD-10-CM | POA: Diagnosis not present

## 2023-08-21 DIAGNOSIS — F0390 Unspecified dementia without behavioral disturbance: Secondary | ICD-10-CM | POA: Insufficient documentation

## 2023-08-21 DIAGNOSIS — X58XXXA Exposure to other specified factors, initial encounter: Secondary | ICD-10-CM | POA: Insufficient documentation

## 2023-08-21 DIAGNOSIS — S0993XA Unspecified injury of face, initial encounter: Secondary | ICD-10-CM | POA: Diagnosis present

## 2023-08-21 DIAGNOSIS — D696 Thrombocytopenia, unspecified: Secondary | ICD-10-CM | POA: Insufficient documentation

## 2023-08-21 DIAGNOSIS — E119 Type 2 diabetes mellitus without complications: Secondary | ICD-10-CM | POA: Insufficient documentation

## 2023-08-21 DIAGNOSIS — I10 Essential (primary) hypertension: Secondary | ICD-10-CM | POA: Diagnosis not present

## 2023-08-21 DIAGNOSIS — R404 Transient alteration of awareness: Secondary | ICD-10-CM | POA: Diagnosis not present

## 2023-08-21 DIAGNOSIS — G20A1 Parkinson's disease without dyskinesia, without mention of fluctuations: Secondary | ICD-10-CM | POA: Insufficient documentation

## 2023-08-21 DIAGNOSIS — G40919 Epilepsy, unspecified, intractable, without status epilepticus: Secondary | ICD-10-CM

## 2023-08-21 HISTORY — DX: Unspecified convulsions: R56.9

## 2023-08-21 LAB — CBC
HCT: 38.8 % (ref 36.0–46.0)
Hemoglobin: 12.5 g/dL (ref 12.0–15.0)
MCH: 29.1 pg (ref 26.0–34.0)
MCHC: 32.2 g/dL (ref 30.0–36.0)
MCV: 90.2 fL (ref 80.0–100.0)
Platelets: 146 10*3/uL — ABNORMAL LOW (ref 150–400)
RBC: 4.3 MIL/uL (ref 3.87–5.11)
RDW: 14.2 % (ref 11.5–15.5)
WBC: 6.7 10*3/uL (ref 4.0–10.5)
nRBC: 0 % (ref 0.0–0.2)

## 2023-08-21 LAB — MAGNESIUM: Magnesium: 1.8 mg/dL (ref 1.7–2.4)

## 2023-08-21 LAB — BASIC METABOLIC PANEL
Anion gap: 15 (ref 5–15)
BUN: 16 mg/dL (ref 8–23)
CO2: 24 mmol/L (ref 22–32)
Calcium: 9.2 mg/dL (ref 8.9–10.3)
Chloride: 99 mmol/L (ref 98–111)
Creatinine, Ser: 0.8 mg/dL (ref 0.44–1.00)
GFR, Estimated: >60 mL/min (ref >60)
Glucose, Bld: 202 mg/dL — ABNORMAL HIGH (ref 70–99)
Potassium: 3.6 mmol/L (ref 3.5–5.1)
Sodium: 138 mmol/L (ref 135–145)

## 2023-08-21 LAB — PHOSPHORUS: Phosphorus: 3.4 mg/dL (ref 2.5–4.6)

## 2023-08-21 MED ORDER — ACETAMINOPHEN 325 MG PO TABS
650.0000 mg | ORAL_TABLET | Freq: Once | ORAL | Status: AC
  Administered 2023-08-21: 650 mg via ORAL
  Filled 2023-08-21: qty 2

## 2023-08-21 MED ORDER — LEVETIRACETAM IN NACL 1000 MG/100ML IV SOLN
1000.0000 mg | INTRAVENOUS | Status: AC
  Administered 2023-08-21: 1000 mg via INTRAVENOUS
  Filled 2023-08-21: qty 100

## 2023-08-21 MED ORDER — GABAPENTIN 100 MG PO CAPS
100.0000 mg | ORAL_CAPSULE | Freq: Once | ORAL | Status: AC
  Administered 2023-08-21: 100 mg via ORAL
  Filled 2023-08-21: qty 1

## 2023-08-21 MED ORDER — SODIUM CHLORIDE 0.9 % IV SOLN: 2000.0000 mg | Freq: Once | INTRAVENOUS | Status: DC

## 2023-08-21 MED ORDER — LEVETIRACETAM 500 MG PO TABS
500.0000 mg | ORAL_TABLET | Freq: Two times a day (BID) | ORAL | 0 refills | Status: DC
Start: 2023-08-21 — End: 2023-10-04

## 2023-08-21 MED ORDER — CARBIDOPA-LEVODOPA ER 25-100 MG PO TBCR
1.0000 | EXTENDED_RELEASE_TABLET | Freq: Once | ORAL | Status: AC
  Administered 2023-08-21: 1 via ORAL
  Filled 2023-08-21: qty 1

## 2023-08-21 NOTE — ED Triage Notes (Signed)
Pt BIB [facility] Pt had 1 sz with family in car (last 1 minute) and then one with EMS (lasted 1 minute). Per EMS started as absent sz then progressed into muscle rigidness no meds were given by EMS. Postictal with EMS - lethargic. Arrived alert x1.   Recently diagnosis with sz 3 weeks ago, started on keppra Hx of dementia/parkinsons  18 LAC 168/69 96 HR 233 Sugar

## 2023-08-21 NOTE — Discharge Instructions (Addendum)
You were seen in the emergency department for your breakthrough seizure today.  Your lab work and EKG were normal and you have no further seizures while you are in the emergency department.  We spoke with neurology who recommended increasing your Keppra to 500 mg twice daily.  We did give you a loading dose of Keppra tonight through the IV and you should start the new dose of Keppra tomorrow morning and continue to take all your other regular medications as prescribed.  You should follow-up with neurology for further management of your seizures.  I have placed a referral so they should be calling you to schedule follow-up appointment however if you do not hear from them in the next few days I would give them a call.  You should return to the emergency department if you are having prolonged seizures lasting more than 5 to 10 minutes, back-to-back seizures without returning to your self in between, and you injure yourself during a seizure or any other new or concerning symptoms.
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
## 2023-08-22 ENCOUNTER — Telehealth: Payer: Self-pay | Admitting: *Deleted

## 2023-08-22 NOTE — Transitions of Care (Post Inpatient/ED Visit) (Signed)
08/22/2023  Name: [name] MRN: [id number] DOB: 1946/04/14  Today's TOC FU Call Status: Today's TOC FU Call Status:: Successful TOC FU Call Completed TOC FU Call Complete Date: 08/22/23 Patient's Name and Date of Birth confirmed.  Transition Care Management Follow-up Telephone Call Date of Discharge: 08/19/23 Discharge Facility: [facility]) Type of Discharge: Inpatient Admission Primary Inpatient Discharge Diagnosis:: Seizures How have you been since you were released from the hospital?: Better Any questions or concerns?: Yes  Items Reviewed: Did you receive and understand the discharge instructions provided?: Yes Medications obtained,verified, and reconciled?: Yes (Medications Reviewed) Any new allergies since your discharge?: No Dietary orders reviewed?: No Do you have support at home?: Yes People in Home: child(ren), adult Name of Support/Comfort Primary Source: Adela Lank  Medications Reviewed Today: Medications Reviewed Today     Reviewed by Luella Cook, RN (Case Manager) on 08/22/23 at 1046  Med List Status: <None>   Medication Order Taking? Sig Documenting Provider Last Dose Status Informant  ACCU-CHEK GUIDE test strip [id number] Yes USE UP TO 4 TIMES DAILY AS DIRECTED Doreene Nest, NP Taking Active Child, Pharmacy Records  Accu-Chek Softclix Lancets lancets [id number] Yes USE UP TO 4 TIMES DAILY AS DIRECTED Doreene Nest, NP Taking Active Child, Pharmacy Records  acetaminophen (TYLENOL) 500 MG tablet [id number] Yes Take 500 mg by mouth every 6 (six) hours as needed for moderate pain (pain score 4-6) or mild pain (pain score 1-3). [provider] Taking Active Child, Pharmacy Records  aspirin 81 MG tablet [id number] Yes Take 81 mg by mouth daily. [provider] Taking Active Child, Pharmacy Records  blood glucose meter kit and supplies KIT [id number] Yes Dispense based on patient and insurance preference. Use up to four times  daily as directed. (FOR ICD-9 250.00, 250.01). Doreene Nest, NP Taking Active Child, Pharmacy Records  calcium carbonate (TUMS EX) 750 MG chewable tablet [id number] Yes Chew 1 tablet by mouth as needed. [provider] Taking Active Child, Pharmacy Records  carbidopa-levodopa (SINEMET IR) 25-100 MG tablet [id number] Yes TAKE 3 TABLETS BY MOUTH 3 TIMES DAILY.  Patient taking differently: Take 3 tablets by mouth 3 (three) times daily.   Vladimir Faster, DO Taking Active Child, Pharmacy Records  cetirizine (ZYRTEC) 10 MG tablet [id number] Yes Take 10 mg by mouth at bedtime. [provider] Taking Active Child, Pharmacy Records  Cholecalciferol (VITAMIN D3) 1000 UNITS CAPS [id number] Yes Take 1 capsule by mouth daily. [provider] Taking Active Child, Pharmacy Records  clonazePAM (KLONOPIN) 0.5 MG tablet [id number] Yes TAKE 1/2 TABLET BY MOUTH AT BEDTIME AND 1/2 DAILY AS NEEDED  Patient taking differently: Take 0.25 mg by mouth 2 (two) times daily.   Vladimir Faster, DO Taking Active Child, Pharmacy Records  donepezil (ARICEPT) 5 MG tablet [id number] Yes TAKE 1 TABLET BY MOUTH EVERYDAY AT BEDTIME  Patient taking differently: Take 5 mg by mouth at bedtime.   Vladimir Faster, DO Taking Active Child, Pharmacy Records  escitalopram (LEXAPRO) 10 MG tablet [id number] Yes TAKE 1 TABLET BY MOUTH EVERY DAY  Patient taking differently: Take 10 mg by mouth in the morning.   Vladimir Faster, DO Taking Active Child, Pharmacy Records  gabapentin (NEURONTIN) 100 MG capsule [id number] Yes TAKE 1 CAPSULE BY MOUTH THREE TIMES A DAY  Patient taking differently: Take 100 mg by mouth 3 (three) times daily.   Vladimir Faster, DO Taking Active Child, Pharmacy Records  levETIRAcetam (KEPPRA) 500 MG tablet [id number]  Yes Take 1 tablet (500 mg total) by mouth 2 (two) times daily. Rexford Maus, DO Taking Active   memantine (NAMENDA) 10 MG tablet [id number] Yes Take 1 tablet (10 mg total) by mouth 2  (two) times daily. Vladimir Faster, DO Taking Active Child, Pharmacy Records  mirtazapine (REMERON) 30 MG tablet [id number] Yes TAKE 1 TABLET (30 MG TOTAL) BY MOUTH AT BEDTIME. FOR SLEEP. Doreene Nest, NP Taking Active Child, Pharmacy Records  montelukast (SINGULAIR) 10 MG tablet [id number] Yes TAKE 1 TABLET (10 MG TOTAL) BY MOUTH AT BEDTIME. FOR ALLERGIES Doreene Nest, NP Taking Active Child, Pharmacy Records  rosuvastatin (CRESTOR) 5 MG tablet [id number] Yes TAKE 1 TABLET (5 MG TOTAL) BY MOUTH EVERY EVENING. FOR CHOLESTEROL. Doreene Nest, NP Taking Active Child, Pharmacy Records    Discontinued 01/02/20 1710             Home Care and Equipment/Supplies: Were Home Health Services Ordered?: Yes Name of Home Health Agency:: [facility] Has Agency set up a time to come to your home?: No EMR reviewed for Home Health Orders: Orders present/patient has not received call (refer to CM for follow-up) (Daughter has the number) Any new equipment or medical supplies ordered?: Yes Name of Medical supply agency?: [facility] Were you able to get the equipment/medical supplies?: Yes Do you have any questions related to the use of the equipment/supplies?: No  Functional Questionnaire: Do you need assistance with bathing/showering or dressing?: Yes Do you need assistance with meal preparation?: Yes Do you need assistance with eating?: No Do you have difficulty maintaining continence: No Do you need assistance with getting out of bed/getting out of a chair/moving?: No Do you have difficulty managing or taking your medications?: Yes  Follow up appointments reviewed: PCP Follow-up appointment confirmed?: Yes Date of PCP follow-up appointment?: 08/24/23 Follow-up Provider: Vernona Rieger Specialist [facility] Follow-up appointment confirmed?: Yes Date of Specialist follow-up appointment?: 08/23/23 Follow-Up Specialty Provider:: Dr Tat Neurology Do you need transportation to your follow-up  appointment?: No Do you understand care options if your condition(s) worsen?: Yes-patient verbalized understanding  SDOH Interventions Today    Flowsheet Row Most Recent Value  SDOH Interventions   Food Insecurity Interventions Intervention Not Indicated  Housing Interventions Intervention Not Indicated  Transportation Interventions Intervention Not Indicated, Patient Resources (Friends/Family)      Interventions Today    Flowsheet Row Most Recent Value  Chronic Disease   Chronic disease during today's visit Other  [Seizures]  General Interventions   General Interventions Discussed/Reviewed General Interventions Discussed, General Interventions Reviewed, Doctor Visits  [RN reiterated the [facility] meal Plan]  Doctor Visits Discussed/Reviewed Doctor Visits Discussed, Doctor Visits Reviewed, PCP, Specialist  PCP/Specialist Visits Compliance with follow-up visit  Nutrition Interventions   Nutrition Discussed/Reviewed Nutrition Discussed  Pharmacy Interventions   Pharmacy Dicussed/Reviewed Pharmacy Topics Discussed, Pharmacy Topics Reviewed      Patient daughter declined Case Care Manager follow up  Gean Maidens BSN RN [facility] Health Care Management Coordinator [email or web address] Direct Dial: [phone]  Fax: [phone] Website: [email or web address]

## 2023-08-22 NOTE — Progress Notes (Unsigned)
Assessment/Plan:   1.  Parkinsons Disease             -Continue carbidopa/levodopa 25/100, 3 tablets 3 times per day    2.  Depression             -On Lexapro             -On mirtazapine             -Managed by PCP   3.  RBD and GAD             -On clonazepam 0.5 mg, half tablet at bedtime and will take half tablet as needed in the day for anxiety.             -PDMP reviewed.  No red flags.   4.  Chronic headache             -On gabapentin, 100 mg 3 times per day.             -Saw Dr. Zachery Conch in the remote past.   5.  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             -Patient last had neurocognitive testing in August, 2021 with Dr. Roseanne Reno.   -she thought that low dose donepezil 5 mg caused eds  -add memantine and work to 10 mg bid 6.  Vasovagal syncope  -This was in the setting of having a bowel movement in November, 2024 7.  seizure  -Patient did have a seizure many years ago (possibly a decade) in the setting of Wellbutrin  -Patient was back in the emergency room January, 2025 with several episodes of seizure.  The first was felt due to possible benzodiazepine withdrawal, as UDS was negative for benzodiazepine even though patient had stated she was compliant with clonazepam.  She had events after this.  -Continue Keppra, 500 mg twice per day.  -Seizure and safety discussed.  -If events continue, we will admit to EMU  Subjective:   [name] was seen today in follow up for Parkinsons disease.  My previous records were reviewed prior to todays visit as well as outside records available to me.  Much has happened since our next visit and numerous records are reviewed.  Patient presented to the hospital early November with near syncope.  This was felt vasovagal, in the setting of having a bowel movement.  Patient did not quite make it to the bathroom, however.  Emergency room workup was essentially unremarkable and she was discharged home.  Patient was back in the  hospital for an admission December 4.  She presented with generalized weakness and confusion, found secondary to acute cystitis.  Patient was treated with antibiotics and discharged home several days later.  Patient was back in the emergency room January 13 for possible seizure.  Records indicate that the patient raise the right arm in the air and started shaking it, but she was able to maintain standing until her daughter lowered her to the ground, after which the patient seemed to lose consciousness for a few minutes.  The only other history of seizure prior to test was many years ago, and was associated with Wellbutrin.  CT brain was unremarkable in the emergency room.  She was discharged, with follow-up appointment made for our office for January 17.  It appears that she did go to [facility] on January 17 sometime in the late afternoon and stated that she was on her  way to [facility] and had 2 witnessed seizures that lasted about 5 seconds, but I wonder if the patient was left before being seen, because there was no notes other than the triage notes.  She was hospitalized again January 22.  She was found on the ground next to her bed with the right arm above her head shaking.  There was some question of whether or not there was benzodiazepine withdrawal, as her lab test was negative for benzodiazepines and she was on scheduled clonazepam.  Initial EEG demonstrated mild to moderate diffuse slowing and excess beta due to benzodiazepine use.  Overnight EEG continued to demonstrate continuous generalized slowing with excess beta.  Keppra was initiated, although no ictal/interictal activity was ever noted.  She was started initially on 250 mg twice per day.  She was discharged on January 24.  She returned to the emergency room on January 26 with possible seizure x 2.  Patient's clonazepam was increased to 500 mg twice per day.  She was discharged from the emergency room.  MRI brain with and without on August 18, 2023 was negative.  Current prescribed movement disorder medications:  Carbidopa/levodopa 25/100, 3 tablets 3 times per day Mirtazapine, 30 mg at night Gabapentin, 100 mg 3 times per day Clonazepam, 0.25 mg twice daily   Prior meds: aricept, 5 mg (tired and pt d/c)    ALLERGIES:   Allergies  Allergen Reactions   Codeine Sulfate     REACTION: hallucinations   Penicillins Anaphylaxis, Swelling and Rash    Tolerates Rocephin   Amoxapine And Related    Wellbutrin [Bupropion] Other (See Comments)    Seizures and hallucinations    CURRENT MEDICATIONS:  Outpatient Encounter Medications as of 08/23/2023  Medication Sig   ACCU-CHEK GUIDE test strip USE UP TO 4 TIMES DAILY AS DIRECTED   Accu-Chek Softclix Lancets lancets USE UP TO 4 TIMES DAILY AS DIRECTED   acetaminophen (TYLENOL) 500 MG tablet Take 500 mg by mouth every 6 (six) hours as needed for moderate pain (pain score 4-6) or mild pain (pain score 1-3).   aspirin 81 MG tablet Take 81 mg by mouth daily.   blood glucose meter kit and supplies KIT Dispense based on patient and insurance preference. Use up to four times daily as directed. (FOR ICD-9 250.00, 250.01).   calcium carbonate (TUMS EX) 750 MG chewable tablet Chew 1 tablet by mouth as needed.  (Patient not taking: Reported on 08/17/2023)   carbidopa-levodopa (SINEMET IR) 25-100 MG tablet TAKE 3 TABLETS BY MOUTH 3 TIMES DAILY. (Patient taking differently: Take 3 tablets by mouth 3 (three) times daily.)   cetirizine (ZYRTEC) 10 MG tablet Take 10 mg by mouth at bedtime.   Cholecalciferol (VITAMIN D3) 1000 UNITS CAPS Take 1 capsule by mouth daily.   clonazePAM (KLONOPIN) 0.5 MG tablet TAKE 1/2 TABLET BY MOUTH AT BEDTIME AND 1/2 DAILY AS NEEDED (Patient taking differently: Take 0.25 mg by mouth 2 (two) times daily.)   donepezil (ARICEPT) 5 MG tablet TAKE 1 TABLET BY MOUTH EVERYDAY AT BEDTIME (Patient taking differently: Take 5 mg by mouth at bedtime.)   escitalopram (LEXAPRO) 10 MG  tablet TAKE 1 TABLET BY MOUTH EVERY DAY (Patient taking differently: Take 10 mg by mouth in the morning.)   gabapentin (NEURONTIN) 100 MG capsule TAKE 1 CAPSULE BY MOUTH THREE TIMES A DAY (Patient taking differently: Take 100 mg by mouth 3 (three) times daily.)   levETIRAcetam (KEPPRA) 500 MG tablet Take 1 tablet (500 mg  total) by mouth 2 (two) times daily.   memantine (NAMENDA) 10 MG tablet Take 1 tablet (10 mg total) by mouth 2 (two) times daily.   mirtazapine (REMERON) 30 MG tablet TAKE 1 TABLET (30 MG TOTAL) BY MOUTH AT BEDTIME. FOR SLEEP.   montelukast (SINGULAIR) 10 MG tablet TAKE 1 TABLET (10 MG TOTAL) BY MOUTH AT BEDTIME. FOR ALLERGIES   rosuvastatin (CRESTOR) 5 MG tablet TAKE 1 TABLET (5 MG TOTAL) BY MOUTH EVERY EVENING. FOR CHOLESTEROL.   [DISCONTINUED] simvastatin (ZOCOR) 20 MG tablet TAKE 1 TABLET BY MOUTH EVERY DAY AT BEDTIME FOR CHOLESTEROL   No facility-administered encounter medications on file as of 08/23/2023.    Objective:   PHYSICAL EXAMINATION:    VITALS:   There were no vitals filed for this visit.     GEN:  The patient appears stated age and is in NAD. HEENT:  Normocephalic, atraumatic.  The mucous membranes are moist. The superficial temporal arteries are without ropiness or tenderness. CV:  RRR Lungs:  CTAB but there is upper airway noises   Neurological examination:  Orientation: The patient is alert and oriented x3. Cranial nerves: There is good facial symmetry with minimal facial hypomimia. The speech is fluent and clear. Soft palate rises symmetrically and there is no tongue deviation. Hearing is decreased  to conversational tone. Sensation: Sensation is intact to light touch throughout Motor: Strength is at least antigravity x4.  Movement examination: Tone: There is nl tone in the UE/LE Abnormal movements: none Coordination:  There is no decremation with any form of RAMS, including alternating supination and pronation of the forearm, hand opening and  closing, finger taps, heel taps and toe taps.  Gait and Station: Patient pushes off of the chair to arise. The patient's stride length is slow and tenuous and a bit unstable (similar to last visit) I have reviewed and interpreted the following labs independently    Chemistry      Component Value Date/Time   NA 138 08/21/2023 1530   K 3.6 08/21/2023 1530   CL 99 08/21/2023 1530   CO2 24 08/21/2023 1530   BUN 16 08/21/2023 1530   CREATININE 0.80 08/21/2023 1530   CREATININE 0.69 09/03/2022 1614      Component Value Date/Time   CALCIUM 9.2 08/21/2023 1530   ALKPHOS 78 08/17/2023 1155   AST 10 (L) 08/17/2023 1155   ALT <5 08/17/2023 1155   BILITOT 0.7 08/17/2023 1155       Lab Results  Component Value Date   WBC 6.7 08/21/2023   HGB 12.5 08/21/2023   HCT 38.8 08/21/2023   MCV 90.2 08/21/2023   PLT 146 (L) 08/21/2023    Lab Results  Component Value Date   TSH 3.071 06/30/2023     Total time spent on today's visit was *** minutes, including both face-to-face time and nonface-to-face time.  Time included that spent on review of records (prior notes available to me/labs/imaging if pertinent), discussing treatment and goals, answering patient's questions and coordinating care.  Cc:  Doreene Nest, NP

## 2023-08-23 ENCOUNTER — Encounter: Payer: Self-pay | Admitting: Neurology

## 2023-08-23 ENCOUNTER — Ambulatory Visit (INDEPENDENT_AMBULATORY_CARE_PROVIDER_SITE_OTHER): Payer: Medicare Other | Admitting: Neurology

## 2023-08-23 VITALS — BP 106/70 | HR 76

## 2023-08-23 DIAGNOSIS — G20A1 Parkinson's disease without dyskinesia, without mention of fluctuations: Secondary | ICD-10-CM

## 2023-08-23 DIAGNOSIS — R569 Unspecified convulsions: Secondary | ICD-10-CM | POA: Diagnosis not present

## 2023-08-23 DIAGNOSIS — F418 Other specified anxiety disorders: Secondary | ICD-10-CM | POA: Diagnosis not present

## 2023-08-23 MED ORDER — CLONAZEPAM 0.5 MG PO TABS: ORAL_TABLET | ORAL | Status: DC

## 2023-08-23 NOTE — Patient Instructions (Addendum)
Decrease your clonazepam to just 1/2 tablet at bedtime.  The daytime dosage can cause confusion and falls.  Continue Keppra 500 mg twice per day  The physicians and staff at [facility] are committed to providing excellent care. You may receive a survey requesting feedback about your experience at our office. We strive to receive "very good" responses to the survey questions. If you feel that your experience would prevent you from giving the office a "very good " response, please contact our office to try to remedy the situation. We may be reached at [phone]. Thank you for taking the time out of your busy day to complete the survey.

## 2023-08-24 ENCOUNTER — Encounter: Payer: Self-pay | Admitting: Primary Care

## 2023-08-24 ENCOUNTER — Ambulatory Visit (INDEPENDENT_AMBULATORY_CARE_PROVIDER_SITE_OTHER): Payer: Medicare Other | Admitting: Primary Care

## 2023-08-24 VITALS — BP 132/76 | HR 79 | Temp 97.5°F

## 2023-08-24 DIAGNOSIS — G9341 Metabolic encephalopathy: Secondary | ICD-10-CM | POA: Diagnosis not present

## 2023-08-24 DIAGNOSIS — F341 Dysthymic disorder: Secondary | ICD-10-CM | POA: Diagnosis not present

## 2023-08-24 DIAGNOSIS — R569 Unspecified convulsions: Secondary | ICD-10-CM

## 2023-08-24 DIAGNOSIS — R400 Somnolence: Secondary | ICD-10-CM | POA: Insufficient documentation

## 2023-08-24 NOTE — Assessment & Plan Note (Signed)
Weaning off mirtazapine.  Reduce to 15 mg nightly x 1 week, then 15 mg every other night x 1 week, then stop. Her daughter will closely update.

## 2023-08-24 NOTE — Assessment & Plan Note (Signed)
Working to wean off medications that could potentially cause confusion/drowsiness.  Reduce gabapentin to 100 mg twice daily x 1 week, then 100 mg daily x 1 week, then stop.  Reduce mirtazapine to 15 mg at bedtime x 1 week, then to 2 mg every other evening x 1 week, then stop.  Daughter will update.

## 2023-08-24 NOTE — Progress Notes (Signed)
Subjective:    Patient ID: [name], female    DOB: Sep 14, 1945, 78 y.o.   MRN: [id number]  HPI  [name] is a very pleasant 78 y.o. female with a history of Parkinson's disease, type 2 diabetes, hypertension, osteoporosis, hyperlipidemia, seizures, orthostasis who presents today for hospital follow-up.  Originally evaluated at [facility] ED on 08/08/2023 for seizure-like activity.  Workup was reassuring so she was discharged home.  She presented to [facility], ED on 08/17/2023 for seizure-like activities with symptoms of laying next to her bed on the ground with her right arm above her head shaking with respiratory noises, postictal.  CT head, maxillofacial, cervical spine were negative.  Neurology was consulted who recommended admission including MRI of the brain.  During her hospital stay neurology consulted who recommended EEG, cardiology consult, seizure precautions.  EEG revealed mild to moderate diffuse encephalopathy with excessive beta activity which was suspected to be secondary to benzodiazepine withdrawal.  She was initiated on Keppra 250 mg twice daily for likely seizure activity.  She was discharged home on 08/19/2023 with recommendations for outpatient neurology follow-up.   She presented to [facility] ED on 08/21/2023 for seizures despite compliance to Keppra as prescribed during last hospitalization.  Neurology was consulted who recommended increased dose of Keppra to 500 mg twice daily and discharged home with close follow-up and home health PT.  Evaluated by neurology, Dr. Arbutus Leas, yesterday for general follow-up and hospital follow-up.  Her clonazepam was reduced to 0.25 mg at bedtime.  Keppra 500 mg twice daily was continued.  All other medications were continued.  Since her hospital discharge she's feeling okay. Since November 2024 she's become more tired and drowsy. Her daughter discusses that over the last 1 month she's noticed a rapid decline in her memory, the addition of  hallucinations, and is now sleeping a lot during the day. There have been no seizures since 08/21/23. Her daughter believes her seizures were induced by the direct sunlight. They have blackout curtains in their home and she wears dark glasses when in the car  She has been contacted by home health PT, she is arranging visits now.  Her daughter does not believe that she needs gabapentin as she denies pain and is not management for seizure treatment.  She also does not believe that she needs medication to help her sleep as she sleeps well already.  BP Readings from Last 3 Encounters:  08/24/23 132/76  08/23/23 106/70  08/21/23 (!) 141/68     Review of Systems  Respiratory:  Negative for shortness of breath.   Cardiovascular:  Negative for chest pain.  Neurological:  Negative for seizures.  Psychiatric/Behavioral:  Positive for confusion and hallucinations. Negative for sleep disturbance. The patient is not nervous/anxious.          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   Really didn't use.   Weight loss 46 pounds since last visit.  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    Social History   Socioeconomic History   Marital status: Widowed    Spouse name: Not on file   Number of children: 2   Years of education: Not on file   Highest education level: Some college, no degree  Occupational History   Occupation: Restaurant manager, fast food: [employer]    Comment: retired, 04/2012  Tobacco Use   Smoking status: Never   Smokeless tobacco: Never  Vaping Use   Vaping status: Never Used  Substance and Sexual Activity   Alcohol use: No    Comment: none   Drug use: No   Sexual activity: Not Currently  Other Topics Concern   Not on file  Social History Narrative   Right handed   One story home   Lives with daughter and granddaughter.   Daughter is HPOA.   DNR.   Social Drivers of Corporate investment banker Strain: Low Risk  (06/10/2023)   Overall Financial Resource Strain (CARDIA)    Difficulty of Paying Living Expenses: Not hard at all  Food Insecurity: No Food Insecurity (08/22/2023)   Hunger Vital Sign    Worried About Running Out of Food in the Last Year: Never true    Ran Out of Food in the Last Year: Never true  Transportation Needs: No Transportation Needs (08/22/2023)   PRAPARE - Administrator, Civil Service (Medical): No    Lack of Transportation (Non-Medical): No  Physical Activity: Inactive (06/10/2023)   Exercise Vital Sign     Days of Exercise per Week: 0 days    Minutes of Exercise per Session: 0 min  Stress: No Stress Concern Present (06/10/2023)   [employer] of Occupational Health - Occupational Stress Questionnaire    Feeling of Stress : Not at all  Social Connections: Socially Isolated (08/18/2023)   Social Connection and Isolation Panel [NHANES]    Frequency of Communication with Friends and Family: Three times a week    Frequency of Social Gatherings with Friends and Family: More than three times a week    Attends Religious Services: Never    Database administrator or Organizations: No    Attends Banker Meetings: Never    Marital Status: Widowed  Intimate Partner Violence: Not At Risk (08/22/2023)   Humiliation, Afraid, Rape, and Kick questionnaire    Fear of Current or Ex-Partner: No    Emotionally Abused: No    Physically Abused: No  Sexually Abused: No    Past Surgical History:  Procedure Laterality Date   bladder tack  11/1995   BREAST BIOPSY  05/20/2000   bilaterally   BREAST CYST ASPIRATION  12/1998   bilaterally   CARDIAC CATHETERIZATION  2004   CATARACT EXTRACTION W/ INTRAOCULAR LENS  IMPLANT, BILATERAL     FRACTURE SURGERY  02/09/2006   bilateral elbows   FRACTURE SURGERY     right knee   FRACTURE SURGERY     tib plateau   KNEE ARTHROSCOPY  12/12/2002   right    LAPAROSCOPIC GASTRIC BANDING  2008   SEPTOPLASTY  08/1998   with antral window    TONSILLECTOMY AND ADENOIDECTOMY  1970   TUBAL LIGATION  1970's    Family History  Problem Relation Age of Onset   Asthma Mother    COPD Father    Heart disease Father    Diabetes Sister    Vision loss Sister        legally blind   Hypertension Child    Hyperlipidemia Child    Diabetes Child    Diabetes Son    Breast cancer Neg Hx     Allergies  Allergen Reactions   Codeine Sulfate     REACTION: hallucinations   Penicillins Anaphylaxis, Swelling and Rash    Tolerates Rocephin   Amoxapine And  Related    Wellbutrin [Bupropion] Other (See Comments)    Seizures and hallucinations    Current Outpatient Medications on File Prior to Visit  Medication Sig Dispense Refill   ACCU-CHEK GUIDE test strip USE UP TO 4 TIMES DAILY AS DIRECTED 300 strip 1   Accu-Chek Softclix Lancets lancets USE UP TO 4 TIMES DAILY AS DIRECTED 200 each 1   acetaminophen (TYLENOL) 500 MG tablet Take 500 mg by mouth every 6 (six) hours as needed for moderate pain (pain score 4-6) or mild pain (pain score 1-3).     aspirin 81 MG tablet Take 81 mg by mouth daily.     blood glucose meter kit and supplies KIT Dispense based on patient and insurance preference. Use up to four times daily as directed. (FOR ICD-9 250.00, 250.01). 1 each 0   calcium carbonate (TUMS EX) 750 MG chewable tablet Chew 1 tablet by mouth as needed.     carbidopa-levodopa (SINEMET IR) 25-100 MG tablet TAKE 3 TABLETS BY MOUTH 3 TIMES DAILY. (Patient taking differently: Take 3 tablets by mouth 3 (three) times daily.) 810 tablet 0   cetirizine (ZYRTEC) 10 MG tablet Take 10 mg by mouth at bedtime.     Cholecalciferol (VITAMIN D3) 1000 UNITS CAPS Take 1 capsule by mouth daily.     clonazePAM (KLONOPIN) 0.5 MG tablet TAKE 1/2 TABLET BY MOUTH AT BEDTIME AND 1/2 DAILY AS NEEDED (Patient taking differently: TAKE 1/2 TABLET BY MOUTH AT BEDTIME)     donepezil (ARICEPT) 5 MG tablet TAKE 1 TABLET BY MOUTH EVERYDAY AT BEDTIME 90 tablet 0   escitalopram (LEXAPRO) 10 MG tablet TAKE 1 TABLET BY MOUTH EVERY DAY (Patient taking differently: Take 10 mg by mouth in the morning.) 90 tablet 0   levETIRAcetam (KEPPRA) 500 MG tablet Take 1 tablet (500 mg total) by mouth 2 (two) times daily. 60 tablet 0   memantine (NAMENDA) 10 MG tablet Take 1 tablet (10 mg total) by mouth 2 (two) times daily. 180 tablet 1   montelukast (SINGULAIR) 10 MG tablet TAKE 1 TABLET (10 MG TOTAL) BY MOUTH AT BEDTIME. FOR ALLERGIES 90 tablet  3   rosuvastatin (CRESTOR) 5 MG tablet TAKE 1 TABLET (5 MG  TOTAL) BY MOUTH EVERY EVENING. FOR CHOLESTEROL. 90 tablet 3   [DISCONTINUED] simvastatin (ZOCOR) 20 MG tablet TAKE 1 TABLET BY MOUTH EVERY DAY AT BEDTIME FOR CHOLESTEROL 90 tablet 0   No current facility-administered medications on file prior to visit.    BP 132/76   Pulse 79   Temp (!) 97.5 F (36.4 C) (Temporal)   SpO2 97%  Objective:   Physical Exam Cardiovascular:     Rate and Rhythm: Normal rate and regular rhythm.  Pulmonary:     Effort: Pulmonary effort is normal.     Breath sounds: Normal breath sounds.  Musculoskeletal:     Cervical back: Neck supple.  Skin:    General: Skin is warm and dry.  Neurological:     Mental Status: She is alert.     Comments: Answers some questions appropriately, heavily relies on her daughter to provide answers during HPI.  Follows commands.  Psychiatric:        Mood and Affect: Mood normal.           Assessment & Plan:  Seizures (HCC) Assessment & Plan: With recent hospitalization and multiple ED visits. Hospital labs, imaging, notes reviewed.  Reviewed neurology visit from yesterday.  Continue Keppra 500 mg twice daily.  Will work to wean off some of the unnecessary medications on her medication list due to drowsiness and sleepiness.   Acute metabolic encephalopathy Assessment & Plan: With recent hospitalization and multiple ED visits. Hospital notes, labs, imaging reviewed.  Will work to reduce some of her unnecessary medications that could be contributing to confusion/drowsiness. She and her daughter agree.  Slowly wean off of mirtazapine.  Reduce gabapentin to 100 mg twice daily x 1 week, then 100 mg daily x 1 week, then stop. Her daughter will update.   Dysthymic disorder Assessment & Plan: Weaning off mirtazapine.  Reduce to 15 mg nightly x 1 week, then 15 mg every other night x 1 week, then stop. Her daughter will closely update.   Drowsiness Assessment & Plan: Working to wean off medications that  could potentially cause confusion/drowsiness.  Reduce gabapentin to 100 mg twice daily x 1 week, then 100 mg daily x 1 week, then stop.  Reduce mirtazapine to 15 mg at bedtime x 1 week, then to 2 mg every other evening x 1 week, then stop.  Daughter will update.         Doreene Nest, NP

## 2023-08-24 NOTE — Patient Instructions (Addendum)
Cut the mirtazapine sleeping pill in half. Take 1/2 tablet nightly for 1 week, then 1/2 tablet every other night for 1 week, then stop.  Wean off gabapentin. Take 1 capsule by mouth twice daily for 1 week, then 1 capsule once daily for 1 week, then stop.  Let me know about the Purewick.   It was a pleasure to see you today!

## 2023-08-24 NOTE — Assessment & Plan Note (Addendum)
With recent hospitalization and multiple ED visits. Hospital notes, labs, imaging reviewed.  Will work to reduce some of her unnecessary medications that could be contributing to confusion/drowsiness. She and her daughter agree.  Slowly wean off of mirtazapine.  Reduce gabapentin to 100 mg twice daily x 1 week, then 100 mg daily x 1 week, then stop. Her daughter will update.

## 2023-08-24 NOTE — Assessment & Plan Note (Signed)
With recent hospitalization and multiple ED visits. Hospital labs, imaging, notes reviewed.  Reviewed neurology visit from yesterday.  Continue Keppra 500 mg twice daily.  Will work to wean off some of the unnecessary medications on her medication list due to drowsiness and sleepiness.

## 2023-08-25 ENCOUNTER — Ambulatory Visit: Payer: Medicare Other | Admitting: Neurology

## 2023-08-26 ENCOUNTER — Ambulatory Visit: Payer: Medicare Other | Admitting: Primary Care

## 2023-08-26 ENCOUNTER — Encounter: Payer: Medicare Other | Admitting: Psychology

## 2023-10-04 ENCOUNTER — Telehealth: Payer: Self-pay | Admitting: Neurology

## 2023-10-04 ENCOUNTER — Other Ambulatory Visit: Payer: Self-pay

## 2023-10-04 DIAGNOSIS — G20A1 Parkinson's disease without dyskinesia, without mention of fluctuations: Secondary | ICD-10-CM

## 2023-10-04 DIAGNOSIS — R569 Unspecified convulsions: Secondary | ICD-10-CM

## 2023-10-04 MED ORDER — LEVETIRACETAM 500 MG PO TABS: 500.0000 mg | ORAL_TABLET | Freq: Two times a day (BID) | ORAL | 0 refills | Status: DC

## 2023-10-04 NOTE — Telephone Encounter (Signed)
 Pt needs refill on 500mg  keppra [facility] [place] [address]

## 2023-10-04 NOTE — Telephone Encounter (Signed)
 Dr. Arbutus Leas I do not see that you prescribe this keppra

## 2023-10-07 ENCOUNTER — Telehealth: Payer: Self-pay | Admitting: Neurology

## 2023-10-07 ENCOUNTER — Other Ambulatory Visit: Payer: Self-pay

## 2023-10-07 MED ORDER — LEVETIRACETAM 250 MG PO TABS: 250.0000 mg | ORAL_TABLET | Freq: Two times a day (BID) | ORAL | 0 refills | Status: DC

## 2023-10-07 NOTE — Telephone Encounter (Signed)
 Confirmed 500 mg BID

## 2023-10-07 NOTE — Telephone Encounter (Signed)
 I sent this 250mg   in addition to her 500 mg Keppra BID for a total of 750mg . Patient had just picked up 500 mg BID

## 2023-10-07 NOTE — Telephone Encounter (Signed)
 Pt c/o: seizure Missed medications?  No. Sleep deprived?  Yes.   Alcohol intake?  No. Increased stress? No. Any change in medication color or shape? No. Back to their usual baseline self?  Yes.  . If no, advise go to ER Current medications prescribed by Dr.  Arbutus Leas Keppra 500 mg 2 tabs bid   Spoke to patietns daughter she does not know how long the seizure lasted but when she heard a sound she went in the room and her moms hands were balled up and she was just shaking a little. Her mom was very tired after the seizure but she is is back to basline at this time

## 2023-10-07 NOTE — Telephone Encounter (Signed)
 Patient daughter called and states that she wants [place] to know that the patient had a seizure late yesterday afternoon

## 2023-10-10 ENCOUNTER — Other Ambulatory Visit: Payer: Self-pay

## 2023-10-10 ENCOUNTER — Telehealth: Payer: Self-pay | Admitting: Neurology

## 2023-10-10 ENCOUNTER — Emergency Department (HOSPITAL_COMMUNITY)
Admission: EM | Admit: 2023-10-10 | Discharge: 2023-10-10 | Disposition: A | Discharge status: Home / Self Care | Attending: Emergency Medicine | Admitting: Emergency Medicine

## 2023-10-10 DIAGNOSIS — I959 Hypotension, unspecified: Secondary | ICD-10-CM | POA: Diagnosis not present

## 2023-10-10 DIAGNOSIS — R569 Unspecified convulsions: Secondary | ICD-10-CM | POA: Insufficient documentation

## 2023-10-10 DIAGNOSIS — N390 Urinary tract infection, site not specified: Secondary | ICD-10-CM | POA: Insufficient documentation

## 2023-10-10 DIAGNOSIS — Z7982 Long term (current) use of aspirin: Secondary | ICD-10-CM | POA: Insufficient documentation

## 2023-10-10 DIAGNOSIS — F0390 Unspecified dementia without behavioral disturbance: Secondary | ICD-10-CM | POA: Insufficient documentation

## 2023-10-10 DIAGNOSIS — I10 Essential (primary) hypertension: Secondary | ICD-10-CM | POA: Diagnosis not present

## 2023-10-10 DIAGNOSIS — E119 Type 2 diabetes mellitus without complications: Secondary | ICD-10-CM | POA: Diagnosis not present

## 2023-10-10 LAB — COMPREHENSIVE METABOLIC PANEL
ALT: <5 U/L (ref 0–44)
AST: 7 U/L — ABNORMAL LOW (ref 15–41)
Albumin: 3.2 g/dL — ABNORMAL LOW (ref 3.5–5.0)
Alkaline Phosphatase: 69 U/L (ref 38–126)
Anion gap: 9 (ref 5–15)
BUN: 16 mg/dL (ref 8–23)
CO2: 28 mmol/L (ref 22–32)
Calcium: 9.1 mg/dL (ref 8.9–10.3)
Chloride: 100 mmol/L (ref 98–111)
Creatinine, Ser: 0.52 mg/dL (ref 0.44–1.00)
GFR, Estimated: >60 mL/min (ref >60)
Glucose, Bld: 129 mg/dL — ABNORMAL HIGH (ref 70–99)
Potassium: 3.7 mmol/L (ref 3.5–5.1)
Sodium: 137 mmol/L (ref 135–145)
Total Bilirubin: 1 mg/dL (ref 0.0–1.2)
Total Protein: 5.9 g/dL — ABNORMAL LOW (ref 6.5–8.1)

## 2023-10-10 LAB — URINALYSIS, ROUTINE W REFLEX MICROSCOPIC
Bilirubin Urine: NEGATIVE
Glucose, UA: NEGATIVE mg/dL
Hgb urine dipstick: NEGATIVE
Ketones, ur: 20 mg/dL — AB
Nitrite: NEGATIVE
Protein, ur: NEGATIVE mg/dL
Specific Gravity, Urine: 1.02 (ref 1.005–1.030)
pH: 6 (ref 5.0–8.0)

## 2023-10-10 LAB — CBC WITH DIFFERENTIAL/PLATELET
Abs Immature Granulocytes: 0.12 10*3/uL — ABNORMAL HIGH (ref 0.00–0.07)
Basophils Absolute: 0.1 10*3/uL (ref 0.0–0.1)
Basophils Relative: 1 %
Eosinophils Absolute: 0 10*3/uL (ref 0.0–0.5)
Eosinophils Relative: 0 %
HCT: 37.3 % (ref 36.0–46.0)
Hemoglobin: 11.9 g/dL — ABNORMAL LOW (ref 12.0–15.0)
Immature Granulocytes: 1 %
Lymphocytes Relative: 10 %
Lymphs Abs: 0.9 10*3/uL (ref 0.7–4.0)
MCH: 28.8 pg (ref 26.0–34.0)
MCHC: 31.9 g/dL (ref 30.0–36.0)
MCV: 90.3 fL (ref 80.0–100.0)
Monocytes Absolute: 0.6 10*3/uL (ref 0.1–1.0)
Monocytes Relative: 6 %
Neutro Abs: 7.9 10*3/uL — ABNORMAL HIGH (ref 1.7–7.7)
Neutrophils Relative %: 82 %
Platelets: 187 10*3/uL (ref 150–400)
RBC: 4.13 MIL/uL (ref 3.87–5.11)
RDW: 15.2 % (ref 11.5–15.5)
WBC: 9.6 10*3/uL (ref 4.0–10.5)
nRBC: 0 % (ref 0.0–0.2)

## 2023-10-10 LAB — CBG MONITORING, ED: Glucose-Capillary: 113 mg/dL — ABNORMAL HIGH (ref 70–99)

## 2023-10-10 LAB — LACTIC ACID, PLASMA: Lactic Acid, Venous: 1.2 mmol/L (ref 0.5–1.9)

## 2023-10-10 MED ORDER — CIPROFLOXACIN HCL 500 MG PO TABS
500.0000 mg | ORAL_TABLET | Freq: Once | ORAL | Status: AC
  Administered 2023-10-10: 500 mg via ORAL
  Filled 2023-10-10: qty 1

## 2023-10-10 MED ORDER — CIPROFLOXACIN HCL 500 MG PO TABS: 500.0000 mg | ORAL_TABLET | Freq: Two times a day (BID) | ORAL | 0 refills | Status: DC

## 2023-10-10 MED ORDER — SODIUM CHLORIDE 0.9 % IV BOLUS
1000.0000 mL | Freq: Once | INTRAVENOUS | Status: AC
  Administered 2023-10-10: 1000 mL via INTRAVENOUS

## 2023-10-10 MED ORDER — LEVETIRACETAM IN NACL 1500 MG/100ML IV SOLN
1500.0000 mg | Freq: Once | INTRAVENOUS | Status: AC
  Administered 2023-10-10: 1500 mg via INTRAVENOUS
  Filled 2023-10-10: qty 100

## 2023-10-10 NOTE — Discharge Instructions (Signed)
 [name] was seen in the emergency department after having a seizure We gave her IV Keppra here She did not have any more seizures We did talk to her neurology office and she should be seen as an outpatient in the office within the next week for reevaluation Please continue on the increased dose of Keppra that was previously directed Her urine does show evidence of urinary tract infection For this reason we gave her first dose of antibiotic here in the ED and have called the rest into her [facility] pharmacy to pick up to treat the infection We are sending the urine for culture to see which bacteria grows Return to the emergency department for recurrent seizure activity or any other concerns

## 2023-10-10 NOTE — Telephone Encounter (Signed)
 Pt's daughter called in stating she got a message to come in on 10/13/23. She stated her mother is in an ambulance now going to [facility] right now. She is hoping they are going to keep her so she won't be able to come on 10/13/23.

## 2023-10-10 NOTE — Telephone Encounter (Signed)
 Caller stated patient had 2 seizures this morning, one around 8:00/8:30 AM and another about 30 minutes ago. 911 was called

## 2023-10-10 NOTE — Telephone Encounter (Signed)
 Spoke to ER Dr. Elayne Snare, patient is back to baseline but had 2 seizures earlier today. Given 1500mg  IV Keppra, continue 750mg  BID (recently increased). He mentioned having EEG done as outpatient, will confirm with Dr. Arbutus Leas, but phone notes indicate f/u for 3/20.

## 2023-10-10 NOTE — ED Notes (Signed)
 Wallace Cullens top was not collected earlier- just now sent to lab for lactic acid

## 2023-10-10 NOTE — ED Triage Notes (Signed)
 BIBA from home for 2 seizures today, last one at 1400. Keppra dosage was increased recently. Hx of dementia, pt is currently at baseline 138/82 BP 74 HR 98% room air  143 cbg

## 2023-10-10 NOTE — ED Provider Notes (Signed)
 [place] EMERGENCY DEPARTMENT AT [facility] Provider Note   CSN: [id number] Arrival date & time: 10/10/23  1453     History  Chief Complaint  Patient presents with   Seizures    [name] is a 78 y.o. female.  Has a history of seizure disorder, dementia, type 2 diabetes and recurrent UTIs who presents to ED after seizure.  Patient has a known seizure disorder and is followed by [facility] neurology (Dr.Tat).  She had a seizure 5 days ago and for this reason her Keppra dosage was increased.  She lives with her daughter who has been administering her medications as directed.  Today she experienced another 2 episodes of seizures witnessed by the daughter.  Each 1 lasted several minutes and was proceeded with blank staring and then subsequent tonic-clonic convulsive activity.  Daughter states she is mostly back to her neurologic baseline but may be a little more confused.  No fevers chills cough congestion.  Patient unable to provide additional history secondary to dementia   Seizures      Home Medications Prior to Admission medications   Medication Sig Start Date End Date Taking? Authorizing Provider  ciprofloxacin (CIPRO) 500 MG tablet Take 1 tablet (500 mg total) by mouth every 12 (twelve) hours. 10/10/23  Yes Royanne Foots, DO  ACCU-CHEK GUIDE test strip USE UP TO 4 TIMES DAILY AS DIRECTED 11/14/22   Doreene Nest, NP  Accu-Chek Softclix Lancets lancets USE UP TO 4 TIMES DAILY AS DIRECTED 07/25/22   Doreene Nest, NP  acetaminophen (TYLENOL) 500 MG tablet Take 500 mg by mouth every 6 (six) hours as needed for moderate pain (pain score 4-6) or mild pain (pain score 1-3).    [provider]  aspirin 81 MG tablet Take 81 mg by mouth daily.    [provider]  blood glucose meter kit and supplies KIT Dispense based on patient and insurance preference. Use up to four times daily as directed. (FOR ICD-9 250.00, 250.01). 04/15/22   Doreene Nest, NP  calcium carbonate (TUMS EX) 750 MG chewable tablet Chew 1 tablet by mouth as needed.    [provider]  carbidopa-levodopa (SINEMET IR) 25-100 MG tablet TAKE 3 TABLETS BY MOUTH 3 TIMES DAILY. Patient taking differently: Take 3 tablets by mouth 3 (three) times daily. 05/24/23   Tat, Octaviano Batty, DO  cetirizine (ZYRTEC) 10 MG tablet Take 10 mg by mouth at bedtime.    [provider]  Cholecalciferol (VITAMIN D3) 1000 UNITS CAPS Take 1 capsule by mouth daily.    [provider]  clonazePAM (KLONOPIN) 0.5 MG tablet TAKE 1/2 TABLET BY MOUTH AT BEDTIME AND 1/2 DAILY AS NEEDED Patient taking differently: TAKE 1/2 TABLET BY MOUTH AT BEDTIME 08/23/23   Tat, Rebecca S, DO  donepezil (ARICEPT) 5 MG tablet TAKE 1 TABLET BY MOUTH EVERYDAY AT BEDTIME 06/20/23   Tat, Rebecca S, DO  escitalopram (LEXAPRO) 10 MG tablet TAKE 1 TABLET BY MOUTH EVERY DAY Patient taking differently: Take 10 mg by mouth in the morning. 06/20/23   Tat, Octaviano Batty, DO  levETIRAcetam (KEPPRA) 250 MG tablet Take 1 tablet (250 mg total) by mouth 2 (two) times daily. 10/07/23   Tat, Octaviano Batty, DO  levETIRAcetam (KEPPRA) 500 MG tablet Take 1 tablet (500 mg total) by mouth 2 (two) times daily. 10/04/23   Tat, Octaviano Batty, DO  memantine (NAMENDA) 10 MG tablet Take 1 tablet (10 mg total) by mouth 2 (  two) times daily. 01/07/23   Tat, Octaviano Batty, DO  montelukast (SINGULAIR) 10 MG tablet TAKE 1 TABLET (10 MG TOTAL) BY MOUTH AT BEDTIME. FOR ALLERGIES 05/01/23   Doreene Nest, NP  rosuvastatin (CRESTOR) 5 MG tablet TAKE 1 TABLET (5 MG TOTAL) BY MOUTH EVERY EVENING. FOR CHOLESTEROL. 05/01/23   Doreene Nest, NP  simvastatin (ZOCOR) 20 MG tablet TAKE 1 TABLET BY MOUTH EVERY DAY AT BEDTIME FOR CHOLESTEROL 10/01/19 01/02/20  Doreene Nest, NP      Allergies    Codeine sulfate, Penicillins, Amoxapine and related, and Wellbutrin [bupropion]    Review of Systems   Review of Systems  Neurological:  Positive  for seizures.    Physical Exam Updated Vital Signs BP 138/63   Pulse 61   Temp 99 F (37.2 C) (Oral)   Resp (!) 27   Ht 5\' 2"  (1.575 m)   Wt 63 kg   SpO2 98%   BMI 25.40 kg/m  Physical Exam Vitals and nursing note reviewed.  HENT:     Head: Normocephalic and atraumatic.     Mouth/Throat:     Comments: No tongue biting Eyes:     Pupils: Pupils are equal, round, and reactive to light.  Cardiovascular:     Rate and Rhythm: Normal rate and regular rhythm.  Pulmonary:     Effort: Pulmonary effort is normal.     Breath sounds: Normal breath sounds.  Abdominal:     Palpations: Abdomen is soft.     Tenderness: There is no abdominal tenderness.  Skin:    General: Skin is warm and dry.  Neurological:     General: No focal deficit present.     Mental Status: She is alert.     Sensory: No sensory deficit.     Motor: No weakness.     Comments: Oriented to self only, baseline  Psychiatric:        Mood and Affect: Mood normal.     ED Results / Procedures / Treatments   Labs (all labs ordered are listed, but only abnormal results are displayed) Labs Reviewed  COMPREHENSIVE METABOLIC PANEL - Abnormal; Notable for the following components:      Result Value   Glucose, Bld 129 (*)    Total Protein 5.9 (*)    Albumin 3.2 (*)    AST 7 (*)    All other components within normal limits  CBC WITH DIFFERENTIAL/PLATELET - Abnormal; Notable for the following components:   Hemoglobin 11.9 (*)    Neutro Abs 7.9 (*)    Abs Immature Granulocytes 0.12 (*)    All other components within normal limits  URINALYSIS, ROUTINE W REFLEX MICROSCOPIC - Abnormal; Notable for the following components:   Color, Urine AMBER (*)    APPearance CLOUDY (*)    Ketones, ur 20 (*)    Leukocytes,Ua MODERATE (*)    Bacteria, UA FEW (*)    All other components within normal limits  CBG MONITORING, ED - Abnormal; Notable for the following components:   Glucose-Capillary 113 (*)    All other components  within normal limits  URINE CULTURE  LACTIC ACID, PLASMA  LACTIC ACID, PLASMA  CBG MONITORING, ED    EKG EKG Interpretation Date/Time:  Monday October 10 2023 15:34:07 EDT Ventricular Rate:  70 PR Interval:  114 QRS Duration:  91 QT Interval:  463 QTC Calculation: 500 R Axis:   -59  Text Interpretation: Sinus or ectopic atrial rhythm Borderline short PR interval  Left anterior fascicular block Abnormal R-wave progression, late transition Borderline repolarization abnormality Borderline prolonged QT interval Confirmed by Estelle June [phone]) on 10/10/2023 7:46:06 PM  Radiology No results found.  Procedures Procedures    Medications Ordered in ED Medications  ciprofloxacin (CIPRO) tablet 500 mg (has no administration in time range)  sodium chloride 0.9 % bolus 1,000 mL (0 mLs Intravenous Stopped 10/10/23 1726)  levETIRAcetam (KEPPRA) IVPB 1500 mg/ 100 mL premix (0 mg Intravenous Stopped 10/10/23 1726)    ED Course/ Medical Decision Making/ A&P Clinical Course as of 10/10/23 1946  Mon Oct 10, 2023  1939 I have discussed patient's presentation with on-call [place] neurologist Dr. Karel Jarvis who agrees with plan for discharge with plan for outpatient EEG if patient remained stable here without recurrent seizure activity.  Laboratory workup reveals no significant leukocytosis anemia or other electrolyte disturbance.  UA suggestive of UTI.   Reviewed prior laboratory studies including urine culture of 06/29/2023 which grew E. coli.  Sensitive to cipro..  She does have a documented penicillin allergy.  Will discharge on outpatient course of cipro and obtain urine culture  Discussed laboratory findings and plan for neurology follow-up with patient daughter at bedside.  Stable for discharge at this time [MP]    Clinical Course User Index [MP] Royanne Foots, DO                                 Medical Decision Making 78 year old female with history as above presenting for recurrent  seizure.  2 seizure episodes with brief postictal phase.  No focal neurologic deficits on my exam.  Back to baseline.  Afebrile normotensive here.  Will obtain laboratory workup including UA to evaluate for UTI.  Will provide IV fluids for rehydration and IV Keppra to prevent further seizures.  Will get in touch with her neurology team at [facility] for additional recommendations.  Amount and/or Complexity of Data Reviewed Labs: ordered.  Risk Prescription drug management.           Final Clinical Impression(s) / ED Diagnoses Final diagnoses:  Seizure (HCC)  Urinary tract infection with hematuria, site unspecified    Rx / DC Orders ED Discharge Orders          Ordered    ciprofloxacin (CIPRO) 500 MG tablet  Every 12 hours        10/10/23 1944              Royanne Foots, DO 10/10/23 1946

## 2023-10-10 NOTE — Telephone Encounter (Signed)
 Left patient a Voicemail to call the office back to schedule the appt with Dr Tat on  10-13-23 and per Tat she needs 60 mins

## 2023-10-11 ENCOUNTER — Telehealth: Payer: Self-pay | Admitting: Neurology

## 2023-10-11 NOTE — Telephone Encounter (Signed)
 Spoke with patient daughter and got patient sch for 10-13-23 to see Dr Tat. The daughter wanted me to let Dr Tat know that the provider at [facility] long put patient on Ciprofloxacin for a UTI

## 2023-10-11 NOTE — Telephone Encounter (Signed)
 PT. Daughter calling to try and find an urgent visit due to [facility] and being releases with UTI which is believed to have cause seizures they want checked out, please call Daughter if approved for sched [phone]

## 2023-10-11 NOTE — Progress Notes (Unsigned)
 Virtual Visit Via Video       Consent was obtained for video visit:  Yes.   Answered questions that patient had about telehealth interaction:  Yes.   I discussed the limitations, risks, security and privacy concerns of performing an evaluation and management service by telemedicine. I also discussed with the patient that there may be a patient responsible charge related to this service. The patient expressed understanding and agreed to proceed.  Pt location: Home Physician Location: office Name of referring provider:  Doreene Nest, NP I connected with [name] at patients initiation/request on 10/13/2023 at  9:45 AM EDT by video enabled telemedicine application and verified that I am speaking with the correct person using two identifiers. [name] Pt DOB:  1946/07/22 Video Participants:  [name]; daughter supplements history Assessment/Plan:   1.  Parkinsons Disease             -Continue carbidopa/levodopa 25/100, 3 tablets 3 times per day  2.  Depression             -On Lexapro             -On mirtazapine             -Managed by PCP   3.  RBD and GAD             -On clonazepam 0.5 mg, half tablet at bedtime only  4.  Chronic headache             -Saw Dr. Zachery Conch in the remote past.  -Has improved and her primary nurse practitioner is weaning her off of this.   5.  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 Roseanne Reno.   -memory worse since recent hospitalization.  They had cx testing with Dr. Milbert Coulter and I did not encourage r/s for a least 4-5 months given the seizures, hospitalization and keppra addition  -she thought that low dose donepezil 5 mg caused eds  -continue memantine 10 mg bid 6.  Vasovagal syncope  -This was in the setting of having a bowel movement in November, 2024 7.  seizure  -Patient did have a seizure many years ago (possibly a decade) in the  setting of Wellbutrin  -Since January, the patient has now had multiple episodes of seizure-like activity.  She has had video EEG monitoring that was just with background slowing.  Seizure semiology has not all been the same and I think it is important to define the events  -I am very concerned about the patient, as her daughter states that patient has not been at her mental baseline for 1 week, and certainly did not look good over the video.  She is not able to get out of the bed and daughter reports 4 seizures yesterday, 1 today without return to mental baseline.  I encouraged them to call 911.  Her daughter stated that she would do this.  -Continue Keppra, 750 mg twice per day.  Her daughter asked me to change to liquid.  I have no objection but will wait to see what happens after she goes to hospital for eval.    -Was going to refer her to EMU, but once I saw her today and noted the mental status change along with inability to get out of the bed for 1 week and reported marked  increase in seizure frequency, we asked her to go to the hospital.  Subjective:   [name] was seen today in follow up for continued seizure-like events.  My previous records were reviewed prior to todays visit as well as outside records available to me.  I last saw the patient on January 28.  She saw her nurse practitioner the day following our visit, and they decided to work on decreasing patient's medication load because of daytime hypersomnolence.  They were going to discontinue the mirtazapine and the gabapentin.  The patient's daughter then called here on March 14 stating that the patient had had a seizure.  The patient was back at baseline.  The daughter did not witness the event, but she heard the patient in her room and when she went in, the patient's hands were in a fist and she was shaking "a little."  Because she was back at baseline, we increased her Keppra from 500 mg to 750 mg twice per day.  Only a few days  later, her daughter called and said that she had had 2 seizures.  Patient ended up going to the emergency room and being evaluated.  She was given a load of IV Keppra, but baseline dose was not changed since we had just increased it.  Pts daughter states that she had several seizures yesterday (4) and won't eat at all but will drink.  She is hallucinating.  She is unable to get OOB and unable to get from the bed and now has a bedside toilet.  No F/C.  She has not had her seizure medication (or any medication) this morning.  This morning she had a seizure with "shaking/jerking" of both arms.  ?if loses bladder control as she has a depends on now.  She does generally know if she has to use the bathroom in day but they cannot get her up to go.  Her daughter states that she has not been at her mental baseline for 1 week.  Her daughter asks me also to change her to liquid keppra.   Last visit notes re: events:  Much has happened since our next visit and numerous records are reviewed.  Patient presented to the hospital early November with near syncope.  This was felt vasovagal, in the setting of having a bowel movement.  Patient did not quite make it to the bathroom, however.  Emergency room workup was essentially unremarkable and she was discharged home.  Patient was back in the hospital for an admission December 4.  She presented with generalized weakness and confusion, found secondary to acute cystitis.  Patient was treated with antibiotics and discharged home several days later.  Patient was back in the emergency room January 13 for possible seizure.  Records indicate that the patient raise the right arm in the air and started shaking it, but she was able to maintain standing until her daughter lowered her to the ground, after which the patient seemed to lose consciousness for a few minutes.  The only other history of seizure prior to test was many years ago, and was associated with Wellbutrin.  CT brain was  unremarkable in the emergency room.  She was discharged, with follow-up appointment made for our office for January 17.  It appears that she did go to [facility] on January 17 sometime in the late afternoon and stated that she was on her way to [facility] and had 2 witnessed seizures that lasted about 5 seconds, but I wonder  if the patient was left before being seen, because there was no notes other than the triage notes.  She was hospitalized again January 22.  She was found on the ground next to her bed with the right arm above her head shaking.  There was some question of whether or not there was benzodiazepine withdrawal, as her lab test was negative for benzodiazepines and she was on scheduled clonazepam.  Initial EEG demonstrated mild to moderate diffuse slowing and excess beta due to benzodiazepine use.  Overnight EEG continued to demonstrate continuous generalized slowing with excess beta.  Keppra was initiated, although no ictal/interictal activity was ever noted.  She was started initially on 250 mg twice per day.  She was discharged on January 24.  She returned to the emergency room on January 26 with possible seizure x 2.  Patient's clonazepam was increased to 500 mg twice per day.  She was discharged from the emergency room.  MRI brain with and without on August 18, 2023 was negative.  Today, the daughter states that it seemed to start around thanksgiving with the legs getting stiff and she would fall and be out of it.  She would take 15 min to get back to baseline.  Then, she began to have episodes where the R arm would raise and get stiff and that would last a few min.  She would not be aware that this was going on.  She then remembers an episode (on 1/17) where she was going to [facility] for a family members appt and she saw a purple monkey and then the R side of face twitched and both hands went up in the air and they put a blanket over her face so that the sun was out of the eyes and the event stopped  but when they removed the blanket it started again.  She then had another episode on 1/22.  She was again in the car and they think that the sun initiated it again.  Both hands went up toward her face and they pulled off and called 911.  Once the paramedics came "and the sun hit her, the paramedics saw her have an event and she had the gurgling noise and the blank stare."  No loss of bladder/bowel control.  She is doing well with the 500 mg keppra bid but it is making her sleepy.    Current prescribed movement disorder medications:  Carbidopa/levodopa 25/100, 3 tablets 3 times per day Mirtazapine, 30 mg at night Gabapentin, 100 mg 3 times per day Clonazepam, 0.5 mg (1/2 po bid) Memantine 10 mg bid  Prior meds: aricept, 5 mg (tired and pt d/c)    ALLERGIES:   Allergies  Allergen Reactions   Codeine Sulfate     REACTION: hallucinations   Penicillins Anaphylaxis, Swelling and Rash    Tolerates Rocephin   Amoxapine And Related    Wellbutrin [Bupropion] Other (See Comments)    Seizures and hallucinations    CURRENT MEDICATIONS:  Outpatient Encounter Medications as of 10/13/2023  Medication Sig   ACCU-CHEK GUIDE test strip USE UP TO 4 TIMES DAILY AS DIRECTED   Accu-Chek Softclix Lancets lancets USE UP TO 4 TIMES DAILY AS DIRECTED   acetaminophen (TYLENOL) 500 MG tablet Take 500 mg by mouth every 6 (six) hours as needed for moderate pain (pain score 4-6) or mild pain (pain score 1-3).   aspirin 81 MG tablet Take 81 mg by mouth daily.   blood glucose meter kit and supplies KIT  Dispense based on patient and insurance preference. Use up to four times daily as directed. (FOR ICD-9 250.00, 250.01).   calcium carbonate (TUMS EX) 750 MG chewable tablet Chew 1 tablet by mouth as needed.   carbidopa-levodopa (SINEMET IR) 25-100 MG tablet TAKE 3 TABLETS BY MOUTH 3 TIMES DAILY. (Patient taking differently: Take 3 tablets by mouth 3 (three) times daily.)   cetirizine (ZYRTEC) 10 MG tablet Take 10 mg  by mouth at bedtime.   Cholecalciferol (VITAMIN D3) 1000 UNITS CAPS Take 1 capsule by mouth daily.   ciprofloxacin (CIPRO) 500 MG tablet Take 1 tablet (500 mg total) by mouth every 12 (twelve) hours.   clonazePAM (KLONOPIN) 0.5 MG tablet TAKE 1/2 TABLET BY MOUTH AT BEDTIME AND 1/2 DAILY AS NEEDED (Patient taking differently: TAKE 1/2 TABLET BY MOUTH AT BEDTIME)   donepezil (ARICEPT) 5 MG tablet TAKE 1 TABLET BY MOUTH EVERYDAY AT BEDTIME   escitalopram (LEXAPRO) 10 MG tablet TAKE 1 TABLET BY MOUTH EVERY DAY (Patient taking differently: Take 10 mg by mouth in the morning.)   levETIRAcetam (KEPPRA) 250 MG tablet Take 1 tablet (250 mg total) by mouth 2 (two) times daily.   levETIRAcetam (KEPPRA) 500 MG tablet Take 1 tablet (500 mg total) by mouth 2 (two) times daily.   memantine (NAMENDA) 10 MG tablet Take 1 tablet (10 mg total) by mouth 2 (two) times daily.   montelukast (SINGULAIR) 10 MG tablet TAKE 1 TABLET (10 MG TOTAL) BY MOUTH AT BEDTIME. FOR ALLERGIES   rosuvastatin (CRESTOR) 5 MG tablet TAKE 1 TABLET (5 MG TOTAL) BY MOUTH EVERY EVENING. FOR CHOLESTEROL.   [DISCONTINUED] simvastatin (ZOCOR) 20 MG tablet TAKE 1 TABLET BY MOUTH EVERY DAY AT BEDTIME FOR CHOLESTEROL   No facility-administered encounter medications on file as of 10/13/2023.    Objective:   PHYSICAL EXAMINATION:    VITALS:   There were no vitals filed for this visit.   GEN:  The patient appears stated age and is in NAD. HEENT:  Normocephalic, atraumatic.  The mucous membranes are moist. The superficial temporal arteries are without ropiness or tenderness.    Neurological examination:  Orientation: The patient is alert and oriented x3.  Is a bit confused though and looks to daughter for finer aspects Cranial nerves: There is chronic R facial droop with minimal facial hypomimia. The speech is fluent but significantly dysarthric.  She does ask me how I am before conversation tapers off and becomes more confused. Soft palate  rises symmetrically and there is no tongue deviation. Hearing is decreased  to conversational tone. Sensation: Sensation is intact to light touch throughout Motor: Strength is at least antigravity x4.  Movement examination: Abnormal movements: none Gait and Station: Unable, patient in bed  I have reviewed and interpreted the following labs independently    Chemistry      Component Value Date/Time   NA 137 10/10/2023 1621   K 3.7 10/10/2023 1621   CL 100 10/10/2023 1621   CO2 28 10/10/2023 1621   BUN 16 10/10/2023 1621   CREATININE 0.52 10/10/2023 1621   CREATININE 0.69 09/03/2022 1614      Component Value Date/Time   CALCIUM 9.1 10/10/2023 1621   ALKPHOS 69 10/10/2023 1621   AST 7 (L) 10/10/2023 1621   ALT <5 10/10/2023 1621   BILITOT 1.0 10/10/2023 1621       Lab Results  Component Value Date   WBC 9.6 10/10/2023   HGB 11.9 (L) 10/10/2023   HCT 37.3 10/10/2023  MCV 90.3 10/10/2023   PLT 187 10/10/2023    Lab Results  Component Value Date   TSH 3.071 06/30/2023    Follow up Instructions      -I discussed the assessment and treatment plan with the patient. The patient was provided an opportunity to ask questions and all were answered. The patient agreed with the plan and demonstrated an understanding of the instructions.   The patient was advised to call back or seek an in-person evaluation if the symptoms worsen or if the condition fails to improve as anticipated.    Kerin Salen, DO   Cc:  Doreene Nest, NP

## 2023-10-12 ENCOUNTER — Telehealth: Payer: Self-pay

## 2023-10-12 LAB — URINE CULTURE: Culture: >=100000 — AB

## 2023-10-12 NOTE — Transitions of Care (Post Inpatient/ED Visit) (Signed)
   10/12/2023  Name: HELAINE YACKEL MRN: [id number] DOB: [birth year]  Today's TOC FU Call Status: Today's TOC FU Call Status:: Unsuccessful Call (1st Attempt) Unsuccessful Call (1st Attempt) Date: 10/12/23  Attempted to reach the patient regarding the most recent Inpatient/ED visit.  Follow Up Plan: Additional outreach attempts will be made to reach the patient to complete the Transitions of Care (Post Inpatient/ED visit) call.   Signature Karena Addison, LPN [facility] Nurse Health Advisor Direct Dial [phone]

## 2023-10-12 NOTE — Transitions of Care (Post Inpatient/ED Visit) (Signed)
 10/12/2023  Name: [name] MRN: [id number] DOB: 1946/07/18  Today's TOC FU Call Status: Today's TOC FU Call Status:: Successful TOC FU Call Completed Unsuccessful Call (1st Attempt) Date: 10/12/23 [facility] FU Call Complete Date: 10/12/23 Patient's Name and Date of Birth confirmed.  Transition Care Management Follow-up Telephone Call Date of Discharge: 10/10/23 Discharge Facility: [facility]) Type of Discharge: Emergency Department Reason for ED Visit: Other: (hematuria) How have you been since you were released from the hospital?: Same Any questions or concerns?: Yes  Items Reviewed: Did you receive and understand the discharge instructions provided?: Yes Medications obtained,verified, and reconciled?: Yes (Medications Reviewed) Any new allergies since your discharge?: No Dietary orders reviewed?: Yes Do you have support at home?: Yes People in Home: [name](ren), adult  Medications Reviewed Today: Medications Reviewed Today     Reviewed by Karena Addison, LPN (Licensed Practical Nurse) on 10/12/23 at 1329  Med List Status: <None>   Medication Order Taking? Sig Documenting Provider Last Dose Status Informant  ACCU-CHEK GUIDE test strip [id number] No USE UP TO 4 TIMES DAILY AS DIRECTED Doreene Nest, NP Taking [name], Pharmacy Records  Accu-Chek Softclix Lancets lancets [id number] No USE UP TO 4 TIMES DAILY AS DIRECTED Doreene Nest, NP Taking [name], Pharmacy Records  acetaminophen (TYLENOL) 500 MG tablet [id number] No Take 500 mg by mouth every 6 (six) hours as needed for moderate pain (pain score 4-6) or mild pain (pain score 1-3). [provider] Taking [name], Pharmacy Records  aspirin 81 MG tablet [id number] No Take 81 mg by mouth daily. [provider] Taking [name], Pharmacy Records  blood glucose meter kit and supplies KIT [id number] No Dispense based on patient and insurance preference. Use up to four times daily as  directed. (FOR ICD-9 250.00, 250.01). Doreene Nest, NP Taking [name], Pharmacy Records  calcium carbonate (TUMS EX) 750 MG chewable tablet [id number] No Chew 1 tablet by mouth as needed. [provider] Taking [name], Pharmacy Records  carbidopa-levodopa (SINEMET IR) 25-100 MG tablet [id number] No TAKE 3 TABLETS BY MOUTH 3 TIMES DAILY.  Patient taking differently: Take 3 tablets by mouth 3 (three) times daily.   Vladimir Faster, DO Taking [name], Pharmacy Records  cetirizine (ZYRTEC) 10 MG tablet [id number] No Take 10 mg by mouth at bedtime. [provider] Taking [name], Pharmacy Records  Cholecalciferol (VITAMIN D3) 1000 UNITS CAPS [id number] No Take 1 capsule by mouth daily. [provider] Taking [name], Pharmacy Records  ciprofloxacin (CIPRO) 500 MG tablet [id number]  Take 1 tablet (500 mg total) by mouth every 12 (twelve) hours. Royanne Foots, DO  Martha   clonazePAM (KLONOPIN) 0.5 MG tablet [id number] No TAKE 1/2 TABLET BY MOUTH AT BEDTIME AND 1/2 DAILY AS NEEDED  Patient taking differently: TAKE 1/2 TABLET BY MOUTH AT BEDTIME   Tat, Octaviano Batty, DO Taking Martha   donepezil (ARICEPT) 5 MG tablet [id number] No TAKE 1 TABLET BY MOUTH EVERYDAY AT BEDTIME Tat, Octaviano Batty, DO Taking [name], Pharmacy Records  escitalopram (LEXAPRO) 10 MG tablet [id number] No TAKE 1 TABLET BY MOUTH EVERY DAY  Patient taking differently: Take 10 mg by mouth in the morning.   Vladimir Faster, DO Taking [name], Pharmacy Records  levETIRAcetam (KEPPRA) 250 MG tablet [id number]  Take 1 tablet (250 mg total) by mouth 2 (two) times daily. Vladimir Faster, DO  Martha   levETIRAcetam (KEPPRA) 500 MG tablet [id number]  Take 1  tablet (500 mg total) by mouth 2 (two) times daily. Vladimir Faster, DO  Martha   memantine (NAMENDA) 10 MG tablet [id number] No Take 1 tablet (10 mg total) by mouth 2 (two) times daily. Vladimir Faster, DO Taking [name], Pharmacy Records   montelukast (SINGULAIR) 10 MG tablet [id number] No TAKE 1 TABLET (10 MG TOTAL) BY MOUTH AT BEDTIME. FOR ALLERGIES Doreene Nest, NP Taking [name], Pharmacy Records  rosuvastatin (CRESTOR) 5 MG tablet [id number] No TAKE 1 TABLET (5 MG TOTAL) BY MOUTH EVERY EVENING. FOR CHOLESTEROL. Doreene Nest, NP Taking [name], Pharmacy Records  Discontinued 01/02/20 1710             Home Care and Equipment/Supplies: Were Home Health Services Ordered?: NA Any new equipment or medical supplies ordered?: NA  Functional Questionnaire: Do you need assistance with bathing/showering or dressing?: Yes Do you need assistance with meal preparation?: Yes Do you need assistance with eating?: No Do you have difficulty maintaining continence: Yes Do you need assistance with getting out of bed/getting out of a chair/moving?: Yes Do you have difficulty managing or taking your medications?: Yes  Follow up appointments reviewed: PCP Follow-up appointment confirmed?: Yes Date of PCP follow-up appointment?: 10/14/23 Follow-up Provider: [facility] Follow-up appointment confirmed?: Yes Date of Specialist follow-up appointment?: 10/13/23 Follow-Up Specialty Provider:: neuro Do you need transportation to your follow-up appointment?: No Do you understand care options if your condition(s) worsen?: Yes-patient verbalized understanding    SIGNATURE Karena Addison, LPN [facility] Nurse Health Advisor Direct Dial [phone]

## 2023-10-13 ENCOUNTER — Other Ambulatory Visit: Payer: Self-pay

## 2023-10-13 ENCOUNTER — Emergency Department (HOSPITAL_COMMUNITY)

## 2023-10-13 ENCOUNTER — Encounter (HOSPITAL_COMMUNITY): Payer: Self-pay | Admitting: Emergency Medicine

## 2023-10-13 ENCOUNTER — Inpatient Hospital Stay (HOSPITAL_COMMUNITY)
Admission: EM | Admit: 2023-10-13 | Discharge: 2023-10-17 | DRG: 101 | Disposition: A | Discharge status: Hospice | Attending: Internal Medicine | Admitting: Internal Medicine

## 2023-10-13 ENCOUNTER — Observation Stay (HOSPITAL_COMMUNITY)

## 2023-10-13 ENCOUNTER — Telehealth (HOSPITAL_BASED_OUTPATIENT_CLINIC_OR_DEPARTMENT_OTHER): Payer: Self-pay

## 2023-10-13 ENCOUNTER — Telehealth: Admitting: Neurology

## 2023-10-13 DIAGNOSIS — Z8249 Family history of ischemic heart disease and other diseases of the circulatory system: Secondary | ICD-10-CM | POA: Diagnosis not present

## 2023-10-13 DIAGNOSIS — Z8659 Personal history of other mental and behavioral disorders: Secondary | ICD-10-CM | POA: Diagnosis not present

## 2023-10-13 DIAGNOSIS — Z515 Encounter for palliative care: Secondary | ICD-10-CM | POA: Diagnosis not present

## 2023-10-13 DIAGNOSIS — Z833 Family history of diabetes mellitus: Secondary | ICD-10-CM | POA: Diagnosis not present

## 2023-10-13 DIAGNOSIS — E876 Hypokalemia: Secondary | ICD-10-CM | POA: Diagnosis not present

## 2023-10-13 DIAGNOSIS — Z885 Allergy status to narcotic agent status: Secondary | ICD-10-CM

## 2023-10-13 DIAGNOSIS — G20A1 Parkinson's disease without dyskinesia, without mention of fluctuations: Secondary | ICD-10-CM | POA: Diagnosis not present

## 2023-10-13 DIAGNOSIS — G510 Bell's palsy: Secondary | ICD-10-CM | POA: Diagnosis present

## 2023-10-13 DIAGNOSIS — R413 Other amnesia: Secondary | ICD-10-CM | POA: Diagnosis not present

## 2023-10-13 DIAGNOSIS — Z79899 Other long term (current) drug therapy: Secondary | ICD-10-CM

## 2023-10-13 DIAGNOSIS — Z880 Allergy status to penicillin: Secondary | ICD-10-CM

## 2023-10-13 DIAGNOSIS — G20B2 Parkinson's disease with dyskinesia, with fluctuations: Secondary | ICD-10-CM

## 2023-10-13 DIAGNOSIS — G40909 Epilepsy, unspecified, not intractable, without status epilepticus: Principal | ICD-10-CM

## 2023-10-13 DIAGNOSIS — K219 Gastro-esophageal reflux disease without esophagitis: Secondary | ICD-10-CM | POA: Diagnosis present

## 2023-10-13 DIAGNOSIS — R569 Unspecified convulsions: Secondary | ICD-10-CM | POA: Diagnosis not present

## 2023-10-13 DIAGNOSIS — F32A Depression, unspecified: Secondary | ICD-10-CM | POA: Diagnosis not present

## 2023-10-13 DIAGNOSIS — I6782 Cerebral ischemia: Secondary | ICD-10-CM | POA: Diagnosis not present

## 2023-10-13 DIAGNOSIS — H903 Sensorineural hearing loss, bilateral: Secondary | ICD-10-CM | POA: Diagnosis not present

## 2023-10-13 DIAGNOSIS — G4489 Other headache syndrome: Secondary | ICD-10-CM | POA: Diagnosis not present

## 2023-10-13 DIAGNOSIS — Z8672 Personal history of thrombophlebitis: Secondary | ICD-10-CM

## 2023-10-13 DIAGNOSIS — Z888 Allergy status to other drugs, medicaments and biological substances status: Secondary | ICD-10-CM | POA: Diagnosis not present

## 2023-10-13 DIAGNOSIS — E119 Type 2 diabetes mellitus without complications: Secondary | ICD-10-CM | POA: Diagnosis present

## 2023-10-13 DIAGNOSIS — N3000 Acute cystitis without hematuria: Secondary | ICD-10-CM | POA: Diagnosis not present

## 2023-10-13 DIAGNOSIS — Z7982 Long term (current) use of aspirin: Secondary | ICD-10-CM | POA: Diagnosis not present

## 2023-10-13 DIAGNOSIS — Z66 Do not resuscitate: Secondary | ICD-10-CM | POA: Diagnosis not present

## 2023-10-13 DIAGNOSIS — Z825 Family history of asthma and other chronic lower respiratory diseases: Secondary | ICD-10-CM | POA: Diagnosis not present

## 2023-10-13 DIAGNOSIS — I10 Essential (primary) hypertension: Secondary | ICD-10-CM | POA: Diagnosis present

## 2023-10-13 DIAGNOSIS — E8881 Metabolic syndrome: Secondary | ICD-10-CM | POA: Diagnosis present

## 2023-10-13 DIAGNOSIS — F02C18 Dementia in other diseases classified elsewhere, severe, with other behavioral disturbance: Secondary | ICD-10-CM | POA: Diagnosis not present

## 2023-10-13 DIAGNOSIS — F0283 Dementia in other diseases classified elsewhere, unspecified severity, with mood disturbance: Secondary | ICD-10-CM | POA: Diagnosis present

## 2023-10-13 DIAGNOSIS — M810 Age-related osteoporosis without current pathological fracture: Secondary | ICD-10-CM | POA: Diagnosis present

## 2023-10-13 DIAGNOSIS — F0284 Dementia in other diseases classified elsewhere, unspecified severity, with anxiety: Secondary | ICD-10-CM | POA: Diagnosis present

## 2023-10-13 DIAGNOSIS — E782 Mixed hyperlipidemia: Secondary | ICD-10-CM | POA: Diagnosis present

## 2023-10-13 DIAGNOSIS — Z821 Family history of blindness and visual loss: Secondary | ICD-10-CM

## 2023-10-13 DIAGNOSIS — R404 Transient alteration of awareness: Secondary | ICD-10-CM | POA: Diagnosis not present

## 2023-10-13 DIAGNOSIS — R410 Disorientation, unspecified: Secondary | ICD-10-CM | POA: Diagnosis not present

## 2023-10-13 DIAGNOSIS — F0390 Unspecified dementia without behavioral disturbance: Secondary | ICD-10-CM | POA: Diagnosis not present

## 2023-10-13 LAB — BASIC METABOLIC PANEL
Anion gap: 10 (ref 5–15)
BUN: 7 mg/dL — ABNORMAL LOW (ref 8–23)
CO2: 27 mmol/L (ref 22–32)
Calcium: 8.8 mg/dL — ABNORMAL LOW (ref 8.9–10.3)
Chloride: 98 mmol/L (ref 98–111)
Creatinine, Ser: 0.6 mg/dL (ref 0.44–1.00)
GFR, Estimated: >60 mL/min (ref >60)
Glucose, Bld: 117 mg/dL — ABNORMAL HIGH (ref 70–99)
Potassium: 2.9 mmol/L — ABNORMAL LOW (ref 3.5–5.1)
Sodium: 135 mmol/L (ref 135–145)

## 2023-10-13 LAB — CBC
HCT: 32.9 % — ABNORMAL LOW (ref 36.0–46.0)
Hemoglobin: 10.8 g/dL — ABNORMAL LOW (ref 12.0–15.0)
MCH: 28.6 pg (ref 26.0–34.0)
MCHC: 32.8 g/dL (ref 30.0–36.0)
MCV: 87 fL (ref 80.0–100.0)
Platelets: 212 10*3/uL (ref 150–400)
RBC: 3.78 MIL/uL — ABNORMAL LOW (ref 3.87–5.11)
RDW: 15 % (ref 11.5–15.5)
WBC: 9.3 10*3/uL (ref 4.0–10.5)
nRBC: 0 % (ref 0.0–0.2)

## 2023-10-13 LAB — CREATININE, SERUM
Creatinine, Ser: 0.56 mg/dL (ref 0.44–1.00)
GFR, Estimated: >60 mL/min (ref >60)

## 2023-10-13 LAB — MAGNESIUM: Magnesium: 1.7 mg/dL (ref 1.7–2.4)

## 2023-10-13 MED ORDER — CIPROFLOXACIN HCL 500 MG PO TABS
500.0000 mg | ORAL_TABLET | Freq: Two times a day (BID) | ORAL | Status: DC
  Administered 2023-10-14: 500 mg via ORAL
  Filled 2023-10-13: qty 1

## 2023-10-13 MED ORDER — MAGNESIUM SULFATE 2 GM/50ML IV SOLN
2.0000 g | Freq: Once | INTRAVENOUS | Status: AC
  Administered 2023-10-13: 2 g via INTRAVENOUS
  Filled 2023-10-13: qty 50

## 2023-10-13 MED ORDER — ACETAMINOPHEN 500 MG PO TABS: 500.0000 mg | ORAL_TABLET | Freq: Four times a day (QID) | ORAL | Status: DC | PRN

## 2023-10-13 MED ORDER — CIPROFLOXACIN HCL 500 MG PO TABS
500.0000 mg | ORAL_TABLET | Freq: Two times a day (BID) | ORAL | Status: DC
  Administered 2023-10-13: 500 mg via ORAL
  Filled 2023-10-13: qty 1

## 2023-10-13 MED ORDER — MEMANTINE HCL 10 MG PO TABS
10.0000 mg | ORAL_TABLET | Freq: Two times a day (BID) | ORAL | Status: DC
  Administered 2023-10-13 – 2023-10-17 (×8): 10 mg via ORAL
  Filled 2023-10-13 (×8): qty 1

## 2023-10-13 MED ORDER — ROSUVASTATIN CALCIUM 5 MG PO TABS
5.0000 mg | ORAL_TABLET | Freq: Every evening | ORAL | Status: DC
  Administered 2023-10-13 – 2023-10-15 (×3): 5 mg via ORAL
  Filled 2023-10-13 (×3): qty 1

## 2023-10-13 MED ORDER — POTASSIUM CHLORIDE 20 MEQ PO PACK
40.0000 meq | PACK | Freq: Once | ORAL | Status: AC
  Administered 2023-10-13: 40 meq via ORAL
  Filled 2023-10-13: qty 2

## 2023-10-13 MED ORDER — LEVETIRACETAM IN NACL 1000 MG/100ML IV SOLN
3000.0000 mg | Freq: Once | INTRAVENOUS | Status: AC
  Administered 2023-10-13: 3000 mg via INTRAVENOUS
  Filled 2023-10-13: qty 300

## 2023-10-13 MED ORDER — LEVETIRACETAM 500 MG PO TABS: 500.0000 mg | ORAL_TABLET | Freq: Two times a day (BID) | ORAL | Status: DC

## 2023-10-13 MED ORDER — ENOXAPARIN SODIUM 40 MG/0.4ML IJ SOSY
40.0000 mg | PREFILLED_SYRINGE | INTRAMUSCULAR | Status: DC
  Administered 2023-10-14 – 2023-10-16 (×3): 40 mg via SUBCUTANEOUS
  Filled 2023-10-13 (×3): qty 0.4

## 2023-10-13 MED ORDER — ONDANSETRON HCL 4 MG PO TABS: 4.0000 mg | ORAL_TABLET | Freq: Four times a day (QID) | ORAL | Status: DC | PRN

## 2023-10-13 MED ORDER — CLONAZEPAM 0.5 MG PO TABS
0.5000 mg | ORAL_TABLET | Freq: Every day | ORAL | Status: DC
  Administered 2023-10-13 – 2023-10-17 (×5): 0.5 mg via ORAL
  Filled 2023-10-13 (×5): qty 1

## 2023-10-13 MED ORDER — VITAMIN D 25 MCG (1000 UNIT) PO TABS
1000.0000 [IU] | ORAL_TABLET | Freq: Every day | ORAL | Status: DC
  Administered 2023-10-13 – 2023-10-17 (×5): 1000 [IU] via ORAL
  Filled 2023-10-13 (×5): qty 1

## 2023-10-13 MED ORDER — ACETAMINOPHEN 325 MG PO TABS: 650.0000 mg | ORAL_TABLET | Freq: Four times a day (QID) | ORAL | Status: DC | PRN

## 2023-10-13 MED ORDER — CARBIDOPA-LEVODOPA 25-100 MG PO TABS
3.0000 | ORAL_TABLET | Freq: Three times a day (TID) | ORAL | Status: DC
  Administered 2023-10-13 – 2023-10-17 (×13): 3 via ORAL
  Filled 2023-10-13 (×13): qty 3

## 2023-10-13 MED ORDER — POTASSIUM CHLORIDE 10 MEQ/100ML IV SOLN
10.0000 meq | INTRAVENOUS | Status: DC
  Administered 2023-10-13 (×2): 10 meq via INTRAVENOUS
  Filled 2023-10-13 (×2): qty 100

## 2023-10-13 MED ORDER — POTASSIUM CHLORIDE 10 MEQ/100ML IV SOLN
10.0000 meq | INTRAVENOUS | Status: AC
  Administered 2023-10-13 (×3): 10 meq via INTRAVENOUS
  Filled 2023-10-13 (×2): qty 100

## 2023-10-13 MED ORDER — LEVETIRACETAM 500 MG PO TABS
750.0000 mg | ORAL_TABLET | Freq: Two times a day (BID) | ORAL | Status: DC
  Administered 2023-10-13 – 2023-10-14 (×2): 750 mg via ORAL
  Filled 2023-10-13 (×2): qty 3

## 2023-10-13 MED ORDER — ONDANSETRON HCL 4 MG/2ML IJ SOLN: 4.0000 mg | Freq: Four times a day (QID) | INTRAMUSCULAR | Status: DC | PRN

## 2023-10-13 MED ORDER — ACETAMINOPHEN 650 MG RE SUPP: 650.0000 mg | Freq: Four times a day (QID) | RECTAL | Status: DC | PRN

## 2023-10-13 MED ORDER — DONEPEZIL HCL 5 MG PO TABS
5.0000 mg | ORAL_TABLET | Freq: Every day | ORAL | Status: DC
  Administered 2023-10-13 – 2023-10-17 (×4): 5 mg via ORAL
  Filled 2023-10-13 (×4): qty 1

## 2023-10-13 MED ORDER — ASPIRIN 81 MG PO TBEC
81.0000 mg | DELAYED_RELEASE_TABLET | Freq: Every day | ORAL | Status: DC
  Administered 2023-10-13 – 2023-10-17 (×5): 81 mg via ORAL
  Filled 2023-10-13 (×5): qty 1

## 2023-10-13 MED ORDER — MONTELUKAST SODIUM 10 MG PO TABS
10.0000 mg | ORAL_TABLET | Freq: Every day | ORAL | Status: DC
  Administered 2023-10-13 – 2023-10-17 (×4): 10 mg via ORAL
  Filled 2023-10-13 (×4): qty 1

## 2023-10-13 MED ORDER — MIRTAZAPINE 15 MG PO TABS
15.0000 mg | ORAL_TABLET | Freq: Every day | ORAL | Status: DC
  Administered 2023-10-13 – 2023-10-17 (×4): 15 mg via ORAL
  Filled 2023-10-13 (×5): qty 1

## 2023-10-13 MED ORDER — CALCIUM CARBONATE ANTACID 500 MG PO CHEW: 1.0000 | CHEWABLE_TABLET | Freq: Every day | ORAL | Status: DC | PRN

## 2023-10-13 MED ORDER — ALBUTEROL SULFATE (2.5 MG/3ML) 0.083% IN NEBU: 2.5000 mg | INHALATION_SOLUTION | RESPIRATORY_TRACT | Status: DC | PRN

## 2023-10-13 MED ORDER — ESCITALOPRAM OXALATE 10 MG PO TABS
10.0000 mg | ORAL_TABLET | Freq: Every day | ORAL | Status: DC
  Administered 2023-10-13 – 2023-10-17 (×5): 10 mg via ORAL
  Filled 2023-10-13 (×5): qty 1

## 2023-10-13 MED ORDER — HYDRALAZINE HCL 20 MG/ML IJ SOLN: 5.0000 mg | Freq: Four times a day (QID) | INTRAMUSCULAR | Status: DC | PRN

## 2023-10-13 NOTE — ED Notes (Signed)
 EEG at bedside.

## 2023-10-13 NOTE — ED Provider Notes (Signed)
 [facility] AT [facility] Provider Note   CSN: [id number] Arrival date & time: 10/13/23  1109     History  Chief Complaint  Patient presents with   Seizures    [name] is a 78 y.o. female.   Seizures  Presents via EMS from home due to increasing frequency of seizures and declining mental status over the last week.  Patient saw her neurologist this morning who noted decreased mental status and felt she should be evaluated in the ED.  EMS noted 2 seizures en route for which she got 5 mg of midazolam IM.  After the midazolam status post seizure, patient is unable to participate in history.  She is able to follow commands somewhat.  Her daughter and granddaughter at bedside.  They state that since Monday of this week her seizures have been coming on much more frequently.  Before this period, she had them very infrequently over the last couple years.  First seizure appears to have occurred after starting Wellbutrin which is of course been discontinued.  Her seizures consist of bilateral upper extremity shaking along with neck extension and shaking.  After the episodes, she is very confused.  However, she has been more confused than baseline over the last week as well, mostly just staring at her family members instead of communicating as she was prior.  She does have a history of Parkinson disease, and family feel this could be worsening.  Family deny any fevers, chest pain, shortness of breath, nausea, vomiting, constipation, diarrhea, urinary symptoms.  They have been giving her her baseline dose of Keppra which she has been on for many months.  She has also been getting her nighttime half tablet of Klonopin each night, the family is unsure if this is for her Parkinson's or why she has been prescribed this medicine.  She is been following up with neurology.  She is also been taking ciprofloxacin for urinary tract infection with Aerococcus sanguinicola  diagnosed 3 days ago when she was in the ED at [facility] long.    Home Medications Prior to Admission medications   Medication Sig Start Date End Date Taking? Authorizing Provider  ACCU-CHEK GUIDE test strip USE UP TO 4 TIMES DAILY AS DIRECTED 11/14/22   Doreene Nest, NP  Accu-Chek Softclix Lancets lancets USE UP TO 4 TIMES DAILY AS DIRECTED 07/25/22   Doreene Nest, NP  acetaminophen (TYLENOL) 500 MG tablet Take 500 mg by mouth every 6 (six) hours as needed for moderate pain (pain score 4-6) or mild pain (pain score 1-3).    [provider]  aspirin 81 MG tablet Take 81 mg by mouth daily.    [provider]  blood glucose meter kit and supplies KIT Dispense based on patient and insurance preference. Use up to four times daily as directed. (FOR ICD-9 250.00, 250.01). 04/15/22   Doreene Nest, NP  calcium carbonate (TUMS EX) 750 MG chewable tablet Chew 1 tablet by mouth as needed.    [provider]  carbidopa-levodopa (SINEMET IR) 25-100 MG tablet TAKE 3 TABLETS BY MOUTH 3 TIMES DAILY. Patient taking differently: Take 3 tablets by mouth 3 (three) times daily. 05/24/23   Tat, Octaviano Batty, DO  cetirizine (ZYRTEC) 10 MG tablet Take 10 mg by mouth at bedtime.    [provider]  Cholecalciferol (VITAMIN D3) 1000 UNITS CAPS Take 1 capsule by mouth daily.    [provider]  ciprofloxacin (CIPRO) 500 MG  tablet Take 1 tablet (500 mg total) by mouth every 12 (twelve) hours. 10/10/23   Royanne Foots, DO  clonazePAM (KLONOPIN) 0.5 MG tablet TAKE 1/2 TABLET BY MOUTH AT BEDTIME AND 1/2 DAILY AS NEEDED Patient taking differently: TAKE 1/2 TABLET BY MOUTH AT BEDTIME 08/23/23   Tat, Rebecca S, DO  donepezil (ARICEPT) 5 MG tablet TAKE 1 TABLET BY MOUTH EVERYDAY AT BEDTIME 06/20/23   Tat, Rebecca S, DO  escitalopram (LEXAPRO) 10 MG tablet TAKE 1 TABLET BY MOUTH EVERY DAY Patient taking differently: Take 10 mg by mouth in the morning. 06/20/23   Tat, Octaviano Batty, DO  levETIRAcetam (KEPPRA) 250 MG tablet Take 1 tablet (250 mg total) by mouth 2 (two) times daily. 10/07/23   Tat, Octaviano Batty, DO  levETIRAcetam (KEPPRA) 500 MG tablet Take 1 tablet (500 mg total) by mouth 2 (two) times daily. 10/04/23   Tat, Octaviano Batty, DO  memantine (NAMENDA) 10 MG tablet Take 1 tablet (10 mg total) by mouth 2 (two) times daily. 01/07/23   Tat, Octaviano Batty, DO  montelukast (SINGULAIR) 10 MG tablet TAKE 1 TABLET (10 MG TOTAL) BY MOUTH AT BEDTIME. FOR ALLERGIES 05/01/23   Doreene Nest, NP  rosuvastatin (CRESTOR) 5 MG tablet TAKE 1 TABLET (5 MG TOTAL) BY MOUTH EVERY EVENING. FOR CHOLESTEROL. 05/01/23   Doreene Nest, NP  simvastatin (ZOCOR) 20 MG tablet TAKE 1 TABLET BY MOUTH EVERY DAY AT BEDTIME FOR CHOLESTEROL 10/01/19 01/02/20  Doreene Nest, NP      Allergies    Codeine sulfate, Penicillins, Amoxapine and related, and Wellbutrin [bupropion]    Review of Systems   Review of Systems  Neurological:  Positive for seizures.   Physical Exam Updated Vital Signs BP (!) 170/73   Pulse 65   Temp 98.1 F (36.7 C) (Oral)   Resp 14   SpO2 100%  Physical Exam Constitutional:      General: She is not in acute distress.    Appearance: She is ill-appearing.     Comments: Intermittently alert  HENT:     Head: Normocephalic and atraumatic.     Mouth/Throat:     Mouth: Mucous membranes are dry.     Pharynx: Oropharynx is clear.  Eyes:     Extraocular Movements: Extraocular movements intact.     Conjunctiva/sclera: Conjunctivae normal.     Comments: Dried tears bilaterally  Cardiovascular:     Rate and Rhythm: Normal rate and regular rhythm.     Pulses: Normal pulses.     Heart sounds: Normal heart sounds.  Pulmonary:     Effort: Pulmonary effort is normal. No respiratory distress.     Breath sounds: Normal breath sounds.  Abdominal:     General: Abdomen is flat. Bowel sounds are normal. There is no distension.     Palpations: Abdomen is soft.     Comments: Mild  tenderness to palpation.  Musculoskeletal:     Cervical back: Normal range of motion.     Right lower leg: No edema.     Left lower leg: No edema.  Skin:    General: Skin is warm and dry.  Neurological:     Comments: Unable to fully participate in neurologic exam due to sedation and baseline dementia.  She is able to follow commands when asked to squeeze my fingers bilaterally which is weak though symmetric.  Difficult to assess motor function of lower extremities.  No gross focal cranial nerve deficit.  Patient is not safe  to assess gait.  Psychiatric:     Comments: Unable to answer orientation questions.  Patient stares off at family members and to examiner.  She intermittently will answer questions saying that she feels good and has no pain.     ED Results / Procedures / Treatments   Labs (all labs ordered are listed, but only abnormal results are displayed) Labs Reviewed  CBC - Abnormal; Notable for the following components:      Result Value   RBC 3.78 (*)    Hemoglobin 10.8 (*)    HCT 32.9 (*)    All other components within normal limits  BASIC METABOLIC PANEL - Abnormal; Notable for the following components:   Potassium 2.9 (*)    Glucose, Bld 117 (*)    BUN 7 (*)    Calcium 8.8 (*)    All other components within normal limits  MAGNESIUM  URINALYSIS, W/ REFLEX TO CULTURE (INFECTION SUSPECTED)  RAPID URINE DRUG SCREEN, HOSP PERFORMED    EKG None  Radiology No results found.  Procedures Procedures    Medications Ordered in ED Medications  potassium chloride 10 mEq in 100 mL IVPB (has no administration in time range)  levETIRAcetam (KEPPRA) IVPB 1000 mg/100 mL premix (3,000 mg Intravenous New Bag/Given 10/13/23 1406)    ED Course/ Medical Decision Making/ A&P                                 Medical Decision Making Amount and/or Complexity of Data Reviewed Labs: ordered. Radiology: ordered.  Risk Prescription drug management.   78 year old female with  Parkinson's disease, seizures, GAD here for evaluation of worsening seizures and mental status.  Vitals reassuring here though SpO2 borderline at 90%.  Exam notable for intermittent alertness and difficulty following all commands likely the setting of midazolam administration and postictal state.  Has been on ciprofloxacin for UTI; consider this as nidus for worsening seizures.  Given increasing frequency and rapid onset, will collect CT head without contrast.  Will also contact neurology for likely video EEG and monitoring.  Will collect baseline labs to rule out metabolic disturbance and repeat UA with culture.  While awaiting labs, patient demonstrated around a 2-minute seizure of similar semiology though without tonic-clonic bilateral upper extremity movements.  Patient did exhibit neck extension as described.  By the time I arrived in the room, family mentioned that she was starting to come out of the seizure already.  Consulted neurology who will see patient and provide further recommendations.  Will give 2000 mg IV Keppra load.  Potassium 2.9, will supplement IV given patient's altered mental status.  Magnesium 1.7.  CBC with hemoglobin close to baseline of 11-12.  After consultation with cardiology, will admit patient to medicine service for video EEG, monitoring, and medication adjustments.        Final Clinical Impression(s) / ED Diagnoses Final diagnoses:  Seizures [facility]    Rx / DC Orders ED Discharge Orders     None         Evette Georges, MD 10/13/23 1449    Gerhard Munch, MD 10/17/23 1531

## 2023-10-13 NOTE — ED Notes (Signed)
 Mittens placed on patient to prevent from pulling at EEG and IV.  Daughter at bedside aware and in agreement with mittens.

## 2023-10-13 NOTE — ED Notes (Addendum)
 Patient transported to CT

## 2023-10-13 NOTE — H&P (Addendum)
 History and Physical    DOA: 10/13/2023  PCP: Doreene Nest, NP  Patient coming from: home  Chief Complaint: Recurrent seizure like activity  HPI: [name] is a 78 y.o. female with history h/o multiple medical problems including hypertension, Parkinson's disease with associated neurocognitive disorder, recurrent Bell's palsy causing left-sided facial droop, recurrent UTI, hyperlipidemia, anxiety disorder, GERD, seizure disorder on Keppra, brought in today to the ED by daughter due to recurrent seizure-like activity.  Patient had Wellbutrin induced seizures many years back for which she was transiently on Keppra per daughter.  2 months back patient was admitted for seizure-like activity and discharged home with 500 mg Keppra twice daily although continuous EEG at that time was unremarkable.  Patient apparently has been having these seizure-like episodes prior to last admission about q. weekly and lately almost daily.  She was seen at [facility] long ED on 3/17 for seizure-like episode, diagnosed with UTI and discharged home with 5-day course of ciprofloxacin as well as increased dose of Keppra to 750 Mg twice daily.  Today patient was brought back for recurrent seizure-like activity upon the advice of teleneurology evaluation.  Daughter at bedside and provides most of the history.  Patient apparently has neurocognitive disorder and disoriented to place/time at baseline.  She can identify her daughter/granddaughter but otherwise disoriented to person as well.  She communicates but most of the time illegible although can communicate the need to go to the bathroom or pain etc. ED course: Vital stable except for fluctuating blood pressure with systolic 120s to 324M.  Patient had a witnessed episode in the ED, received loading dose of 3 g IV Keppra.  Patient also evaluated by neurology who recommended admission for 48-hour continuous EEG.    Review of Systems: As per HPI, otherwise review of  systems negative.    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  Really didn't use.   Weight loss 46 pounds since last visit.  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    Past Surgical History:  Procedure Laterality Date   bladder tack  11/1995   BREAST BIOPSY  05/20/2000   bilaterally   BREAST CYST ASPIRATION  12/1998   bilaterally   CARDIAC CATHETERIZATION  2004   CATARACT EXTRACTION W/ INTRAOCULAR LENS  IMPLANT, BILATERAL     FRACTURE SURGERY  02/09/2006   bilateral elbows    FRACTURE SURGERY     right knee   FRACTURE SURGERY     tib plateau   KNEE ARTHROSCOPY  12/12/2002   right    LAPAROSCOPIC GASTRIC BANDING  2008   SEPTOPLASTY  08/1998   with antral window    TONSILLECTOMY AND ADENOIDECTOMY  1970   TUBAL LIGATION  1970's    Social history:  reports that she has never smoked. She has never used smokeless tobacco. She reports that she does not drink alcohol and does not use drugs.   Allergies  Allergen Reactions   Codeine Sulfate     REACTION: hallucinations   Penicillins Anaphylaxis, Swelling and Rash    Tolerates Rocephin   Amoxapine And Related    Wellbutrin [Bupropion] Other (See Comments)    Seizures and hallucinations    Family History  Problem Relation Age of Onset   Asthma Mother    COPD Father    Heart disease Father    Diabetes Sister    Vision loss Sister        legally blind   Hypertension Child    Hyperlipidemia Child    Diabetes Child    Diabetes Son    Breast cancer Neg Hx       Prior to Admission medications   Medication Sig Start Date End Date Taking? Authorizing Provider  acetaminophen (TYLENOL) 500 MG tablet Take 500 mg by mouth every 6 (six) hours as needed for moderate pain (pain score 4-6) or mild pain (pain score 1-3).   Yes [provider]  aspirin 81 MG tablet Take 81 mg by mouth daily.   Yes [provider]  calcium carbonate (TUMS EX) 750 MG chewable tablet Chew 1 tablet by mouth daily as needed for heartburn.   Yes [provider]  carbidopa-levodopa (SINEMET IR) 25-100 MG tablet TAKE 3 TABLETS BY MOUTH 3 TIMES DAILY. Patient taking differently: Take 3 tablets by mouth 3 (three) times daily. 05/24/23  Yes Tat, Octaviano Batty, DO  cetirizine (ZYRTEC) 10 MG tablet Take 10 mg by mouth at bedtime.   Yes [provider]  Cholecalciferol (VITAMIN D3) 1000 UNITS CAPS Take 1 capsule by mouth daily.   Yes [provider]  ciprofloxacin (CIPRO) 500 MG tablet Take 1 tablet  (500 mg total) by mouth every 12 (twelve) hours. 10/10/23  Yes Royanne Foots, DO  clonazePAM (KLONOPIN) 0.5 MG tablet TAKE 1/2 TABLET BY MOUTH AT BEDTIME AND 1/2 DAILY AS NEEDED Patient taking differently: TAKE 1/2 TABLET BY MOUTH AT BEDTIME 08/23/23  Yes Tat, Rebecca S, DO  donepezil (ARICEPT) 5 MG tablet TAKE 1 TABLET BY MOUTH EVERYDAY AT BEDTIME 06/20/23  Yes Tat, Rebecca S, DO  escitalopram (LEXAPRO) 10 MG tablet TAKE 1 TABLET BY MOUTH EVERY DAY Patient taking differently: Take 10 mg by mouth in the morning. 06/20/23  Yes Tat, Octaviano Batty, DO  levETIRAcetam (KEPPRA) 250 MG tablet Take 1 tablet (250 mg total) by mouth 2 (two) times daily. 10/07/23  Yes Tat, Octaviano Batty, DO  levETIRAcetam (KEPPRA) 500 MG tablet Take 1 tablet (500  mg total) by mouth 2 (two) times daily. 10/04/23  Yes Tat, Octaviano Batty, DO  memantine (NAMENDA) 10 MG tablet Take 1 tablet (10 mg total) by mouth 2 (two) times daily. 01/07/23  Yes Tat, Octaviano Batty, DO  mirtazapine (REMERON) 30 MG tablet Take 15 mg by mouth at bedtime. 09/15/23  Yes [provider]  montelukast (SINGULAIR) 10 MG tablet TAKE 1 TABLET (10 MG TOTAL) BY MOUTH AT BEDTIME. FOR ALLERGIES 05/01/23  Yes Doreene Nest, NP  rosuvastatin (CRESTOR) 5 MG tablet TAKE 1 TABLET (5 MG TOTAL) BY MOUTH EVERY EVENING. FOR CHOLESTEROL. 05/01/23  Yes Doreene Nest, NP  ACCU-CHEK GUIDE test strip USE UP TO 4 TIMES DAILY AS DIRECTED 11/14/22   Doreene Nest, NP  Accu-Chek Softclix Lancets lancets USE UP TO 4 TIMES DAILY AS DIRECTED 07/25/22   Doreene Nest, NP  blood glucose meter kit and supplies KIT Dispense based on patient and insurance preference. Use up to four times daily as directed. (FOR ICD-9 250.00, 250.01). 04/15/22   Doreene Nest, NP  simvastatin (ZOCOR) 20 MG tablet TAKE 1 TABLET BY MOUTH EVERY DAY AT BEDTIME FOR CHOLESTEROL 10/01/19 01/02/20  Doreene Nest, NP    Physical Exam: Vitals:   10/13/23 1300 10/13/23 1405 10/13/23 1415 10/13/23  1445  BP: (!) 170/73  (!) 164/72 (!) 149/75  Pulse: 65  73 72  Resp: 14  20 18   Temp:  98.1 F (36.7 C)    TempSrc:  Oral    SpO2: 100%  100% 100%    Constitutional: Patient awake, stares at her daughter most of the times but smiles and communicates minimally.  Able to follow commands.  NAD, calm, comfortable Eyes: PERRL, lids and conjunctivae normal ENMT: Mucous membranes are dry. Posterior pharynx clear of any exudate or lesions.Normal dentition.  Neck: normal, supple, no masses, no thyromegaly Respiratory: clear to auscultation bilaterally, no wheezing, no crackles. Normal respiratory effort. No accessory muscle use.  Cardiovascular: Regular rate and rhythm, no murmurs / rubs / gallops. No extremity edema. 2+ pedal pulses. No carotid bruits.  Abdomen: no tenderness, no masses palpated. No hepatosplenomegaly. Bowel sounds positive.  Musculoskeletal: no clubbing / cyanosis. No joint deformity upper and lower extremities.  Limited range of motion along the right knee joint due to chronic injury.   Neurologic: Left side facial droop from old Bell's palsy, otherwise CN 2-12 grossly intact.  Able to follow commands to some extent-Strength 3-4/5 in all 4.  Could not test further Psychiatric: Impaired judgment and insight due to underlying neurocognitive disorder. Alert and oriented x 1 (was able to name her daughter, could not name the place and stated this was the year 34).  Appears calm currently with daughter at bedside but according to daughter tends to pull on EEG leads and might need mittens.     Labs on Admission: I have personally reviewed following labs and imaging studies  CBC: Recent Labs  Lab 10/10/23 1621 10/13/23 1319  WBC 9.6 9.3  NEUTROABS 7.9*  --   HGB 11.9* 10.8*  HCT 37.3 32.9*  MCV 90.3 87.0  PLT 187 212   Basic Metabolic Panel: Recent Labs  Lab 10/10/23 1621 10/13/23 1319  NA 137 135  K 3.7 2.9*  CL 100 98  CO2 28 27  GLUCOSE 129* 117*  BUN 16 7*   CREATININE 0.52 0.60  CALCIUM 9.1 8.8*  MG  --  1.7   GFR: Estimated Creatinine Clearance: 50.6 mL/min (by C-G  formula based on SCr of 0.6 mg/dL). Recent Labs  Lab 10/10/23 1621 10/10/23 1925 10/13/23 1319  WBC 9.6  --  9.3  LATICACIDVEN  --  1.2  --    Liver Function Tests: Recent Labs  Lab 10/10/23 1621  AST 7*  ALT <5  ALKPHOS 69  BILITOT 1.0  PROT 5.9*  ALBUMIN 3.2*   No results for input(s): "LIPASE", "AMYLASE" in the last 168 hours. No results for input(s): "AMMONIA" in the last 168 hours. Coagulation Profile: No results for input(s): "INR", "PROTIME" in the last 168 hours. Cardiac Enzymes: No results for input(s): "CKTOTAL", "CKMB", "CKMBINDEX", "TROPONINI" in the last 168 hours. BNP (last 3 results) No results for input(s): "PROBNP" in the last 8760 hours. HbA1C: No results for input(s): "HGBA1C" in the last 72 hours. CBG: Recent Labs  Lab 10/10/23 1548  GLUCAP 113*   Lipid Profile: No results for input(s): "CHOL", "HDL", "LDLCALC", "TRIG", "CHOLHDL", "LDLDIRECT" in the last 72 hours. Thyroid Function Tests: No results for input(s): "TSH", "T4TOTAL", "FREET4", "T3FREE", "THYROIDAB" in the last 72 hours. Anemia Panel: No results for input(s): "VITAMINB12", "FOLATE", "FERRITIN", "TIBC", "IRON", "RETICCTPCT" in the last 72 hours. Urine analysis:    Component Value Date/Time   COLORURINE AMBER (A) 10/10/2023 1843   APPEARANCEUR CLOUDY (A) 10/10/2023 1843   LABSPEC 1.020 10/10/2023 1843   PHURINE 6.0 10/10/2023 1843   GLUCOSEU NEGATIVE 10/10/2023 1843   HGBUR NEGATIVE 10/10/2023 1843   BILIRUBINUR NEGATIVE 10/10/2023 1843   BILIRUBINUR Negative 02/08/2023 1545   KETONESUR 20 (A) 10/10/2023 1843   PROTEINUR NEGATIVE 10/10/2023 1843   UROBILINOGEN 0.2 02/08/2023 1545   UROBILINOGEN 0.2 02/06/2017 1236   NITRITE NEGATIVE 10/10/2023 1843   LEUKOCYTESUR MODERATE (A) 10/10/2023 1843    Radiological Exams on Admission: Personally reviewed  CT Head Wo  Contrast Result Date: 10/13/2023 CLINICAL DATA:  Mental status change, unknown cause. EXAM: CT HEAD WITHOUT CONTRAST TECHNIQUE: Contiguous axial images were obtained from the base of the skull through the vertex without intravenous contrast. RADIATION DOSE REDUCTION: This exam was performed according to the departmental dose-optimization program which includes automated exposure control, adjustment of the mA and/or kV according to patient size and/or use of iterative reconstruction technique. COMPARISON:  Head MRI 08/18/2023 and CT 08/17/2023 FINDINGS: Brain: There is no evidence of an acute infarct, intracranial hemorrhage, mass, midline shift, or extra-axial fluid collection. Patchy to confluent hypodensities in the cerebral white matter have likely not significantly changed from the prior CT allowing for differences in technique and are nonspecific but compatible with moderate chronic small vessel ischemic disease. Moderately age advanced cerebral atrophy is again noted. Vascular: Calcified atherosclerosis at the skull base. No hyperdense vessel. Skull: No acute fracture or suspicious lesion. Sinuses/Orbits: Mild mucosal thickening in the paranasal sinuses. Clear mastoid air cells. Bilateral cataract extraction. Other: None. IMPRESSION: 1. No evidence of acute intracranial abnormality. 2. Moderate chronic small vessel ischemic disease and cerebral atrophy. Electronically Signed   By: Sebastian Ache M.D.   On: 10/13/2023 14:54    EKG: Independently reviewed.  Normal sinus rhythm with nonspecific T wave changes.  QTc 482 ms.     Assessment and Plan:   Principal Problem:   Seizure (HCC)    1.  Recurrent seizure like activity: Seen by neurology and admitting for 48-hour continuous EEG.  Previous study did not show any seizure activity in January.  Received loading dose of IV Keppra in the ED-resume home dose 750 mg twice daily per neurology.   2.  Parkinson's disease: Resume home medications  3.   Hypertension: Resume home medications, as needed hydralazine for fluctuating BP/hypertension episodes-likely has underlying autonomic neuropathy related to Parkinson's.  4.  Hypokalemia: Replace with IV and p.o. given QTc somewhat prolonged at 482 ms.  5.  Abnormal UA, UTI diagnosed recently: Patient receiving day 3/ 5 of ciprofloxacin dosing.  Urine culture from 3/17 resulted in Aerococcus species and susceptibility test not available per report.  Will complete antibiotic course -stop date 3/21.  Replace potassium and monitor QTc while on fluoroquinolones.  6.  Anxiety disorder: Resume clonazepam at 0.5 Mg (0.25 Mg unable to administer here)-watch for sedation and titrate dose as needed.  DVT prophylaxis: Lovenox  Code Status: DNR per daughter (apparently has a yellow form at home)   .Health care proxy is daughter Adela Lank  Patient/Family Communication: Discussed with patient and all questions answered to satisfaction.  Consults called: Neurology Admission status :Patient will be admitted under OBSERVATION status.The patient's presenting symptoms, physical exam findings, and initial radiographic and laboratory data in the context of their medical condition is felt to place them at low risk for further clinical deterioration. Furthermore, it is anticipated that the patient will be medically stable for discharge from the hospital within 2 midnights of hospital stay.       Alessandra Bevels MD [facility] Pager in [place]  If 7PM-7AM, please contact night-coverage [email or web address]   10/13/2023, 4:49 PM

## 2023-10-13 NOTE — ED Notes (Signed)
 Phlebotomy at bedside.

## 2023-10-13 NOTE — ED Notes (Signed)
 Awaiting for pharmacy to verify Keppra.

## 2023-10-13 NOTE — ED Notes (Signed)
Got patient into a gown on the monitor did EKG shown to Dr Lockwood patient is resting with call bell in reach  

## 2023-10-13 NOTE — Telephone Encounter (Signed)
 Post ED Visit - Positive Culture Follow-up  Culture report reviewed by antimicrobial stewardship pharmacist: [facility] []  Enzo Bi, Pharm.D. []  Celedonio Miyamoto, [address].D., BCPS AQ-ID []  Garvin Fila, Pharm.D., BCPS [x]  Georgina Pillion, Pharm.D., BCPS []  [place], [address].D., BCPS, AAHIVP []  Estella Husk, Pharm.D., BCPS, AAHIVP []  Lysle Pearl, PharmD, BCPS []  Phillips Climes, PharmD, BCPS []  Agapito Games, PharmD, BCPS []  Verlan Friends, PharmD []  Mervyn Gay, PharmD, BCPS []  Vinnie Level, PharmD  [facility] []  Len Childs, PharmD []  Greer Pickerel, PharmD []  Adalberto Cole, PharmD []  Perlie Gold, Rph []  Lonell Face) Jean Rosenthal, PharmD []  Earl Many, PharmD []  Junita Push, PharmD []  Dorna Leitz, PharmD []  Terrilee Files, PharmD []  Lynann Beaver, PharmD []  Keturah Barre, PharmD []  Loralee Pacas, PharmD []  Bernadene Person, PharmD   Positive urine culture Treated with ciprofloxacin, organism sensitive to the same and no further patient follow-up is required at this time.  Sandria Senter 10/13/2023, 1:47 PM

## 2023-10-13 NOTE — ED Notes (Signed)
 Neurology at bedside.

## 2023-10-13 NOTE — ED Notes (Addendum)
 Pt having seizure.  MD Gabe at bedside along with this RN.  Lasted about two minutes.

## 2023-10-13 NOTE — ED Notes (Signed)
 Pt currently post-ictal.

## 2023-10-13 NOTE — Progress Notes (Signed)
 LTM EEG hooked up and running - no initial skin breakdown - push button tested - [facility] is NOT monitoring at this time due to patient being in the ED

## 2023-10-13 NOTE — Consult Note (Signed)
 NEUROLOGY CONSULT NOTE   Date of service: October 13, 2023 Patient Name: [name] MRN:  [id number] DOB:  10/09/1945 Chief Complaint: "Multiple seizures" Requesting Provider: Gerhard Munch, MD  History of Present Illness  [name] is a 78 y.o. female with hx of Bell's palsy in 1985, bilateral sensorineural hearing loss, DVT in the early 2000's, dysmetabolic syndrome X, dysphagia, HTN, GAD, GERD, hypersomnia, hypokalemia, mild neurocognitive disorder, HLD, orthostasis, Parkinson's disease, vascular Parkinsonism, osteoporosis, recurrent UTI, REM sleep behavior disorder, syncope and DM2 who was brought in after multiple seizures.  Patient's daughter is at bedside reports that patient had seizures a decade ago when she was on Wellbutrin.  She was started on an antiepileptic and Wellbutrin was weaned off and she was able to come off of AEDs.  Daughter reports that a couple months ago, patient has been having episodes concerning for seizures again.  Initially the episodes were happening about once every couple weeks but over the last week or so, she has been having episodes 3-4 times a day.  She was evaluated by Dr. Lurena Joiner Tat on a home video visit and was directed to the ED.  Daughter reports that about 5 days ago, patient was diagnosed with a UTI and was started on ciprofloxacin.  Daughter reports that her Keppra was increased from 500 mg twice daily to 750 mg twice daily starting last Thursday.  Her typical episode is her arms are up in the air and contracted, her face turns to the right.  Daughter is not sure if she has any gaze deviation or if her eyes are open.  She is gurgling during the episode.  She has some saliva at her mouth after the episode.  The episode can last up to 5 minutes.  She is snoring after the episode and sleeps for several hours.  Daughter reported the patient had about 2-3 episodes today.  She had 1 witnessed event in the ED.  No history of alcohol use,  denies any significant head injury with loss of consciousness.  Daughter denies any history of brain infection, brain surgery, stroke, ICH, brain tumors.  Patient does not drink alcohol, and she does not have a prior history of alcohol use.  Patient does not use any recreational substances.  Of note, patient was admitted about 2 months ago with seizure-like episodes and was put up on continuous EEG for 2 days and no spells were captured.  Patient was discharged on Keppra 250 twice daily.  She also had an MRI of the brain during that admission which was negative for any acute intracranial abnormalities.  There was no definitive seizure focus that was identified.  She was noted to have advanced renal atrophy for age.   ROS  Unable to ascertain due to encephalopathy.  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  Really didn't use.   Weight loss 46 pounds since last visit.  NPSG 2012:  AHI 3/hr (normal), no PLMS, no obvious cause for symptoms        Hypokalemia 07/19/2023    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   Mixed hyperlipidemia 01/31/2007   Morbid obesity 02/16/2007   Orthostasis 12/07/2011   Osteoporosis 01/31/2017   Parkinson's disease    Personal history of thrombophlebitis    Recurrent UTI 05/27/2015   REM sleep behavior disorder    Seizures (HCC)    Syncope 12/06/2011   Type 2 diabetes mellitus with hyperglycemia 12/03/2014   Urinary urgency 01/15/2022   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    Past Surgical History:  Procedure Laterality Date   bladder tack  11/1995   BREAST BIOPSY  05/20/2000   bilaterally   BREAST CYST ASPIRATION  12/1998   bilaterally   CARDIAC CATHETERIZATION  2004   CATARACT EXTRACTION W/ INTRAOCULAR LENS  IMPLANT, BILATERAL     FRACTURE SURGERY  02/09/2006   bilateral elbows   FRACTURE SURGERY     right knee   FRACTURE SURGERY     tib plateau   KNEE ARTHROSCOPY  12/12/2002   right    LAPAROSCOPIC GASTRIC BANDING  2008   SEPTOPLASTY  08/1998   with antral window    TONSILLECTOMY AND ADENOIDECTOMY  1970   TUBAL LIGATION  1970's    Family History: Family History  Problem Relation Age of Onset   Asthma Mother    COPD Father    Heart disease Father    Diabetes Sister    Vision loss Sister        legally blind   Hypertension Child    Hyperlipidemia Child    Diabetes Child    Diabetes Son    Breast cancer Neg Hx     Social History  reports that she has never smoked. She has never used smokeless tobacco. She reports that she does not drink alcohol and does not use drugs.  Allergies  Allergen Reactions   Codeine Sulfate     REACTION: hallucinations   Penicillins Anaphylaxis, Swelling and Rash    Tolerates Rocephin   Amoxapine And Related    Wellbutrin [Bupropion] Other (See Comments)    Seizures and hallucinations    Medications   Current Facility-Administered Medications:    potassium chloride 10 mEq in 100 mL IVPB, 10 mEq, Intravenous, Q1 Hr x 6, Mabe, Gerald, MD  Current Outpatient  Medications:    ACCU-CHEK GUIDE test strip, USE UP TO 4 TIMES DAILY AS DIRECTED, Disp: 300 strip, Rfl: 1   Accu-Chek Softclix Lancets lancets, USE UP TO 4 TIMES DAILY AS DIRECTED, Disp: 200 each, Rfl: 1   acetaminophen (TYLENOL) 500 MG tablet, Take 500 mg by mouth every 6 (six) hours as needed for moderate pain (pain score 4-6) or mild pain (pain score 1-3)., Disp: , Rfl:    aspirin 81 MG tablet, Take 81 mg by mouth daily., Disp: , Rfl:    blood glucose meter kit and supplies KIT, Dispense based on patient and insurance preference. Use up to four times daily as directed. (FOR ICD-9  250.00, 250.01)., Disp: 1 each, Rfl: 0   calcium carbonate (TUMS EX) 750 MG chewable tablet, Chew 1 tablet by mouth as needed., Disp: , Rfl:    carbidopa-levodopa (SINEMET IR) 25-100 MG tablet, TAKE 3 TABLETS BY MOUTH 3 TIMES DAILY. (Patient taking differently: Take 3 tablets by mouth 3 (three) times daily.), Disp: 810 tablet, Rfl: 0   cetirizine (ZYRTEC) 10 MG tablet, Take 10 mg by mouth at bedtime., Disp: , Rfl:    Cholecalciferol (VITAMIN D3) 1000 UNITS CAPS, Take 1 capsule by mouth daily., Disp: , Rfl:    ciprofloxacin (CIPRO) 500 MG tablet, Take 1 tablet (500 mg total) by mouth every 12 (twelve) hours., Disp: 10 tablet, Rfl: 0   clonazePAM (KLONOPIN) 0.5 MG tablet, TAKE 1/2 TABLET BY MOUTH AT BEDTIME AND 1/2 DAILY AS NEEDED (Patient taking differently: TAKE 1/2 TABLET BY MOUTH AT BEDTIME), Disp: , Rfl:    donepezil (ARICEPT) 5 MG tablet, TAKE 1 TABLET BY MOUTH EVERYDAY AT BEDTIME, Disp: 90 tablet, Rfl: 0   escitalopram (LEXAPRO) 10 MG tablet, TAKE 1 TABLET BY MOUTH EVERY DAY (Patient taking differently: Take 10 mg by mouth in the morning.), Disp: 90 tablet, Rfl: 0   levETIRAcetam (KEPPRA) 250 MG tablet, Take 1 tablet (250 mg total) by mouth 2 (two) times daily., Disp: 180 tablet, Rfl: 0   levETIRAcetam (KEPPRA) 500 MG tablet, Take 1 tablet (500 mg total) by mouth 2 (two) times daily., Disp: 180 tablet, Rfl: 0    memantine (NAMENDA) 10 MG tablet, Take 1 tablet (10 mg total) by mouth 2 (two) times daily., Disp: 180 tablet, Rfl: 1   montelukast (SINGULAIR) 10 MG tablet, TAKE 1 TABLET (10 MG TOTAL) BY MOUTH AT BEDTIME. FOR ALLERGIES, Disp: 90 tablet, Rfl: 3   rosuvastatin (CRESTOR) 5 MG tablet, TAKE 1 TABLET (5 MG TOTAL) BY MOUTH EVERY EVENING. FOR CHOLESTEROL., Disp: 90 tablet, Rfl: 3  Vitals   Vitals:   10/13/23 1145 10/13/23 1215 10/13/23 1230 10/13/23 1300  BP: 123/69 137/64 (!) 140/64 (!) 170/73  Pulse: 63 62 61 65  Resp: 13 13 20 14   Temp:      TempSrc:      SpO2: 100% 100% 100% 100%    There is no height or weight on file to calculate BMI.  Physical Exam   General: Laying comfortably in bed; in no acute distress.  HENT: Normal oropharynx and mucosa. Normal external appearance of ears and nose.  Neck: Supple, no pain or tenderness  CV: No JVD. No peripheral edema.  Pulmonary: Symmetric Chest rise. Normal respiratory effort.  Abdomen: Soft to touch, non-tender.  Ext: No cyanosis, edema, or deformity  Skin: No rash. Normal palpation of skin.   Musculoskeletal: Normal digits and nails by inspection. No clubbing.   Neurologic Examination  Mental status/Cognition: Drowsy, opens eyes to loud voice and keeps eyes open for several seconds.  She looks around spontaneously and makes sustained eye contact.  She is oriented to self only.  She has very poor attention. Speech/language: Dysarthric, barely legible speech.  Nonfluent.  She only follows very few one-step commands but not all.  She is unable to repeat.  She is able to identify thumb but no other fingers or objects identified by patient.   Cranial nerves:   CN II Pupils equal and reactive to light, she blinks to threat bilaterally.   CN III,IV,VI EOM intact, no gaze preference or deviation, no nystagmus   CN V normal sensation in V1, V2, and V3 segments  bilaterally   CN VII Left facial droop that involves the upper face consistent with  provided history of Bell's palsy.   CN VIII normal hearing to speech    CN IX & X Unable to assess but she seems to protecting her airway.   CN XI Head is midline, she is spontaneously turning her head left or right.   CN XII She does not protrude tongue on command.   Sensory/motor:  Muscle bulk: Poor, tone normal Unable to do detailed strength testing secondary to encephalopathy.  She is moving bilateral upper extremities spontaneously and equally and antigravity.  She has 4 out of 5 grip strength bilaterally. She has antigravity movement in bilateral lower extremity noted on Babinski's. She localizes to proximal pinch in all extremities.  Coordination/Complex Motor:  Unable to assess coordination. Labs/Imaging/Neurodiagnostic studies   CBC:  Recent Labs  Lab 11/02/23 1621 10/13/23 1319  WBC 9.6 9.3  NEUTROABS 7.9*  --   HGB 11.9* 10.8*  HCT 37.3 32.9*  MCV 90.3 87.0  PLT 187 212   Basic Metabolic Panel:  Lab Results  Component Value Date   NA 135 10/13/2023   K 2.9 (L) 10/13/2023   CO2 27 10/13/2023   GLUCOSE 117 (H) 10/13/2023   BUN 7 (L) 10/13/2023   CREATININE 0.60 10/13/2023   CALCIUM 8.8 (L) 10/13/2023   GFRNONAA >60 10/13/2023   GFRAA >60 06/13/2016   Lipid Panel:  Lab Results  Component Value Date   LDLCALC 34 04/26/2023   HgbA1c:  Lab Results  Component Value Date   HGBA1C 7.2 (H) 04/26/2023   Urine Drug Screen:     Component Value Date/Time   LABOPIA NONE DETECTED 08/08/2023 1242   COCAINSCRNUR NONE DETECTED 08/08/2023 1242   LABBENZ NONE DETECTED 08/08/2023 1242   AMPHETMU NONE DETECTED 08/08/2023 1242   THCU NONE DETECTED 08/08/2023 1242   LABBARB NONE DETECTED 08/08/2023 1242    Alcohol Level     Component Value Date/Time   ETH <10 08/08/2023 1045   INR  Lab Results  Component Value Date   INR 1.1 08/18/2023   APTT  Lab Results  Component Value Date   APTT 28 08/18/2023   AED levels: No results found for: "PHENYTOIN",  "ZONISAMIDE", "LAMOTRIGINE", "LEVETIRACETA"  CT Head without contrast(Personally reviewed): CTH was negative for a large hypodensity concerning for a large territory infarct or hyperdensity concerning for an ICH  MRI Brain from 08/18/2023 (Personally reviewed): 1. No acute intracranial process. No definite seizure focus is identified. 2. Advanced cerebral atrophy for age.  Neurodiagnostics cEEG:  Pending  ASSESSMENT   VALOR TURBERVILLE is a 78 y.o. female with hx of Bell's palsy in 1985, bilateral sensorineural hearing loss, DVT in the early 2000's, dysmetabolic syndrome X, dysphagia, HTN, GAD, GERD, hypersomnia, hypokalemia, mild neurocognitive disorder, HLD, orthostasis, Parkinson's disease, vascular Parkinsonism, osteoporosis, recurrent UTI, REM sleep behavior disorder, syncope and DM2 who was brought in after multiple seizures.  Daughter had captured part of her event on her video and review of the captured video is most suggiestive of epileptic seizure.  Since she is having several episodes a day, there is a reasonable chance that we can capture a spell on LTM EEG.  Etiology of her seizures is unclear, perhaps she has new onset epilepsy in the setting of dementia.  Seizure threshold could have been further lowered by her recent UTI diagnosis.  RECOMMENDATIONS  -Continuous EEG for spell capture.  I spoke with daughter that patient will probably needs  mitts to prevent her from pulling on EEG leads.  Daughter is agreeable to that. -Hold off on MRI brain for now she had an MRI on 08/18/2023 and this was done after she had several seizures. -Seizure precautions with seizure pads. -Agree with Keppra load 3 g IV once.  Continue home Keppra maintenance at 750 mg twice daily. -Neurology will continue to follow.  ______________________________________________________________________  Plan discussed with patient's daughter at bedside and with Dr. Jeraldine Loots and Dr. Phineas Real with the ED  team.  Signed, Erick Blinks, MD [facility]

## 2023-10-13 NOTE — ED Triage Notes (Signed)
 Pt BIB [facility] from home due to seizures.  Pt had one seizure at home with family; they called neurologist.  Alyssa Grove noticed a brief second seizure, then third seizure and got 5 midazolam IM.  22g right hand. VS BP 160/80, HR 70, CBG 113, SpO2 90% RA placed on 2L nasal cannula for comfort.

## 2023-10-14 ENCOUNTER — Inpatient Hospital Stay (HOSPITAL_COMMUNITY)

## 2023-10-14 ENCOUNTER — Inpatient Hospital Stay: Admitting: Internal Medicine

## 2023-10-14 DIAGNOSIS — Z7982 Long term (current) use of aspirin: Secondary | ICD-10-CM | POA: Diagnosis not present

## 2023-10-14 DIAGNOSIS — Z66 Do not resuscitate: Secondary | ICD-10-CM | POA: Diagnosis present

## 2023-10-14 DIAGNOSIS — Z888 Allergy status to other drugs, medicaments and biological substances status: Secondary | ICD-10-CM | POA: Diagnosis not present

## 2023-10-14 DIAGNOSIS — Z825 Family history of asthma and other chronic lower respiratory diseases: Secondary | ICD-10-CM | POA: Diagnosis not present

## 2023-10-14 DIAGNOSIS — F0390 Unspecified dementia without behavioral disturbance: Secondary | ICD-10-CM | POA: Diagnosis not present

## 2023-10-14 DIAGNOSIS — M810 Age-related osteoporosis without current pathological fracture: Secondary | ICD-10-CM | POA: Diagnosis present

## 2023-10-14 DIAGNOSIS — R569 Unspecified convulsions: Secondary | ICD-10-CM | POA: Diagnosis not present

## 2023-10-14 DIAGNOSIS — R0989 Other specified symptoms and signs involving the circulatory and respiratory systems: Secondary | ICD-10-CM | POA: Diagnosis not present

## 2023-10-14 DIAGNOSIS — F0284 Dementia in other diseases classified elsewhere, unspecified severity, with anxiety: Secondary | ICD-10-CM | POA: Diagnosis present

## 2023-10-14 DIAGNOSIS — G40909 Epilepsy, unspecified, not intractable, without status epilepticus: Secondary | ICD-10-CM | POA: Diagnosis present

## 2023-10-14 DIAGNOSIS — Z880 Allergy status to penicillin: Secondary | ICD-10-CM | POA: Diagnosis not present

## 2023-10-14 DIAGNOSIS — G20A1 Parkinson's disease without dyskinesia, without mention of fluctuations: Secondary | ICD-10-CM

## 2023-10-14 DIAGNOSIS — Z8249 Family history of ischemic heart disease and other diseases of the circulatory system: Secondary | ICD-10-CM | POA: Diagnosis not present

## 2023-10-14 DIAGNOSIS — Z833 Family history of diabetes mellitus: Secondary | ICD-10-CM | POA: Diagnosis not present

## 2023-10-14 DIAGNOSIS — E8881 Metabolic syndrome: Secondary | ICD-10-CM | POA: Diagnosis present

## 2023-10-14 DIAGNOSIS — H903 Sensorineural hearing loss, bilateral: Secondary | ICD-10-CM | POA: Diagnosis present

## 2023-10-14 DIAGNOSIS — E119 Type 2 diabetes mellitus without complications: Secondary | ICD-10-CM | POA: Diagnosis present

## 2023-10-14 DIAGNOSIS — Z885 Allergy status to narcotic agent status: Secondary | ICD-10-CM | POA: Diagnosis not present

## 2023-10-14 DIAGNOSIS — Z7401 Bed confinement status: Secondary | ICD-10-CM | POA: Diagnosis not present

## 2023-10-14 DIAGNOSIS — F0283 Dementia in other diseases classified elsewhere, unspecified severity, with mood disturbance: Secondary | ICD-10-CM | POA: Diagnosis present

## 2023-10-14 DIAGNOSIS — E782 Mixed hyperlipidemia: Secondary | ICD-10-CM | POA: Diagnosis present

## 2023-10-14 DIAGNOSIS — I10 Essential (primary) hypertension: Secondary | ICD-10-CM | POA: Diagnosis not present

## 2023-10-14 DIAGNOSIS — Z8672 Personal history of thrombophlebitis: Secondary | ICD-10-CM | POA: Diagnosis not present

## 2023-10-14 DIAGNOSIS — D72829 Elevated white blood cell count, unspecified: Secondary | ICD-10-CM | POA: Diagnosis not present

## 2023-10-14 DIAGNOSIS — I517 Cardiomegaly: Secondary | ICD-10-CM | POA: Diagnosis not present

## 2023-10-14 DIAGNOSIS — Z79899 Other long term (current) drug therapy: Secondary | ICD-10-CM | POA: Diagnosis not present

## 2023-10-14 DIAGNOSIS — Z515 Encounter for palliative care: Secondary | ICD-10-CM | POA: Diagnosis not present

## 2023-10-14 DIAGNOSIS — F02C18 Dementia in other diseases classified elsewhere, severe, with other behavioral disturbance: Secondary | ICD-10-CM | POA: Diagnosis not present

## 2023-10-14 DIAGNOSIS — E876 Hypokalemia: Secondary | ICD-10-CM | POA: Diagnosis present

## 2023-10-14 DIAGNOSIS — K219 Gastro-esophageal reflux disease without esophagitis: Secondary | ICD-10-CM | POA: Diagnosis present

## 2023-10-14 LAB — URINALYSIS, W/ REFLEX TO CULTURE (INFECTION SUSPECTED)
Bilirubin Urine: NEGATIVE
Glucose, UA: NEGATIVE mg/dL
Hgb urine dipstick: NEGATIVE
Ketones, ur: >80 mg/dL — AB
Leukocytes,Ua: NEGATIVE
Nitrite: NEGATIVE
Protein, ur: NEGATIVE mg/dL
RBC / HPF: NONE SEEN RBC/hpf (ref 0–5)
Specific Gravity, Urine: >1.03 — ABNORMAL HIGH (ref 1.005–1.030)
pH: 6 (ref 5.0–8.0)

## 2023-10-14 LAB — BASIC METABOLIC PANEL
Anion gap: 12 (ref 5–15)
BUN: 9 mg/dL (ref 8–23)
CO2: 22 mmol/L (ref 22–32)
Calcium: 8.9 mg/dL (ref 8.9–10.3)
Chloride: 101 mmol/L (ref 98–111)
Creatinine, Ser: 0.73 mg/dL (ref 0.44–1.00)
GFR, Estimated: >60 mL/min (ref >60)
Glucose, Bld: 111 mg/dL — ABNORMAL HIGH (ref 70–99)
Potassium: 3.8 mmol/L (ref 3.5–5.1)
Sodium: 135 mmol/L (ref 135–145)

## 2023-10-14 LAB — RAPID URINE DRUG SCREEN, HOSP PERFORMED
Amphetamines: NOT DETECTED
Barbiturates: NOT DETECTED
Benzodiazepines: POSITIVE — AB
Cocaine: NOT DETECTED
Opiates: NOT DETECTED
Tetrahydrocannabinol: NOT DETECTED

## 2023-10-14 LAB — MAGNESIUM: Magnesium: 1.8 mg/dL (ref 1.7–2.4)

## 2023-10-14 MED ORDER — LEVETIRACETAM 500 MG PO TABS
1000.0000 mg | ORAL_TABLET | Freq: Two times a day (BID) | ORAL | Status: DC
  Administered 2023-10-14 – 2023-10-17 (×7): 1000 mg via ORAL
  Filled 2023-10-14 (×7): qty 2

## 2023-10-14 MED ORDER — AMLODIPINE BESYLATE 10 MG PO TABS
10.0000 mg | ORAL_TABLET | Freq: Every day | ORAL | Status: DC
  Administered 2023-10-14 – 2023-10-17 (×4): 10 mg via ORAL
  Filled 2023-10-14 (×3): qty 1
  Filled 2023-10-14: qty 2

## 2023-10-14 MED ORDER — HYDRALAZINE HCL 25 MG PO TABS: 25.0000 mg | ORAL_TABLET | Freq: Four times a day (QID) | ORAL | Status: DC | PRN

## 2023-10-14 MED ORDER — ENSURE ENLIVE PO LIQD
237.0000 mL | Freq: Two times a day (BID) | ORAL | Status: DC
  Administered 2023-10-14 – 2023-10-17 (×7): 237 mL via ORAL
  Filled 2023-10-14: qty 237

## 2023-10-14 MED ORDER — LORAZEPAM 2 MG/ML IJ SOLN
2.0000 mg | Freq: Once | INTRAMUSCULAR | Status: DC | PRN
Start: 2023-10-14 — End: 2023-10-18

## 2023-10-14 NOTE — ED Notes (Signed)
 Meds were given to pt crushed up in apple sauce.

## 2023-10-14 NOTE — Procedures (Addendum)
 Patient Name: [name]  MRN: [id number]  Epilepsy Attending: Charlsie Quest  Referring Physician/Provider: Erick Blinks, MD  Duration: 10/13/2023 1534 to 10/14/2023 1630  Patient history: 78 y.o. female who was brought in after multiple seizures. EEG to evaluate for seizure  Level of alertness: Awake, asleep  AEDs during EEG study: LEV, Clonazepam  Technical aspects: This EEG study was done with scalp electrodes positioned according to the 10-20 International system of electrode placement. Electrical activity was reviewed with band pass filter of 1-70Hz , sensitivity of 7 uV/mm, display speed of 24mm/sec with a 60Hz  notched filter applied as appropriate. EEG data were recorded continuously and digitally stored.  Video monitoring was available and reviewed as appropriate.  Description: The posterior dominant rhythm consists of 8-9 Hz activity of moderate voltage (25-35 uV) seen predominantly in posterior head regions, symmetric and reactive to eye opening and eye closing.  IV Ativan was administered on 08/17/2023 at around 2116.  Subsequently patient fell asleep. Sleep was characterized by vertex waves, sleep symptoms (12 to 14 hours), maximal frontocentral region). EEG also showed continuous generalized 3-5 Hz theta-delta slowing admixed within excessive amount of sharply contoured 15 to 18 Hz beta activity with irregular morphology distributed symmetrically and diffusely.  Hyperventilation and photic stimulation were not performed.     EEG was disconnected between 10/14/2023 1332 to 1421.    ABNORMALITY - Continuous slow, generalized - Excessive beta, generalized   IMPRESSION: This study is suggestive of mild to moderate diffuse encephalopathy. The excessive beta activity seen in the background is most likely due to the effect of benzodiazepine use.   No seizures or epileptiform discharges were seen during the study.  Audry Pecina Annabelle Harman

## 2023-10-14 NOTE — Evaluation (Signed)
 Physical Therapy Evaluation Patient Details Name: [name] MRN: [id number] DOB: 1946/01/02 Today's Date: 10/14/2023  History of Present Illness  78 y.o. female was admitted 3/20 after multiple seizures. Recent diagnosis of UT. PMHx: Bell's palsy in 1985, bilateral sensorineural hearing loss, DVT in the early 2000's, dysmetabolic syndrome X, dysphagia, HTN, GAD, GERD, hypersomnia, hypokalemia, mild neurocognitive disorder, HLD, orthostasis, Parkinson's disease, vascular Parkinsonism, osteoporosis, recurrent UTI, REM sleep behavior disorder, syncope and DM2.    Clinical Impression  Pt admitted with above diagnosis. Daughter provided history due to impaired cognition of patient. Prior to decline leading to this admission, pt was ambulatory with hand held assist, houshold distances. Needed help with transfers more recently, and for the past ~week has been bedridden. Pt required mod assist to rise from bed today and stood at EOB with posterior instability. Could not progress with lateral steps along bed. Tolerated sitting EOB majority of session working on orientation, limits of stability and was able to progress to CGA with seated balance. Patient will benefit from continued inpatient follow up therapy, <3 hours/day. Daughter open to SNF to help regain strength and function, but may decline if pt progresses well acutely to the point she can safely care for pt at home. Will progress as tolerated. Pt currently with functional limitations due to the deficits listed below (see PT Problem List). Pt will benefit from acute skilled PT to increase their independence and safety with mobility to allow discharge.           If plan is discharge home, recommend the following: Two people to help with walking and/or transfers;Assistance with cooking/housework;Two people to help with bathing/dressing/bathroom;Direct supervision/assist for medications management;Direct supervision/assist for financial  management;Assist for transportation;Help with stairs or ramp for entrance;Assistance with feeding;Supervision due to cognitive status   Can travel by private vehicle   No (likely soon)    Equipment Recommendations None recommended by PT  Recommendations for Other Services       Functional Status Assessment Patient has had a recent decline in their functional status and demonstrates the ability to make significant improvements in function in a reasonable and predictable amount of time.     Precautions / Restrictions Precautions Precautions: Fall;Other (comment) (Seizures) Recall of Precautions/Restrictions: Impaired Restrictions Weight Bearing Restrictions Per Provider Order: No      Mobility  Bed Mobility Overal bed mobility: Needs Assistance Bed Mobility: Supine to Sit, Sit to Supine     Supine to sit: Mod assist, HOB elevated Sit to supine: Min assist   General bed mobility comments: Mod assist to rise to EOB, pt able to facilitate with cues but leans posteriorly. Min assist for trunk support and sequencing back into bed.    Transfers Overall transfer level: Needs assistance Equipment used: Ambulation equipment used Transfers: Sit to/from Stand Sit to Stand: Mod assist           General transfer comment: Stood several times, hands holding back of chair to facilitate forward weight shift, initially with mod assist but progressed to min assist. Slight posterior lean in standing. Tolerated only for short periods of time. Could not take lateral steps.    Ambulation/Gait               General Gait Details: Deferred for safety at this time  Stairs            Wheelchair Mobility     Tilt Bed    Modified Rankin (Stroke Patients Only)       Balance  Overall balance assessment: Needs assistance Sitting-balance support: No upper extremity supported, Feet supported Sitting balance-Leahy Scale: Fair Sitting balance - Comments: Initially with mod  assist, progressed to CGA at EOB. able to reach forward for targets, touch sock with foot on floor. Postural control: Posterior lean Standing balance support: Bilateral upper extremity supported Standing balance-Leahy Scale: Poor                               Pertinent Vitals/Pain Pain Assessment Pain Assessment: PAINAD Breathing: normal Negative Vocalization: none Facial Expression: smiling or inexpressive Body Language: relaxed Consolability: no need to console PAINAD Score: 0 Pain Intervention(s): Monitored during session    Home Living Family/patient expects to be discharged to:: Private residence Living Arrangements: Children Available Help at Discharge: Family;Available 24 hours/day Type of Home: House Home Access: Stairs to enter Entrance Stairs-Rails: Left Entrance Stairs-Number of Steps: 2   Home Layout: One level Home Equipment: Agricultural consultant (2 wheels);Cane - single point;Shower seat;Wheelchair Financial trader (4 wheels);Tub bench;BSC/3in1;Other (comment) (bed rails) Additional Comments: Hx provided by daughter    Prior Function Prior Level of Function : Needs assist             Mobility Comments: Daughter reports >1 month ago she could ambulate house hold distances with hand held assist. More recently has only been able to transfer with assist, and within the past 4 days has been bedridden. ADLs Comments: Daughter reports currently needs total care - has been using bed pan for past 4 days.     Extremity/Trunk Assessment   Upper Extremity Assessment Upper Extremity Assessment: Defer to OT evaluation    Lower Extremity Assessment Lower Extremity Assessment: Generalized weakness;Difficult to assess due to impaired cognition       Communication   Communication Communication: Impaired Factors Affecting Communication: Reduced clarity of speech    Cognition Arousal: Alert Behavior During Therapy: WFL for tasks assessed/performed   PT -  Cognitive impairments: Orientation, Awareness, Memory, Attention, Initiation, Sequencing, Problem solving, Safety/Judgement, History of cognitive impairments   Orientation impairments: Place, Time, Situation (eventually recalls year of birth.)                   PT - Cognition Comments: Daughter reports pt with baseline cognitive impairments but current state is much worse, noting pt sometimes cannot recall family members, and her speech has been very unclear. Following commands: Impaired Following commands impaired: Follows one step commands inconsistently     Cueing Cueing Techniques: Verbal cues, Tactile cues, Gestural cues, Visual cues     General Comments General comments (skin integrity, edema, etc.): Daughter present, supportive. Purewick soiled - bed pad replaced, front desk notified.    Exercises     Assessment/Plan    PT Assessment Patient needs continued PT services  PT Problem List Decreased strength;Decreased activity tolerance;Decreased balance;Decreased mobility;Decreased coordination;Decreased cognition;Decreased knowledge of use of DME;Decreased safety awareness;Decreased knowledge of precautions       PT Treatment Interventions DME instruction;Gait training;Functional mobility training;Therapeutic activities;Therapeutic exercise;Balance training;Cognitive remediation;Neuromuscular re-education;Patient/family education;Wheelchair mobility training    PT Goals (Current goals can be found in the Care Plan section)  Acute Rehab PT Goals Patient Stated Goal: None stated PT Goal Formulation: With family Time For Goal Achievement: 10/28/23 Potential to Achieve Goals: Fair    Frequency Min 2X/week     Co-evaluation               AM-PAC PT "6 Clicks" Mobility  Outcome Measure Help needed turning from your back to your side while in a flat bed without using bedrails?: A Little Help needed moving from lying on your back to sitting on the side of a flat  bed without using bedrails?: A Lot Help needed moving to and from a bed to a chair (including a wheelchair)?: A Lot Help needed standing up from a chair using your arms (e.g., wheelchair or bedside chair)?: A Lot Help needed to walk in hospital room?: Total Help needed climbing 3-5 steps with a railing? : Total 6 Click Score: 11    End of Session Equipment Utilized During Treatment: Gait belt Activity Tolerance: Patient tolerated treatment well Patient left: in bed;with call bell/phone within reach;with bed alarm set;with family/visitor present;with restraints reapplied (mitts on, seizure pads in place) Nurse Communication:  [employer] desk alerted, need new purewick and peri-care) PT Visit Diagnosis: Unsteadiness on feet (R26.81);Muscle weakness (generalized) (M62.81);Difficulty in walking, not elsewhere classified (R26.2);Other symptoms and signs involving the nervous system (R29.898)    Time: 6213-0865 PT Time Calculation (min) (ACUTE ONLY): 28 min   Charges:   PT Evaluation $PT Eval Moderate Complexity: 1 Mod PT Treatments $Therapeutic Activity: 8-22 mins PT General Charges $$ ACUTE PT VISIT: 1 Visit         Kathlyn Sacramento, PT, DPT [facility]  Rehabilitation Services Physical Therapist Office: [phone] Website: [email or web address]   Berton Mount 10/14/2023, 4:50 PM

## 2023-10-14 NOTE — Progress Notes (Signed)
 LTM maint complete - no skin breakdown seen. [facility] monitored, Event button test confirmed by [facility].

## 2023-10-14 NOTE — Plan of Care (Signed)

## 2023-10-14 NOTE — Progress Notes (Signed)
 PROGRESS NOTE  [name] WUJ:[id number] DOB: 1945/11/26   PCP: Doreene Nest, NP  Patient is from: Home.  Lives with daughter.  DOA: 10/13/2023 LOS: 0  Chief complaints Chief Complaint  Patient presents with   Seizures     Brief Narrative / Interim history: 78 year old F with PMH of Parkinson's disease, Bell's palsy, HTN, seizure disorder, anxiety and recent UTI brought to ED by EMS due to seizure-like activity and admitted for the same.  In ED, she had witnessed seizure-like activity.  Teleneurology consulted.  She was loaded with 3 g of IV Keppra.  Neurology recommended admission for 48-hour continuous EEG.  CT head without acute finding.  She was also hypokalemic to 2.9.  Subjective: Seen and examined earlier this morning.  No major events overnight of this morning.  Patient is awake but only oriented to self and not able to provide history.  Follows some commands.  Responds no to pain.  Objective: Vitals:   10/14/23 0800 10/14/23 0930 10/14/23 1030 10/14/23 1100  BP: (!) 147/85 (!) 159/77  (!) 170/149  Pulse: 80 79  86  Resp: 14 17  (!) 23  Temp:   98.7 F (37.1 C)   TempSrc:   Oral   SpO2: 100% 99%  98%    Examination:  GENERAL: No apparent distress.  Nontoxic. HEENT: MMM.  Vision and hearing grossly intact.  NECK: Supple.  No apparent JVD.  RESP:  No IWOB.  Fair aeration bilaterally. CVS:  RRR. Heart sounds normal.  ABD/GI/GU: BS+. Abd soft, NTND.  MSK/EXT:  Moves extremities. No apparent deformity. No edema.  SKIN: no apparent skin lesion or wound NEURO: Awake, alert and oriented to self.  Follows some commands.  No apparent focal neuro deficit but limited exam. PSYCH: Calm.  No distress or agitation.  Consultants:  Surgery  Procedures: LTM EEG-suggests mild to moderate diffuse encephalopathy but no seizure or epileptiform discharge.  Microbiology summarized: None  Assessment and plan: Recurrent seizure like activity: Witnessed episodes at  home and ED.  Loaded with 3 g Keppra in ED and continued on home Keppra.  LTM EEG negative for seizure or epileptiform discharge which was the case about 3 months ago as well.  Others on differential include delirium and polypharmacy. -Continue home Keppra 750 mg twice daily -Seizure precaution -IV Ativan 2 g once as needed seizure activity lasting more than 2 minutes -Follow continuous EEG and further neuro recs.  Recent diagnosis of UTI: Seen in ED on 3/17.  Urine culture with Aerococcus species.  Started on Cipro.  It is unclear if this is true UTI or bacteria.  UA negative this admission. -Discontinue Cipro.  Risk outweighs benefit especially with hypokalemia   Uncontrolled hypertension:  Not on antihypertensive meds at home.  BP elevated. -Start amlodipine 10 mg daily -P.o. hydralazine as needed  Parkinson's disease: Stable -Continue home Sinemet  Hypokalemia: -Monitor replenish as appropriate  Cognitive impairment/anxiety/mood disorder -Continue home Aricept, memantine, Lexapro and Remeron  Hyperlipidemia -Continue home Crestor  There is no height or weight on file to calculate BMI.          DVT prophylaxis:  enoxaparin (LOVENOX) injection 40 mg Start: 10/14/23 1000  Code Status: DNR/DNI Family Communication: None at bedside Level of care: Telemetry Medical Status is: Inpatient Remains inpatient appropriate because: Recurrent seizure-like activity   Final disposition: To be determined   55 minutes with more than 50% spent in reviewing records, counseling patient/family and coordinating care.   Sch Meds:  Scheduled Meds:  amLODipine  10 mg Oral Daily   aspirin EC  81 mg Oral Daily   carbidopa-levodopa  3 tablet Oral TID   cholecalciferol  1,000 Units Oral Daily   clonazePAM  0.5 mg Oral QHS   donepezil  5 mg Oral QHS   enoxaparin (LOVENOX) injection  40 mg Subcutaneous Q24H   escitalopram  10 mg Oral Daily   levETIRAcetam  750 mg Oral BID   memantine  10  mg Oral BID   mirtazapine  15 mg Oral QHS   montelukast  10 mg Oral QHS   rosuvastatin  5 mg Oral QPM   Continuous Infusions: PRN Meds:.acetaminophen **OR** acetaminophen, acetaminophen, albuterol, calcium carbonate, hydrALAZINE, LORazepam, ondansetron **OR** ondansetron (ZOFRAN) IV  Antimicrobials: Anti-infectives (From admission, onward)    Start     Dose/Rate Route Frequency Ordered Stop   10/14/23 0500  ciprofloxacin (CIPRO) tablet 500 mg  Status:  Discontinued        500 mg Oral Every 12 hours 10/13/23 1727 10/14/23 1119   10/13/23 1700  ciprofloxacin (CIPRO) tablet 500 mg  Status:  Discontinued        500 mg Oral Every 12 hours 10/13/23 1646 10/13/23 1727        I have personally reviewed the following labs and images: CBC: Recent Labs  Lab 10/10/23 1621 10/13/23 1319  WBC 9.6 9.3  NEUTROABS 7.9*  --   HGB 11.9* 10.8*  HCT 37.3 32.9*  MCV 90.3 87.0  PLT 187 212   BMP &GFR Recent Labs  Lab 10/10/23 1621 10/13/23 1316 10/13/23 1319 10/14/23 0500  NA 137  --  135 135  K 3.7  --  2.9* 3.8  CL 100  --  98 101  CO2 28  --  27 22  GLUCOSE 129*  --  117* 111*  BUN 16  --  7* 9  CREATININE 0.52 0.56 0.60 0.73  CALCIUM 9.1  --  8.8* 8.9  MG  --   --  1.7  --    Estimated Creatinine Clearance: 50.6 mL/min (by C-G formula based on SCr of 0.73 mg/dL). Liver & Pancreas: Recent Labs  Lab 10/10/23 1621  AST 7*  ALT <5  ALKPHOS 69  BILITOT 1.0  PROT 5.9*  ALBUMIN 3.2*   No results for input(s): "LIPASE", "AMYLASE" in the last 168 hours. No results for input(s): "AMMONIA" in the last 168 hours. Diabetic: No results for input(s): "HGBA1C" in the last 72 hours. Recent Labs  Lab 10/10/23 1548  GLUCAP 113*   Cardiac Enzymes: No results for input(s): "CKTOTAL", "CKMB", "CKMBINDEX", "TROPONINI" in the last 168 hours. No results for input(s): "PROBNP" in the last 8760 hours. Coagulation Profile: No results for input(s): "INR", "PROTIME" in the last 168  hours. Thyroid Function Tests: No results for input(s): "TSH", "T4TOTAL", "FREET4", "T3FREE", "THYROIDAB" in the last 72 hours. Lipid Profile: No results for input(s): "CHOL", "HDL", "LDLCALC", "TRIG", "CHOLHDL", "LDLDIRECT" in the last 72 hours. Anemia Panel: No results for input(s): "VITAMINB12", "FOLATE", "FERRITIN", "TIBC", "IRON", "RETICCTPCT" in the last 72 hours. Urine analysis:    Component Value Date/Time   COLORURINE YELLOW 10/14/2023 0905   APPEARANCEUR CLEAR 10/14/2023 0905   LABSPEC >1.030 (H) 10/14/2023 0905   PHURINE 6.0 10/14/2023 0905   GLUCOSEU NEGATIVE 10/14/2023 0905   HGBUR NEGATIVE 10/14/2023 0905   BILIRUBINUR NEGATIVE 10/14/2023 0905   BILIRUBINUR Negative 02/08/2023 1545   KETONESUR >80 (A) 10/14/2023 0905   PROTEINUR NEGATIVE 10/14/2023 0905  UROBILINOGEN 0.2 02/08/2023 1545   UROBILINOGEN 0.2 02/06/2017 1236   NITRITE NEGATIVE 10/14/2023 0905   LEUKOCYTESUR NEGATIVE 10/14/2023 0905   Sepsis Labs: Invalid input(s): "PROCALCITONIN", "LACTICIDVEN"  Microbiology: Recent Results (from the past 240 hours)  Urine Culture     Status: Abnormal   Collection Time: 10/10/23  6:43 PM   Specimen: In/Out Cath Urine  Result Value Ref Range Status   Specimen Description   Final    IN/OUT CATH URINE Performed at [facility], [address] [place], [place] [address]    Special Requests   Final    NONE Performed at [facility], [address], [place], [place] [address]    Culture (A)  Final    >=100,000 COLONIES/mL AEROCOCCUS SANGUINICOLA Standardized susceptibility testing for this organism is not available. Performed at [facility], [address], [place], [place] [address]    Report Status 10/12/2023 FINAL  Final    Radiology Studies: Overnight EEG with video Result Date: 10/14/2023 Charlsie Quest, MD     10/14/2023  7:02 AM Patient Name: CAROLLYN ETCHEVERRY MRN: [id number] Epilepsy Attending: Charlsie Quest Referring Physician/Provider: Erick Blinks, MD Duration: 10/13/2023 1534 to 10/14/2023 0700 Patient history: 78 y.o. female who was brought in after multiple seizures. EEG to evaluate for seizure Level of alertness: Awake, asleep AEDs during EEG study: LEV, Clonazepam Technical aspects: This EEG study was done with scalp electrodes positioned according to the 10-20 International system of electrode placement. Electrical activity was reviewed with band pass filter of 1-70Hz , sensitivity of 7 uV/mm, display speed of 50mm/sec with a 60Hz  notched filter applied as appropriate. EEG data were recorded continuously and digitally stored.  Video monitoring was available and reviewed as appropriate. Description: The posterior dominant rhythm consists of 8-9 Hz activity of moderate voltage (25-35 uV) seen predominantly in posterior head regions, symmetric and reactive to eye opening and eye closing.  IV Ativan was administered on 08/17/2023 at around 2116.  Subsequently patient fell asleep.  Sleep was characterized by vertex waves, sleep symptoms (12 to 14 hours), maximal frontocentral region).  EEG also showed continuous generalized 3-5 Hz theta-delta slowing admixed within excessive amount of sharply contoured 15 to 18 Hz beta activity with irregular morphology distributed symmetrically and diffusely. Hyperventilation and photic stimulation were not performed.    ABNORMALITY - Continuous slow, generalized - Excessive beta, generalized  IMPRESSION: This study is suggestive of mild to moderate diffuse encephalopathy. The excessive beta activity seen in the background is most likely due to the effect of benzodiazepine use.   No seizures or epileptiform discharges were seen during the study. Charlsie Quest    CT Head Wo Contrast Result Date: 10/13/2023 CLINICAL DATA:  Mental status change, unknown cause. EXAM: CT HEAD WITHOUT CONTRAST TECHNIQUE: Contiguous axial images were obtained from the base of the skull  through the vertex without intravenous contrast. RADIATION DOSE REDUCTION: This exam was performed according to the departmental dose-optimization program which includes automated exposure control, adjustment of the mA and/or kV according to patient size and/or use of iterative reconstruction technique. COMPARISON:  Head MRI 08/18/2023 and CT 08/17/2023 FINDINGS: Brain: There is no evidence of an acute infarct, intracranial hemorrhage, mass, midline shift, or extra-axial fluid collection. Patchy to confluent hypodensities in the cerebral white matter have likely not significantly changed from the prior CT allowing for differences in technique and are nonspecific but compatible with moderate chronic small vessel ischemic disease. Moderately age advanced cerebral  atrophy is again noted. Vascular: Calcified atherosclerosis at the skull base. No hyperdense vessel. Skull: No acute fracture or suspicious lesion. Sinuses/Orbits: Mild mucosal thickening in the paranasal sinuses. Clear mastoid air cells. Bilateral cataract extraction. Other: None. IMPRESSION: 1. No evidence of acute intracranial abnormality. 2. Moderate chronic small vessel ischemic disease and cerebral atrophy. Electronically Signed   By: Sebastian Ache M.D.   On: 10/13/2023 14:54      Emilija Bohman T. Darleen Moffitt [facility]  If 7PM-7AM, please contact night-coverage [email or web address] 10/14/2023, 11:24 AM

## 2023-10-14 NOTE — ED Notes (Signed)
 Complete linen and gown change performed at this time. Pt placed in a clean brief and a warm blanket was provided.

## 2023-10-14 NOTE — ED Notes (Signed)
 Breakfast tray at bedside; pt declining food at this time

## 2023-10-14 NOTE — ED Notes (Signed)
 Daughter would like pt to receive chocolate ensure to help with calorie intake.

## 2023-10-14 NOTE — ED Notes (Signed)
Dr. Linzen at bedside. 

## 2023-10-14 NOTE — TOC Initial Note (Signed)
 Transition of Care [facility]) - Initial/Assessment Note    Patient Details  Name: [name] MRN: [id number] Date of Birth: 1946/01/06  Transition of Care [facility]) CM/SW Contact:    Lamonte Sakai, Student-Social Work Phone Number: 10/14/2023, 3:32 PM  Clinical Narrative:                  Pt admitted from home with seizures. No current TOC needs, please consult as needs arise following therapy eval.       Patient Goals and CMS Choice            Expected Discharge Plan and Services       Living arrangements for the past 2 months: Single Family Home                                      Prior Living Arrangements/Services Living arrangements for the past 2 months: Single Family Home                     Activities of Daily Living   ADL Screening (condition at time of admission) Independently performs ADLs?: No Does the patient have a NEW difficulty with bathing/dressing/toileting/self-feeding that is expected to last >3 days?: No (NEEDS ASSIST) Does the patient have a NEW difficulty with getting in/out of bed, walking, or climbing stairs that is expected to last >3 days?: No (NEEDS ASSIST UP TO BEDSIDE COMMODE) Does the patient have a NEW difficulty with communication that is expected to last >3 days?: No Is the patient deaf or have difficulty hearing?: Yes Does the patient have difficulty seeing, even when wearing glasses/contacts?: No Does the patient have difficulty concentrating, remembering, or making decisions?: Yes  Permission Sought/Granted                  Emotional Assessment       Orientation: : Oriented to Self      Admission diagnosis:  Seizure (HCC) [R56.9] Seizures (HCC) [R56.9] Recurrent seizures (HCC) [G40.909] Patient Active Problem List   Diagnosis Date Noted   Recurrent seizures (HCC) 10/14/2023   Seizure (HCC) 10/13/2023   Drowsiness 08/24/2023   Seizures (HCC) 08/17/2023   REM sleep behavior disorder    Generalized  anxiety disorder    Acute metabolic encephalopathy 07/19/2023   Hypokalemia 07/19/2023   Constipation 07/19/2023   Parkinson's disease    Bilateral sensorineural hearing loss 05/03/2023   Foul smelling urine 02/08/2023   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   Osteoporosis 01/31/2017   GERD (gastroesophageal reflux disease) 08/26/2015   Type 2 diabetes mellitus with hyperglycemia 12/03/2014   DNR (do not resuscitate) 11/23/2012   Dysphagia 12/07/2011   Orthostasis 12/07/2011   Bell's palsy    Syncope 12/06/2011   Agnosia 04/07/2011   Hypersomnia 01/18/2011   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 [place] [address] Phone: [phone] Fax: [phone]  [facility] [address] [place] [address] Phone: [phone] Fax: [phone]     Social Drivers of Health (SDOH)  Social History: SDOH Screenings   Food Insecurity: Patient Unable To Answer (10/13/2023)  Housing: Patient Unable To Answer (10/13/2023)  Transportation Needs: Patient Unable To Answer (10/13/2023)  Utilities: Patient Unable To Answer (10/13/2023)  Alcohol Screen: Low Risk  (06/10/2023)  Depression (PHQ2-9): Low Risk  (06/10/2023)  Recent Concern: Depression (PHQ2-9) - High Risk (05/31/2023)  Financial Resource Strain: Low Risk  (06/10/2023)  Physical Activity: Inactive (06/10/2023)  Social Connections: Unknown (10/13/2023)  Recent Concern: Social Connections - Socially Isolated (08/18/2023)  Stress: No Stress Concern Present (06/10/2023)  Tobacco Use: Low Risk  (10/13/2023)   Health Literacy: Inadequate Health Literacy (06/10/2023)   SDOH Interventions:     Readmission Risk Interventions    07/01/2023    3:51 PM  Readmission Risk Prevention Plan  Post Dischage Appt Complete  Medication Screening Complete  Transportation Screening Complete

## 2023-10-14 NOTE — ED Notes (Signed)
Dr. Cyndia Skeeters at bedside.

## 2023-10-14 NOTE — Progress Notes (Signed)
 NEUROLOGY CONSULT FOLLOW UP NOTE   Date of service: October 14, 2023 Patient Name: [name] MRN:  [id number] DOB:  07/17/1946  Brief review of HPI: [name] is a 78 y.o. female with hx of Bell's palsy in 1985, bilateral sensorineural hearing loss, DVT in the early 2000's, dysmetabolic syndrome X, dysphagia, HTN, GAD, GERD, hypersomnia, hypokalemia, mild neurocognitive disorder, HLD, orthostasis, Parkinson's disease, vascular Parkinsonism, osteoporosis, recurrent UTI, REM sleep behavior disorder, syncope and DM2 who was brought in after multiple seizures. Patient's daughter was at bedside at the time of admission on Thursday 3/20, reporting that patient had seizures a decade ago when she was on Wellbutrin.  She was started on an antiepileptic and Wellbutrin was weaned off and she was later able to come off of AEDs. Daughter reported that a couple of months ago, patient had been having episodes concerning for seizures again.  Initially the episodes were happening about once every couple weeks but over the last week or so, she has been having episodes 3-4 times a day.  She was evaluated by Dr. Lurena Joiner Tat on a home video visit and was directed to the ED. Daughter reports that about 5 days ago, patient was diagnosed with a UTI and was started on ciprofloxacin.  Daughter reports that her Keppra was increased from 500 mg twice daily to 750 mg twice daily starting last Thursday. During her typical seizure episodes, her arms are up in the air and contracted, her face turns to the right.  Daughter is not sure if she has any gaze deviation or if her eyes are open.  She is gurgling during the episode.  She has some saliva at her mouth after the episode.  The episode can last up to 5 minutes.  She is snoring after the episode and sleeps for several hours. Daughter reported the patient had about 2-3 episodes on the day of presentation (Thursday).  She then had 1 additional witnessed event in the ED. No  history of alcohol use, denies any significant head injury with loss of consciousness.  Daughter denies any history of brain infection, brain surgery, stroke, ICH, brain tumors.  Patient does not drink alcohol, and she does not have a prior history of alcohol use.  Patient does not use any recreational substances. Of note, patient was admitted about 2 months ago with seizure-like episodes and was put on continuous EEG for 2 days and no spells were captured.  Patient was discharged on Keppra 250 twice daily.  She also had an MRI of the brain during that admission which was negative for any acute intracranial abnormalities.  There was no definitive seizure focus that was identified.  She was noted to have advanced renal atrophy for age.  Interval Hx/subjective  Lying in bed, awake, with EEG leads in place.   Vitals   Vitals:   10/14/23 1136 10/14/23 1200 10/14/23 1230 10/14/23 1358  BP: (!) 146/73  90/72 135/60  Pulse:  80 81   Resp:  14 15   Temp:    98.5 F (36.9 C)  TempSrc:    Oral  SpO2:  99% 98%      There is no height or weight on file to calculate BMI.  Physical Exam   HEENT: EEG leads in place.  Ext: No edema  Neurologic Examination   Mental status/Cognition: Awake with eyes open when examiner enters room. Only able to state her name in response to orientation questions, other than giving "12" when asked for  the day of the week. Does not answer any other questions. Visually tracks examiner during the assessment and will make eye contact. She continues to have very poor attention. Speech/language: Dysarthric, sparse speech without any intelligible output other than her name and "12" as above. She continues to only be able to follow a few one-step commands. She gave no answers when asked to name objects.    Cranial nerves:   CN II Pupils equal and reactive to light, .   CN III,IV,VI EOM intact, no gaze preference or deviation, no nystagmus   CN V Grossly intact   CN VII Mild  left facial droop that also involves the upper face consistent with provided history of Bell's palsy.   CN VIII Reacts to voice    CN IX & X Gag reflex deferred   CN XI Head is midline, she is spontaneously turning her head left and right.   CN XII She does not protrude tongue on command.  Sensory/motor:  Muscle bulk: Poor, tone normal Unable to do detailed strength testing secondary to encephalopathy.  She is moving bilateral upper extremities spontaneously and equally and antigravity.  She has 4 out of 5 grip strength bilaterally. She has antigravity movement in bilateral lower extremities noted on Babinski's. Reflexes: Unremarkable. Toes downgoing bilaterally  Medications  Current Facility-Administered Medications:    acetaminophen (TYLENOL) tablet 650 mg, 650 mg, Oral, Q6H PRN **OR** acetaminophen (TYLENOL) suppository 650 mg, 650 mg, Rectal, Q6H PRN, Lajuana Ripple, Neelima, MD   acetaminophen (TYLENOL) tablet 500 mg, 500 mg, Oral, Q6H PRN, Kamineni, Neelima, MD   albuterol (PROVENTIL) (2.5 MG/3ML) 0.083% nebulizer solution 2.5 mg, 2.5 mg, Nebulization, Q2H PRN, Lajuana Ripple, Neelima, MD   amLODipine (NORVASC) tablet 10 mg, 10 mg, Oral, Daily, Alanda Slim, Taye T, MD, 10 mg at 10/14/23 1136   aspirin EC tablet 81 mg, 81 mg, Oral, Daily, Kamineni, Neelima, MD, 81 mg at 10/14/23 1018   calcium carbonate (TUMS - dosed in mg elemental calcium) chewable tablet 200 mg of elemental calcium, 1 tablet, Oral, Daily PRN, Alessandra Bevels, MD   carbidopa-levodopa (SINEMET IR) 25-100 MG per tablet immediate release 3 tablet, 3 tablet, Oral, TID, Alessandra Bevels, MD, 3 tablet at 10/14/23 1019   cholecalciferol (VITAMIN D3) 25 MCG (1000 UNIT) tablet 1,000 Units, 1,000 Units, Oral, Daily, Alessandra Bevels, MD, 1,000 Units at 10/14/23 1018   clonazePAM (KLONOPIN) tablet 0.5 mg, 0.5 mg, Oral, QHS, Kamineni, Neelima, MD, 0.5 mg at 10/13/23 2223   donepezil (ARICEPT) tablet 5 mg, 5 mg, Oral, QHS, Kamineni, Neelima, MD,  5 mg at 10/13/23 2222   enoxaparin (LOVENOX) injection 40 mg, 40 mg, Subcutaneous, Q24H, Kamineni, Neelima, MD, 40 mg at 10/14/23 1019   escitalopram (LEXAPRO) tablet 10 mg, 10 mg, Oral, Daily, Kamineni, Neelima, MD, 10 mg at 10/14/23 1019   feeding supplement (ENSURE ENLIVE / ENSURE PLUS) liquid 237 mL, 237 mL, Oral, BID BM, Gonfa, Taye T, MD, 237 mL at 10/14/23 1452   hydrALAZINE (APRESOLINE) tablet 25 mg, 25 mg, Oral, Q6H PRN, Alanda Slim, Taye T, MD   levETIRAcetam (KEPPRA) tablet 750 mg, 750 mg, Oral, BID, Kamineni, Neelima, MD, 750 mg at 10/14/23 1019   LORazepam (ATIVAN) injection 2 mg, 2 mg, Intravenous, Once PRN, Alanda Slim, Taye T, MD   memantine (NAMENDA) tablet 10 mg, 10 mg, Oral, BID, Kamineni, Neelima, MD, 10 mg at 10/14/23 1018   mirtazapine (REMERON) tablet 15 mg, 15 mg, Oral, QHS, Kamineni, Neelima, MD, 15 mg at 10/13/23 2223   montelukast (SINGULAIR) tablet  10 mg, 10 mg, Oral, QHS, Kamineni, Neelima, MD, 10 mg at 10/13/23 2222   ondansetron (ZOFRAN) tablet 4 mg, 4 mg, Oral, Q6H PRN **OR** ondansetron (ZOFRAN) injection 4 mg, 4 mg, Intravenous, Q6H PRN, Alessandra Bevels, MD   rosuvastatin (CRESTOR) tablet 5 mg, 5 mg, Oral, QPM, Alessandra Bevels, MD, 5 mg at 10/13/23 1712  Labs and Diagnostic Imaging   CBC:  Recent Labs  Lab 10/10/23 1621 10/13/23 1319  WBC 9.6 9.3  NEUTROABS 7.9*  --   HGB 11.9* 10.8*  HCT 37.3 32.9*  MCV 90.3 87.0  PLT 187 212    Basic Metabolic Panel:  Lab Results  Component Value Date   NA 135 10/14/2023   K 3.8 10/14/2023   CO2 22 10/14/2023   GLUCOSE 111 (H) 10/14/2023   BUN 9 10/14/2023   CREATININE 0.73 10/14/2023   CALCIUM 8.9 10/14/2023   GFRNONAA >60 10/14/2023   GFRAA >60 06/13/2016   Lipid Panel:  Lab Results  Component Value Date   LDLCALC 34 04/26/2023   HgbA1c:  Lab Results  Component Value Date   HGBA1C 7.2 (H) 04/26/2023   Urine Drug Screen:     Component Value Date/Time   LABOPIA NONE DETECTED 10/14/2023 0928    COCAINSCRNUR NONE DETECTED 10/14/2023 0928   LABBENZ POSITIVE (A) 10/14/2023 0928   AMPHETMU NONE DETECTED 10/14/2023 0928   THCU NONE DETECTED 10/14/2023 0928   LABBARB NONE DETECTED 10/14/2023 0928    Alcohol Level     Component Value Date/Time   ETH <10 08/08/2023 1045   INR  Lab Results  Component Value Date   INR 1.1 08/18/2023   APTT  Lab Results  Component Value Date   APTT 28 08/18/2023     Assessment  78 y.o. female with hx of epilepsy (follows with Dr. Arbutus Leas), Bell's palsy in 1985, bilateral sensorineural hearing loss, DVT in the early 2000's, dysmetabolic syndrome X, dysphagia, HTN, GAD, GERD, hypersomnia, hypokalemia, mild neurocognitive disorder, HLD, orthostasis, Parkinson's disease, vascular Parkinsonism, osteoporosis, recurrent UTI, REM sleep behavior disorder, syncope and DM2 who was brought in on Thursday after multiple seizures at home. [facility] noticed a brief second seizure, then third seizure and got 5 midazolam IM. Daughter had captured part of her event on her video and review of the captured video by Neurohospitalist on initial assessment was most suggiestive of ann epileptic seizure. Family denied any fevers, chest pain, shortness of breath, nausea, vomiting, constipation, diarrhea, or urinary symptoms.  They have been giving her her baseline dose of Keppra which she has been on for many months.  She has also been getting her nighttime half tablet of Klonopin every night. Per Dr. Don Perking outpatient note on the day of admission (telephone consult), since January, the patient has now had multiple episodes of seizure-like activity. She has had video EEG monitoring that was just with background slowing. Seizure semiology has not all been the same per Dr. Arbutus Leas, and she felt that it was important to define the events with LTM EEG, preferably in the EMU. She was loaded with Keppra 3000 mg IV in the ED.  - Today's exam is essentially unchanged since yesterday. Severe cognitive  impairment is noted in the context of her known dementia.  - CT head: . No evidence of acute intracranial abnormality. Moderate chronic small vessel ischemic disease and cerebral atrophy. - Recent MRI brain from January of this year: No acute intracranial process. No definite seizure focus is identified. Advanced cerebral atrophy for age. the hippocampi  are roughly symmetric in size and signal, with possible atrophy bilaterally but no definite abnormally increased T2 hyperintense signal. No heterotopia or evidence of cortical dysgenesis. - LTM EEG report for today:  Continuous slow, generalized; Excessive beta, generalized. This study is suggestive of mild to moderate diffuse encephalopathy. The excessive beta activity seen in the background is most likely due to the effect of benzodiazepine use.   No seizures or epileptiform discharges were seen during the study. - Overall impression: Recurrent seizures, one of which was captured by daughter on video and appears on review to be most consistent with an epileptic seizures. Given the increased frequency of her seizures recently, she most likely needs to have her Keppra up-titrated. Alternatively, her seizure threshold could have been further lowered by her recent UTI.    Recommendations  - Increasing Keppra to 1000 mg BID.  - Continue LTM EEG for another 24 hours in attempt to capture a seizure to further define its characteristics.  - Hold off on MRI brain for now she had an MRI on 08/18/2023 and this was done after she had several seizures. -Seizure precautions with seizure pads. ______________________________________________________________________   Dessa Phi, Rashana Andrew, MD [facility]

## 2023-10-14 NOTE — ED Notes (Signed)
 RN updated daughter on next steps. Daughter is assisting giving pt medication.

## 2023-10-14 NOTE — Progress Notes (Signed)
 LTM maint complete - no skin breakdown seen  Serviced F4, placed stocking cap -patient is awake and moving about in bed Not monitored by [facility] due to being in ER

## 2023-10-15 ENCOUNTER — Inpatient Hospital Stay (HOSPITAL_COMMUNITY)

## 2023-10-15 DIAGNOSIS — F0390 Unspecified dementia without behavioral disturbance: Secondary | ICD-10-CM | POA: Diagnosis not present

## 2023-10-15 DIAGNOSIS — R569 Unspecified convulsions: Secondary | ICD-10-CM | POA: Diagnosis not present

## 2023-10-15 LAB — CBC
HCT: 32.6 % — ABNORMAL LOW (ref 36.0–46.0)
Hemoglobin: 10.6 g/dL — ABNORMAL LOW (ref 12.0–15.0)
MCH: 28.3 pg (ref 26.0–34.0)
MCHC: 32.5 g/dL (ref 30.0–36.0)
MCV: 86.9 fL (ref 80.0–100.0)
Platelets: 209 10*3/uL (ref 150–400)
RBC: 3.75 MIL/uL — ABNORMAL LOW (ref 3.87–5.11)
RDW: 15.6 % — ABNORMAL HIGH (ref 11.5–15.5)
WBC: 12.2 10*3/uL — ABNORMAL HIGH (ref 4.0–10.5)
nRBC: 0.2 % (ref 0.0–0.2)

## 2023-10-15 LAB — RENAL FUNCTION PANEL
Albumin: 3.3 g/dL — ABNORMAL LOW (ref 3.5–5.0)
Anion gap: 12 (ref 5–15)
BUN: 14 mg/dL (ref 8–23)
CO2: 27 mmol/L (ref 22–32)
Calcium: 9.3 mg/dL (ref 8.9–10.3)
Chloride: 100 mmol/L (ref 98–111)
Creatinine, Ser: 0.58 mg/dL (ref 0.44–1.00)
GFR, Estimated: >60 mL/min (ref >60)
Glucose, Bld: 96 mg/dL (ref 70–99)
Phosphorus: 2.1 mg/dL — ABNORMAL LOW (ref 2.5–4.6)
Potassium: 3.4 mmol/L — ABNORMAL LOW (ref 3.5–5.1)
Sodium: 139 mmol/L (ref 135–145)

## 2023-10-15 LAB — MAGNESIUM: Magnesium: 1.8 mg/dL (ref 1.7–2.4)

## 2023-10-15 MED ORDER — POTASSIUM PHOSPHATES 15 MMOLE/5ML IV SOLN
30.0000 mmol | Freq: Once | INTRAVENOUS | Status: AC
  Administered 2023-10-15: 30 mmol via INTRAVENOUS
  Filled 2023-10-15: qty 10

## 2023-10-15 MED ORDER — POTASSIUM CHLORIDE CRYS ER 20 MEQ PO TBCR: 40.0000 meq | EXTENDED_RELEASE_TABLET | Freq: Four times a day (QID) | ORAL | Status: DC

## 2023-10-15 MED ORDER — POTASSIUM CHLORIDE CRYS ER 20 MEQ PO TBCR
30.0000 meq | EXTENDED_RELEASE_TABLET | Freq: Four times a day (QID) | ORAL | Status: AC
  Administered 2023-10-15 (×2): 30 meq via ORAL
  Filled 2023-10-15 (×2): qty 1

## 2023-10-15 NOTE — Progress Notes (Signed)
 NEUROLOGY CONSULT FOLLOW UP NOTE   Date of service: October 15, 2023 Patient Name: [name] MRN:  [id number] DOB:  1946-02-21  Brief review of HPI: [name] is a 78 y.o. female with hx of Bell's palsy in 1985, bilateral sensorineural hearing loss, DVT in the early 2000's, dysmetabolic syndrome X, dysphagia, HTN, GAD, GERD, hypersomnia, hypokalemia, mild neurocognitive disorder, HLD, orthostasis, Parkinson's disease, vascular Parkinsonism, osteoporosis, recurrent UTI, REM sleep behavior disorder, syncope and DM2 who was brought in after multiple seizures. Patient's daughter was at bedside at the time of admission on Thursday 3/20, reporting that patient had seizures a decade ago when she was on Wellbutrin.  She was started on an antiepileptic and Wellbutrin was weaned off and she was later able to come off of AEDs. Daughter reported that a couple of months ago, patient had been having episodes concerning for seizures again.  Initially the episodes were happening about once every couple weeks but over the last week or so, she has been having episodes 3-4 times a day.  She was evaluated by Dr. Lurena Joiner Tat on a home video visit and was directed to the ED. Daughter reports that about 5 days ago, patient was diagnosed with a UTI and was started on ciprofloxacin.  Daughter reports that her Keppra was increased from 500 mg twice daily to 750 mg twice daily starting last Thursday. During her typical seizure episodes, her arms are up in the air and contracted, her face turns to the right.  Daughter is not sure if she has any gaze deviation or if her eyes are open.  She is gurgling during the episode.  She has some saliva at her mouth after the episode.  The episode can last up to 5 minutes.  She is snoring after the episode and sleeps for several hours. Daughter reported the patient had about 2-3 episodes on the day of presentation (Thursday).  She then had 1 additional witnessed event in the ED. No  history of alcohol use, denies any significant head injury with loss of consciousness.  Daughter denies any history of brain infection, brain surgery, stroke, ICH, brain tumors.  Patient does not drink alcohol, and she does not have a prior history of alcohol use.  Patient does not use any recreational substances. Of note, patient was admitted about 2 months ago with seizure-like episodes and was put on continuous EEG for 2 days and no spells were captured.  Patient was discharged on Keppra 250 twice daily.  She also had an MRI of the brain during that admission which was negative for any acute intracranial abnormalities.  There was no definitive seizure focus that was identified.  She was noted to have advanced renal atrophy for age.  Interval Hx/subjective  Lying in bed, awake, with EEG leads in place.   Vitals   Vitals:   10/14/23 2000 10/14/23 2340 10/15/23 0000 10/15/23 0400  BP: 93/77 114/61 (!) 110/58 (!) 134/57  Pulse: 92 81 88 81  Resp: 19 16 16 17   Temp: 98.2 F (36.8 C) 98 F (36.7 C) 98.3 F (36.8 C) 98.1 F (36.7 C)  TempSrc: Oral Oral Oral Oral  SpO2: 95% 92% 92% 94%     There is no height or weight on file to calculate BMI.  Physical Exam   HEENT: EEG leads in place.  Ext: No edema  Neurologic Examination   Mental status/Cognition: Awake with eyes open when examiner enters room. Only able to state her name in response  to orientation questions, when asked age she is unable to answer. Does not answer any other questions. Visually tracks examiner during the assessment and will make eye contact. She continues to have very poor attention. Says "mhmm, okay" when I tell her she is doing better Speech/language: Dysarthric, sparse speech, states her name. She continues to only be able to follow a few one-step commands. She gave no answers when asked to name objects.    Cranial nerves:   CN II Pupils equal and reactive to light, .   CN III,IV,VI EOM intact, no gaze preference  or deviation, no nystagmus   CN V Grossly intact   CN VII Mild left facial droop that also involves the upper face consistent with provided history of Bell's palsy.   CN VIII Reacts to voice    CN IX & X Gag reflex deferred   CN XI Head is midline, she is spontaneously turning her head left and right.   CN XII She does not protrude tongue on command.  Sensory/motor:  Muscle bulk: Poor, tone normal Unable to do detailed strength testing secondary to encephalopathy.  She is moving bilateral upper extremities spontaneously and equally and antigravity.  She has 4 out of 5 grip strength bilaterally. She has antigravity movement in bilateral lower extremities noted on Babinski's. Reflexes: Unremarkable. Toes downgoing bilaterally  Medications  Current Facility-Administered Medications:    acetaminophen (TYLENOL) tablet 650 mg, 650 mg, Oral, Q6H PRN **OR** acetaminophen (TYLENOL) suppository 650 mg, 650 mg, Rectal, Q6H PRN, Lajuana Ripple, Neelima, MD   acetaminophen (TYLENOL) tablet 500 mg, 500 mg, Oral, Q6H PRN, Kamineni, Neelima, MD   albuterol (PROVENTIL) (2.5 MG/3ML) 0.083% nebulizer solution 2.5 mg, 2.5 mg, Nebulization, Q2H PRN, Lajuana Ripple, Neelima, MD   amLODipine (NORVASC) tablet 10 mg, 10 mg, Oral, Daily, Alanda Slim, Taye T, MD, 10 mg at 10/14/23 1136   aspirin EC tablet 81 mg, 81 mg, Oral, Daily, Kamineni, Neelima, MD, 81 mg at 10/14/23 1018   calcium carbonate (TUMS - dosed in mg elemental calcium) chewable tablet 200 mg of elemental calcium, 1 tablet, Oral, Daily PRN, Lajuana Ripple, Neelima, MD   carbidopa-levodopa (SINEMET IR) 25-100 MG per tablet immediate release 3 tablet, 3 tablet, Oral, TID, Alessandra Bevels, MD, 3 tablet at 10/14/23 2117   cholecalciferol (VITAMIN D3) 25 MCG (1000 UNIT) tablet 1,000 Units, 1,000 Units, Oral, Daily, Kamineni, Neelima, MD, 1,000 Units at 10/14/23 1018   clonazePAM (KLONOPIN) tablet 0.5 mg, 0.5 mg, Oral, QHS, Kamineni, Neelima, MD, 0.5 mg at 10/14/23 2116    donepezil (ARICEPT) tablet 5 mg, 5 mg, Oral, QHS, Kamineni, Neelima, MD, 5 mg at 10/14/23 2117   enoxaparin (LOVENOX) injection 40 mg, 40 mg, Subcutaneous, Q24H, Kamineni, Neelima, MD, 40 mg at 10/14/23 1019   escitalopram (LEXAPRO) tablet 10 mg, 10 mg, Oral, Daily, Kamineni, Neelima, MD, 10 mg at 10/14/23 1019   feeding supplement (ENSURE ENLIVE / ENSURE PLUS) liquid 237 mL, 237 mL, Oral, BID BM, Gonfa, Taye T, MD, 237 mL at 10/14/23 1452   hydrALAZINE (APRESOLINE) tablet 25 mg, 25 mg, Oral, Q6H PRN, Alanda Slim, Taye T, MD   levETIRAcetam (KEPPRA) tablet 1,000 mg, 1,000 mg, Oral, BID, Caryl Pina, MD, 1,000 mg at 10/14/23 2117   LORazepam (ATIVAN) injection 2 mg, 2 mg, Intravenous, Once PRN, Alanda Slim, Taye T, MD   memantine (NAMENDA) tablet 10 mg, 10 mg, Oral, BID, Kamineni, Neelima, MD, 10 mg at 10/14/23 2117   mirtazapine (REMERON) tablet 15 mg, 15 mg, Oral, QHS, Kamineni, Neelima, MD, 15 mg  at 10/14/23 2116   montelukast (SINGULAIR) tablet 10 mg, 10 mg, Oral, QHS, Kamineni, Neelima, MD, 10 mg at 10/14/23 2116   ondansetron (ZOFRAN) tablet 4 mg, 4 mg, Oral, Q6H PRN **OR** ondansetron (ZOFRAN) injection 4 mg, 4 mg, Intravenous, Q6H PRN, Alessandra Bevels, MD   rosuvastatin (CRESTOR) tablet 5 mg, 5 mg, Oral, QPM, Alessandra Bevels, MD, 5 mg at 10/14/23 1719  Labs and Diagnostic Imaging   CBC:  Recent Labs  Lab 10/10/23 1621 10/13/23 1319 10/15/23 0514  WBC 9.6 9.3 12.2*  NEUTROABS 7.9*  --   --   HGB 11.9* 10.8* 10.6*  HCT 37.3 32.9* 32.6*  MCV 90.3 87.0 86.9  PLT 187 212 209    Basic Metabolic Panel:  Lab Results  Component Value Date   NA 139 10/15/2023   K 3.4 (L) 10/15/2023   CO2 27 10/15/2023   GLUCOSE 96 10/15/2023   BUN 14 10/15/2023   CREATININE 0.58 10/15/2023   CALCIUM 9.3 10/15/2023   GFRNONAA >60 10/15/2023   GFRAA >60 06/13/2016   Lipid Panel:  Lab Results  Component Value Date   LDLCALC 34 04/26/2023   HgbA1c:  Lab Results  Component Value Date   HGBA1C 7.2  (H) 04/26/2023   Urine Drug Screen:     Component Value Date/Time   LABOPIA NONE DETECTED 10/14/2023 0928   COCAINSCRNUR NONE DETECTED 10/14/2023 0928   LABBENZ POSITIVE (A) 10/14/2023 0928   AMPHETMU NONE DETECTED 10/14/2023 0928   THCU NONE DETECTED 10/14/2023 0928   LABBARB NONE DETECTED 10/14/2023 0928    Alcohol Level     Component Value Date/Time   ETH <10 08/08/2023 1045   INR  Lab Results  Component Value Date   INR 1.1 08/18/2023   APTT  Lab Results  Component Value Date   APTT 28 08/18/2023     Assessment  78 y.o. female with hx of epilepsy (follows with Dr. Arbutus Leas), Bell's palsy in 1985, bilateral sensorineural hearing loss, DVT in the early 2000's, dysmetabolic syndrome X, dysphagia, HTN, GAD, GERD, hypersomnia, hypokalemia, mild neurocognitive disorder, HLD, orthostasis, Parkinson's disease, vascular Parkinsonism, osteoporosis, recurrent UTI, REM sleep behavior disorder, syncope and DM2 who was brought in on Thursday after multiple seizures at home. [facility] noticed a brief second seizure, then third seizure and got 5 midazolam IM. Daughter had captured part of her event on her video and review of the captured video by Neurohospitalist on initial assessment was most suggiestive of ann epileptic seizure. Family denied any fevers, chest pain, shortness of breath, nausea, vomiting, constipation, diarrhea, or urinary symptoms.  They have been giving her her baseline dose of Keppra which she has been on for many months.  She has also been getting her nighttime half tablet of Klonopin every night. Per Dr. Don Perking outpatient note on the day of admission (telephone consult), since January, the patient has now had multiple episodes of seizure-like activity. She has had video EEG monitoring that was just with background slowing. Seizure semiology has not all been the same per Dr. Arbutus Leas, and she felt that it was important to define the events with LTM EEG, preferably in the EMU. She was loaded  with Keppra 3000 mg IV in the ED.  - Today's exam is essentially unchanged since yesterday. Severe cognitive impairment is noted in the context of her known dementia.  - CT head: . No evidence of acute intracranial abnormality. Moderate chronic small vessel ischemic disease and cerebral atrophy. - Recent MRI brain from January of this  year: No acute intracranial process. No definite seizure focus is identified. Advanced cerebral atrophy for age. the hippocampi are roughly symmetric in size and signal, with possible atrophy bilaterally but no definite abnormally increased T2 hyperintense signal. No heterotopia or evidence of cortical dysgenesis. - LTM EEG report for 3/21-3/22:  Continuous slow, generalized; Excessive beta, generalized. This study is suggestive of mild to moderate diffuse encephalopathy. The excessive beta activity seen in the background is most likely due to the effect of benzodiazepine use.   No seizures or epileptiform discharges were seen during the study. - Overall impression: Recurrent seizures, one of which was captured by daughter on video and appears on review to be most consistent with an epileptic seizures. Given the increased frequency of her seizures recently we have increased keppra to 1000 mg BID. Alternatively, her seizure threshold could have been further lowered by her recent UTI.    Recommendations  - Continue Keppra at increased dose of 1000 mg BID.  - Discontinue LTM - Hold off on MRI brain for now she had an MRI on 08/18/2023 and this was done after she had several seizures. - Seizure precautions with seizure pads. - Neurohospitalist service will sign off. Please call if there are additional questions.  ______________________________________________________________________  Electronically signed: Dr. Caryl Pina

## 2023-10-15 NOTE — Procedures (Signed)
 Patient Name: [name]  MRN: [id number]  Epilepsy Attending: Charlsie Quest  Referring Physician/Provider: Erick Blinks, MD  Duration: 10/14/2023 1600 to 10/15/2023 1440   Patient history: 78 y.o. female who was brought in after multiple seizures. EEG to evaluate for seizure   Level of alertness: Awake, asleep   AEDs during EEG study: LEV, Clonazepam   Technical aspects: This EEG study was done with scalp electrodes positioned according to the 10-20 International system of electrode placement. Electrical activity was reviewed with band pass filter of 1-70Hz , sensitivity of 7 uV/mm, display speed of 68mm/sec with a 60Hz  notched filter applied as appropriate. EEG data were recorded continuously and digitally stored.  Video monitoring was available and reviewed as appropriate.   Description: The posterior dominant rhythm consists of 8-9 Hz activity of moderate voltage (25-35 uV) seen predominantly in posterior head regions, symmetric and reactive to eye opening and eye closing.  IV Ativan was administered on 08/17/2023 at around 2116.  Subsequently patient fell asleep. Sleep was characterized by vertex waves, sleep symptoms (12 to 14 hours), maximal frontocentral region). EEG also showed continuous generalized 3-5 Hz theta-delta slowing admixed within excessive amount of sharply contoured 15 to 18 Hz beta activity with irregular morphology distributed symmetrically and diffusely.  Hyperventilation and photic stimulation were not performed.     One seizure was noted on 10/15/2023 at 1127. Patient was laying in bed and had trembling in hands per family at bedside. On video patient initially had no clinical signs. She then had upward gaze followed by head turn to right and appeared to slowly close her eyes. Once seizure ended, she appeared to open her eyes and look around. EEG showed generalized and maximal vertex region 5-6Hz  theta-delta slowing admixed with sharply contoured 13-15hz  beta  activity which evolved into 2-3Hz  delta slowing admixed with sharply contoured 13-15hz  beta activity. Duration of seizure was 7 minutes.   ABNORMALITY - Seizure, generalized - Continuous slow, generalized - Excessive beta, generalized   IMPRESSION: This study showed one seizure on 10/15/2022 at 1127 as described above. The onset was poorly localized and appeared generalized and maximal in vertex region. Duration of seizure was 7 minutes.    Additionally there is mild to moderate diffuse encephalopathy. The excessive beta activity seen in the background is most likely due to the effect of benzodiazepine use.      Frutoso Dimare Annabelle Harman

## 2023-10-15 NOTE — Progress Notes (Signed)
 Please be advised that the above-named patient will require a short-term nursing home stay-anticipated 30 days or less for rehabilitation and strengthening. The plan is for return home.

## 2023-10-15 NOTE — Plan of Care (Signed)

## 2023-10-15 NOTE — Progress Notes (Signed)
 ee

## 2023-10-15 NOTE — TOC Progression Note (Addendum)
 Transition of Care [facility]) - Progression Note    Patient Details  Name: [name] MRN: [id number] Date of Birth: Mar 12, 1946  [facility] CM/SW Contact  Michaela Corner, [place] Phone Number: 10/15/2023, 11:08 AM  Clinical Narrative:   CSW spoke with patients dtr, [name], to discuss PT recs for SNF. [name] stated that she would like her mother to go [facility] or another SNF in [place] (she could not remember the name). CSW will fax referrals, will need to follow up with bed offers.   11:12 AM [name] called CSW and stated first choice is [facility], second choice is [place] in [place], third choice is [facility] in [place].   11:38 AM Requested clinical documentation uploaded for PASRR - number pending at this time.   TOC will continue to follow.    Expected Discharge Plan: Skilled Nursing Facility    Expected Discharge Plan and Services       Living arrangements for the past 2 months: Single Family Home                                       Social Determinants of Health (SDOH) Interventions SDOH Screenings   Food Insecurity: Patient Unable To Answer (10/13/2023)  Housing: Patient Unable To Answer (10/13/2023)  Transportation Needs: Patient Unable To Answer (10/13/2023)  Utilities: Patient Unable To Answer (10/13/2023)  Alcohol Screen: Low Risk  (06/10/2023)  Depression (PHQ2-9): Low Risk  (06/10/2023)  Recent Concern: Depression (PHQ2-9) - High Risk (05/31/2023)  Financial Resource Strain: Low Risk  (06/10/2023)  Physical Activity: Inactive (06/10/2023)  Social Connections: Unknown (10/13/2023)  Recent Concern: Social Connections - Socially Isolated (08/18/2023)  Stress: No Stress Concern Present (06/10/2023)  Tobacco Use: Low Risk  (10/13/2023)  Health Literacy: Inadequate Health Literacy (06/10/2023)    Readmission Risk Interventions    07/01/2023    3:51 PM  Readmission Risk Prevention Plan  Post Dischage Appt  Complete  Medication Screening Complete  Transportation Screening Complete

## 2023-10-15 NOTE — [place] FL2 (Signed)
 [place] MEDICAID FL2 LEVEL OF CARE FORM     IDENTIFICATION  Patient Name: [name] Birthdate: 1946-05-28 Sex: female Admission Date (Current Location): 10/13/2023  [facility] and [place] Number:  Producer, television/film/video and Address:  [facility] [place], [address] [place], [place] [address]      Provider Number: [id number]  Attending Physician Name and Address:  Elgergawy, Leana Roe, MD  Relative Name and Phone Number:       Current Level of Care: Hospital Recommended Level of Care: Skilled Nursing Facility Prior Approval Number:    Date Approved/Denied:   PASRR Number: Pending  Discharge Plan: SNF    Current Diagnoses: Patient Active Problem List   Diagnosis Date Noted   Recurrent seizures (HCC) 10/14/2023   Seizure (HCC) 10/13/2023   Drowsiness 08/24/2023   Seizures (HCC) 08/17/2023   REM sleep behavior disorder    Generalized anxiety disorder    Acute metabolic encephalopathy 07/19/2023   Hypokalemia 07/19/2023   Constipation 07/19/2023   Parkinson's disease    Bilateral sensorineural hearing loss 05/03/2023   Foul smelling urine 02/08/2023   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   Osteoporosis 01/31/2017   GERD (gastroesophageal reflux disease) 08/26/2015   Type 2 diabetes mellitus with hyperglycemia 12/03/2014   DNR (do not resuscitate) 11/23/2012   Dysphagia 12/07/2011   Orthostasis 12/07/2011   Bell's palsy    Syncope 12/06/2011   Agnosia 04/07/2011   Hypersomnia 01/18/2011   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   Morbid obesity 02/16/2007   Mixed hyperlipidemia 01/31/2007   Essential hypertension, benign 01/31/2007   Allergies 11/16/2006    Orientation RESPIRATION BLADDER Height & Weight     Self  Normal Incontinent Weight:   Height:     BEHAVIORAL SYMPTOMS/MOOD NEUROLOGICAL BOWEL NUTRITION STATUS      Continent Diet (see dc summary)  AMBULATORY STATUS COMMUNICATION OF NEEDS Skin    Extensive Assist Verbally Normal                       Personal Care Assistance Level of Assistance  Bathing, Feeding, Dressing Bathing Assistance:  (see dc summary) Feeding assistance:  (see dc summary) Dressing Assistance:  (see dc summary)     Functional Limitations Info  Sight, Hearing, Speech Sight Info: Impaired (Impaired vision) Hearing Info: Adequate Speech Info: Adequate    SPECIAL CARE FACTORS FREQUENCY  PT (By licensed PT), OT (By licensed OT)     PT Frequency: 5x week OT Frequency: 5x week            Contractures Contractures Info: Not present    Additional Factors Info  Code Status, Allergies, Psychotropic Code Status Info: DNR Interven Allergies Info: Codeine Sulfate, Penicillins, Amoxapine And Related, Wellbutrin (Bupropion) Psychotropic Info: clonazePAM (KLONOPIN)         Current Medications (10/15/2023):  This is the current hospital active medication list Current Facility-Administered Medications  Medication Dose Route Frequency Provider Last Rate Last Admin   acetaminophen (TYLENOL) tablet 650 mg  650 mg Oral Q6H PRN Alessandra Bevels, MD       Or   acetaminophen (TYLENOL) suppository 650 mg  650 mg Rectal Q6H PRN Alessandra Bevels, MD       acetaminophen (TYLENOL) tablet 500 mg  500 mg Oral Q6H PRN Kamineni, Neelima, MD       albuterol (PROVENTIL) (2.5 MG/3ML) 0.083% nebulizer solution 2.5 mg  2.5 mg Nebulization Q2H PRN Alessandra Bevels, MD  amLODipine (NORVASC) tablet 10 mg  10 mg Oral Daily Candelaria Stagers T, MD   10 mg at 10/14/23 1136   aspirin EC tablet 81 mg  81 mg Oral Daily Alessandra Bevels, MD   81 mg at 10/14/23 1018   calcium carbonate (TUMS - dosed in mg elemental calcium) chewable tablet 200 mg of elemental calcium  1 tablet Oral Daily PRN Alessandra Bevels, MD       carbidopa-levodopa (SINEMET IR) 25-100 MG per tablet immediate release 3 tablet  3 tablet Oral TID Alessandra Bevels, MD   3 tablet at 10/14/23 2117    cholecalciferol (VITAMIN D3) 25 MCG (1000 UNIT) tablet 1,000 Units  1,000 Units Oral Daily Alessandra Bevels, MD   1,000 Units at 10/14/23 1018   clonazePAM (KLONOPIN) tablet 0.5 mg  0.5 mg Oral QHS Alessandra Bevels, MD   0.5 mg at 10/14/23 2116   donepezil (ARICEPT) tablet 5 mg  5 mg Oral QHS Alessandra Bevels, MD   5 mg at 10/14/23 2117   enoxaparin (LOVENOX) injection 40 mg  40 mg Subcutaneous Q24H Alessandra Bevels, MD   40 mg at 10/14/23 1019   escitalopram (LEXAPRO) tablet 10 mg  10 mg Oral Daily Alessandra Bevels, MD   10 mg at 10/14/23 1019   feeding supplement (ENSURE ENLIVE / ENSURE PLUS) liquid 237 mL  237 mL Oral BID BM Gonfa, Taye T, MD   237 mL at 10/14/23 1452   hydrALAZINE (APRESOLINE) tablet 25 mg  25 mg Oral Q6H PRN Candelaria Stagers T, MD       levETIRAcetam (KEPPRA) tablet 1,000 mg  1,000 mg Oral BID Caryl Pina, MD   1,000 mg at 10/14/23 2117   LORazepam (ATIVAN) injection 2 mg  2 mg Intravenous Once PRN Candelaria Stagers T, MD       memantine (NAMENDA) tablet 10 mg  10 mg Oral BID Alessandra Bevels, MD   10 mg at 10/14/23 2117   mirtazapine (REMERON) tablet 15 mg  15 mg Oral QHS Alessandra Bevels, MD   15 mg at 10/14/23 2116   montelukast (SINGULAIR) tablet 10 mg  10 mg Oral QHS Alessandra Bevels, MD   10 mg at 10/14/23 2116   ondansetron (ZOFRAN) tablet 4 mg  4 mg Oral Q6H PRN Alessandra Bevels, MD       Or   ondansetron (ZOFRAN) injection 4 mg  4 mg Intravenous Q6H PRN Alessandra Bevels, MD       rosuvastatin (CRESTOR) tablet 5 mg  5 mg Oral QPM Alessandra Bevels, MD   5 mg at 10/14/23 1719     Discharge Medications: Please see discharge summary for a list of discharge medications.  Relevant Imaging Results:  Relevant Lab Results:   Additional Information SSN [id number] [place], [place]

## 2023-10-15 NOTE — Progress Notes (Addendum)
 PROGRESS NOTE  [name] ZOX:[id number] DOB: [birth year]   PCP: Doreene Nest, NP  Patient is from: Home.  Lives with daughter.  DOA: 10/13/2023 LOS: 1  Chief complaints Chief Complaint  Patient presents with   Seizures     Brief Narrative / Interim history:  78 year old F with PMH of Parkinson's disease, Bell's palsy, HTN, seizure disorder, anxiety and recent UTI brought to ED by EMS due to seizure-like activity and admitted for the same.  In ED, she had witnessed seizure-like activity.  Teleneurology consulted.  She was loaded with 3 g of IV Keppra.  Neurology recommended admission for 48-hour continuous EEG.  CT head without acute finding.  She was also hypokalemic to 2.9.  Subjective:  No significant overnight as discussed with staff, she is awake this morning, oriented to self only, able to provide any reliable complaints, follows some commands.  Objective: Vitals:   10/14/23 2340 10/15/23 0000 10/15/23 0400 10/15/23 1118  BP: 114/61 (!) 110/58 (!) 134/57   Pulse: 81 88 81   Resp: 16 16 17    Temp: 98 F (36.7 C) 98.3 F (36.8 C) 98.1 F (36.7 C) 98.2 F (36.8 C)  TempSrc: Oral Oral Oral Oral  SpO2: 92% 92% 94%     Examination:  Awake Alert, Oriented to self only, pleasant, no apparent distress, significantly confused.  Mild left facial droop.) at baseline due to known Bell's palsy) Symmetrical Chest wall movement, Good air movement bilaterally, CTAB RRR,No Gallops,Rubs or new Murmurs, No Parasternal Heave +ve B.Sounds, Abd Soft, No tenderness, No rebound - guarding or rigidity. No Cyanosis, Clubbing or edema, No new Rash or bruise     Consultants:  Neurology  Procedures: LTM EEG-suggests mild to moderate diffuse encephalopathy but no seizure or epileptiform discharge.  Microbiology summarized: None  Assessment and plan:  Recurrent seizure like activity: -Management Per neurology, she remains on LTM EEG. -She is on Keppra 750 mg p.o. twice  daily, this has been increased to 1000 mg p.o. twice daily. -Continue  with seizure precautions  Recent diagnosis of UTI -Seen in ED on 3/17.  Urine culture with Aerococcus species.  Started on Cipro.  It is unclear if this is true UTI or bacteria.  UA negative this admission. -Cipro has been discontinued    Uncontrolled hypertension:  -  Not on antihypertensive meds at home.  BP elevated. -Start amlodipine 10 mg daily -P.o. hydralazine as needed  Parkinson's disease: Stable -Continue home Sinemet  Hypokalemia: Hypophosphatemia -replaced, recheck in a.m.  Cognitive impairment/anxiety/mood disorder -Continue home Aricept, memantine, Lexapro and Remeron  Hyperlipidemia -Continue home Crestor  There is no height or weight on file to calculate BMI.          DVT prophylaxis:  enoxaparin (LOVENOX) injection 40 mg Start: 10/14/23 1000  Code Status: DNR/DNI Family Communication: None at bedside Level of care: Telemetry Medical Status is: Inpatient Remains inpatient appropriate because: Recurrent seizure-like activity   Final disposition: Will need SNF placement when stable.   55 minutes with more than 50% spent in reviewing records, counseling patient/family and coordinating care.   Sch Meds:  Scheduled Meds:  amLODipine  10 mg Oral Daily   aspirin EC  81 mg Oral Daily   carbidopa-levodopa  3 tablet Oral TID   cholecalciferol  1,000 Units Oral Daily   clonazePAM  0.5 mg Oral QHS   donepezil  5 mg Oral QHS   enoxaparin (LOVENOX) injection  40 mg Subcutaneous Q24H   escitalopram  10  mg Oral Daily   feeding supplement  237 mL Oral BID BM   levETIRAcetam  1,000 mg Oral BID   memantine  10 mg Oral BID   mirtazapine  15 mg Oral QHS   montelukast  10 mg Oral QHS   rosuvastatin  5 mg Oral QPM   Continuous Infusions: PRN Meds:.acetaminophen **OR** acetaminophen, acetaminophen, albuterol, calcium carbonate, hydrALAZINE, LORazepam, ondansetron **OR** ondansetron (ZOFRAN)  IV  Antimicrobials: Anti-infectives (From admission, onward)    Start     Dose/Rate Route Frequency Ordered Stop   10/14/23 0500  ciprofloxacin (CIPRO) tablet 500 mg  Status:  Discontinued        500 mg Oral Every 12 hours 10/13/23 1727 10/14/23 1119   10/13/23 1700  ciprofloxacin (CIPRO) tablet 500 mg  Status:  Discontinued        500 mg Oral Every 12 hours 10/13/23 1646 10/13/23 1727        I have personally reviewed the following labs and images: CBC: Recent Labs  Lab 10/10/23 1621 10/13/23 1319 10/15/23 0514  WBC 9.6 9.3 12.2*  NEUTROABS 7.9*  --   --   HGB 11.9* 10.8* 10.6*  HCT 37.3 32.9* 32.6*  MCV 90.3 87.0 86.9  PLT 187 212 209   BMP &GFR Recent Labs  Lab 10/10/23 1621 10/13/23 1316 10/13/23 1319 10/14/23 0500 10/14/23 1645 10/15/23 0514  NA 137  --  135 135  --  139  K 3.7  --  2.9* 3.8  --  3.4*  CL 100  --  98 101  --  100  CO2 28  --  27 22  --  27  GLUCOSE 129*  --  117* 111*  --  96  BUN 16  --  7* 9  --  14  CREATININE 0.52 0.56 0.60 0.73  --  0.58  CALCIUM 9.1  --  8.8* 8.9  --  9.3  MG  --   --  1.7  --  1.8 1.8  PHOS  --   --   --   --   --  2.1*   Estimated Creatinine Clearance: 50.6 mL/min (by C-G formula based on SCr of 0.58 mg/dL). Liver & Pancreas: Recent Labs  Lab 10/10/23 1621 10/15/23 0514  AST 7*  --   ALT <5  --   ALKPHOS 69  --   BILITOT 1.0  --   PROT 5.9*  --   ALBUMIN 3.2* 3.3*   No results for input(s): "LIPASE", "AMYLASE" in the last 168 hours. No results for input(s): "AMMONIA" in the last 168 hours. Diabetic: No results for input(s): "HGBA1C" in the last 72 hours. Recent Labs  Lab 10/10/23 1548  GLUCAP 113*   Cardiac Enzymes: No results for input(s): "CKTOTAL", "CKMB", "CKMBINDEX", "TROPONINI" in the last 168 hours. No results for input(s): "PROBNP" in the last 8760 hours. Coagulation Profile: No results for input(s): "INR", "PROTIME" in the last 168 hours. Thyroid Function Tests: No results for  input(s): "TSH", "T4TOTAL", "FREET4", "T3FREE", "THYROIDAB" in the last 72 hours. Lipid Profile: No results for input(s): "CHOL", "HDL", "LDLCALC", "TRIG", "CHOLHDL", "LDLDIRECT" in the last 72 hours. Anemia Panel: No results for input(s): "VITAMINB12", "FOLATE", "FERRITIN", "TIBC", "IRON", "RETICCTPCT" in the last 72 hours. Urine analysis:    Component Value Date/Time   COLORURINE YELLOW 10/14/2023 0905   APPEARANCEUR CLEAR 10/14/2023 0905   LABSPEC >1.030 (H) 10/14/2023 0905   PHURINE 6.0 10/14/2023 0905   GLUCOSEU NEGATIVE 10/14/2023 0905   HGBUR NEGATIVE  10/14/2023 0905   BILIRUBINUR NEGATIVE 10/14/2023 0905   BILIRUBINUR Negative 02/08/2023 1545   KETONESUR >80 (A) 10/14/2023 0905   PROTEINUR NEGATIVE 10/14/2023 0905   UROBILINOGEN 0.2 02/08/2023 1545   UROBILINOGEN 0.2 02/06/2017 1236   NITRITE NEGATIVE 10/14/2023 0905   LEUKOCYTESUR NEGATIVE 10/14/2023 0905   Sepsis Labs: Invalid input(s): "PROCALCITONIN", "LACTICIDVEN"  Microbiology: Recent Results (from the past 240 hours)  Urine Culture     Status: Abnormal   Collection Time: 10/10/23  6:43 PM   Specimen: In/Out Cath Urine  Result Value Ref Range Status   Specimen Description   Final    IN/OUT CATH URINE Performed at [facility], [address], [place], [place] [address]    Special Requests   Final    NONE Performed at [facility], [address] [place], [place] [address]    Culture (A)  Final    >=100,000 COLONIES/mL AEROCOCCUS SANGUINICOLA Standardized susceptibility testing for this organism is not available. Performed at [facility], [address], [place], [place] [address]    Report Status 10/12/2023 FINAL  Final    Radiology Studies: No results found.     Huey Bienenstock MD [facility]  If 7PM-7AM, please contact night-coverage [email or web address] 10/15/2023, 11:25 AM

## 2023-10-15 NOTE — Progress Notes (Addendum)
 OT Cancellation Note  Patient Details Name: ANGELEA PENNY MRN: [id number] DOB: [birth year]   Cancelled Treatment:    Reason Eval/Treat Not Completed: Medical issues which prohibited therapy (Patient's daughter reporting that patient has seizure 10 mins prior to OT entering room  OT to hold and will re-attempt)  Denice Paradise 10/15/2023, 2:04 PM

## 2023-10-16 ENCOUNTER — Inpatient Hospital Stay (HOSPITAL_COMMUNITY)

## 2023-10-16 DIAGNOSIS — R569 Unspecified convulsions: Secondary | ICD-10-CM | POA: Diagnosis not present

## 2023-10-16 LAB — BASIC METABOLIC PANEL
Anion gap: 10 (ref 5–15)
BUN: 18 mg/dL (ref 8–23)
CO2: 24 mmol/L (ref 22–32)
Calcium: 9 mg/dL (ref 8.9–10.3)
Chloride: 101 mmol/L (ref 98–111)
Creatinine, Ser: 0.52 mg/dL (ref 0.44–1.00)
GFR, Estimated: >60 mL/min (ref >60)
Glucose, Bld: 130 mg/dL — ABNORMAL HIGH (ref 70–99)
Potassium: 3.8 mmol/L (ref 3.5–5.1)
Sodium: 135 mmol/L (ref 135–145)

## 2023-10-16 LAB — MAGNESIUM: Magnesium: 1.7 mg/dL (ref 1.7–2.4)

## 2023-10-16 LAB — CBC
HCT: 33.7 % — ABNORMAL LOW (ref 36.0–46.0)
Hemoglobin: 11.3 g/dL — ABNORMAL LOW (ref 12.0–15.0)
MCH: 28.7 pg (ref 26.0–34.0)
MCHC: 33.5 g/dL (ref 30.0–36.0)
MCV: 85.5 fL (ref 80.0–100.0)
Platelets: 256 10*3/uL (ref 150–400)
RBC: 3.94 MIL/uL (ref 3.87–5.11)
RDW: 15.6 % — ABNORMAL HIGH (ref 11.5–15.5)
WBC: 14.3 10*3/uL — ABNORMAL HIGH (ref 4.0–10.5)
nRBC: 0 % (ref 0.0–0.2)

## 2023-10-16 LAB — PHOSPHORUS: Phosphorus: 2.9 mg/dL (ref 2.5–4.6)

## 2023-10-16 NOTE — Progress Notes (Signed)
 OT Cancellation Note  Patient Details Name: [name] MRN: [id number] DOB: 1946/02/14   Cancelled Treatment:    Reason Eval/Treat Not Completed: Other (comment) (Pt to DC home with hospice services/comfort care.)  [facility] 10/16/2023, 2:56 PM Luisa Dago, OT/L   Acute OT Clinical Specialist Acute Rehabilitation Services Pager [phone] Office [phone]

## 2023-10-16 NOTE — Progress Notes (Signed)
 PROGRESS NOTE  [name] ZOX:[id number] DOB: April 24, 1946   PCP: Doreene Nest, NP  Patient is from: Home.  Lives with daughter.  DOA: 10/13/2023 LOS: 2  Chief complaints Chief Complaint  Patient presents with   Seizures     Brief Narrative / Interim history:  78 year old F with PMH of Parkinson's disease, Bell's palsy, HTN, seizure disorder, anxiety and recent UTI brought to ED by EMS due to seizure-like activity and admitted for the same.  In ED, she had witnessed seizure-like activity.  Teleneurology consulted.  She was loaded with 3 g of IV Keppra.  Neurology recommended admission for 48-hour continuous EEG.  CT head without acute finding.  She was also hypokalemic to 2.9.  Her UA was negative, she was just finished treatment for UTI as an outpatient, he was seen by neurology, who connected her to LTM EEG, it was significant for severe encephalopathy, but no evidence of active seizures, her Keppra was increased, with no recurrence of seizures during hospital stay, but overall as discussed with the daughter, patient is bedbound currently, very poor life quality, total dependent, advanced dementia, unable to feed herself, with very poor appetite and oral intake, we have discussed about goals of cares, and decision has been made to proceed with full comfort measures.  Subjective:  No significant events overnight as discussed with staff, no seizures, patient remains with very poor appetite, almost no oral intake other 1 can of Ensure over the last 24 hours  Objective: Vitals:   10/15/23 2000 10/15/23 2334 10/16/23 0415 10/16/23 0912  BP: (!) 133/98 124/65 122/78   Pulse: 96 95 92   Resp: 18 20 18    Temp: 98 F (36.7 C) 97.9 F (36.6 C) 98.1 F (36.7 C) 98.3 F (36.8 C)  TempSrc: Oral Oral Oral Oral  SpO2: 96% 95% 96%     Examination:  She is somnolent, but wakes up on loud verbal stimuli, oriented to self only, pleasant, no apparent distress, significantly  confused.  Mild left facial droop.) at baseline due to known Bell's palsy) Symmetrical Chest wall movement, Good air movement bilaterally, CTAB RRR,No Gallops,Rubs or new Murmurs, No Parasternal Heave +ve B.Sounds, Abd Soft, No tenderness, No rebound - guarding or rigidity. No Cyanosis, Clubbing or edema, No new Rash or bruise      Consultants:  Neurology  Procedures: LTM EEG-suggests mild to moderate diffuse encephalopathy but no seizure or epileptiform discharge.  Microbiology summarized: None  Assessment and plan:  Recurrent seizure like activity: -Management Per neurology, she remains on LTM EEG. -She is on Keppra 750 mg p.o. twice daily, this has been increased to 1000 mg p.o. twice daily. -Continue  with seizure precautions  Recent diagnosis of UTI -Seen in ED on 3/17.  Urine culture with Aerococcus species.  Started on Cipro.  It is unclear if this is true UTI or bacteria.  UA negative this admission. -Cipro has been discontinued    Uncontrolled hypertension:  -  Not on antihypertensive meds at home.  BP elevated. -Start amlodipine 10 mg daily -P.o. hydralazine as needed  Parkinson's disease: Stable -Continue home Sinemet  Hypokalemia: Hypophosphatemia -replaced, recheck in a.m.  Cognitive impairment/anxiety/mood disorder -Continue home Aricept, memantine, Lexapro and Remeron  Hyperlipidemia -Continue home Crestor  There is no height or weight on file to calculate BMI.     Goals of care discussion -See above discussion, she is currently transition to full comfort measures, discussed with daughter, plan to go home with hospice.  DVT prophylaxis:  enoxaparin (LOVENOX) injection 40 mg Start: 10/14/23 1000  Code Status: DNR/DNI Family Communication: Discussed with daughter at bedside Level of care: Telemetry Medical Status is: Inpatient Remains inpatient appropriate because: Recurrent seizure-like activity   Final disposition: Home with  hospice     Sch Meds:  Scheduled Meds:  amLODipine  10 mg Oral Daily   aspirin EC  81 mg Oral Daily   carbidopa-levodopa  3 tablet Oral TID   cholecalciferol  1,000 Units Oral Daily   clonazePAM  0.5 mg Oral QHS   donepezil  5 mg Oral QHS   enoxaparin (LOVENOX) injection  40 mg Subcutaneous Q24H   escitalopram  10 mg Oral Daily   feeding supplement  237 mL Oral BID BM   levETIRAcetam  1,000 mg Oral BID   memantine  10 mg Oral BID   mirtazapine  15 mg Oral QHS   montelukast  10 mg Oral QHS   rosuvastatin  5 mg Oral QPM   Continuous Infusions: PRN Meds:.acetaminophen **OR** acetaminophen, acetaminophen, albuterol, calcium carbonate, hydrALAZINE, LORazepam, ondansetron **OR** ondansetron (ZOFRAN) IV  Antimicrobials: Anti-infectives (From admission, onward)    Start     Dose/Rate Route Frequency Ordered Stop   10/14/23 0500  ciprofloxacin (CIPRO) tablet 500 mg  Status:  Discontinued        500 mg Oral Every 12 hours 10/13/23 1727 10/14/23 1119   10/13/23 1700  ciprofloxacin (CIPRO) tablet 500 mg  Status:  Discontinued        500 mg Oral Every 12 hours 10/13/23 1646 10/13/23 1727        I have personally reviewed the following labs and images: CBC: Recent Labs  Lab 10/10/23 1621 10/13/23 1319 10/15/23 0514 10/16/23 0555  WBC 9.6 9.3 12.2* 14.3*  NEUTROABS 7.9*  --   --   --   HGB 11.9* 10.8* 10.6* 11.3*  HCT 37.3 32.9* 32.6* 33.7*  MCV 90.3 87.0 86.9 85.5  PLT 187 212 209 256   BMP &GFR Recent Labs  Lab 10/10/23 1621 10/13/23 1316 10/13/23 1319 10/14/23 0500 10/14/23 1645 10/15/23 0514 10/16/23 0555  NA 137  --  135 135  --  139 135  K 3.7  --  2.9* 3.8  --  3.4* 3.8  CL 100  --  98 101  --  100 101  CO2 28  --  27 22  --  27 24  GLUCOSE 129*  --  117* 111*  --  96 130*  BUN 16  --  7* 9  --  14 18  CREATININE 0.52 0.56 0.60 0.73  --  0.58 0.52  CALCIUM 9.1  --  8.8* 8.9  --  9.3 9.0  MG  --   --  1.7  --  1.8 1.8 1.7  PHOS  --   --   --   --   --   2.1* 2.9   Estimated Creatinine Clearance: 50.6 mL/min (by C-G formula based on SCr of 0.52 mg/dL). Liver & Pancreas: Recent Labs  Lab 10/10/23 1621 10/15/23 0514  AST 7*  --   ALT <5  --   ALKPHOS 69  --   BILITOT 1.0  --   PROT 5.9*  --   ALBUMIN 3.2* 3.3*   No results for input(s): "LIPASE", "AMYLASE" in the last 168 hours. No results for input(s): "AMMONIA" in the last 168 hours. Diabetic: No results for input(s): "HGBA1C" in the last 72 hours. Recent Labs  Lab 10/10/23  1548  GLUCAP 113*   Cardiac Enzymes: No results for input(s): "CKTOTAL", "CKMB", "CKMBINDEX", "TROPONINI" in the last 168 hours. No results for input(s): "PROBNP" in the last 8760 hours. Coagulation Profile: No results for input(s): "INR", "PROTIME" in the last 168 hours. Thyroid Function Tests: No results for input(s): "TSH", "T4TOTAL", "FREET4", "T3FREE", "THYROIDAB" in the last 72 hours. Lipid Profile: No results for input(s): "CHOL", "HDL", "LDLCALC", "TRIG", "CHOLHDL", "LDLDIRECT" in the last 72 hours. Anemia Panel: No results for input(s): "VITAMINB12", "FOLATE", "FERRITIN", "TIBC", "IRON", "RETICCTPCT" in the last 72 hours. Urine analysis:    Component Value Date/Time   COLORURINE YELLOW 10/14/2023 0905   APPEARANCEUR CLEAR 10/14/2023 0905   LABSPEC >1.030 (H) 10/14/2023 0905   PHURINE 6.0 10/14/2023 0905   GLUCOSEU NEGATIVE 10/14/2023 0905   HGBUR NEGATIVE 10/14/2023 0905   BILIRUBINUR NEGATIVE 10/14/2023 0905   BILIRUBINUR Negative 02/08/2023 1545   KETONESUR >80 (A) 10/14/2023 0905   PROTEINUR NEGATIVE 10/14/2023 0905   UROBILINOGEN 0.2 02/08/2023 1545   UROBILINOGEN 0.2 02/06/2017 1236   NITRITE NEGATIVE 10/14/2023 0905   LEUKOCYTESUR NEGATIVE 10/14/2023 0905   Sepsis Labs: Invalid input(s): "PROCALCITONIN", "LACTICIDVEN"  Microbiology: Recent Results (from the past 240 hours)  Urine Culture     Status: Abnormal   Collection Time: 10/10/23  6:43 PM   Specimen: In/Out Cath  Urine  Result Value Ref Range Status   Specimen Description   Final    IN/OUT CATH URINE Performed at [facility], [address] [place], [place] [address]    Special Requests   Final    NONE Performed at [facility], [address], [place], [place] [address]    Culture (A)  Final    >=100,000 COLONIES/mL AEROCOCCUS SANGUINICOLA Standardized susceptibility testing for this organism is not available. Performed at [facility], [address] [place], [place] [address]    Report Status 10/12/2023 FINAL  Final    Radiology Studies: DG Chest Port 1 View Result Date: 10/16/2023 CLINICAL DATA:  Leukocytosis. EXAM: PORTABLE CHEST 1 VIEW COMPARISON:  06/29/2023 FINDINGS: Low volume rotated film. The cardio pericardial silhouette is enlarged. The lungs are clear without focal pneumonia, edema, pneumothorax or pleural effusion. Tubing from a lap band overlies the left upper quadrant of the abdomen. No acute bony abnormality. IMPRESSION: Cardiomegaly with low lung volumes. No acute cardiopulmonary findings. Electronically Signed   By: Kennith Center M.D.   On: 10/16/2023 07:54       Huey Bienenstock MD [facility]  If 7PM-7AM, please contact night-coverage [email or web address] 10/16/2023, 1:14 PM

## 2023-10-16 NOTE — TOC Progression Note (Signed)
 Transition of Care [facility]) - Progression Note    Patient Details  Name: [name] MRN: [id number] Date of Birth: 1945-08-04  [facility] of [facility]) CM/SW Contact  Lawerance Sabal, RN Phone Number: 10/16/2023, 2:01 PM  Clinical Narrative:     [name] w the daughter, Gaspar Cola, who states that they are interested in home hospice services.  They would like to use [facility] [place]).  Referral placed Patient would like hospital bed and table (through hospice).  Daughter states that she will make a space for the bed tonight, anticipate they will DC her home tomorrow.  Patient will need PTAR for transport, due to mobility. Address confirmed    Reece,Jacqueline (Daughter) [phone]   Expected Discharge Plan: Home w Hospice Care Barriers to Discharge: Continued Medical Work up  Expected Discharge Plan and Services   Discharge Planning Services: CM Consult   Living arrangements for the past 2 months: Single Family Home                             [facility] Agency: [facility] Date [facility] Agency Contacted: 10/16/23 Time HH Agency Contacted: 1401 Representative spoke with at [facility] Agency: Luther Parody   Social Determinants of Health (SDOH) Interventions SDOH Screenings   Food Insecurity: Patient Unable To Answer (10/13/2023)  Housing: Patient Unable To Answer (10/13/2023)  Transportation Needs: Patient Unable To Answer (10/13/2023)  Utilities: Patient Unable To Answer (10/13/2023)  Alcohol Screen: Low Risk  (06/10/2023)  Depression (PHQ2-9): Low Risk  (06/10/2023)  Recent Concern: Depression (PHQ2-9) - High Risk (05/31/2023)  Financial Resource Strain: Low Risk  (06/10/2023)  Physical Activity: Inactive (06/10/2023)  Social Connections: Unknown (10/13/2023)  Recent Concern: Social Connections - Socially Isolated (08/18/2023)  Stress: No Stress Concern Present (06/10/2023)  Tobacco Use: Low Risk  (10/13/2023)  Health Literacy: Inadequate Health Literacy (06/10/2023)     Readmission Risk Interventions    07/01/2023    3:51 PM  Readmission Risk Prevention Plan  Post Dischage Appt Complete  Medication Screening Complete  Transportation Screening Complete

## 2023-10-16 NOTE — Plan of Care (Signed)

## 2023-10-16 NOTE — Progress Notes (Signed)
 [facility] night cross cover note:   I was contacted by pt's RN who conveyed that the patient is comfort care, but does not have a comfort care order at this time.  I subsequently reviewed the most recent rounding hospitalist progress note from today, which conveyed that the patient is now full comfort care measures.   I subsequently added an order for comfort care measures to reconcile this.      Newton Pigg, DO Hospitalist

## 2023-10-17 ENCOUNTER — Telehealth: Payer: Self-pay

## 2023-10-17 ENCOUNTER — Other Ambulatory Visit (HOSPITAL_COMMUNITY): Payer: Self-pay

## 2023-10-17 DIAGNOSIS — Z515 Encounter for palliative care: Secondary | ICD-10-CM

## 2023-10-17 DIAGNOSIS — R569 Unspecified convulsions: Secondary | ICD-10-CM | POA: Diagnosis not present

## 2023-10-17 MED ORDER — MORPHINE SULFATE (CONCENTRATE) 10 MG /0.5 ML PO SOLN
5.0000 mg | ORAL | 0 refills | Status: DC | PRN
  Filled 2023-10-17: qty 10, 5d supply, fill #0

## 2023-10-17 MED ORDER — ONDANSETRON HCL 4 MG PO TABS
4.0000 mg | ORAL_TABLET | Freq: Four times a day (QID) | ORAL | 0 refills | Status: DC | PRN
  Filled 2023-10-17: qty 20, 5d supply, fill #0

## 2023-10-17 MED ORDER — LORAZEPAM 1 MG PO TABS
1.0000 mg | ORAL_TABLET | ORAL | 0 refills | Status: AC | PRN
  Filled 2023-10-17: qty 60, 15d supply, fill #0

## 2023-10-17 MED ORDER — LEVETIRACETAM 1000 MG PO TABS
1000.0000 mg | ORAL_TABLET | Freq: Two times a day (BID) | ORAL | 0 refills | Status: DC
  Filled 2023-10-17: qty 60, 30d supply, fill #0

## 2023-10-17 NOTE — Telephone Encounter (Signed)
 Copied from CRM [id number]. Topic: General - Other >> Oct 17, 2023  9:26 AM Fredrich Romans wrote: Reason for CRM: Heidi Dach from [facility] compassionate care called to see if provider would be the attending for her hospice care? Cb#:[phone] ext [phone]

## 2023-10-17 NOTE — Progress Notes (Signed)
 Pt taken home with hospice by PTAR. Daughter called and made aware. Night time meds given.

## 2023-10-17 NOTE — Telephone Encounter (Signed)
 Called and advised [place] with [employer].

## 2023-10-17 NOTE — Plan of Care (Signed)

## 2023-10-17 NOTE — Discharge Instructions (Signed)
Management per hospice at home.

## 2023-10-17 NOTE — Discharge Summary (Signed)
 Physician Discharge Summary  [name] ZOX:[id number] DOB: 31-Oct-1945 DOA: 10/13/2023  PCP: Doreene Nest, NP  Admit date: 10/13/2023 Discharge date: 10/17/2023  Admitted From: (Home) Disposition:  (home with Hospice)  Recommendations for Outpatient Follow-up:  Management per hospice at home  Brief/Interim Summary:  78 year old F with PMH of Parkinson's disease, Bell's palsy, HTN, seizure disorder, anxiety and recent UTI brought to ED by EMS due to seizure-like activity and admitted for the same.   In ED, she had witnessed seizure-like activity.  Teleneurology consulted.  She was loaded with 3 g of IV Keppra.  Neurology recommended admission for 48-hour continuous EEG.  CT head without acute finding.  She was also hypokalemic to 2.9.  Her UA was negative, she was just finished treatment for UTI as an outpatient, he was seen by neurology, who connected her to LTM EEG, it was significant for severe encephalopathy, but no evidence of active seizures, her Keppra was increased, with no recurrence of seizures during hospital stay, but overall as discussed with the daughter, patient is bedbound currently, very poor life quality, total dependent, advanced dementia, unable to feed herself, with very poor appetite and oral intake, we have discussed about goals of cares, and decision has been made to proceed with full comfort measures.   Recurrent seizure like activity: -He was seen by neurology, was on LTM EEG, his Keppra dose was adjusted, increased to 1000 mg twice daily, with no recurrence of seizures   Recent diagnosis of UTI -Seen in ED on 3/17.  Urine culture with Aerococcus species.  Started on Cipro.  It is unclear if this is true UTI or bacteria.  UA negative this admission. -Cipro has been discontinued    Uncontrolled hypertension:  -  Not on antihypertensive meds at home.  BP elevated.   Parkinson's disease:  -Continue home Sinemet    Hypokalemia: Hypophosphatemia -replaced   Cognitive impairment/anxiety/mood disorder -Continue home  Lexapro and Remeron   Hyperlipidemia -DC Crestor is unnecessary at this point in end-of-life     Goals of care discussion -See above discussion, she is currently transition to full comfort measures, discussed with daughter, plan to go home with hospice.  Discharge Diagnoses:  Principal Problem:   Seizure [facility]) Active Problems:   Recurrent seizures [facility])   Hospice care patient   Palliative care patient    Discharge Instructions  Discharge Instructions     Diet - low sodium heart healthy   Complete by: As directed    Diet - low sodium heart healthy   Complete by: As directed    Discharge instructions   Complete by: As directed    Hospice at home   Increase activity slowly   Complete by: As directed    Increase activity slowly   Complete by: As directed       Allergies as of 10/17/2023       Reactions   Codeine Sulfate    REACTION: hallucinations   Penicillins Anaphylaxis, Swelling, Rash   Tolerates Rocephin   Amoxapine And Related    Wellbutrin [bupropion] Other (See Comments)   Seizures and hallucinations        Medication List     STOP taking these medications    Accu-Chek Guide test strip Generic drug: glucose blood   Accu-Chek Softclix Lancets lancets   aspirin 81 MG tablet   blood glucose meter kit and supplies Kit   cetirizine 10 MG tablet Commonly known as: ZYRTEC   ciprofloxacin 500 MG tablet Commonly  known as: CIPRO   donepezil 5 MG tablet Commonly known as: ARICEPT   memantine 10 MG tablet Commonly known as: NAMENDA   rosuvastatin 5 MG tablet Commonly known as: CRESTOR   Vitamin D3 25 MCG (1000 UT) Caps       TAKE these medications    acetaminophen 500 MG tablet Commonly known as: TYLENOL Take 500 mg by mouth every 6 (six) hours as needed for moderate pain (pain score 4-6) or mild pain (pain score 1-3).   calcium  carbonate 750 MG chewable tablet Commonly known as: TUMS EX Chew 1 tablet by mouth daily as needed for heartburn.   carbidopa-levodopa 25-100 MG tablet Commonly known as: SINEMET IR TAKE 3 TABLETS BY MOUTH 3 TIMES DAILY. What changed: See the new instructions.   clonazePAM 0.5 MG tablet Commonly known as: KLONOPIN TAKE 1/2 TABLET BY MOUTH AT BEDTIME AND 1/2 DAILY AS NEEDED What changed: additional instructions   escitalopram 10 MG tablet Commonly known as: LEXAPRO TAKE 1 TABLET BY MOUTH EVERY DAY What changed: when to take this   levETIRAcetam 1000 MG tablet Commonly known as: KEPPRA Take 1 tablet (1,000 mg total) by mouth 2 (two) times daily. What changed:  medication strength how much to take Another medication with the same name was removed. Continue taking this medication, and follow the directions you see here.   LORazepam 1 MG tablet Commonly known as: ATIVAN Place 1 tablet (1 mg total) under the tongue every 4 (four) hours as needed for up to 10 days for anxiety, seizure or sedation.   mirtazapine 30 MG tablet Commonly known as: REMERON Take 15 mg by mouth at bedtime.   montelukast 10 MG tablet Commonly known as: SINGULAIR TAKE 1 TABLET (10 MG TOTAL) BY MOUTH AT BEDTIME. FOR ALLERGIES   morphine CONCENTRATE 10 mg / 0.5 ml concentrated solution Take 0.25 mLs (5 mg total) by mouth every 3 (three) hours as needed for moderate pain (pain score 4-6) or severe pain (pain score 7-10) (Shortness of breath.).   ondansetron 4 MG tablet Commonly known as: ZOFRAN Take 1 tablet (4 mg total) by mouth every 6 (six) hours as needed for nausea.        Allergies  Allergen Reactions   Codeine Sulfate     REACTION: hallucinations   Penicillins Anaphylaxis, Swelling and Rash    Tolerates Rocephin   Amoxapine And Related    Wellbutrin [Bupropion] Other (See Comments)    Seizures and hallucinations    Consultations: Neurology   Procedures/Studies: DG Chest Port 1  View Result Date: 10/16/2023 CLINICAL DATA:  Leukocytosis. EXAM: PORTABLE CHEST 1 VIEW COMPARISON:  06/29/2023 FINDINGS: Low volume rotated film. The cardio pericardial silhouette is enlarged. The lungs are clear without focal pneumonia, edema, pneumothorax or pleural effusion. Tubing from a lap band overlies the left upper quadrant of the abdomen. No acute bony abnormality. IMPRESSION: Cardiomegaly with low lung volumes. No acute cardiopulmonary findings. Electronically Signed   By: Kennith Center M.D.   On: 10/16/2023 07:54   Overnight EEG with video Result Date: 10/14/2023 Charlsie Quest, MD     10/15/2023  7:08 AM Patient Name: AZAYLAH STAILEY MRN: [id number] Epilepsy Attending: Charlsie Quest Referring Physician/Provider: Erick Blinks, MD Duration: 10/13/2023 1534 to 10/14/2023 1630 Patient history: 78 y.o. female who was brought in after multiple seizures. EEG to evaluate for seizure Level of alertness: Awake, asleep AEDs during EEG study: LEV, Clonazepam Technical aspects: This EEG study was done with scalp electrodes  positioned according to the 10-20 International system of electrode placement. Electrical activity was reviewed with band pass filter of 1-70Hz , sensitivity of 7 uV/mm, display speed of 57mm/sec with a 60Hz  notched filter applied as appropriate. EEG data were recorded continuously and digitally stored.  Video monitoring was available and reviewed as appropriate. Description: The posterior dominant rhythm consists of 8-9 Hz activity of moderate voltage (25-35 uV) seen predominantly in posterior head regions, symmetric and reactive to eye opening and eye closing.  IV Ativan was administered on 08/17/2023 at around 2116.  Subsequently patient fell asleep. Sleep was characterized by vertex waves, sleep symptoms (12 to 14 hours), maximal frontocentral region). EEG also showed continuous generalized 3-5 Hz theta-delta slowing admixed within excessive amount of sharply contoured 15 to 18 Hz  beta activity with irregular morphology distributed symmetrically and diffusely. Hyperventilation and photic stimulation were not performed.   EEG was disconnected between 10/14/2023 1332 to 1421.  ABNORMALITY - Continuous slow, generalized - Excessive beta, generalized  IMPRESSION: This study is suggestive of mild to moderate diffuse encephalopathy. The excessive beta activity seen in the background is most likely due to the effect of benzodiazepine use.   No seizures or epileptiform discharges were seen during the study. Charlsie Quest    CT Head Wo Contrast Result Date: 10/13/2023 CLINICAL DATA:  Mental status change, unknown cause. EXAM: CT HEAD WITHOUT CONTRAST TECHNIQUE: Contiguous axial images were obtained from the base of the skull through the vertex without intravenous contrast. RADIATION DOSE REDUCTION: This exam was performed according to the departmental dose-optimization program which includes automated exposure control, adjustment of the mA and/or kV according to patient size and/or use of iterative reconstruction technique. COMPARISON:  Head MRI 08/18/2023 and CT 08/17/2023 FINDINGS: Brain: There is no evidence of an acute infarct, intracranial hemorrhage, mass, midline shift, or extra-axial fluid collection. Patchy to confluent hypodensities in the cerebral white matter have likely not significantly changed from the prior CT allowing for differences in technique and are nonspecific but compatible with moderate chronic small vessel ischemic disease. Moderately age advanced cerebral atrophy is again noted. Vascular: Calcified atherosclerosis at the skull base. No hyperdense vessel. Skull: No acute fracture or suspicious lesion. Sinuses/Orbits: Mild mucosal thickening in the paranasal sinuses. Clear mastoid air cells. Bilateral cataract extraction. Other: None. IMPRESSION: 1. No evidence of acute intracranial abnormality. 2. Moderate chronic small vessel ischemic disease and cerebral atrophy.  Electronically Signed   By: Sebastian Ache M.D.   On: 10/13/2023 14:54      Subjective: No significant events overnight, she denies any complaints today  Discharge Exam: Vitals:   10/16/23 0912 10/16/23 2000  BP:  111/67  Pulse:  85  Resp:  18  Temp: 98.3 F (36.8 C) 98 F (36.7 C)  SpO2:  94%   Vitals:   10/15/23 2334 10/16/23 0415 10/16/23 0912 10/16/23 2000  BP: 124/65 122/78  111/67  Pulse: 95 92  85  Resp: 20 18  18   Temp: 97.9 F (36.6 C) 98.1 F (36.7 C) 98.3 F (36.8 C) 98 F (36.7 C)  TempSrc: Oral Oral Oral Oral  SpO2: 95% 96%  94%    Somnolent, but wakes up, pleasant, smiling, no apparent distress, baseline mild left facial droop secondary to Bell's palsy Good air entry bilaterally Abdomen soft Extremities with no edema   The results of significant diagnostics from this hospitalization (including imaging, microbiology, ancillary and laboratory) are listed below for reference.     Microbiology: Recent Results (from the  past 240 hours)  Urine Culture     Status: Abnormal   Collection Time: 10/10/23  6:43 PM   Specimen: In/Out Cath Urine  Result Value Ref Range Status   Specimen Description   Final    IN/OUT CATH URINE Performed at [facility], [address], [place], [place] [address]    Special Requests   Final    NONE Performed at [facility], [address] [place], [place] [address]    Culture (A)  Final    >=100,000 COLONIES/mL AEROCOCCUS SANGUINICOLA Standardized susceptibility testing for this organism is not available. Performed at [facility], [address] [place], [place] [address]    Report Status 10/12/2023 FINAL  Final     Labs: BNP (last 3 results) No results for input(s): "BNP" in the last 8760 hours. Basic Metabolic Panel: Recent Labs  Lab 10/10/23 1621 10/13/23 1316 10/13/23 1319 10/14/23 0500 10/14/23 1645 10/15/23 0514 10/16/23 0555  NA 137  --  135 135  --  139  135  K 3.7  --  2.9* 3.8  --  3.4* 3.8  CL 100  --  98 101  --  100 101  CO2 28  --  27 22  --  27 24  GLUCOSE 129*  --  117* 111*  --  96 130*  BUN 16  --  7* 9  --  14 18  CREATININE 0.52 0.56 0.60 0.73  --  0.58 0.52  CALCIUM 9.1  --  8.8* 8.9  --  9.3 9.0  MG  --   --  1.7  --  1.8 1.8 1.7  PHOS  --   --   --   --   --  2.1* 2.9   Liver Function Tests: Recent Labs  Lab 10/10/23 1621 10/15/23 0514  AST 7*  --   ALT <5  --   ALKPHOS 69  --   BILITOT 1.0  --   PROT 5.9*  --   ALBUMIN 3.2* 3.3*   No results for input(s): "LIPASE", "AMYLASE" in the last 168 hours. No results for input(s): "AMMONIA" in the last 168 hours. CBC: Recent Labs  Lab 10/10/23 1621 10/13/23 1319 10/15/23 0514 10/16/23 0555  WBC 9.6 9.3 12.2* 14.3*  NEUTROABS 7.9*  --   --   --   HGB 11.9* 10.8* 10.6* 11.3*  HCT 37.3 32.9* 32.6* 33.7*  MCV 90.3 87.0 86.9 85.5  PLT 187 212 209 256   Cardiac Enzymes: No results for input(s): "CKTOTAL", "CKMB", "CKMBINDEX", "TROPONINI" in the last 168 hours. BNP: Invalid input(s): "POCBNP" CBG: Recent Labs  Lab 10/10/23 1548  GLUCAP 113*   D-Dimer No results for input(s): "DDIMER" in the last 72 hours. Hgb A1c No results for input(s): "HGBA1C" in the last 72 hours. Lipid Profile No results for input(s): "CHOL", "HDL", "LDLCALC", "TRIG", "CHOLHDL", "LDLDIRECT" in the last 72 hours. Thyroid function studies No results for input(s): "TSH", "T4TOTAL", "T3FREE", "THYROIDAB" in the last 72 hours.  Invalid input(s): "FREET3" Anemia work up No results for input(s): "VITAMINB12", "FOLATE", "FERRITIN", "TIBC", "IRON", "RETICCTPCT" in the last 72 hours. Urinalysis    Component Value Date/Time   COLORURINE YELLOW 10/14/2023 0905   APPEARANCEUR CLEAR 10/14/2023 0905   LABSPEC >1.030 (H) 10/14/2023 0905   PHURINE 6.0 10/14/2023 0905   GLUCOSEU NEGATIVE 10/14/2023 0905   HGBUR NEGATIVE 10/14/2023 0905   BILIRUBINUR NEGATIVE 10/14/2023 0905   BILIRUBINUR  Negative 02/08/2023 1545  KETONESUR >80 (A) 10/14/2023 0905   PROTEINUR NEGATIVE 10/14/2023 0905   UROBILINOGEN 0.2 02/08/2023 1545   UROBILINOGEN 0.2 02/06/2017 1236   NITRITE NEGATIVE 10/14/2023 0905   LEUKOCYTESUR NEGATIVE 10/14/2023 0905   Sepsis Labs Recent Labs  Lab 10/10/23 1621 10/13/23 1319 10/15/23 0514 10/16/23 0555  WBC 9.6 9.3 12.2* 14.3*   Microbiology Recent Results (from the past 240 hours)  Urine Culture     Status: Abnormal   Collection Time: 10/10/23  6:43 PM   Specimen: In/Out Cath Urine  Result Value Ref Range Status   Specimen Description   Final    IN/OUT CATH URINE Performed at [facility], [address] [place], [place] [address]    Special Requests   Final    NONE Performed at [facility], [address], [place], [place] [address]    Culture (A)  Final    >=100,000 COLONIES/mL AEROCOCCUS SANGUINICOLA Standardized susceptibility testing for this organism is not available. Performed at [facility], [address], [place], [place] [address]    Report Status 10/12/2023 FINAL  Final     Time coordinating discharge:30 minutes  SIGNED:   Huey Bienenstock, MD  [facility] 10/17/2023, 2:20 PM Pager   If 7PM-7AM, please contact night-coverage [email or web address]

## 2023-10-17 NOTE — Care Management Important Message (Signed)
 Important Message  Patient Details  Name: [name] MRN: [id number] Date of Birth: [birth year]   Important Message Given:  Yes - Medicare IM     Dorena Bodo 10/17/2023, 2:50 PM

## 2023-10-17 NOTE — Plan of Care (Signed)
  Problem: Education: Goal: Knowledge of General Education information will improve Description: Including pain rating scale, medication(s)/side effects and non-pharmacologic comfort measures 10/17/2023 0445 by Azucena Cecil, RN Outcome: Progressing 10/17/2023 0445 by Azucena Cecil, RN Outcome: Progressing   Problem: Health Behavior/Discharge Planning: Goal: Ability to manage health-related needs will improve 10/17/2023 0445 by Azucena Cecil, RN Outcome: Progressing 10/17/2023 0445 by Azucena Cecil, RN Outcome: Progressing   Problem: Clinical Measurements: Goal: Ability to maintain clinical measurements within normal limits will improve 10/17/2023 0445 by Azucena Cecil, RN Outcome: Progressing 10/17/2023 0445 by Azucena Cecil, RN Outcome: Progressing Goal: Will remain free from infection 10/17/2023 0445 by Azucena Cecil, RN Outcome: Progressing 10/17/2023 0445 by Azucena Cecil, RN Outcome: Progressing Goal: Diagnostic test results will improve 10/17/2023 0445 by Azucena Cecil, RN Outcome: Progressing 10/17/2023 0445 by Azucena Cecil, RN Outcome: Progressing Goal: Respiratory complications will improve 10/17/2023 0445 by Azucena Cecil, RN Outcome: Progressing 10/17/2023 0445 by Azucena Cecil, RN Outcome: Progressing Goal: Cardiovascular complication will be avoided 10/17/2023 0445 by Azucena Cecil, RN Outcome: Progressing 10/17/2023 0445 by Azucena Cecil, RN Outcome: Progressing   Problem: Activity: Goal: Risk for activity intolerance will decrease 10/17/2023 0445 by Azucena Cecil, RN Outcome: Progressing 10/17/2023 0445 by Azucena Cecil, RN Outcome: Progressing   Problem: Nutrition: Goal: Adequate nutrition will be maintained 10/17/2023 0445 by Azucena Cecil, RN Outcome: Progressing 10/17/2023 0445 by Azucena Cecil, RN Outcome: Progressing   Problem: Coping: Goal: Level of anxiety will decrease 10/17/2023  0445 by Azucena Cecil, RN Outcome: Progressing 10/17/2023 0445 by Azucena Cecil, RN Outcome: Progressing   Problem: Elimination: Goal: Will not experience complications related to bowel motility 10/17/2023 0445 by Azucena Cecil, RN Outcome: Progressing 10/17/2023 0445 by Azucena Cecil, RN Outcome: Progressing Goal: Will not experience complications related to urinary retention 10/17/2023 0445 by Azucena Cecil, RN Outcome: Progressing 10/17/2023 0445 by Azucena Cecil, RN Outcome: Progressing   Problem: Pain Managment: Goal: General experience of comfort will improve and/or be controlled 10/17/2023 0445 by Azucena Cecil, RN Outcome: Progressing 10/17/2023 0445 by Azucena Cecil, RN Outcome: Progressing   Problem: Safety: Goal: Ability to remain free from injury will improve 10/17/2023 0445 by Azucena Cecil, RN Outcome: Progressing 10/17/2023 0445 by Azucena Cecil, RN Outcome: Progressing   Problem: Skin Integrity: Goal: Risk for impaired skin integrity will decrease 10/17/2023 0445 by Azucena Cecil, RN Outcome: Progressing 10/17/2023 0445 by Azucena Cecil, RN Outcome: Progressing

## 2023-10-17 NOTE — Telephone Encounter (Signed)
 I recommend she establishes with the hospice MD/PA/NP from [facility].

## 2023-10-17 NOTE — TOC Transition Note (Addendum)
 Transition of Care [facility]) - Discharge Note   Patient Details  Name: [name] MRN: [id number] Date of Birth: 1945-08-26  Transition of Care [facility]) CM/SW Contact:  Gordy Clement, RN Phone Number: 10/17/2023, 4:20 PM   Clinical Narrative:    Patient will DC to home this afternoon [facility] has been called.   RNCM has spoken with Patient's Daughter and DME has been delivered. Patients Granddaughter got on the phone and requested that I ask for Crew for truck 26 or crew truck 26. [facility] told me that one of those cres leave at 5 and the other is going out of town.   [facility] notified of DC       Barriers to Discharge: Continued Medical Work up   Patient Goals and CMS Choice Patient states their goals for this hospitalization and ongoing recovery are:: to go to home w hospice CMS Medicare.gov Compare Post Acute Care list provided to:: Other (Comment Required) Choice offered to / list presented to : Adult Children      Discharge Placement                       Discharge Plan and Services Additional resources added to the After Visit Summary for     Discharge Planning Services: CM Consult                        [facility] Agency: [facility] Date [facility] Agency Contacted: 10/16/23 Time HH Agency Contacted: 1401 Representative spoke with at [facility] Agency: Luther Parody  Social Drivers of Health (SDOH) Interventions SDOH Screenings   Food Insecurity: Patient Unable To Answer (10/13/2023)  Housing: Patient Unable To Answer (10/13/2023)  Transportation Needs: Patient Unable To Answer (10/13/2023)  Utilities: Patient Unable To Answer (10/13/2023)  Alcohol Screen: Low Risk  (06/10/2023)  Depression (PHQ2-9): Low Risk  (06/10/2023)  Recent Concern: Depression (PHQ2-9) - High Risk (05/31/2023)  Financial Resource Strain: Low Risk  (06/10/2023)  Physical Activity: Inactive (06/10/2023)  Social Connections: Unknown (10/13/2023)  Recent Concern: Social Connections - Socially  Isolated (08/18/2023)  Stress: No Stress Concern Present (06/10/2023)  Tobacco Use: Low Risk  (10/13/2023)  Health Literacy: Inadequate Health Literacy (06/10/2023)     Readmission Risk Interventions    07/01/2023    3:51 PM  Readmission Risk Prevention Plan  Post Dischage Appt Complete  Medication Screening Complete  Transportation Screening Complete

## 2023-10-31 ENCOUNTER — Telehealth: Payer: Self-pay | Admitting: Primary Care

## 2023-10-31 NOTE — Telephone Encounter (Signed)
 Please call patient's daughter and thank her for the update. [name] was always a joy to care for and we wish her the best.

## 2023-10-31 NOTE — Telephone Encounter (Signed)
 Copied from CRM [id number]. Topic: General - Other >> Oct 31, 2023  1:25 PM Almira Coaster wrote: Reason for CRM: Patient's daughter Rayford Halsted is calling to advise that her mother is in hospice and they have taken over her treatment.

## 2023-11-01 NOTE — Telephone Encounter (Signed)
 Left message on daughters voice mail with information. Requested if anything we can do let our office know. If she has any further questions give [place] a call.

## 2023-11-18 ENCOUNTER — Telehealth: Payer: Self-pay | Admitting: Neurology

## 2023-11-18 ENCOUNTER — Telehealth: Payer: Self-pay

## 2023-11-18 NOTE — Telephone Encounter (Signed)
 Copied from CRM [id number]. Topic: General - Other >> Nov 18, 2023 12:43 PM Rosamond Comes wrote: Reason for CRM: Halford Levels called in stating Patient passed away 26-Nov-2023 in the morning.  I have updated patient chart.

## 2023-11-18 NOTE — Telephone Encounter (Signed)
 Pt's daughter called in to let our office know the pt passed away 12/11/23.

## 2023-11-22 NOTE — Telephone Encounter (Signed)
 Spoke with patient's daughter, Halford Levels, condolences provided.

## 2023-11-24 DEATH — deceased

## 2024-02-21 ENCOUNTER — Ambulatory Visit: Payer: Medicare Other | Admitting: Neurology

## 2024-07-24 ENCOUNTER — Institutional Professional Consult (permissible substitution): Payer: Medicare Other | Admitting: Psychology

## 2024-07-24 ENCOUNTER — Ambulatory Visit: Payer: Self-pay

## 2024-08-14 ENCOUNTER — Encounter: Payer: Medicare Other | Admitting: Psychology
